# Patient Record
Sex: Male | Born: 1950
Health system: Southern US, Community
[De-identification: ages and names within clinical notes are randomized; demographics above are authoritative.]

## PROBLEM LIST (undated history)

## (undated) DIAGNOSIS — Z9189 Other specified personal risk factors, not elsewhere classified: Secondary | ICD-10-CM

## (undated) DIAGNOSIS — R21 Rash and other nonspecific skin eruption: Secondary | ICD-10-CM

## (undated) DIAGNOSIS — Z8546 Personal history of malignant neoplasm of prostate: Secondary | ICD-10-CM

## (undated) DIAGNOSIS — I10 Essential (primary) hypertension: Secondary | ICD-10-CM

## (undated) DIAGNOSIS — D696 Thrombocytopenia, unspecified: Secondary | ICD-10-CM

## (undated) DIAGNOSIS — Z923 Personal history of irradiation: Secondary | ICD-10-CM

## (undated) DIAGNOSIS — C61 Malignant neoplasm of prostate: Secondary | ICD-10-CM

## (undated) DIAGNOSIS — G629 Polyneuropathy, unspecified: Secondary | ICD-10-CM

## (undated) DIAGNOSIS — D509 Iron deficiency anemia, unspecified: Secondary | ICD-10-CM

## (undated) DIAGNOSIS — H409 Unspecified glaucoma: Secondary | ICD-10-CM

## (undated) DIAGNOSIS — K2980 Duodenitis without bleeding: Secondary | ICD-10-CM

## (undated) DIAGNOSIS — F1911 Other psychoactive substance abuse, in remission: Secondary | ICD-10-CM

## (undated) HISTORY — DX: Rash and other nonspecific skin eruption: R21

## (undated) HISTORY — DX: Other specified personal risk factors, not elsewhere classified: Z91.89

## (undated) MED FILL — Fosaprepitant Dimeglumine For IV Infusion 150 MG (Base Eq): INTRAVENOUS | Qty: 5 | Status: AC

## (undated) MED FILL — Dexamethasone Sodium Phosphate Inj 100 MG/10ML: INTRAMUSCULAR | Qty: 1 | Status: AC

---
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
## 2007-10-22 ENCOUNTER — Ambulatory Visit: Payer: Self-pay | Admitting: Cardiology

## 2007-10-22 ENCOUNTER — Inpatient Hospital Stay (HOSPITAL_COMMUNITY): Admission: EM | Admit: 2007-10-22 | Discharge: 2007-10-31 | Payer: Self-pay | Admitting: *Deleted

## 2007-10-24 ENCOUNTER — Encounter (INDEPENDENT_AMBULATORY_CARE_PROVIDER_SITE_OTHER): Payer: Self-pay | Admitting: Internal Medicine

## 2009-10-27 ENCOUNTER — Emergency Department (HOSPITAL_COMMUNITY): Admission: EM | Admit: 2009-10-27 | Discharge: 2009-10-27 | Payer: Self-pay | Admitting: Emergency Medicine

## 2009-11-25 ENCOUNTER — Ambulatory Visit: Payer: Self-pay | Admitting: Family Medicine

## 2009-11-26 ENCOUNTER — Ambulatory Visit (HOSPITAL_COMMUNITY): Admission: RE | Admit: 2009-11-26 | Discharge: 2009-11-26 | Payer: Self-pay | Admitting: Family Medicine

## 2009-12-16 ENCOUNTER — Encounter (INDEPENDENT_AMBULATORY_CARE_PROVIDER_SITE_OTHER): Payer: Self-pay | Admitting: Family Medicine

## 2009-12-16 ENCOUNTER — Ambulatory Visit: Payer: Self-pay | Admitting: Internal Medicine

## 2009-12-16 LAB — CONVERTED CEMR LAB
ALT: 27 units/L (ref 0–53)
Albumin: 4 g/dL (ref 3.5–5.2)
CO2: 28 meq/L (ref 19–32)
Calcium: 9 mg/dL (ref 8.4–10.5)
Glucose, Bld: 97 mg/dL (ref 70–99)
HCT: 42.9 % (ref 39.0–52.0)
HDL: 57 mg/dL (ref >39)
Lymphocytes Relative: 43 % (ref 12–46)
Lymphs Abs: 1.4 10*3/uL (ref 0.7–4.0)
Monocytes Absolute: 0.4 10*3/uL (ref 0.1–1.0)
Neutro Abs: 1.3 10*3/uL — ABNORMAL LOW (ref 1.7–7.7)
Potassium: 4.8 meq/L (ref 3.5–5.3)
RDW: 15.1 % (ref 11.5–15.5)
Total Protein: 6.9 g/dL (ref 6.0–8.3)
Triglycerides: 109 mg/dL (ref <150)

## 2009-12-17 ENCOUNTER — Ambulatory Visit: Payer: Self-pay | Admitting: Internal Medicine

## 2009-12-24 ENCOUNTER — Ambulatory Visit: Payer: Self-pay | Admitting: Internal Medicine

## 2009-12-24 ENCOUNTER — Encounter (INDEPENDENT_AMBULATORY_CARE_PROVIDER_SITE_OTHER): Payer: Self-pay | Admitting: Family Medicine

## 2009-12-24 LAB — CONVERTED CEMR LAB
Basophils Absolute: 0 10*3/uL (ref 0.0–0.1)
MCHC: 32.3 g/dL (ref 30.0–36.0)
MCV: 86.5 fL (ref 78.0–100.0)
Monocytes Relative: 10 % (ref 3–12)
Neutro Abs: 2.5 10*3/uL (ref 1.7–7.7)
Neutrophils Relative %: 50 % (ref 43–77)
PSA: 32.9 ng/mL — ABNORMAL HIGH (ref 0.10–4.00)
Platelets: 330 10*3/uL (ref 150–400)
RDW: 14.7 % (ref 11.5–15.5)
Sed Rate: 2 mm/hr (ref 0–16)

## 2010-01-08 ENCOUNTER — Ambulatory Visit (HOSPITAL_COMMUNITY): Admission: RE | Admit: 2010-01-08 | Discharge: 2010-01-08 | Payer: Self-pay | Admitting: Family Medicine

## 2010-03-25 ENCOUNTER — Ambulatory Visit: Payer: Self-pay | Admitting: Internal Medicine

## 2010-03-25 ENCOUNTER — Encounter (INDEPENDENT_AMBULATORY_CARE_PROVIDER_SITE_OTHER): Payer: Self-pay | Admitting: Family Medicine

## 2010-03-25 LAB — CONVERTED CEMR LAB: PSA: 35.16 ng/mL — ABNORMAL HIGH (ref 0.10–4.00)

## 2010-05-04 ENCOUNTER — Ambulatory Visit: Payer: Self-pay | Admitting: Internal Medicine

## 2010-09-23 ENCOUNTER — Emergency Department (HOSPITAL_COMMUNITY)
Admission: EM | Admit: 2010-09-23 | Discharge: 2010-09-23 | Payer: Self-pay | Source: Home / Self Care | Admitting: Emergency Medicine

## 2011-02-16 NOTE — H&P (Signed)
NAME:  William Jones, William Jones NO.:  [id number]   MEDICAL RECORD NO.:  [id number]          PATIENT TYPE:  INP   LOCATION:  1824                         FACILITY:  [facility]   PHYSICIAN:  Marcellus Scott, MD     DATE OF BIRTH:  13-Apr-1951   DATE OF ADMISSION:  10/22/2007  DATE OF DISCHARGE:                              HISTORY & PHYSICAL   PRIMARY MEDICAL DOCTOR:  Unassigned.   CHIEF COMPLAINT:  Feeling extremely cold, unable to move, numbness of  the feet.   HISTORY OF PRESENT ILLNESS:  William Jones is a 60 year old African-  American male patient with no past medical history, who says that he got  drunk on alcohol and smoked crack cocaine on the 16th of January pm.  He  indicates that he got so drunk that he does not remember anything since  then, until 8:00 a.m. today.  When he woke up today, he thought it was  the 17th of January morning, not realizing that he had missed an entire  day.  The patient found himself on the uncarpeted floor of his kitchen,  feeling extremely cold and unable to move.  He somehow got to a  telephone and called his daughter and asked her to call 911.  When EMS  arrived at his home, the patient was found on the kitchen floor as he  had indicated, feeling extremely cold and shivering but coherent.  The  patient was subsequently bought to the emergency room, where he was  found to be severely hypothermic with a temperature of 92 degrees  Fahrenheit rectally.  The patient was then placed on a bear hugger, warm  IV fluids and warm O2.  At this time, the patient's temperature is 100.6  degrees Fahrenheit.  Then, according to the emergency room physician's  re-evaluation, the patient's limbs are much better than when he came in,  and he is not shivering.  Patient also indicates there was lack of heat  in his house, of unclear duration.   PAST MEDICAL HISTORY:  None.   PAST SURGICAL HISTORY:  None.   PAST PSYCHIATRIC HISTORY:  None.   ALLERGIES:  NONE.   MEDICATIONS:  None.   FAMILY HISTORY:  The patient's brother, age 38 years, on hemodialysis.   SOCIAL HISTORY:  The patient is widowed and has one child who is at the  bedside.  He lives independently at his house.  He claims he smokes 3 to  4 cigarettes per day, since he was 60 years old.  He also indicates that  he drinks half to one bottle of Vodka only once a week.  The last time  he drank was two days ago.  He also smokes crack cocaine, again last one  was Friday of last week.  He denies using IV drugs or any other drugs of  recreational use.   REVIEW OF SYSTEMS:  Fourteen systems reviewed, and apart from history of  presenting illness, patient indicates some numbness in the feet, but  which is improving.  The patient does not know how he sustained  abrasions on his shins and fingers of the  right hand.   PHYSICAL EXAMINATION:  GENERAL:  [name] is a moderately built  and nourished male patient in no obvious distress.  VITAL SIGNS:  Temperature now is 100.6 degrees Fahrenheit, blood  pressure 132/67 mmHg, pulse 92 per minute, respiration 22 per minute  regular, saturating at 98% on O2.  HEAD, EYES, ENT:  Nontraumatic, normocephalic.  Pupils equally reacting  to light and accommodation.  Bilateral immature cataracts.  Oral cavity  without any oropharyngeal erythema.  NECK:  Supple.  No carotid bruits.  LYMPHATICS:  No lymphadenopathy.  RESPIRATORY:  System with occasional rhonchi, both anteriorly and  posteriorly bilaterally, otherwise clear to auscultation.  CARDIOVASCULAR SYSTEM:  First and second heart sounds heard.  No third  or fourth heart sounds.  Short 2/6 systolic murmur heard in all areas.  ABDOMEN:  Nondistended, nontender.  No organomegaly or masses  appreciated.  Bowel sounds are normally heard.  CENTRAL NERVOUS SYSTEM:  The patient is awake, alert, oriented x3.  No  focal neurological deficits.  EXTREMITIES:  Right hand which is swollen  beyond the wrist, with a  slightly reddish hue but no tenderness, normal temperature.  The patient  has swelling of all of his fingers, with superficial ulcers on the  dorsum of his knuckles with scabbing.  There is a question of clubbing  but no cyanosis.  Left hand was mildly swollen but no other  abnormalities.  Bilateral shins with extensive superficial ulcerations  covered by scabs.  Both feet are still mildly cold, the right greater  than the left.  Peripheral pulses are bounding.  Sensation is slightly  decreased on the right foot but returning.  The patient is able to move  both feet and toes.  SKIN:  Apart from the above mentioned, no other abnormalities.  MUSCULOSKELETAL:  Not significant.   LABORATORY DATA:  Comprehensive metabolic panel:  Sodium 144, potassium  4.7, chloride 109, bicarb 17, glucose 74, BUN 41, creatinine 1.87, total  bilirubin of 1.5, total alkaline phosphatase 77, AST 489, ALT 156, total  protein 6.6, albumin 3.6, calcium 8, INR of 1.3, CBC with hemoglobin  15.5, hematocrit 46.2, white blood cell 18.8, platelets 216, point of  care cardiac markers with CK-MB greater than 80, troponin-I 0.07,  myoglobin 107, urine microscopy with 3 to 6 white blood cell per high-  powered field, 7 to 10 per high-powered field red blood cells, a large  amount of blood and 15 ketones.  Chest x-ray:  No abnormality detected, COPD changes.  I have reviewed  the x-rays.  EKGwith normal sinus rhythm at 94 beats per minute with voltage criteria  for left ventricular hypertrophy and tall T-waves/Jwaves V2 to V4.  The  PR interval is 142 and QTC is 469.   ASSESSMENT/PLAN:  1. Severe hypothermia.  Probably multifactorial related to:  lack of      heating at home, alcohol intoxication and cocaine abuse.  Will      admit the patient to telemetry.  Will place bear hugger p.r.n.      Will provide warm O2 and IV fluids.  There is no clinical focus of      sepsis.  Will, however, check  blood culture, urine culture.  Will      also check TSH, urine for toxicology, and alcohol levels.  2. History of alcohol abuse.  Will monitor for withdrawal and place on      alcohol withdrawal protocol, thiamine and multivitamins.  3. Cocaine abuse for counseling.  4. Tobacco abuse.  For counseling and for nicotine patch.  5. Acute renal failure.  This is probably secondary to dehydration and      question rhabdomyolysis-for aggressive IV fluid hydration and      follow up of serial basic metabolic panel.  6. Hepatitis - probably alcoholic hepatitis versus secondary to      rhabdomyolysis.  Will check hepatitis panel and follow serial LFTs.  7. Mild coagulopathy, rule out cirrhosis.  No bleeding, however.  8. Leukocytosis.  Probably stress related and no clinical focus of      sepsis.  Will hold antibiotics and follow CBCs.  9. Question rhabdomyolysis.  Will check total CK and hydrate with IV      fluids and monitor muscle enzymes.  10.Frost bite of b/l toes and right fingers and pernio of both shins-      management per 1 and wound care.      Marcellus Scott, MD  Electronically Signed     AH/MEDQ  D:  10/22/2007  T:  10/22/2007  Job:  [id number]

## 2011-02-16 NOTE — Consult Note (Signed)
NAME:  William Jones, William Jones NO.:  [id number]   MEDICAL RECORD NO.:  [id number]          PATIENT TYPE:  INP   LOCATION:  4729                         FACILITY:  [facility]   PHYSICIAN:  Madlyn Frankel. Charlann Boxer, M.D.  DATE OF BIRTH:  01/31/1951   DATE OF CONSULTATION:  10/23/2007  DATE OF DISCHARGE:                                 CONSULTATION   REASON FOR CONSULTATION:  Evaluate for cold exposure in bilateral lower  extremities.   BRIEF HISTORY:  William Jones is a 60 year old gentleman with no real  known medical history other than the potential for alcohol and illicit  drug use.  He was apparently found by his daughter this week and after  residing in his home without any heat.  Apparently she had not heard  from him in a couple of days and followed up with him and found him  sitting in the house against the wall.  She does not know how long that  had been going on.  He was brought to the emergency room, evaluated and  admitted to the medical service for evaluation and treatment of cold  exposure.   PAST MEDICAL HISTORY:  Review of his past medical history reveals no  significant pathology.   SURGICAL HISTORY:  None reported.   ALLERGIES:  None.   CURRENT MEDICATIONS:  None.   FAMILY HISTORY:  Reports that he has a brother who had coronary artery  disease.   SOCIAL HISTORY:  He is widowed with one child per their report. However,  his daughters is in the room who states that she lives with her mom.  He  lives independently, obviously in an environment that is probably not  the best suitable environment with no heat.  He smokes cigarettes and  drinks vodka and apparently was smoking some crack cocaine.   PHYSICAL EXAMINATION:  GENERAL:  He is awake, alert and oriented.  He is  currently found to be afebrile with stable vital signs.  In the bed.  He  was just finishing a breathing treatment, and though he responded to  questions appropriately and honestly,  was not very  awake.  His daughter  did most of the history recording as best she could ascertain.  He did  not report severe pain, reports intact sensibility in his upper and  lower extremities.   Observation examination indicates that he has multiple areas of  abrasions or eschar over the lower and upper extremities.  He has  bounding pulses palpable bilateral upper and lower extremities.  His  toes on the left are a little bit cold and on the right.  There is no  significant discoloration of skin and definitely no blackness.  No  evidence of any dry or wet gangrene at this point.  He is wearing socks  on initial evaluation.  Examination of the toes was without the socks  obviously. He otherwise was under warm blanket.   Review of his labs indicate that his white count was 18.8 with a  hematocrit of 46.2.  His electrolytes indicated sodium 134, potassium  4.2, BUN 48, creatinine elevated to 2.04 with  a glucose of 201.  He had  markedly elevated CK-MB with a mildly elevated troponin which is  currently being worked up.  It is possible that his CK and CK-MB values  were due to muscle damage due to chronic shivering from cold exposure.   RADIOGRAPHS:  None taken.   ASSESSMENT:  Bilateral lower extremity cold exposure; rule out frost  bite.   PLAN:  At this point he is not having any acute pathology that needs to  be addressed.  I reviewed with his primary care physician the medical  issues to consider which are currently all appropriately taken care of.  As for his feet, there is no acute pathology that needs to be addressed.  Even if there were some delayed progression of some frostbite issues,  not until it is fully demarcated out or some other issues related to  chronic infection or something, would this need to be addressed.  Instead, I reviewed this pathology with the wound care center who  recommended the patient follow up with them for potential demarcation if  there is anything that  begins to change while in the hospital.   Otherwise, management of cold exposure in terms of heated fluids,  maintaining high fluid rate based on his potential burden for muscle  damage, warm socks and blanket covers extremities.  Otherwise, this can  be observed.   I also spoke with the patient's family who live in [place]. They may  be interested in taking him back to [place] for some time to allow him  to be in an environment that may be more suitable and more healthy.  Questions encouraged and answered.  If further questions arise, we can  be contacted. Otherwise follow up in the wound care clinic at [facility].      Madlyn Frankel Charlann Boxer, M.D.  Electronically Signed     MDO/MEDQ  D:  10/23/2007  T:  10/23/2007  Job:  [id number]

## 2011-02-16 NOTE — Discharge Summary (Signed)
NAME:  William Jones, William Jones NO.:  [id number]   MEDICAL RECORD NO.:  [id number]          PATIENT TYPE:  INP   LOCATION:  4737                         FACILITY:  [facility]   PHYSICIAN:  Ladell Pier, M.D.   DATE OF BIRTH:  1951/09/25   DATE OF ADMISSION:  10/22/2007  DATE OF DISCHARGE:  10/30/2007                               DISCHARGE SUMMARY   DISCHARGE DIAGNOSES:  1. Rhabdomyolysis.  2. Acute renal failure.  3. Crack cocaine and alcohol abuse.  4. Elevated troponin.  5. Hypothermia.  6. Abnormal liver function tests/hepatitis.  7. Leukocytosis  8. Frostbitten toes bilateral lower extremity.  9. Thrombocytopenia.  10.Altered mental status.  11.Dehydration.  12.Hyperglycemia.   CONSULTANT:  1. Cardiology secondary to elevated cardiac enzymes.  2. Orthopedic secondary to frostbitten toes.  3. Wound care secondary to frostbitten toes.   PROCEDURES:  None.   HISTORY OF PRESENT ILLNESS:  The patient is a 60 year old African  American male without any prior medical history except polysubstance  abuse.  He was using crack cocaine and alcohol on January 16, woke on  the morning of the 18th.  No recollection of January 17.  He woke up  feeling extremely cold and unable to move.  He somehow got to a  telephone and called his daughter, and she then call 9-1-1.  When EMS  arrived, the patient was on the floor.  He was taken to [facility] ED  where his core temperature was 92, was treated a bear hugger, warm IV  fluids and warm oxygen.  He had a marked elevation in CK and MB 57,879,  MB 533, mild elevation of troponin, was treated for rhabdo and  hypothermia along with acute renal failure aggressively with fluids.  Please see admission note for remainder of history, past medical  history, family history, social history, medications, allergies, review  of systems, per admission H&P.   PHYSICAL EXAMINATION ON DISCHARGE:  Temperature 98.7, pulse 88,  respirations 18,  blood pressure 115/79, pulse oxygen 94% on room air.  CBG 109-120.  HEENT: Normocephalic, atraumatic.  Pupils reactive to light, throat  without erythema.  CARDIOVASCULAR:  Regular rate and rhythm.  LUNGS:  Clear bilaterally.  ABDOMEN:  Positive bowel sounds.  EXTREMITIES:  1+ edema.  He has frost dark, purple-looking toes from  frostbite with some area that is excoriated.   HOSPITAL COURSE:  1. Hypothermia:  The patient was admitted with a bear hugger.      Temperature improved.  2. Altered mental status:  Altered mental status is multifactorial.      With the treatment of his hypothermia and his rhabdo, his mental      status improved, and he was able to answer questions appropriately      over the few days in the hospital.  3. Rhabdomyolysis:  He was treated with IV fluids.  Over the hospital      course, his CK levels trended down to less than 2000.  He will      follow up with [facility] to continue to monitor his      CKs.  4. Abnormal  LFTs:  His liver function was also very elevated and also      continued to trend down.  His levels have gotten pretty low, and he      will follow up outpatient to recheck them in the next few weeks.  5. Elevated cardiac enzymes:  This is also secondary to him being down      in the cold.  His apartment did not have any heat.  Not sure how      long he was down for.  Cardiology saw him and did not think he      needed any further cardiac workup.  6. Frostbitten toes:  His toes were frostbitten.  Ortho was consulted      and did not think there was anything to be done.  Recommend wound      care clinic followup.  7. Disposition:  The patient has nowhere to go.  Care manager will      arrange for the patient to go to a shelter.  He has an appointment      to follow up with the wound care center, and he will also follow up      with [facility].  He will call [facility] for appointment.   DISCHARGE LABS:  CK 1179, MB 4.5, relative  index 0.4, troponin 0.07.  LFTs:  AST 100, ALT 159, sodium 134, potassium 3.5, chloride 102, CO2  25, glucose 108, BUN 11, creatinine 0.86.  CT of the head showed normal  brain for age.      Ladell Pier, M.D.  Electronically Signed     NJ/MEDQ  D:  10/30/2007  T:  10/30/2007  Job:  [id number]   cc:   [facility]  [facility]

## 2011-02-16 NOTE — Consult Note (Signed)
NAME:  William Jones, RAMUS NO.:  [id number]   MEDICAL RECORD NO.:  [id number]          PATIENT TYPE:  INP   LOCATION:  4729                         FACILITY:  [facility]   PHYSICIAN:  Jesse Sans. Wall, MD, FACCDATE OF BIRTH:  November 23, 1950   DATE OF CONSULTATION:  10/23/2007  DATE OF DISCHARGE:                                 CONSULTATION   PRIMARY CARDIOLOGIST:  New to [facility] cardiology, being seen by Dr.  Daleen Squibb.   PRIMARY Kohle Winner:  The patient has no primary providers, is on the  [employer].   PATIENT PROFILE:  A 60 year old African male without prior cardiac  history by report, who was admitted in the setting of hypothermia with  EtOH and crack cocaine abuse and subsequently noted to have a  rhabdomyolysis, acute renal failure and elevated cardiac markers.   PROBLEM LIST:  1. Hypothermia.  2. Rhabdomyolysis.  3. Elevated troponin.  4. Cocaine abuse.  5. Tobacco abuse.  6. EtOH abuse.  7. Acute renal failure in the setting of rhabdomyolysis.  8. Hepatitis.  9. Leukocytosis.   ALLERGIES:  No known drug allergies.   HISTORY OF PRESENT ILLNESS:  A 60 year old African male without prior  medical history with the exception of polysubstance abuse.  He  apparently used crack cocaine and alcohol on January 16 and awoke on the  morning of January 18 (the patient has no recollection of January 17)  feeling extremely cold and unable to move.  He somehow got to a  telephone and called his daughter and she then called 9-1-1.  When EMS  arrived, the patient was on his occasion floor and was shivering but  coherent.  Apparently there was no heat in the home.  He was taken to  the [facility] ED, where his core temperature was 92 degrees Fahrenheit  and he was treated with the [facility], warm IV fluids and warm O2.  He  has subsequently been noted to have marked elevations in CKs and MBs to  a peak of 57,879 and 533.2, respectively, with an index of less than  1  and mild elevations of troponins to a peak of 58.79.  He has been treated  for rhabdomyolysis and hypothermia along with acute renal failure with  aggressive fluid hydration.  He is currently on DT prophylaxis secondary  to his alcoholism and is somewhat sedated.  He does open his eyes and  answer simple yes-no questions but nods off fairly quickly.  He denies  experiencing any pain at this time..   CURRENT MEDICATIONS:  1. Albuterol inhaler q.6h.  2. Atrovent inhaler q.6h.  3. Lorazepam 2 mg IV q.8h.  4. Multivitamin daily.  5. Nicotine patch 14 mg daily.  6. Thiamine 100 mg daily.   FAMILY HISTORY:  Mother has a history hypertension.  Father died of an  MI in his 22s.  He has a brother in his 64s who has a history renal  failure on dialysis.   SOCIAL HISTORY:  He lives alone in [place].  He is unemployed.  He is  apparently widowed.  He does have one daughter.  He  has a history of  EtOH, crack cocaine and tobacco abuse.   REVIEW OF SYSTEMS:  The patient is sedated 58 year old daughter and unable to answer these  questions, and this should be noted that the family history and social  history were obtained from the patient's 34 year old daughter.   PHYSICAL EXAM:  Temperature 99.2, heart rate 107, respirations 21, blood  pressure 108/66, pulse oximetry 97% on 2 L.  He weighed 63.3 kg.  A pleasant African American male in no acute distress.  He is somnolent  and opens his eyes to answer yes-no questions but then falls back to  sleep.  HEENT:  Normal.  SKIN:  He has multiple areas of what appears to be necrosis over his  fingers and hands bilaterally with significant hand swelling.  He has  similar lesions on the anterior aspects of his ankles and feet, felt to  be associated with his frostbite.  NECK:  No bruits or JVD.  LUNGS:  Respirations regular are unlabored.  Clear to auscultation.  CARDIAC:  Regular, S1, S2, no S3, S4 or murmurs.  ABDOMEN:  Flat, soft, nontender.  Bowel sounds present  x4.  EXTREMITIES:  Warm, dry.  No clubbing, cyanosis, with edema as outlined  above.   Chest x-ray shows no active lung disease, probable COPD.  EKG shows  sinus rhythm, rate of 94 beats per minute.  He appears have LVH and  possible Osborne waves in V4, V5 and V6.   Lab work:  Hemoglobin 15.5, hematocrit 46.2, WBC 18.8, platelets 216.  Sodium 34, potassium 4.2, chloride 107, CO2 19, BUN 48, creatinine 2.04,  glucose 201, total bilirubin 1.0, alkaline phosphatase 68, AST 790, ALT  230, total protein 6.0, albumin 3.0, calcium 7.5.  Phosphorus 4.4.  Magnesium 2.4.  CK 57,879, MB 532.2, troponin-I 0.34.  Urine culture is  pending.   ASSESSMENT AND PLAN:  1. Troponin elevation.  This has occurred in the setting of      rhabdomyolysis with acute renal failure and crack cocaine and      ethanol abuse.  His CKs and MBs are markedly elevated; however, his      index is normal and at this point we doubt ischemia.  We will check      a 2-D echocardiogram to evaluate left ventricular function and wall      motion.  Provided that echo is normal, we would not pursue      additional cardiac evaluation at this time.  2. Polysubstance abuse.  Cessation advised.  When the patient is more      awake and alert, he will require additional counseling.  3. Rhabdomyolysis.  Per primary team.  4. Hypothermia.  Per primary team.  5. Acute renal failure, likely secondary to rhabdomyolysis.  The      patient is being hydrated.      Nicolasa Ducking, ANP      Jesse Sans. Daleen Squibb, MD, [facility]  Electronically Signed    CB/MEDQ  D:  10/23/2007  T:  10/24/2007  Job:  [id number]

## 2011-06-25 LAB — CBC
HCT: 31.7 — ABNORMAL LOW
HCT: 46.2
Hemoglobin: 10.7 — ABNORMAL LOW
Hemoglobin: 11.5 — ABNORMAL LOW
Hemoglobin: 12.1 — ABNORMAL LOW
Hemoglobin: 15.5
MCHC: 33.3
MCHC: 33.6
MCV: 83.9
MCV: 84.3
Platelets: 216
Platelets: 238
Platelets: 303
RBC: 4.12 — ABNORMAL LOW
RBC: 4.24
RBC: 4.73
RBC: 5.5
RDW: 14.8
RDW: 14.9
WBC: 18.8 — ABNORMAL HIGH
WBC: 6.4
WBC: 7.9
WBC: 8.8

## 2011-06-25 LAB — COMPREHENSIVE METABOLIC PANEL WITH GFR
ALT: 230 — ABNORMAL HIGH
AST: 790 — ABNORMAL HIGH
Albumin: 3 — ABNORMAL LOW
Alkaline Phosphatase: 68
BUN: 48 — ABNORMAL HIGH
CO2: 19
Calcium: 7.6 — ABNORMAL LOW
Chloride: 107
Creatinine, Ser: 2.04 — ABNORMAL HIGH
GFR calc non Af Amer: 34 — ABNORMAL LOW
Glucose, Bld: 201 — ABNORMAL HIGH
Potassium: 4.2
Sodium: 134 — ABNORMAL LOW
Total Bilirubin: 1
Total Protein: 6

## 2011-06-25 LAB — URINE MICROSCOPIC-ADD ON

## 2011-06-25 LAB — DIFFERENTIAL
Basophils Absolute: 0
Basophils Relative: 0
Eosinophils Absolute: 0
Eosinophils Relative: 0
Lymphocytes Relative: 4 — ABNORMAL LOW
Lymphs Abs: 0.8
Monocytes Absolute: 1.4 — ABNORMAL HIGH
Monocytes Relative: 7
Neutro Abs: 16.6 — ABNORMAL HIGH
Neutrophils Relative %: 88 — ABNORMAL HIGH

## 2011-06-25 LAB — CARDIAC PANEL(CRET KIN+CKTOT+MB+TROPI)
CK, MB: 19 — ABNORMAL HIGH
CK, MB: 315.2 — ABNORMAL HIGH
CK, MB: 533.2 — ABNORMAL HIGH
CK, MB: 61.2 — ABNORMAL HIGH
Relative Index: 0.1
Relative Index: 0.2
Relative Index: 0.4
Total CK: 2614 — ABNORMAL HIGH
Total CK: 30012 — ABNORMAL HIGH
Total CK: 57405 — ABNORMAL HIGH
Total CK: 57879 — ABNORMAL HIGH
Troponin I: 0.04
Troponin I: 0.07 — ABNORMAL HIGH

## 2011-06-25 LAB — BASIC METABOLIC PANEL
BUN: 29 — ABNORMAL HIGH
BUN: 8
CO2: 27
CO2: 28
Calcium: 5.7 — CL
Calcium: 6 — CL
Calcium: 7.7 — ABNORMAL LOW
Calcium: 8.2 — ABNORMAL LOW
Chloride: 100
Chloride: 100
Chloride: 103
Chloride: 77 — CL
Chloride: 81 — ABNORMAL LOW
Creatinine, Ser: 0.84
Creatinine, Ser: 0.87
Creatinine, Ser: 0.91
GFR calc Af Amer: >60
GFR calc Af Amer: >60
GFR calc Af Amer: >60
GFR calc non Af Amer: >60
Glucose, Bld: 139 — ABNORMAL HIGH
Glucose, Bld: 208 — ABNORMAL HIGH
Glucose, Bld: 212 — ABNORMAL HIGH
Potassium: 3.9
Potassium: 4
Potassium: 4.1
Sodium: 131 — ABNORMAL LOW
Sodium: 131 — ABNORMAL LOW
Sodium: 132 — ABNORMAL LOW
Sodium: 135

## 2011-06-25 LAB — URINE CULTURE: Colony Count: NO GROWTH

## 2011-06-25 LAB — COMPREHENSIVE METABOLIC PANEL
ALT: 237 — ABNORMAL HIGH
ALT: 240 — ABNORMAL HIGH
ALT: 247 — ABNORMAL HIGH
ALT: 269 — ABNORMAL HIGH
ALT: 292 — ABNORMAL HIGH
AST: 470 — ABNORMAL HIGH
AST: 634 — ABNORMAL HIGH
AST: 647 — ABNORMAL HIGH
Alkaline Phosphatase: 65
Alkaline Phosphatase: 77
Alkaline Phosphatase: 82
Alkaline Phosphatase: 84
BUN: 11
BUN: 41 — ABNORMAL HIGH
CO2: 25
CO2: 25
CO2: 29
CO2: 30
Calcium: 8.8
Chloride: 101
Chloride: 98
Chloride: 99
Creatinine, Ser: 1.03
GFR calc Af Amer: 46 — ABNORMAL LOW
GFR calc Af Amer: >60
GFR calc Af Amer: >60
GFR calc Af Amer: >60
GFR calc non Af Amer: >60
GFR calc non Af Amer: >60
GFR calc non Af Amer: >60
GFR calc non Af Amer: >60
Glucose, Bld: 103 — ABNORMAL HIGH
Glucose, Bld: 108 — ABNORMAL HIGH
Glucose, Bld: 74
Glucose, Bld: 96
Potassium: 3.5
Potassium: 4.3
Potassium: 4.5
Potassium: 4.7
Sodium: 134 — ABNORMAL LOW
Sodium: 135
Sodium: 135
Sodium: 137
Total Bilirubin: 0.6
Total Bilirubin: 1
Total Bilirubin: 1.1
Total Protein: 6.3
Total Protein: 6.3
Total Protein: 6.6

## 2011-06-25 LAB — URINALYSIS, ROUTINE W REFLEX MICROSCOPIC
Bilirubin Urine: NEGATIVE
Glucose, UA: NEGATIVE
Ketones, ur: 15 — AB
Leukocytes, UA: NEGATIVE
Nitrite: NEGATIVE
Protein, ur: 100 — AB
Specific Gravity, Urine: 1.02
Urobilinogen, UA: 1
pH: 5.5

## 2011-06-25 LAB — APTT: aPTT: 33

## 2011-06-25 LAB — PROTIME-INR
INR: 1
INR: 1.3
Prothrombin Time: 16.5 — ABNORMAL HIGH

## 2011-06-25 LAB — MAGNESIUM: Magnesium: 2.4

## 2011-06-25 LAB — CK TOTAL AND CKMB (NOT AT ARMC): Relative Index: 1.4

## 2011-06-25 LAB — CK: Total CK: 28236 — ABNORMAL HIGH

## 2011-06-25 LAB — PHOSPHORUS
Phosphorus: 1.1 — ABNORMAL LOW
Phosphorus: 4.4

## 2011-06-25 LAB — HEPATIC FUNCTION PANEL
ALT: 159 — ABNORMAL HIGH
AST: 100 — ABNORMAL HIGH
Albumin: 2 — ABNORMAL LOW
Alkaline Phosphatase: 77
Total Protein: 6.4

## 2011-06-25 LAB — POCT CARDIAC MARKERS
CKMB, poc: >80
Myoglobin, poc: 107
Operator id: 196461
Troponin i, poc: 0.07 — ABNORMAL HIGH

## 2011-06-25 LAB — CALCIUM: Calcium: 7.5 — ABNORMAL LOW

## 2011-06-25 LAB — LACTIC ACID, PLASMA: Lactic Acid, Venous: 0.9

## 2011-11-30 ENCOUNTER — Encounter: Payer: Self-pay | Admitting: Vascular Surgery

## 2011-12-01 ENCOUNTER — Encounter (INDEPENDENT_AMBULATORY_CARE_PROVIDER_SITE_OTHER): Payer: Medicaid Other | Admitting: *Deleted

## 2011-12-01 ENCOUNTER — Ambulatory Visit (INDEPENDENT_AMBULATORY_CARE_PROVIDER_SITE_OTHER): Payer: Medicaid Other | Admitting: Vascular Surgery

## 2011-12-01 ENCOUNTER — Encounter: Payer: Self-pay | Admitting: Vascular Surgery

## 2011-12-01 VITALS — BP 112/74 | HR 68 | Resp 16 | Ht 69.0 in | Wt 154.0 lb

## 2011-12-01 DIAGNOSIS — R0989 Other specified symptoms and signs involving the circulatory and respiratory systems: Secondary | ICD-10-CM

## 2011-12-01 DIAGNOSIS — M79673 Pain in unspecified foot: Secondary | ICD-10-CM

## 2011-12-01 DIAGNOSIS — M79609 Pain in unspecified limb: Secondary | ICD-10-CM

## 2011-12-01 NOTE — Progress Notes (Signed)
[facility]   Patient name: William Jones MRN: [id number] DOB: December 14, 1950 Sex: male  REASON FOR CONSULT: bilateral foot pain. Referred by Clarisa Fling  HPI: William Jones is a 61 y.o. male who is had a long history of pain in both feet. This likely began when he developed frostbite in 2009. He had lost to heat at his home and on a very cold night experienced a frostbite of both feet. He said fairly constant throbbing pain in his feet since that time. He states there really no aggravating or alleviating factors. I do not get any history of claudication, rest pain, or nonhealing wounds. The pain is most significant in his left first and second toes but he has pain in both feet.  Past Medical History  Diagnosis Date  . History of frostbite     History reviewed. No pertinent family history.  SOCIAL HISTORY: History  Substance Use Topics  . Smoking status: Former Smoker    Types: Cigarettes    Quit date: 10/05/2007  . Smokeless tobacco: Not on file  . Alcohol Use: No    No Known Allergies  Current Outpatient Prescriptions  Medication Sig Dispense Refill  . gabapentin (NEURONTIN) 300 MG capsule Take 300 mg by mouth 3 (three) times daily.        REVIEW OF SYSTEMS: Arly.Keller ] denotes positive finding; [  ] denotes negative finding  CARDIOVASCULAR:  [ ]  chest pain   [ ]  chest pressure   [ ]  palpitations   [ ]  orthopnea   [ ]  dyspnea on exertion   [ ]  claudication   [ ]  rest pain   [ ]  DVT   [ ]  phlebitis PULMONARY:   [ ]  productive cough   [ ]  asthma   [ ]  wheezing NEUROLOGIC:   [ ]  weakness  [ ]  paresthesias  [ ]  aphasia  [ ]  amaurosis  [ ]  dizziness HEMATOLOGIC:   [ ]  bleeding problems   [ ]  clotting disorders MUSCULOSKELETAL:  [ ]  joint pain   [ ]  joint swelling [ ]  leg swelling GASTROINTESTINAL: [ ]   blood in stool  [ ]   hematemesis GENITOURINARY:  [ ]   dysuria  [ ]   hematuria PSYCHIATRIC:  [ ]  history of major depression INTEGUMENTARY:  [  ] rashes  [ ]  ulcers CONSTITUTIONAL:  [ ]  fever   [ ]  chills  PHYSICAL EXAM: Filed Vitals:   12/01/11 1122  BP: 112/74  Pulse: 68  Resp: 16  Height: 5\' 9"  (1.753 m)  Weight: 154 lb (69.854 kg)  SpO2: 100%   Body mass index is 22.74 kg/(m^2). GENERAL: The patient is a well-nourished male, in no acute distress. The vital signs are documented above. CARDIOVASCULAR: There is a regular rate and rhythm without significant murmur appreciated. I do not detect carotid bruits. He has palpable femoral, popliteal, and dorsalis pedis pulses bilaterally. He has no significant lower extremity swelling. PULMONARY: There is good air exchange bilaterally without wheezing or rales. ABDOMEN: Soft and non-tender with normal pitched bowel sounds. I cannot palpate an abdominal aortic aneurysm. MUSCULOSKELETAL: There are no major deformities or cyanosis. NEUROLOGIC: No focal weakness or paresthesias are detected. SKIN: There are no ulcers or rashes noted. PSYCHIATRIC: The patient has a normal affect.  DATA:  I have independently interpreted his arterial Doppler study which shows triphasic Doppler signals in the dorsalis pedis and posterior tibial positions bilaterally. He has an ABI of over 100% bilaterally.  MEDICAL ISSUES:  I reassured him that by exam and by his Doppler study he has no evidence of significant peripheral arterial disease. I think he has neuropathy in both feet related to his frostbite. He is on Neurontin which hopefully will help. I'll be happy to see him back at any time if any new vascular issues arise.   William Jones S [facility] Beeper: [phone]

## 2013-07-16 ENCOUNTER — Other Ambulatory Visit: Payer: Self-pay

## 2013-07-16 DIAGNOSIS — M792 Neuralgia and neuritis, unspecified: Secondary | ICD-10-CM

## 2013-07-16 NOTE — Telephone Encounter (Signed)
Refill Gabapentin request.

## 2013-07-17 ENCOUNTER — Other Ambulatory Visit: Payer: Self-pay

## 2013-07-17 DIAGNOSIS — M792 Neuralgia and neuritis, unspecified: Secondary | ICD-10-CM

## 2013-07-17 MED ORDER — GABAPENTIN 300 MG PO CAPS: 300.0000 mg | ORAL_CAPSULE | Freq: Three times a day (TID) | ORAL | Status: DC

## 2013-09-14 ENCOUNTER — Ambulatory Visit: Payer: Self-pay

## 2013-10-05 ENCOUNTER — Ambulatory Visit: Payer: Self-pay

## 2013-10-19 ENCOUNTER — Ambulatory Visit: Payer: Self-pay

## 2013-11-06 ENCOUNTER — Ambulatory Visit (INDEPENDENT_AMBULATORY_CARE_PROVIDER_SITE_OTHER): Payer: Medicare Other

## 2013-11-06 VITALS — BP 119/64 | HR 112 | Resp 18

## 2013-11-06 DIAGNOSIS — E1149 Type 2 diabetes mellitus with other diabetic neurological complication: Secondary | ICD-10-CM

## 2013-11-06 DIAGNOSIS — B351 Tinea unguium: Secondary | ICD-10-CM

## 2013-11-06 DIAGNOSIS — E1140 Type 2 diabetes mellitus with diabetic neuropathy, unspecified: Secondary | ICD-10-CM

## 2013-11-06 DIAGNOSIS — Q828 Other specified congenital malformations of skin: Secondary | ICD-10-CM | POA: Diagnosis not present

## 2013-11-06 DIAGNOSIS — E1142 Type 2 diabetes mellitus with diabetic polyneuropathy: Secondary | ICD-10-CM

## 2013-11-06 DIAGNOSIS — M79609 Pain in unspecified limb: Secondary | ICD-10-CM | POA: Diagnosis not present

## 2013-11-06 NOTE — Patient Instructions (Signed)
Diabetes and Foot Care Diabetes may cause you to have problems because of poor blood supply (circulation) to your feet and legs. This may cause the skin on your feet to become thinner, break easier, and heal more slowly. Your skin may become dry, and the skin may peel and crack. You may also have nerve damage in your legs and feet causing decreased feeling in them. You may not notice minor injuries to your feet that could lead to infections or more serious problems. Taking care of your feet is one of the most important things you can do for yourself.  HOME CARE INSTRUCTIONS  Wear shoes at all times, even in the house. Do not go barefoot. Bare feet are easily injured.  Check your feet daily for blisters, cuts, and redness. If you cannot see the bottom of your feet, use a mirror or ask someone for help.  Wash your feet with warm water (do not use hot water) and mild soap. Then pat your feet and the areas between your toes until they are completely dry. Do not soak your feet as this can dry your skin.  Apply a moisturizing lotion or petroleum jelly (that does not contain alcohol and is unscented) to the skin on your feet and to dry, brittle toenails. Do not apply lotion between your toes.  Trim your toenails straight across. Do not dig under them or around the cuticle. File the edges of your nails with an emery board or nail file.  Do not cut corns or calluses or try to remove them with medicine.  Wear clean socks or stockings every day. Make sure they are not too tight. Do not wear knee-high stockings since they may decrease blood flow to your legs.  Wear shoes that fit properly and have enough cushioning. To break in new shoes, wear them for just a few hours a day. This prevents you from injuring your feet. Always look in your shoes before you put them on to be sure there are no objects inside.  Do not cross your legs. This may decrease the blood flow to your feet.  If you find a minor scrape,  cut, or break in the skin on your feet, keep it and the skin around it clean and dry. These areas may be cleansed with mild soap and water. Do not cleanse the area with peroxide, alcohol, or iodine.  When you remove an adhesive bandage, be sure not to damage the skin around it.  If you have a wound, look at it several times a day to make sure it is healing.  Do not use heating pads or hot water bottles. They may burn your skin. If you have lost feeling in your feet or legs, you may not know it is happening until it is too late.  Make sure your health care provider performs a complete foot exam at least annually or more often if you have foot problems. Report any cuts, sores, or bruises to your health care provider immediately. SEEK MEDICAL CARE IF:   You have an injury that is not healing.  You have cuts or breaks in the skin.  You have an ingrown nail.  You notice redness on your legs or feet.  You feel burning or tingling in your legs or feet.  You have pain or cramps in your legs and feet.  Your legs or feet are numb.  Your feet always feel cold. SEEK IMMEDIATE MEDICAL CARE IF:   There is increasing redness,   swelling, or pain in or around a wound.  There is a red line that goes up your leg.  Pus is coming from a wound.  You develop a fever or as directed by your health care provider.  You notice a bad smell coming from an ulcer or wound. Document Released: 09/17/2000 Document Revised: 05/23/2013 Document Reviewed: 02/27/2013 [employer] Patient Information 2014 [employer].  

## 2013-11-06 NOTE — Progress Notes (Signed)
   Subjective:    Patient ID: William Jones, male    DOB: August 09, 1951, 63 y.o.   MRN: [id number]  HPI I need my toenails trimmed up today on both feet    Review of Systems no new changes or findings noted     Objective:   Physical Exam Vascular status is intact as follows DP +2/4 bilateral PT thready at one over 4 bilateral Refill time 3 seconds all digits skin temperature warm to cool turgor diminished there is +1 edema noted no rubor pallor no varicosities noted. Neurologically epicritic and proprioceptive sensations intact although diminished on Semmes Weinstein testing bilateral. There is normal plantar response DTRs not elicited. Dermatologically skin color pigment normal hair growth absent nails thick brittle crumbly discolored and friable 1 through 5 bilateral painful tender and symptomatic with ambulation and include shoe wear. There is also multiple keratoses pinch callus of the left hallux IP and MTP joint. Also distal clavus second and fourth left. Associated with hammertoe deformities. No open wounds ulcerations no secondary infections at current time.       Assessment & Plan:  Assessment diabetes with history peripheral neuropathy and some angiopathy. This time multiple keratoses debrided distal tuft second and fourth left and pinch callus first left. Also debridement of nails thick brittle friable criptotic discolored nails 1 through 5 bilateral return for future palliative care is needed 3 months recommendation is given  Harriet Masson DPM

## 2014-01-08 ENCOUNTER — Encounter (HOSPITAL_COMMUNITY): Payer: Self-pay | Admitting: Emergency Medicine

## 2014-01-08 ENCOUNTER — Emergency Department (INDEPENDENT_AMBULATORY_CARE_PROVIDER_SITE_OTHER): Payer: Medicare Other

## 2014-01-08 ENCOUNTER — Inpatient Hospital Stay (HOSPITAL_COMMUNITY)
Admission: EM | Admit: 2014-01-08 | Discharge: 2014-01-11 | DRG: 812 | Disposition: A | Discharge status: Home / Self Care | Payer: Medicare Other | Attending: Internal Medicine | Admitting: Internal Medicine

## 2014-01-08 ENCOUNTER — Emergency Department (INDEPENDENT_AMBULATORY_CARE_PROVIDER_SITE_OTHER)
Admission: EM | Admit: 2014-01-08 | Discharge: 2014-01-08 | Disposition: A | Discharge status: Nursing Home (Includes ICF) | Payer: Medicare Other | Source: Home / Self Care

## 2014-01-08 DIAGNOSIS — R0989 Other specified symptoms and signs involving the circulatory and respiratory systems: Secondary | ICD-10-CM

## 2014-01-08 DIAGNOSIS — R0600 Dyspnea, unspecified: Secondary | ICD-10-CM

## 2014-01-08 DIAGNOSIS — Z79899 Other long term (current) drug therapy: Secondary | ICD-10-CM

## 2014-01-08 DIAGNOSIS — F1210 Cannabis abuse, uncomplicated: Secondary | ICD-10-CM | POA: Diagnosis present

## 2014-01-08 DIAGNOSIS — R0602 Shortness of breath: Secondary | ICD-10-CM

## 2014-01-08 DIAGNOSIS — R0789 Other chest pain: Secondary | ICD-10-CM

## 2014-01-08 DIAGNOSIS — D473 Essential (hemorrhagic) thrombocythemia: Secondary | ICD-10-CM | POA: Diagnosis present

## 2014-01-08 DIAGNOSIS — J449 Chronic obstructive pulmonary disease, unspecified: Secondary | ICD-10-CM

## 2014-01-08 DIAGNOSIS — F1010 Alcohol abuse, uncomplicated: Secondary | ICD-10-CM | POA: Diagnosis present

## 2014-01-08 DIAGNOSIS — M949 Disorder of cartilage, unspecified: Secondary | ICD-10-CM

## 2014-01-08 DIAGNOSIS — F1011 Alcohol abuse, in remission: Secondary | ICD-10-CM | POA: Diagnosis present

## 2014-01-08 DIAGNOSIS — D75839 Thrombocytosis, unspecified: Secondary | ICD-10-CM | POA: Diagnosis present

## 2014-01-08 DIAGNOSIS — R0609 Other forms of dyspnea: Secondary | ICD-10-CM | POA: Diagnosis not present

## 2014-01-08 DIAGNOSIS — M899 Disorder of bone, unspecified: Secondary | ICD-10-CM | POA: Diagnosis present

## 2014-01-08 DIAGNOSIS — D649 Anemia, unspecified: Secondary | ICD-10-CM | POA: Diagnosis not present

## 2014-01-08 DIAGNOSIS — D126 Benign neoplasm of colon, unspecified: Secondary | ICD-10-CM | POA: Diagnosis present

## 2014-01-08 DIAGNOSIS — E872 Acidosis, unspecified: Secondary | ICD-10-CM | POA: Diagnosis present

## 2014-01-08 DIAGNOSIS — Z87891 Personal history of nicotine dependence: Secondary | ICD-10-CM

## 2014-01-08 DIAGNOSIS — K5520 Angiodysplasia of colon without hemorrhage: Secondary | ICD-10-CM | POA: Diagnosis present

## 2014-01-08 DIAGNOSIS — D509 Iron deficiency anemia, unspecified: Principal | ICD-10-CM | POA: Diagnosis present

## 2014-01-08 LAB — POC OCCULT BLOOD, ED: Fecal Occult Bld: NEGATIVE

## 2014-01-08 LAB — BASIC METABOLIC PANEL
BUN: 11 mg/dL (ref 6–23)
CO2: 20 mEq/L (ref 19–32)
Calcium: 9.4 mg/dL (ref 8.4–10.5)
Chloride: 102 mEq/L (ref 96–112)
Creatinine, Ser: 0.95 mg/dL (ref 0.50–1.35)
GFR, EST NON AFRICAN AMERICAN: 89 mL/min — AB (ref >90)
GLUCOSE: 97 mg/dL (ref 70–99)
Potassium: 4.5 mEq/L (ref 3.7–5.3)
SODIUM: 138 meq/L (ref 137–147)

## 2014-01-08 LAB — MAGNESIUM: MAGNESIUM: 2 mg/dL (ref 1.5–2.5)

## 2014-01-08 LAB — CBC
HEMATOCRIT: 11.6 % — AB (ref 39.0–52.0)
Hemoglobin: 3.3 g/dL — CL (ref 13.0–17.0)
MCH: 16.2 pg — ABNORMAL LOW (ref 26.0–34.0)
MCHC: 28.4 g/dL — AB (ref 30.0–36.0)
MCV: 56.9 fL — ABNORMAL LOW (ref 78.0–100.0)
Platelets: 537 10*3/uL — ABNORMAL HIGH (ref 150–400)
RBC: 2.04 MIL/uL — ABNORMAL LOW (ref 4.22–5.81)
RDW: 25.3 % — AB (ref 11.5–15.5)
WBC: 6.1 10*3/uL (ref 4.0–10.5)

## 2014-01-08 LAB — HEPATIC FUNCTION PANEL
ALT: 9 U/L (ref 0–53)
AST: 18 U/L (ref 0–37)
Albumin: 3.3 g/dL — ABNORMAL LOW (ref 3.5–5.2)
Alkaline Phosphatase: 38 U/L — ABNORMAL LOW (ref 39–117)
Bilirubin, Direct: <0.2 mg/dL (ref 0.0–0.3)
TOTAL PROTEIN: 7.7 g/dL (ref 6.0–8.3)
Total Bilirubin: 0.6 mg/dL (ref 0.3–1.2)

## 2014-01-08 LAB — IRON AND TIBC
Iron: <10 ug/dL — ABNORMAL LOW (ref 42–135)
UIBC: >575 ug/dL — ABNORMAL HIGH (ref 125–400)

## 2014-01-08 LAB — PRO B NATRIURETIC PEPTIDE: PRO B NATRI PEPTIDE: 419.2 pg/mL — AB (ref 0–125)

## 2014-01-08 LAB — ABO/RH: ABO/RH(D): A POS

## 2014-01-08 LAB — VITAMIN B12: Vitamin B-12: 573 pg/mL (ref 211–911)

## 2014-01-08 LAB — I-STAT TROPONIN, ED: TROPONIN I, POC: 0.01 ng/mL (ref 0.00–0.08)

## 2014-01-08 LAB — PREPARE RBC (CROSSMATCH)

## 2014-01-08 LAB — RETICULOCYTES
RBC.: 2.11 MIL/uL — AB (ref 4.22–5.81)
RETIC COUNT ABSOLUTE: 50.6 10*3/uL (ref 19.0–186.0)
Retic Ct Pct: 2.4 % (ref 0.4–3.1)

## 2014-01-08 LAB — ETHANOL: Alcohol, Ethyl (B): <11 mg/dL (ref 0–11)

## 2014-01-08 LAB — PHOSPHORUS: Phosphorus: 3.9 mg/dL (ref 2.3–4.6)

## 2014-01-08 LAB — FERRITIN: Ferritin: 3 ng/mL — ABNORMAL LOW (ref 22–322)

## 2014-01-08 LAB — FOLATE: Folate: 11.3 ng/mL

## 2014-01-08 MED ORDER — SODIUM CHLORIDE 0.9 % IJ SOLN
3.0000 mL | Freq: Two times a day (BID) | INTRAMUSCULAR | Status: DC
  Administered 2014-01-08 – 2014-01-11 (×4): 3 mL via INTRAVENOUS

## 2014-01-08 MED ORDER — FOLIC ACID 1 MG PO TABS
1.0000 mg | ORAL_TABLET | Freq: Every day | ORAL | Status: DC
  Administered 2014-01-08 – 2014-01-11 (×4): 1 mg via ORAL
  Filled 2014-01-08 (×5): qty 1

## 2014-01-08 MED ORDER — ALBUTEROL SULFATE (2.5 MG/3ML) 0.083% IN NEBU
5.0000 mg | INHALATION_SOLUTION | Freq: Once | RESPIRATORY_TRACT | Status: AC
  Administered 2014-01-08: 5 mg via RESPIRATORY_TRACT
  Filled 2014-01-08: qty 6

## 2014-01-08 MED ORDER — LORAZEPAM 1 MG PO TABS: 1.0000 mg | ORAL_TABLET | Freq: Four times a day (QID) | ORAL | Status: DC | PRN

## 2014-01-08 MED ORDER — ENOXAPARIN SODIUM 40 MG/0.4ML ~~LOC~~ SOLN
40.0000 mg | SUBCUTANEOUS | Status: DC
  Administered 2014-01-08: 40 mg via SUBCUTANEOUS
  Filled 2014-01-08 (×2): qty 0.4

## 2014-01-08 MED ORDER — LORAZEPAM 2 MG/ML IJ SOLN: 1.0000 mg | Freq: Four times a day (QID) | INTRAMUSCULAR | Status: DC | PRN

## 2014-01-08 MED ORDER — THIAMINE HCL 100 MG/ML IJ SOLN
100.0000 mg | Freq: Every day | INTRAMUSCULAR | Status: DC
  Filled 2014-01-08: qty 1
  Filled 2014-01-08: qty 2
  Filled 2014-01-08 (×2): qty 1

## 2014-01-08 MED ORDER — VITAMIN B-1 100 MG PO TABS
100.0000 mg | ORAL_TABLET | Freq: Every day | ORAL | Status: DC
  Administered 2014-01-08 – 2014-01-11 (×4): 100 mg via ORAL
  Filled 2014-01-08 (×4): qty 1

## 2014-01-08 MED ORDER — ADULT MULTIVITAMIN W/MINERALS CH
1.0000 | ORAL_TABLET | Freq: Every day | ORAL | Status: DC
  Administered 2014-01-08 – 2014-01-11 (×4): 1 via ORAL
  Filled 2014-01-08 (×4): qty 1

## 2014-01-08 NOTE — Progress Notes (Signed)
Called ED for report. Room ready for admit.  

## 2014-01-08 NOTE — ED Provider Notes (Signed)
CSN: [id number]     Arrival date & time 01/08/14  1253 History   First MD Initiated Contact with Patient 01/08/14 1330     Chief Complaint  Patient presents with  . Shortness of Breath   (Consider location/radiation/quality/duration/timing/severity/associated sxs/prior Treatment) HPI Comments: 63 year old male presents to the urgent care with concerns of shortness of breath, DOE, fatigue and occasional chest discomfort associated with minimal exertion. These exertional symptoms started approximately 2 weeks ago and are gradually getting worse. He denies chest pain but does endorse chest discomfort that is relieved with rest. He is unable to describe the type of chest discomfort almost always is associated with dyspnea. The symptoms are better with rest and initiated with exertion. He is not taking any medications and he has no PCP.  Hx of Frost bite and smoker for approx 20 yrs...stopping about 5 y ago, but no known HTN, cardiopulmonary dz, organ dz.    Past Medical History  Diagnosis Date  . History of frostbite    History reviewed. No pertinent past surgical history. History reviewed. No pertinent family history. History  Substance Use Topics  . Smoking status: Former Smoker    Types: Cigarettes    Quit date: 10/05/2007  . Smokeless tobacco: Not on file  . Alcohol Use: No    Review of Systems  Constitutional: Positive for activity change and fatigue. Negative for fever.  HENT: Negative.   Respiratory: Positive for shortness of breath. Negative for cough, chest tightness and wheezing.   Cardiovascular: Negative for chest pain, palpitations and leg swelling.  Gastrointestinal: Negative.   Genitourinary: Negative.   Musculoskeletal: Negative.   Skin: Negative for pallor and rash.  Neurological: Negative.   Psychiatric/Behavioral: Negative.     Allergies  Review of patient's allergies indicates no known allergies.  Home Medications   Current Outpatient Rx  Name  Route  Sig   Dispense  Refill  . gabapentin (NEURONTIN) 300 MG capsule   Oral   Take 1 capsule (300 mg total) by mouth 3 (three) times daily.   90 capsule   5    BP 131/72  Pulse 102  Temp(Src) 98.5 F (36.9 C) (Oral)  Resp 20  SpO2 100% Physical Exam  Nursing note and vitals reviewed. Constitutional: He is oriented to person, place, and time. He appears well-developed and well-nourished. No distress.  Eyes: Conjunctivae and EOM are normal.  Neck: Normal range of motion. Neck supple.  Cardiovascular: Normal rate, regular rhythm and intact distal pulses.  Exam reveals gallop.   Murmur heard. 3/6 SEM with S4 gallop.  Pulmonary/Chest: No respiratory distress. He has no wheezes. He has no rales.  At rest tachypnea  Abdominal: Soft. There is no tenderness.  Neurological: He is alert and oriented to person, place, and time.  Skin: Skin is warm and dry.  Psychiatric: He has a normal mood and affect.    ED Course  Procedures (including critical care time) Labs Review Labs Reviewed - No data to display Imaging Review Dg Chest 2 View  01/08/2014   CLINICAL DATA:  Increasing shortness of breath. Worse with exertion.  EXAM: CHEST  2 VIEW  COMPARISON:  None.  FINDINGS: COPD with mild hyperinflation. No active infiltrates or failure. Normal cardiomediastinal silhouette. No osseous findings.  IMPRESSION: COPD, no active disease.   Electronically Signed   By: Rolla Flatten M.D.   On: 01/08/2014 14:06   EKG: NSR, No ectopy. High amplitude R waves in lateral leads. No S-T T changes suggestive of  ischemia.  MDM   1. DOE (dyspnea on exertion)   2. Discomfort in chest   3. COPD (chronic obstructive pulmonary disease)     Transfer to [facility] ED for eval and management of progressive DOE and chest discomfort for past 2 weeks.  Stable now, asymptomatic at rest. EKG without current evidence of infarct or ischemia. Pt st feels well now. Dyspnea may be an anginal equivalent, also consider previously undiagnosed  COPD Has no follow up.     Janne Napoleon, NP 01/08/14 1432

## 2014-01-08 NOTE — ED Provider Notes (Signed)
Medical screening examination/treatment/procedure(s) were performed by resident physician or non-physician practitioner and as supervising physician I was immediately available for consultation/collaboration.   Pauline Good MD.   Billy Fischer, MD 01/08/14 [phone]

## 2014-01-08 NOTE — ED Notes (Signed)
Pt sent here from [facility] for further evaluation of COPD. Pt sts SOB x 2 weeks. No hx of COPD. sts SOB worth with exertion.

## 2014-01-08 NOTE — ED Notes (Signed)
Internal MD at bedside.

## 2014-01-08 NOTE — H&P (Signed)
Date: 01/08/2014               Patient Name:  William Jones MRN: [id number]  DOB: [birth year] Age / Sex: 63 y.o., male   PCP: Becky Sax, MD         Medical Service: Internal Medicine Teaching Service         Attending Physician: Dr. Bartholomew Crews, MD    First Contact: Dr. Ronnald Ramp Pager: [phone]  Second Contact: Dr. Eula Fried Pager: [phone]       After Hours (After 5p/  First Contact Pager: [phone]  weekends / holidays): Second Contact Pager: [phone]   Chief Complaint: SOB  History of Present Illness: 89 y o male with no significant past medical history, except for frostbite with subsequent leg pains in 2009. Presented to [facility] long ED with complaints of shortness of breath, and was transferred here for evaluation. Shortness of breath started 2 weeks, has been gradually increasing in severity, such that now he can't take a walk from his  Bathroom to his bed without getting severely short of breath. Decided to come in today because he said he couldn't do  Anything with getting SOB. Uses one pillow to sleep and has for several years, though feels very uncomfortable when he lies flat with chest tightness, and feels much better when he sits up. Patient says he has gained 10-11 pounds of weight, over the past 5 months, and bilateral leg swelling about a month ago but that resolved. Associated cough , No chest pain, no calf pain or redness.  Patient endoses craving ice and corn starch over the past 5-6 years, has never had a colonoscopy, denies dark stools or blood in stools- has a bowel movement 6-7 times, normal formed stool, no history abdominal pain or of vomiting blood, occasional use of Goody powder- last in [place] he had a cold, denies use of NSAIDS, only medication he takes is gabapentin which he hasn't taken in a month- as he thought this was the cause of his shortness of breath, no family hx of cancer or sickle cell disease or other blood dyscrasias, no prior blood  transfusions,  no blood in urine or dark urine, no urinary symptoms, no bone pains, no night sweats, no exposure to chemicals or asbestos- worked in [employer], until he retired in 1990. Pt eats 3 meals a day, says he eats vegetables, meat and fish- essentially a balanced. Pt has smoked cigarettes since he was a teenager, used to smoke about 6 cigarettes a day, stopped smoking in 2009-about 6 years ago. Patient takes a pint of alcohol whiskey brandy every Day, has been drinking alcohol since he was a teenager, feels he should cut down on his drinking and would like to cut down on his drinking, but denies withdrawal symptoms when he hasn't taken drink, and doesn't take alcohol as an eye opener in the morning, his significant other feels he should cut down his drinking- but he doesn't get angry when she tells him. Denies current drug use, but records show this was in problem in the past. As per chart review- 01/08/2010- CT abd imaging results- revealed that pt had An abnormal rectal exam with hardened prostate, an elevated PSA of 32.9, No obvious prostatic abnormality by CT, neoplasm was not excluded- given suboptimal evaluation and urology consultation was recommended.     No prescriptions prior to admission    Meds: Current Facility-Administered Medications  Medication Dose Route Frequency Provider Last Rate Last Dose  .  folic acid (FOLVITE) tablet 1 mg  1 mg Oral Daily Jessee Avers, MD      . LORazepam (ATIVAN) tablet 1 mg  1 mg Oral Q6H PRN Jessee Avers, MD       Or  . LORazepam (ATIVAN) injection 1 mg  1 mg Intravenous Q6H PRN Jessee Avers, MD      . multivitamin with minerals tablet 1 tablet  1 tablet Oral Daily Jessee Avers, MD      . thiamine (VITAMIN B-1) tablet 100 mg  100 mg Oral Daily Jessee Avers, MD       Or  . thiamine (B-1) injection 100 mg  100 mg Intravenous Daily Jessee Avers, MD        Allergies: Allergies as of 01/08/2014  . (No Known Allergies)   Past  Medical History  Diagnosis Date  . History of frostbite    History reviewed. No pertinent past surgical history. Family History  Problem Relation Age of Onset  . Stroke Maternal Uncle   . Kidney failure Brother 68    has been on HD since age 55    History   Social History  . Marital Status: Widowed    Spouse Name: N/A    Number of Children: N/A  . Years of Education: N/A   Occupational History  . retired    Social History Main Topics  . Smoking status: Former Smoker    Types: Cigarettes    Quit date: 10/05/2007  . Smokeless tobacco: Not on file  . Alcohol Use: No  . Drug Use: No  . Sexual Activity: Not Currently   Other Topics Concern  . Not on file   Social History Narrative   On disability - frostbite. Used to work for [employer]. Retired in 1990. Has 2 kids. 6th grade.     Review of Systems: As per HPI.  Physical Exam: Blood pressure 137/79, pulse 107, temperature 98.5 F (36.9 C), temperature source Oral, resp. rate 16, height 5\' 10"  (1.778 m), weight 158 lb 6.4 oz (71.85 kg), SpO2 100.00%. GENERAL- alert, co-operative, appears as stated age, not in any distress. HEENT- Atraumatic, normocephalic, PERRL, EOMI, oral mucosa appears moist, no cervical LN enlargement, thyroid does not appear enlarged. CARDIAC- tachycardic , no murmurs, rubs or gallops. RESP- Moving equal volumes of air, few bilat basilar crackles no wheezes. ABDOMEN- Soft, nontender, no palpable masses or organomegaly- Liver not palpably enlarged, bowel sounds present, Rectal exam- Normal skin, no masses or skin tags, no external hemorrhoids, normal anal sphincter tone, rectal vault empty, prostate could not be appreciated, gloved finger not stained with stool. BACK- Normal curvature of the spine, No tenderness along the vertebrae, no CVA tenderness. NEURO- No obvious Cr N deficit, strenght 5/5 in upper and lower extremities. EXTREMITIES- pulse 2+, symmetric, no pedal edema. SKIN- Warm, dry, No  rash or lesion. PSYCH- Normal mood and affect, appropriate thought content and speech.  Lab results: Basic Metabolic Panel:  Recent Labs  01/08/14 1736  NA 138  K 4.5  CL 102  CO2 20  GLUCOSE 97  BUN 11  CREATININE 0.95  CALCIUM 9.4  Anion gap- 16.  CBC:  Recent Labs  01/08/14 1539  WBC 6.1  HGB 3.3*  HCT 11.6*  MCV 56.9*  PLT 537*   Cardiac Enzymes: No results found for this basename: CKTOTAL, CKMB, CKMBINDEX, TROPONINI,  in the last 72 hours BNP:  Recent Labs  01/08/14 1559  PROBNP 419.2*   Anemia Panel:  Recent Labs  01/08/14 1702  RETICCTPCT 2.4   Coagulation: No results found for this basename: LABPROT, INR,  in the last 72 hours Urine Drug Screen: Drugs of Abuse  No results found for this basename: labopia,  cocainscrnur,  labbenz,  amphetmu,  thcu,  labbarb    Alcohol Level: No results found for this basename: ETH,  in the last 72 hours Urinalysis: No results found for this basename: COLORURINE, APPERANCEUR, LABSPEC, PHURINE, GLUCOSEU, HGBUR, BILIRUBINUR, KETONESUR, PROTEINUR, UROBILINOGEN, NITRITE, LEUKOCYTESUR,  in the last 72 hours  Imaging results:  Dg Chest 2 View  01/08/2014   CLINICAL DATA:  Increasing shortness of breath. Worse with exertion.  EXAM: CHEST  2 VIEW  COMPARISON:  None.  FINDINGS: COPD with mild hyperinflation. No active infiltrates or failure. Normal cardiomediastinal silhouette. No osseous findings.  IMPRESSION: COPD, no active disease.   Electronically Signed   By: Rolla Flatten M.D.   On: 01/08/2014 14:06    Other results: EKG: Rate- 99bpm, with LVH.   Assessment & Plan by Problem:  Symptomatic Anemia- Likely aetiology of pts SOB, with CBC revealing a Hgb of 3.3, Causing a high output cardiac failure- With elevated ProBNP- 419.2. Iron deficiency anemia is the most likely cause of his anemia- Pt has Pica- endorses craving ice(pagophagia) and corn starch, CBC reveals -MCV- 56.9, MCH- 16.2, MCHC- 28.4, RDW- 25.3, with  thrombocytosis, with abnormal bone marrow response- The retic count should be increased, in the light of anemia, but is normal, all consistent with iron deficiency anemia. Bone marrow depression unlikely considering Retic- normal- 2.4 %, Plt increased- 537. Cause of pts iron deficiency is unknown at this time, as he endorsed eating what appears to be a normal diet, GI loss is a very possible etiology, as pt has never had a colonoscopy, but pt denied Melena and blood in stools and FOBT X1 was negative. GI Malignancy possible but pt appears to be in good state of health and endorsed weight gain. Sideroblastic anemia- considered in this pt hypochromia and microcytosis, but now unlikely in the light of his ferritin levels.  Also consider a pure red cell aplasia, or BM suppression but retic count is normal and other cell count indices are not indicative. Urinalysis results- 2009- positive for Hgb, but with 7-10 RBCs,  most likely due to myoglobin as pt CK was markedly elevated at that time, but consider malignancy involving the urinary tract- Prostate- as Pt has hx of Hardened prostate on exam,  and elevated PSA, also consider Renal cell ca (significant smoking hx) . Pt was ordered in the ED and  transfused with 2 units of blood. - Admit to Tele - Post transfusion CBC, goal to Keep hemoglobin > 7. - Ferritin- reduced at 3, Serum iron reduced at  < 10 and UIBC (Unsaturated iron Binding capacity) increased at>575. - UA - Consider colonoscopy and endoscopy as an inpatient, to rule out Gi blood loss in this 7 y o male, who has never had colonoscopy or medical follow up.   - If workup fails to reveal any etiology, consider possibly repeating PSA, Uss of the prostate and kidneys to rule out malig involving the kidneys- Renal cell ca, and Prostate ca. - HIV - Commence Iron therapy- parenteral considering severity, 1g daily iv X3 days, and commence oral FE 325 TID- Recs by pharm.  ? Hepatitis- Evidenced by elevated  LFTs in the past. Most likely due to excessive alcohol intake, possibly hepatic steatosis or Hepatitis. CMP in the past has revealed an  elevated liver enzymes- AST > ALT, not in keeping with alcoholic liver disease.  - CMP - Hepatitis panel. - PT-INR - APTT  Excessive alcohol Intake- Pt agrees he has a drinking problem. But denies having any withdraweal symptoms when he goes without drinking.  CAGE- 1/4- feels he needs to cut down on his drinking. Pt also says he is ready and willing to cut down on his drinking. - CMP to access liver function. - CIWA protocol. - Blood alcohol level - Folate and B12 level. - Magnesium level - Phosphorus level - Social work consult. - Folic acid 1mg  daily - Thiamine- 100mg  daily. - UDS  Increased Anion gap- Of 16, but bicarb of 20, most likely due to Alcoholic ketoacidosis.  Dispo: Disposition is deferred at this time, awaiting improvement of current medical problems.   The patient does not have a current PCP (Becky Sax, MD) and does not need an [facility] hospital follow-up appointment after discharge.  The patient does not know have transportation limitations that hinder transportation to clinic appointments.  Signed: Jenetta Downer, MD 01/08/2014, 9:23 PM

## 2014-01-08 NOTE — ED Provider Notes (Signed)
CSN: [id number]     Arrival date & time 01/08/14  1444 History   First MD Initiated Contact with Patient 01/08/14 1628     Chief Complaint  Patient presents with  . Shortness of Breath     (Consider location/radiation/quality/duration/timing/severity/associated sxs/prior Treatment) HPI Comments: Patient presents emergency department with chief complaint of shortness of breath and fatigue.  She states that he first noticed the symptoms approximately 2 weeks ago. He states that his shortness of breath is worsened with exertion. He denies chest pain. States that he has not had symptoms like this before. He denies fevers, chills, nausea, or vomiting. He is seen urgently at urgent care today, was transferred to the ED.  The history is provided by the patient. No language interpreter was used.    Past Medical History  Diagnosis Date  . History of frostbite    History reviewed. No pertinent past surgical history. History reviewed. No pertinent family history. History  Substance Use Topics  . Smoking status: Former Smoker    Types: Cigarettes    Quit date: 10/05/2007  . Smokeless tobacco: Not on file  . Alcohol Use: No    Review of Systems  Constitutional: Positive for fatigue. Negative for fever and chills.  Respiratory: Positive for shortness of breath.   Cardiovascular: Negative for chest pain.  Gastrointestinal: Negative for nausea, vomiting, diarrhea and constipation.  Genitourinary: Negative for dysuria.      Allergies  Review of patient's allergies indicates no known allergies.  Home Medications  No current outpatient prescriptions on file. BP 122/65  Pulse 105  Temp(Src) 97.8 F (36.6 C) (Oral)  Resp 20  Ht 5\' 10"  (1.778 m)  Wt 166 lb (75.297 kg)  BMI 23.82 kg/m2 Physical Exam  Nursing note and vitals reviewed. Constitutional: He is oriented to person, place, and time. He appears well-developed and well-nourished.  HENT:  Head: Normocephalic and atraumatic.   Diffuse pallor throughout mucous membranes  Eyes: Conjunctivae and EOM are normal. Pupils are equal, round, and reactive to light. Right eye exhibits no discharge. Left eye exhibits no discharge. No scleral icterus.  Neck: Normal range of motion. Neck supple. No JVD present.  Cardiovascular: Regular rhythm.  Exam reveals no gallop and no friction rub.   Murmur heard. Tachycardic  Pulmonary/Chest: Effort normal and breath sounds normal. No respiratory distress. He has no wheezes. He has no rales. He exhibits no tenderness.  Abdominal: Soft. He exhibits no distension and no mass. There is no tenderness. There is no rebound and no guarding.  Genitourinary:  Normal rectal exam, no masses, hemorrhoids, or abscess, no gross blood on finger, a chaperone present  Musculoskeletal: Normal range of motion. He exhibits no edema and no tenderness.  Neurological: He is alert and oriented to person, place, and time.  Skin: Skin is warm and dry. There is pallor.  Psychiatric: He has a normal mood and affect. His behavior is normal. Judgment and thought content normal.    ED Course  Procedures (including critical care time) Results for orders placed during the hospital encounter of 01/08/14  CBC      Result Value Ref Range   WBC 6.1  4.0 - 10.5 K/uL   RBC 2.04 (*) 4.22 - 5.81 MIL/uL   Hemoglobin 3.3 (*) 13.0 - 17.0 g/dL   HCT 11.6 (*) 39.0 - 52.0 %   MCV 56.9 (*) 78.0 - 100.0 fL   MCH 16.2 (*) 26.0 - 34.0 pg   MCHC 28.4 (*) 30.0 - 36.0  g/dL   RDW 25.3 (*) 11.5 - 15.5 %   Platelets 537 (*) 150 - 400 K/uL  PRO B NATRIURETIC PEPTIDE      Result Value Ref Range   Pro B Natriuretic peptide (BNP) 419.2 (*) 0 - 125 pg/mL  RETICULOCYTES      Result Value Ref Range   Retic Ct Pct 2.4  0.4 - 3.1 %   RBC. 2.11 (*) 4.22 - 5.81 MIL/uL   Retic Count, Manual 50.6  19.0 - 186.0 K/uL  BASIC METABOLIC PANEL      Result Value Ref Range   Sodium 138  137 - 147 mEq/L   Potassium 4.5  3.7 - 5.3 mEq/L   Chloride  102  96 - 112 mEq/L   CO2 20  19 - 32 mEq/L   Glucose, Bld 97  70 - 99 mg/dL   BUN 11  6 - 23 mg/dL   Creatinine, Ser 0.95  0.50 - 1.35 mg/dL   Calcium 9.4  8.4 - 10.5 mg/dL   GFR calc non Af Amer 89 (*) >90 mL/min   GFR calc Af Amer >90  >90 mL/min  I-STAT TROPOININ, ED      Result Value Ref Range   Troponin i, poc 0.01  0.00 - 0.08 ng/mL   Comment 3           POC OCCULT BLOOD, ED      Result Value Ref Range   Fecal Occult Bld NEGATIVE  NEGATIVE  TYPE AND SCREEN      Result Value Ref Range   ABO/RH(D) A POS     Antibody Screen NEG     Sample Expiration 01/11/2014     Unit Number [id number]     Blood Component Type RED CELLS,LR     Unit division 00     Status of Unit ISSUED     Transfusion Status OK TO TRANSFUSE     Crossmatch Result Compatible     Unit Number [id number]     Blood Component Type RED CELLS,LR     Unit division 00     Status of Unit ALLOCATED     Transfusion Status OK TO TRANSFUSE     Crossmatch Result Compatible    PREPARE RBC (CROSSMATCH)      Result Value Ref Range   Order Confirmation ORDER PROCESSED BY BLOOD BANK    ABO/RH      Result Value Ref Range   ABO/RH(D) A POS     Dg Chest 2 View  01/08/2014   CLINICAL DATA:  Increasing shortness of breath. Worse with exertion.  EXAM: CHEST  2 VIEW  COMPARISON:  None.  FINDINGS: COPD with mild hyperinflation. No active infiltrates or failure. Normal cardiomediastinal silhouette. No osseous findings.  IMPRESSION: COPD, no active disease.   Electronically Signed   By: Rolla Flatten M.D.   On: 01/08/2014 14:06    Imaging Review Dg Chest 2 View  01/08/2014   CLINICAL DATA:  Increasing shortness of breath. Worse with exertion.  EXAM: CHEST  2 VIEW  COMPARISON:  None.  FINDINGS: COPD with mild hyperinflation. No active infiltrates or failure. Normal cardiomediastinal silhouette. No osseous findings.  IMPRESSION: COPD, no active disease.   Electronically Signed   By: Rolla Flatten M.D.   On: 01/08/2014 14:06      EKG Interpretation None      MDM   Final diagnoses:  Anemia  SOB (shortness of breath)    Patient with shortness of breath,  and fatigue. Hemoglobin of 3.3. No known source of bleeding. Will order anemia panel, and transfuse 2 units. Patient seen by and discussed with Dr. Alvino Chapel.   Medications  albuterol (PROVENTIL) (2.5 MG/3ML) 0.083% nebulizer solution 5 mg (5 mg Nebulization Given 01/08/14 1659)    CRITICAL CARE Performed by: Montine Circle   Total critical care time: 35  Critical care time was exclusive of separately billable procedures and treating other patients.  Critical care was necessary to treat or prevent imminent or life-threatening deterioration.  Critical care was time spent personally by me on the following activities: development of treatment plan with patient and/or surrogate as well as nursing, discussions with consultants, evaluation of patient's response to treatment, examination of patient, obtaining history from patient or surrogate, ordering and performing treatments and interventions, ordering and review of laboratory studies, ordering and review of radiographic studies, pulse oximetry and re-evaluation of patient's condition.     Montine Circle, PA-C 01/08/14 [place], PA-C 01/08/14 [phone]

## 2014-01-08 NOTE — ED Notes (Signed)
Pt c/o generlized weakness and has been putting it off.

## 2014-01-08 NOTE — ED Notes (Signed)
Reports sob and fatigue x 2 wks.   Denies  Any cardiac hx.  Or hx of asthma.   No chest pain

## 2014-01-09 ENCOUNTER — Encounter (HOSPITAL_COMMUNITY): Payer: Self-pay | Admitting: *Deleted

## 2014-01-09 DIAGNOSIS — Z1211 Encounter for screening for malignant neoplasm of colon: Secondary | ICD-10-CM | POA: Diagnosis not present

## 2014-01-09 DIAGNOSIS — F1010 Alcohol abuse, uncomplicated: Secondary | ICD-10-CM

## 2014-01-09 DIAGNOSIS — D509 Iron deficiency anemia, unspecified: Secondary | ICD-10-CM | POA: Diagnosis not present

## 2014-01-09 LAB — URINALYSIS, ROUTINE W REFLEX MICROSCOPIC
Bilirubin Urine: NEGATIVE
Glucose, UA: NEGATIVE mg/dL
Hgb urine dipstick: NEGATIVE
Ketones, ur: NEGATIVE mg/dL
Leukocytes, UA: NEGATIVE
NITRITE: NEGATIVE
PH: 6 (ref 5.0–8.0)
Protein, ur: NEGATIVE mg/dL
Specific Gravity, Urine: 1.018 (ref 1.005–1.030)
UROBILINOGEN UA: 1 mg/dL (ref 0.0–1.0)

## 2014-01-09 LAB — CBC
HEMATOCRIT: 19.1 % — AB (ref 39.0–52.0)
HEMATOCRIT: 23.7 % — AB (ref 39.0–52.0)
HEMOGLOBIN: 7.6 g/dL — AB (ref 13.0–17.0)
Hemoglobin: 5.9 g/dL — CL (ref 13.0–17.0)
MCH: 20.7 pg — ABNORMAL LOW (ref 26.0–34.0)
MCH: 22.2 pg — ABNORMAL LOW (ref 26.0–34.0)
MCHC: 30.9 g/dL (ref 30.0–36.0)
MCHC: 32.1 g/dL (ref 30.0–36.0)
MCV: 67 fL — ABNORMAL LOW (ref 78.0–100.0)
MCV: 69.3 fL — AB (ref 78.0–100.0)
Platelets: 561 10*3/uL — ABNORMAL HIGH (ref 150–400)
Platelets: 628 10*3/uL — ABNORMAL HIGH (ref 150–400)
RBC: 2.85 MIL/uL — AB (ref 4.22–5.81)
RBC: 3.42 MIL/uL — ABNORMAL LOW (ref 4.22–5.81)
WBC: 5.5 10*3/uL (ref 4.0–10.5)
WBC: 6.6 10*3/uL (ref 4.0–10.5)

## 2014-01-09 LAB — HEPATITIS PANEL, ACUTE
HCV Ab: NEGATIVE
HEP B C IGM: NONREACTIVE
Hep A IgM: NONREACTIVE
Hepatitis B Surface Ag: NEGATIVE

## 2014-01-09 LAB — RAPID URINE DRUG SCREEN, HOSP PERFORMED
Amphetamines: NOT DETECTED
BENZODIAZEPINES: NOT DETECTED
Barbiturates: NOT DETECTED
COCAINE: NOT DETECTED
Opiates: NOT DETECTED
TETRAHYDROCANNABINOL: POSITIVE — AB

## 2014-01-09 LAB — COMPREHENSIVE METABOLIC PANEL
ALBUMIN: 3.1 g/dL — AB (ref 3.5–5.2)
ALT: 10 U/L (ref 0–53)
AST: 19 U/L (ref 0–37)
Alkaline Phosphatase: 41 U/L (ref 39–117)
BUN: 10 mg/dL (ref 6–23)
CALCIUM: 9 mg/dL (ref 8.4–10.5)
CO2: 22 meq/L (ref 19–32)
CREATININE: 0.98 mg/dL (ref 0.50–1.35)
Chloride: 105 mEq/L (ref 96–112)
GFR calc Af Amer: >90 mL/min (ref >90)
GFR, EST NON AFRICAN AMERICAN: 88 mL/min — AB (ref >90)
Glucose, Bld: 90 mg/dL (ref 70–99)
Potassium: 4.1 mEq/L (ref 3.7–5.3)
SODIUM: 141 meq/L (ref 137–147)
Total Bilirubin: 1 mg/dL (ref 0.3–1.2)
Total Protein: 7.6 g/dL (ref 6.0–8.3)

## 2014-01-09 LAB — APTT: APTT: 29 s (ref 24–37)

## 2014-01-09 LAB — PROTIME-INR
INR: 1.14 (ref 0.00–1.49)
PROTHROMBIN TIME: 14.4 s (ref 11.6–15.2)

## 2014-01-09 LAB — TROPONIN I
Troponin I: <0.3 ng/mL (ref <0.30)
Troponin I: <0.3 ng/mL (ref <0.30)

## 2014-01-09 LAB — PREPARE RBC (CROSSMATCH)

## 2014-01-09 LAB — HIV ANTIBODY (ROUTINE TESTING W REFLEX): HIV: NONREACTIVE

## 2014-01-09 MED ORDER — SODIUM CHLORIDE 0.9 % IV SOLN
INTRAVENOUS | Status: DC
  Administered 2014-01-09: 11:00:00 via INTRAVENOUS

## 2014-01-09 MED ORDER — IRON DEXTRAN 50 MG/ML IJ SOLN
25.0000 mg | Freq: Once | INTRAMUSCULAR | Status: AC
  Administered 2014-01-09: 25 mg via INTRAVENOUS
  Filled 2014-01-09: qty 0.5

## 2014-01-09 MED ORDER — PANTOPRAZOLE SODIUM 40 MG PO TBEC
40.0000 mg | DELAYED_RELEASE_TABLET | Freq: Every day | ORAL | Status: DC
  Administered 2014-01-09 – 2014-01-10 (×2): 40 mg via ORAL
  Filled 2014-01-09 (×2): qty 1

## 2014-01-09 MED ORDER — PEG 3350-KCL-NA BICARB-NACL 420 G PO SOLR
4000.0000 mL | Freq: Once | ORAL | Status: AC
  Administered 2014-01-09: 4000 mL via ORAL
  Filled 2014-01-09: qty 4000

## 2014-01-09 MED ORDER — IRON DEXTRAN 50 MG/ML IJ SOLN
1000.0000 mg | Freq: Every day | INTRAMUSCULAR | Status: DC
  Administered 2014-01-10 – 2014-01-11 (×2): 1000 mg via INTRAVENOUS
  Filled 2014-01-09 (×5): qty 20

## 2014-01-09 MED ORDER — FERROUS SULFATE 325 (65 FE) MG PO TABS: 325.0000 mg | ORAL_TABLET | Freq: Three times a day (TID) | ORAL | Status: DC

## 2014-01-09 MED ORDER — FERROUS SULFATE 325 (65 FE) MG PO TABS
325.0000 mg | ORAL_TABLET | Freq: Three times a day (TID) | ORAL | Status: DC
  Administered 2014-01-09 – 2014-01-11 (×6): 325 mg via ORAL
  Filled 2014-01-09 (×10): qty 1

## 2014-01-09 NOTE — Progress Notes (Signed)
CSW attempted to see Pt. Med Techs were assisting Pt.  CSW will assess Pt in the a.m.    CSW will continue to follow Pt.  [facility]  4N 1-16;  [id number] Phone: [phone]

## 2014-01-09 NOTE — ED Provider Notes (Signed)
Medical screening examination/treatment/procedure(s) were performed by non-physician practitioner and as supervising physician I was immediately available for consultation/collaboration.   EKG Interpretation None       Jasper Riling. Alvino Chapel, MD 01/09/14 0263

## 2014-01-09 NOTE — Progress Notes (Signed)
Subjective: Patient seen at bedside this AM. Says he is feeling much better after receiving 2 units of PRBC's/ Says he is less fatigued and weak than he claimed he was prior to admission. He denies any chest pain, SOB at rest (does admit to mild DOE when ambulating to the bathroom), dizziness, and lightheadedness. No abdominal pain on exam.   Objective: Vital signs in last 24 hours: Filed Vitals:   01/09/14 0326 01/09/14 0600 01/09/14 0937 01/09/14 1114  BP: 120/77 114/98 120/85 131/85  Pulse: 92 87 86 83  Temp: 98.8 F (37.1 C) 98.4 F (36.9 C) 97.3 F (36.3 C) 97.9 F (36.6 C)  TempSrc: Oral Oral Oral Oral  Resp: 20 20 18 18   Height:      Weight:      SpO2: 99% 97% 98% 100%   Weight change:   Intake/Output Summary (Last 24 hours) at 01/09/14 1146 Last data filed at 01/09/14 1139  Gross per 24 hour  Intake   1020 ml  Output    200 ml  Net    820 ml   Physical Exam: General: Alert, cooperative, NAD. HEENT: PERRL, EOMI. Pale but moist mucus membranes. Pale conjunctiva. Neck: Full range of motion without pain, supple, no lymphadenopathy or carotid bruits.  Lungs: Clear to ascultation bilaterally, normal work of respiration, no wheezes, rales, rhonchi Heart: RRR, no murmurs, gallops, or rubs Abdomen: Soft, non-tender, non-distended, BS + Extremities: No cyanosis, clubbing, or edema. Neurologic: Alert & oriented X3, cranial nerves II-XII intact, strength grossly intact, sensation intact to light touch  Lab Results: Basic Metabolic Panel:  Recent Labs Lab 01/08/14 1736 01/08/14 2223 01/09/14 0640  NA 138  --  141  K 4.5  --  4.1  CL 102  --  105  CO2 20  --  22  GLUCOSE 97  --  90  BUN 11  --  10  CREATININE 0.95  --  0.98  CALCIUM 9.4  --  9.0  MG  --  2.0  --   PHOS  --  3.9  --    Liver Function Tests:  Recent Labs Lab 01/08/14 2223 01/09/14 0640  AST 18 19  ALT 9 10  ALKPHOS 38* 41  BILITOT 0.6 1.0  PROT 7.7 7.6  ALBUMIN 3.3* 3.1*    CBC:  Recent Labs Lab 01/08/14 1539 01/09/14 0640  WBC 6.1 5.5  HGB 3.3* 5.9*  HCT 11.6* 19.1*  MCV 56.9* 67.0*  PLT 537* 561*   Cardiac Enzymes:  Recent Labs Lab 01/09/14 0848  TROPONINI <0.30   BNP:  Recent Labs Lab 01/08/14 1559  PROBNP 419.2*   Coagulation:  Recent Labs Lab 01/09/14 0640  LABPROT 14.4  INR 1.14   Anemia Panel:  Recent Labs Lab 01/08/14 1702  VITAMINB12 573  FOLATE 11.3  FERRITIN 3*  TIBC NOT CALC  IRON <10*  RETICCTPCT 2.4   Urine Drug Screen: Drugs of Abuse     Component Value Date/Time   LABOPIA NONE DETECTED 01/09/2014 0335   COCAINSCRNUR NONE DETECTED 01/09/2014 0335   LABBENZ NONE DETECTED 01/09/2014 0335   AMPHETMU NONE DETECTED 01/09/2014 0335   THCU POSITIVE* 01/09/2014 0335   LABBARB NONE DETECTED 01/09/2014 0335    Alcohol Level:  Recent Labs Lab 01/08/14 2223  ETH <11   Urinalysis:  Recent Labs Lab 01/09/14 0335  COLORURINE YELLOW  LABSPEC 1.018  PHURINE 6.0  GLUCOSEU NEGATIVE  HGBUR NEGATIVE  BILIRUBINUR NEGATIVE  KETONESUR NEGATIVE  PROTEINUR NEGATIVE  UROBILINOGEN 1.0  NITRITE NEGATIVE  LEUKOCYTESUR NEGATIVE   Studies/Results: Dg Chest 2 View  01/08/2014   CLINICAL DATA:  Increasing shortness of breath. Worse with exertion.  EXAM: CHEST  2 VIEW  COMPARISON:  None.  FINDINGS: COPD with mild hyperinflation. No active infiltrates or failure. Normal cardiomediastinal silhouette. No osseous findings.  IMPRESSION: COPD, no active disease.   Electronically Signed   By: Rolla Flatten M.D.   On: 01/08/2014 14:06   Medications: I have reviewed the patient's current medications. Scheduled Meds: . enoxaparin (LOVENOX) injection  40 mg Subcutaneous Q24H  . ferrous sulfate  325 mg Oral TID WC  . folic acid  1 mg Oral Daily  . [START ON 01/10/2014] iron dextran (INFED/DEXFERRUM) infusion  1,000 mg Intravenous Daily  . multivitamin with minerals  1 tablet Oral Daily  . pantoprazole  40 mg Oral Daily  . polyethylene  glycol-electrolytes  4,000 mL Oral Once  . sodium chloride  3 mL Intravenous Q12H  . thiamine  100 mg Oral Daily   Or  . thiamine  100 mg Intravenous Daily   Continuous Infusions: . sodium chloride 20 mL/hr at 01/09/14 1112   PRN Meds:.LORazepam, LORazepam  Assessment/Plan: [name] is a 63 y.o. male w/ no known PMHx, admitted w/ symptomatic anemia, Hb of 3.3, found to have severe Iron Deficiency Anemia.   Iron Deficiency Anemia- Patient admitted w/ symptomatic anemia w/ Hb of 3.3, MCV of 57. FOBT negative in ED. Iron profile performed, showed Iron <10, TIBC unable to be calculated, and Ferritin of 3. Folate and B12 wnl (11.3 and 573, respectively). Etiology unclear at this time, however GI malignancy in question until proven otherwise. Alcohol induced gastritis also possibility, however, seems unlikely based on severity of anemia. Patient given 2 units PRBC's w/ post-transfusion CBC of 5.9. Patient also started on Iron Dextran IV for 3 doses + Ferrous Sulfate po.  -Discussed w/ Dr. Collene Mares today, will perform colonoscopy tomorrow. Other recs pending. -Transfuse 1 more unit PRBC's or until Hb >7.0. -Continue Iron Dextran 1g IV for 3 days total + Ferrous Sulfate as above -PSA elevated in 2011; repeat PSA pending -HIV NEGATIVE  Alcohol Abuse- On admission, patient admits to excessive alcohol use. Denies withdrawal symptoms, CAGE-1, says he feels he needs to cut down. CMP shows no significant LFT abnormalities. Ethanol <11 on admission, UDS only positive for THC. -Continue CIWA for now -Social Work consulted -Continue Thiamine + Folate + MTV  DVT/PE PPx- Heparin [place]  Dispo: Disposition is deferred at this time, awaiting improvement of current medical problems.  Anticipated discharge in approximately 1-2 day(s).   The patient does have a current PCP (Becky Sax, MD) and does not need an [facility] hospital follow-up appointment after discharge.  The patient does not have  transportation limitations that hinder transportation to clinic appointments.  .Services Needed at time of discharge: Y = Yes, Blank = No PT:   OT:   RN:   Equipment:   Other:     LOS: 1 day   Corky Sox, MD 01/09/2014, 11:46 AM

## 2014-01-09 NOTE — Consult Note (Signed)
Unassigned patient Reason for Consult: Anemia. Referring Physician: KARDELL VIRGIL is an 63 y.o. male.  HPI: 63 year old black male, gives a history of worsening shortness of breath and DOE over the last 2 weeks prompting him to come to the ER. He denies having any abdominal pain, nausea, vomiting, diarrhea or constipation. He has a good appetite and his weight has been stable. He denies having any melena or hematochezia. He has had some generalized weakness and tires easily. He has some chest discomfort with exertion but this resolves when he is resting.  There is no known family history of colon cancer. He was found to have a hemoglobin of 3.3 gm/dl on admission and was transfused 2 units of PRBC's. He has a history of alcohol and tobacco abuse in the past. Reportedly guaiac negative in the ER on DRE. He claims he has never had a colonoscopy.  Past Medical History  Diagnosis Date  . History of frostbite    History reviewed. No pertinent past surgical history.  Family History  Problem Relation Age of Onset  . Stroke Maternal Uncle   . Kidney failure Brother 13    has been on HD since age 9    Social History:  reports that he quit smoking about 6 years ago. His smoked cigarettes. He smoked 0.00 packs per day. He does not have any smokeless tobacco history on file. He reports that he does not drink alcohol or use illicit drugs.  Allergies: No Known Allergies  Medications: I have reviewed the patient's current medications.  Results for orders placed during the hospital encounter of 01/08/14 (from the past 48 hour(s))  I-STAT TROPOININ, ED     Status: None   Collection Time    01/08/14  3:37 PM      Result Value Ref Range   Troponin i, poc 0.01  0.00 - 0.08 ng/mL   Comment 3            Comment: Due to the release kinetics of cTnI,     a negative result within the first hours     of the onset of symptoms does not rule out     myocardial infarction with certainty.     If  myocardial infarction is still suspected,     repeat the test at appropriate intervals.  CBC     Status: Abnormal   Collection Time    01/08/14  3:39 PM      Result Value Ref Range   WBC 6.1  4.0 - 10.5 K/uL   RBC 2.04 (*) 4.22 - 5.81 MIL/uL   Hemoglobin 3.3 (*) 13.0 - 17.0 g/dL   Comment: RESULTS VERIFIED VIA RECOLLECT     REPEATED TO VERIFY     CRITICAL RESULT CALLED TO, READ BACK BY AND VERIFIED WITH:     L.TATE,RN 1614 01/08/14 M.CAMPBELL   HCT 11.6 (*) 39.0 - 52.0 %   MCV 56.9 (*) 78.0 - 100.0 fL   MCH 16.2 (*) 26.0 - 34.0 pg   MCHC 28.4 (*) 30.0 - 36.0 g/dL   RDW 25.3 (*) 11.5 - 15.5 %   Platelets 537 (*) 150 - 400 K/uL  PRO B NATRIURETIC PEPTIDE     Status: Abnormal   Collection Time    01/08/14  3:59 PM      Result Value Ref Range   Pro B Natriuretic peptide (BNP) 419.2 (*) 0 - 125 pg/mL  TYPE AND SCREEN     Status:  None   Collection Time    01/08/14  5:00 PM      Result Value Ref Range   ABO/RH(D) A POS     Antibody Screen NEG     Sample Expiration 01/11/2014     Unit Number [id number]     Blood Component Type RED CELLS,LR     Unit division 00     Status of Unit ISSUED,FINAL     Transfusion Status OK TO TRANSFUSE     Crossmatch Result Compatible     Unit Number [id number]     Blood Component Type RED CELLS,LR     Unit division 00     Status of Unit ISSUED     Transfusion Status OK TO TRANSFUSE     Crossmatch Result Compatible     Unit Number [id number]     Blood Component Type RBC LR PHER1     Unit division 00     Status of Unit ISSUED     Transfusion Status OK TO TRANSFUSE     Crossmatch Result Compatible    PREPARE RBC (CROSSMATCH)     Status: None   Collection Time    01/08/14  5:00 PM      Result Value Ref Range   Order Confirmation ORDER PROCESSED BY BLOOD BANK    ABO/RH     Status: None   Collection Time    01/08/14  5:00 PM      Result Value Ref Range   ABO/RH(D) A POS    VITAMIN B12     Status: None   Collection Time    01/08/14   5:02 PM      Result Value Ref Range   Vitamin B-12 573  211 - 911 pg/mL   Comment: Performed at [facility]     Status: None   Collection Time    01/08/14  5:02 PM      Result Value Ref Range   Folate 11.3     Comment: (NOTE)     Reference Ranges            Deficient:       0.4 - 3.3 ng/mL            Indeterminate:   3.4 - 5.4 ng/mL            Normal:              > 5.4 ng/mL     Performed at [place] TIBC     Status: Abnormal   Collection Time    01/08/14  5:02 PM      Result Value Ref Range   Iron <10 (*) 42 - 135 ug/dL   TIBC NOT CALC  215 - 435 ug/dL   Comment: (NOTE)     Not calculated due to Iron <10.     TIBC and %SAT were not calculated due to the UIBC being >575.   Saturation Ratios NOT CALC  20 - 55 %   Comment: (NOTE)     Not calculated due to Iron <10.     TIBC and %SAT were not calculated due to the UIBC being >575.   UIBC >575 (*) 125 - 400 ug/dL   Comment: Performed at [facility]     Status: Abnormal   Collection Time    01/08/14  5:02 PM      Result Value Ref Range   Ferritin 3 (*) 22 -  322 ng/mL   Comment: Performed at [place]     Status: Abnormal   Collection Time    01/08/14  5:02 PM      Result Value Ref Range   Retic Ct Pct 2.4  0.4 - 3.1 %   RBC. 2.11 (*) 4.22 - 5.81 MIL/uL   Retic Count, Manual 50.6  19.0 - 186.0 K/uL  POC OCCULT BLOOD, ED     Status: None   Collection Time    01/08/14  5:14 PM      Result Value Ref Range   Fecal Occult Bld NEGATIVE  NEGATIVE  BASIC METABOLIC PANEL     Status: Abnormal   Collection Time    01/08/14  5:36 PM      Result Value Ref Range   Sodium 138  137 - 147 mEq/L   Potassium 4.5  3.7 - 5.3 mEq/L   Chloride 102  96 - 112 mEq/L   CO2 20  19 - 32 mEq/L   Glucose, Bld 97  70 - 99 mg/dL   BUN 11  6 - 23 mg/dL   Creatinine, Ser 0.95  0.50 - 1.35 mg/dL   Calcium 9.4  8.4 - 10.5 mg/dL   GFR calc non Af Amer 89 (*) >90  mL/min   GFR calc Af Amer >90  >90 mL/min   Comment: (NOTE)     The eGFR has been calculated using the CKD EPI equation.     This calculation has not been validated in all clinical situations.     eGFR's persistently <90 mL/min signify possible Chronic Kidney     Disease.  HIV ANTIBODY (ROUTINE TESTING)     Status: None   Collection Time    01/08/14 10:23 PM      Result Value Ref Range   HIV 1&2 Ab, 4th Generation NON REACTIVE  NON REACTIVE   Comment: (NOTE)     A NON-REACTIVE HIV Ag/Ab result does not exclude HIV infection since     the time frame for seroconversion is variable. If acute HIV infection     is suspected, a HIV-1 RNA Qualitative TMA test is recommended.     HIV-1/2 Antibody Diff         Not indicated.     HIV-1 RNA, Qual TMA           Not indicated.     PLEASE NOTE: This information has been disclosed to you from records     whose confidentiality may be protected by state law. If your state     requires such protection, then the state law prohibits you from making     any further disclosure of the information without the specific written     consent of the person to whom it pertains, or as otherwise permitted     by law. A general authorization for the release of medical or other     information is NOT sufficient for this purpose.     The performance of this assay has not been clinically validated in     patients less than 31 years old.     Performed at [facility] PANEL     Status: Abnormal   Collection Time    01/08/14 10:23 PM      Result Value Ref Range   Total Protein 7.7  6.0 - 8.3 g/dL   Albumin 3.3 (*) 3.5 - 5.2 g/dL   AST 18  0 -  37 U/L   ALT 9  0 - 53 U/L   Alkaline Phosphatase 38 (*) 39 - 117 U/L   Total Bilirubin 0.6  0.3 - 1.2 mg/dL   Bilirubin, Direct <0.2  0.0 - 0.3 mg/dL   Indirect Bilirubin NOT CALCULATED  0.3 - 0.9 mg/dL  ETHANOL     Status: None   Collection Time    01/08/14 10:23 PM      Result Value Ref Range    Alcohol, Ethyl (B) <11  0 - 11 mg/dL   Comment:            LOWEST DETECTABLE LIMIT FOR     SERUM ALCOHOL IS 11 mg/dL     FOR MEDICAL PURPOSES ONLY  MAGNESIUM     Status: None   Collection Time    01/08/14 10:23 PM      Result Value Ref Range   Magnesium 2.0  1.5 - 2.5 mg/dL  PHOSPHORUS     Status: None   Collection Time    01/08/14 10:23 PM      Result Value Ref Range   Phosphorus 3.9  2.3 - 4.6 mg/dL  URINE RAPID DRUG SCREEN (HOSP PERFORMED)     Status: Abnormal   Collection Time    01/09/14  3:35 AM      Result Value Ref Range   Opiates NONE DETECTED  NONE DETECTED   Cocaine NONE DETECTED  NONE DETECTED   Benzodiazepines NONE DETECTED  NONE DETECTED   Amphetamines NONE DETECTED  NONE DETECTED   Tetrahydrocannabinol POSITIVE (*) NONE DETECTED   Barbiturates NONE DETECTED  NONE DETECTED   Comment:            DRUG SCREEN FOR MEDICAL PURPOSES     ONLY.  IF CONFIRMATION IS NEEDED     FOR ANY PURPOSE, NOTIFY LAB     WITHIN 5 DAYS.                LOWEST DETECTABLE LIMITS     FOR URINE DRUG SCREEN     Drug Class       Cutoff (ng/mL)     Amphetamine      1000     Barbiturate      200     Benzodiazepine   229     Tricyclics       798     Opiates          300     Cocaine          300     THC              50  URINALYSIS, ROUTINE W REFLEX MICROSCOPIC     Status: None   Collection Time    01/09/14  3:35 AM      Result Value Ref Range   Color, Urine YELLOW  YELLOW   APPearance CLEAR  CLEAR   Specific Gravity, Urine 1.018  1.005 - 1.030   pH 6.0  5.0 - 8.0   Glucose, UA NEGATIVE  NEGATIVE mg/dL   Hgb urine dipstick NEGATIVE  NEGATIVE   Bilirubin Urine NEGATIVE  NEGATIVE   Ketones, ur NEGATIVE  NEGATIVE mg/dL   Protein, ur NEGATIVE  NEGATIVE mg/dL   Urobilinogen, UA 1.0  0.0 - 1.0 mg/dL   Nitrite NEGATIVE  NEGATIVE   Leukocytes, UA NEGATIVE  NEGATIVE   Comment: MICROSCOPIC NOT DONE ON URINES WITH NEGATIVE PROTEIN, BLOOD, LEUKOCYTES, NITRITE, OR GLUCOSE <1000 mg/dL.   HEPATITIS  PANEL, ACUTE     Status: None   Collection Time    01/09/14  6:40 AM      Result Value Ref Range   Hepatitis B Surface Ag NEGATIVE  NEGATIVE   HCV Ab NEGATIVE  NEGATIVE   Hep A IgM NON REACTIVE  NON REACTIVE   Hep B C IgM NON REACTIVE  NON REACTIVE   Comment: (NOTE)     High levels of Hepatitis B Core IgM antibody are detectable     during the acute stage of Hepatitis B. This antibody is used     to differentiate current from past HBV infection.     Performed at [facility]     Status: None   Collection Time    01/09/14  6:40 AM      Result Value Ref Range   Prothrombin Time 14.4  11.6 - 15.2 seconds   INR 1.14  0.00 - 1.49  APTT     Status: None   Collection Time    01/09/14  6:40 AM      Result Value Ref Range   aPTT 29  24 - 37 seconds  COMPREHENSIVE METABOLIC PANEL     Status: Abnormal   Collection Time    01/09/14  6:40 AM      Result Value Ref Range   Sodium 141  137 - 147 mEq/L   Potassium 4.1  3.7 - 5.3 mEq/L   Chloride 105  96 - 112 mEq/L   CO2 22  19 - 32 mEq/L   Glucose, Bld 90  70 - 99 mg/dL   BUN 10  6 - 23 mg/dL   Creatinine, Ser 0.98  0.50 - 1.35 mg/dL   Calcium 9.0  8.4 - 10.5 mg/dL   Total Protein 7.6  6.0 - 8.3 g/dL   Albumin 3.1 (*) 3.5 - 5.2 g/dL   AST 19  0 - 37 U/L   ALT 10  0 - 53 U/L   Alkaline Phosphatase 41  39 - 117 U/L   Total Bilirubin 1.0  0.3 - 1.2 mg/dL   GFR calc non Af Amer 88 (*) >90 mL/min   GFR calc Af Amer >90  >90 mL/min   Comment: (NOTE)     The eGFR has been calculated using the CKD EPI equation.     This calculation has not been validated in all clinical situations.     eGFR's persistently <90 mL/min signify possible Chronic Kidney     Disease.  CBC     Status: Abnormal   Collection Time    01/09/14  6:40 AM      Result Value Ref Range   WBC 5.5  4.0 - 10.5 K/uL   RBC 2.85 (*) 4.22 - 5.81 MIL/uL   Hemoglobin 5.9 (*) 13.0 - 17.0 g/dL   Comment: REPEATED TO VERIFY     POST TRANSFUSION  SPECIMEN     CRITICAL VALUE NOTED.  VALUE IS CONSISTENT WITH PREVIOUSLY REPORTED AND CALLED VALUE.   HCT 19.1 (*) 39.0 - 52.0 %   MCV 67.0 (*) 78.0 - 100.0 fL   Comment: POST TRANSFUSION SPECIMEN   MCH 20.7 (*) 26.0 - 34.0 pg   MCHC 30.9  30.0 - 36.0 g/dL   RDW NOT CALCULATED  11.5 - 15.5 %   Platelets 561 (*) 150 - 400 K/uL  TROPONIN I     Status: None   Collection Time    01/09/14  8:48 AM  Result Value Ref Range   Troponin I <0.30  <0.30 ng/mL   Comment:            Due to the release kinetics of cTnI,     a negative result within the first hours     of the onset of symptoms does not rule out     myocardial infarction with certainty.     If myocardial infarction is still suspected,     repeat the test at appropriate intervals.  PREPARE RBC (CROSSMATCH)     Status: None   Collection Time    01/09/14  9:12 AM      Result Value Ref Range   Order Confirmation ORDER PROCESSED BY BLOOD BANK    TROPONIN I     Status: None   Collection Time    01/09/14  2:12 PM      Result Value Ref Range   Troponin I <0.30  <0.30 ng/mL   Comment:            Due to the release kinetics of cTnI,     a negative result within the first hours     of the onset of symptoms does not rule out     myocardial infarction with certainty.     If myocardial infarction is still suspected,     repeat the test at appropriate intervals.  CBC     Status: Abnormal   Collection Time    01/09/14  3:20 PM      Result Value Ref Range   WBC 6.6  4.0 - 10.5 K/uL   RBC 3.42 (*) 4.22 - 5.81 MIL/uL   Hemoglobin 7.6 (*) 13.0 - 17.0 g/dL   HCT 36.4 (*) 83.8 - 93.0 %   MCV 69.3 (*) 78.0 - 100.0 fL   MCH 22.2 (*) 26.0 - 34.0 pg   MCHC 32.1  30.0 - 36.0 g/dL   RDW NOT CALCULATED  68.4 - 15.5 %   Platelets 628 (*) 150 - 400 K/uL   Dg Chest 2 View  01/08/2014   CLINICAL DATA:  Increasing shortness of breath. Worse with exertion.  EXAM: CHEST  2 VIEW  COMPARISON:  None.  FINDINGS: COPD with mild hyperinflation. No  active infiltrates or failure. Normal cardiomediastinal silhouette. No osseous findings.  IMPRESSION: COPD, no active disease.   Electronically Signed   By: Davonna Belling M.D.   On: 01/08/2014 14:06   Review of Systems  Constitutional: Positive for malaise/fatigue. Negative for fever, chills and weight loss.  HENT: Negative.   Eyes: Negative.   Respiratory: Positive for shortness of breath. Negative for cough, hemoptysis, sputum production and wheezing.   Cardiovascular: Positive for palpitations. Negative for chest pain, orthopnea, claudication and leg swelling.  Gastrointestinal: Negative.   Genitourinary: Negative.   Musculoskeletal: Negative.   Skin: Negative.   Neurological: Positive for weakness.  Endo/Heme/Allergies: Negative.   Psychiatric/Behavioral: Negative.    Blood pressure 143/90, pulse 91, temperature 98.6 F (37 C), temperature source Oral, resp. rate 18, height 5\' 10"  (1.778 m), weight 71.85 kg (158 lb 6.4 oz), SpO2 100.00%. Physical Exam  Constitutional: He is oriented to person, place, and time. He appears well-developed and well-nourished.  HENT:  Head: Normocephalic and atraumatic.  Eyes: Conjunctivae and EOM are normal. Pupils are equal, round, and reactive to light.  Neck: Normal range of motion. Neck supple.  Cardiovascular: Normal rate and regular rhythm.   Respiratory: Effort normal and breath sounds normal.  GI: Soft. Bowel sounds  are normal. He exhibits no distension and no mass. There is no tenderness. There is no rebound and no guarding.  Musculoskeletal: Normal range of motion.  Neurological: He is alert and oriented to person, place, and time.  Skin: Skin is warm and dry.  Psychiatric: He has a normal mood and affect. His behavior is normal. Judgment and thought content normal.   Assessment/Plan: Severe iron deficiency anemia: will proceed with an EGD/Colonoscopy tomorrow.   William Jones 01/09/2014, 6:38 PM

## 2014-01-09 NOTE — Progress Notes (Signed)
CRITICAL VALUE ALERT  Critical value received:  Hemoglobin level 5.9  Date of notification:  01/09/14  Time of notification: 0830  Critical value read back:yes Huel Cote  Nurse who received alert:  Lenn Cal  MD notified (1st page):  Dr. Ronnald Ramp  Time of first page:  ) 0840  MD notified (2nd page):  Time of second page:  Responding MD:   Time MD responded: order for PRBCs put in at [facility]

## 2014-01-09 NOTE — Progress Notes (Signed)
  Date: 01/09/2014  Patient name: William Jones record number: [id number]  Date of birth: June 10, 1951   I have seen and evaluated William Jones and discussed their care with the Residency Team. Mr William Jones has had several yrs of pica and 2 weeks of DOE, dyspnea at rest, fatigue, and CP. He was found to have a HgB of 3.3. Last recorded HgB was 15.5 in 12/2009. He denies any sxs / signs of blood loss. He has never had a colonoscopy. He drinks ETOH QD but denies GERD sxs. He is on disability from pain from frost bite.   Of note, he states he was put on B vitamin for the pain after the frost bite but was told to stop it couple of yrs ago.   He also has urgent BM after PO intake - sometimes runny, sometimes formed. Up to 7 per day.   Assessment and Plan: I have seen and evaluated the patient as outlined above. I agree with the formulated Assessment and Plan as detailed in the residents' admission note, with the following changes:   1. Severe iron deficiency anemia -   Labs do not indicate any hemolysis  Other cell lines nl or high and there are retics so no bone marrow failure  HgB was previously nl R/O chronic issue like Sickle cell  Iron studies support the dx of Fe def. Heme was negative but will get endoscopy in AM per GI.   I do not think malabsorption is completely R/O. Vit B 12 was nl but was likely taking Po B 12 up until 2 yrs ago so maybe still has adequate stores?   2. ETOH - start PPI as could have gastritis even though asymptomatic. If EGD is nl, stop the PPI.   William Crews, MD 4/8/20153:38 PM

## 2014-01-09 NOTE — Progress Notes (Signed)
UR completed.  Tollie Canada, RN BSN [facility] CCM Trauma/Neuro ICU Case Manager [phone]  

## 2014-01-10 ENCOUNTER — Encounter (HOSPITAL_COMMUNITY)
Admission: EM | Disposition: A | Discharge status: Home / Self Care | Payer: Self-pay | Source: Home / Self Care | Attending: Internal Medicine

## 2014-01-10 ENCOUNTER — Encounter (HOSPITAL_COMMUNITY): Payer: Medicare Other | Admitting: Anesthesiology

## 2014-01-10 ENCOUNTER — Inpatient Hospital Stay (HOSPITAL_COMMUNITY): Payer: Medicare Other | Admitting: Anesthesiology

## 2014-01-10 ENCOUNTER — Encounter (HOSPITAL_COMMUNITY): Payer: Self-pay | Admitting: Gastroenterology

## 2014-01-10 DIAGNOSIS — D509 Iron deficiency anemia, unspecified: Secondary | ICD-10-CM | POA: Diagnosis not present

## 2014-01-10 DIAGNOSIS — K2980 Duodenitis without bleeding: Secondary | ICD-10-CM | POA: Diagnosis not present

## 2014-01-10 DIAGNOSIS — D126 Benign neoplasm of colon, unspecified: Secondary | ICD-10-CM | POA: Diagnosis not present

## 2014-01-10 DIAGNOSIS — K5730 Diverticulosis of large intestine without perforation or abscess without bleeding: Secondary | ICD-10-CM | POA: Diagnosis not present

## 2014-01-10 HISTORY — PX: GIVENS CAPSULE STUDY: SHX5432

## 2014-01-10 HISTORY — PX: ESOPHAGOGASTRODUODENOSCOPY: SHX5428

## 2014-01-10 HISTORY — PX: COLONOSCOPY: SHX5424

## 2014-01-10 LAB — CBC
HCT: 23 % — ABNORMAL LOW (ref 39.0–52.0)
HCT: 22.2 % — ABNORMAL LOW (ref 39.0–52.0)
HEMOGLOBIN: 7 g/dL — AB (ref 13.0–17.0)
Hemoglobin: 7.3 g/dL — ABNORMAL LOW (ref 13.0–17.0)
MCH: 22.1 pg — ABNORMAL LOW (ref 26.0–34.0)
MCH: 22 pg — ABNORMAL LOW (ref 26.0–34.0)
MCHC: 31.7 g/dL (ref 30.0–36.0)
MCHC: 31.5 g/dL (ref 30.0–36.0)
MCV: 69.5 fL — AB (ref 78.0–100.0)
MCV: 69.8 fL — ABNORMAL LOW (ref 78.0–100.0)
PLATELETS: 660 10*3/uL — AB (ref 150–400)
PLATELETS: 670 10*3/uL — AB (ref 150–400)
RBC: 3.31 MIL/uL — ABNORMAL LOW (ref 4.22–5.81)
RBC: 3.18 MIL/uL — ABNORMAL LOW (ref 4.22–5.81)
WBC: 5.9 10*3/uL (ref 4.0–10.5)
WBC: 7.1 10*3/uL (ref 4.0–10.5)

## 2014-01-10 LAB — TYPE AND SCREEN
ABO/RH(D): A POS
Antibody Screen: NEGATIVE
UNIT DIVISION: 0
Unit division: 0
Unit division: 0

## 2014-01-10 LAB — PSA: PSA: 42.92 ng/mL — AB (ref <=4.00)

## 2014-01-10 SURGERY — IMAGING PROCEDURE, GI TRACT, INTRALUMINAL, VIA CAPSULE
Anesthesia: LOCAL

## 2014-01-10 SURGERY — EGD (ESOPHAGOGASTRODUODENOSCOPY)
Anesthesia: Moderate Sedation

## 2014-01-10 SURGERY — COLONOSCOPY
Anesthesia: Monitor Anesthesia Care

## 2014-01-10 MED ORDER — PROPOFOL INFUSION 10 MG/ML OPTIME
INTRAVENOUS | Status: DC | PRN
  Administered 2014-01-10: 50 ug/kg/min via INTRAVENOUS

## 2014-01-10 MED ORDER — MIDAZOLAM HCL 5 MG/5ML IJ SOLN
INTRAMUSCULAR | Status: DC | PRN
  Administered 2014-01-10: 2 mg via INTRAVENOUS

## 2014-01-10 MED ORDER — LACTATED RINGERS IV SOLN
INTRAVENOUS | Status: DC | PRN
  Administered 2014-01-10: 13:00:00 via INTRAVENOUS

## 2014-01-10 MED ORDER — SODIUM CHLORIDE 0.9 % IV SOLN: INTRAVENOUS | Status: DC

## 2014-01-10 MED ORDER — PROPOFOL 10 MG/ML IV BOLUS
INTRAVENOUS | Status: DC | PRN
  Administered 2014-01-10 (×3): 20 mg via INTRAVENOUS

## 2014-01-10 MED ORDER — LACTATED RINGERS IV SOLN
INTRAVENOUS | Status: DC
  Administered 2014-01-10: 1000 mL via INTRAVENOUS

## 2014-01-10 SURGICAL SUPPLY — 1 items: TOWEL COTTON PACK 4EA (MISCELLANEOUS) ×4 IMPLANT

## 2014-01-10 NOTE — Progress Notes (Signed)
Clinical Social Work Department BRIEF PSYCHOSOCIAL ASSESSMENT 01/10/2014  Patient:  William Jones, William Jones     Account Number:  [id number]     Admit date:  01/08/2014  Clinical Social Worker:  Pete Pelt, Rosine  Date/Time:  01/10/2014 02:05 PM  Referred by:  Physician  Date Referred:  01/09/2014 Referred for  Substance Abuse   Other Referral:   Interview type:  Patient Other interview type:    PSYCHOSOCIAL DATA Living Status:  SIGNIFICANT OTHER Admitted from facility:   Level of care:   Primary support name:  Joyce Copa  [phone] Primary support relationship to patient:  PARTNER Degree of support available:   Pt stated that he has a great support system from his girlfriend and that "she was the one that encouraged me to come in".    CURRENT CONCERNS Current Concerns  Substance Abuse   Other Concerns:    SOCIAL WORK ASSESSMENT / PLAN CSW and BSW student met with the Pt at the bedside to discuss consult for alcohol abuse. CSW intoduced self and reason for visit. Pt stated that he was expecting a visit from the [place] to "talk about stopping drinking." Pt seemed motivated to speak with CSW about his alcoholism as exhibited by pulling a chair up to [place] and engaging in conversation about the topic. Pt stated that "he drinks every day" and Pt "drinks about a pint of whiskey or vodka". Pt stated that "he has been drinking since age 63." Pt feels that "the drinking is probably why I'm in here and if it wasn't for my friend I wouldn't have even come in." Pt tries to stay out of the hospital but "knows his drinking is hurting him".  CSW encouraged the Pt to consider alcohol rehabilitation and recommended that the Pt start with AA meetings and also provided Pt with a listing of IOP/PHP/Residential treatment opions. Pt was anxious (as exhibited by fidgeting and rubbing palms together) about recovery, however seems to be accepting information and recommendations from [place].    Assessment/plan status:  Information/Referral to [employer] Other assessment/ plan:   Information/referral to community resources:   CSW provided the Pt with a listing of substance abuse treatment programs in the local area and also a listing of side effects from drug/alcohol abuse.    PATIENT'S/FAMILY'S RESPONSE TO PLAN OF CARE: Pt was encouraged after meeting with CSW and thanked CSW for setting down and speaking with Pt about recovery. No further needs at this time.       [facility]  [address];  [phone] Phone: [phone]

## 2014-01-10 NOTE — Op Note (Addendum)
[facility] [place], [address]   OPERATIVE PROCEDURE REPORT  PATIENT: William Jones, William Jones  MR#: [id number] BIRTHDATE: Dec 04, 1950  GENDER: Male ENDOSCOPIST: Carol Ada, MD ASSISTANT:   Corwin Levins, RN and Tedra Coupe, PROCEDURE DATE: 01/10/2014 PROCEDURE:   EGD w/ biopsy ASA CLASS:   Class III INDICATIONS:Anemia. MEDICATIONS: MAC sedation, administered by CRNA TOPICAL ANESTHETIC:   none  DESCRIPTION OF PROCEDURE:   After the risks benefits and alternatives of the procedure were thoroughly explained, informed consent was obtained.  The Pentax Gastroscope [id number]  endoscope was introduced through the mouth  and advanced to the second portion of the duodenum Without limitations.      The instrument was slowly withdrawn as the mucosa was fully examined.    FINDINGS:  The upper, middle and distal third of the esophagus were carefully inspected and no abnormalities were noted.  The z-line was well seen at the GEJ.  The endoscope was pushed into the fundus which was normal including a retroflexed view.  The antrum, gastric body, first and second part of the duodenum were unremarkable. Cold biopsies of the duodenum were obtained to evaluate for Celiac disease.   Retroflexed views revealed no abnormalities.     The scope was then withdrawn from the patient and the procedure terminated.  COMPLICATIONS: There were no complications.  IMPRESSION:Normal EGD  RECOMMENDATIONS: 1) Proceed with the colonoscopy. 2) Follow up small bowel biopsies.   _______________________________ Lorrin MaisCarol Ada, MD 01/10/2014 2:00 PM  Revised: 01/10/2014 2:00 PM

## 2014-01-10 NOTE — Progress Notes (Signed)
Subjective: Patient seen at bedside this AM. Says he feels significantly improved this morning. Claims his lightheadedness, weakness, and fatigue has resolved almost completely. No longer w/ DOE.   Patient is now s/p 3 units PRBC's. Repeat CBC w/ Hb 7.3.  Objective: Vital signs in last 24 hours: Filed Vitals:   01/09/14 1819 01/09/14 2124 01/10/14 0516 01/10/14 0527  BP: 143/90 132/87 123/73 108/56  Pulse: 91 86 91 87  Temp: 98.6 F (37 C) 98.6 F (37 C) 98.6 F (37 C) 97.9 F (36.6 C)  TempSrc: Oral Oral Oral Oral  Resp: 18 17 17 18   Height:      Weight:      SpO2: 100% 100% 100% 97%   Weight change:   Intake/Output Summary (Last 24 hours) at 01/10/14 1208 Last data filed at 01/10/14 0948  Gross per 24 hour  Intake    480 ml  Output      0 ml  Net    480 ml   Physical Exam: General: Alert, cooperative, NAD. HEENT: PERRL, EOMI. Pale conjunctiva. Neck: Full range of motion without pain, supple, no lymphadenopathy or carotid bruits.  Lungs: Clear to ascultation bilaterally, normal work of respiration, no wheezes, rales, rhonchi Heart: RRR, no murmurs, gallops, or rubs Abdomen: Soft, non-tender, non-distended, BS + Extremities: No cyanosis, clubbing, or edema. Neurologic: Alert & oriented X3, cranial nerves II-XII intact, strength grossly intact, sensation intact to light touch  Lab Results: Basic Metabolic Panel:  Recent Labs Lab 01/08/14 1736 01/08/14 2223 01/09/14 0640  NA 138  --  141  K 4.5  --  4.1  CL 102  --  105  CO2 20  --  22  GLUCOSE 97  --  90  BUN 11  --  10  CREATININE 0.95  --  0.98  CALCIUM 9.4  --  9.0  MG  --  2.0  --   PHOS  --  3.9  --    Liver Function Tests:  Recent Labs Lab 01/08/14 2223 01/09/14 0640  AST 18 19  ALT 9 10  ALKPHOS 38* 41  BILITOT 0.6 1.0  PROT 7.7 7.6  ALBUMIN 3.3* 3.1*   CBC:  Recent Labs Lab 01/09/14 1520 01/10/14 0527  WBC 6.6 5.9  HGB 7.6* 7.3*  HCT 23.7* 23.0*  MCV 69.3* 69.5*  PLT 628*  660*   Cardiac Enzymes:  Recent Labs Lab 01/09/14 0848 01/09/14 1412  TROPONINI <0.30 <0.30   BNP:  Recent Labs Lab 01/08/14 1559  PROBNP 419.2*   Coagulation:  Recent Labs Lab 01/09/14 0640  LABPROT 14.4  INR 1.14   Anemia Panel:  Recent Labs Lab 01/08/14 1702  VITAMINB12 573  FOLATE 11.3  FERRITIN 3*  TIBC NOT CALC  IRON <10*  RETICCTPCT 2.4   Urine Drug Screen: Drugs of Abuse     Component Value Date/Time   LABOPIA NONE DETECTED 01/09/2014 0335   COCAINSCRNUR NONE DETECTED 01/09/2014 0335   LABBENZ NONE DETECTED 01/09/2014 0335   AMPHETMU NONE DETECTED 01/09/2014 0335   THCU POSITIVE* 01/09/2014 0335   LABBARB NONE DETECTED 01/09/2014 0335    Alcohol Level:  Recent Labs Lab 01/08/14 2223  ETH <11   Urinalysis:  Recent Labs Lab 01/09/14 0335  COLORURINE YELLOW  LABSPEC 1.018  PHURINE 6.0  GLUCOSEU NEGATIVE  HGBUR NEGATIVE  BILIRUBINUR NEGATIVE  KETONESUR NEGATIVE  PROTEINUR NEGATIVE  UROBILINOGEN 1.0  NITRITE NEGATIVE  LEUKOCYTESUR NEGATIVE   Studies/Results: Dg Chest 2 View  01/08/2014  CLINICAL DATA:  Increasing shortness of breath. Worse with exertion.  EXAM: CHEST  2 VIEW  COMPARISON:  None.  FINDINGS: COPD with mild hyperinflation. No active infiltrates or failure. Normal cardiomediastinal silhouette. No osseous findings.  IMPRESSION: COPD, no active disease.   Electronically Signed   By: Rolla Flatten M.D.   On: 01/08/2014 14:06   Medications: I have reviewed the patient's current medications. Scheduled Meds: . ferrous sulfate  325 mg Oral TID WC  . folic acid  1 mg Oral Daily  . iron dextran (INFED/DEXFERRUM) infusion  1,000 mg Intravenous Daily  . multivitamin with minerals  1 tablet Oral Daily  . pantoprazole  40 mg Oral Daily  . sodium chloride  3 mL Intravenous Q12H  . thiamine  100 mg Oral Daily   Or  . thiamine  100 mg Intravenous Daily   Continuous Infusions: . sodium chloride 20 mL/hr at 01/09/14 1112   PRN  Meds:.LORazepam, LORazepam  Assessment/Plan: William Jones is a 63 y.o. male w/ no known PMHx, admitted w/ symptomatic anemia, Hb of 3.3, found to have severe Iron Deficiency Anemia.   Iron Deficiency Anemia- Patient admitted w/ symptomatic anemia w/ Hb of 3.3, MCV of 57. FOBT negative in ED. Iron profile performed, showed Iron <10, TIBC unable to be calculated, and Ferritin of 3. Folate and B12 wnl (11.3 and 573, respectively). Etiology unclear at this time, however GI malignancy in question until proven otherwise. Feels much better this morning. -For EGD/colonoscopy today. -Transfuse 1 unit PRBC's for Hb < 7.0. -Continue Iron Dextran 1g IV for 3 days total + Ferrous Sulfate as above. Will d/c w/ only Ferrous Sulfate.  Elevated PSA- PSA in 2011 shown to be 35, on repeat 43 during this admission. -Will need further outpatient workup for this.  Alcohol Abuse- On admission, patient admits to excessive alcohol use.  -Continue CIWA -Social Work consulted -Continue Thiamine + Folate + MTV  DVT/PE PPx- SCD's  Dispo: Disposition is deferred at this time, awaiting improvement of current medical problems.  Anticipated discharge in approximately 1-2 day(s).   The patient does have a current PCP (Becky Sax, MD) and does not need an [facility] hospital follow-up appointment after discharge.  The patient does not have transportation limitations that hinder transportation to clinic appointments.  .Services Needed at time of discharge: Y = Yes, Blank = No PT:   OT:   RN:   Equipment:   Other:     LOS: 2 days   Corky Sox, MD 01/10/2014, 12:08 PM

## 2014-01-10 NOTE — Anesthesia Postprocedure Evaluation (Signed)
  Anesthesia Post-op Note  Patient: William Jones [facility]  Procedure(s) Performed: Procedure(s): COLONOSCOPY (N/A) ESOPHAGOGASTRODUODENOSCOPY (EGD) (N/A)  Patient Location: PACU and Endoscopy Unit  Anesthesia Type:MAC  Level of Consciousness: lethargic and responds to stimulation  Airway and Oxygen Therapy: Patient Spontanous Breathing and Patient connected to nasal cannula oxygen  Post-op Pain: none  Post-op Assessment: Post-op Vital signs reviewed, Patient's Cardiovascular Status Stable and RESPIRATORY FUNCTION UNSTABLE  Post-op Vital Signs: Reviewed and stable  Last Vitals:  Filed Vitals:   01/10/14 1232  BP: 145/86  Pulse: 80  Temp: 37 C  Resp: 20    Complications: No apparent anesthesia complications

## 2014-01-10 NOTE — H&P (View-Only) (Signed)
Subjective: Patient seen at bedside this AM. Says he feels significantly improved this morning. Claims his lightheadedness, weakness, and fatigue has resolved almost completely. No longer w/ DOE.   Patient is now s/p 3 units PRBC's. Repeat CBC w/ Hb 7.3.  Objective: Vital signs in last 24 hours: Filed Vitals:   01/09/14 1819 01/09/14 2124 01/10/14 0516 01/10/14 0527  BP: 143/90 132/87 123/73 108/56  Pulse: 91 86 91 87  Temp: 98.6 F (37 C) 98.6 F (37 C) 98.6 F (37 C) 97.9 F (36.6 C)  TempSrc: Oral Oral Oral Oral  Resp: 18 17 17 18   Height:      Weight:      SpO2: 100% 100% 100% 97%   Weight change:   Intake/Output Summary (Last 24 hours) at 01/10/14 1208 Last data filed at 01/10/14 0948  Gross per 24 hour  Intake    480 ml  Output      0 ml  Net    480 ml   Physical Exam: General: Alert, cooperative, NAD. HEENT: PERRL, EOMI. Pale conjunctiva. Neck: Full range of motion without pain, supple, no lymphadenopathy or carotid bruits.  Lungs: Clear to ascultation bilaterally, normal work of respiration, no wheezes, rales, rhonchi Heart: RRR, no murmurs, gallops, or rubs Abdomen: Soft, non-tender, non-distended, BS + Extremities: No cyanosis, clubbing, or edema. Neurologic: Alert & oriented X3, cranial nerves II-XII intact, strength grossly intact, sensation intact to light touch  Lab Results: Basic Metabolic Panel:  Recent Labs Lab 01/08/14 1736 01/08/14 2223 01/09/14 0640  NA 138  --  141  K 4.5  --  4.1  CL 102  --  105  CO2 20  --  22  GLUCOSE 97  --  90  BUN 11  --  10  CREATININE 0.95  --  0.98  CALCIUM 9.4  --  9.0  MG  --  2.0  --   PHOS  --  3.9  --    Liver Function Tests:  Recent Labs Lab 01/08/14 2223 01/09/14 0640  AST 18 19  ALT 9 10  ALKPHOS 38* 41  BILITOT 0.6 1.0  PROT 7.7 7.6  ALBUMIN 3.3* 3.1*   CBC:  Recent Labs Lab 01/09/14 1520 01/10/14 0527  WBC 6.6 5.9  HGB 7.6* 7.3*  HCT 23.7* 23.0*  MCV 69.3* 69.5*  PLT 628*  660*   Cardiac Enzymes:  Recent Labs Lab 01/09/14 0848 01/09/14 1412  TROPONINI <0.30 <0.30   BNP:  Recent Labs Lab 01/08/14 1559  PROBNP 419.2*   Coagulation:  Recent Labs Lab 01/09/14 0640  LABPROT 14.4  INR 1.14   Anemia Panel:  Recent Labs Lab 01/08/14 1702  VITAMINB12 573  FOLATE 11.3  FERRITIN 3*  TIBC NOT CALC  IRON <10*  RETICCTPCT 2.4   Urine Drug Screen: Drugs of Abuse     Component Value Date/Time   LABOPIA NONE DETECTED 01/09/2014 0335   COCAINSCRNUR NONE DETECTED 01/09/2014 0335   LABBENZ NONE DETECTED 01/09/2014 0335   AMPHETMU NONE DETECTED 01/09/2014 0335   THCU POSITIVE* 01/09/2014 0335   LABBARB NONE DETECTED 01/09/2014 0335    Alcohol Level:  Recent Labs Lab 01/08/14 2223  ETH <11   Urinalysis:  Recent Labs Lab 01/09/14 0335  COLORURINE YELLOW  LABSPEC 1.018  PHURINE 6.0  GLUCOSEU NEGATIVE  HGBUR NEGATIVE  BILIRUBINUR NEGATIVE  KETONESUR NEGATIVE  PROTEINUR NEGATIVE  UROBILINOGEN 1.0  NITRITE NEGATIVE  LEUKOCYTESUR NEGATIVE   Studies/Results: Dg Chest 2 View  01/08/2014  CLINICAL DATA:  Increasing shortness of breath. Worse with exertion.  EXAM: CHEST  2 VIEW  COMPARISON:  None.  FINDINGS: COPD with mild hyperinflation. No active infiltrates or failure. Normal cardiomediastinal silhouette. No osseous findings.  IMPRESSION: COPD, no active disease.   Electronically Signed   By: Rolla Flatten M.D.   On: 01/08/2014 14:06   Medications: I have reviewed the patient's current medications. Scheduled Meds: . ferrous sulfate  325 mg Oral TID WC  . folic acid  1 mg Oral Daily  . iron dextran (INFED/DEXFERRUM) infusion  1,000 mg Intravenous Daily  . multivitamin with minerals  1 tablet Oral Daily  . pantoprazole  40 mg Oral Daily  . sodium chloride  3 mL Intravenous Q12H  . thiamine  100 mg Oral Daily   Or  . thiamine  100 mg Intravenous Daily   Continuous Infusions: . sodium chloride 20 mL/hr at 01/09/14 1112   PRN  Meds:.LORazepam, LORazepam  Assessment/Plan: [name] is a 63 y.o. male w/ no known PMHx, admitted w/ symptomatic anemia, Hb of 3.3, found to have severe Iron Deficiency Anemia.   Iron Deficiency Anemia- Patient admitted w/ symptomatic anemia w/ Hb of 3.3, MCV of 57. FOBT negative in ED. Iron profile performed, showed Iron <10, TIBC unable to be calculated, and Ferritin of 3. Folate and B12 wnl (11.3 and 573, respectively). Etiology unclear at this time, however GI malignancy in question until proven otherwise. Feels much better this morning. -For EGD/colonoscopy today. -Transfuse 1 unit PRBC's for Hb < 7.0. -Continue Iron Dextran 1g IV for 3 days total + Ferrous Sulfate as above. Will d/c w/ only Ferrous Sulfate.  Elevated PSA- PSA in 2011 shown to be 35, on repeat 43 during this admission. -Will need further outpatient workup for this.  Alcohol Abuse- On admission, patient admits to excessive alcohol use.  -Continue CIWA -Social Work consulted -Continue Thiamine + Folate + MTV  DVT/PE PPx- SCD's  Dispo: Disposition is deferred at this time, awaiting improvement of current medical problems.  Anticipated discharge in approximately 1-2 day(s).   The patient does have a current PCP (Becky Sax, MD) and does not need an [facility] hospital follow-up appointment after discharge.  The patient does not have transportation limitations that hinder transportation to clinic appointments.  .Services Needed at time of discharge: Y = Yes, Blank = No PT:   OT:   RN:   Equipment:   Other:     LOS: 2 days   Corky Sox, MD 01/10/2014, 12:08 PM

## 2014-01-10 NOTE — Op Note (Addendum)
[facility] [place], [address]   COLONOSCOPY PROCEDURE REPORT  PATIENT: William, Jones  MR#: [id number] BIRTHDATE: [birth year] , 62  yrs. old GENDER: Male ENDOSCOPIST: Carol Ada, MD REFERRED BY: PROCEDURE DATE:  01/10/2014 PROCEDURE:   Colonoscopy with snare polypectomy  ASA CLASS:   Class III INDICATIONS:Severe IDA MEDICATIONS: CRNA, MAC  DESCRIPTION OF PROCEDURE:   After the risks benefits and alternatives of the procedure were thoroughly explained, informed consent was obtained.   Digital examination was normal.       The endoscope was introduced through the anus and advanced to the cecum, which was identified by both the appendix and ileocecal valve. No adverse events experienced.   The quality of the prep was good.  The instrument was then slowly withdrawn as the colon was fully examined.    FINDINGS: A 3 mm sessile descending colon polyp was removed with a cold snare.  No evidence of any masses, inflammation, ulcerations, erosions, or vascular abnormalities.  Retroflexed views revealed no abnormalities. The time to cecum=  .  Withdrawal time=  .  The scope was withdrawn and the procedure completed. COMPLICATIONS: There were no complications.  ENDOSCOPIC IMPRESSION: 1) Colonic polyp.  RECOMMENDATIONS: 1) Await biopsy results. 2) Schedule capsule endoscopy.  eSigned:  Carol Ada, MD 01/10/2014 2:06 PM Revised: 01/10/2014 2:06 PM  cc:

## 2014-01-10 NOTE — Transfer of Care (Signed)
Immediate Anesthesia Transfer of Care Note  Patient: William Jones  Procedure(s) Performed: Procedure(s): COLONOSCOPY (N/A) ESOPHAGOGASTRODUODENOSCOPY (EGD) (N/A)  Patient Location: PACU and Endoscopy Unit  Anesthesia Type:MAC  Level of Consciousness: lethargic and responds to stimulation  Airway & Oxygen Therapy: Patient Spontanous Breathing and Patient connected to nasal cannula oxygen  Post-op Assessment: Report given to PACU RN and Post -op Vital signs reviewed and stable  Post vital signs: Reviewed and stable  Complications: No apparent anesthesia complications

## 2014-01-10 NOTE — Progress Notes (Signed)
  Date: 01/10/2014  Patient name: Darrick Penna [facility]  Medical record number: [id number]  Date of birth: [birth year]   This patient has been seen and the plan of care was discussed with the house staff. Please see their note for complete details. I concur with their findings with the following additions/corrections: Pt feeling much better after 3 units PRBC. HgB now 7.6. EGD and colon showed no etiology for profound anemia. Sm bowel bx still pending. Capsule endoscopy ordered.   Bartholomew Crews, MD 01/10/2014, 2:59 PM

## 2014-01-10 NOTE — Interval H&P Note (Signed)
History and Physical Interval Note:  01/10/2014 1:00 PM  William Jones  has presented today for surgery, with the diagnosis of IDA  The various methods of treatment have been discussed with the patient and family. After consideration of risks, benefits and other options for treatment, the patient has consented to  Procedure(s): COLONOSCOPY (N/A) ESOPHAGOGASTRODUODENOSCOPY (EGD) (N/A) as a surgical intervention .  The patient's history has been reviewed, patient examined, no change in status, stable for surgery.  I have reviewed the patient's chart and labs.  Questions were answered to the patient's satisfaction.     [name]

## 2014-01-10 NOTE — Anesthesia Postprocedure Evaluation (Signed)
  Anesthesia Post-op Note  Patient: William Jones [facility]  Procedure(s) Performed: Procedure(s): COLONOSCOPY (N/A) ESOPHAGOGASTRODUODENOSCOPY (EGD) (N/A)  Patient Location: PACU  Anesthesia Type:MAC  Level of Consciousness: awake, alert  and oriented  Airway and Oxygen Therapy: Patient Spontanous Breathing  Post-op Pain: none  Post-op Assessment: Post-op Vital signs reviewed, Patient's Cardiovascular Status Stable, Respiratory Function Stable, Patent Airway, No signs of Nausea or vomiting and Pain level controlled  Post-op Vital Signs: Reviewed  Last Vitals:  Filed Vitals:   01/10/14 1415  BP: 145/103  Pulse:   Temp:   Resp: 22    Complications: No apparent anesthesia complications

## 2014-01-10 NOTE — Anesthesia Preprocedure Evaluation (Addendum)
Anesthesia Evaluation  Patient identified by MRN, date of birth, ID band Patient awake    Reviewed: Allergy & Precautions, H&P , NPO status , Patient's Chart, lab work & pertinent test results  Airway Mallampati: II TM Distance: >3 FB Neck ROM: Full    Dental no notable dental hx. (+) Chipped,    Pulmonary former smoker,  breath sounds clear to auscultation  Pulmonary exam normal       Cardiovascular negative cardio ROS  Rhythm:Regular Rate:Normal     Neuro/Psych  Neuromuscular disease negative psych ROS   GI/Hepatic (+)     substance abuse  alcohol use and marijuana use, GI Bleeding   Endo/Other  negative endocrine ROS  Renal/GU negative Renal ROS  negative genitourinary   Musculoskeletal negative musculoskeletal ROS (+)   Abdominal   Peds  Hematology  (+) anemia ,   Anesthesia Other Findings   Reproductive/Obstetrics negative OB ROS                          Anesthesia Physical Anesthesia Plan  ASA: III  Anesthesia Plan: MAC   Post-op Pain Management:    Induction: Intravenous  Airway Management Planned: Nasal Cannula  Additional Equipment:   Intra-op Plan:   Post-operative Plan:   Informed Consent: I have reviewed the patients History and Physical, chart, labs and discussed the procedure including the risks, benefits and alternatives for the proposed anesthesia with the patient or authorized representative who has indicated his/her understanding and acceptance.     Plan Discussed with: Anesthesiologist, Surgeon and CRNA  Anesthesia Plan Comments:         Anesthesia Quick Evaluation

## 2014-01-11 ENCOUNTER — Encounter (HOSPITAL_COMMUNITY): Payer: Self-pay | Admitting: Gastroenterology

## 2014-01-11 DIAGNOSIS — D509 Iron deficiency anemia, unspecified: Secondary | ICD-10-CM | POA: Diagnosis not present

## 2014-01-11 DIAGNOSIS — K5520 Angiodysplasia of colon without hemorrhage: Secondary | ICD-10-CM | POA: Diagnosis not present

## 2014-01-11 LAB — UIFE/LIGHT CHAINS/TP QN, 24-HR UR
Albumin, U: DETECTED
Alpha 1, Urine: DETECTED — AB
Alpha 2, Urine: DETECTED — AB
BETA UR: DETECTED — AB
FREE KAPPA LT CHAINS, UR: 10.4 mg/dL — AB (ref 0.14–2.42)
FREE LAMBDA LT CHAINS, UR: 1.03 mg/dL — AB (ref 0.02–0.67)
Free Kappa/Lambda Ratio: 10.1 ratio (ref 2.04–10.37)
GAMMA UR: DETECTED — AB
Total Protein, Urine: 12.2 mg/dL

## 2014-01-11 LAB — CBC
HCT: 23.2 % — ABNORMAL LOW (ref 39.0–52.0)
HCT: 24.5 % — ABNORMAL LOW (ref 39.0–52.0)
HEMOGLOBIN: 7.3 g/dL — AB (ref 13.0–17.0)
Hemoglobin: 7.6 g/dL — ABNORMAL LOW (ref 13.0–17.0)
MCH: 22.2 pg — ABNORMAL LOW (ref 26.0–34.0)
MCH: 22.4 pg — ABNORMAL LOW (ref 26.0–34.0)
MCHC: 31.5 g/dL (ref 30.0–36.0)
MCHC: 31.4 g/dL (ref 30.0–36.0)
MCV: 70.5 fL — ABNORMAL LOW (ref 78.0–100.0)
MCV: 71.4 fL — ABNORMAL LOW (ref 78.0–100.0)
PLATELETS: 767 10*3/uL — AB (ref 150–400)
Platelets: 702 10*3/uL — ABNORMAL HIGH (ref 150–400)
RBC: 3.29 MIL/uL — ABNORMAL LOW (ref 4.22–5.81)
RBC: 3.43 MIL/uL — AB (ref 4.22–5.81)
WBC: 7.5 10*3/uL (ref 4.0–10.5)
WBC: 7.5 10*3/uL (ref 4.0–10.5)

## 2014-01-11 MED ORDER — FERROUS SULFATE 325 (65 FE) MG PO TABS: 325.0000 mg | ORAL_TABLET | Freq: Three times a day (TID) | ORAL | Status: DC

## 2014-01-11 NOTE — Discharge Summary (Signed)
Name: William Jones MRN: [id number] DOB: [birth year] 63 y.o. PCP: Becky Sax, MD  Date of Admission: 01/08/2014  4:26 PM Date of Discharge: 01/11/2014 Attending Physician: Bartholomew Crews, MD  Discharge Diagnosis: 1. Severe Iron Deficiency Anemia- 2. Elevated PSA- 3. Alcohol Abuse-  Discharge Medications:   Medication List    Notice   You have not been prescribed any medications.      Disposition and follow-up:   William Jones was discharged from [facility] in Good condition.  At the hospital follow up visit please address:  1.  Symptomatic anemia- Patient presented w/ Hb of 3.3, w/ dizziness, lightheadedness, weakness, and fatigue. Given 3 units PRBC's, w/ significant improvement in symptoms.  Is patient compliant w/ Iron? Constipation? If patient reports constipation, provide w/ [place]. Not given previously b/c of multiple stools daily.  Elevated PSA- Patient w/ previous PSA elevation, only w/ mild increase on repeat during admission. Patient needs urology outpatient follow up.   Alcohol Abuse- Patient reports excessive alcohol intake, needs resources for [employer].  2.  Labs / imaging needed at time of follow-up: CBC  3.  Pending labs/ test needing follow-up: none  Follow-up Appointments:     Follow-up Information   Follow up with Cresenciano Genre, MD On 01/23/2014. (2:45 PM)    Specialty:  Internal Medicine   Contact information:   [address] [place] [address] [phone]       Discharge Instructions:  Future Appointments Provider Department Dept Phone   01/23/2014 2:45 PM Cresenciano Genre, MD [place] [phone]   02/05/2014 2:30 PM Harriet Masson, Catherine at [place]      Consultations: Treatment Team:  [name], MD  Procedures Performed:  Dg Chest 2 View  01/08/2014   CLINICAL DATA:  Increasing shortness of breath. Worse with  exertion.  EXAM: CHEST  2 VIEW  COMPARISON:  None.  FINDINGS: COPD with mild hyperinflation. No active infiltrates or failure. Normal cardiomediastinal silhouette. No osseous findings.  IMPRESSION: COPD, no active disease.   Electronically Signed   By: Rolla Flatten M.D.   On: 01/08/2014 14:06   Admission HPI: 63 y o male with no significant past medical history, except for frostbite with subsequent leg pains in 2009. Presented to [facility] long ED with complaints of shortness of breath, and was transferred here for evaluation. Shortness of breath started 2 weeks, has been gradually increasing in severity, such that now he can't take a walk from his Bathroom to his bed without getting severely short of breath. Decided to come in today because he said he couldn't do Anything with getting SOB. Uses one pillow to sleep and has for several years, though feels very uncomfortable when he lies flat with chest tightness, and feels much better when he sits up. Patient says he has gained 10-11 pounds of weight, over the past 5 months, and bilateral leg swelling about a month ago but that resolved. Associated cough , No chest pain, no calf pain or redness.  Patient endoses craving ice and corn starch over the past 5-6 years, has never had a colonoscopy, denies dark stools or blood in stools- has a bowel movement 6-7 times, normal formed stool, no history abdominal pain or of vomiting blood, occasional use of Goody powder- last in [place] he had a cold, denies use of NSAIDS, only medication he takes is gabapentin which he hasn't taken in a month-  as he thought this was the cause of his shortness of breath, no family hx of cancer or sickle cell disease or other blood dyscrasias, no prior blood transfusions, no blood in urine or dark urine, no urinary symptoms, no bone pains, no night sweats, no exposure to chemicals or asbestos- worked in [employer], until he retired in 1990. Pt eats 3 meals a day, says he eats vegetables,  meat and fish- essentially a balanced.  Pt has smoked cigarettes since he was a teenager, used to smoke about 6 cigarettes a day, stopped smoking in 2009-about 6 years ago. Patient takes a pint of alcohol whiskey brandy every Day, has been drinking alcohol since he was a teenager, feels he should cut down on his drinking and would like to cut down on his drinking, but denies withdrawal symptoms when he hasn't taken drink, and doesn't take alcohol as an eye opener in the morning, his significant other feels he should cut down his drinking- but he doesn't get angry when she tells him. Denies current drug use, but records show this was in problem in the past.  As per chart review- 01/08/2010- CT abd imaging results- revealed that pt had An abnormal rectal exam with hardened prostate, an elevated PSA of 32.9, No obvious prostatic abnormality by CT, neoplasm was not excluded- given suboptimal evaluation and urology consultation was recommended.   Hospital Course by problem list:   1. Severe Iron Deficiency Anemia- Patient presented w/ symptoms of anemia; lightheadedness, dizziness, fatigue, weakness. Also endorsed pica (craved ice and corn starch). Pallor on exam. Found to have Hb of 3.3 on admission w/ MCV of 57, and reactive thrombocytosis. Given total of 3 units PRBC's for Hb >7.0, w/ significant improvement of symptoms; stated he had almost complete resolution of fatigue, dizziness, and weakness.  Iron profile performed, showed Iron <10, TIBC unable to be calculated, and Ferritin of 3. Folate and B12 wnl (11.3 and 573, respectively). Started on Iron Dextran IV (for 3 days) + Ferrous Sulfate 325 mg po tid. No clear etiology for such severe anemia, however, GI malignancy was in question. Consulted GI, performed EGD, Colonoscopy, and capsule enteroscopy, only revealing small AVM in the distal small bowel, however, not significant enough to result in the patient's findings. Patient did endorse multiple bowel  movements daily, therefore possible etiology is malabsorption of iron. On discharge, continued Ferrous Sulfate and scheduled patient for close follow up in [facility] clinic.   2. Elevated PSA- PSA in 2011 shown to be 35, on repeat 43 during this admission. Given only mild elevation since previous value, do not suspect malignancy at this time. Will continue to follow as an outpatient.   3. Alcohol Abuse- On admission, patient admitted to excessive alcohol use. Denied previous h/o withdrawal symptoms, CAGE 1, saying he felt the need to cut down. CMP showed no significant LFT abnormalities. Ethanol <11 on admission, UDS only positive for THC. Placed on CIWA protocol and given Thiamine + Folate, however, no signs of withdrawal during his admission. Social work consulted.  Discharge Vitals:   BP 130/90  Pulse 90  Temp(Src) 98.2 F (36.8 C) (Oral)  Resp 16  Ht 5\' 10"  (1.778 m)  Wt 158 lb 6.4 oz (71.85 kg)  BMI 22.73 kg/m2  SpO2 100%  Discharge Labs:  Results for orders placed during the hospital encounter of 01/08/14 (from the past 24 hour(s))  CBC     Status: Abnormal   Collection Time    01/10/14  5:00 PM  Result Value Ref Range   WBC 7.1  4.0 - 10.5 K/uL   RBC 3.18 (*) 4.22 - 5.81 MIL/uL   Hemoglobin 7.0 (*) 13.0 - 17.0 g/dL   HCT 22.2 (*) 39.0 - 52.0 %   MCV 69.8 (*) 78.0 - 100.0 fL   MCH 22.0 (*) 26.0 - 34.0 pg   MCHC 31.5  30.0 - 36.0 g/dL   RDW NOT CALCULATED  11.5 - 15.5 %   Platelets 670 (*) 150 - 400 K/uL  CBC     Status: Abnormal   Collection Time    01/11/14  5:00 AM      Result Value Ref Range   WBC 7.5  4.0 - 10.5 K/uL   RBC 3.29 (*) 4.22 - 5.81 MIL/uL   Hemoglobin 7.3 (*) 13.0 - 17.0 g/dL   HCT 23.2 (*) 39.0 - 52.0 %   MCV 70.5 (*) 78.0 - 100.0 fL   MCH 22.2 (*) 26.0 - 34.0 pg   MCHC 31.5  30.0 - 36.0 g/dL   RDW NOT CALCULATED  11.5 - 15.5 %   Platelets 702 (*) 150 - 400 K/uL    Signed: Corky Sox, MD 01/11/2014, 2:08 PM   Time Spent on Discharge: 45  minutes Services Ordered on Discharge: none Equipment Ordered on Discharge: none

## 2014-01-11 NOTE — Discharge Instructions (Signed)
1. You have a follow up appointment scheduled as follows:  Cresenciano Genre, MD  On 01/23/2014 @ 2:45 PM  [place] [id number] [phone]  2. Please take all medications as prescribed. Please take Ferrous Sulfate 325 mg three times daily.   3. If you have worsening of your symptoms or new symptoms arise, please call the clinic ([phone]), or go to the ER immediately if symptoms are severe.  Iron Deficiency Anemia, Adult Anemia is a condition in which there are less red blood cells or hemoglobin in the blood than normal. Hemoglobin is this part of red blood cells that carries oxygen. Iron deficiency anemia is anemia caused by too little iron. It is the most common type of anemia. It may leave you tired and short of breath. CAUSES   Lack of iron in the diet.  Poor absorption of iron, as seen with intestinal disorders.  Intestinal bleeding.  Heavy periods. SIGNS AND SYMPTOMS  Mild anemia may not be noticeable. Symptoms may include:  Fatigue.  Headache.  Pale skin.  Weakness.  Tiredness.  Shortness of breath.  Dizziness.  Cold hands and feet.  Fast or irregular heartbeat. DIAGNOSIS  Diagnosis requires a thorough evaluation and physical exam by your health care provider. Blood tests are generally used to confirm iron deficiency anemia. Additional tests may be done to find the underlying cause of your anemia. These may include:  Testing for blood in the stool (fecal occult blood test).  A procedure to see inside the colon and rectum (colonoscopy).  A procedure to see inside the esophagus and stomach (endoscopy). TREATMENT  Iron deficiency anemia is treated by correcting the cause of the deficiency. Treatment may involve:  Adding iron-rich foods to your diet.  Taking iron supplements. Pregnant or breastfeeding women need to take extra iron, because their normal diet usually does not provide the required amount.  Taking vitamins. Vitamin C improves  the absorption of iron. Your health care provider may recommend taking your iron tablets with a glass of orange juice or vitamin C supplement.  Medicines to make heavy menstrual flow lighter.  Surgery. HOME CARE INSTRUCTIONS   Take iron as directed by your health care provider.  If you cannot tolerate taking iron supplements by mouth, talk to your health care provider about taking them through a vein (intravenously) or an injection into a muscle.  For the best iron absorption, iron supplements should be taken on an empty stomach. If you cannot tolerate them on an empty stomach, you may need to take them with food.  Do not drink milk or take antacids at the same time as your iron supplements. Milk and antacids may interfere with the absorption of iron.  Iron supplements can cause constipation. Make sure to include fiber in your diet to prevent constipation. A stool softener may also be recommended.  Take vitamins as directed by your health care provider.  Eat a diet rich in iron. Foods high in iron include liver, lean beef, whole-grain bread, eggs, dried fruit, and dark green, leafy vegetables. SEEK IMMEDIATE MEDICAL CARE IF:   You faint. If this happens, do not drive. Call your local emergency services (911 in [place] if no other help is available.  You have chest pain.  You feel nauseous or vomit.  You have severe or increased shortness of breath with activity.  You feel weak.  You have a rapid heartbeat.  You have unexplained sweating.  You become lightheaded when getting up from  a chair or bed. MAKE SURE YOU:   Understand these instructions.  Will watch your condition.  Will get help right away if you are not doing well or get worse. Document Released: 09/17/2000 Document Revised: 07/11/2013 Document Reviewed: 05/28/2013 [facility] Patient Information 2014 [place].

## 2014-01-11 NOTE — Progress Notes (Signed)
  Date: 01/11/2014  Patient name: William Jones [facility]  Medical record number: [id number]  Date of birth: 29-Nov-1950   This patient has been seen and the plan of care was discussed with the house staff. Please see their note for complete details. I concur with their findings with the following additions/correction: No cause of severe Fe Def anemia found on EGD, colon, capsule, or sm bowel bx. HgB stable. Ok For D/C home on PO iron and IMC F/U.   Bartholomew Crews, MD 01/11/2014, 5:38 PM

## 2014-01-11 NOTE — Progress Notes (Signed)
Subjective: Patient seen at bedside this AM. Says he is feeling well. Ambulating well, no complaints of pain, dizziness, lightheadedness or weakness.   Objective: Vital signs in last 24 hours: Filed Vitals:   01/10/14 1415 01/10/14 1445 01/10/14 1900 01/11/14 0443  BP: 145/103 134/89 129/76 134/79  Pulse:  77 96 89  Temp:  98.2 F (36.8 C) 98.3 F (36.8 C) 98.2 F (36.8 C)  TempSrc:  Oral Oral Oral  Resp: 22 17 18 16   Height:      Weight:      SpO2:  100% 100% 97%   Weight change:   Intake/Output Summary (Last 24 hours) at 01/11/14 0743 Last data filed at 01/11/14 0600  Gross per 24 hour  Intake   1518 ml  Output   1075 ml  Net    443 ml   Physical Exam: General: Alert, cooperative, NAD. HEENT: PERRL, EOMI. Pale conjunctiva. Neck: Full range of motion without pain, supple, no lymphadenopathy or carotid bruits.  Lungs: Clear to ascultation bilaterally, normal work of respiration, no wheezes, rales, rhonchi Heart: RRR, no murmurs, gallops, or rubs Abdomen: Soft, non-tender, non-distended, BS + Extremities: No cyanosis, clubbing, or edema. Neurologic: Alert & oriented X3, cranial nerves II-XII intact, strength grossly intact, sensation intact to light touch  Lab Results: Basic Metabolic Panel:  Recent Labs Lab 01/08/14 1736 01/08/14 2223 01/09/14 0640  NA 138  --  141  K 4.5  --  4.1  CL 102  --  105  CO2 20  --  22  GLUCOSE 97  --  90  BUN 11  --  10  CREATININE 0.95  --  0.98  CALCIUM 9.4  --  9.0  MG  --  2.0  --   PHOS  --  3.9  --    Liver Function Tests:  Recent Labs Lab 01/08/14 2223 01/09/14 0640  AST 18 19  ALT 9 10  ALKPHOS 38* 41  BILITOT 0.6 1.0  PROT 7.7 7.6  ALBUMIN 3.3* 3.1*   CBC:  Recent Labs Lab 01/10/14 0527 01/10/14 1700  WBC 5.9 7.1  HGB 7.3* 7.0*  HCT 23.0* 22.2*  MCV 69.5* 69.8*  PLT 660* 670*   Cardiac Enzymes:  Recent Labs Lab 01/09/14 0848 01/09/14 1412  TROPONINI <0.30 <0.30   BNP:  Recent  Labs Lab 01/08/14 1559  PROBNP 419.2*   Coagulation:  Recent Labs Lab 01/09/14 0640  LABPROT 14.4  INR 1.14   Anemia Panel:  Recent Labs Lab 01/08/14 1702  VITAMINB12 573  FOLATE 11.3  FERRITIN 3*  TIBC NOT CALC  IRON <10*  RETICCTPCT 2.4   Urine Drug Screen: Drugs of Abuse     Component Value Date/Time   LABOPIA NONE DETECTED 01/09/2014 0335   COCAINSCRNUR NONE DETECTED 01/09/2014 0335   LABBENZ NONE DETECTED 01/09/2014 0335   AMPHETMU NONE DETECTED 01/09/2014 0335   THCU POSITIVE* 01/09/2014 0335   LABBARB NONE DETECTED 01/09/2014 0335    Alcohol Level:  Recent Labs Lab 01/08/14 2223  ETH <11   Urinalysis:  Recent Labs Lab 01/09/14 0335  COLORURINE YELLOW  LABSPEC 1.018  PHURINE 6.0  GLUCOSEU NEGATIVE  HGBUR NEGATIVE  BILIRUBINUR NEGATIVE  KETONESUR NEGATIVE  PROTEINUR NEGATIVE  UROBILINOGEN 1.0  NITRITE NEGATIVE  LEUKOCYTESUR NEGATIVE    Medications: I have reviewed the patient's current medications. Scheduled Meds: . ferrous sulfate  325 mg Oral TID WC  . folic acid  1 mg Oral Daily  . iron dextran (INFED/DEXFERRUM)  infusion  1,000 mg Intravenous Daily  . multivitamin with minerals  1 tablet Oral Daily  . sodium chloride  3 mL Intravenous Q12H  . thiamine  100 mg Oral Daily   Or  . thiamine  100 mg Intravenous Daily   Continuous Infusions: . sodium chloride 20 mL/hr at 01/10/14 1706   PRN Meds:.LORazepam, LORazepam  Assessment/Plan: William Jones is a 63 y.o. male w/ no known PMHx, admitted w/ symptomatic anemia, Hb of 3.3, found to have severe Iron Deficiency Anemia.   Iron Deficiency Anemia- Hb 7.3 today, improved from 7.0 yesterday. Had EGD/Colonoscopy/capsule endoscopy yesterday, only significant for small distal small bowel AVM, likely not significant for severity of iron deficiency anemia. Stable for discharge at this time.  -Continue Iron Dextran 1g IV for 1 dose + Ferrous Sulfate 325 mg tid, to be continued on discharge.    Alcohol Abuse- On admission, patient admits to excessive alcohol use.  -Continue CIWA -Social Work consulted -Continue Thiamine + Folate + MTV  Elevated PSA- PSA in 2011 shown to be 35, on repeat 43 during this admission. -Will need further outpatient workup for this.   DVT/PE PPx- SCD's  Dispo:  Anticipated discharge today.  The patient does have a current PCP (Becky Sax, MD) and does not need an [facility] hospital follow-up appointment after discharge.  The patient does not have transportation limitations that hinder transportation to clinic appointments.  .Services Needed at time of discharge: Y = Yes, Blank = No PT:   OT:   RN:   Equipment:   Other:     LOS: 3 days   Corky Sox, MD 01/11/2014, 7:43 AM

## 2014-01-11 NOTE — Progress Notes (Signed)
Capsule endo revealed a small nonbleeding AVM that is too far distally to reach with standard endoscopes.  I do not believe that this the source of the bleeding.  No further GI evaluation at this time.  He can follow up with his PCP with routine CBCs and remain on iron supplementation.  Signing off.

## 2014-01-16 ENCOUNTER — Telehealth: Payer: Self-pay | Admitting: *Deleted

## 2014-01-16 MED ORDER — GABAPENTIN 300 MG PO CAPS: 300.0000 mg | ORAL_CAPSULE | Freq: Three times a day (TID) | ORAL | Status: DC

## 2014-01-16 NOTE — Telephone Encounter (Addendum)
Calling to get a refill on my medicine.  I called to see what medication he was requesting.  He said Gabapentin 300mg , I take one three times a day.  I told him I'd call him back once Dr. Blenda Mounts responds.  I called and informed the patient that Dr. Noralyn Pick the refill.  We sent it to [facility] on [address].

## 2014-01-17 ENCOUNTER — Telehealth: Payer: Self-pay | Admitting: *Deleted

## 2014-01-17 MED ORDER — GABAPENTIN 300 MG PO CAPS: 300.0000 mg | ORAL_CAPSULE | Freq: Three times a day (TID) | ORAL | Status: DC

## 2014-01-17 NOTE — Telephone Encounter (Signed)
I talked to you yesterday.  You said you called my prescriptionin.  I called [facility] [place] and they said they haven't gotten it.  Give me a call.  I returned his call and informed him we sent it to [facility] on [address].  We will get it switched over.

## 2014-01-23 ENCOUNTER — Encounter: Payer: Self-pay | Admitting: Oncology

## 2014-01-23 ENCOUNTER — Ambulatory Visit (INDEPENDENT_AMBULATORY_CARE_PROVIDER_SITE_OTHER): Payer: Medicare Other | Admitting: Internal Medicine

## 2014-01-23 ENCOUNTER — Encounter: Payer: Self-pay | Admitting: Internal Medicine

## 2014-01-23 VITALS — BP 133/88 | HR 93 | Temp 97.9°F | Ht 70.0 in | Wt 164.1 lb

## 2014-01-23 DIAGNOSIS — D509 Iron deficiency anemia, unspecified: Secondary | ICD-10-CM | POA: Diagnosis not present

## 2014-01-23 DIAGNOSIS — N529 Male erectile dysfunction, unspecified: Secondary | ICD-10-CM

## 2014-01-23 DIAGNOSIS — K2980 Duodenitis without bleeding: Secondary | ICD-10-CM | POA: Diagnosis not present

## 2014-01-23 DIAGNOSIS — Z87898 Personal history of other specified conditions: Secondary | ICD-10-CM

## 2014-01-23 DIAGNOSIS — F1010 Alcohol abuse, uncomplicated: Secondary | ICD-10-CM

## 2014-01-23 DIAGNOSIS — D649 Anemia, unspecified: Secondary | ICD-10-CM | POA: Diagnosis not present

## 2014-01-23 DIAGNOSIS — Z87891 Personal history of nicotine dependence: Secondary | ICD-10-CM

## 2014-01-23 LAB — CBC WITH DIFFERENTIAL/PLATELET
Basophils Absolute: 0 10*3/uL (ref 0.0–0.1)
Basophils Relative: 1 % (ref 0–1)
EOS PCT: 2 % (ref 0–5)
Eosinophils Absolute: 0.1 10*3/uL (ref 0.0–0.7)
HEMATOCRIT: 36.6 % — AB (ref 39.0–52.0)
Hemoglobin: 11.2 g/dL — ABNORMAL LOW (ref 13.0–17.0)
LYMPHS ABS: 1.4 10*3/uL (ref 0.7–4.0)
LYMPHS PCT: 33 % (ref 12–46)
MCH: 24.6 pg — ABNORMAL LOW (ref 26.0–34.0)
MCHC: 30.6 g/dL (ref 30.0–36.0)
MCV: 80.3 fL (ref 78.0–100.0)
MONOS PCT: 12 % (ref 3–12)
Monocytes Absolute: 0.5 10*3/uL (ref 0.1–1.0)
Neutro Abs: 2.2 10*3/uL (ref 1.7–7.7)
Neutrophils Relative %: 52 % (ref 43–77)
Platelets: 321 10*3/uL (ref 150–400)
RBC: 4.56 MIL/uL (ref 4.22–5.81)
WBC: 4.3 10*3/uL (ref 4.0–10.5)

## 2014-01-23 MED ORDER — PANTOPRAZOLE SODIUM 40 MG PO TBEC: 40.0000 mg | DELAYED_RELEASE_TABLET | Freq: Every day | ORAL | Status: DC

## 2014-01-23 NOTE — Assessment & Plan Note (Addendum)
Quit drinking 2 weeks ago  Going to [employer]

## 2014-01-23 NOTE — Assessment & Plan Note (Addendum)
Will refer to urology for outpatient follow up with h/o hematuria as well (2009) recent UA negative 01/2014  He will f/u 01/29/14 with urology CT 01/2010 with perirectal lymph node reviewed

## 2014-01-23 NOTE — Assessment & Plan Note (Addendum)
Given info on smoking cessation  Quit smoking 6 years ago

## 2014-01-23 NOTE — Progress Notes (Signed)
   Subjective:    Patient ID: William Jones, male    DOB: 01/09/1951, 63 y.o.   MRN: [id number]  HPI Comments: 63 y.o Past Medical History History of frostbite,  Elevated PSA, Fe def anemia, etoh abuse, hyperplastic polyp, peptic duodenitis, THC +, allergies   He presents for HFU:  1)  He was hosp. 4/7-4/10 for sx'matic anemia with Hbg 3.3 on 01/08/14. Hepatitis/HIV negative.  He was given 3 units pRBCs.  Colonscopy was performed and upper endoscopy with benign hyperplastic polyp (Dr. Benson Norway), mild peptic duodenitis.  He needs repeat CBC today.  He is trying to eat to eat right with reduced salt.  2) He reports problems with erectile dysfunction.   He has problems sustaining erection and asks about Viagra  PCP was MD at [facility]. Health serv closed so pt does not have PCP          SH: 2 kids (girl and boy)                            Review of Systems  Constitutional: Negative for fever, chills and fatigue.  Respiratory: Negative for shortness of breath.   Cardiovascular: Negative for chest pain.  Gastrointestinal: Negative for abdominal pain.       Dark stool  Genitourinary: Positive for frequency. Negative for dysuria, hematuria and difficulty urinating.       +erectile dysfunction  Neurological: Negative for dizziness, weakness and light-headedness.       Objective:   Physical Exam  Nursing note and vitals reviewed. Constitutional: He is oriented to person, place, and time. Vital signs are normal. He appears well-developed and well-nourished. He is cooperative.  HENT:  Head: Normocephalic and atraumatic.  Mouth/Throat: Oropharynx is clear and moist and mucous membranes are normal. No oropharyngeal exudate.  Eyes: Conjunctivae are normal. Pupils are equal, round, and reactive to light. Right eye exhibits no discharge. Left eye exhibits no discharge. No scleral icterus.  Cardiovascular: Normal rate, regular rhythm, S1 normal, S2 normal and normal heart sounds.   No murmur  heard. No lower ext edema   Pulmonary/Chest: Effort normal and breath sounds normal.  Abdominal: Soft. Bowel sounds are normal. He exhibits no distension. There is no tenderness.  Musculoskeletal: He exhibits no edema.  Neurological: He is alert and oriented to person, place, and time. Gait normal.  Skin: Skin is warm, dry and intact. No rash noted.  Psychiatric: He has a normal mood and affect. His speech is normal and behavior is normal. Judgment and thought content normal. Cognition and memory are normal.          Assessment & Plan:  F/u urology F/u with our clinic in 3-4 months sooner if needed

## 2014-01-23 NOTE — Patient Instructions (Addendum)
General Instructions: Avoid Advil/Ibuprofen Try to stop drinking alcohol  Start taking this medication Protonix    Treatment Goals: Blood pressure goal less than 140/90  Goals (1 Years of Data) as of 01/23/14   None      Progress Toward Treatment Goals:  No flowsheet data found.  Self Care Goals & Plans:  No flowsheet data found.  No flowsheet data found.   Care Management & Community Referrals:  Referral 01/23/2014  Referrals made to community resources none         Duodenitis Duodenitis is inflammation of the lining of the first part of your small intestine (duodenum). There are two types of duodenitis:  Acute duodenitis (develops suddenly and is short lived).   Chronic duodenitis (develops over an extended period and lasts months to years). CAUSES  Duodenitis is most often caused by infection with the bacterium Helicobacter pylori (H pylori). H pylori increases the production of stomach acid and causes changes in the environment of the duodenum. This irritates and damages the cells of the duodenum causing inflammation. Other causes of duodenitis include:   Long-term use of nonsteroidal anti-inflammatory drugs (NSAIDs). NSAIDs change the lining of the duodenum and make it more prone to injury from stomach acid.  Excessive use of alcohol. Alcohol increases stomach acid and changes the lining of the duodenum which makes it more likely for inflammation to develop.  Giardiasis. Giardiasis is a common infection of the small intestine. It can cause inflammation of the duodenum.   Other gastrointestinal disorders, such as Crohn disease. People with these disorders are more likely to develop duodenitis. SYMPTOMS  Although duodenitis does not always cause symptoms, symptoms that do occur include:  Nausea or vomiting.  Gassy, bloated feeling or an uncomfortable feeling of fullness after eating.  Burning, cramps, or pain in the upper abdominal area. DIAGNOSIS  To  diagnose duodenitis, your caregiver may use results from:   An exam of the duodenum using a thin tube with a tiny camera on the tip, which is placed down your throat (endoscope). The endoscope is passed through your stomach and into your duodenum. Sometimes a sample of tissue from your duodenum is removed with the endoscope. The sample is then examined under a microscope (biopsy) for signs of inflammation and H pylori infection.   Tests that check samples of your blood or stool for H pylori infection.   A test that checks the gases in a sample of your expired breath for H pylori infection. The test measures the levels of carbon dioxide in your breath after you drink a special solution.  An X-ray exam using a special liquid that you swallow to illuminate your digestive tract (barium) to show signs of inflammation. TREATMENT  Treatment will depend on the cause of the duodenitis. The most common treatments include:  Use of medication to treat infection.  Medication to reduce stomach acid.  Discontinuing the use of NSAIDs.  Management of other gastrointestinal conditions.  Avoiding alcohol consumption. Additionally, taking the following steps can help to reduce the severity of your symptoms:  Drink enough water to keep your urine clear or pale yellow.  Avoid consuming these foods or drinks:  Caffeinated drinks.  Chocolate.  Peppermint or mint-flavored food or drinks.  Garlic.  Onions.  Spicy foods.  Citrus fruits, such as oranges, lemons, or limes.  Foods that use tomato-based sauces, such as pasta sauce, chili, salsa, and pizza.  Fatty foods.  Fried foods. Document Released: 01/15/2013 Document Reviewed: 01/15/2013 [employer] Patient  Information 2014 [place], [place]. rostate-Specific Antigen The prostate-specific antigen (PSA) is a blood test. It is used to help detect early forms of prostate cancer. The test is usually used along with other tests. The test is also used  to follow the course of those who already have prostate cancer or who have been treated for prostate cancer. Some factors interfere with the results of the PSA. The factors listed below will either increase or decrease the PSA levels. They are:  Prescriptions used for male baldness.  Some herbs.  Active prostate infection.  Prior instrumentation or urinary catheterization.  Ejaculation up to 2 days prior to testing.  A noncancerous enlargement of the prostate.  Inflammation of the prostate.  Active urinary tract infection. If your test results are elevated, your caregiver will discuss the results with you. Your caregiver will also let you know if more evaluation is needed. PREPARATION FOR TEST No preparation or fasting is necessary. NORMAL FINDINGS Less than 4 ng/mL or Less than 99mcg/L (SI units) Ranges for normal findings may vary among different laboratories and hospitals. You should always check with your caregiver after having lab work or other tests done to discuss the meaning of your test results and whether your values are considered within normal limits. MEANING OF TEST  A normal value means prostate cancer is less likely. The chance of having prostate cancer increases if the value is between 4 ng/mL and 10 ng/mL. However, further testing will be needed. Values above 10 ng/mL indicate that there is a much higher chance of having prostate cancer (if the above situations that raise PSA are not present). Your caregiver will go over your test results with you and discuss the importance of this test. If this value is elevated, your caregiver may recommend further testing or evaluation. OBTAINING THE TEST RESULTS It is your responsibility to obtain your test results. Ask the lab or department performing the test when and how you will get your results. Document Released: 10/23/2004 Document Revised: 12/13/2011 Document Reviewed: 04/28/2007 [facility] Patient Information 2014 [place].  Iron Deficiency Anemia, Adult Anemia is a condition in which there are less red blood cells or hemoglobin in the blood than normal. Hemoglobin is this part of red blood cells that carries oxygen. Iron deficiency anemia is anemia caused by too little iron. It is the most common type of anemia. It may leave you tired and short of breath. CAUSES   Lack of iron in the diet.  Poor absorption of iron, as seen with intestinal disorders.  Intestinal bleeding.  Heavy periods. SIGNS AND SYMPTOMS  Mild anemia may not be noticeable. Symptoms may include:  Fatigue.  Headache.  Pale skin.  Weakness.  Tiredness.  Shortness of breath.  Dizziness.  Cold hands and feet.  Fast or irregular heartbeat. DIAGNOSIS  Diagnosis requires a thorough evaluation and physical exam by your health care provider. Blood tests are generally used to confirm iron deficiency anemia. Additional tests may be done to find the underlying cause of your anemia. These may include:  Testing for blood in the stool (fecal occult blood test).  A procedure to see inside the colon and rectum (colonoscopy).  A procedure to see inside the esophagus and stomach (endoscopy). TREATMENT  Iron deficiency anemia is treated by correcting the cause of the deficiency. Treatment may involve:  Adding iron-rich foods to your diet.  Taking iron supplements. Pregnant or breastfeeding women need to take extra iron, because their normal diet usually does not provide  the required amount.  Taking vitamins. Vitamin C improves the absorption of iron. Your health care provider may recommend taking your iron tablets with a glass of orange juice or vitamin C supplement.  Medicines to make heavy menstrual flow lighter.  Surgery. HOME CARE INSTRUCTIONS   Take iron as directed by your health care provider.  If you cannot tolerate taking iron supplements by mouth, talk to your health care provider about taking them through a vein  (intravenously) or an injection into a muscle.  For the best iron absorption, iron supplements should be taken on an empty stomach. If you cannot tolerate them on an empty stomach, you may need to take them with food.  Do not drink milk or take antacids at the same time as your iron supplements. Milk and antacids may interfere with the absorption of iron.  Iron supplements can cause constipation. Make sure to include fiber in your diet to prevent constipation. A stool softener may also be recommended.  Take vitamins as directed by your health care provider.  Eat a diet rich in iron. Foods high in iron include liver, lean beef, whole-grain bread, eggs, dried fruit, and dark green, leafy vegetables. SEEK IMMEDIATE MEDICAL CARE IF:   You faint. If this happens, do not drive. Call your local emergency services (911 in [place] if no other help is available.  You have chest pain.  You feel nauseous or vomit.  You have severe or increased shortness of breath with activity.  You feel weak.  You have a rapid heartbeat.  You have unexplained sweating.  You become lightheaded when getting up from a chair or bed. MAKE SURE YOU:   Understand these instructions.  Will watch your condition.  Will get help right away if you are not doing well or get worse. Document Released: 09/17/2000 Document Revised: 07/11/2013 Document Reviewed: 05/28/2013 [facility] Patient Information 2014 [place].  Pantoprazole tablets What is this medicine? PANTOPRAZOLE (pan TOE pra zole) prevents the production of acid in the stomach. It is used to treat gastroesophageal reflux disease (GERD), inflammation of the esophagus, and Zollinger-Ellison syndrome. This medicine may be used for other purposes; ask your health care provider or pharmacist if you have questions. COMMON BRAND NAME(S): Protonix What should I tell my health care provider before I take this medicine? They need to know if you have any of these  conditions: -liver disease -low levels of magnesium in the blood -an unusual or allergic reaction to omeprazole, lansoprazole, pantoprazole, rabeprazole, other medicines, foods, dyes, or preservatives -pregnant or trying to get pregnant -breast-feeding How should I use this medicine? Take this medicine by mouth. Swallow the tablets whole with a drink of water. Follow the directions on the prescription label. Do not crush, break, or chew. Take your medicine at regular intervals. Do not take your medicine more often than directed. Talk to your pediatrician regarding the use of this medicine in children. While this drug may be prescribed for children as young as 5 years for selected conditions, precautions do apply. Overdosage: If you think you have taken too much of this medicine contact a poison control center or emergency room at once. NOTE: This medicine is only for you. Do not share this medicine with others. What if I miss a dose? If you miss a dose, take it as soon as you can. If it is almost time for your next dose, take only that dose. Do not take double or extra doses. What may interact with this medicine?  Do not take this medicine with any of the following medications: -atazanavir -nelfinavir This medicine may also interact with the following medications: -ampicillin -delavirdine -digoxin -diuretics -iron salts -medicines for fungal infections like ketoconazole, itraconazole and voriconazole -warfarin This list may not describe all possible interactions. Give your health care provider a list of all the medicines, herbs, non-prescription drugs, or dietary supplements you use. Also tell them if you smoke, drink alcohol, or use illegal drugs. Some items may interact with your medicine. What should I watch for while using this medicine? It can take several days before your stomach pain gets better. Check with your doctor or health care professional if your condition does not start to  get better, or if it gets worse. You may need blood work done while you are taking this medicine. What side effects may I notice from receiving this medicine? Side effects that you should report to your doctor or health care professional as soon as possible: -allergic reactions like skin rash, itching or hives, swelling of the face, lips, or tongue -bone, muscle or joint pain -breathing problems -chest pain or chest tightness -dark yellow or brown urine -dizziness -fast, irregular heartbeat -feeling faint or lightheaded -fever or sore throat -muscle spasm -palpitations -redness, blistering, peeling or loosening of the skin, including inside the mouth -seizures -tremors -unusual bleeding or bruising -unusually weak or tired -yellowing of the eyes or skin Side effects that usually do not require medical attention (Report these to your doctor or health care professional if they continue or are bothersome.): -constipation -diarrhea -dry mouth -headache -nausea This list may not describe all possible side effects. Call your doctor for medical advice about side effects. You may report side effects to FDA at 1-800-FDA-1088. Where should I keep my medicine? Keep out of the reach of children. Store at room temperature between 15 and 30 degrees C (59 and 86 degrees F). Protect from light and moisture. Throw away any unused medicine after the expiration date. NOTE: This sheet is a summary. It may not cover all possible information. If you have questions about this medicine, talk to your doctor, pharmacist, or health care provider.  2014, [employer]. (2012-07-19 16:40:16) Smoking Cessation Quitting smoking is important to your health and has many advantages. However, it is not always easy to quit since nicotine is a very addictive drug. Often times, people try 3 times or more before being able to quit. This document explains the best ways for you to prepare to quit smoking.  Quitting takes hard work and a lot of effort, but you can do it. ADVANTAGES OF QUITTING SMOKING  You will live longer, feel better, and live better.  Your body will feel the impact of quitting smoking almost immediately.  Within 20 minutes, blood pressure decreases. Your pulse returns to its normal level.  After 8 hours, carbon monoxide levels in the blood return to normal. Your oxygen level increases.  After 24 hours, the chance of having a heart attack starts to decrease. Your breath, hair, and body stop smelling like smoke.  After 48 hours, damaged nerve endings begin to recover. Your sense of taste and smell improve.  After 72 hours, the body is virtually free of nicotine. Your bronchial tubes relax and breathing becomes easier.  After 2 to 12 weeks, lungs can hold more air. Exercise becomes easier and circulation improves.  The risk of having a heart attack, stroke, cancer, or lung disease is greatly reduced.  After 1 year, the risk of  coronary heart disease is cut in half.  After 5 years, the risk of stroke falls to the same as a nonsmoker.  After 10 years, the risk of lung cancer is cut in half and the risk of other cancers decreases significantly.  After 15 years, the risk of coronary heart disease drops, usually to the level of a nonsmoker.  If you are pregnant, quitting smoking will improve your chances of having a healthy baby.  The people you live with, especially any children, will be healthier.  You will have extra money to spend on things other than cigarettes. QUESTIONS TO THINK ABOUT BEFORE ATTEMPTING TO QUIT You may want to talk about your answers with your caregiver.  Why do you want to quit?  If you tried to quit in the past, what helped and what did not?  What will be the most difficult situations for you after you quit? How will you plan to handle them?  Who can help you through the tough times? Your family? Friends? A caregiver?  What pleasures do  you get from smoking? What ways can you still get pleasure if you quit? Here are some questions to ask your caregiver:  How can you help me to be successful at quitting?  What medicine do you think would be best for me and how should I take it?  What should I do if I need more help?  What is smoking withdrawal like? How can I get information on withdrawal? GET READY  Set a quit date.  Change your environment by getting rid of all cigarettes, ashtrays, matches, and lighters in your home, car, or work. Do not let people smoke in your home.  Review your past attempts to quit. Think about what worked and what did not. GET SUPPORT AND ENCOURAGEMENT You have a better chance of being successful if you have help. You can get support in many ways.  Tell your family, friends, and co-workers that you are going to quit and need their support. Ask them not to smoke around you.  Get individual, group, or telephone counseling and support. Programs are available at [employer] and health centers. Call your local health department for information about programs in your area.  Spiritual beliefs and practices may help some smokers quit.  Download a "quit meter" on your computer to keep track of quit statistics, such as how long you have gone without smoking, cigarettes not smoked, and money saved.  Get a self-help book about quitting smoking and staying off of tobacco. [place] yourself from urges to smoke. Talk to someone, go for a walk, or occupy your time with a task.  Change your normal routine. Take a different route to work. Drink tea instead of coffee. Eat breakfast in a different place.  Reduce your stress. Take a hot bath, exercise, or read a book.  Plan something enjoyable to do every day. Reward yourself for not smoking.  Explore interactive web-based programs that specialize in helping you quit. GET MEDICINE AND USE IT CORRECTLY Medicines can help  you stop smoking and decrease the urge to smoke. Combining medicine with the above behavioral methods and support can greatly increase your chances of successfully quitting smoking.  Nicotine replacement therapy helps deliver nicotine to your body without the negative effects and risks of smoking. Nicotine replacement therapy includes nicotine gum, lozenges, inhalers, nasal sprays, and skin patches. Some may be available over-the-counter and others require a prescription.  Antidepressant medicine  helps people abstain from smoking, but how this works is unknown. This medicine is available by prescription.  Nicotinic receptor partial agonist medicine simulates the effect of nicotine in your brain. This medicine is available by prescription. Ask your caregiver for advice about which medicines to use and how to use them based on your health history. Your caregiver will tell you what side effects to look out for if you choose to be on a medicine or therapy. Carefully read the information on the package. Do not use any other product containing nicotine while using a nicotine replacement product.  RELAPSE OR DIFFICULT SITUATIONS Most relapses occur within the first 3 months after quitting. Do not be discouraged if you start smoking again. Remember, most people try several times before finally quitting. You may have symptoms of withdrawal because your body is used to nicotine. You may crave cigarettes, be irritable, feel very hungry, cough often, get headaches, or have difficulty concentrating. The withdrawal symptoms are only temporary. They are strongest when you first quit, but they will go away within 10 14 days. To reduce the chances of relapse, try to:  Avoid drinking alcohol. Drinking lowers your chances of successfully quitting.  Reduce the amount of caffeine you consume. Once you quit smoking, the amount of caffeine in your body increases and can give you symptoms, such as a rapid heartbeat, sweating,  and anxiety.  Avoid smokers because they can make you want to smoke.  Do not let weight gain distract you. Many smokers will gain weight when they quit, usually less than 10 pounds. Eat a healthy diet and stay active. You can always lose the weight gained after you quit.  Find ways to improve your mood other than smoking. FOR MORE INFORMATION  www.smokefree.gov  Document Released: 09/14/2001 Document Revised: 03/21/2012 Document Reviewed: 12/30/2011 [facility] Patient Information 2014 [place], [place].  Chemical Dependency Chemical dependency is an addiction to drugs or alcohol. It is characterized by the repeated behavior of seeking out and using drugs and alcohol despite harmful consequences to the health and safety of ones self and others.  RISK FACTORS There are certain situations or behaviors that increase a person's risk for chemical dependency. These include:  A family history of chemical dependency.  A history of mental health issues, including depression and anxiety.  A home environment where drugs and alcohol are easily available to you.  Drug or alcohol use at a young age. SYMPTOMS  The following symptoms can indicate chemical dependency:  Inability to limit the use of drugs or alcohol.  Nausea, sweating, shakiness, and anxiety that occurs when alcohol or drugs are not being used.  An increase in amount of drugs or alcohol that is necessary to get drunk or high. People who experience these symptoms can assess their use of drugs and alcohol by asking themselves the following questions:  Have you been told by friends or family that they are worried about your use of alcohol or drugs?  Do friends and family ever tell you about things you did while drinking alcohol or using drugs that you do not remember?  Do you lie about using alcohol or drugs or about the amounts you use?  Do you have difficulty completing daily tasks unless you use alcohol or drugs?  Is the level of  your work or school performance lower because of your drug or alcohol use?  Do you get sick from using drugs or alcohol but keep using anyway?  Do you feel uncomfortable in social  situations unless you use alcohol or drugs?  Do you use drugs or alcohol to help forget problems? An answer of yes to any of these questions may indicate chemical dependency. Professional evaluation is suggested. Document Released: 09/14/2001 Document Revised: 12/13/2011 Document Reviewed: 11/26/2010 [facility] Patient Information 2014 [place], [place].  Prostate Cancer  Prostate cancer is the abnormal growth of cells in your prostate gland. Your prostate gland is involved in the production of semen. It is located below your bladder and in front of your rectum. A normal prostate gland is the size of a walnut and surrounds the tube that carries urine from the bladder (urethra).  RISK FACTORS  Age older than 29 years.  African-American race.  Obesity.  Family history of prostate cancer.  Family history of breast cancer. SIGNS AND SYMPTOMS   Frequent urination.  Weak or interrupted flow of urine.  Difficulty starting or stopping urination.  Inability to urinate.  Painful or burning urination.  Painful ejaculation.  Blood in urine or semen.  Persistent pain or discomfort in the lower back, lower abdomen, hips, or upper thighs.  Difficulty getting an erection.  Difficulty emptying your bladder completely. DIAGNOSIS  Prostate cancer can be diagnosed by a digital rectal exam, prostate-specific antigen (PSA) blood test, transrectal ultrasonography, and then a biopsy to test a tissue sample. Usually 8 12 samples are taken. The tissue is sent to a specialist who looks at tissues and cells (pathologist). If cancer is diagnosed, the next step is to stage the cancer. This means it is put in a category based on how far the cancer has spread. This is important for helping your health care providers plan  appropriate treatment. The following are the different stages of prostate cancer:  Stage I Cancer is found in the prostate only. It cannot be felt during a digital rectal exam and is not visible by imaging. It is usually found accidentally, such as during surgery for other prostate problems.  Stage II Cancer is more advanced than in stage I but has not spread outside the prostate.  Stage III Cancer has spread beyond the outer layer of the prostate to nearby tissues. Cancer may be found in the seminal vesicles.  Stage IV Cancer has spread to lymph nodes or to other parts of the body. The cancer may have spread to the bladder, rectum, bones, liver, or lungs. Prostate cancer often spreads to the bones. Imaging scans, such as a bone scan, CT, PET, or MRI, are done to help in the staging process. TREATMENT  Treatments such as surgery, medicines, and radiation may be recommended based on the stage of the cancer and other factors. Once cancer of the prostate has been diagnosed, your health care provider will discuss your treatment with you. Your health care provider will help you decide on the best course of treatment. Treatment often depends on your age, health, and other risk factors. The more common methods of treatment are:  Observation for early stage prostate cancer.  Open surgery This involves a surgery to remove the prostate.  Laparoscopic prostatectomy to remove the prostate and lymph nodes.  Robotic prostatectomy to remove the prostate and lymph nodes.  External beam radiation, which aims beams of radiation from outside the body at the prostate.  Internal radiation (brachytherapy), which uses radioactive needles, 40 100 pellets (seeds), wires, or catheters implanted directly into the prostate gland.  High-intensity, focused ultrasonography to destroy cancer cells.  Cryosurgery to freeze and destroy prostate cancer cells.  Chemotherapy medicines to  stop the growth of cancer cells  either by killing them or by stopping them from multiplying.  Hormone treatment Medicines that stop your body from producing testosterone, or medicines that block testosterone from reaching cancer cells.  Orchiectomy This is surgery to remove your testicles. HOME CARE INSTRUCTIONS  Only take over-the-counter or prescription medicines for pain, discomfort, or fever as directed by your health care provider.  Maintain a healthy diet.  Get plenty of sleep.  Consider joining a support group. This may help you learn to cope with the stress of having prostate cancer.  Seek advice to help you manage treatment side effects.  Keep all follow-up appointments as directed by your health care provider.  Inform your cancer specialist if you are admitted to the hospital.  Continue sexual expression If you experience erectile dysfunction, your natural reaction will be to pull away and avoid all sexual contact. Consider touching, holding, hugging, and caressing as ways to continue sharing sexuality with your partner. SEEK MEDICAL CARE IF:  You have trouble urinating.  You have blood in your urine.  You have trouble getting an erection.  You have pain in your hips, back, or chest. SEEK IMMEDIATE MEDICAL CARE IF:  You have weakness or numbness in your legs.  You have involuntary loss of urine or stool (incontinence). Document Released: 09/20/2005 Document Revised: 07/11/2013 Document Reviewed: 03/09/2013 [facility] Patient Information 2014 [place]. Erectile Dysfunction Erectile dysfunction is the inability to get or sustain a good enough erection to have sexual intercourse. Erectile dysfunction may involve:  Inability to get an erection.  Lack of enough hardness to allow penetration.  Loss of the erection before sex is finished.  Premature ejaculation. CAUSES  Certain drugs, such as:  Pain relievers.  Antihistamines.  Antidepressants.  Blood pressure medicines.  Water  pills (diuretics).  Ulcer medicines.  Muscle relaxants.  Illegal drugs.  Excessive drinking.  Psychological causes, such as:  Anxiety.  Depression.  Sadness.  Exhaustion.  Performance fear.  Stress.  Physical causes, such as:  Artery problems. This may include diabetes, smoking, liver disease, or atherosclerosis.  High blood pressure.  Hormonal problems, such as low testosterone.  Obesity.  Nerve problems. This may include back or pelvic injuries, diabetes mellitus, multiple sclerosis, or Parkinson disease. SYMPTOMS  Inability to get an erection.  Lack of enough hardness to allow penetration.  Loss of the erection before sex is finished.  Premature ejaculation.  Normal erections at some times, but with frequent unsatisfactory episodes.  Orgasms that are not satisfactory in sensation or frequency.  Low sexual satisfaction in either partner because of erection problems.  A curved penis occurring with erection. The curve may cause pain or may be too curved to allow for intercourse.  Never having nighttime erections. DIAGNOSIS Your caregiver can often diagnose this condition by:  Performing a physical exam to find other diseases or specific problems with the penis.  Asking you detailed questions about the problem.  Performing blood tests to check for diabetes mellitus or to measure hormone levels.  Performing urine tests to find other underlying health conditions.  Performing an ultrasound exam to check for scarring.  Performing a test to check blood flow to the penis.  Doing a sleep study at home to measure nighttime erections. TREATMENT   You may be prescribed medicines by mouth.  You may be given medicine injections into the penis.  You may be prescribed a vacuum pump with a ring.  Penile implant surgery may be performed. You  may receive:  An inflatable implant.  A semirigid implant.  Blood vessel surgery may be performed. HOME CARE  INSTRUCTIONS  If you are prescribed oral medicine, you should take the medicine as prescribed. Do not increase the dosage without first discussing it with your physician.  If you are using self-injections, be careful to avoid any veins that are on the surface of the penis. Apply pressure to the injection site for 5 minutes.  If you are using a vacuum pump, make sure you have read the instructions before using it. Discuss any questions with your physician before taking the pump home. SEEK MEDICAL CARE IF:  You experience pain that is not responsive to the pain medicine you have been prescribed.  You experience nausea or vomiting. SEEK IMMEDIATE MEDICAL CARE IF:   When taking oral or injectable medications, you experience an erection that lasts longer than 4 hours. If your physician is unavailable, go to the nearest emergency room for evaluation. An erection that lasts much longer than 4 hours can result in permanent damage to your penis.  You have pain that is severe.  You develop redness, severe pain, or severe swelling of your penis.  You have redness spreading up into your groin or lower abdomen.  You are unable to pass your urine. Document Released: 09/17/2000 Document Revised: 05/23/2013 Document Reviewed: 02/22/2013 [facility] Patient Information 2014 [place].

## 2014-01-23 NOTE — Assessment & Plan Note (Signed)
Will try Protonix 40 mg #30 RF x 1  Avoid Alcohol and NSAIDs

## 2014-01-23 NOTE — Assessment & Plan Note (Signed)
Colonoscopy with benign hyperplastic polpy and peptic duodenitis (Dr. Benson Norway), pt also had elevated PSA Will check CBC today  Encouraged compliance with Fe 325 tid

## 2014-01-23 NOTE — Progress Notes (Signed)
Attending physician note: I reviewed the presenting complaints, problem list, physical findings, and medications with resident physician Karlyn Agee and I concur with her management. I initially dictated this note on April 16. It came back to my in box as an incomplete record today April 22 for reasons that I don't understand. [name], MD, [place]  Hematology-Oncology/Internal Medicine

## 2014-01-23 NOTE — Assessment & Plan Note (Signed)
Since going to urology will defer to urology

## 2014-01-24 ENCOUNTER — Encounter: Payer: Self-pay | Admitting: Internal Medicine

## 2014-01-24 NOTE — Progress Notes (Signed)
Patient ID: William Jones, male   DOB: [birth year], 63 y.o.   MRN: [id number] Correction to attending note dated 01/23/2014. I made an incorrect statement that I had already reviewed this patient's data end of the dictation a week ago. April 22 was the first time that he was formally presented to me. I have been formal discussions about this patient when he was recently hospitalized. [name], MD, [place]  Hematology-Oncology/Internal Medicine

## 2014-01-29 DIAGNOSIS — R972 Elevated prostate specific antigen [PSA]: Secondary | ICD-10-CM | POA: Diagnosis not present

## 2014-01-29 DIAGNOSIS — N402 Nodular prostate without lower urinary tract symptoms: Secondary | ICD-10-CM | POA: Diagnosis not present

## 2014-02-05 ENCOUNTER — Ambulatory Visit (INDEPENDENT_AMBULATORY_CARE_PROVIDER_SITE_OTHER): Payer: Medicare Other

## 2014-02-05 VITALS — BP 117/66 | HR 111 | Resp 16

## 2014-02-05 DIAGNOSIS — M79609 Pain in unspecified limb: Secondary | ICD-10-CM | POA: Diagnosis not present

## 2014-02-05 DIAGNOSIS — G609 Hereditary and idiopathic neuropathy, unspecified: Secondary | ICD-10-CM | POA: Diagnosis not present

## 2014-02-05 DIAGNOSIS — B351 Tinea unguium: Secondary | ICD-10-CM

## 2014-02-05 DIAGNOSIS — G629 Polyneuropathy, unspecified: Secondary | ICD-10-CM

## 2014-02-05 DIAGNOSIS — IMO0002 Reserved for concepts with insufficient information to code with codable children: Secondary | ICD-10-CM

## 2014-02-05 DIAGNOSIS — Q828 Other specified congenital malformations of skin: Secondary | ICD-10-CM

## 2014-02-05 DIAGNOSIS — M792 Neuralgia and neuritis, unspecified: Secondary | ICD-10-CM

## 2014-02-05 NOTE — Patient Instructions (Signed)

## 2014-02-05 NOTE — Progress Notes (Signed)
   Subjective:    Patient ID: William Jones, male    DOB: 17-Feb-1951, 63 y.o.   MRN: [id number]  HPI Comments: "Trim my toenails and calluses"  Calluses:  1st (medial) and 2nd (tip) toes left     Review of Systems no new systemic changes or findings noted     Objective:   Physical Exam Lower extremity objective findings as follows after status reveals pedal pulses palpable DP +2/4 bilateral PT one over 4 thready bilateral capillary refill time 3 seconds all digits skin temperature warm to cool turgor diminished there is mild edema noted no rubor pallor or varicosities noted. Neurologic epicritic and proprioceptive sensations intact although diminished on Semmes Weinstein testing to forefoot digits patient has paresthesias a history of neuropathy possibly associated with cold exposure or frostbite in its past. At this time nails thick crumbly friable discolored 1 through 5 bilateral painful and tender both on palpation and debridement. Patient is distal clavus second left and pinch callus of the hallux bilateral multiple keratotic lesions are noted at this time and debrided as well this time rigid digital contractures hammertoe deformities noted no open wounds ulcerations no secondary infections.       Assessment & Plan:  Assessment this time is history peripheral neuropathy with some vascular compromise history of cold exposure at this time thick painful mycotic nails 1 through 5 bilateral are debrided also multiple keratoses pinch callus both hallux and distal clavus second left are debrided and the presence of his pain in symptomology and peripheral neuropathy findings return for future palliative care and as-needed basis next  Harriet Masson DPM

## 2014-03-05 DIAGNOSIS — C61 Malignant neoplasm of prostate: Secondary | ICD-10-CM | POA: Diagnosis not present

## 2014-03-05 DIAGNOSIS — R972 Elevated prostate specific antigen [PSA]: Secondary | ICD-10-CM | POA: Diagnosis not present

## 2014-03-05 HISTORY — DX: Malignant neoplasm of prostate: C61

## 2014-03-05 HISTORY — PX: PROSTATE BIOPSY: SHX241

## 2014-03-13 DIAGNOSIS — C61 Malignant neoplasm of prostate: Secondary | ICD-10-CM | POA: Diagnosis not present

## 2014-03-19 ENCOUNTER — Other Ambulatory Visit (HOSPITAL_COMMUNITY): Payer: Self-pay | Admitting: Urology

## 2014-03-19 DIAGNOSIS — C61 Malignant neoplasm of prostate: Secondary | ICD-10-CM

## 2014-03-20 ENCOUNTER — Encounter: Payer: Self-pay | Admitting: Internal Medicine

## 2014-03-20 DIAGNOSIS — Z8546 Personal history of malignant neoplasm of prostate: Secondary | ICD-10-CM | POA: Insufficient documentation

## 2014-03-20 HISTORY — DX: Personal history of malignant neoplasm of prostate: Z85.46

## 2014-03-26 ENCOUNTER — Other Ambulatory Visit: Payer: Self-pay | Admitting: *Deleted

## 2014-03-26 DIAGNOSIS — K2980 Duodenitis without bleeding: Secondary | ICD-10-CM

## 2014-03-26 MED ORDER — PANTOPRAZOLE SODIUM 40 MG PO TBEC: 40.0000 mg | DELAYED_RELEASE_TABLET | Freq: Every day | ORAL | Status: DC

## 2014-04-10 ENCOUNTER — Encounter (HOSPITAL_COMMUNITY): Payer: Medicare Other

## 2014-04-10 ENCOUNTER — Ambulatory Visit (HOSPITAL_COMMUNITY): Payer: Medicare Other

## 2014-04-25 ENCOUNTER — Encounter (HOSPITAL_COMMUNITY)
Admission: RE | Admit: 2014-04-25 | Discharge: 2014-04-25 | Disposition: A | Discharge status: Home / Self Care | Payer: Medicare Other | Source: Ambulatory Visit | Attending: Urology | Admitting: Urology

## 2014-04-25 DIAGNOSIS — C61 Malignant neoplasm of prostate: Secondary | ICD-10-CM

## 2014-04-25 MED ORDER — TECHNETIUM TC 99M MEDRONATE IV KIT
26.0000 | PACK | Freq: Once | INTRAVENOUS | Status: AC | PRN
  Administered 2014-04-25: 26 via INTRAVENOUS

## 2014-05-07 ENCOUNTER — Ambulatory Visit: Payer: Medicare Other

## 2014-05-14 ENCOUNTER — Encounter: Payer: Self-pay | Admitting: Radiation Oncology

## 2014-05-14 ENCOUNTER — Other Ambulatory Visit: Payer: Self-pay

## 2014-05-20 ENCOUNTER — Encounter (HOSPITAL_COMMUNITY): Payer: Medicare Other

## 2014-05-20 ENCOUNTER — Ambulatory Visit (HOSPITAL_COMMUNITY): Payer: Medicare Other

## 2014-05-20 ENCOUNTER — Telehealth: Payer: Self-pay | Admitting: *Deleted

## 2014-05-20 NOTE — Telephone Encounter (Signed)
Called patient to introduce myself as Prostate Oncology Navigator and coordinator of the Prostate [place], to confirm his referral for the clinic.  LVM with request to return my call.  Gayleen Orem, RN, BSN, [place] at [place] [phone]

## 2014-05-21 ENCOUNTER — Telehealth: Payer: Self-pay | Admitting: *Deleted

## 2014-05-21 NOTE — Telephone Encounter (Signed)
Again called patient to introduce myself as Prostate Oncology Navigator and coordinator of the Prostate [place], to confirm his referral for the clinic on 05/28/14, location of [place], arrival time of 12:30, registration procedure, and format of clinic.  I indicated I am mailing an Information Packet to him that contains medical information forms which he should complete and bring with him on the day of clinic.  He verbalized understanding.  I provided my phone number and encouraged him to call me if he has any questions after receiving the Information Packet or prior to my call the day before clinic.  He verbalized understanding.  Gayleen Orem, RN, BSN, [place] at [place] at [place] [phone]

## 2014-05-23 ENCOUNTER — Telehealth: Payer: Self-pay | Admitting: Oncology

## 2014-05-23 ENCOUNTER — Encounter: Payer: Self-pay | Admitting: Radiation Oncology

## 2014-05-23 NOTE — Telephone Encounter (Signed)
Chart is ready for the Prostate Clinic on 8/25.

## 2014-05-23 NOTE — Progress Notes (Signed)
GU Location of Tumor / Histology: prostate adenocarcinoma  If Prostate Cancer, Gleason Score is (4 + 3) and PSA is (42.92 on 02/05/14) 2012  PSA Doffing presented 4 months ago with signs/symptoms of: referred from [facility] for elevated PSA  Biopsies of prostate revealed:  03/05/14  Volume   45 cc    Past/Anticipated interventions by urology, if any: biopsy. Patient was also seen by urologist x 1 in 2011 for elevated PSA > 30. Pt failed to keep FU appt.  Past/Anticipated interventions by medical oncology, if any: none  Weight changes, if any: no  Bowel/Bladder complaints, if any:  IPSS 7, urinary frequency every 2 hours or less, nocturia x 2  Nausea/Vomiting, if any: no  Pain issues, if any:  no  SAFETY ISSUES:  Prior radiation? no  Pacemaker/ICD? no  Possible current pregnancy? na  Is the patient on methotrexate? No  Current Complaints / other details:  Widowed, 1 son, 1 daughter, unemployed No family history of prostate cancer known. Patient's self-completed 'physical symptoms within the past few weeks' indicates change in stool habits and change in stool color.

## 2014-05-27 ENCOUNTER — Telehealth: Payer: Self-pay | Admitting: *Deleted

## 2014-05-27 NOTE — Telephone Encounter (Signed)
Called patient to see if he had any questions prior to his attendance at tomorrow's Prostate [place].  He stated he did not.  He confirmed he had received the information packet but hadn't opened it yet.  I encouraged him to do so and to complete the medical information forms and bring them tomorrow.  I confirmed his understanding of a  12:30 arrival time and his understanding of the [facility] location.  Gayleen Orem, RN, BSN, [place] at [place] [phone]

## 2014-05-28 ENCOUNTER — Ambulatory Visit (HOSPITAL_BASED_OUTPATIENT_CLINIC_OR_DEPARTMENT_OTHER): Payer: Medicare Other | Admitting: Oncology

## 2014-05-28 ENCOUNTER — Encounter: Payer: Self-pay | Admitting: Radiation Oncology

## 2014-05-28 ENCOUNTER — Encounter: Payer: Self-pay | Admitting: Specialist

## 2014-05-28 ENCOUNTER — Ambulatory Visit
Admission: RE | Admit: 2014-05-28 | Discharge: 2014-05-28 | Disposition: A | Discharge status: Home / Self Care | Payer: Medicare Other | Source: Ambulatory Visit | Attending: Radiation Oncology | Admitting: Radiation Oncology

## 2014-05-28 ENCOUNTER — Encounter: Payer: Self-pay | Admitting: *Deleted

## 2014-05-28 VITALS — BP 178/107 | HR 90 | Temp 98.5°F | Resp 20 | Ht 70.0 in | Wt 145.1 lb

## 2014-05-28 DIAGNOSIS — D509 Iron deficiency anemia, unspecified: Secondary | ICD-10-CM

## 2014-05-28 DIAGNOSIS — C61 Malignant neoplasm of prostate: Secondary | ICD-10-CM

## 2014-05-28 DIAGNOSIS — R350 Frequency of micturition: Secondary | ICD-10-CM

## 2014-05-28 DIAGNOSIS — G609 Hereditary and idiopathic neuropathy, unspecified: Secondary | ICD-10-CM | POA: Diagnosis not present

## 2014-05-28 HISTORY — DX: Duodenitis without bleeding: K29.80

## 2014-05-28 HISTORY — DX: Polyneuropathy, unspecified: G62.9

## 2014-05-28 HISTORY — DX: Malignant neoplasm of prostate: C61

## 2014-05-28 HISTORY — DX: Thrombocytopenia, unspecified: D69.6

## 2014-05-28 HISTORY — DX: Other psychoactive substance abuse, in remission: F19.11

## 2014-05-28 HISTORY — DX: Iron deficiency anemia, unspecified: D50.9

## 2014-05-28 NOTE — Consult Note (Signed)
Reason for Referral: Prostate cancer.   HPI: 63 year old gentleman currently agrees well when he lived the majority of his life. He is a gentleman with a history of polysubstance abuse but ongoing alcohol that left him with possible neuropathy in recurrent hospitalizations for vitamin deficiency and iron deficiency anemia. He was found to have an elevated PSA of 30 back in 2011 and was evaluated by Dr. Risa Grill. At that time, he is at abnormal digital rectal exam and a recommended biopsy. Patient failed to followup at that time. He was referred again to Dr. Risa Grill in June of 2015 where his PSA was up to 42.92. His prostate volume was around 45 cc and underwent a biopsy in June of 2015. His biopsy showed Gleason score 4+3 equals 7 and all 12 cores. He has had a volume disease all throughout his prostate. He underwent imaging studies including bone scan on 04/25/2014 as well as a CT scan of the abdomen and pelvis. Neither showed any evidence of metastatic disease. Patient was referred to the prostate cancer multidisciplinary clinic for a discussion of treatment options.  Clinically, he is very little urinary symptoms at this time. He does report some occasional frequency but no incontinence or severe nocturia. He does not report any headaches or motor vision or syncope. Is not reporting any fevers chills or sweats. He does not report any chest pain or palpitation. Does not report any cough or hemoptysis. Does not report any nausea or vomiting or abdominal pain. He is drinking alcohol at this time after a period of abstinence. He does not report any skeletal complaints. Does not report any change in his bowel habits. Rest of his review of systems unremarkable.   Past Medical History  Diagnosis Date  . History of frostbite   . Prostate cancer 03/05/14    Gleason 7, volume 45 gm  . Iron deficiency anemia   . Peripheral neuropathy   . Duodenitis   . Thrombocytopenia   . H/O: substance abuse   :  Past  Surgical History  Procedure Laterality Date  . Colonoscopy N/A 01/10/2014    Procedure: COLONOSCOPY;  Surgeon: [name], MD;  Location: [place];  Service: Endoscopy;  Laterality: N/A;  . Esophagogastroduodenoscopy N/A 01/10/2014    Procedure: ESOPHAGOGASTRODUODENOSCOPY (EGD);  Surgeon: [name], MD;  Location[facility] ENDOSCOPY;  Service: Endoscopy;  Laterality: N/A;  . Givens capsule study N/A 01/10/2014    Procedure: GIVENS CAPSULE STUDY;  Surgeon: [name], MD;  Location: [facility];  Service: Endoscopy;  Laterality: N/A;  . Prostate biopsy  03/05/14    Gleason 7, vol 45 gm  :  Current Outpatient Prescriptions  Medication Sig Dispense Refill  . ferrous sulfate 325 (65 FE) MG tablet Take 1 tablet (325 mg total) by mouth 3 (three) times daily with meals.  90 tablet  5  . gabapentin (NEURONTIN) 300 MG capsule TAKE 1 CAPSULE (300 MG TOTAL) BY MOUTH THREE   TIMES DAILY.  90 capsule  3  . pantoprazole (PROTONIX) 40 MG tablet Take 1 tablet (40 mg total) by mouth daily.  30 tablet  5   No current facility-administered medications for this visit.     No Known Allergies:  Family History  Problem Relation Age of Onset  . Stroke Maternal Uncle   . Kidney failure Brother 36    has been on HD since age 43   :  History   Social History  . Marital Status: Widowed    Spouse  Name: N/A    Number of Children: N/A  . Years of Education: N/A   Occupational History  . retired    Social History Main Topics  . Smoking status: Former Smoker    Types: Cigarettes    Quit date: 10/05/2007  . Smokeless tobacco: Not on file  . Alcohol Use: No     Comment: hx of abuse per Epic notes 2009  . Drug Use: Yes    Special: "Crack" cocaine, Marijuana     Comment: per Epic note 2009  . Sexual Activity: Not Currently   Other Topics Concern  . Not on file   Social History Narrative   On disability - frostbite. Used to work for [employer]. Retired in 1990. Has 2 kids. 6th grade.    :  Pertinent items are noted in HPI.  Exam: ECOG 0 There were no vitals taken for this visit. General appearance: alert and cooperative Head: Normocephalic, without obvious abnormality Nose: Nares normal. Septum midline. Mucosa normal. No drainage or sinus tenderness. Throat: lips, mucosa, and tongue normal; teeth and gums normal Neck: no adenopathy Resp: clear to auscultation bilaterally Chest wall: no tenderness Cardio: regular rate and rhythm, S1, S2 normal, no murmur, click, rub or gallop GI: soft, non-tender; bowel sounds normal; no masses,  no organomegaly Extremities: extremities normal, atraumatic, no cyanosis or edema Pulses: 2+ and symmetric Lymph nodes: Cervical, supraclavicular, and axillary nodes normal.    Assessment and Plan:   63 year old gentleman with prostate cancer diagnosed in June of 2015. He presented with a PSA of 42.9 and a Gleason score of 4+3 equals 7. His pathology slides was reviewed with the reviewing pathologists. His imaging studies was also reviewed by radiology. His case was also discussed in the prostate cancer multidisciplinary clinic with consultation with Dr. Alinda Money and Dr. Valere Dross.   These treatments were discussed with the patient today which include surgery and possibly adjuvant radiation therapy more upfront radiation therapy with androgen depravation. He has high risk prostate cancer and had a high risk of developing systemic disease. He has not developed any systemic disease at this point at least by radiographic criteria. He is certainly at risk of developing such disease and this was discussed with him extensively today. He is highly motivated to get treatment done but not entirely sure which was way to proceed.   Both treatment modalities will be discussed with him today and he'll make a decision in the near future how to proceed. He understand that his disease becomes metastatic it will be incurable and probably will need systemic disease  and possibly chemotherapy. I see no definitive role for chemotherapy at this time but certainly would be a possibility or an option in the future should he develop measurable metastatic disease.

## 2014-05-28 NOTE — Progress Notes (Signed)
[facility] NEW PATIENT EVALUATION  Name: William Jones MRN: [id number]  Date:   05/28/2014           DOB: October 31, 1950  Status: outpatient   CC: Becky Sax, MD  Raynelle Bring, MD , Dr. Rana Snare   REFERRING PHYSICIAN: Raynelle Bring, MD   DIAGNOSIS:  Clinical stage T3a high-risk adenocarcinoma prostate  HISTORY OF PRESENT ILLNESS:  William Jones is a 63 y.o. male who is seen today through the courtesy of Dr. Alinda Money and Dr. Risa Grill at the prostate multidisciplinary clinic for evaluation of his clinical stage T3a high-risk adenocarcinoma prostate. He was initially seen by Dr. Risa Grill 2011 with an elevated PSA of over 30. Rectal examination was abnormal and he recommended ultrasound and biopsy.  The patient failed to keep his followup appointments. The patient stated that he did not have transportation. He was seen by Dr. Karlyn Agee at the [facility] and was noted to have a PSA of 42.9 up  from 35 back in 2012. He again had an abnormal examination and underwent ultrasound-guided biopsies on 03/05/2014. He had extensive Gleason 7 disease throughout his entire gland with (4+3) involving 90% of one core from the right base, 90% of one core from right lateral mid gland, Ron percent of one core from right lateral apex, 95% of one core from the right apex, 90% of one core from the left lateral base, 80% of one core from left lateral apex and 9% of one core from the left apex. He had (3+4) disease involving 50% of one core from right lateral base, 90% of one core from the right mid gland, 95% of one core from the left base, 95% of one core from left lateral mid gland and 90% of one core from the left mid gland. His gland volume was 45 cc. He is doing well from a GU and GI standpoint. His I PSS score is 7. He claims to be potent. His erectile function is improved with Cialis. His staging workup including a bone scan and CT scan are  without evidence for metastatic disease. There was a sclerotic lesions seen along the right iliac bone adjacent to his SI joint not seen on bone scan, and felt to represent degenerative changes. He does have a history of ethanol abuse and still drinks.  PREVIOUS RADIATION THERAPY: No   PAST MEDICAL HISTORY:  has a past medical history of History of frostbite; Prostate cancer (03/05/14); Iron deficiency anemia; Peripheral neuropathy; Duodenitis; Thrombocytopenia; and H/O: substance abuse.     PAST SURGICAL HISTORY:  Past Surgical History  Procedure Laterality Date  . Colonoscopy N/A 01/10/2014    Procedure: COLONOSCOPY;  Surgeon: [name], MD;  Location: [place];  Service: Endoscopy;  Laterality: N/A;  . Esophagogastroduodenoscopy N/A 01/10/2014    Procedure: ESOPHAGOGASTRODUODENOSCOPY (EGD);  Surgeon: [name], MD;  Location: [facility];  Service: Endoscopy;  Laterality: N/A;  . Givens capsule study N/A 01/10/2014    Procedure: GIVENS CAPSULE STUDY;  Surgeon: [name], MD;  Location: [place];  Service: Endoscopy;  Laterality: N/A;  . Prostate biopsy  03/05/14    Gleason 7, vol 45 gm     FAMILY HISTORY: family history includes Kidney failure (age of onset: 10) in his brother; Stroke in his maternal uncle.  His mother is alive and well at 55. He is unaware of his father's health.   SOCIAL HISTORY:  reports that he quit smoking  about 6 years ago. His smoking use included Cigarettes. He smoked 0.00 packs per day. He does not have any smokeless tobacco history on file. He reports that he uses illicit drugs ("Crack" cocaine and Marijuana). He reports that he does not drink alcohol.  Widowed, 2 children. He is on disability. Previously worked as a Sport and exercise psychologist.   ALLERGIES: Review of patient's allergies indicates no known allergies.   MEDICATIONS:  Current Outpatient Prescriptions  Medication Sig Dispense Refill  . ferrous sulfate 325 (65 FE) MG tablet Take 1 tablet (325  mg total) by mouth 3 (three) times daily with meals.  90 tablet  5  . gabapentin (NEURONTIN) 300 MG capsule TAKE 1 CAPSULE (300 MG TOTAL) BY MOUTH THREE   TIMES DAILY.  90 capsule  3  . pantoprazole (PROTONIX) 40 MG tablet Take 1 tablet (40 mg total) by mouth daily.  30 tablet  5   No current facility-administered medications for this encounter.     REVIEW OF SYSTEMS:  Pertinent items are noted in HPI.    PHYSICAL EXAM:  height is 5\' 10"  (1.778 m) and weight is 145 lb 1.6 oz (65.817 kg). His oral temperature is 98.5 F (36.9 C). His blood pressure is 178/107 and his pulse is 90. His respiration is 20.    Alert and oriented 63 year old African American male appearing his stated age. Head and neck examination: Grossly unremarkable. Nodes: Without palpable cervical or supraclavicular lymphadenopathy. Chest: Lungs clear. Abdomen: Without masses organomegaly. Genitalia: Unremarkable to inspection. Rectal: The prostate gland is diffusely indurated with distortion of the right lateral sulcus suggesting stage T3a disease. I do not feel involvement of the seminal vesicles. Extremities: Without edema.   LABORATORY DATA:  Lab Results  Component Value Date   WBC 4.3 01/23/2014   HGB 11.2* 01/23/2014   HCT 36.6* 01/23/2014   MCV 80.3 01/23/2014   PLT 321 01/23/2014   Lab Results  Component Value Date   NA 141 01/09/2014   K 4.1 01/09/2014   CL 105 01/09/2014   CO2 22 01/09/2014   Lab Results  Component Value Date   ALT 10 01/09/2014   AST 19 01/09/2014   ALKPHOS 41 01/09/2014   BILITOT 1.0 01/09/2014   PSA 42.92 from 01/09/2014   IMPRESSION: Clinical stageT3a high-risk adenocarcinoma prostate. I explained to the patient that his prognosis is related to his stage, Gleason score, and PSA level. His clinical stage and PSA level are distinctly unfavorable while his Gleason score of 7 is of intermediate favorability. Altogether he has high-risk disease. We discussed surgery versus radiation therapy. After lengthy  discussion he is most interested in external beam/IMRT. We  recommend androgen deprivation therapy for 2 years beginning 3 months prior to external beam radiation therapy. We  Also discussed the side effects of androgen deprivation therapy. I will get him back to see Dr. Risa Grill for initiation of androgen deprivation therapy and also placement of 3 gold seed markers sometime within the next one to 2 months. Follow visit with me in 2 months.   PLAN: As discussed above. Of note is that he has 2 phone numbers, [phone] and [phone].   I spent 45  minutes face to face with the patient and more than 50% of that time was spent in counseling and/or coordination of care.

## 2014-05-28 NOTE — Consult Note (Signed)
Chief Complaint  Prostate Cancer   Reason For Visit  To discuss treatment options for high risk prostate cancer Location of visit: [facility]   History of Present Illness  William Jones is a 63 year old gentleman seen in the [facility] today at the request of Dr. Rana Snare.  He initially presented to Dr. Risa Grill in 2011 for an elevated PSA of 32 and an abnormal digital rectal exam noted at the community prostate cancer screening. He was recommended to undergo a prostate biopsy at that time but did not follow up as recommended.  He presented again to Dr. Risa Grill in 2015 after his PSA was noted to be 42.9 during a hospital admission for fatigue and anemia. He was also noted to have diffuse bilateral induration of the prostate without clear evidence of extraprostatic disease. He underwent a prostate needle biopsy on 03/05/14 which confirmed 12 out of 12 biopsy cores to be positive for malignancy. Gleason score was 4+3=7.  Staging studies on 04/25/14 including a bone scan and CT scan of the abdomen and pelvis were negative for metastatic disease. He has no known family history of prostate cancer.  ** His medical comorbidities include a history of alcohol and substance abuse.  He reported quit drinking in April 2015 although he admits that he has resumed drinking and typically drinks a "half a pint per day". He also has iron deficiency anemia which has improved since treatment with FeSO4.  He did undergo a GI evaluation and was apparently found to have a duodenal ulcer and has been treated with a proton pump inhibitor.  TNM stage: cT2c N0 M0 PSA: 42.9 Gleason score: 4+3=7 Biopsy (03/05/14): 12/12 cores positive    Left: L lateral apex (80%, 4+3=7), L apex (90%, 4+3=7, PNI), L lateral mid (95%, 3+4=7, PNI), L mid (90%, 3+4=7, PNI), L lateral base (90%, 4+3=7, PNI), L base (95%, 3+4=7, PNI)    Right: R apex (95%, 4+3=7, PNI), R lateral  apex (100%, 4+3=7), R mid (90%, 3+4=7, PNI), R lateral mid (90%, 4+3=7, PNI), R base (90%, 4+3=7), R lateral base (50%, 3+4=7) Prostate volume: 45 cc  Nomogram OC disease: 1% EPE: 99% SVI: 74% LNI: 75% PFS (surgery): 12%, 7%  Urinary function: He does have urinary frequency which may be associated with his fluid intake and alcohol intake.  IPSS is 7.  His overall level bother his low. Erectile function: He currently has preserved erectile dysfunction.  SHIM score is 25.   Past Medical History  1. History of Duodenitis (535.60)  2. History of Erectile dysfunction (607.84)  3. History of alcohol abuse (305.03)  4. History of hematuria (V13.09)  5. History of iron deficiency anemia (V12.3)  6. History of peripheral neuropathy (V12.49)  7. History of substance abuse (V13.89)  8. History of thrombocytopenia (V12.3)  Current Meds  1. Ferrous Sulfate 325 MG CAPS;  Therapy: (Recorded:28Apr2015) to Recorded  2. Gabapentin 300 MG Oral Capsule;  Therapy: 16Apr2015 to Recorded  3. Pantoprazole Sodium 40 MG Oral Tablet Delayed Release;  Therapy: 22Apr2015 to Recorded  Allergies  1. No Known Drug Allergies  Family History  1. No pertinent family history  Social History   Alcohol use (V49.89)   Former smoker [place])   Number of children   Unemployed (V62.0)   Widowed  Review of Systems AU Complete-Male: Constitutional, skin, eye, otolaryngeal, hematologic/lymphatic, cardiovascular, pulmonary, endocrine, musculoskeletal, gastrointestinal, neurological and psychiatric system(s) were reviewed and pertinent findings if present are  noted.  Genitourinary: no hematuria.  Constitutional: no recent weight loss.  Cardiovascular: no leg swelling.  Respiratory: no shortness of breath and no cough.    Physical Exam Constitutional: Well nourished and well developed . No acute distress.  ENT:. The ears and nose are normal in appearance.  Neck: The appearance of the neck is normal and no  neck mass is present.  Pulmonary: No respiratory distress, normal respiratory rhythm and effort and clear bilateral breath sounds.  Cardiovascular: Heart rate and rhythm are normal . No peripheral edema.  Abdomen: The abdomen is soft and nontender. No masses are palpated. No CVA tenderness. No hernias are palpable. No hepatosplenomegaly noted.  Rectal: Rectal exam demonstrates normal sphincter tone, no tenderness and no masses. Prostate size is estimated to be 50 g. He has significant induration of the entire prostate. There is no clear edge of the prostate noted toward the right base and seminal vesicle which raises significant concern for locally advanced disease. The prostate is not tender. The left seminal vesicle is nonpalpable. The right seminal vesicle is nonpalpable. The perineum is normal on inspection.  Lymphatics: The femoral and inguinal nodes are not enlarged or tender.  Skin: Normal skin turgor, no visible rash and no visible skin lesions.  Neuro/Psych:. Mood and affect are appropriate.    Results/Data Selected Results  BUN & CREATININE [id number] 08:21AM Rana Snare  SPECIMEN TYPE: BLOOD   Test Name Result Flag Reference  CREATININE 1.10 mg/dL  0.50-1.50  BUN 7 mg/dL  6-23  Est GFR, African American 83 mL/min    PERFORMED AT:        [facility]                      [place].                      [place],  [address]  Est GFR, NonAfrican American 72 mL/min    THE ESTIMATED GFR IS A CALCULATION VALID FOR ADULTS (>=74 YEARS OLD) THAT USES THE CKD-EPI ALGORITHM TO ADJUST FOR AGE AND SEX. IT IS   NOT TO BE USED FOR CHILDREN, PREGNANT WOMEN, HOSPITALIZED PATIENTS,    PATIENTS ON DIALYSIS, OR WITH RAPIDLY CHANGING KIDNEY FUNCTION. ACCORDING TO THE NKDEP, EGFR >89 IS NORMAL, 60-89 SHOWS MILD IMPAIRMENT, 30-59 SHOWS MODERATE IMPAIRMENT, 15-29 SHOWS SEVERE IMPAIRMENT AND <15 IS ESRD.   CT-ABD/PELVIS WITH CONTRAST [id number] 12:00AM Rana Snare   Test Name  Result Flag Reference  CT-ABD/PELVIS WITH CONTRAST (Report)    ** [facility] **   CLINICAL DATA: Prostate cancer.  EXAM: CT ABDOMEN AND PELVIS WITH CONTRAST  TECHNIQUE: Multidetector CT imaging of the abdomen and pelvis was performed using the standard protocol following bolus administration of intravenous contrast.  CONTRAST: 100 cc Isovue-300  COMPARISON: None.  FINDINGS: The lung bases are clear of acute process. There are scarring changes and a few scattered cysts. No pleural effusion or pulmonary nodule. The heart is normal in size. No pericardial effusion. The distal esophagus is grossly normal.  The liver is unremarkable. No focal hepatic lesions or intrahepatic biliary dilatation. A few tiny scattered probable cysts are noted. The gallbladder is normal. No common bowel duct dilatation. The pancreas is normal. The spleen is normal. The adrenal glands are unremarkable. Mild nodularity but no discrete mass. The kidneys are normal. No renal mass or hydronephrosis. No renal calculi.  The stomach, duodenum, small bowel and colon  are grossly normal without oral contrast. No obvious inflammatory changes, mass lesions or obstructive findings. The appendix is normal. No mesenteric or retroperitoneal mass or lymphadenopathy. Small scattered lymph nodes are noted. The aorta is normal in caliber. The major branch vessels are patent. The major venous structures are patent.  The prostate gland is enlarged with median lobe hypertrophy impressing on the base of the bladder. No pelvic mass or lymphadenopathy. No inguinal mass or at.  The bony structures are unremarkable. There is an area of mild cortical thickening and bony sclerosis along the iliac aspect of the right SI joint. Correlation with scheduled bone scan is suggested.  IMPRESSION: 1. Mildly enlarged prostate gland but no periprostatic mass or pelvic or retroperitoneal  lymphadenopathy. 2. Small sclerotic area involving the right iliac bone. Correlation with schedule bone scan is suggested. No other bone lesions. 3. Small scattered liver lesions are likely benign cysts.   Electronically Signed  By: Kalman Jewels M.D.  On: 04/25/2014 09:38     I have independently reviewed his recent bone scan and CT scan from 04/25/14.  Findings are as dictated above.  His imaging studies and pathology slides were reviewed today at the multidisciplinary conference.  Assessment  1. Prostate cancer (185)  Discussion/Summary  1.  High-risk, locally advanced prostate cancer: I had a detailed discussion with William Jones today regarding his cancer situation and his options for treatment.  Although he is at very high risk for micrometastatic disease, he has no clear evidence of metastatic disease and is a candidate to undergo aggressive multimodality curative therapy.   The patient was counseled about the natural history of prostate cancer and the standard treatment options that are available for prostate cancer. It was explained to him how his age and life expectancy, clinical stage, Gleason score, and PSA affect his prognosis, the decision to proceed with additional staging studies, as well as how that information influences recommended treatment strategies. We discussed the roles for active surveillance, radiation therapy, surgical therapy, androgen deprivation, as well as ablative therapy options for the treatment of prostate cancer as appropriate to his individual cancer situation. We discussed the risks and benefits of these options with regard to their impact on cancer control and also in terms of potential adverse events, complications, and impact on quiality of life particularly related to urinary, bowel, and sexual function. The patient was encouraged to ask questions throughout the discussion today and all questions were answered to his stated satisfaction. In addition, the  patient was provided with and/or directed to appropriate resources and literature for further education about prostate cancer and treatment options.   We discussed surgical therapy for prostate cancer including the different available surgical approaches. We discussed, in detail, the risks and expectations of surgery with regard to cancer control, urinary control, and erectile function as well as the expected postoperative recovery process. Additional risks of surgery including but not limited to bleeding, infection, hernia formation, nerve damage, lymphocele formation, bowel/rectal injury potentially necessitating colostomy, damage to the urinary tract resulting in urine leakage, urethral stricture, and the cardiopulmonary risks such as myocardial infarction, stroke, death, venothromboembolism, etc. were explained. The risk of open surgical conversion for robotic/laparoscopic prostatectomy was also discussed.   We reviewed the pros and cons of primary surgical therapy versus primary radiotherapy in combination with long-term androgen deprivation treatment.  Considering his exam today, I have concern that all of his gross disease may not yield to be removed surgically.  We also discussed the very low  probability of cure with surgical therapy alone.  After an extensive review of the pros and cons of each treatment approach, he is leaning toward proceeding with external beam radiation therapy with IMRT in combination with long-term deprivation therapy.  He is scheduled to see Dr. Valere Dross this afternoon as well.  Assuming that he confirms this decision with Dr. Valere Dross, I will notify Dr. Risa Grill of his decision so that he can proceed with initiation of androgen deprivation therapy and fiducial marker placement.  Cc: Dr. Rana Snare Dr. Karlyn Agee Dr. Arloa Koh Dr. Zola Button  A total of 55 minutes were spent in the overall care of the patient today with 55 minutes in direct face to face consultation.     Signatures Electronically signed by : Raynelle Bring, M.D.; May 28 2014  3:32PM EST

## 2014-05-28 NOTE — Progress Notes (Signed)
Please see consult note.  

## 2014-05-28 NOTE — Progress Notes (Signed)
I met with patient and his spouse, Meredith Mody, in the [facility] today. I provided them with information on available services in the Patient and [facility], as well as my contact information. The patient was mildly distressed on the distress screen with a 1; however, he began to cry when talking about the conflict with his spouse over his drinking and said he desired to stop drinking. I will make a referral to CSW to assist him with this matter.  Epifania Gore, PhD, [place]

## 2014-05-29 ENCOUNTER — Telehealth: Payer: Self-pay | Admitting: *Deleted

## 2014-05-29 ENCOUNTER — Encounter: Payer: Self-pay | Admitting: Internal Medicine

## 2014-05-29 NOTE — Telephone Encounter (Signed)
CALLED PATIENT TO INFORM OF GOLD SEED PLACEMENT ON 06-07-14 @ 9 AM @ DR. GRAPEY'S OFFICE AND HIS [place] VISIT ON 07-30-14- ARRIVAL 8:30 AM @ DR. MURRAY'S OFFICE, SPOKE WITH PATIENT AND HE IS AWARE OF THESE APPTS.

## 2014-05-29 NOTE — Telephone Encounter (Signed)
Called patient to inform that he needs to call [facility] and speak to [facility] - phone number [phone] ext. [phone] before I can arrange his gold seed placement, spoke with patient and he verified understanding this.

## 2014-05-29 NOTE — Progress Notes (Addendum)
Met with patient as part of Prostate MDC.  Reintroduced my role as his navigator and encouraged him to call as he proceeds with treatments and appointments at [facility].  Provided the accompanying Care Plan Summary at the end of his visit:                                            Care Plan Summary  Name:  [name]      DOB:  [birth year]  Your Medical Team:   Urologist -  Dr. Raynelle Bring, [facility]  Radiation Oncologist - Dr. Arloa Koh, [facility]   Medical Oncologist - Dr. Zola Button, [place] Recommendations: 1) Hormone therapy. 2) Radiation treatment in approximately 2 months. * These recommendations are based on information available as of today's consult.      Recommendations may change depending on the results of further tests or exams. Next Steps: 1) Dr. Cy Blamer office to contact you for hormone shots. 2) Dr. Charlton Amor office will contact you to schedule a follow-up appointment to further  discuss radiation. When appointments need to be scheduled, you will be contacted by [facility] and/or [facility].  Questions?  Please do not hesitate to call Gayleen Orem, RN, BSN, [facility] at [phone] with any questions or concerns.  Liliane Channel is Counsellor and is available to assist you while you're receiving your medical care at [facility]. ______________________________________________________________________________________________________________________  I encouraged him to call me with any questions or concerns as his treatments progress.  He indicated understanding.  Gayleen Orem, RN, BSN, [place] [facility] [place] [phone]

## 2014-05-30 ENCOUNTER — Encounter: Payer: Self-pay | Admitting: *Deleted

## 2014-05-30 NOTE — Progress Notes (Signed)
[place] Work  Clinical Social Work was referred by Clinical biochemist for assessment of psychosocial needs and substance abuse referral.  Clinical Social Worker contacted patient by phone to offer support and assess for needs.  [name] stated he is "ready to stop drinking and does not know where to start".  The patient shared he drinks about half a pint of liquor a day and has not quit drinking at this time.  He has participated in meetings in the past and found them helpful at the time.  CSW and patient explored several options and discussed AA meetings as a helpful tool long term, but the need for more intensive services at this time.  CSW provided information for [facility] and [facility].  CSW instructed patient to contact either agency to begin treatment as soon as possible- they would advise on detox process.  CSW recommended if patient chose to detox at home, should he have any withdrawal symptoms, to go to ED immediately.  Patient verbalized understanding and will follow up with CSW as needed.  CSW will follow up with Mr. [place] at a later time to receive an update.   Polo Riley, MSW, LCSW, OSW-C Clinical Social Worker [facility] [phone]

## 2014-06-07 DIAGNOSIS — C61 Malignant neoplasm of prostate: Secondary | ICD-10-CM | POA: Diagnosis not present

## 2014-07-09 DIAGNOSIS — C61 Malignant neoplasm of prostate: Secondary | ICD-10-CM | POA: Diagnosis not present

## 2014-07-09 DIAGNOSIS — Z5111 Encounter for antineoplastic chemotherapy: Secondary | ICD-10-CM | POA: Diagnosis not present

## 2014-07-25 NOTE — Progress Notes (Signed)
GU Location of Tumor / Histology: prostate adenocarcinoma  If Prostate Cancer, Gleason Score is (4 + 3) and PSA is (42.92 on 02/05/14)   2012 PSA [place] presented 4 months ago with signs/symptoms of: referred from [facility] for elevated PSA   Biopsies of prostate revealed:  03/05/14 Volume 45 cc   Past/Anticipated interventions by urology, if any: biopsy. Patient was also seen by urologist x 1 in 2011 for elevated PSA > 30. Pt failed to keep FU appt.  Past/Anticipated interventions by medical oncology, if any: seen in prostate [place] on 05/28/14, Dr Alen Blew- no definite role for chemotherapy at this point.  Weight changes, if any: no  Bowel/Bladder complaints, if any: IPSS 7, urinary frequency every 2 hours or less, nocturia x 2  Nausea/Vomiting, if any: no  Pain issues, if any: no   SAFETY ISSUES:  Prior radiation? no  Pacemaker/ICD? no  Possible current pregnancy? na  Is the patient on methotrexate? No  Current Complaints / other details: Widowed, 1 son, 1 daughter, unemployed  No family history of prostate cancer known.  Patient's self-completed 'physical symptoms within the past few weeks' indicates change in stool habits and change in stool color. Seen in prostate [place] on 05/28/14.

## 2014-07-29 ENCOUNTER — Encounter: Payer: Self-pay | Admitting: Radiation Oncology

## 2014-07-29 NOTE — Progress Notes (Signed)
Chart note: The patient called to postpone his follow-up visit with me until November 17. I'm not sure of the reason for his postponement.

## 2014-07-30 ENCOUNTER — Ambulatory Visit: Payer: Medicare Other

## 2014-07-30 ENCOUNTER — Ambulatory Visit
Admission: RE | Admit: 2014-07-30 | Discharge: 2014-07-30 | Disposition: A | Discharge status: Home / Self Care | Payer: Medicare Other | Source: Ambulatory Visit | Attending: Radiation Oncology | Admitting: Radiation Oncology

## 2014-08-12 DIAGNOSIS — Z5111 Encounter for antineoplastic chemotherapy: Secondary | ICD-10-CM | POA: Diagnosis not present

## 2014-08-12 DIAGNOSIS — C61 Malignant neoplasm of prostate: Secondary | ICD-10-CM | POA: Diagnosis not present

## 2014-08-16 ENCOUNTER — Encounter: Payer: Self-pay | Admitting: *Deleted

## 2014-08-16 NOTE — Progress Notes (Unsigned)
Spoke with Theadora Rama, CMA, Dr Cy Blamer office who states patient did not have gold seed markers placed. He did begin [place], has received 3 doses. He will be given Lupron in Dec 2015. Per Dr Valere Dross, requested Brandy have Dr Risa Grill place markers asap so his radiation can be planned and initiated.  Patient to be seen by Dr Valere Dross on 08/20/14 for FU new consult. Brandy verbalized understanding.

## 2014-08-16 NOTE — Progress Notes (Addendum)
GU Location of Tumor / Histology: prostate adenocarcinoma  If Prostate Cancer, Gleason Score is (4 + 3) and PSA is (42.92 on 02/05/14) 2012 PSA [place] presented 4 months ago with signs/symptoms of: referred from [facility] for elevated PSA  Biopsies of prostate revealed:  03/05/14 Volume 45 cc    Past/Anticipated interventions by urology, if any: biopsy. Patient was also seen by urologist x 1 in 2011 for elevated PSA > 30. Pt failed to keep FU appt.  Past/Anticipated interventions by medical oncology, if any: seen in prostate [place] 05/2014 , Dr Alen Blew- no definitive role for chemotherapy at this point  Weight changes, if any: no  Bowel/Bladder complaints, if any: IPSS 7, urinary frequency every 2 hours or less, nocturia x 2  Nausea/Vomiting, if any: no  Pain issues, if any: no  SAFETY ISSUES:  Prior radiation? no  Pacemaker/ICD? no  Possible current pregnancy? na  Is the patient on methotrexate? No  Current Complaints / other details: Widowed, 1 son, 1 daughter, unemployed No family history of prostate cancer known.  Firmagon injections x 3, last dose on 08/12/14. Lupron to be given in Dec 2015.   08/16/14  GOLD SEED MARKERS TO BE PLACED ASAP         No c/o pain, no dysuria, no hematuria, good stream, nocturia x 2 ,  regular bowel movements  B/p high 162/108, not on b/p medication,  IPSS results=9

## 2014-08-20 ENCOUNTER — Encounter: Payer: Self-pay | Admitting: Radiation Oncology

## 2014-08-20 ENCOUNTER — Ambulatory Visit
Admission: RE | Admit: 2014-08-20 | Discharge: 2014-08-20 | Disposition: A | Discharge status: Home / Self Care | Payer: Medicare Other | Source: Ambulatory Visit | Attending: Radiation Oncology | Admitting: Radiation Oncology

## 2014-08-20 ENCOUNTER — Telehealth: Payer: Self-pay | Admitting: Radiation Oncology

## 2014-08-20 VITALS — BP 162/108 | HR 92 | Temp 97.9°F | Resp 20 | Ht 69.0 in | Wt 148.5 lb

## 2014-08-20 DIAGNOSIS — C61 Malignant neoplasm of prostate: Secondary | ICD-10-CM | POA: Diagnosis not present

## 2014-08-20 NOTE — Progress Notes (Signed)
CC: Dr. Rana Snare, Dr. Dorna Mai  Follow-up note:  William Jones is a pleasant 63 year old male who is seen today for review and scheduling of his radiation therapy in the management of his clinical stage T3a  high risk adenocarcinoma prostate.I first saw him at the multidisciplinary clinic on 05/28/2014. He was initially seen by Dr. Risa Grill 2011 with an elevated PSA of over 30. Rectal examination was abnormal and he recommended ultrasound and biopsy. The patient failed to keep his followup appointments. The patient stated that he did not have transportation. He was seen by Dr. Karlyn Agee at [facility] and was noted to have a PSA of 42.9 up from 35 back in 2012. He again had an abnormal examination and underwent ultrasound-guided biopsies on 03/05/2014. He had extensive Gleason 7 disease throughout his entire gland with (4+3) involving 90% of one core from the right base, 90% of one core from right lateral mid gland, 100 percent of one core from right lateral apex, 95% of one core from the right apex, 90% of one core from the left lateral base, 80% of one core from left lateral apex and 9% of one core from the left apex. He had (3+4) disease involving 50% of one core from right lateral base, 90% of one core from the right mid gland, 95% of one core from the left base, 95% of one core from left lateral mid gland and 90% of one core from the left mid gland. His gland volume was 45 cc. His I PSS score was 7. He had has sexually active. His staging workup was without evidence for metastatic disease. He elected external beam/IMRT along with androgen deprivation therapy. This was started in early September. He is scheduled for a Lupron injection on December 11. He does report hot flashes but these are not particular bothersome. He tells me that he is still sexually active. Of note is that his previous testicular discomfort is now gone. His I PSS score today is 9. He missed his  appointment with me last month and he has not had placement of his gold seed markers. Our department has notified Dr. Risa Grill to have 3 gold seed markers placed.  Physical examination: Alert and oriented. Filed Vitals:   08/20/14 0734  BP: 162/108  Pulse: 92  Temp: 97.9 F (36.6 C)  Resp: 20   Rectal examination: The previously noted induration and distortion of right lateral sulcus is less distinct.  Impression: Clinical stageT3a high-risk adenocarcinoma prostate. We again discussed the rationale for external beam/IMRT along with androgen deprivation therapy. He needs irradiation of his prostate and pelvic lymph nodes. We again discussed the potential acute and late toxicities of radiation therapy. We talked about the need for a comfortably full bladder to minimize urinary toxicity.  We need to have him see Dr. Risa Grill as soon as possible for placement of 3 gold seed markers, and then have him return here for CT simulation. Consent is signed today.  Plan: As above.  30 minutes was spent face-to-face with the patient, primarily counseling patient and coordinating his care.  Lane:

## 2014-08-20 NOTE — Addendum Note (Signed)
Encounter addended by: Rexene Edison, MD on: 08/20/2014  8:26 AM<BR>     Documentation filed: Arn Medal VN

## 2014-08-20 NOTE — Telephone Encounter (Signed)
Called and spoke w/William Jones regarding his 12/3 appt. For fiducial markers to be placed @ 9:30a. He will return to [place] for CT SIM on 09/09/14 @ 8:00a. He was agreeable.

## 2014-08-20 NOTE — Progress Notes (Signed)
Please see the Nurse Progress Note in the MD Initial Consult Encounter for this patient. 

## 2014-09-02 ENCOUNTER — Encounter: Payer: Self-pay | Admitting: Podiatry

## 2014-09-02 ENCOUNTER — Ambulatory Visit (INDEPENDENT_AMBULATORY_CARE_PROVIDER_SITE_OTHER): Payer: Medicare Other | Admitting: Podiatry

## 2014-09-02 DIAGNOSIS — M79676 Pain in unspecified toe(s): Secondary | ICD-10-CM

## 2014-09-02 DIAGNOSIS — Q828 Other specified congenital malformations of skin: Secondary | ICD-10-CM | POA: Diagnosis not present

## 2014-09-02 DIAGNOSIS — B351 Tinea unguium: Secondary | ICD-10-CM | POA: Diagnosis not present

## 2014-09-03 NOTE — Progress Notes (Signed)
Patient ID: William Jones, male   DOB: May 06, 1951, 63 y.o.   MRN: [id number]  Subjective: This patient presents complaining of painful toenails and keratoses  Objective: Hypertrophic, elongated, incurvated, discolored toenails 1-5 right and 1, 3, 5, left Nucleated keratoses plantar left hallux Distal keratoses second left Plantar keratoses fifth MPJ right  Assessment: Symptomatic onychomycoses 8 Porokeratosis 1 Keratoses 3  Plan: Debrided toenails 8 keratoses 4 without a bleeding  Reappoint 3 months

## 2014-09-05 DIAGNOSIS — C61 Malignant neoplasm of prostate: Secondary | ICD-10-CM | POA: Diagnosis not present

## 2014-09-09 ENCOUNTER — Ambulatory Visit
Admission: RE | Admit: 2014-09-09 | Discharge: 2014-09-09 | Disposition: A | Discharge status: Home / Self Care | Payer: Medicare Other | Source: Ambulatory Visit | Attending: Radiation Oncology | Admitting: Radiation Oncology

## 2014-09-09 DIAGNOSIS — Z510 Encounter for antineoplastic radiation therapy: Secondary | ICD-10-CM | POA: Insufficient documentation

## 2014-09-09 DIAGNOSIS — C61 Malignant neoplasm of prostate: Secondary | ICD-10-CM | POA: Diagnosis not present

## 2014-09-09 NOTE — Progress Notes (Signed)
Complex simulation/treatment planning note: The patient was taken to the CT simulator.  A Vac lock immobilization device was constructed.  A red rubber tube was placed within the rectal vault.  He was then catheterized and contrast instilled into the bladder/rectum.  His pelvis was scanned.  I chose an isocenter along the central prostate.  The CT data set was sent to the  MIM planning system right contoured his prostate/GTV, seminal vesicles, rectum, bladder, and rectosigmoid colon.  I also contoured PTV56 lymph node  to include the seminal vesicle PTV representing the seminal vesicles +0.5 cm.  I'm prescribing 7800 cGy to the prostate PTV which represents the prostate was 0.8 cm except for 0.5 cm along the rectum.  The lymph node PTV56 will receive 5600 cGy in 40 sessions.  He is to be treated with VMAT dual ARC IMRT.

## 2014-09-11 ENCOUNTER — Other Ambulatory Visit: Payer: Self-pay

## 2014-09-13 DIAGNOSIS — C61 Malignant neoplasm of prostate: Secondary | ICD-10-CM | POA: Diagnosis not present

## 2014-09-13 DIAGNOSIS — Z5111 Encounter for antineoplastic chemotherapy: Secondary | ICD-10-CM | POA: Diagnosis not present

## 2014-09-16 ENCOUNTER — Encounter: Payer: Self-pay | Admitting: Radiation Oncology

## 2014-09-16 DIAGNOSIS — C61 Malignant neoplasm of prostate: Secondary | ICD-10-CM | POA: Diagnosis not present

## 2014-09-16 NOTE — Progress Notes (Signed)
IMRT simulation/treatment planning note: the patient completed his IMRT simulation/treatment planning today in the management of his high risk cancer of the prostate.  IMRT was chosen to decrease the risk for both acute in late bladder and rectal toxicity compared to conventional or 3-D conformal radiation therapy.  Dose volume histograms were obtained for the target structures including the prostate/seminal  Vesicles, and pelvic lymph nodes in addition to avoidance structures including the bladder, rectum, and femoral heads.  We met our departmental guidelines.  I am prescribing 7800 cGy in 40 sessions to his prostate PTV and 5600 cGy in 40 sessions to his seminal vesicles and pelvic lymph nodes.  He is being treated with dual ARC VMAT IMRT /6 MV photons.

## 2014-09-17 DIAGNOSIS — C61 Malignant neoplasm of prostate: Secondary | ICD-10-CM | POA: Diagnosis not present

## 2014-09-18 ENCOUNTER — Ambulatory Visit
Admission: RE | Admit: 2014-09-18 | Discharge: 2014-09-18 | Disposition: A | Discharge status: Home / Self Care | Payer: Medicare Other | Source: Ambulatory Visit | Attending: Radiation Oncology | Admitting: Radiation Oncology

## 2014-09-18 DIAGNOSIS — C61 Malignant neoplasm of prostate: Secondary | ICD-10-CM | POA: Diagnosis not present

## 2014-09-18 NOTE — Progress Notes (Signed)
Chart note: The patient began IMRT today for management of his cancer the prostate.  He is being treated with dual ARC VMAT IMRT.  We are utilizing 2 sets of dynamic MLCs corresponding to one set of IMRT treatment devices [phone]).

## 2014-09-19 ENCOUNTER — Ambulatory Visit
Admission: RE | Admit: 2014-09-19 | Discharge: 2014-09-19 | Disposition: A | Discharge status: Home / Self Care | Payer: Medicare Other | Source: Ambulatory Visit | Attending: Radiation Oncology | Admitting: Radiation Oncology

## 2014-09-19 DIAGNOSIS — C61 Malignant neoplasm of prostate: Secondary | ICD-10-CM | POA: Diagnosis not present

## 2014-09-20 ENCOUNTER — Ambulatory Visit
Admission: RE | Admit: 2014-09-20 | Discharge: 2014-09-20 | Disposition: A | Discharge status: Home / Self Care | Payer: Medicare Other | Source: Ambulatory Visit | Attending: Radiation Oncology | Admitting: Radiation Oncology

## 2014-09-20 DIAGNOSIS — C61 Malignant neoplasm of prostate: Secondary | ICD-10-CM | POA: Diagnosis not present

## 2014-09-23 ENCOUNTER — Ambulatory Visit
Admission: RE | Admit: 2014-09-23 | Discharge: 2014-09-23 | Disposition: A | Discharge status: Home / Self Care | Payer: Medicare Other | Source: Ambulatory Visit | Attending: Radiation Oncology | Admitting: Radiation Oncology

## 2014-09-23 ENCOUNTER — Encounter: Payer: Self-pay | Admitting: Radiation Oncology

## 2014-09-23 VITALS — BP 144/105 | HR 115 | Temp 98.4°F | Resp 20 | Wt 148.8 lb

## 2014-09-23 DIAGNOSIS — C61 Malignant neoplasm of prostate: Secondary | ICD-10-CM

## 2014-09-23 NOTE — Progress Notes (Signed)
Patient denies pain, fatigue, loss of appetite. He states he urinates frequently during the daytime, denies other urinary issues. He states he has hx of stools that vary from "hard" to soft, dark to brown. He states the  past several days he has had BMs with bleeding on the tissue, but not every time. He reports a hx of hemorrhoids.  Re: hypertension- pt states "I eat a lot of salt." Per Epic flow sheet pt has hx of hypertension dating back to Aug 2015 and a notation that his PCP, Dr Aundra Dubin was sent in basket re: hypertension.  Patient education completed with patient. Gave him "Radiation and You " booklet with all pertinent information marked and discussed, re: rectal soreness/care, fatigue, urinary/bladder irritation/care, nutrition, pain.

## 2014-09-23 NOTE — Progress Notes (Signed)
   Weekly Management Note:  outpatient    ICD-9-CM ICD-10-CM   1. Prostate cancer 185 C61     Current Dose:  7.8 Gy  Projected Dose: 78 Gy   Narrative:  The patient presents for routine under treatment assessment.  CBCT/MVCT images/Port film x-rays were reviewed.  The chart was checked. No complaints  Physical Findings:  weight is 148 lb 12.8 oz (67.495 kg). His temperature is 98.4 F (36.9 C). His blood pressure is 144/105 and his pulse is 115. His respiration is 20.  NAD  Impression:  The patient is tolerating radiotherapy.  Plan:  Continue radiotherapy as planned.   Recheck BP in 1 week.  ________________________________   Eppie Gibson, M.D.

## 2014-09-24 ENCOUNTER — Ambulatory Visit
Admission: RE | Admit: 2014-09-24 | Discharge: 2014-09-24 | Disposition: A | Discharge status: Home / Self Care | Payer: Medicare Other | Source: Ambulatory Visit | Attending: Radiation Oncology | Admitting: Radiation Oncology

## 2014-09-24 DIAGNOSIS — C61 Malignant neoplasm of prostate: Secondary | ICD-10-CM | POA: Diagnosis not present

## 2014-09-25 ENCOUNTER — Ambulatory Visit
Admission: RE | Admit: 2014-09-25 | Discharge: 2014-09-25 | Disposition: A | Discharge status: Home / Self Care | Payer: Medicare Other | Source: Ambulatory Visit | Attending: Radiation Oncology | Admitting: Radiation Oncology

## 2014-09-25 DIAGNOSIS — C61 Malignant neoplasm of prostate: Secondary | ICD-10-CM | POA: Diagnosis not present

## 2014-09-26 ENCOUNTER — Ambulatory Visit
Admission: RE | Admit: 2014-09-26 | Discharge: 2014-09-26 | Disposition: A | Discharge status: Home / Self Care | Payer: Medicare Other | Source: Ambulatory Visit | Attending: Radiation Oncology | Admitting: Radiation Oncology

## 2014-09-26 DIAGNOSIS — C61 Malignant neoplasm of prostate: Secondary | ICD-10-CM | POA: Diagnosis not present

## 2014-09-30 ENCOUNTER — Encounter: Payer: Self-pay | Admitting: Radiation Oncology

## 2014-09-30 ENCOUNTER — Ambulatory Visit
Admission: RE | Admit: 2014-09-30 | Discharge: 2014-09-30 | Disposition: A | Discharge status: Home / Self Care | Payer: Medicare Other | Source: Ambulatory Visit | Attending: Radiation Oncology | Admitting: Radiation Oncology

## 2014-09-30 VITALS — BP 155/104 | HR 86 | Temp 98.3°F | Resp 20 | Wt 150.1 lb

## 2014-09-30 DIAGNOSIS — C61 Malignant neoplasm of prostate: Secondary | ICD-10-CM | POA: Diagnosis not present

## 2014-09-30 NOTE — Progress Notes (Signed)
Weekly Management Note:  Site: Prostate/pelvic lymph nodes Current Dose:  1560  cGy Projected Dose: 7800  cGy  Narrative: The patient is seen today for routine under treatment assessment. CBCT/MVCT images/port films were reviewed. The chart was reviewed.   Bladder filling is less than ideal.  He tells me he has a difficult time holding his urine.  He states that he does have hemorrhoidal irritation and he is been having intermittent rectal bleeding.  He is not on a stool softener.  Physical Examination:  Filed Vitals:   09/30/14 0829  BP: 155/104  Pulse: 86  Temp: 98.3 F (36.8 C)  Resp: 20  .  Weight: 150 lb 1.6 oz (68.085 kg).  No change.  Impression: Tolerating radiation therapy well, however, he does have intermittent rectal bleeding, possibly from his hemorrhoids.  It is much too early to expect radiation proctitis.  I encouraged him to improve his bladder filling.  He is to start a stool softener.  Plan: Continue radiation therapy as planned.

## 2014-09-30 NOTE — Progress Notes (Addendum)
Weekly rad txs prostate 8/40 completd, slight dysuria at times, increased urgency/frequency, nocturia x3, states blood in stool for about 1-2 weks, c/o lower right back pain, feels he has hemrrhoids, appetite good, energy good, drinks 6-8 glasses water daily 8:32 AM

## 2014-10-01 ENCOUNTER — Ambulatory Visit
Admission: RE | Admit: 2014-10-01 | Discharge: 2014-10-01 | Disposition: A | Discharge status: Home / Self Care | Payer: Medicare Other | Source: Ambulatory Visit | Attending: Radiation Oncology | Admitting: Radiation Oncology

## 2014-10-01 DIAGNOSIS — C61 Malignant neoplasm of prostate: Secondary | ICD-10-CM | POA: Diagnosis not present

## 2014-10-01 DIAGNOSIS — Z23 Encounter for immunization: Secondary | ICD-10-CM | POA: Diagnosis not present

## 2014-10-02 ENCOUNTER — Ambulatory Visit
Admission: RE | Admit: 2014-10-02 | Discharge: 2014-10-02 | Disposition: A | Discharge status: Home / Self Care | Payer: Medicare Other | Source: Ambulatory Visit | Attending: Radiation Oncology | Admitting: Radiation Oncology

## 2014-10-02 DIAGNOSIS — C61 Malignant neoplasm of prostate: Secondary | ICD-10-CM | POA: Diagnosis not present

## 2014-10-03 ENCOUNTER — Encounter: Payer: Self-pay | Admitting: Radiation Oncology

## 2014-10-03 ENCOUNTER — Ambulatory Visit
Admission: RE | Admit: 2014-10-03 | Discharge: 2014-10-03 | Disposition: A | Discharge status: Home / Self Care | Payer: Medicare Other | Source: Ambulatory Visit | Attending: Radiation Oncology | Admitting: Radiation Oncology

## 2014-10-03 DIAGNOSIS — C61 Malignant neoplasm of prostate: Secondary | ICD-10-CM | POA: Diagnosis not present

## 2014-10-03 NOTE — Progress Notes (Signed)
Chart note: [name] bladder filling is suboptimal today.  I spoke with him and he will try to do better this coming Monday.

## 2014-10-07 ENCOUNTER — Ambulatory Visit
Admission: RE | Admit: 2014-10-07 | Discharge: 2014-10-07 | Disposition: A | Discharge status: Home / Self Care | Payer: Medicare Other | Source: Ambulatory Visit | Attending: Radiation Oncology | Admitting: Radiation Oncology

## 2014-10-07 ENCOUNTER — Encounter: Payer: Self-pay | Admitting: *Deleted

## 2014-10-07 ENCOUNTER — Encounter: Payer: Self-pay | Admitting: Radiation Oncology

## 2014-10-07 ENCOUNTER — Telehealth: Payer: Self-pay | Admitting: *Deleted

## 2014-10-07 VITALS — BP 167/99 | HR 100 | Temp 97.5°F | Resp 12 | Wt 154.6 lb

## 2014-10-07 DIAGNOSIS — C61 Malignant neoplasm of prostate: Secondary | ICD-10-CM

## 2014-10-07 NOTE — Telephone Encounter (Signed)
Called pt in efforts trying to locate his whereabouts. Voice mail on cell phone is not set up.

## 2014-10-07 NOTE — Progress Notes (Addendum)
He is currently in no pain. Pt complains of Loss of Sleep.  Reports urinary frequency.  Pt states they urinate more than 5 times last night, but reports that is from drinking increased amounts of water. Typically reports urinating 2-3 times at night. Pt reports occasional diarrhea and constipation.
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
## 2014-10-08 ENCOUNTER — Ambulatory Visit
Admission: RE | Admit: 2014-10-08 | Discharge: 2014-10-08 | Disposition: A | Discharge status: Home / Self Care | Payer: Medicare Other | Source: Ambulatory Visit | Attending: Radiation Oncology | Admitting: Radiation Oncology

## 2014-10-08 DIAGNOSIS — C61 Malignant neoplasm of prostate: Secondary | ICD-10-CM | POA: Diagnosis not present

## 2014-10-09 ENCOUNTER — Ambulatory Visit
Admission: RE | Admit: 2014-10-09 | Discharge: 2014-10-09 | Disposition: A | Discharge status: Home / Self Care | Payer: Medicare Other | Source: Ambulatory Visit | Attending: Radiation Oncology | Admitting: Radiation Oncology

## 2014-10-09 ENCOUNTER — Encounter: Payer: Self-pay | Admitting: Radiation Oncology

## 2014-10-09 DIAGNOSIS — C61 Malignant neoplasm of prostate: Secondary | ICD-10-CM | POA: Diagnosis not present

## 2014-10-09 NOTE — Progress Notes (Signed)
[name] did not have optimal filling of his bladder today. I called him this morning to encourage him to improve his bladder filling. He told me that he did not wake up early enough to drink fluid.  I called him last week as well. RM

## 2014-10-10 ENCOUNTER — Ambulatory Visit
Admission: RE | Admit: 2014-10-10 | Discharge: 2014-10-10 | Disposition: A | Discharge status: Home / Self Care | Payer: Medicare Other | Source: Ambulatory Visit | Attending: Radiation Oncology | Admitting: Radiation Oncology

## 2014-10-10 DIAGNOSIS — C61 Malignant neoplasm of prostate: Secondary | ICD-10-CM | POA: Diagnosis not present

## 2014-10-11 ENCOUNTER — Ambulatory Visit
Admission: RE | Admit: 2014-10-11 | Discharge: 2014-10-11 | Disposition: A | Discharge status: Home / Self Care | Payer: Medicare Other | Source: Ambulatory Visit | Attending: Radiation Oncology | Admitting: Radiation Oncology

## 2014-10-11 DIAGNOSIS — C61 Malignant neoplasm of prostate: Secondary | ICD-10-CM | POA: Diagnosis not present

## 2014-10-14 ENCOUNTER — Ambulatory Visit
Admission: RE | Admit: 2014-10-14 | Discharge: 2014-10-14 | Disposition: A | Discharge status: Home / Self Care | Payer: Medicare Other | Source: Ambulatory Visit | Attending: Radiation Oncology | Admitting: Radiation Oncology

## 2014-10-14 ENCOUNTER — Encounter: Payer: Self-pay | Admitting: Radiation Oncology

## 2014-10-14 VITALS — BP 169/102 | HR 78 | Temp 97.8°F | Resp 12 | Wt 150.2 lb

## 2014-10-14 DIAGNOSIS — C61 Malignant neoplasm of prostate: Secondary | ICD-10-CM

## 2014-10-14 NOTE — Progress Notes (Signed)
He is currently in no pain. Reports urinary frequency and sweats Pain with Urination and Urgency.  Pt states they urinate 2 - 3 times per night.  Pt reports Diarrhea 1-2 times a day and Constipation, a bowel movement 7 times a day. Pt reports he takes stool softeners every other day.  He reports bouts of watery stools followed by hard bowel movements, daily.  BP 169/102 mmHg  Pulse 78  Temp(Src) 97.8 F (36.6 C) (Oral)  Resp 12  Wt 150 lb 3.2 oz (68.13 kg)  SpO2 98%

## 2014-10-14 NOTE — Progress Notes (Signed)
Weekly Management Note:  Site: Prostate  Current Dose:  3315  cGy Projected Dose: 7800  cGy  Narrative: The patient is seen today for routine under treatment assessment. CBCT/MVCT images/port films were reviewed. The chart was reviewed.   Bladder filling is less than optimal.  He has been having urinary frequency, primarily during the day.  He is unable to fill his bladder.  No new GI difficulties.  Physical Examination:  Filed Vitals:   10/14/14 0825  BP: 169/102  Pulse: 78  Temp: 97.8 F (36.6 C)  Resp: 12  .  Weight: 150 lb 3.2 oz (68.13 kg).  No change.  Impression: Tolerating radiation therapy well.  I believe he does have some bladder irritability and I would like to start him on an anti-spasmodic medication.  He was issued a new pharmacy carpal he does not have this with him.  He will bring this in tomorrow, and once we download his pharmacy into EPIC I can be prescribed this medication.  Plan: Continue radiation therapy as planned.

## 2014-10-15 ENCOUNTER — Ambulatory Visit
Admission: RE | Admit: 2014-10-15 | Discharge: 2014-10-15 | Disposition: A | Discharge status: Home / Self Care | Payer: Medicare Other | Source: Ambulatory Visit | Attending: Radiation Oncology | Admitting: Radiation Oncology

## 2014-10-15 ENCOUNTER — Encounter: Payer: Self-pay | Admitting: Radiation Oncology

## 2014-10-15 ENCOUNTER — Other Ambulatory Visit: Payer: Self-pay | Admitting: Radiation Oncology

## 2014-10-15 DIAGNOSIS — C61 Malignant neoplasm of prostate: Secondary | ICD-10-CM | POA: Diagnosis not present

## 2014-10-15 MED ORDER — OXYBUTYNIN CHLORIDE ER 10 MG PO TB24: 10.0000 mg | ORAL_TABLET | Freq: Every day | ORAL | Status: DC

## 2014-10-15 NOTE — Progress Notes (Signed)
Chart note: I e- prescribed a prescription for oxybutynin XL dispense #30 to take 1 by mouth daily (bedtime), 3 refills.

## 2014-10-16 ENCOUNTER — Ambulatory Visit
Admission: RE | Admit: 2014-10-16 | Discharge: 2014-10-16 | Disposition: A | Discharge status: Home / Self Care | Payer: Medicare Other | Source: Ambulatory Visit | Attending: Radiation Oncology | Admitting: Radiation Oncology

## 2014-10-16 DIAGNOSIS — C61 Malignant neoplasm of prostate: Secondary | ICD-10-CM | POA: Diagnosis not present

## 2014-10-17 ENCOUNTER — Ambulatory Visit
Admission: RE | Admit: 2014-10-17 | Discharge: 2014-10-17 | Disposition: A | Discharge status: Home / Self Care | Payer: Medicare Other | Source: Ambulatory Visit | Attending: Radiation Oncology | Admitting: Radiation Oncology

## 2014-10-17 DIAGNOSIS — C61 Malignant neoplasm of prostate: Secondary | ICD-10-CM | POA: Diagnosis not present

## 2014-10-18 ENCOUNTER — Ambulatory Visit
Admission: RE | Admit: 2014-10-18 | Discharge: 2014-10-18 | Disposition: A | Discharge status: Home / Self Care | Payer: Medicare Other | Source: Ambulatory Visit | Attending: Radiation Oncology | Admitting: Radiation Oncology

## 2014-10-18 DIAGNOSIS — C61 Malignant neoplasm of prostate: Secondary | ICD-10-CM | POA: Diagnosis not present

## 2014-10-21 ENCOUNTER — Encounter: Payer: Self-pay | Admitting: Radiation Oncology

## 2014-10-21 ENCOUNTER — Ambulatory Visit
Admission: RE | Admit: 2014-10-21 | Discharge: 2014-10-21 | Disposition: A | Discharge status: Home / Self Care | Payer: Medicare Other | Source: Ambulatory Visit | Attending: Radiation Oncology | Admitting: Radiation Oncology

## 2014-10-21 VITALS — BP 146/96 | HR 87 | Temp 98.0°F | Resp 12 | Wt 149.5 lb

## 2014-10-21 DIAGNOSIS — C61 Malignant neoplasm of prostate: Secondary | ICD-10-CM | POA: Diagnosis not present

## 2014-10-21 NOTE — Progress Notes (Signed)
He is currently in no pain. Pt complains of occasional hot flashes. Started Ditropan-XL 10mg  10/15/14.  Urinary symptoms have improved slightly.  Reports urinary frequency.  Pt states they urinate more than 5 times per night.  Pt reports soft bowel movements with occasional continued diarrhea, pt reports that adjusting his diet has helped.  BP 146/96 mmHg  Pulse 87  Temp(Src) 98 F (36.7 C) (Oral)  Resp 12  Wt 149 lb 8 oz (67.813 kg)  SpO2 100%

## 2014-10-21 NOTE — Progress Notes (Signed)
Weekly Management Note:  Site: Prostate/pelvic lymph nodes Current Dose:  4290  cGy Projected Dose: 7800  cGy  Narrative: The patient is seen today for routine under treatment assessment. CBCT/MVCT images/port films were reviewed. The chart was reviewed.   Bladder filling is satisfactory.  No new GU or GI difficulties.  He does have nocturia 5 which is improved with Ditropan XL.  Physical Examination:  Filed Vitals:   10/21/14 0840  BP: 146/96  Pulse: 87  Temp: 98 F (36.7 C)  Resp: 12  .  Weight: 149 lb 8 oz (67.813 kg).  No change.  Impression: Tolerating radiation therapy well.  Plan: Continue radiation therapy as planned.

## 2014-10-22 ENCOUNTER — Ambulatory Visit
Admission: RE | Admit: 2014-10-22 | Discharge: 2014-10-22 | Disposition: A | Discharge status: Home / Self Care | Payer: Medicare Other | Source: Ambulatory Visit | Attending: Radiation Oncology | Admitting: Radiation Oncology

## 2014-10-22 DIAGNOSIS — C61 Malignant neoplasm of prostate: Secondary | ICD-10-CM | POA: Diagnosis not present

## 2014-10-23 ENCOUNTER — Ambulatory Visit
Admission: RE | Admit: 2014-10-23 | Discharge: 2014-10-23 | Disposition: A | Discharge status: Home / Self Care | Payer: Medicare Other | Source: Ambulatory Visit | Attending: Radiation Oncology | Admitting: Radiation Oncology

## 2014-10-23 DIAGNOSIS — C61 Malignant neoplasm of prostate: Secondary | ICD-10-CM | POA: Diagnosis not present

## 2014-10-24 ENCOUNTER — Ambulatory Visit
Admission: RE | Admit: 2014-10-24 | Discharge: 2014-10-24 | Disposition: A | Discharge status: Home / Self Care | Payer: Medicare Other | Source: Ambulatory Visit | Attending: Radiation Oncology | Admitting: Radiation Oncology

## 2014-10-24 DIAGNOSIS — C61 Malignant neoplasm of prostate: Secondary | ICD-10-CM | POA: Diagnosis not present

## 2014-10-25 ENCOUNTER — Ambulatory Visit
Admission: RE | Admit: 2014-10-25 | Discharge: 2014-10-25 | Disposition: A | Discharge status: Home / Self Care | Payer: Medicare Other | Source: Ambulatory Visit | Attending: Radiation Oncology | Admitting: Radiation Oncology

## 2014-10-25 DIAGNOSIS — C61 Malignant neoplasm of prostate: Secondary | ICD-10-CM | POA: Diagnosis not present

## 2014-10-28 ENCOUNTER — Ambulatory Visit
Admission: RE | Admit: 2014-10-28 | Discharge: 2014-10-28 | Disposition: A | Discharge status: Home / Self Care | Payer: Medicare Other | Source: Ambulatory Visit | Attending: Radiation Oncology | Admitting: Radiation Oncology

## 2014-10-28 ENCOUNTER — Encounter: Payer: Self-pay | Admitting: Radiation Oncology

## 2014-10-28 ENCOUNTER — Ambulatory Visit: Admission: RE | Admit: 2014-10-28 | Payer: Medicare Other | Source: Ambulatory Visit

## 2014-10-28 VITALS — BP 171/91 | HR 69 | Temp 97.9°F | Resp 12 | Wt 153.4 lb

## 2014-10-28 DIAGNOSIS — C61 Malignant neoplasm of prostate: Secondary | ICD-10-CM | POA: Diagnosis not present

## 2014-10-28 NOTE — Progress Notes (Signed)
He is currently in no pain. Pt reports urinary frequency, urgency, hesistency, pain with urination and hot flashes. Pt states they urinate 2 - 3 times per night.  Pt reports Diarrhea -4 times a day and Constipation.  BP 171/91 mmHg  Pulse 69  Temp(Src) 97.9 F (36.6 C) (Oral)  Resp 12  Wt 153 lb 6.4 oz (69.582 kg)  SpO2 97%

## 2014-10-28 NOTE — Progress Notes (Signed)
Weekly Management Note:  Site: Prostate Current Dose:  5070  cGy Projected Dose: 7800  cGy  Narrative: The patient is seen today for routine under treatment assessment. CBCT/MVCT images/port films were reviewed. The chart was reviewed.   The machine was down today and he was not treated.  We will resume tomorrow.  No new GU or GI difficulties.  His bladder filling has been improved over the past week.  Physical Examination:  Filed Vitals:   10/28/14 0835  BP: 171/91  Pulse: 69  Temp: 97.9 F (36.6 C)  Resp: 12  .  Weight: 153 lb 6.4 oz (69.582 kg).  No change.  Impression: Tolerating radiation therapy well.  Plan: Continue radiation therapy as planned.

## 2014-10-29 ENCOUNTER — Ambulatory Visit
Admission: RE | Admit: 2014-10-29 | Discharge: 2014-10-29 | Disposition: A | Discharge status: Home / Self Care | Payer: Medicare Other | Source: Ambulatory Visit | Attending: Radiation Oncology | Admitting: Radiation Oncology

## 2014-10-29 DIAGNOSIS — C61 Malignant neoplasm of prostate: Secondary | ICD-10-CM | POA: Diagnosis not present

## 2014-10-30 ENCOUNTER — Ambulatory Visit
Admission: RE | Admit: 2014-10-30 | Discharge: 2014-10-30 | Disposition: A | Discharge status: Home / Self Care | Payer: Medicare Other | Source: Ambulatory Visit | Attending: Radiation Oncology | Admitting: Radiation Oncology

## 2014-10-30 DIAGNOSIS — C61 Malignant neoplasm of prostate: Secondary | ICD-10-CM | POA: Diagnosis not present

## 2014-10-31 ENCOUNTER — Ambulatory Visit
Admission: RE | Admit: 2014-10-31 | Discharge: 2014-10-31 | Disposition: A | Discharge status: Home / Self Care | Payer: Medicare Other | Source: Ambulatory Visit | Attending: Radiation Oncology | Admitting: Radiation Oncology

## 2014-10-31 DIAGNOSIS — C61 Malignant neoplasm of prostate: Secondary | ICD-10-CM | POA: Diagnosis not present

## 2014-11-01 ENCOUNTER — Ambulatory Visit
Admission: RE | Admit: 2014-11-01 | Discharge: 2014-11-01 | Disposition: A | Discharge status: Home / Self Care | Payer: Medicare Other | Source: Ambulatory Visit | Attending: Radiation Oncology | Admitting: Radiation Oncology

## 2014-11-01 DIAGNOSIS — C61 Malignant neoplasm of prostate: Secondary | ICD-10-CM | POA: Diagnosis not present

## 2014-11-04 ENCOUNTER — Encounter: Payer: Self-pay | Admitting: Radiation Oncology

## 2014-11-04 ENCOUNTER — Ambulatory Visit
Admission: RE | Admit: 2014-11-04 | Discharge: 2014-11-04 | Disposition: A | Discharge status: Home / Self Care | Payer: Medicare Other | Source: Ambulatory Visit | Attending: Radiation Oncology | Admitting: Radiation Oncology

## 2014-11-04 VITALS — BP 156/98 | HR 77 | Temp 98.1°F | Resp 12 | Wt 154.2 lb

## 2014-11-04 DIAGNOSIS — C61 Malignant neoplasm of prostate: Secondary | ICD-10-CM

## 2014-11-04 NOTE — Progress Notes (Signed)
He is currently in no pain.  Pt reports urinary frequency and hot flashes. Pt states they urinate 3 - 4 times per night.  Pt reports Diarrhea -2 times this morning, reports not taking any medications to manage.    BP 156/98 mmHg  Pulse 77  Temp(Src) 98.1 F (36.7 C) (Oral)  Resp 12  Wt 154 lb 3.2 oz (69.945 kg)  SpO2 100%

## 2014-11-04 NOTE — Progress Notes (Signed)
Weekly Management Note:  Site: Prostate/pelvic lymph nodes Current Dose:  6045  cGy Projected Dose: 7800  cGy  Narrative: The patient is seen today for routine under treatment assessment. CBCT/MVCT images/port films were reviewed. The chart was reviewed.   Bladder filling is satisfactory.  He did have some loosening of his bowels this morning.  He is taking a stool softener.  Physical Examination:  Filed Vitals:   11/04/14 0835  BP: 156/98  Pulse: 77  Temp: 98.1 F (36.7 C)  Resp: 12  .  Weight: 154 lb 3.2 oz (69.945 kg).  No change.  Impression: Tolerating radiation therapy well.  Plan: Continue radiation therapy as planned.

## 2014-11-05 ENCOUNTER — Ambulatory Visit
Admission: RE | Admit: 2014-11-05 | Discharge: 2014-11-05 | Disposition: A | Discharge status: Home / Self Care | Payer: Medicare Other | Source: Ambulatory Visit | Attending: Radiation Oncology | Admitting: Radiation Oncology

## 2014-11-05 ENCOUNTER — Other Ambulatory Visit: Payer: Self-pay | Admitting: *Deleted

## 2014-11-05 DIAGNOSIS — K2980 Duodenitis without bleeding: Secondary | ICD-10-CM

## 2014-11-05 DIAGNOSIS — C61 Malignant neoplasm of prostate: Secondary | ICD-10-CM | POA: Diagnosis not present

## 2014-11-06 ENCOUNTER — Ambulatory Visit
Admission: RE | Admit: 2014-11-06 | Discharge: 2014-11-06 | Disposition: A | Discharge status: Home / Self Care | Payer: Medicare Other | Source: Ambulatory Visit | Attending: Radiation Oncology | Admitting: Radiation Oncology

## 2014-11-06 ENCOUNTER — Other Ambulatory Visit: Payer: Self-pay | Admitting: *Deleted

## 2014-11-06 DIAGNOSIS — C61 Malignant neoplasm of prostate: Secondary | ICD-10-CM | POA: Diagnosis not present

## 2014-11-06 NOTE — Telephone Encounter (Signed)
Not pt in imc

## 2014-11-07 ENCOUNTER — Ambulatory Visit
Admission: RE | Admit: 2014-11-07 | Discharge: 2014-11-07 | Disposition: A | Discharge status: Home / Self Care | Payer: Medicare Other | Source: Ambulatory Visit | Attending: Radiation Oncology | Admitting: Radiation Oncology

## 2014-11-07 DIAGNOSIS — C61 Malignant neoplasm of prostate: Secondary | ICD-10-CM | POA: Diagnosis not present

## 2014-11-08 ENCOUNTER — Ambulatory Visit
Admission: RE | Admit: 2014-11-08 | Discharge: 2014-11-08 | Disposition: A | Discharge status: Home / Self Care | Payer: Medicare Other | Source: Ambulatory Visit | Attending: Radiation Oncology | Admitting: Radiation Oncology

## 2014-11-08 ENCOUNTER — Other Ambulatory Visit: Payer: Self-pay | Admitting: *Deleted

## 2014-11-08 DIAGNOSIS — K2980 Duodenitis without bleeding: Secondary | ICD-10-CM

## 2014-11-08 DIAGNOSIS — C61 Malignant neoplasm of prostate: Secondary | ICD-10-CM | POA: Diagnosis not present

## 2014-11-08 MED ORDER — PANTOPRAZOLE SODIUM 40 MG PO TBEC: 40.0000 mg | DELAYED_RELEASE_TABLET | Freq: Every day | ORAL | Status: DC

## 2014-11-11 ENCOUNTER — Encounter: Payer: Self-pay | Admitting: Radiation Oncology

## 2014-11-11 ENCOUNTER — Ambulatory Visit
Admission: RE | Admit: 2014-11-11 | Discharge: 2014-11-11 | Disposition: A | Discharge status: Home / Self Care | Payer: Medicare Other | Source: Ambulatory Visit | Attending: Radiation Oncology | Admitting: Radiation Oncology

## 2014-11-11 VITALS — BP 166/109 | HR 82 | Temp 97.9°F | Resp 20 | Wt 151.8 lb

## 2014-11-11 DIAGNOSIS — C61 Malignant neoplasm of prostate: Secondary | ICD-10-CM | POA: Diagnosis not present

## 2014-11-11 NOTE — Progress Notes (Addendum)
Weekly rad tx s prostate 36/40 completed, nocturia x3, blood on tissue at times , soft stools, no hematuria,, only when he doesn't drink enough water has slight dysuria,  appetite good, ditropan helping stated, no fatigue 8:36 AM

## 2014-11-11 NOTE — Progress Notes (Signed)
Weekly Management Note:  Site: Prostate/pelvic lymph nodes Current Dose:  7020  cGy Projected Dose: 7800  cGy  Narrative: The patient is seen today for routine under treatment assessment. CBCT/MVCT images/port films were reviewed. The chart was reviewed.   Bladder filling is satisfactory.  No new GU or GI difficulties.  He will finish his treatment this Friday.  Physical Examination:  Filed Vitals:   11/11/14 0833  BP: 166/109  Pulse: 82  Temp: 97.9 F (36.6 C)  Resp: 20  .  Weight: 151 lb 12.8 oz (68.856 kg).  No change.  Impression: Tolerating radiation therapy well.  Plan: Continue radiation therapy as planned.  One-month follow-up after completion of radiation therapy this Friday.

## 2014-11-12 ENCOUNTER — Ambulatory Visit
Admission: RE | Admit: 2014-11-12 | Discharge: 2014-11-12 | Disposition: A | Discharge status: Home / Self Care | Payer: Medicare Other | Source: Ambulatory Visit | Attending: Radiation Oncology | Admitting: Radiation Oncology

## 2014-11-12 DIAGNOSIS — C61 Malignant neoplasm of prostate: Secondary | ICD-10-CM | POA: Diagnosis not present

## 2014-11-13 ENCOUNTER — Ambulatory Visit
Admission: RE | Admit: 2014-11-13 | Discharge: 2014-11-13 | Disposition: A | Discharge status: Home / Self Care | Payer: Medicare Other | Source: Ambulatory Visit | Attending: Radiation Oncology | Admitting: Radiation Oncology

## 2014-11-13 DIAGNOSIS — C61 Malignant neoplasm of prostate: Secondary | ICD-10-CM | POA: Diagnosis not present

## 2014-11-14 ENCOUNTER — Ambulatory Visit
Admission: RE | Admit: 2014-11-14 | Discharge: 2014-11-14 | Disposition: A | Discharge status: Home / Self Care | Payer: Medicare Other | Source: Ambulatory Visit | Attending: Radiation Oncology | Admitting: Radiation Oncology

## 2014-11-14 DIAGNOSIS — C61 Malignant neoplasm of prostate: Secondary | ICD-10-CM | POA: Diagnosis not present

## 2014-11-15 ENCOUNTER — Ambulatory Visit
Admission: RE | Admit: 2014-11-15 | Discharge: 2014-11-15 | Disposition: A | Discharge status: Home / Self Care | Payer: Medicare Other | Source: Ambulatory Visit | Attending: Radiation Oncology | Admitting: Radiation Oncology

## 2014-11-15 DIAGNOSIS — C61 Malignant neoplasm of prostate: Secondary | ICD-10-CM | POA: Diagnosis not present

## 2014-11-18 ENCOUNTER — Encounter: Payer: Self-pay | Admitting: Radiation Oncology

## 2014-11-18 NOTE — Progress Notes (Signed)
[facility] End of Treatment Note  Name:William Jones  Date: 11/18/2014 YOM:[id number] DOB:1951-03-26   Status:outpatient    CC: William Sax, MD  Dr. Rana Snare  REFERRING PHYSICIAN:  Dr. Rana Snare   DIAGNOSIS: Clinical stage T3a N0  high risk adenocarcinoma prostate  INDICATION FOR TREATMENT: Curative   TREATMENT DATES: 09/18/2014 through 11/15/2014                          SITE/DOSE:  Prostate 7800 cGy in 40 sessions, seminal vesicles, and pelvic lymph nodes 5600 cGy in 40 sessions                          BEAMS/ENERGY:   Dual ARC VMAT IMRT with 6 MV photons                NARRATIVE:  [name] tolerated treatment reasonably well although he had worsening urinary frequency and urgency during his course of therapy for which I prescribed oxybutynin XL.  He difficulty maintaining a comfortably full bladder during his course of therapy leading to his urinary urgency/frequency.  He is also undergoing androgen deprivation therapy which is reasonably well tolerated.                          PLAN: Routine followup in one month. Patient instructed to call if questions or worsening complaints in interim.

## 2014-12-02 ENCOUNTER — Ambulatory Visit: Payer: Medicare Other | Admitting: Podiatry

## 2014-12-06 ENCOUNTER — Emergency Department (HOSPITAL_COMMUNITY): Payer: Medicare Other

## 2014-12-06 ENCOUNTER — Emergency Department (HOSPITAL_COMMUNITY)
Admission: EM | Admit: 2014-12-06 | Discharge: 2014-12-07 | Disposition: A | Discharge status: Home / Self Care | Payer: Medicare Other | Attending: Emergency Medicine | Admitting: Emergency Medicine

## 2014-12-06 ENCOUNTER — Encounter (HOSPITAL_COMMUNITY): Payer: Self-pay | Admitting: Emergency Medicine

## 2014-12-06 DIAGNOSIS — S199XXA Unspecified injury of neck, initial encounter: Secondary | ICD-10-CM | POA: Insufficient documentation

## 2014-12-06 DIAGNOSIS — D509 Iron deficiency anemia, unspecified: Secondary | ICD-10-CM | POA: Diagnosis not present

## 2014-12-06 DIAGNOSIS — F10129 Alcohol abuse with intoxication, unspecified: Secondary | ICD-10-CM | POA: Diagnosis not present

## 2014-12-06 DIAGNOSIS — G629 Polyneuropathy, unspecified: Secondary | ICD-10-CM | POA: Diagnosis not present

## 2014-12-06 DIAGNOSIS — Z789 Other specified health status: Secondary | ICD-10-CM

## 2014-12-06 DIAGNOSIS — R000 Tachycardia, unspecified: Secondary | ICD-10-CM | POA: Diagnosis not present

## 2014-12-06 DIAGNOSIS — M542 Cervicalgia: Secondary | ICD-10-CM | POA: Diagnosis not present

## 2014-12-06 DIAGNOSIS — Y92410 Unspecified street and highway as the place of occurrence of the external cause: Secondary | ICD-10-CM | POA: Insufficient documentation

## 2014-12-06 DIAGNOSIS — S3993XA Unspecified injury of pelvis, initial encounter: Secondary | ICD-10-CM | POA: Diagnosis not present

## 2014-12-06 DIAGNOSIS — Z7289 Other problems related to lifestyle: Secondary | ICD-10-CM

## 2014-12-06 DIAGNOSIS — S0990XA Unspecified injury of head, initial encounter: Secondary | ICD-10-CM | POA: Diagnosis not present

## 2014-12-06 DIAGNOSIS — Y999 Unspecified external cause status: Secondary | ICD-10-CM | POA: Diagnosis not present

## 2014-12-06 DIAGNOSIS — Y939 Activity, unspecified: Secondary | ICD-10-CM | POA: Insufficient documentation

## 2014-12-06 DIAGNOSIS — Z87891 Personal history of nicotine dependence: Secondary | ICD-10-CM | POA: Insufficient documentation

## 2014-12-06 DIAGNOSIS — T148 Other injury of unspecified body region: Secondary | ICD-10-CM | POA: Diagnosis not present

## 2014-12-06 DIAGNOSIS — K2980 Duodenitis without bleeding: Secondary | ICD-10-CM | POA: Insufficient documentation

## 2014-12-06 DIAGNOSIS — Z8546 Personal history of malignant neoplasm of prostate: Secondary | ICD-10-CM | POA: Diagnosis not present

## 2014-12-06 DIAGNOSIS — Z79899 Other long term (current) drug therapy: Secondary | ICD-10-CM | POA: Diagnosis not present

## 2014-12-06 DIAGNOSIS — J90 Pleural effusion, not elsewhere classified: Secondary | ICD-10-CM | POA: Diagnosis not present

## 2014-12-06 LAB — COMPREHENSIVE METABOLIC PANEL
ALBUMIN: 4.1 g/dL (ref 3.5–5.2)
ALT: 33 U/L (ref 0–53)
AST: 30 U/L (ref 0–37)
Alkaline Phosphatase: 59 U/L (ref 39–117)
Anion gap: 11 (ref 5–15)
BILIRUBIN TOTAL: 0.4 mg/dL (ref 0.3–1.2)
BUN: 16 mg/dL (ref 6–23)
CO2: 23 mmol/L (ref 19–32)
CREATININE: 0.8 mg/dL (ref 0.50–1.35)
Calcium: 9.7 mg/dL (ref 8.4–10.5)
Chloride: 108 mmol/L (ref 96–112)
GFR calc Af Amer: >90 mL/min (ref >90)
GFR calc non Af Amer: >90 mL/min (ref >90)
Glucose, Bld: 127 mg/dL — ABNORMAL HIGH (ref 70–99)
Potassium: 3.6 mmol/L (ref 3.5–5.1)
Sodium: 142 mmol/L (ref 135–145)
Total Protein: 8.3 g/dL (ref 6.0–8.3)

## 2014-12-06 LAB — I-STAT CG4 LACTIC ACID, ED: LACTIC ACID, VENOUS: 2.64 mmol/L — AB (ref 0.5–2.0)

## 2014-12-06 LAB — I-STAT CHEM 8, ED
BUN: 18 mg/dL (ref 6–23)
CREATININE: 1.1 mg/dL (ref 0.50–1.35)
Calcium, Ion: 1.24 mmol/L (ref 1.13–1.30)
Chloride: 107 mmol/L (ref 96–112)
Glucose, Bld: 127 mg/dL — ABNORMAL HIGH (ref 70–99)
HCT: 38 % — ABNORMAL LOW (ref 39.0–52.0)
Hemoglobin: 12.9 g/dL — ABNORMAL LOW (ref 13.0–17.0)
Potassium: 3.6 mmol/L (ref 3.5–5.1)
SODIUM: 144 mmol/L (ref 135–145)
TCO2: 22 mmol/L (ref 0–100)

## 2014-12-06 LAB — CBC WITH DIFFERENTIAL/PLATELET
BASOS ABS: 0 10*3/uL (ref 0.0–0.1)
BASOS PCT: 1 % (ref 0–1)
Eosinophils Absolute: 0.2 10*3/uL (ref 0.0–0.7)
Eosinophils Relative: 5 % (ref 0–5)
HEMATOCRIT: 33.2 % — AB (ref 39.0–52.0)
Hemoglobin: 10.5 g/dL — ABNORMAL LOW (ref 13.0–17.0)
LYMPHS PCT: 39 % (ref 12–46)
Lymphs Abs: 1.3 10*3/uL (ref 0.7–4.0)
MCH: 28.4 pg (ref 26.0–34.0)
MCHC: 31.6 g/dL (ref 30.0–36.0)
MCV: 89.7 fL (ref 78.0–100.0)
MONO ABS: 0.5 10*3/uL (ref 0.1–1.0)
Monocytes Relative: 15 % — ABNORMAL HIGH (ref 3–12)
Neutro Abs: 1.3 10*3/uL — ABNORMAL LOW (ref 1.7–7.7)
Neutrophils Relative %: 40 % — ABNORMAL LOW (ref 43–77)
Platelets: 389 10*3/uL (ref 150–400)
RBC: 3.7 MIL/uL — ABNORMAL LOW (ref 4.22–5.81)
RDW: 14.6 % (ref 11.5–15.5)
WBC: 3.3 10*3/uL — ABNORMAL LOW (ref 4.0–10.5)

## 2014-12-06 LAB — URINALYSIS, ROUTINE W REFLEX MICROSCOPIC
Bilirubin Urine: NEGATIVE
Glucose, UA: NEGATIVE mg/dL
HGB URINE DIPSTICK: NEGATIVE
Ketones, ur: NEGATIVE mg/dL
LEUKOCYTES UA: NEGATIVE
NITRITE: NEGATIVE
PROTEIN: NEGATIVE mg/dL
SPECIFIC GRAVITY, URINE: 1.014 (ref 1.005–1.030)
Urobilinogen, UA: 1 mg/dL (ref 0.0–1.0)
pH: 5.5 (ref 5.0–8.0)

## 2014-12-06 LAB — ETHANOL: ALCOHOL ETHYL (B): 254 mg/dL — AB (ref 0–9)

## 2014-12-06 LAB — PROTIME-INR
INR: 0.97 (ref 0.00–1.49)
Prothrombin Time: 13 seconds (ref 11.6–15.2)

## 2014-12-06 LAB — LIPASE, BLOOD: LIPASE: 45 U/L (ref 11–59)

## 2014-12-06 LAB — SAMPLE TO BLOOD BANK

## 2014-12-06 MED ORDER — SODIUM CHLORIDE 0.9 % IV BOLUS (SEPSIS)
1000.0000 mL | Freq: Once | INTRAVENOUS | Status: AC
Start: 2014-12-06 — End: 2014-12-07
  Administered 2014-12-06: 1000 mL via INTRAVENOUS

## 2014-12-06 MED ORDER — DIAZEPAM 5 MG/ML IJ SOLN
5.0000 mg | Freq: Once | INTRAMUSCULAR | Status: AC
  Administered 2014-12-06: 5 mg via INTRAVENOUS

## 2014-12-06 MED ORDER — DIAZEPAM 5 MG/ML IJ SOLN
INTRAMUSCULAR | Status: AC
  Filled 2014-12-06: qty 2

## 2014-12-06 MED ORDER — DIPHENHYDRAMINE HCL 50 MG/ML IJ SOLN: 50.0000 mg | Freq: Once | INTRAMUSCULAR | Status: DC
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
## 2014-12-06 NOTE — ED Notes (Signed)
i-stat lactic acid result shown to Dr. Claudine Mouton

## 2014-12-07 DIAGNOSIS — S0990XA Unspecified injury of head, initial encounter: Secondary | ICD-10-CM | POA: Diagnosis not present

## 2014-12-07 DIAGNOSIS — M542 Cervicalgia: Secondary | ICD-10-CM | POA: Diagnosis not present

## 2014-12-07 DIAGNOSIS — S199XXA Unspecified injury of neck, initial encounter: Secondary | ICD-10-CM | POA: Diagnosis not present

## 2014-12-07 LAB — RAPID URINE DRUG SCREEN, HOSP PERFORMED
Amphetamines: NOT DETECTED
Barbiturates: NOT DETECTED
Benzodiazepines: NOT DETECTED
Cocaine: NOT DETECTED
OPIATES: NOT DETECTED
TETRAHYDROCANNABINOL: NOT DETECTED
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
## 2014-12-07 NOTE — ED Notes (Signed)
Pt ambulated before discharge again per MD. Pt refused to take paperwork at time of discharge. Pt threw the paperwork at this RN.

## 2014-12-07 NOTE — ED Notes (Signed)
Pt ambulated without difficulties, MD informed.

## 2014-12-10 ENCOUNTER — Ambulatory Visit (INDEPENDENT_AMBULATORY_CARE_PROVIDER_SITE_OTHER): Payer: Medicare Other

## 2014-12-10 VITALS — BP 159/98 | HR 84 | Resp 12

## 2014-12-10 DIAGNOSIS — M792 Neuralgia and neuritis, unspecified: Secondary | ICD-10-CM

## 2014-12-10 DIAGNOSIS — M79676 Pain in unspecified toe(s): Secondary | ICD-10-CM

## 2014-12-10 DIAGNOSIS — Q828 Other specified congenital malformations of skin: Secondary | ICD-10-CM | POA: Diagnosis not present

## 2014-12-10 DIAGNOSIS — G629 Polyneuropathy, unspecified: Secondary | ICD-10-CM | POA: Diagnosis not present

## 2014-12-10 DIAGNOSIS — B351 Tinea unguium: Secondary | ICD-10-CM | POA: Diagnosis not present

## 2014-12-10 NOTE — Progress Notes (Signed)
   Subjective:    Patient ID: William Jones, male    DOB: 10-22-1950, 64 y.o.   MRN: [id number]  HPI TOENAILS AND CALLUS TRIM.   Review of Systems no new findings or systemic changes noted     Objective:   Physical Exam Neurovascular status appears to be intact pedal pulses are diminished MTP and PT one over 4 bilateral Refill time 3 seconds all digits epicritic and proprioceptive sensations grossly diminished on Semmes Weinstein patient does have anemia history peripheral neuropathy also chemotherapy associated neuropathy. Clavus second left with hemorrhage a keratoses no open wound or ulcer there is keratoses of the fifth MTP right and pinch callus of the hallux left. Brittle crumbly friable 1 through 5 right 13 and 4 left the fifth left having los this toenail to frostbite.no open wounds no infections at this time patient does have abnormal sensation confirmed on Semmes Weinstein to the forefoot and digits is have some, history of complications.       Assessment & Plan:  Assessment peripheral neuropathy possible mild angiopathy as well painful mycotic nails debrided 1 through 5 on the right 1 through 4 on the left. Brittle dystrophic friable mycotic nails debrided also keratoses second left and pinch callus first left and sub-5 right are debrided and presence of pain symptomology as well as peripheral neuropathy return for future palliative care every 3 months as recommended  Harriet Masson DPM

## 2014-12-16 ENCOUNTER — Encounter: Payer: Self-pay | Admitting: Radiation Oncology

## 2014-12-18 ENCOUNTER — Encounter: Payer: Self-pay | Admitting: Radiation Oncology

## 2014-12-18 ENCOUNTER — Ambulatory Visit
Admission: RE | Admit: 2014-12-18 | Discharge: 2014-12-18 | Disposition: A | Discharge status: Home / Self Care | Payer: Medicare Other | Source: Ambulatory Visit | Attending: Radiation Oncology | Admitting: Radiation Oncology

## 2014-12-18 VITALS — BP 145/99 | HR 86 | Temp 98.0°F | Resp 16 | Wt 153.8 lb

## 2014-12-18 DIAGNOSIS — C61 Malignant neoplasm of prostate: Secondary | ICD-10-CM

## 2014-12-18 HISTORY — DX: Personal history of irradiation: Z92.3

## 2014-12-18 NOTE — Progress Notes (Signed)
CC: Dr. Rana Snare  Follow-up note:  William Jones visits today approximately 5 weeks following completion of external beam/IMRT to his prostate and pelvic lymph nodes in the management of his clinical stageT3a high-risk adenocarcinoma prostate.  He continues to have intermittent dysuria which improves with oxybutynin XL.  He is otherwise doing well.  No GI difficulties.  He tells me that he is still able to have erections and  his libido is good.  He tells me that he only needs some Viagra.  He will visit Dr. Risa Grill in early April for continuation of his androgen deprivation therapy.   Physical examination: Alert and oriented. Filed Vitals:   12/18/14 0904  BP: 145/99  Pulse: 86  Temp: 98 F (36.7 C)  Resp: 16   Rectal examination: The prostate gland is smaller and I no longer appreciate residual induration  that was previously noted along the right lateral sulcus.  Impression: Satisfactory progress although he does have residual irritative symptoms which are improved with oxybutynin XL.  I told him that we would like for him to receive a full 2 years of androgen deprivation therapy.  Of note is that he is able to have erections and his libido is essentially unchanged.  Plan: Follow-up with Dr. Risa Grill with continuation of androgen deprivation therapy.  I've not scheduled [name] for a formal follow-up visit and I ask that Dr. Risa Grill keep me posted on his progress. William Jones

## 2014-12-18 NOTE — Progress Notes (Signed)
Reports occasional mild dysuria. Reports frequency and urgency have improved. Reports nocturia x 3. Reports his energy level has returned to normal. Continues oxybutynin XL. Scheduled to follow up with Dr. Risa Grill on 3/29. Denies pain. Weight and vitals stable.

## 2014-12-31 DIAGNOSIS — C61 Malignant neoplasm of prostate: Secondary | ICD-10-CM | POA: Diagnosis not present

## 2015-01-13 ENCOUNTER — Other Ambulatory Visit: Payer: Self-pay | Admitting: Internal Medicine

## 2015-01-28 ENCOUNTER — Other Ambulatory Visit: Payer: Self-pay | Admitting: Internal Medicine

## 2015-02-24 DIAGNOSIS — C61 Malignant neoplasm of prostate: Secondary | ICD-10-CM | POA: Diagnosis not present

## 2015-03-04 ENCOUNTER — Other Ambulatory Visit: Payer: Self-pay | Admitting: Internal Medicine

## 2015-03-18 ENCOUNTER — Ambulatory Visit (INDEPENDENT_AMBULATORY_CARE_PROVIDER_SITE_OTHER): Payer: Medicare Other | Admitting: Podiatry

## 2015-03-18 DIAGNOSIS — B351 Tinea unguium: Secondary | ICD-10-CM | POA: Diagnosis not present

## 2015-03-18 DIAGNOSIS — M79676 Pain in unspecified toe(s): Secondary | ICD-10-CM | POA: Diagnosis not present

## 2015-03-18 DIAGNOSIS — E1140 Type 2 diabetes mellitus with diabetic neuropathy, unspecified: Secondary | ICD-10-CM

## 2015-03-18 DIAGNOSIS — G629 Polyneuropathy, unspecified: Secondary | ICD-10-CM

## 2015-03-18 NOTE — Progress Notes (Deleted)
HPI Presents today chief complaint of painful elongated toenails.  Objective: Pulses are palpable bilateral nails are thick, yellow dystrophic onychomycosis and painful palpation.   Assessment: Onychomycosis with pain in limb.  Plan: Treatment of nails in thickness and length as covered service secondary to pain.  

## 2015-03-18 NOTE — Progress Notes (Signed)
Patient ID: William Jones, male   DOB: 1951-03-14, 64 y.o.   MRN: [id number]  Subjective: This patient presents for scheduled visit for debridement of skin and nails Patient has history of peripheral neuropathy and possible angiopathy as had previous debridement  Objective: Toenails are elongated, brittle, hypertrophic and tender to direct palpation Keratoses medial left hallux with associated HAV deformity  Assessment: Symptomatic onychomycoses 6-10 Keratoses left hallux Peripheral neuropathy medication induced  Plan: Debrided nails 10 and keratoses 1 without any bleeding  Reappoint 3 months

## 2015-05-21 ENCOUNTER — Encounter: Payer: Self-pay | Admitting: Podiatry

## 2015-05-21 ENCOUNTER — Ambulatory Visit (INDEPENDENT_AMBULATORY_CARE_PROVIDER_SITE_OTHER): Payer: Medicare Other | Admitting: Podiatry

## 2015-05-21 DIAGNOSIS — B351 Tinea unguium: Secondary | ICD-10-CM

## 2015-05-21 DIAGNOSIS — Q828 Other specified congenital malformations of skin: Secondary | ICD-10-CM | POA: Diagnosis not present

## 2015-05-21 DIAGNOSIS — G629 Polyneuropathy, unspecified: Secondary | ICD-10-CM

## 2015-05-21 DIAGNOSIS — M79673 Pain in unspecified foot: Secondary | ICD-10-CM

## 2015-05-22 NOTE — Progress Notes (Signed)
Patient ID: William Jones, male   DOB: 1950/12/19, 64 y.o.   MRN: [id number]  Subjective: This patient presents again today complaining of painful toenails on the right and left feet and a painful callus.  Objective: The toenails are hypertrophic, elongated, brittle and tender to direct palpation Keratoses medial left hallux with associated HAV deformity   Assessment: Symptomatic onychomycoses 6-10 Keratoses 1 History of peripheral neuropathy and possible angiopathy  Plan: Debridement of toenails 10 mechanically and electrically without any bleeding Debrided keratoses 1 without any bleeding  Reappoint 3 months
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
## 2015-06-24 DIAGNOSIS — C61 Malignant neoplasm of prostate: Secondary | ICD-10-CM | POA: Diagnosis not present

## 2015-06-30 DIAGNOSIS — C61 Malignant neoplasm of prostate: Secondary | ICD-10-CM | POA: Diagnosis not present

## 2015-06-30 DIAGNOSIS — N5201 Erectile dysfunction due to arterial insufficiency: Secondary | ICD-10-CM | POA: Diagnosis not present

## 2015-07-16 ENCOUNTER — Ambulatory Visit (INDEPENDENT_AMBULATORY_CARE_PROVIDER_SITE_OTHER): Payer: Medicare Other | Admitting: Internal Medicine

## 2015-07-16 ENCOUNTER — Encounter: Payer: Self-pay | Admitting: Internal Medicine

## 2015-07-16 VITALS — BP 177/112 | HR 66 | Temp 98.0°F | Ht 69.0 in | Wt 162.0 lb

## 2015-07-16 DIAGNOSIS — K921 Melena: Secondary | ICD-10-CM | POA: Insufficient documentation

## 2015-07-16 DIAGNOSIS — C61 Malignant neoplasm of prostate: Secondary | ICD-10-CM | POA: Diagnosis not present

## 2015-07-16 DIAGNOSIS — I10 Essential (primary) hypertension: Secondary | ICD-10-CM | POA: Diagnosis not present

## 2015-07-16 DIAGNOSIS — F1020 Alcohol dependence, uncomplicated: Secondary | ICD-10-CM

## 2015-07-16 DIAGNOSIS — D509 Iron deficiency anemia, unspecified: Secondary | ICD-10-CM

## 2015-07-16 DIAGNOSIS — F1011 Alcohol abuse, in remission: Secondary | ICD-10-CM

## 2015-07-16 HISTORY — DX: Essential (primary) hypertension: I10

## 2015-07-16 LAB — POC HEMOCCULT BLD/STL (OFFICE/1-CARD/DIAGNOSTIC): Fecal Occult Blood, POC: POSITIVE — AB

## 2015-07-16 MED ORDER — AMLODIPINE BESYLATE 5 MG PO TABS: 5.0000 mg | ORAL_TABLET | Freq: Every day | ORAL | Status: DC

## 2015-07-16 NOTE — Addendum Note (Signed)
Addended by: Riccardo Dubin on: 07/16/2015 06:11 PM   Modules accepted: Miquel Dunn

## 2015-07-16 NOTE — Assessment & Plan Note (Addendum)
On/off for the last year. Reports bright red blood intermixed with dark stool but denies dizziness, pre-syncope, abdominal pain, nausea, vomiting, changes in energy or activity, NSAID use. Taking Fe therapy. Hb 12.9, Hct 38 in March but dropped to Hb 11.1, Hct 35.1 at follow-up with urology in September [document in [place]. Underwent colonoscopy, EGD, capsule endoscopy in 2015 without any focal findings except questionable duodenal AVM.   FOBT+ in office without any external hemorrhoids on exam. Will refer to GI to see if he needs repeat workup. Given return precautions to the ED if symptoms should worsen as well.

## 2015-07-16 NOTE — Assessment & Plan Note (Signed)
Received Leupron shot last month at [facility]. Follows up with survivorship group at the [facility].

## 2015-07-16 NOTE — Assessment & Plan Note (Signed)
No longer drinking daily but reports having drank 2 beers just this week. Denies any morning tremors or needing to drink in the morning to start the day.

## 2015-07-16 NOTE — Assessment & Plan Note (Signed)
BP 172/104 initially and 177/112 on repeat. No prior history of BP medications. Started amlodipine 5mg  today with plan to follow-up in 1 week for reassessment. Deferred starting diuretic or ACEi/ARB in the setting of ongoing GI bleed.

## 2015-07-16 NOTE — Addendum Note (Signed)
Addended by: Riccardo Dubin on: 07/16/2015 05:34 PM   Modules accepted: Orders, SmartSet

## 2015-07-16 NOTE — Assessment & Plan Note (Signed)
Currently taking Fe sulfate 325 TID.

## 2015-07-16 NOTE — Patient Instructions (Signed)
For your bleeding, we will work on getting you over to be seen by a GI doctor.   Please come back next week to see how you are doing with your blood pressure.  Go to the ED if bleeding worsens, you develop severe abdominal pain, dizziness, pass out.

## 2015-07-16 NOTE — Progress Notes (Signed)
   Subjective:    Patient ID: William Jones, male    DOB: [birth year], 64 y.o.   MRN: [id number]  HPI Mr. Erdahl is a 64 year old male with h/o Stage 2A prostate cancer, polysubstance abuse, iron deficiency anemia  who presents today for follow-up visit. Please see assessment & plan for documentation of chronic medical problems.    Review of Systems  Constitutional: Negative for activity change, appetite change and fatigue.  Respiratory: Negative for shortness of breath.   Cardiovascular: Negative for chest pain and leg swelling.  Gastrointestinal: Positive for anal bleeding. Negative for nausea, vomiting, abdominal pain, diarrhea, constipation and blood in stool.  Genitourinary: Negative for hematuria.  Skin: Negative for pallor.  Neurological: Negative for dizziness and syncope.       Objective:   Physical Exam Constitutional: Thin, elderly African American male. No distress.  Head: Normocephalic and atraumatic.  Eyes: Conjunctivae are normal. Pupils are 4 mm, direct, consensual, near.  Cardiovascular: Normal rate, regular rhythm and normal heart sounds.  No gallop, friction rub, murmur heard. Pulmonary/Chest: Effort normal. Diminished breath sounds bilaterally. No respiratory distress. No wheezes, rales.  Abdominal: Soft. Bowel sounds are normal. No distension. No tenderness. FOBT+. No external hemorrhoids visualized.  Neurological: Alert and oriented to person, place, and time.  Coordination normal.  Skin: Warm and dry. Not diaphoretic.  Psychiatric: Affect mobile and congruent with thought content     Assessment & Plan:

## 2015-07-16 NOTE — Addendum Note (Signed)
Addended by: Riccardo Dubin on: 07/16/2015 05:36 PM   Modules accepted: Miquel Dunn

## 2015-07-17 NOTE — Progress Notes (Signed)
Internal Medicine Clinic Attending  Case discussed with Dr. Patel,Rushil at the time of the visit.  We reviewed the resident's history and exam and pertinent patient test results.  I agree with the assessment, diagnosis, and plan of care documented in the resident's note.  

## 2015-07-17 NOTE — Addendum Note (Signed)
Addended by: Riccardo Dubin on: 07/17/2015 08:43 AM   Modules accepted: Miquel Dunn

## 2015-07-23 ENCOUNTER — Ambulatory Visit: Payer: Medicare Other | Admitting: Podiatry

## 2015-07-23 DIAGNOSIS — C61 Malignant neoplasm of prostate: Secondary | ICD-10-CM | POA: Diagnosis not present

## 2015-07-23 DIAGNOSIS — K625 Hemorrhage of anus and rectum: Secondary | ICD-10-CM | POA: Diagnosis not present

## 2015-07-23 DIAGNOSIS — I10 Essential (primary) hypertension: Secondary | ICD-10-CM | POA: Diagnosis not present

## 2015-07-24 ENCOUNTER — Ambulatory Visit (INDEPENDENT_AMBULATORY_CARE_PROVIDER_SITE_OTHER): Payer: Medicare Other | Admitting: Internal Medicine

## 2015-07-24 ENCOUNTER — Encounter: Payer: Self-pay | Admitting: Internal Medicine

## 2015-07-24 VITALS — BP 147/101 | HR 76 | Temp 98.2°F | Wt 165.0 lb

## 2015-07-24 DIAGNOSIS — Z87891 Personal history of nicotine dependence: Secondary | ICD-10-CM | POA: Diagnosis not present

## 2015-07-24 DIAGNOSIS — I10 Essential (primary) hypertension: Secondary | ICD-10-CM

## 2015-07-24 DIAGNOSIS — K921 Melena: Secondary | ICD-10-CM

## 2015-07-24 MED ORDER — AMLODIPINE BESYLATE 5 MG PO TABS: 5.0000 mg | ORAL_TABLET | Freq: Every day | ORAL | Status: DC

## 2015-07-24 NOTE — Patient Instructions (Signed)
William Jones it was nice meeting you today.  -Continue taking Amlodipine 5 mg daily.  -Return for a follow-up visit in 3 months.
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
## 2015-07-27 NOTE — Assessment & Plan Note (Signed)
Patient states he has already seen Dr. Benson Norway and has a colonoscopy scheduled on 08/06/2015. -Follow-up colonoscopy results

## 2015-07-27 NOTE — Assessment & Plan Note (Addendum)
BP Readings from Last 3 Encounters:  07/24/15 147/101  07/16/15 177/112  12/18/14 145/99    Lab Results  Component Value Date   NA 144 12/06/2014   K 3.6 12/06/2014   CREATININE 1.10 12/06/2014    Assessment: Blood pressure control:  above goal, < 140/90 Progress toward BP goal:    improved Comments: During his previous visit patient was started on amlodipine 5 mg daily and states he has been taking the medication regularly. Denies any episodes of headaches, chest pain, or shortness of breath.  Plan: Medications:  continue current medications Educational resources provided:   Encouraged healthy eating (low-sodium diet) and exercise. Other plans: -BMP at next visit in 3 months.

## 2015-07-28 DIAGNOSIS — Z23 Encounter for immunization: Secondary | ICD-10-CM | POA: Diagnosis not present

## 2015-08-04 NOTE — Progress Notes (Signed)
Internal Medicine Clinic Attending  I saw and evaluated the patient.  I personally confirmed the key portions of the history and exam documented by Dr. Rathore and I reviewed pertinent patient test results.  The assessment, diagnosis, and plan were formulated together and I agree with the documentation in the resident's note.  

## 2015-08-06 DIAGNOSIS — K625 Hemorrhage of anus and rectum: Secondary | ICD-10-CM | POA: Diagnosis not present

## 2015-08-06 DIAGNOSIS — K921 Melena: Secondary | ICD-10-CM | POA: Diagnosis not present

## 2015-08-06 DIAGNOSIS — K627 Radiation proctitis: Secondary | ICD-10-CM | POA: Diagnosis not present

## 2015-08-06 DIAGNOSIS — K6289 Other specified diseases of anus and rectum: Secondary | ICD-10-CM | POA: Diagnosis not present

## 2015-08-19 ENCOUNTER — Ambulatory Visit: Payer: Medicare Other | Admitting: Podiatry

## 2015-09-03 ENCOUNTER — Other Ambulatory Visit: Payer: Self-pay | Admitting: Gastroenterology

## 2015-09-04 ENCOUNTER — Encounter (HOSPITAL_COMMUNITY): Payer: Self-pay | Admitting: *Deleted

## 2015-09-04 NOTE — H&P (Signed)
  William Jones [place] HPI: The patient was evaluated with an EGD, colonoscopy, and capsule endoscopy on 01/10/2014. At the time of admission he was found to have an HGB of 3.3 g/dL. The EGD and colonsocopy were negative and his capsule endoscope was only significant for a small nonbleeding distal small bowel AVM. Over the interval time period the patient's HGB increased up to 12.9 g/dL, which is the alst recorded value on 12/06/2014. At this time he reports interminttent hematochezia. Last year he was found to have a higher grade prostate cancer and he was treated with XRT and seeds. There is no pain with a bowel movement at the anus. He reports that his rectum comes out and he has to push it back in after a bowel movement.  His FFS on 08/06/2015 in the office was significant for a radiation proctitis.  Past Medical History  Diagnosis Date  . History of frostbite   . Prostate cancer ([place]) 03/05/14    Gleason 7, volume 45 gm  . Iron deficiency anemia   . Peripheral neuropathy ([place])   . Duodenitis   . Thrombocytopenia ([place])   . H/O: substance abuse   . S/P radiation therapy 09/18/14-11/15/14    prostate/ 7800Gy/40sessions  . Essential hypertension 07/16/2015  . History of radiation therapy nov 2015 to feb 2016    Past Surgical History  Procedure Laterality Date  . Colonoscopy N/A 01/10/2014    Procedure: COLONOSCOPY;  Surgeon: [name], MD;  Location: [place];  Service: Endoscopy;  Laterality: N/A;  . Esophagogastroduodenoscopy N/A 01/10/2014    Procedure: ESOPHAGOGASTRODUODENOSCOPY (EGD);  Surgeon: [name], MD;  Location: [facility] ENDOSCOPY;  Service: Endoscopy;  Laterality: N/A;  . Givens capsule study N/A 01/10/2014    Procedure: GIVENS CAPSULE STUDY;  Surgeon: [name], MD;  Location: [place];  Service: Endoscopy;  Laterality: N/A;  . Prostate biopsy  03/05/14    Gleason 7, vol 45 gm    Family History  Problem Relation Age of Onset  . Stroke Maternal Uncle     70s-80s  .  Kidney failure Brother 82    has been on HD since age 55     Social History:  reports that he quit smoking about 7 years ago. His smoking use included Cigarettes. He has never used smokeless tobacco. He reports that he drinks alcohol. He reports that he does not use illicit drugs.  Allergies: No Known Allergies  Medications: Scheduled: Continuous:  No results found for this or any previous visit (from the past 24 hour(s)).   No results found.  ROS:  As stated above in the HPI otherwise negative.  There were no vitals taken for this visit.    PE: Gen: NAD, Alert and Oriented HEENT:  [place]/AT, EOMI Neck: Supple, no LAD Lungs: CTA Bilaterally CV: RRR without M/G/R ABM: Soft, NTND, +BS Ext: No C/C/E  Assessment/Plan: 1) Radiation proctitis and hematochezia - FFS with APC.  William Jones D 09/04/2015, 3:49 PM

## 2015-09-09 ENCOUNTER — Ambulatory Visit (INDEPENDENT_AMBULATORY_CARE_PROVIDER_SITE_OTHER): Payer: Medicare Other | Admitting: Podiatry

## 2015-09-09 DIAGNOSIS — M79676 Pain in unspecified toe(s): Secondary | ICD-10-CM | POA: Diagnosis not present

## 2015-09-09 DIAGNOSIS — Q828 Other specified congenital malformations of skin: Secondary | ICD-10-CM | POA: Diagnosis not present

## 2015-09-09 DIAGNOSIS — B351 Tinea unguium: Secondary | ICD-10-CM | POA: Diagnosis not present

## 2015-09-09 DIAGNOSIS — E1140 Type 2 diabetes mellitus with diabetic neuropathy, unspecified: Secondary | ICD-10-CM

## 2015-09-10 NOTE — Progress Notes (Signed)
Patient ID: William Jones, male   DOB: Feb 12, 1951, 64 y.o.   MRN: [id number]  Subjective: 64 y.o. returns the office today for painful, elongated, thickened toenails which he cannot trim himself. He also states that he has calluses which are painful. Denies any redness or drainage around the nails/calluses. Denies any acute changes since last appointment and no new complaints today. Denies any systemic complaints such as fevers, chills, nausea, vomiting.   Objective: AAO 3, NAD DP/PT pulses palpable, CRT less than 3 seconds.  Nails hypertrophic, dystrophic, elongated, brittle, discolored 10. There is tenderness overlying the nails 1-5 bilaterally. There is no surrounding erythema or drainage along the nail sites. Hyperkeratotic lesions at the distal aspect left second toe as well as submetatarsal 5 left foot. Upon debridement no underlying ulceration, drainage or other signs of infection. No open lesions or pre-ulcerative lesions are identified. No other areas of tenderness bilateral lower extremities. No overlying edema, erythema, increased warmth. No pain with calf compression, swelling, warmth, erythema.  Assessment: Patient presents with symptomatic onychomycosis; porokertosis   Plan: -Treatment options including alternatives, risks, complications were discussed -Nails sharply debrided 10 without complication/bleeding. -Hyperkeratotic lesion 2 shop and debrided without complication/bleeding. -Discussed daily foot inspection. If there are any changes, to call the office immediately.  -Follow-up in 3 months or sooner if any problems are to arise. In the meantime, encouraged to call the office with any questions, concerns, changes symptoms.  Celesta Gentile, DPM

## 2015-09-11 NOTE — Anesthesia Preprocedure Evaluation (Addendum)
Anesthesia Evaluation  Patient identified by MRN, date of birth, ID band Patient awake    Reviewed: Allergy & Precautions, H&P , NPO status , Patient's Chart, lab work & pertinent test results  Airway Mallampati: II  TM Distance: >3 FB Neck ROM: Full    Dental no notable dental hx. (+) Chipped,    Pulmonary former smoker,    Pulmonary exam normal breath sounds clear to auscultation       Cardiovascular hypertension, Pt. on medications negative cardio ROS Normal cardiovascular exam Rhythm:Regular Rate:Normal  ECHO 2009 EF 75%   Neuro/Psych  Neuromuscular disease negative psych ROS   GI/Hepatic (+)     substance abuse  alcohol use and marijuana use, GI Bleeding   Endo/Other  negative endocrine ROS  Renal/GU negative Renal ROS  negative genitourinary   Musculoskeletal negative musculoskeletal ROS (+)   Abdominal   Peds  Hematology  (+) anemia ,   Anesthesia Other Findings   Reproductive/Obstetrics negative OB ROS                            Anesthesia Physical  Anesthesia Plan  ASA: III  Anesthesia Plan: MAC   Post-op Pain Management:    Induction: Intravenous  Airway Management Planned: Nasal Cannula  Additional Equipment:   Intra-op Plan:   Post-operative Plan:   Informed Consent: I have reviewed the patients History and Physical, chart, labs and discussed the procedure including the risks, benefits and alternatives for the proposed anesthesia with the patient or authorized representative who has indicated his/her understanding and acceptance.     Plan Discussed with: Anesthesiologist, Surgeon and CRNA  Anesthesia Plan Comments:         Anesthesia Quick Evaluation

## 2015-09-18 NOTE — H&P (Signed)
  William Jones [place] HPI: The patient was evaluated with an EGD, colonoscopy, and capsule endoscopy on 01/10/2014. At the time of admission he was found to have an HGB of 3.3 g/dL. The EGD and colonsocopy were negative and his capsule endoscope was only significant for a small nonbleeding distal small bowel AVM. Over the interval time period the patient's HGB increased up to 12.9 g/dL, which is the last recorded value on 12/06/2014. At this time he reports interminttent hematochezia. Last year he was found to have a higher grade prostate cancer and he was treated with XRT and seeds. There is no pain with a bowel movement at the anus. He reports that his rectum comes out and he has to push it back in after a bowel movement.  The FFS performed on 08/06/2015 was revealing for radiation proctitis.  Past Medical History  Diagnosis Date  . History of frostbite   . Prostate cancer ([place]) 03/05/14    Gleason 7, volume 45 gm  . Iron deficiency anemia   . Peripheral neuropathy ([place])   . Duodenitis   . Thrombocytopenia ([place])   . H/O: substance abuse   . S/P radiation therapy 09/18/14-11/15/14    prostate/ 7800Gy/40sessions  . Essential hypertension 07/16/2015  . History of radiation therapy nov 2015 to feb 2016    Past Surgical History  Procedure Laterality Date  . Colonoscopy N/A 01/10/2014    Procedure: COLONOSCOPY;  Surgeon: [name], MD;  Location: [place];  Service: Endoscopy;  Laterality: N/A;  . Esophagogastroduodenoscopy N/A 01/10/2014    Procedure: ESOPHAGOGASTRODUODENOSCOPY (EGD);  Surgeon: [name], MD;  Location: [facility] ENDOSCOPY;  Service: Endoscopy;  Laterality: N/A;  . Givens capsule study N/A 01/10/2014    Procedure: GIVENS CAPSULE STUDY;  Surgeon: [name], MD;  Location: [place];  Service: Endoscopy;  Laterality: N/A;  . Prostate biopsy  03/05/14    Gleason 7, vol 45 gm    Family History  Problem Relation Age of Onset  . Stroke Maternal Uncle     70s-80s  . Kidney  failure Brother 3    has been on HD since age 89     Social History:  reports that he quit smoking about 7 years ago. His smoking use included Cigarettes. He has never used smokeless tobacco. He reports that he drinks alcohol. He reports that he does not use illicit drugs.  Allergies: No Known Allergies  Medications: Scheduled: Continuous:  No results found for this or any previous visit (from the past 24 hour(s)).   No results found.  ROS:  As stated above in the HPI otherwise negative.  There were no vitals taken for this visit.    PE: Gen: NAD, Alert and Oriented HEENT:  [place]/AT, EOMI Neck: Supple, no LAD Lungs: CTA Bilaterally CV: RRR without M/G/R ABM: Soft, NTND, +BS Ext: No C/C/E  Assessment/Plan: 1) Radiation proctitis - FFS with APC.  Shanta Hartner D 09/18/2015, 12:50 PM

## 2015-09-19 ENCOUNTER — Encounter (HOSPITAL_COMMUNITY)
Admission: RE | Disposition: A | Discharge status: Home / Self Care | Payer: Self-pay | Source: Ambulatory Visit | Attending: Gastroenterology

## 2015-09-19 ENCOUNTER — Ambulatory Visit (HOSPITAL_COMMUNITY): Payer: Medicare Other | Admitting: Anesthesiology

## 2015-09-19 ENCOUNTER — Ambulatory Visit (HOSPITAL_COMMUNITY)
Admission: RE | Admit: 2015-09-19 | Discharge: 2015-09-19 | Disposition: A | Discharge status: Home / Self Care | Payer: Medicare Other | Source: Ambulatory Visit | Attending: Gastroenterology | Admitting: Gastroenterology

## 2015-09-19 ENCOUNTER — Encounter (HOSPITAL_COMMUNITY): Payer: Self-pay | Admitting: *Deleted

## 2015-09-19 DIAGNOSIS — C61 Malignant neoplasm of prostate: Secondary | ICD-10-CM | POA: Diagnosis not present

## 2015-09-19 DIAGNOSIS — Z87891 Personal history of nicotine dependence: Secondary | ICD-10-CM | POA: Diagnosis not present

## 2015-09-19 DIAGNOSIS — K5521 Angiodysplasia of colon with hemorrhage: Secondary | ICD-10-CM | POA: Insufficient documentation

## 2015-09-19 DIAGNOSIS — K627 Radiation proctitis: Secondary | ICD-10-CM | POA: Insufficient documentation

## 2015-09-19 DIAGNOSIS — K625 Hemorrhage of anus and rectum: Secondary | ICD-10-CM | POA: Diagnosis not present

## 2015-09-19 DIAGNOSIS — Z923 Personal history of irradiation: Secondary | ICD-10-CM | POA: Insufficient documentation

## 2015-09-19 DIAGNOSIS — Z79899 Other long term (current) drug therapy: Secondary | ICD-10-CM | POA: Diagnosis not present

## 2015-09-19 DIAGNOSIS — Z8546 Personal history of malignant neoplasm of prostate: Secondary | ICD-10-CM | POA: Diagnosis not present

## 2015-09-19 DIAGNOSIS — G629 Polyneuropathy, unspecified: Secondary | ICD-10-CM | POA: Insufficient documentation

## 2015-09-19 DIAGNOSIS — D509 Iron deficiency anemia, unspecified: Secondary | ICD-10-CM | POA: Diagnosis not present

## 2015-09-19 DIAGNOSIS — I10 Essential (primary) hypertension: Secondary | ICD-10-CM | POA: Diagnosis not present

## 2015-09-19 HISTORY — DX: Personal history of irradiation: Z92.3

## 2015-09-19 HISTORY — PX: HOT HEMOSTASIS: SHX5433

## 2015-09-19 HISTORY — DX: Essential (primary) hypertension: I10

## 2015-09-19 HISTORY — PX: FLEXIBLE SIGMOIDOSCOPY: SHX5431

## 2015-09-19 SURGERY — SIGMOIDOSCOPY, FLEXIBLE
Anesthesia: Monitor Anesthesia Care

## 2015-09-19 MED ORDER — PROMETHAZINE HCL 25 MG/ML IJ SOLN: 6.2500 mg | INTRAMUSCULAR | Status: DC | PRN

## 2015-09-19 MED ORDER — MEPERIDINE HCL 100 MG/ML IJ SOLN: 6.2500 mg | INTRAMUSCULAR | Status: DC | PRN

## 2015-09-19 MED ORDER — PROPOFOL 10 MG/ML IV BOLUS
INTRAVENOUS | Status: DC | PRN
  Administered 2015-09-19: 20 mg via INTRAVENOUS
  Administered 2015-09-19: 80 mg via INTRAVENOUS

## 2015-09-19 MED ORDER — SODIUM CHLORIDE 0.9 % IV SOLN: INTRAVENOUS | Status: DC

## 2015-09-19 MED ORDER — LACTATED RINGERS IV SOLN
INTRAVENOUS | Status: DC
Start: 2015-09-19 — End: 2015-09-19
  Administered 2015-09-19: 1000 mL via INTRAVENOUS

## 2015-09-19 MED ORDER — PROPOFOL 10 MG/ML IV BOLUS
INTRAVENOUS | Status: AC
  Filled 2015-09-19: qty 40

## 2015-09-19 MED ORDER — FENTANYL CITRATE (PF) 100 MCG/2ML IJ SOLN: 25.0000 ug | INTRAMUSCULAR | Status: DC | PRN

## 2015-09-19 MED ORDER — PROPOFOL 500 MG/50ML IV EMUL
INTRAVENOUS | Status: DC | PRN
  Administered 2015-09-19: 100 ug/kg/min via INTRAVENOUS

## 2015-09-19 NOTE — Anesthesia Postprocedure Evaluation (Signed)
Anesthesia Post Note  Patient: [name] [facility]  Procedure(s) Performed: Procedure(s) (LRB): FLEXIBLE SIGMOIDOSCOPY (N/A) HOT HEMOSTASIS (ARGON PLASMA COAGULATION/BICAP) (N/A)  Patient location during evaluation: Endoscopy Anesthesia Type: MAC Level of consciousness: awake and alert Pain management: pain level controlled Vital Signs Assessment: post-procedure vital signs reviewed and stable Respiratory status: spontaneous breathing, nonlabored ventilation, respiratory function stable and patient connected to nasal cannula oxygen Cardiovascular status: blood pressure returned to baseline and stable Postop Assessment: no signs of nausea or vomiting Anesthetic complications: no    Last Vitals:  Filed Vitals:   09/19/15 1100 09/19/15 1110  BP: 160/100 158/109  Pulse: 70 67  Temp:    Resp: 18 12    Last Pain: There were no vitals filed for this visit.               William Jones

## 2015-09-19 NOTE — Discharge Instructions (Signed)
Flexible Sigmoidoscopy, Care After  Refer to this sheet in the next few weeks. These instructions provide you with information on caring for yourself after your procedure. Your health care provider may also give you more specific instructions. Your treatment has been planned according to current medical practices, but problems sometimes occur. Call your health care provider if you have any problems or questions after your procedure.  WHAT TO EXPECT AFTER THE PROCEDURE  After your procedure, it is typical to have the following:   · Abdominal cramps.  · Bloating.  · A small amount of rectal bleeding if you had a biopsy.  HOME CARE INSTRUCTIONS  · Only take over-the-counter or prescription medicines for pain, fever, or discomfort as directed by your health care provider.  · Resume your normal diet and activities as directed by your health care provider.  SEEK MEDICAL CARE IF:  · You have abdominal pain or cramping that lasts longer than 1 hour after the procedure.  · You continue to have small amounts of rectal bleeding after 24 hours.  · You have nausea or vomiting.  · You feel weak or dizzy.  SEEK IMMEDIATE MEDICAL CARE IF:   · You have a fever.  · You pass large blood clots or see a large amount of blood in the toilet after having a bowel movement. This may also occur 10-14 days after the procedure. It is more likely if you had a biopsy.  · You develop abdominal pain that is not relieved with medicine or your abdominal pain gets worse.  · You have nausea or vomiting for more than 24 hours after the procedure.     This information is not intended to replace advice given to you by your health care provider. Make sure you discuss any questions you have with your health care provider.     Document Released: 09/25/2013 Document Reviewed: 09/25/2013  Elsevier Interactive Patient Education ©2016 Elsevier Inc[employer]

## 2015-09-19 NOTE — Transfer of Care (Signed)
Immediate Anesthesia Transfer of Care Note  Patient: [facility]  Procedure(s) Performed: Procedure(s): FLEXIBLE SIGMOIDOSCOPY (N/A) HOT HEMOSTASIS (ARGON PLASMA COAGULATION/BICAP) (N/A)  Patient Location: PACU  Anesthesia Type:MAC  Level of Consciousness:  sedated, patient cooperative and responds to stimulation  Airway & Oxygen Therapy:Patient Spontanous Breathing and Patient connected to face mask oxgen  Post-op Assessment:  Report given to PACU RN and Post -op Vital signs reviewed and stable  Post vital signs:  Reviewed and stable  Last Vitals:  Filed Vitals:   09/19/15 0948  BP: 146/97  Pulse: 76  Temp: 36.7 C  Resp: 14    Complications: No apparent anesthesia complications

## 2015-09-19 NOTE — Op Note (Signed)
[facility] [place], [address]   FLEXIBLE SIGMOIDOSCOPY PROCEDURE REPORT  PATIENT: William Jones, William Jones  MR#: [id number] BIRTHDATE: [birth year] , 18  yrs. old GENDER: male ENDOSCOPIST: Carol Ada, MD REFERRED BY: PROCEDURE DATE:  09/19/2015 PROCEDURE:   Sigmoidoscopy with ablation therapy ASA CLASS:   Class III INDICATIONS: Radiation proctitis with hematochezia MEDICATIONS: Monitored anesthesia care  DESCRIPTION OF PROCEDURE:   After the risks benefits and alternatives of the procedure were thoroughly explained, informed consent was obtained.  Digital exam revealed no abnormalities of the rectum. The     endoscope was introduced through the anus  and advanced to the sigmoid colon , The exam was Without limitations. The quality of the prep was The overall prep quality was excellent. . Estimated blood loss is zero unless otherwise noted in this procedure report. The instrument was then slowly withdrawn as the mucosa was fully examined.        FINDINGS: The prep for the rectum was excellent.  On of the AVMs was bleeding, mildly.  The majority of the AVMs were ablated with APC. Retroflexed views revealed no abnormalities outside of the radiation proctitis.    The scope was then withdrawn from the patient and the procedure terminated.  COMPLICATIONS: There were no immediate complications.  ENDOSCOPIC IMPRESSION: 1) Radiation proctitis s/p APC.  RECOMMENDATIONS: 1) Follow up in the office in one month.  REPEAT EXAM:  eSigned:  Carol Ada, MD 09/19/2015 10:31 AM   CC:

## 2015-09-22 ENCOUNTER — Encounter (HOSPITAL_COMMUNITY): Payer: Self-pay | Admitting: Gastroenterology

## 2015-09-23 ENCOUNTER — Ambulatory Visit (INDEPENDENT_AMBULATORY_CARE_PROVIDER_SITE_OTHER): Payer: Medicare Other | Admitting: Internal Medicine

## 2015-09-23 ENCOUNTER — Encounter: Payer: Self-pay | Admitting: Internal Medicine

## 2015-09-23 VITALS — BP 130/70 | HR 80 | Temp 98.3°F | Wt 166.2 lb

## 2015-09-23 DIAGNOSIS — K921 Melena: Secondary | ICD-10-CM | POA: Diagnosis not present

## 2015-09-23 DIAGNOSIS — I10 Essential (primary) hypertension: Secondary | ICD-10-CM | POA: Diagnosis present

## 2015-09-23 DIAGNOSIS — C61 Malignant neoplasm of prostate: Secondary | ICD-10-CM | POA: Diagnosis not present

## 2015-09-23 DIAGNOSIS — Z923 Personal history of irradiation: Secondary | ICD-10-CM | POA: Diagnosis not present

## 2015-09-23 MED ORDER — FERROUS SULFATE 325 (65 FE) MG PO TABS: 325.0000 mg | ORAL_TABLET | Freq: Every day | ORAL | Status: DC

## 2015-09-23 MED ORDER — AMLODIPINE BESYLATE 5 MG PO TABS: 5.0000 mg | ORAL_TABLET | Freq: Every day | ORAL | Status: DC

## 2015-09-23 NOTE — Patient Instructions (Signed)
Mr. Cail it was nice seeing you today.  -Continue taking Amlodipine 5 mg once daily.  -Take Ferrous sulfate 325 mg once daily.  -Make sure you follow-up with Dr. Benson Norway (gastroenterology) within 1 month.   -Return to the clinic for a follow-up visit in 6 months.

## 2015-09-24 ENCOUNTER — Encounter: Payer: Self-pay | Admitting: Internal Medicine

## 2015-09-24 DIAGNOSIS — K627 Radiation proctitis: Secondary | ICD-10-CM | POA: Insufficient documentation

## 2015-09-26 NOTE — Progress Notes (Signed)
Patient ID: William Jones, male   DOB: Mar 05, 1951, 64 y.o.   MRN: [id number]   Subjective:   Patient ID: William Jones male   DOB: Jul 19, 1951 64 y.o.   MRN: [id number]  HPI: [name] is a 64 y.o. M with a PMHx of HTN, prostate cancer s/p radiation, peripheral neuropathy, IDA and conditions listed below presenting to the clinic for a follow-up of his HTN. Please see assessment and plan for the status of the patient's chronic medical conditions.     Past Medical History  Diagnosis Date  . History of frostbite   . Prostate cancer ([place]) 03/05/14    Gleason 7, volume 45 gm  . Iron deficiency anemia   . Peripheral neuropathy ([place])   . Duodenitis   . Thrombocytopenia ([place])   . H/O: substance abuse   . S/P radiation therapy 09/18/14-11/15/14    prostate/ 7800Gy/40sessions  . Essential hypertension 07/16/2015  . History of radiation therapy nov 2015 to feb 2016   Current Outpatient Prescriptions  Medication Sig Dispense Refill  . amLODipine (NORVASC) 5 MG tablet Take 1 tablet (5 mg total) by mouth daily. 90 tablet 3  . docusate sodium (COLACE) 50 MG capsule Take 50 mg by mouth daily as needed for mild constipation.    . ferrous sulfate 325 (65 FE) MG tablet Take 1 tablet (325 mg total) by mouth daily with breakfast. 90 tablet 3   No current facility-administered medications for this visit.   Family History  Problem Relation Age of Onset  . Stroke Maternal Uncle     70s-80s  . Kidney failure Brother 83    has been on HD since age 44    Social History   Social History  . Marital Status: Widowed    Spouse Name: N/A  . Number of Children: N/A  . Years of Education: N/A   Occupational History  . retired    Social History Main Topics  . Smoking status: Former Smoker    Types: Cigarettes    Quit date: 10/05/2007  . Smokeless tobacco: Never Used  . Alcohol Use: 0.0 oz/week    0 Standard drinks or equivalent per week     Comment: 2-3 days per week  .  Drug Use: No     Comment: per Epic note 2009hx of crack cocaine use 12 years ago per pt, no current marijuana use per pt  . Sexual Activity: Not Currently   Other Topics Concern  . None   Social History Narrative   On disability - frostbite. Used to work for [employer]. Retired in 1990. Has 2 kids. 6th grade.    Review of Systems: Review of Systems  Constitutional: Negative for fever and chills.  HENT: Negative for congestion.   Eyes: Negative for blurred vision and pain.  Respiratory: Negative for cough, shortness of breath and wheezing.   Cardiovascular: Negative for chest pain, palpitations and leg swelling.  Gastrointestinal: Negative for nausea, vomiting, abdominal pain, diarrhea and constipation.  Genitourinary: Negative for dysuria, urgency and frequency.  Musculoskeletal: Negative for myalgias and joint pain.  Skin: Negative for rash.  Neurological: Negative for dizziness, sensory change, focal weakness and headaches.    Objective:  Physical Exam: Filed Vitals:   09/23/15 1326 09/23/15 1345  BP: 138/93 130/70  Pulse: 98 80  Temp: 98.3 F (36.8 C)   TempSrc: Oral   Weight: 75.388 kg (166 lb 3.2 oz)   SpO2: 100%    Physical Exam  Constitutional: He  is oriented to person, place, and time. He appears well-developed and well-nourished. No distress.  HENT:  Head: Normocephalic and atraumatic.  Eyes: EOM are normal. Pupils are equal, round, and reactive to light.  Neck: Neck supple. No tracheal deviation present.  Cardiovascular: Normal rate, regular rhythm and intact distal pulses.  Exam reveals no gallop and no friction rub.   No murmur heard. Pulmonary/Chest: Effort normal. No respiratory distress. He has no wheezes. He has no rales.  Abdominal: Soft. Bowel sounds are normal. He exhibits no distension. There is no tenderness.  Musculoskeletal: He exhibits no edema.  Neurological: He is alert and oriented to person, place, and time.  Skin: Skin is warm and dry.    Assessment & Plan:

## 2015-09-26 NOTE — Assessment & Plan Note (Signed)
Patient had a flexible sigmoidoscopy done by Dr. Benson Norway on 09/19/15 showing radiation proctitis s/p APC. He as a follow-up appointment with Dr. Benson Norway in 1 month. States his condition has improved significantly - he now only occasionally notices some blood in his stool. Hgb was 11.3 on 07/23/15. Patient denies feeling weak/ tired. Denies having any dizziness/ lightheadedness, chest pain, or dsypnea on exertion.  -Continue iron supplement -I encouraged him to go to his appointment with Dr. Benson Norway in 1 month.

## 2015-09-26 NOTE — Assessment & Plan Note (Signed)
BP Readings from Last 3 Encounters:  09/23/15 130/70  09/19/15 156/104  07/24/15 147/101    Lab Results  Component Value Date   NA 144 12/06/2014   K 3.6 12/06/2014   CREATININE 1.10 12/06/2014    Assessment: Blood pressure control:  controlled (below goal <140/90) Progress toward BP goal:   improved  Comments: Patient is currently on Amlodipine 5 mg daily.   Plan: Medications:  continue current medications Educational resources provided:  Discussed healthy eating and exercise.

## 2015-09-26 NOTE — Assessment & Plan Note (Signed)
Patient had radiation therapy for prostate cancer from 08/2014 to 11/2014. He last visited radiation-oncologist Dr. Valere Dross in 12/2014. States he is currently following up with Dr. Sandria Manly at [facility] every 3 months; next appointment in Jan 2017. Also reports getting Lupron shots every few months at the cancer center.  -I encouraged patient to go to his appointment with Dr. Valere Dross in January 2017.

## 2015-09-28 NOTE — Addendum Note (Signed)
Addended by: Oval Linsey D on: 09/28/2015 03:54 PM   Modules accepted: Level of Service

## 2015-09-28 NOTE — Progress Notes (Signed)
I saw and evaluated the patient.  I personally confirmed the key portions of Dr. Rathore's history and exam and reviewed pertinent patient test results.  The assessment, diagnosis, and plan were formulated together and I agree with the documentation in the resident's note. 

## 2015-10-20 DIAGNOSIS — K625 Hemorrhage of anus and rectum: Secondary | ICD-10-CM | POA: Diagnosis not present

## 2015-10-20 DIAGNOSIS — K627 Radiation proctitis: Secondary | ICD-10-CM | POA: Diagnosis not present

## 2015-10-27 DIAGNOSIS — C61 Malignant neoplasm of prostate: Secondary | ICD-10-CM | POA: Diagnosis not present

## 2015-11-03 DIAGNOSIS — C61 Malignant neoplasm of prostate: Secondary | ICD-10-CM | POA: Diagnosis not present

## 2015-11-03 DIAGNOSIS — Z0000 Encounter for general adult medical examination without abnormal findings: Secondary | ICD-10-CM | POA: Diagnosis not present

## 2015-12-19 ENCOUNTER — Ambulatory Visit (INDEPENDENT_AMBULATORY_CARE_PROVIDER_SITE_OTHER): Payer: Medicare Other | Admitting: Podiatry

## 2015-12-19 ENCOUNTER — Encounter: Payer: Self-pay | Admitting: Podiatry

## 2015-12-19 DIAGNOSIS — B351 Tinea unguium: Secondary | ICD-10-CM

## 2015-12-19 DIAGNOSIS — M79676 Pain in unspecified toe(s): Secondary | ICD-10-CM

## 2015-12-19 DIAGNOSIS — Q828 Other specified congenital malformations of skin: Secondary | ICD-10-CM

## 2015-12-19 DIAGNOSIS — E1140 Type 2 diabetes mellitus with diabetic neuropathy, unspecified: Secondary | ICD-10-CM | POA: Diagnosis not present

## 2015-12-19 NOTE — Progress Notes (Signed)
Patient ID: William Jones, male   DOB: [birth year], 65 y.o.   MRN: [id number]  Subjective: 65 y.o. returns the office today for painful, elongated, thickened toenails which he cannot trim himself. He also states that he has calluses which are painful. Denies any redness or drainage around the nails/calluses. Denies any acute changes since last appointment and no new complaints today. Denies any systemic complaints such as fevers, chills, nausea, vomiting.   Objective: AAO 3, NAD DP/PT pulses palpable, CRT less than 3 seconds.  Nails hypertrophic, dystrophic, elongated, brittle, discolored 10. There is tenderness overlying the nails 1-5 bilaterally. There is no surrounding erythema or drainage along the nail sites. Hyperkeratotic lesions at the distal aspect left second toe as well as submetatarsal 5 left foot and left medial hallux. Upon debridement no underlying ulceration, drainage or other signs of infection. No open lesions or pre-ulcerative lesions are identified. No other areas of tenderness bilateral lower extremities. No overlying edema, erythema, increased warmth. No pain with calf compression, swelling, warmth, erythema.  Assessment: Patient presents with symptomatic onychomycosis; porokertosis   Plan: -Treatment options including alternatives, risks, complications were discussed -Nails sharply debrided 10 without complication/bleeding. -Hyperkeratotic lesion 3 sharply and debrided without complication/bleeding. -Discussed daily foot inspection. If there are any changes, to call the office immediately.  -Follow-up in 3 months or sooner if any problems are to arise. In the meantime, encouraged to call the office with any questions, concerns, changes symptoms.  Celesta Gentile, DPM

## 2016-01-20 ENCOUNTER — Telehealth: Payer: Self-pay | Admitting: Internal Medicine

## 2016-01-20 NOTE — Telephone Encounter (Signed)
APPT. REMINDER CALL, LMTCB °

## 2016-01-21 ENCOUNTER — Encounter: Payer: Self-pay | Admitting: Internal Medicine

## 2016-01-21 ENCOUNTER — Ambulatory Visit (INDEPENDENT_AMBULATORY_CARE_PROVIDER_SITE_OTHER): Payer: Medicare Other | Admitting: Internal Medicine

## 2016-01-21 VITALS — BP 127/82 | HR 61 | Temp 98.1°F | Ht 69.0 in | Wt 168.0 lb

## 2016-01-21 DIAGNOSIS — K921 Melena: Secondary | ICD-10-CM

## 2016-01-21 DIAGNOSIS — L720 Epidermal cyst: Secondary | ICD-10-CM | POA: Diagnosis not present

## 2016-01-21 NOTE — Progress Notes (Signed)
Medicine attending: Medical history, presenting problems, physical findings, and medications, reviewed with resident physician Dr Gwenlyn Fudge on the day of the patient visit and I concur with his evaluation and management plan. The resident performed a minor incision on a back abscess. I was continuously available in the clinic for direct supervision if needed. There were no complications.

## 2016-01-23 DIAGNOSIS — L720 Epidermal cyst: Secondary | ICD-10-CM | POA: Insufficient documentation

## 2016-01-23 NOTE — Assessment & Plan Note (Signed)
Patient with moderate-sized epidermoid cyst in the central mid-thoracic back, unable for him to drain himself due to location. Performed I&D in clinic for him today. -Provided informed consent for simple I&D here -Cleaned area with chlorhexidine swabs x 3 -Made small simple horizontal incision -Able to express moderate, cottage cheese-like, foul-smelling drainage consistent with epidermoid cyst -Patient tolerated without pain or significant bleeding -Re-cleaned area with chlorhexidine and placed a Band-aid -Instructed the patient that these do have a tendency to come back and told him to make another appointment if it does

## 2016-01-23 NOTE — Assessment & Plan Note (Signed)
Patient not having recurrent hemotochezia at this time since intervention. However, this does not exclude the possibility of recurrent hematochezia from RT proctitis in the future. -Continue to monitor

## 2016-01-23 NOTE — Progress Notes (Signed)
   Patient ID: William Jones male   DOB: 1951/09/27 65 y.o.   MRN: [id number]  Subjective:   HPI: [name] is a 65 y.o. with PMH of HTN, prostate cancer s/p RT in 2016 now on adjuvant ADT with Lupron (last injection next month) who presents to [facility] today for a bump on his back.  He says the bump has been there in the center of his back for several months, and he popped it a couple of months ago when it was starting to grow. He has been unable to pop it again so comes here for help. He denies any other bumps, denies any pain, fevers, chills, or other symptoms at this time.   Please see problem-based charting for status of medical issues pertinent to this visit.  Review of Systems: Pertinent items noted in HPI and remainder of comprehensive ROS otherwise negative.  Objective:  Physical Exam: Filed Vitals:   01/21/16 1323  BP: 127/82  Pulse: 61  Temp: 98.1 F (36.7 C)  TempSrc: Oral  Height: 5\' 9"  (1.753 m)  Weight: 168 lb (76.204 kg)  SpO2: 100%   Gen: Well-appearing, alert and oriented to person, place, and time CV: Normal rate, regular rhythm, no murmurs, rubs, or gallops Pulmonary: Normal effort, CTA bilaterally, no wheezing, rales, or rhonchi Abdominal: Soft, non-tender, non-distended, without rebound, guarding, or masses Extremities: Distal pulses 2+ in upper and lower extremities bilaterally, no tenderness, erythema or edema Skin: No atypical appearing moles. No rashes. There is a mildly tender epidermoid cyst 4 cm in diameter in the center mid back. No surrounding erythema or other skin changes. No increased warmth. Unable to express drainage manually.  Assessment & Plan:  Please see problem-based charting for assessment and plan.  Blane Ohara, MD Resident Physician, PGY-1 Department of Internal Medicine [facility]

## 2016-02-27 ENCOUNTER — Ambulatory Visit (INDEPENDENT_AMBULATORY_CARE_PROVIDER_SITE_OTHER): Payer: Medicare Other | Admitting: Podiatry

## 2016-02-27 ENCOUNTER — Encounter: Payer: Self-pay | Admitting: Podiatry

## 2016-02-27 DIAGNOSIS — E1140 Type 2 diabetes mellitus with diabetic neuropathy, unspecified: Secondary | ICD-10-CM | POA: Diagnosis not present

## 2016-02-27 DIAGNOSIS — B351 Tinea unguium: Secondary | ICD-10-CM | POA: Diagnosis not present

## 2016-02-27 DIAGNOSIS — Q828 Other specified congenital malformations of skin: Secondary | ICD-10-CM | POA: Diagnosis not present

## 2016-02-27 DIAGNOSIS — M79676 Pain in unspecified toe(s): Secondary | ICD-10-CM | POA: Diagnosis not present

## 2016-02-27 DIAGNOSIS — C61 Malignant neoplasm of prostate: Secondary | ICD-10-CM | POA: Diagnosis not present

## 2016-02-27 NOTE — Progress Notes (Signed)
Patient ID: William Jones, male   DOB: Jul 17, 1951, 65 y.o.   MRN: [id number] Complaint:  Visit Type: Patient returns to my office for continued preventative foot care services. Complaint: Patient states" my nails have grown long and thick and become painful to walk and wear shoes" The patient presents for preventative foot care services. No changes to ROS.  Patient has history of frostbite and absence of distal aspect second toe left foot.  Podiatric Exam: Vascular: dorsalis pedis and posterior tibial pulses are palpable bilateral. Capillary return is immediate. Temperature gradient is WNL. Skin turgor WNL  Sensorium: Normal Semmes Weinstein monofilament test. Normal tactile sensation bilaterally. Nail Exam: Pt has thick disfigured discolored nails with subungual debris noted bilateral entire nail hallux through fifth toenails Ulcer Exam: There is no evidence of ulcer or pre-ulcerative changes or infection. Orthopedic Exam: Muscle tone and strength are WNL. No limitations in general ROM. No crepitus or effusions noted. Foot type and digits show no abnormalities. Bony prominences are unremarkable. Partial amputation second toe left foot. HAV  B/L. Skin: No Porokeratosis. No infection or ulcers.  Callus on dorsum of left hallux and distal clavi distal aspect second toe left foot.  Diagnosis:  Onychomycosis, , Pain in right toe, pain in left toePorokeratosis left foot.  Treatment & Plan Procedures and Treatment: Consent by patient was obtained for treatment procedures. The patient understood the discussion of treatment and procedures well. All questions were answered thoroughly reviewed. Debridement of mycotic and hypertrophic toenails, 1 through 5 bilateral and clearing of subungual debris. No ulceration, no infection noted.  Return Visit-Office Procedure: Patient instructed to return to the office for a follow up visit 3 months for continued evaluation and treatment.    Gardiner Barefoot DPM

## 2016-03-05 ENCOUNTER — Ambulatory Visit: Payer: Medicare Other | Admitting: Podiatry

## 2016-03-05 DIAGNOSIS — C61 Malignant neoplasm of prostate: Secondary | ICD-10-CM | POA: Diagnosis not present

## 2016-03-16 ENCOUNTER — Encounter: Payer: Self-pay | Admitting: Internal Medicine

## 2016-03-16 ENCOUNTER — Ambulatory Visit (INDEPENDENT_AMBULATORY_CARE_PROVIDER_SITE_OTHER): Payer: Medicare Other | Admitting: Internal Medicine

## 2016-03-16 VITALS — BP 111/75 | HR 67 | Temp 98.1°F | Ht 69.0 in | Wt 162.6 lb

## 2016-03-16 DIAGNOSIS — Z872 Personal history of diseases of the skin and subcutaneous tissue: Secondary | ICD-10-CM

## 2016-03-16 DIAGNOSIS — K921 Melena: Secondary | ICD-10-CM

## 2016-03-16 DIAGNOSIS — Z8719 Personal history of other diseases of the digestive system: Secondary | ICD-10-CM | POA: Diagnosis not present

## 2016-03-16 DIAGNOSIS — L720 Epidermal cyst: Secondary | ICD-10-CM

## 2016-03-16 DIAGNOSIS — L84 Corns and callosities: Secondary | ICD-10-CM

## 2016-03-16 DIAGNOSIS — I10 Essential (primary) hypertension: Secondary | ICD-10-CM | POA: Diagnosis not present

## 2016-03-16 NOTE — Patient Instructions (Signed)
Please return for a follow-up visit in 6 months.

## 2016-03-18 DIAGNOSIS — L84 Corns and callosities: Secondary | ICD-10-CM | POA: Insufficient documentation

## 2016-03-18 NOTE — Assessment & Plan Note (Signed)
BP Readings from Last 3 Encounters:  03/16/16 111/75  01/21/16 127/82  09/23/15 130/70    Lab Results  Component Value Date   NA 144 12/06/2014   K 3.6 12/06/2014   CREATININE 1.10 12/06/2014    Assessment: Blood pressure control:  under goal (<140/90) Comments: Currently on Amlodipine 5 mg qd and reports compliance with medication.   Plan: Medications:  continue current medications Educational resources provided:  Educated about healthy eating and exercise.

## 2016-03-18 NOTE — Assessment & Plan Note (Signed)
A: Patient follow up with podiatrist. Reports having calluses on the plantar surface of the 1st and 2nd toes of his left foot from wearing uncushioned boots. States his calluses are normally trimmed by a podiatrist but he attempted to trim them on his own a few days ago. He is concerned that he might have trimmed the skin a little too much. However, no open wound/ cut or signs of infection on exam. Skin on his feet appeared very dry.    P: -Advised patient to use a moisturizer on his feet and follow up with his podiatrist for future trimmings -Advised him to wear comfortable tennis shoes

## 2016-03-18 NOTE — Assessment & Plan Note (Signed)
A: Patient has a history of hematochezia secondary to radiation proctatitis. He denies having any recurrent hematochezia and states he has been taking iron pills daily. Denies having any malaise/ fatigue, lightheadedness, dyspnea on exertion, or CP.   P: -CTM

## 2016-03-18 NOTE — Assessment & Plan Note (Signed)
A: Patient underwent I&D of an epidermoid cyst on his back during his previous visit on 01/21/16. States the area has healed very well and he cannot feel a cyst on his back anymore. No cyst in the area noted on exam today.   P: -Resolved

## 2016-03-18 NOTE — Progress Notes (Signed)
Internal Medicine Clinic Attending  Case discussed with Dr. Rathoreat the time of the visit. We reviewed the resident's history and exam and pertinent patient test results. I agree with the assessment, diagnosis, and plan of care documented in the resident's note.  

## 2016-03-18 NOTE — Progress Notes (Signed)
Patient ID: William Jones, male   DOB: 1951-10-02, 65 y.o.   MRN: [id number]   Subjective:   Patient ID: William Jones male   DOB: 1950-10-11 65 y.o.   MRN: [id number]  HPI: [name] is a 65 y.o. M with a PMHx of conditions listed below presenting to the clinic for a follow-up of an epidermoid cyst on his back s/p I&D on 01/21/16. Please see problem based charting for the status of the patient's chronic medical conditions.     Past Medical History  Diagnosis Date  . History of frostbite   . Prostate cancer ([place]) 03/05/14    Gleason 7, volume 45 gm  . Iron deficiency anemia   . Peripheral neuropathy ([place])   . Duodenitis   . Thrombocytopenia ([place])   . H/O: substance abuse   . S/P radiation therapy 09/18/14-11/15/14    prostate/ 7800Gy/40sessions  . Essential hypertension 07/16/2015  . History of radiation therapy nov 2015 to feb 2016   Current Outpatient Prescriptions  Medication Sig Dispense Refill  . amLODipine (NORVASC) 5 MG tablet Take 1 tablet (5 mg total) by mouth daily. 90 tablet 3  . docusate sodium (COLACE) 50 MG capsule Take 50 mg by mouth daily as needed for mild constipation.    . ferrous sulfate 325 (65 FE) MG tablet Take 1 tablet (325 mg total) by mouth daily with breakfast. 90 tablet 3   No current facility-administered medications for this visit.   Family History  Problem Relation Age of Onset  . Stroke Maternal Uncle     70s-80s  . Kidney failure Brother 52    has been on HD since age 73    Social History   Social History  . Marital Status: Widowed    Spouse Name: N/A  . Number of Children: N/A  . Years of Education: N/A   Occupational History  . retired    Social History Main Topics  . Smoking status: Former Smoker    Types: Cigarettes    Quit date: 10/05/2007  . Smokeless tobacco: Never Used  . Alcohol Use: 0.0 oz/week    0 Standard drinks or equivalent per week     Comment: 2-3 days per week  . Drug Use: No   Comment: per Epic note 2009hx of crack cocaine use 12 years ago per pt, no current marijuana use per pt  . Sexual Activity: Not Currently   Other Topics Concern  . None   Social History Narrative   On disability - frostbite. Used to work for [employer]. Retired in 1990. Has 2 kids. 6th grade.    Review of Systems: Review of Systems  Constitutional: Negative for fever, chills and malaise/fatigue.  HENT: Negative for congestion.   Eyes: Negative for blurred vision and pain.  Respiratory: Negative for cough, shortness of breath and wheezing.   Cardiovascular: Negative for chest pain, palpitations and leg swelling.  Gastrointestinal: Negative for nausea, vomiting, abdominal pain and diarrhea.  Genitourinary: Negative for dysuria and flank pain.  Musculoskeletal: Negative for myalgias and joint pain.  Skin: Negative for itching and rash.  Neurological: Negative for dizziness, sensory change, focal weakness and headaches.   Objective:  Physical Exam: Filed Vitals:   03/16/16 1340  BP: 111/75  Pulse: 67  Temp: 98.1 F (36.7 C)  TempSrc: Oral  Height: 5\' 9"  (1.753 m)  Weight: 162 lb 9.6 oz (73.755 kg)  SpO2: 100%   Physical Exam  Constitutional: He is oriented to person, place,  and time. He appears well-developed and well-nourished. No distress.  HENT:  Head: Normocephalic and atraumatic.  Mouth/Throat: Oropharynx is clear and moist.  Eyes: EOM are normal. Pupils are equal, round, and reactive to light.  Neck: Neck supple. No tracheal deviation present.  Cardiovascular: Normal rate, regular rhythm and intact distal pulses.  Exam reveals no gallop and no friction rub.   No murmur heard. Pulmonary/Chest: Effort normal and breath sounds normal. No respiratory distress. He has no wheezes. He has no rales.  Abdominal: Soft. Bowel sounds are normal. He exhibits no distension. There is no tenderness. There is no guarding.  Musculoskeletal: Normal range of motion. He exhibits no  edema.  Neurological: He is alert and oriented to person, place, and time.  Skin: Skin is warm and dry. No rash noted. No erythema.  Trimmed calluses on the plantar surface of the 1st and 2nd toes of the left foot. No open wound/ cut, erythema, increased warmth, swelling, or drainage.  Skin on feet appears very dry with cracked heels.    Assessment & Plan:

## 2016-05-04 ENCOUNTER — Encounter: Payer: Self-pay | Admitting: Sports Medicine

## 2016-05-04 ENCOUNTER — Ambulatory Visit: Payer: Medicare Other | Admitting: Podiatry

## 2016-05-04 ENCOUNTER — Ambulatory Visit (INDEPENDENT_AMBULATORY_CARE_PROVIDER_SITE_OTHER): Payer: Medicare Other | Admitting: Sports Medicine

## 2016-05-04 DIAGNOSIS — B351 Tinea unguium: Secondary | ICD-10-CM | POA: Diagnosis not present

## 2016-05-04 DIAGNOSIS — Q828 Other specified congenital malformations of skin: Secondary | ICD-10-CM | POA: Diagnosis not present

## 2016-05-04 DIAGNOSIS — M79676 Pain in unspecified toe(s): Secondary | ICD-10-CM

## 2016-05-04 NOTE — Progress Notes (Signed)
Subjective: William Jones is a 65 y.o. male patient seen today in office with complaint of painful callus and thickened and elongated toenails; unable to trim. Patient denies history of Diabetes, Neuropathy, or Vascular disease. Admits to history of frost bit several years ago L>R. Patient has no other pedal complaints at this time.   Patient Active Problem List   Diagnosis Date Noted  . Foot callus 03/18/2016  . Radiation proctitis 09/24/2015  . Hematochezia 07/16/2015  . Essential hypertension 07/16/2015  . Prostate cancer ([place]) 03/20/2014  . Duodenitis 01/23/2014  . Erectile dysfunction 01/23/2014  . Iron deficiency anemia 01/08/2014  . History of smoking 01/08/2014  . Alcohol abuse, in remission 01/08/2014    Current Outpatient Prescriptions on File Prior to Visit  Medication Sig Dispense Refill  . amLODipine (NORVASC) 5 MG tablet Take 1 tablet (5 mg total) by mouth daily. 90 tablet 3  . docusate sodium (COLACE) 50 MG capsule Take 50 mg by mouth daily as needed for mild constipation.    . ferrous sulfate 325 (65 FE) MG tablet Take 1 tablet (325 mg total) by mouth daily with breakfast. 90 tablet 3   No current facility-administered medications on file prior to visit.     No Known Allergies  Objective: Physical Exam  General: Well developed, nourished, no acute distress, awake, alert and oriented x 3  Vascular: Dorsalis pedis artery 2/4 bilateral, Posterior tibial artery 1/4 bilateral, skin temperature warm to warm proximal to distal bilateral lower extremities, no varicosities, pedal hair present bilateral.  Neurological: Gross sensation present via light touch bilateral.   Dermatological: Skin is warm, dry, and supple bilateral, Nails 1-5 on right and 1,3,and 4 on left are tender, long, thick, and discolored with mild subungal debris, no webspace macerations present bilateral, no open lesions present bilateral, + hyperkeratotic tissue present left 2nd toe and medial  hallux on left. No signs of infection bilateral.  Musculoskeletal: Asymptomatic bunion and hammertoe boney deformities noted bilateral. Muscular strength within normal limits without painon range of motion. No pain with calf compression bilateral.  Assessment and Plan:  Problem List Items Addressed This Visit    None    Visit Diagnoses    Dermatophytosis of nail    -  Primary   Pain of toe, unspecified laterality       Porokeratosis          -Examined patient.  -Discussed treatment options for painful mycotic nails. -Mechanically debrided keratosis x 2 using sterile blade and reduced mycotic nails with sterile nail nipper and dremel nail file without incident. -Patient to return in 3 months for follow up evaluation or sooner if symptoms worsen.  William Jones, DPM

## 2016-06-22 ENCOUNTER — Ambulatory Visit (INDEPENDENT_AMBULATORY_CARE_PROVIDER_SITE_OTHER): Payer: Medicare Other | Admitting: Internal Medicine

## 2016-06-22 ENCOUNTER — Encounter: Payer: Self-pay | Admitting: Internal Medicine

## 2016-06-22 VITALS — BP 119/90 | HR 97 | Temp 98.0°F | Wt 172.1 lb

## 2016-06-22 DIAGNOSIS — I10 Essential (primary) hypertension: Secondary | ICD-10-CM | POA: Diagnosis not present

## 2016-06-22 DIAGNOSIS — Z23 Encounter for immunization: Secondary | ICD-10-CM | POA: Diagnosis not present

## 2016-06-22 DIAGNOSIS — Z0000 Encounter for general adult medical examination without abnormal findings: Secondary | ICD-10-CM

## 2016-06-22 DIAGNOSIS — M65841 Other synovitis and tenosynovitis, right hand: Secondary | ICD-10-CM | POA: Diagnosis not present

## 2016-06-22 DIAGNOSIS — M659 Synovitis and tenosynovitis, unspecified: Secondary | ICD-10-CM

## 2016-06-22 DIAGNOSIS — Z87891 Personal history of nicotine dependence: Secondary | ICD-10-CM | POA: Diagnosis not present

## 2016-06-22 DIAGNOSIS — M65842 Other synovitis and tenosynovitis, left hand: Secondary | ICD-10-CM

## 2016-06-22 MED ORDER — MELOXICAM 7.5 MG PO TABS: 7.5000 mg | ORAL_TABLET | Freq: Every day | ORAL | 0 refills | Status: DC

## 2016-06-22 NOTE — Patient Instructions (Signed)
Trigger Finger °Trigger finger (digital tendinitis and stenosing tenosynovitis) is a common disorder that causes an often painful catching of the fingers or thumb. It occurs as a clicking, snapping, or locking of a finger in the palm of the hand. This is caused by a problem with the tendons that flex or bend the fingers sliding smoothly through their sheaths. The condition may occur in any finger or a couple fingers at the same time.  °The finger may lock with the finger curled or suddenly straighten out with a snap. This is more common in patients with rheumatoid arthritis and diabetes. Left untreated, the condition may get worse to the point where the finger becomes locked in flexion, like making a fist, or less commonly locked with the finger straightened out. °CAUSES  °· Inflammation and scarring that lead to swelling around the tendon sheath. °· Repeated or forceful movements. °· Rheumatoid arthritis, an autoimmune disease that affects joints. °· Gout. °· Diabetes mellitus. °SIGNS AND SYMPTOMS °· Soreness and swelling of your finger. °· A painful clicking or snapping as you bend and straighten your finger. °DIAGNOSIS  °Your health care provider will do a physical exam of your finger to diagnose trigger finger. °TREATMENT  °· Splinting for 6-8 weeks may be helpful. °· Nonsteroidal anti-inflammatory medicines (NSAIDs) can help to relieve the pain and inflammation. °· Cortisone injections, along with splinting, may speed up recovery. Several injections may be required. Cortisone may give relief after one injection. °· Surgery is another treatment that may be used if conservative treatments do not work. Surgery can be minor, without incisions (a cut does not have to be made), and can be done with a needle through the skin. °· Other surgical choices involve an open procedure in which the surgeon opens the hand through a small incision and cuts the pulley so the tendon can again slide smoothly. Your hand will still  work fine. °HOME CARE INSTRUCTIONS °· Apply ice to the injured area, twice per day: °¨ Put ice in a plastic bag. °¨ Place a towel between your skin and the bag. °¨ Leave the ice on for 20 minutes, 3-4 times a day. °· Rest your hand often. °MAKE SURE YOU:  °· Understand these instructions. °· Will watch your condition. °· Will get help right away if you are not doing well or get worse. °  °This information is not intended to replace advice given to you by your health care provider. Make sure you discuss any questions you have with your health care provider. °  °Document Released: 07/10/2004 Document Revised: 05/23/2013 Document Reviewed: 02/20/2013 °Elsevier Interactive Patient Education ©2016 Elsevier Inc. ° °

## 2016-06-23 DIAGNOSIS — Z0000 Encounter for general adult medical examination without abnormal findings: Secondary | ICD-10-CM | POA: Insufficient documentation

## 2016-06-23 DIAGNOSIS — M65949 Unspecified synovitis and tenosynovitis, unspecified hand: Secondary | ICD-10-CM | POA: Insufficient documentation

## 2016-06-23 DIAGNOSIS — M659 Synovitis and tenosynovitis, unspecified: Secondary | ICD-10-CM | POA: Insufficient documentation

## 2016-06-23 NOTE — Assessment & Plan Note (Signed)
History of present illness Patient reports having locking of the ring finger on his right hand for the past 2 months and thumb on his left hand for the past 1 month. States his ring finger is not painful but the thumb is causing him some pain. States this happens whenever he tries to move his fingers. Denies having any fevers, chills, or rash. Denies having any swelling in his joints. Denies having any other joint pain. Denies any alcohol intake for the past 9 months and states he eats red meat only on occasion.  Assessment Flexor tenosynovitis of left thumb and right ring finger. Locking of the distal interphalangeal joint of the L thumb and proximal interphalangeal joint of the R ring finger noted during active flexion and extension. No swelling, erythema, or increased warmth of joints noted. As such, rheumatoid arthritis less likely. De Quervain's tenosynovitis less likely as Finkelstein's test was negative.  Plan -Two-week trial of meloxicam 7.5 mg daily -Splinting -If patient continues to have symptoms at next visit, consider referral to sports med for possible corticosteroid injections

## 2016-06-23 NOTE — Progress Notes (Signed)
   CC: Thumb pain  HPI:  [name] is a 65 y.o. male with a past medical history of conditions listed below presenting to the clinic to discuss pain in his left thumb. Other medical conditions including hypertension were also discussed during this visit. Please see problem based charting for the status of the patient's current and chronic medical conditions.  Past Medical History:  Diagnosis Date  . Duodenitis   . Essential hypertension 07/16/2015  . H/O: substance abuse   . History of frostbite   . History of radiation therapy nov 2015 to feb 2016  . Iron deficiency anemia   . Peripheral neuropathy ([place])   . Prostate cancer ([place]) 03/05/14   Gleason 7, volume 45 gm  . S/P radiation therapy 09/18/14-11/15/14   prostate/ 7800Gy/40sessions  . Thrombocytopenia ([place])     Review of Systems:  Pertinent positives mentioned in history of present illness. Remainder of all review of systems negative.  Physical Exam:  Vitals:   06/22/16 1527  BP: 119/90  Pulse: 97  Temp: 98 F (36.7 C)  TempSrc: Oral  SpO2: 100%  Weight: 172 lb 1.6 oz (78.1 kg)   Physical Exam  Constitutional: He is oriented to person, place, and time. He appears well-developed and well-nourished. No distress.  HENT:  Head: Normocephalic and atraumatic.  Eyes: EOM are normal. Pupils are equal, round, and reactive to light.  Neck: Neck supple. No tracheal deviation present.  Cardiovascular: Normal rate, regular rhythm and intact distal pulses.  Exam reveals no gallop and no friction rub.   No murmur heard. Pulmonary/Chest: Effort normal and breath sounds normal. No respiratory distress. He has no wheezes. He has no rales.  Abdominal: Soft. Bowel sounds are normal. He exhibits no distension. There is no tenderness. There is no guarding.  Musculoskeletal: He exhibits no edema.  Joint examination:  Left hand - locking of the distal interphalangeal joint noted during active flexion and extension of the  thumb. Remainder of all joints have normal range of motion.  No swelling, erythema, or increased warmth of joints. Finkelstein's test negative.   Right hand - locking of the proximal interphalangeal joint noted during active flexion and extension of the ring finger. Remainder of all joints have normal range of motion. No swelling, erythema, or increased warmth of joints.   Neurological: He is alert and oriented to person, place, and time.  Skin: Skin is warm and dry.    Assessment & Plan:   See Encounters Tab for problem based charting.  Patient discussed with Dr. Dareen Piano

## 2016-06-23 NOTE — Assessment & Plan Note (Signed)
BP Readings from Last 3 Encounters:  06/22/16 119/90  03/16/16 111/75  01/21/16 127/82    Lab Results  Component Value Date   NA 144 12/06/2014   K 3.6 12/06/2014   CREATININE 1.10 12/06/2014    Assessment: Blood pressure control:  below goal (less than 150/90) Comments: Patient is currently taking amlodipine 5 mg daily.  Plan: Medications:  continue current medications Educational resources provided:   Educated patient about healthy eating and exercise.

## 2016-06-23 NOTE — Assessment & Plan Note (Signed)
Influenza vaccine up-to-date per patient. States he got it at [facility] prior to his appointment today.

## 2016-06-30 NOTE — Progress Notes (Signed)
Internal Medicine Clinic Attending  Case discussed with Dr. Rathoreat the time of the visit. We reviewed the resident's history and exam and pertinent patient test results. I agree with the assessment, diagnosis, and plan of care documented in the resident's note.  

## 2016-07-06 ENCOUNTER — Encounter: Payer: Self-pay | Admitting: Internal Medicine

## 2016-07-06 ENCOUNTER — Ambulatory Visit (INDEPENDENT_AMBULATORY_CARE_PROVIDER_SITE_OTHER): Payer: Medicare Other | Admitting: Internal Medicine

## 2016-07-06 VITALS — BP 135/84 | HR 66 | Temp 98.2°F | Ht 69.0 in | Wt 177.7 lb

## 2016-07-06 DIAGNOSIS — M65841 Other synovitis and tenosynovitis, right hand: Secondary | ICD-10-CM

## 2016-07-06 DIAGNOSIS — M65842 Other synovitis and tenosynovitis, left hand: Secondary | ICD-10-CM

## 2016-07-06 DIAGNOSIS — Z87891 Personal history of nicotine dependence: Secondary | ICD-10-CM

## 2016-07-06 DIAGNOSIS — M65949 Unspecified synovitis and tenosynovitis, unspecified hand: Secondary | ICD-10-CM

## 2016-07-06 DIAGNOSIS — M659 Synovitis and tenosynovitis, unspecified: Secondary | ICD-10-CM

## 2016-07-06 MED ORDER — MELOXICAM 7.5 MG PO TABS: 7.5000 mg | ORAL_TABLET | Freq: Every day | ORAL | 0 refills | Status: DC

## 2016-07-06 NOTE — Progress Notes (Signed)
   CC: Patient is here for a 2 week follow-up of his digit flexor tenosynovitis.   HPI:  [name] is a 65 y.o. for a 2 week follow-up of his L thumb and R ring finger flexor tenosynovitis. Please see problem based charting for the status of the patient's chronic and current medical conditions.   Past Medical History:  Diagnosis Date  . Duodenitis   . Essential hypertension 07/16/2015  . H/O: substance abuse   . History of frostbite   . History of radiation therapy nov 2015 to feb 2016  . Iron deficiency anemia   . Peripheral neuropathy ([place])   . Prostate cancer ([place]) 03/05/14   Gleason 7, volume 45 gm  . S/P radiation therapy 09/18/14-11/15/14   prostate/ 7800Gy/40sessions  . Thrombocytopenia ([place])     Review of Systems:  Pertinent positives mentioned in HPI. Remainder of all ROS negative.   Physical Exam:  Vitals:   07/06/16 0948  BP: 135/84  Pulse: 66  Temp: 98.2 F (36.8 C)  TempSrc: Oral  SpO2: 100%  Weight: 177 lb 11.2 oz (80.6 kg)  Height: 5\' 9"  (1.753 m)   Physical Exam  Constitutional: He is oriented to person, place, and time. He appears well-developed and well-nourished. No distress.  HENT:  Head: Normocephalic and atraumatic.  Eyes: EOM are normal. Pupils are equal, round, and reactive to light.  Neck: Neck supple. No tracheal deviation present.  Cardiovascular: Normal rate, regular rhythm and intact distal pulses.  Exam reveals no gallop and no friction rub.   No murmur heard. Pulmonary/Chest: Effort normal and breath sounds normal. No respiratory distress. He has no wheezes. He has no rales.  Abdominal: Soft. Bowel sounds are normal. He exhibits no distension. There is no tenderness. There is no guarding.  Musculoskeletal: He exhibits no edema.   Joint examination:  Left hand - minimal intermittent locking of the distal interphalangeal joint noted during active flexion and extension of the thumb (improved since last visit). Remainder of  all joints have normal range of motion.  No swelling, erythema, or increased warmth of joints.   Right hand - minimal intermittent locking of the proximal interphalangeal joint noted during active flexion and extension of the ring finger (improved since last visit). Remainder of all joints have normal range of motion. No swelling, erythema, or increased warmth of joints.    Neurological: He is alert and oriented to person, place, and time.  Skin: Skin is warm and dry.   Assessment & Plan:   See Encounters Tab for problem based charting.  Patient discussed with Dr. Lynnae January

## 2016-07-06 NOTE — Patient Instructions (Signed)
William Jones it was nice to see you today.  -Use splints on your thumb and ring finger, which can be purchased at the drug store.  -Continue taking Meloxicam for another 1 week.  -Return to the clinic in 6 months for a regular checkup or sooner if your symptoms get worse or do not resolve.

## 2016-07-06 NOTE — Assessment & Plan Note (Signed)
HPI Patient reports significant improvement of his symptoms after taking Meloxicam 7.5 mg daily for 2 weeks. States the pain in L thumb has resolved. States the locking of his joints is now much better and only happens intermittently. He has not tried splinting yet.   A Flexor tenosynovitis of L thumb and R ring finger. Locking of joints is only minimal and intermittent now (much improved since last exam 2 weeks ago).   P -Continue Meloxicam for another 7 days -Advised patient to try splinting to immobilize the joints  -RTC if symptoms do not resolve or become worse  -If patient continues to have symptoms at future visit, consider referral to sports med for possible intraarticular corticosteroid injections

## 2016-07-07 ENCOUNTER — Ambulatory Visit (INDEPENDENT_AMBULATORY_CARE_PROVIDER_SITE_OTHER): Payer: Medicare Other | Admitting: Podiatry

## 2016-07-07 ENCOUNTER — Encounter: Payer: Self-pay | Admitting: Podiatry

## 2016-07-07 VITALS — BP 140/74 | HR 84 | Resp 18

## 2016-07-07 DIAGNOSIS — Q828 Other specified congenital malformations of skin: Secondary | ICD-10-CM

## 2016-07-07 DIAGNOSIS — M79676 Pain in unspecified toe(s): Secondary | ICD-10-CM

## 2016-07-07 DIAGNOSIS — B351 Tinea unguium: Secondary | ICD-10-CM

## 2016-07-07 NOTE — Patient Instructions (Signed)
Diabetes and Foot Care Diabetes may cause you to have problems because of poor blood supply (circulation) to your feet and legs. This may cause the skin on your feet to become thinner, break easier, and heal more slowly. Your skin may become dry, and the skin may peel and crack. You may also have nerve damage in your legs and feet causing decreased feeling in them. You may not notice minor injuries to your feet that could lead to infections or more serious problems. Taking care of your feet is one of the most important things you can do for yourself.  HOME CARE INSTRUCTIONS  Wear shoes at all times, even in the house. Do not go barefoot. Bare feet are easily injured.  Check your feet daily for blisters, cuts, and redness. If you cannot see the bottom of your feet, use a mirror or ask someone for help.  Wash your feet with warm water (do not use hot water) and mild soap. Then pat your feet and the areas between your toes until they are completely dry. Do not soak your feet as this can dry your skin.  Apply a moisturizing lotion or petroleum jelly (that does not contain alcohol and is unscented) to the skin on your feet and to dry, brittle toenails. Do not apply lotion between your toes.  Trim your toenails straight across. Do not dig under them or around the cuticle. File the edges of your nails with an emery board or nail file.  Do not cut corns or calluses or try to remove them with medicine.  Wear clean socks or stockings every day. Make sure they are not too tight. Do not wear knee-high stockings since they may decrease blood flow to your legs.  Wear shoes that fit properly and have enough cushioning. To break in new shoes, wear them for just a few hours a day. This prevents you from injuring your feet. Always look in your shoes before you put them on to be sure there are no objects inside.  Do not cross your legs. This may decrease the blood flow to your feet.  If you find a minor scrape,  cut, or break in the skin on your feet, keep it and the skin around it clean and dry. These areas may be cleansed with mild soap and water. Do not cleanse the area with peroxide, alcohol, or iodine.  When you remove an adhesive bandage, be sure not to damage the skin around it.  If you have a wound, look at it several times a day to make sure it is healing.  Do not use heating pads or hot water bottles. They may burn your skin. If you have lost feeling in your feet or legs, you may not know it is happening until it is too late.  Make sure your health care provider performs a complete foot exam at least annually or more often if you have foot problems. Report any cuts, sores, or bruises to your health care provider immediately. SEEK MEDICAL CARE IF:   You have an injury that is not healing.  You have cuts or breaks in the skin.  You have an ingrown nail.  You notice redness on your legs or feet.  You feel burning or tingling in your legs or feet.  You have pain or cramps in your legs and feet.  Your legs or feet are numb.  Your feet always feel cold. SEEK IMMEDIATE MEDICAL CARE IF:   There is increasing redness,   swelling, or pain in or around a wound.  There is a red line that goes up your leg.  Pus is coming from a wound.  You develop a fever or as directed by your health care provider.  You notice a bad smell coming from an ulcer or wound.   This information is not intended to replace advice given to you by your health care provider. Make sure you discuss any questions you have with your health care provider.   Document Released: 09/17/2000 Document Revised: 05/23/2013 Document Reviewed: 02/27/2013 Elsevier Interactive Patient Education 2016 Elsevier Inc.  

## 2016-07-07 NOTE — Progress Notes (Signed)
Internal Medicine Clinic Attending  Case discussed with Dr. Rathoreat the time of the visit. We reviewed the resident's history and exam and pertinent patient test results. I agree with the assessment, diagnosis, and plan of care documented in the resident's note.  

## 2016-07-07 NOTE — Progress Notes (Signed)
   Subjective:    Patient ID: William Jones, male    DOB: 04/06/1951, 65 y.o.   MRN: [id number]  HPI I need my toenails trimmed up and I have a sore spot on my 2nd toe on my left foot that is sore and Dr Amalia Hailey usually  trims on it     Review of Systems  All other systems reviewed and are negative.      Objective:   Physical Exam        Assessment & Plan:

## 2016-07-08 NOTE — Progress Notes (Signed)
Patient ID: William Jones, male   DOB: [birth year], 65 y.o.   MRN: [id number]

## 2016-07-08 NOTE — Progress Notes (Signed)
Patient ID: William Jones, male   DOB: 1950/12/11, 65 y.o.   MRN: [id number]     Subjective: This patient presents again today complaining of painful toenails on the right and left feet and a painful callus.  Objective: Orientated 3 DP pulses 2/4 bilaterally PT pulses 1/4 bilaterally HAV and hammertoes bilaterally Keratoses distal second left toe Symptomatic onychomycoses 6-10 Keratoses 1  Assessment: Symptomatic onychomycoses 6-10 Keratoses 1   Plan: Debridement of toenails 10 mechanically and electrically without any bleeding Debrided keratoses 1 without any bleeding  Reappoint 2 months at patient's request

## 2016-07-13 ENCOUNTER — Ambulatory Visit (INDEPENDENT_AMBULATORY_CARE_PROVIDER_SITE_OTHER): Payer: Medicare Other | Admitting: Internal Medicine

## 2016-07-13 ENCOUNTER — Encounter: Payer: Self-pay | Admitting: Internal Medicine

## 2016-07-13 VITALS — BP 131/79 | HR 80 | Temp 98.7°F | Ht 69.0 in | Wt 173.5 lb

## 2016-07-13 DIAGNOSIS — M65842 Other synovitis and tenosynovitis, left hand: Secondary | ICD-10-CM

## 2016-07-13 DIAGNOSIS — M65841 Other synovitis and tenosynovitis, right hand: Secondary | ICD-10-CM

## 2016-07-13 DIAGNOSIS — Z87891 Personal history of nicotine dependence: Secondary | ICD-10-CM | POA: Diagnosis not present

## 2016-07-13 DIAGNOSIS — M659 Synovitis and tenosynovitis, unspecified: Secondary | ICD-10-CM

## 2016-07-13 NOTE — Assessment & Plan Note (Signed)
Patient with flexor tenosynovitis treated successfully with Meloxicam without unfavorable SE or bleeding. He still presents with intermittent locking of thumb with movement, but has full use of both hands and is pain free.   Plan: --completed Meloxicam therapy --patient advised to f/u if symptoms return

## 2016-07-13 NOTE — Progress Notes (Signed)
   CC: thumb locking and pain  HPI:  [name] is a 65 y.o. with a PMH of iron-deficiency anemia 2/2 GI AVM's and radiation proctitis, HTN, h/o prostate cancer s/p chemo and radiation, presenting to clinic for follow up for R ring finger and L thumb flexor tenosynovitis.   He has been treated with 3 week total of Meloxicam 7.5mg  daily. He reports resolution of pain and ability to fully use both hands again. Last dose was this morning. He has not been able to get splints yet due to cost. He has not had any side effects with meloxicam therapy nor any bleeding.  Please see problem based Assessment and Plan for status of patients chronic conditions.  Past Medical History:  Diagnosis Date  . Duodenitis   . Essential hypertension 07/16/2015  . H/O: substance abuse   . History of frostbite   . History of radiation therapy nov 2015 to feb 2016  . Iron deficiency anemia   . Peripheral neuropathy ([place])   . Prostate cancer ([place]) 03/05/14   Gleason 7, volume 45 gm  . S/P radiation therapy 09/18/14-11/15/14   prostate/ 7800Gy/40sessions  . Thrombocytopenia ([place])     Review of Systems:   Review of Systems  Constitutional: Negative for fever and malaise/fatigue.  Respiratory: Negative for cough, hemoptysis and shortness of breath.   Cardiovascular: Negative for chest pain and leg swelling.  Gastrointestinal: Positive for constipation (intermittent 2/2 FE supplement). Negative for abdominal pain, blood in stool, heartburn, melena and nausea.  Genitourinary: Negative for hematuria.    Physical Exam:  Vitals:   07/13/16 1417  BP: 131/79  Pulse: 80  Temp: 98.7 F (37.1 C)  TempSrc: Oral  SpO2: 100%  Weight: 173 lb 8 oz (78.7 kg)  Height: 5\' 9"  (1.753 m)   Physical Exam Constitutional: NAD, pleasant CV: RRR, no murmurs, rubs or gallops Resp: CTAB, no crackles or wheezing appreciated, no increased work of breathing Abd: soft, +BS, nondistended, nontender Ext: minimal  pitting edema bilaterally, no rash, no bruising Hands: Full range of motion in bilateral fingers and thumbs; intermittent locking of L thumb   Assessment & Plan:   See Encounters Tab for problem based charting.   Patient seen with Dr. Abelardo Diesel, MD Internal Medicine PGY1

## 2016-07-13 NOTE — Patient Instructions (Signed)
I'm glad you're doing better!  We'll keep a watch on your thumb and ring finger for now. If the pain returns, please come into clinic and we will be happy to see you.  Continue doing a great job with your blood pressure.  Try and increase your activity, at least 30 minutes 4-5 times a week of walking or moderate exercise.   We will set up an appointment with your PCP to see you in about 6 months for a regular checkup.

## 2016-07-14 NOTE — Progress Notes (Signed)
Internal Medicine Clinic Attending  I saw and evaluated the patient.  I personally confirmed the key portions of the history and exam documented by Dr. Svalina and I reviewed pertinent patient test results.  The assessment, diagnosis, and plan were formulated together and I agree with the documentation in the resident's note.  

## 2016-09-03 DIAGNOSIS — Z8546 Personal history of malignant neoplasm of prostate: Secondary | ICD-10-CM | POA: Diagnosis not present

## 2016-09-08 ENCOUNTER — Encounter: Payer: Self-pay | Admitting: Podiatry

## 2016-09-08 ENCOUNTER — Ambulatory Visit (INDEPENDENT_AMBULATORY_CARE_PROVIDER_SITE_OTHER): Payer: Medicare Other | Admitting: Podiatry

## 2016-09-08 DIAGNOSIS — M79671 Pain in right foot: Secondary | ICD-10-CM

## 2016-09-08 DIAGNOSIS — L84 Corns and callosities: Secondary | ICD-10-CM

## 2016-09-08 DIAGNOSIS — L603 Nail dystrophy: Secondary | ICD-10-CM

## 2016-09-08 DIAGNOSIS — B351 Tinea unguium: Secondary | ICD-10-CM

## 2016-09-08 DIAGNOSIS — M79609 Pain in unspecified limb: Secondary | ICD-10-CM

## 2016-09-08 DIAGNOSIS — M79672 Pain in left foot: Secondary | ICD-10-CM

## 2016-09-08 DIAGNOSIS — L851 Acquired keratosis [keratoderma] palmaris et plantaris: Secondary | ICD-10-CM

## 2016-09-08 DIAGNOSIS — L608 Other nail disorders: Secondary | ICD-10-CM

## 2016-09-09 ENCOUNTER — Ambulatory Visit: Payer: Medicare Other | Admitting: Podiatry

## 2016-09-09 NOTE — Progress Notes (Signed)
SUBJECTIVE Patient  presents to office today complaining of elongated, thickened nails. Pain while ambulating in shoes. Patient is unable to trim their own nails.  Patient also complains of painful callus lesions to the left foot weightbearing surfaces. Patient states that they hurt in shoe gear and is unable to walk without pain.  OBJECTIVE General Patient is awake, alert, and oriented x 3 and in no acute distress. Derm hyperkeratotic callus lesions noted to the weightbearing surfaces of the left forefoot 3. Skin is dry and supple bilateral. Negative open lesions or macerations. Remaining integument unremarkable. Nails are tender, long, thickened and dystrophic with subungual debris, consistent with onychomycosis, 1-5 bilateral. No signs of infection noted. Vasc  DP and PT pedal pulses palpable bilaterally. Temperature gradient within normal limits.  Neuro Epicritic and protective threshold sensation diminished bilaterally.  Musculoskeletal Exam No symptomatic pedal deformities noted bilateral. Muscular strength within normal limits.  ASSESSMENT 1. Onychodystrophic nails 1-5 bilateral with hyperkeratosis of nails.  2. Onychomycosis of nail due to dermatophyte bilateral 3. Pain in foot bilateral 4. Painful callus lesions weightbearing surface of the left forefoot 3  PLAN OF CARE 1. Patient evaluated today.  2. Instructed to maintain good pedal hygiene and foot care.  3. Mechanical debridement of nails 1-5 bilaterally performed using a nail nipper. Filed with dremel without incident.  4. Excisional debridement of the hyperkeratotic callus lesions was performed using a chisel blade without incident. 5. Return to clinic in 3 mos.    Edrick Kins, DPM

## 2016-09-10 DIAGNOSIS — C61 Malignant neoplasm of prostate: Secondary | ICD-10-CM | POA: Diagnosis not present

## 2016-09-13 ENCOUNTER — Ambulatory Visit (INDEPENDENT_AMBULATORY_CARE_PROVIDER_SITE_OTHER): Payer: Medicare Other | Admitting: Internal Medicine

## 2016-09-13 VITALS — BP 152/88 | HR 81 | Temp 97.5°F | Wt 178.1 lb

## 2016-09-13 DIAGNOSIS — K629 Disease of anus and rectum, unspecified: Secondary | ICD-10-CM | POA: Diagnosis present

## 2016-09-13 DIAGNOSIS — Z8546 Personal history of malignant neoplasm of prostate: Secondary | ICD-10-CM | POA: Diagnosis not present

## 2016-09-13 DIAGNOSIS — K6289 Other specified diseases of anus and rectum: Secondary | ICD-10-CM

## 2016-09-13 DIAGNOSIS — Z923 Personal history of irradiation: Secondary | ICD-10-CM | POA: Diagnosis not present

## 2016-09-13 DIAGNOSIS — K644 Residual hemorrhoidal skin tags: Secondary | ICD-10-CM

## 2016-09-13 DIAGNOSIS — Z87891 Personal history of nicotine dependence: Secondary | ICD-10-CM

## 2016-09-13 NOTE — Patient Instructions (Signed)
Please follow up with Dr. Benson Norway with Gastroenterology. [phone]

## 2016-09-15 DIAGNOSIS — K6289 Other specified diseases of anus and rectum: Secondary | ICD-10-CM | POA: Insufficient documentation

## 2016-09-15 DIAGNOSIS — K645 Perianal venous thrombosis: Secondary | ICD-10-CM | POA: Diagnosis not present

## 2016-09-15 HISTORY — DX: Other specified diseases of anus and rectum: K62.89

## 2016-09-15 NOTE — Progress Notes (Signed)
   CC: rectal mass  HPI:  William Jones is a 65 y.o. with past medical history as outlined below who presents to clinic for rectal mass is noted in the past 3 and half weeks. Mass is nontender without any drainage. Denies blood in his stools, constipation, weight loss, and night sweats. He states that he will normally have to push the hemorrhoids back in after a bowel movement which is how he noticed the rectal mass. He has never had anything like this before. Has a history of radiation to his pelvic wall for prostate cancer.  Past Medical History:  Diagnosis Date  . Duodenitis   . Essential hypertension 07/16/2015  . H/O: substance abuse   . History of frostbite   . History of radiation therapy nov 2015 to feb 2016  . Iron deficiency anemia   . Peripheral neuropathy ([place])   . Prostate cancer ([place]) 03/05/14   Gleason 7, volume 45 gm  . S/P radiation therapy 09/18/14-11/15/14   prostate/ 7800Gy/40sessions  . Thrombocytopenia ([place])     Review of Systems:  Weight stable, negative for dark or bloody stools. Neg for fevers and NS.   Physical Exam:  Vitals:   09/13/16 1450  BP: (!) 152/88  Pulse: 81  Temp: 97.5 F (36.4 C)  TempSrc: Oral  SpO2: 100%  Weight: 178 lb 1.6 oz (80.8 kg)   Physical Exam  Constitutional: he is oriented to person, place, and time. She appears well-developed and well-nourished. No distress.  HENT:  Head: Normocephalic and atraumatic.  Nose: Nose normal.  GU: rectal exam reveals external hemorrhoids. 0.5 inch in diameter firm, non tender mass at 9 o'clock that is on the rim of the rectum. Neg for skin breaks or signs of bleeding.   Assessment & Plan:   See Encounters Tab for problem based charting.  Patient discussed with Dr. Lynnae January \

## 2016-09-15 NOTE — Assessment & Plan Note (Signed)
Assessment: Patient presenting with a rectal mass that he's noticed for the past 3 weeks. Rectal mass has not grown in size and is nontender without any drainage. Mass could possibly be a cyst versus polyp.  Plan: Referred to Dr. Benson Norway with gastroenterology for possible biopsy or removal.

## 2016-09-17 NOTE — Progress Notes (Signed)
Internal Medicine Clinic Attending  Case discussed with Dr. Truong at the time of the visit.  We reviewed the resident's history and exam and pertinent patient test results.  I agree with the assessment, diagnosis, and plan of care documented in the resident's note.  

## 2016-11-01 ENCOUNTER — Other Ambulatory Visit: Payer: Self-pay | Admitting: Internal Medicine

## 2016-11-03 ENCOUNTER — Encounter: Payer: Self-pay | Admitting: *Deleted

## 2016-11-10 ENCOUNTER — Ambulatory Visit (INDEPENDENT_AMBULATORY_CARE_PROVIDER_SITE_OTHER): Payer: Medicare Other | Admitting: Podiatry

## 2016-11-10 ENCOUNTER — Encounter: Payer: Self-pay | Admitting: Podiatry

## 2016-11-10 DIAGNOSIS — L603 Nail dystrophy: Secondary | ICD-10-CM

## 2016-11-10 DIAGNOSIS — M79672 Pain in left foot: Secondary | ICD-10-CM

## 2016-11-10 DIAGNOSIS — B351 Tinea unguium: Secondary | ICD-10-CM

## 2016-11-10 DIAGNOSIS — M79671 Pain in right foot: Secondary | ICD-10-CM

## 2016-11-10 DIAGNOSIS — M79609 Pain in unspecified limb: Secondary | ICD-10-CM | POA: Diagnosis not present

## 2016-11-10 DIAGNOSIS — L608 Other nail disorders: Secondary | ICD-10-CM | POA: Diagnosis not present

## 2016-11-10 DIAGNOSIS — L84 Corns and callosities: Secondary | ICD-10-CM | POA: Diagnosis not present

## 2016-11-10 DIAGNOSIS — L851 Acquired keratosis [keratoderma] palmaris et plantaris: Secondary | ICD-10-CM

## 2016-11-24 NOTE — Progress Notes (Signed)
   SUBJECTIVE Patient  presents to office today complaining of elongated, thickened nails. Pain while ambulating in shoes. Patient is unable to trim their own nails.   OBJECTIVE General Patient is awake, alert, and oriented x 3 and in no acute distress. Derm painful symptomatic callus lesions noted 2 to the left foot. Skin is dry and supple bilateral. Negative open lesions or macerations. Remaining integument unremarkable. Nails are tender, long, thickened and dystrophic with subungual debris, consistent with onychomycosis, 1-5 bilateral. No signs of infection noted. Vasc  DP and PT pedal pulses palpable bilaterally. Temperature gradient within normal limits.  Neuro Epicritic and protective threshold sensation diminished bilaterally.  Musculoskeletal Exam No symptomatic pedal deformities noted bilateral. Muscular strength within normal limits.  ASSESSMENT 1. Onychodystrophic nails 1-5 bilateral with hyperkeratosis of nails.  2. Onychomycosis of nail due to dermatophyte bilateral 3. Pain in foot bilateral 4. Painful callus lesions weightbearing surfaces left forefoot 2  PLAN OF CARE 1. Patient evaluated today.  2. Instructed to maintain good pedal hygiene and foot care.  3. Mechanical debridement of nails 1-5 bilaterally performed using a nail nipper. Filed with dremel without incident.  4. Excisional debridement of painful callus lesions was performed to the left foot 2 using a chisel blade without incident or bleeding  5. Return to clinic in 3 mos.    Edrick Kins, DPM [facility]  Dr. Edrick Kins, [address]                                        [place], [place] [address]                Office [phone]  Fax [phone]

## 2017-01-10 ENCOUNTER — Ambulatory Visit (INDEPENDENT_AMBULATORY_CARE_PROVIDER_SITE_OTHER): Payer: Medicare Other | Admitting: Podiatry

## 2017-01-10 DIAGNOSIS — M79672 Pain in left foot: Secondary | ICD-10-CM

## 2017-01-10 DIAGNOSIS — L608 Other nail disorders: Secondary | ICD-10-CM

## 2017-01-10 DIAGNOSIS — L851 Acquired keratosis [keratoderma] palmaris et plantaris: Secondary | ICD-10-CM

## 2017-01-10 DIAGNOSIS — L84 Corns and callosities: Secondary | ICD-10-CM

## 2017-01-10 DIAGNOSIS — M79609 Pain in unspecified limb: Principal | ICD-10-CM

## 2017-01-10 DIAGNOSIS — B351 Tinea unguium: Secondary | ICD-10-CM | POA: Diagnosis not present

## 2017-01-10 DIAGNOSIS — M79671 Pain in right foot: Secondary | ICD-10-CM | POA: Diagnosis not present

## 2017-01-10 DIAGNOSIS — L603 Nail dystrophy: Secondary | ICD-10-CM | POA: Diagnosis not present

## 2017-01-11 NOTE — Progress Notes (Signed)
   SUBJECTIVE Patient  presents to office today complaining of elongated, thickened nails and painful callus lesions of bilateral feet. Pain while ambulating in shoes. Patient is unable to trim their own nails.   OBJECTIVE General Patient is awake, alert, and oriented x 3 and in no acute distress. Derm painful symptomatic callus lesions noted  5 to the bilateral feet. Skin is dry and supple bilateral. Negative open lesions or macerations. Remaining integument unremarkable. Nails are tender, long, thickened and dystrophic with subungual debris, consistent with onychomycosis, 1-5 bilateral. No signs of infection noted. Vasc  DP and PT pedal pulses palpable bilaterally. Temperature gradient within normal limits.  Neuro Epicritic and protective threshold sensation diminished bilaterally.  Musculoskeletal Exam Pain on palpation at the keratotic lesions noted. No symptomatic pedal deformities noted bilateral. Muscular strength within normal limits.  ASSESSMENT 1. Onychodystrophic nails 1-5 bilateral with hyperkeratosis of nails.  2. Onychomycosis of nail due to dermatophyte bilateral 3. Pain in foot bilateral 4. Painful callus lesions weightbearing surfaces left forefoot  5  PLAN OF CARE 1. Patient evaluated today.  2. Instructed to maintain good pedal hygiene and foot care.  3. Mechanical debridement of nails 1-5 bilaterally performed using a nail nipper. Filed with dremel without incident.  4. Excisional debridement of painful callus lesions was performed to the bilateral feet x 5 using a chisel blade without incident or bleeding  5. Return to clinic in 3 mos.    Edrick Kins, DPM [facility]  Dr. Edrick Kins, [address]                                        [place], [place] [address]                Office [phone]  Fax [phone]

## 2017-03-14 ENCOUNTER — Encounter: Payer: Self-pay | Admitting: Podiatry

## 2017-03-14 ENCOUNTER — Ambulatory Visit (INDEPENDENT_AMBULATORY_CARE_PROVIDER_SITE_OTHER): Payer: Medicare Other | Admitting: Podiatry

## 2017-03-14 DIAGNOSIS — E0842 Diabetes mellitus due to underlying condition with diabetic polyneuropathy: Secondary | ICD-10-CM

## 2017-03-14 DIAGNOSIS — B351 Tinea unguium: Secondary | ICD-10-CM

## 2017-03-14 DIAGNOSIS — M79676 Pain in unspecified toe(s): Secondary | ICD-10-CM | POA: Diagnosis not present

## 2017-03-14 DIAGNOSIS — C61 Malignant neoplasm of prostate: Secondary | ICD-10-CM | POA: Diagnosis not present

## 2017-03-23 NOTE — Progress Notes (Signed)
   SUBJECTIVE Patient with a history of diabetes mellitus presents to office today complaining of elongated, thickened nails. Pain while ambulating in shoes. Patient is unable to trim their own nails.   OBJECTIVE General Patient is awake, alert, and oriented x 3 and in no acute distress. Derm Skin is dry and supple bilateral. Negative open lesions or macerations. Remaining integument unremarkable. Nails are tender, long, thickened and dystrophic with subungual debris, consistent with onychomycosis, 1-5 bilateral. No signs of infection noted. Vasc  DP and PT pedal pulses palpable bilaterally. Temperature gradient within normal limits.  Neuro Epicritic and protective threshold sensation diminished bilaterally.  Musculoskeletal Exam No symptomatic pedal deformities noted bilateral. Muscular strength within normal limits.  ASSESSMENT 1. Diabetes Mellitus w/ peripheral neuropathy 2. Onychomycosis of nail due to dermatophyte bilateral 3. Pain in foot bilateral  PLAN OF CARE 1. Patient evaluated today. 2. Instructed to maintain good pedal hygiene and foot care. Stressed importance of controlling blood sugar.  3. Mechanical debridement of nails 1-5 bilaterally performed using a nail nipper. Filed with dremel without incident.  4. Return to clinic in 3 mos.     Fergie Sherbert M. Elnor Renovato, DPM [facility]  Dr. Rebacca Votaw M. Kashtyn Jankowski, DPM    [address]                                        [place], [place] [address]                Office [phone]  Fax [phone]       

## 2017-04-19 ENCOUNTER — Encounter: Payer: Self-pay | Admitting: Internal Medicine

## 2017-04-19 ENCOUNTER — Ambulatory Visit (INDEPENDENT_AMBULATORY_CARE_PROVIDER_SITE_OTHER): Payer: Medicare Other | Admitting: Internal Medicine

## 2017-04-19 VITALS — BP 148/101 | HR 61 | Temp 98.7°F | Wt 170.0 lb

## 2017-04-19 DIAGNOSIS — Z79899 Other long term (current) drug therapy: Secondary | ICD-10-CM | POA: Diagnosis not present

## 2017-04-19 DIAGNOSIS — D72819 Decreased white blood cell count, unspecified: Secondary | ICD-10-CM | POA: Diagnosis not present

## 2017-04-19 DIAGNOSIS — Z87891 Personal history of nicotine dependence: Secondary | ICD-10-CM | POA: Diagnosis not present

## 2017-04-19 DIAGNOSIS — K629 Disease of anus and rectum, unspecified: Secondary | ICD-10-CM

## 2017-04-19 DIAGNOSIS — K6289 Other specified diseases of anus and rectum: Secondary | ICD-10-CM

## 2017-04-19 DIAGNOSIS — D509 Iron deficiency anemia, unspecified: Secondary | ICD-10-CM | POA: Diagnosis not present

## 2017-04-19 DIAGNOSIS — L720 Epidermal cyst: Secondary | ICD-10-CM | POA: Diagnosis not present

## 2017-04-19 DIAGNOSIS — I10 Essential (primary) hypertension: Secondary | ICD-10-CM

## 2017-04-19 MED ORDER — AMLODIPINE BESYLATE 10 MG PO TABS: 10.0000 mg | ORAL_TABLET | Freq: Every day | ORAL | 0 refills | Status: DC

## 2017-04-19 NOTE — Patient Instructions (Signed)
William Jones it was nice seeing you today.  Dose of Amlodipine has been increased: Take 10 mg once daily  Return for a follow-up visit in 1 month.

## 2017-04-20 DIAGNOSIS — D72819 Decreased white blood cell count, unspecified: Secondary | ICD-10-CM | POA: Insufficient documentation

## 2017-04-20 LAB — CBC
Hematocrit: 37.5 % (ref 37.5–51.0)
Hemoglobin: 12.3 g/dL — ABNORMAL LOW (ref 13.0–17.7)
MCH: 27.6 pg (ref 26.6–33.0)
MCHC: 32.8 g/dL (ref 31.5–35.7)
MCV: 84 fL (ref 79–97)
Platelets: 245 10*3/uL (ref 150–379)
RBC: 4.46 x10E6/uL (ref 4.14–5.80)
RDW: 15.1 % (ref 12.3–15.4)
WBC: 2.4 10*3/uL — AB (ref 3.4–10.8)

## 2017-04-20 LAB — FERRITIN: Ferritin: 100 ng/mL (ref 30–400)

## 2017-04-20 NOTE — Assessment & Plan Note (Addendum)
CBC without differential done at this visit for follow-up of iron deficiency anemia showing white count 2.4. No signs of infection at this time. HIV and hep C testing were negative in 2015. Testing for acute hepatitis B was also negative in 2015. Patient has lost 7 pounds in the past 6 months but this seems to be intentional as he reports eating healthier and exercising daily at the [facility].  -Repeat CBC with differential at follow-up visit in one month. Pursue further workup if he continues to be leukopenic.  Addendum 04/25/2017: Spoke to the patient over the phone and discussed the results. Encouraged him to come to his appointment with me on 05/17/2017 for a repeat CBC.

## 2017-04-20 NOTE — Assessment & Plan Note (Addendum)
Assessment He has a history of iron deficiency anemia. Colonoscopy done in April 2015 showing hyperplastic colon polyp without evidence of malignancy. EGD showing mild peptic duodenitis. Patient denies having any chest pain, shortness of breath, or dizziness. Reports taking his iron supplement daily. Labs at this visit showing stable hemoglobin of 12.3 and ferritin 100.  Plan -Continue iron supplementation  Addendum 04/25/2017: Spoke to the patient over the phone and discussed the results. He agrees with the treatment plan.

## 2017-04-20 NOTE — Assessment & Plan Note (Addendum)
BP Readings from Last 3 Encounters:  04/19/17 (!) 148/101  09/13/16 (!) 152/88  07/13/16 131/79    Lab Results  Component Value Date   NA 144 12/06/2014   K 3.6 12/06/2014   CREATININE 1.10 12/06/2014    Assessment: Blood pressure control:  above goal Comments: Currently on amlodipine 5 mg daily and reports compliance.  Plan: Medications:  Increase dose to amlodipine 10 mg daily. Educational resources provided:   Educated patient about healthy eating and exercise.  Other plans: Return to clinic in 1 month.

## 2017-04-20 NOTE — Assessment & Plan Note (Signed)
History of present illness Patient is complaining of a "knot" on his left thumb which he noticed 3-4 months ago. He believes it is slowly getting bigger in size. States the area is not painful and he has not noted any drainage. Denies having any fevers, chills, or any joint pains.   Assessment Epidermoid cyst noted on his left thumb. Please see image. The area was nontender to palpation and no erythema or drainage noted. He does have a history of prior epidermoid cyst on his back s/p I&D back in April 2017.  Plan -Advised him to return to the clinic if the cyst keeps getting bigger in size as it may need aspiration/ I&D

## 2017-04-20 NOTE — Assessment & Plan Note (Signed)
Assessment During his prior visit, patient was given a referral to gastroenterology. He has not followed up with them yet. He does have a history of prostate cancer treated with radiation from December 2015 to February 2016. Radiation proctitis was seen on flexible sigmoidoscopy done in December 2016. Colonoscopy done in April 2015 showing a hyperplastic polyp without evidence of malignancy. At present, patient denies noticing any masses in his rectal area and denies having any blood per rectum. States he recently saw [facility] in June 2018 at his next appointment is in December. He reports feeling well. I noticed he had lost 7 pounds in the past 6 months. Patient told me he was intentionally trying to lose weight by eating healthy and exercising daily at the [facility].  Plan -Encouraged him to follow-up with urology

## 2017-04-20 NOTE — Progress Notes (Signed)
   CC: Patient is complaining of a "knot" on his left thumb. History of rectal mass, iron deficiency anemia, and hypertension were also discussed during this visit.  HPI:  Mr.Mckyle F Cerone is a 66 y.o. male with a past medical history of conditions listed below presenting to the clinic complaining of a "knot" on his left thumb. History of rectal mass, iron deficiency anemia, and hypertension were also discussed during this visit. Please see problem based charting for the status of the patient's current and chronic medical conditions.   Past Medical History:  Diagnosis Date  . Duodenitis   . Essential hypertension 07/16/2015  . H/O: substance abuse   . History of frostbite   . History of radiation therapy nov 2015 to feb 2016  . Iron deficiency anemia   . Peripheral neuropathy   . Prostate cancer ([place]) 03/05/14   Gleason 7, volume 45 gm  . S/P radiation therapy 09/18/14-11/15/14   prostate/ 7800Gy/40sessions  . Thrombocytopenia ([place])    Review of Systems: Pertinent positives mentioned in HPI. Remainder of all ROS negative.   Physical Exam:  Vitals:   04/19/17 1314 04/19/17 1337  BP: (!) 155/94 (!) 148/101  Pulse: 67 61  Temp: 98.7 F (37.1 C)   TempSrc: Oral   SpO2: 100%   Weight: 170 lb (77.1 kg)    Physical Exam  Constitutional: He is oriented to person, place, and time. He appears well-developed and well-nourished. No distress.  HENT:  Head: Normocephalic and atraumatic.  Eyes: Right eye exhibits no discharge. Left eye exhibits no discharge.  Cardiovascular: Normal rate, regular rhythm and intact distal pulses.   Pulmonary/Chest: Effort normal and breath sounds normal. No respiratory distress. He has no wheezes. He has no rales.  Abdominal: Soft. Bowel sounds are normal. He exhibits no distension. There is no tenderness.  Musculoskeletal: He exhibits no edema.  An epidermoid cyst noted on his left thumb. The area is nontender to palpation. No erythema or drainage  noted. Please see image.  Neurological: He is alert and oriented to person, place, and time.  Skin: Skin is warm and dry.      Assessment & Plan:   See Encounters Tab for problem based charting.  Patient discussed with Dr. Lynnae January

## 2017-04-21 NOTE — Progress Notes (Signed)
Internal Medicine Clinic Attending  Case discussed with Dr. Rathoreat the time of the visit. We reviewed the resident's history and exam and pertinent patient test results. I agree with the assessment, diagnosis, and plan of care documented in the resident's note.  

## 2017-05-11 DIAGNOSIS — H524 Presbyopia: Secondary | ICD-10-CM | POA: Diagnosis not present

## 2017-05-11 DIAGNOSIS — H52203 Unspecified astigmatism, bilateral: Secondary | ICD-10-CM | POA: Diagnosis not present

## 2017-05-17 ENCOUNTER — Encounter: Payer: Self-pay | Admitting: Internal Medicine

## 2017-05-17 ENCOUNTER — Ambulatory Visit (INDEPENDENT_AMBULATORY_CARE_PROVIDER_SITE_OTHER): Payer: Medicare Other | Admitting: Internal Medicine

## 2017-05-17 VITALS — BP 128/85 | HR 76 | Temp 97.7°F | Ht 71.0 in | Wt 168.2 lb

## 2017-05-17 DIAGNOSIS — I10 Essential (primary) hypertension: Secondary | ICD-10-CM | POA: Diagnosis not present

## 2017-05-17 DIAGNOSIS — W881XXD Exposure to radioactive isotopes, subsequent encounter: Secondary | ICD-10-CM | POA: Diagnosis not present

## 2017-05-17 DIAGNOSIS — Z9221 Personal history of antineoplastic chemotherapy: Secondary | ICD-10-CM

## 2017-05-17 DIAGNOSIS — Z8546 Personal history of malignant neoplasm of prostate: Secondary | ICD-10-CM

## 2017-05-17 DIAGNOSIS — D72819 Decreased white blood cell count, unspecified: Secondary | ICD-10-CM | POA: Diagnosis not present

## 2017-05-17 DIAGNOSIS — K627 Radiation proctitis: Secondary | ICD-10-CM

## 2017-05-17 DIAGNOSIS — Z79899 Other long term (current) drug therapy: Secondary | ICD-10-CM | POA: Diagnosis not present

## 2017-05-17 DIAGNOSIS — Z87891 Personal history of nicotine dependence: Secondary | ICD-10-CM

## 2017-05-17 DIAGNOSIS — Z23 Encounter for immunization: Secondary | ICD-10-CM

## 2017-05-17 DIAGNOSIS — Z0000 Encounter for general adult medical examination without abnormal findings: Secondary | ICD-10-CM

## 2017-05-17 MED ORDER — AMLODIPINE BESYLATE 10 MG PO TABS: 10.0000 mg | ORAL_TABLET | Freq: Every day | ORAL | 3 refills | Status: DC

## 2017-05-17 NOTE — Assessment & Plan Note (Signed)
Tdap vaccine administered at this visit.

## 2017-05-17 NOTE — Assessment & Plan Note (Signed)
BP Readings from Last 3 Encounters:  05/17/17 128/85  04/19/17 (!) 148/101  09/13/16 (!) 152/88    Lab Results  Component Value Date   NA 144 12/06/2014   K 3.6 12/06/2014   CREATININE 1.10 12/06/2014    Assessment: Blood pressure control:  below goal Progress toward BP goal:   improved Comments: Currently on Amlodipine 10 mg daily. He has reduced his dietary salt intake and is exercising regularly.   Plan: Medications:  continue current medications Educational resources provided: brochure (denies need )

## 2017-05-17 NOTE — Patient Instructions (Signed)
William Jones it was nice seeing you today.  -Continue taking Amlodipine  -I have referred you to gastroenterology  -Return for a follow-up in 3 months

## 2017-05-17 NOTE — Assessment & Plan Note (Addendum)
History of present illness Patient reports noticing intermittent bright red blood in his stool on a chronic basis. States sometimes the stool is dark. Does state that he eats a lot of greens. States his "muscles come out" when he strains to defecate and this has been going on for a long period of time. Denies having any fatigue or dizziness. Denies having any pain with defecation.  Assessment He has a history of prostate cancer treated with radiation from December 2015 to February 2016. Colonoscopy done in April 2015 showing a hyperplastic polyp without evidence of malignancy. Capsule endoscopy done in 2015 showing a small nonbleeding distal small bowel AVM. Flexible sigmoidoscope done in December 2016 showing radiation proctitis status post APC.  Patient's hematochezia is likely secondary to radiation proctitis. It is reassuring that hemoglobin checked during his prior visit in 04/2017 was stable at 12.3. It is noted that he has lost approximately 10 pounds in the past 8 months, however, he attributes this to being more physically active by walking and running on a regular basis. Patient was last seen by GI in December 2016 and symptoms of rectal prolapse were mentioned in the note.   Plan -Referral to GI for repeat colonoscopy/ flexible sigmoidoscopy for further evaluation of his hematochezia which is likely secondary to radiation proctitis -He may also need referral to general surgery for further management of rectal prolapse in the future

## 2017-05-17 NOTE — Assessment & Plan Note (Addendum)
Assessment CBC done during previous visit showing white count 2.4. Testing for HIV, hep C, and acute hep B has been negative in the past. Patient has lost approximately 10 pounds in the past 8 months which she attributes to being more physically active. He is now walking and running on a regular basis and reports feeling more energetic.  Plan -Recheck white count  Addendum 05/20/2017: Labs at this visit showing white count 3.0 and absolute neutrophil count 1.0. Patient was diagnosed with prostate cancer in June 2015 and was treated with chemotherapy and radiation during that year. Labs from April 2015 (before treatment) and prior dates showing normal white count. Labs from March 2016 onward (after treatment) showing neutropenia. Neutropenia likely a side effect of chemotherapy.  -Continue to monitor CBC every 6-12 months -Spoke to the patient over the phone and he agrees with the treatment plan

## 2017-05-17 NOTE — Progress Notes (Signed)
   CC:  Patient is here for a regular follow-up. HTN, leukopenia, and hx of radiation proctitis were discussed during this visit.   HPI:  [name] is a 66 y.o. M with a PMhx of conditions listed below presenting to the clinic for a regular follow-up. HTN, leukopenia, and hx of radiation proctitis were discussed during this visit.   Past Medical History:  Diagnosis Date  . Duodenitis   . Essential hypertension 07/16/2015  . H/O: substance abuse   . History of frostbite   . History of radiation therapy nov 2015 to feb 2016  . Iron deficiency anemia   . Peripheral neuropathy   . Prostate cancer ([place]) 03/05/14   Gleason 7, volume 45 gm  . S/P radiation therapy 09/18/14-11/15/14   prostate/ 7800Gy/40sessions  . Thrombocytopenia ([place])    Review of Systems: Pertinent positives mentioned in HPI. Remainder of all ROS negative.   Physical Exam:  Vitals:   05/17/17 1433  BP: 128/85  Pulse: 76  Temp: 97.7 F (36.5 C)  TempSrc: Oral  SpO2: 100%  Weight: 168 lb 3.2 oz (76.3 kg)  Height: 5\' 11"  (1.803 m)   Physical Exam  Constitutional: He is oriented to person, place, and time. He appears well-developed and well-nourished. No distress.  HENT:  Head: Normocephalic and atraumatic.  Eyes: Right eye exhibits no discharge. Left eye exhibits no discharge.  Cardiovascular: Normal rate, regular rhythm and intact distal pulses.  Exam reveals no gallop and no friction rub.   Pulmonary/Chest: Effort normal and breath sounds normal. No respiratory distress. He has no wheezes. He has no rales.  Abdominal: Soft. Bowel sounds are normal. He exhibits no distension. There is no tenderness.  Musculoskeletal: He exhibits no edema.  Neurological: He is alert and oriented to person, place, and time.  Skin: Skin is warm and dry.  Psychiatric: He has a normal mood and affect.    Assessment & Plan:   See Encounters Tab for problem based charting.  Patient discussed with Dr. Lynnae January

## 2017-05-18 ENCOUNTER — Encounter: Payer: Self-pay | Admitting: Podiatry

## 2017-05-18 ENCOUNTER — Ambulatory Visit (INDEPENDENT_AMBULATORY_CARE_PROVIDER_SITE_OTHER): Payer: Medicare Other | Admitting: Podiatry

## 2017-05-18 DIAGNOSIS — L84 Corns and callosities: Secondary | ICD-10-CM | POA: Diagnosis not present

## 2017-05-18 DIAGNOSIS — M79676 Pain in unspecified toe(s): Secondary | ICD-10-CM

## 2017-05-18 DIAGNOSIS — E0842 Diabetes mellitus due to underlying condition with diabetic polyneuropathy: Secondary | ICD-10-CM | POA: Diagnosis not present

## 2017-05-18 DIAGNOSIS — B351 Tinea unguium: Secondary | ICD-10-CM

## 2017-05-18 NOTE — Progress Notes (Signed)
   SUBJECTIVE Patient with a history of diabetes mellitus presents to office today complaining of elongated, thickened nails. Pain while ambulating in shoes. Patient is unable to trim their own nails.   OBJECTIVE General Patient is awake, alert, and oriented x 3 and in no acute distress. Derm hyperkeratotic callus tissue noted to the bilateral feet 3 consistent with porokeratosis and pre-ulcerative callus lesions. Skin is dry and supple bilateral. Negative open lesions or macerations. Remaining integument unremarkable. Nails are tender, long, thickened and dystrophic with subungual debris, consistent with onychomycosis, 1-5 bilateral. No signs of infection noted. Vasc  DP and PT pedal pulses palpable bilaterally. Temperature gradient within normal limits.  Neuro Epicritic and protective threshold sensation diminished bilaterally.  Musculoskeletal Exam No symptomatic pedal deformities noted bilateral. Muscular strength within normal limits.  ASSESSMENT 1. Diabetes Mellitus w/ peripheral neuropathy 2. Onychomycosis of nail due to dermatophyte bilateral 3. Pre-ulcerative calluses bilateral feet 3  PLAN OF CARE 1. Patient evaluated today. 2. Instructed to maintain good pedal hygiene and foot care. Stressed importance of controlling blood sugar.  3. Mechanical debridement of nails 1-5 bilaterally performed using a nail nipper. Filed with dremel without incident.  4. Excisional debridement of the callus lesions was performed using a tissue nipper without incident or bleeding.  5. Return to clinic in 3 mos.     Edrick Kins, DPM [facility]  Dr. Edrick Kins, [place]                                        [place], [place] [address]                Office [phone]  Fax [phone]

## 2017-05-19 LAB — CBC WITH DIFFERENTIAL/PLATELET
Basophils Absolute: 0 10*3/uL (ref 0.0–0.2)
Basos: 1 %
EOS (ABSOLUTE): 0.1 10*3/uL (ref 0.0–0.4)
EOS: 4 %
HEMATOCRIT: 36.2 % — AB (ref 37.5–51.0)
HEMOGLOBIN: 11.7 g/dL — AB (ref 13.0–17.7)
Immature Grans (Abs): 0 10*3/uL (ref 0.0–0.1)
Immature Granulocytes: 0 %
LYMPHS ABS: 1.5 10*3/uL (ref 0.7–3.1)
Lymphs: 49 %
MCH: 27.5 pg (ref 26.6–33.0)
MCHC: 32.3 g/dL (ref 31.5–35.7)
MCV: 85 fL (ref 79–97)
MONOCYTES: 11 %
Monocytes Absolute: 0.3 10*3/uL (ref 0.1–0.9)
NEUTROS ABS: 1 10*3/uL — AB (ref 1.4–7.0)
Neutrophils: 35 %
Platelets: 293 10*3/uL (ref 150–379)
RBC: 4.26 x10E6/uL (ref 4.14–5.80)
RDW: 15 % (ref 12.3–15.4)
WBC: 3 10*3/uL — ABNORMAL LOW (ref 3.4–10.8)

## 2017-05-20 NOTE — Progress Notes (Signed)
Internal Medicine Clinic Attending  Case discussed with Dr. Rathoreat the time of the visit. We reviewed the resident's history and exam and pertinent patient test results. I agree with the assessment, diagnosis, and plan of care documented in the resident's note.  

## 2017-05-23 ENCOUNTER — Other Ambulatory Visit: Payer: Self-pay | Admitting: Gastroenterology

## 2017-05-23 ENCOUNTER — Encounter: Payer: Self-pay | Admitting: Internal Medicine

## 2017-05-23 DIAGNOSIS — I10 Essential (primary) hypertension: Secondary | ICD-10-CM | POA: Diagnosis not present

## 2017-05-23 DIAGNOSIS — K627 Radiation proctitis: Secondary | ICD-10-CM | POA: Diagnosis not present

## 2017-05-23 DIAGNOSIS — K625 Hemorrhage of anus and rectum: Secondary | ICD-10-CM | POA: Diagnosis not present

## 2017-06-03 ENCOUNTER — Encounter (HOSPITAL_COMMUNITY)
Admission: RE | Disposition: A | Discharge status: Home / Self Care | Payer: Self-pay | Source: Ambulatory Visit | Attending: Gastroenterology

## 2017-06-03 ENCOUNTER — Ambulatory Visit (HOSPITAL_COMMUNITY)
Admission: RE | Admit: 2017-06-03 | Discharge: 2017-06-03 | Disposition: A | Discharge status: Home / Self Care | Payer: Medicare Other | Source: Ambulatory Visit | Attending: Gastroenterology | Admitting: Gastroenterology

## 2017-06-03 ENCOUNTER — Encounter (HOSPITAL_COMMUNITY): Payer: Self-pay | Admitting: *Deleted

## 2017-06-03 DIAGNOSIS — G629 Polyneuropathy, unspecified: Secondary | ICD-10-CM | POA: Diagnosis not present

## 2017-06-03 DIAGNOSIS — Z923 Personal history of irradiation: Secondary | ICD-10-CM | POA: Diagnosis not present

## 2017-06-03 DIAGNOSIS — I10 Essential (primary) hypertension: Secondary | ICD-10-CM | POA: Diagnosis not present

## 2017-06-03 DIAGNOSIS — Z8546 Personal history of malignant neoplasm of prostate: Secondary | ICD-10-CM | POA: Insufficient documentation

## 2017-06-03 DIAGNOSIS — K627 Radiation proctitis: Secondary | ICD-10-CM | POA: Diagnosis not present

## 2017-06-03 DIAGNOSIS — K921 Melena: Secondary | ICD-10-CM | POA: Diagnosis not present

## 2017-06-03 DIAGNOSIS — Z87891 Personal history of nicotine dependence: Secondary | ICD-10-CM | POA: Diagnosis not present

## 2017-06-03 DIAGNOSIS — K5521 Angiodysplasia of colon with hemorrhage: Secondary | ICD-10-CM | POA: Insufficient documentation

## 2017-06-03 HISTORY — PX: FLEXIBLE SIGMOIDOSCOPY: SHX5431

## 2017-06-03 HISTORY — PX: HOT HEMOSTASIS: SHX5433

## 2017-06-03 SURGERY — SIGMOIDOSCOPY, FLEXIBLE
Anesthesia: Moderate Sedation

## 2017-06-03 MED ORDER — MIDAZOLAM HCL 5 MG/ML IJ SOLN
INTRAMUSCULAR | Status: AC
  Filled 2017-06-03: qty 2

## 2017-06-03 MED ORDER — MIDAZOLAM HCL 10 MG/2ML IJ SOLN
INTRAMUSCULAR | Status: DC | PRN
Start: 2017-06-03 — End: 2017-06-03
  Administered 2017-06-03: 2 mg via INTRAVENOUS
  Administered 2017-06-03: 1 mg via INTRAVENOUS
  Administered 2017-06-03: 2 mg via INTRAVENOUS

## 2017-06-03 MED ORDER — FENTANYL CITRATE (PF) 100 MCG/2ML IJ SOLN
INTRAMUSCULAR | Status: DC | PRN
  Administered 2017-06-03 (×2): 25 ug via INTRAVENOUS

## 2017-06-03 MED ORDER — FENTANYL CITRATE (PF) 100 MCG/2ML IJ SOLN
INTRAMUSCULAR | Status: AC
  Filled 2017-06-03: qty 2

## 2017-06-03 MED ORDER — SODIUM CHLORIDE 0.9 % IV SOLN: INTRAVENOUS | Status: DC

## 2017-06-03 NOTE — Discharge Instructions (Signed)

## 2017-06-03 NOTE — Op Note (Signed)
[facility] Patient Name: William Jones Procedure Date: 06/03/2017 MRN: [id number] Attending MD: Carol Ada , MD Date of Birth: [birth year] CSN: [id number] Age: 67 Admit Type: Outpatient Procedure:                Flexible Sigmoidoscopy Indications:              Hematochezia Providers:                Carol Ada, MD, Laverta Baltimore RN, RN, Tinnie Gens, Technician Referring MD:              Medicines:                Fentanyl 50 micrograms IV, Midazolam 5 mg IV Complications:            No immediate complications. Estimated Blood Loss:     Estimated blood loss was minimal. Procedure:                Pre-Anesthesia Assessment:                           - Prior to the procedure, a History and Physical                            was performed, and patient medications and                            allergies were reviewed. The patient's tolerance of                            previous anesthesia was also reviewed. The risks                            and benefits of the procedure and the sedation                            options and risks were discussed with the patient.                            All questions were answered, and informed consent                            was obtained. Prior Anticoagulants: The patient has                            taken no previous anticoagulant or antiplatelet                            agents. ASA Grade Assessment: II - A patient with                            mild systemic disease. After reviewing the risks  and benefits, the patient was deemed in                            satisfactory condition to undergo the procedure.                           - Sedation was administered by an endoscopy nurse.                            The sedation level attained was moderate.                           After obtaining informed consent, the scope was                            passed  under direct vision. The [id number])                            scope was introduced through the anus and advanced                            to the the sigmoid colon. The flexible                            sigmoidoscopy was accomplished without difficulty.                            The patient tolerated the procedure well. The                            quality of the bowel preparation was excellent. Scope In: Scope Out: Findings:      Multiple small patchy angioectasias with bleeding were found in the       rectum. Coagulation for hemostasis using monopolar probe was successful.       Estimated blood loss was minimal. Impression:               - Multiple bleeding colonic angioectasias. Treated                            with a monopolar probe.                           - No specimens collected. Moderate Sedation:      Moderate (conscious) sedation was administered by the endoscopy nurse       and supervised by the endoscopist. The following parameters were       monitored: oxygen saturation, heart rate, blood pressure, and response       to care. Recommendation:           - Patient has a contact number available for                            emergencies. The signs and symptoms of potential  delayed complications were discussed with the                            patient. Return to normal activities tomorrow.                            Written discharge instructions were provided to the                            patient.                           - Resume previous diet.                           - Return to GI clinic in 4 weeks. Procedure Code(s):        --- Professional ---                           [phone], Sigmoidoscopy, flexible; with control of                            bleeding, any method Diagnosis Code(s):        --- Professional ---                           K55.21, Angiodysplasia of colon with hemorrhage                           K92.1,  Melena (includes Hematochezia) CPT copyright 2016 American Medical Association. All rights reserved. The codes documented in this report are preliminary and upon coder review may  be revised to meet current compliance requirements. Carol Ada, MD Carol Ada, MD 06/03/2017 2:19:24 PM This report has been signed electronically. Number of Addenda: 0

## 2017-06-03 NOTE — H&P (Signed)
William Jones [place] HPI: This is a 66 year old male with a PMH of radiation proctitis with complaints of hematochezia.    Past Medical History:  Diagnosis Date  . Duodenitis   . Essential hypertension 07/16/2015  . H/O: substance abuse   . History of frostbite   . History of radiation therapy nov 2015 to feb 2016  . Iron deficiency anemia   . Peripheral neuropathy   . Prostate cancer ([place]) 03/05/14   Gleason 7, volume 45 gm  . S/P radiation therapy 09/18/14-11/15/14   prostate/ 7800Gy/40sessions  . Thrombocytopenia ([place])     Past Surgical History:  Procedure Laterality Date  . COLONOSCOPY N/A 01/10/2014   Procedure: COLONOSCOPY;  Surgeon: [name], MD;  Location: [place];  Service: Endoscopy;  Laterality: N/A;  . ESOPHAGOGASTRODUODENOSCOPY N/A 01/10/2014   Procedure: ESOPHAGOGASTRODUODENOSCOPY (EGD);  Surgeon: [name], MD;  Location[facility]COPY;  Service: Endoscopy;  Laterality: N/A;  . FLEXIBLE SIGMOIDOSCOPY N/A 09/19/2015   Procedure: FLEXIBLE SIGMOIDOSCOPY;  Surgeon: Carol Ada, MD;  Location: [facility] ENDOSCOPY;  Service: Endoscopy;  Laterality: N/A;  . GIVENS CAPSULE STUDY N/A 01/10/2014   Procedure: GIVENS CAPSULE STUDY;  Surgeon: [name], MD;  Location: [place];  Service: Endoscopy;  Laterality: N/A;  . HOT HEMOSTASIS N/A 09/19/2015   Procedure: HOT HEMOSTASIS (ARGON PLASMA COAGULATION/BICAP);  Surgeon: Carol Ada, MD;  Location: [facility];  Service: Endoscopy;  Laterality: N/A;  . PROSTATE BIOPSY  03/05/14   Gleason 7, vol 45 gm    Family History  Problem Relation Age of Onset  . Kidney failure Brother 11       has been on HD since age 75   . Stroke Maternal Uncle        16s-80s    Social History:  reports that he quit smoking about 9 years ago. His smoking use included Cigarettes. He quit after 30.00 years of use. He has never used smokeless tobacco. He reports that he drinks alcohol. He reports that he does not use drugs.  Allergies: No  Known Allergies  Medications: Scheduled: Continuous:  No results found for this or any previous visit (from the past 24 hour(s)).   No results found.  ROS:  As stated above in the HPI otherwise negative.  Blood pressure (!) 150/97, temperature 98.8 F (37.1 C), temperature source Oral, resp. rate 20, height 5\' 11"  (1.803 m), weight 76.2 kg (168 lb), SpO2 98 %.    PE: Gen: NAD, Alert and Oriented HEENT:  [place]/AT, EOMI Neck: Supple, no LAD Lungs: CTA Bilaterally CV: RRR without M/G/R ABM: Soft, NTND, +BS Ext: No C/C/E  Assessment/Plan: 1) Hematochezia. 2) Radiation proctitis - FFS with APC.  William Jones D 06/03/2017, 1:27 PM

## 2017-06-07 ENCOUNTER — Encounter (HOSPITAL_COMMUNITY): Payer: Self-pay | Admitting: Gastroenterology

## 2017-06-29 DIAGNOSIS — K627 Radiation proctitis: Secondary | ICD-10-CM | POA: Diagnosis not present

## 2017-06-29 DIAGNOSIS — K5521 Angiodysplasia of colon with hemorrhage: Secondary | ICD-10-CM | POA: Diagnosis not present

## 2017-07-20 ENCOUNTER — Ambulatory Visit (INDEPENDENT_AMBULATORY_CARE_PROVIDER_SITE_OTHER): Payer: Medicare Other | Admitting: Podiatry

## 2017-07-20 DIAGNOSIS — E0842 Diabetes mellitus due to underlying condition with diabetic polyneuropathy: Secondary | ICD-10-CM | POA: Diagnosis not present

## 2017-07-20 DIAGNOSIS — B351 Tinea unguium: Secondary | ICD-10-CM

## 2017-07-20 DIAGNOSIS — M79676 Pain in unspecified toe(s): Secondary | ICD-10-CM

## 2017-07-20 DIAGNOSIS — L84 Corns and callosities: Secondary | ICD-10-CM | POA: Diagnosis not present

## 2017-07-24 NOTE — Progress Notes (Signed)
    Subjective: Patient is a 66 y.o. male presenting to the office today with a chief complaint of a painful callus lesion to the second digit of the left foot that has been present for several months.  Patient also complains of elongated, thickened nails that cause pain while ambulating in shoes. Patient is unable to trim their own nails. Patient presents today for further treatment and evaluation.  Past Medical History:  Diagnosis Date  . Duodenitis   . Essential hypertension 07/16/2015  . H/O: substance abuse   . History of frostbite   . History of radiation therapy nov 2015 to feb 2016  . Iron deficiency anemia   . Peripheral neuropathy   . Prostate cancer ([place]) 03/05/14   Gleason 7, volume 45 gm  . S/P radiation therapy 09/18/14-11/15/14   prostate/ 7800Gy/40sessions  . Thrombocytopenia ([place])     Objective:  Physical Exam General: Alert and oriented x3 in no acute distress  Dermatology: Hyperkeratotic lesion present on the left second digit. Pain on palpation with a central nucleated core noted. Skin is warm, dry and supple bilateral lower extremities. Negative for open lesions or macerations. Nails are tender, long, thickened and dystrophic with subungual debris, consistent with onychomycosis, 1-5 bilateral. No signs of infection noted.  Vascular: Palpable pedal pulses bilaterally. No edema or erythema noted. Capillary refill within normal limits.  Neurological: Epicritic and protective threshold grossly intact bilaterally.   Musculoskeletal Exam: Pain on palpation at the keratotic lesion noted. Range of motion within normal limits bilateral. Muscle strength 5/5 in all groups bilateral.  Assessment: 1. Onychodystrophic nails 1-5 bilateral with hyperkeratosis of nails.  2. Onychomycosis of nail due to dermatophyte bilateral 3. Pre-ulcerative callous to the left second digit   Plan of Care:  #1 Patient evaluated. #2 Excisional debridement of keratoic lesion using a chisel  blade was performed without incident.  #3 Dressed with light dressing. #4 Mechanical debridement of nails 1-5 bilaterally performed using a nail nipper. Filed with dremel without incident.  #5 Patient is to return to the clinic in 3 months.   Edrick Kins, DPM [facility]  Dr. Edrick Kins, [address]                                        [place], [place] [address]                Office [phone]  Fax [phone]

## 2017-08-04 ENCOUNTER — Ambulatory Visit (INDEPENDENT_AMBULATORY_CARE_PROVIDER_SITE_OTHER): Payer: Medicare Other | Admitting: Internal Medicine

## 2017-08-04 DIAGNOSIS — Z8546 Personal history of malignant neoplasm of prostate: Secondary | ICD-10-CM | POA: Diagnosis not present

## 2017-08-04 DIAGNOSIS — R21 Rash and other nonspecific skin eruption: Secondary | ICD-10-CM

## 2017-08-04 DIAGNOSIS — L300 Nummular dermatitis: Secondary | ICD-10-CM | POA: Insufficient documentation

## 2017-08-04 DIAGNOSIS — L309 Dermatitis, unspecified: Secondary | ICD-10-CM | POA: Diagnosis not present

## 2017-08-04 DIAGNOSIS — Z923 Personal history of irradiation: Secondary | ICD-10-CM

## 2017-08-04 DIAGNOSIS — Z87891 Personal history of nicotine dependence: Secondary | ICD-10-CM | POA: Diagnosis not present

## 2017-08-04 HISTORY — DX: Rash and other nonspecific skin eruption: R21

## 2017-08-04 NOTE — Progress Notes (Signed)
Medicine attending: I personally interviewed and briefly examined this patient on the day of the patient visit and reviewed pertinent clinical  with resident physician Dr. Maryellen Pile and we discussed a management plan. Unusual rash on trunk and extremities. Hyperpigmented plaques, some w scaly margins, non erythematous base, mildly pruritic. Just appeared 2 wks ago. No clear preciptating factors. No adenopathy on exam. Denies penile discharge. Imp: non specific dermatitis.  Rec Rx w emollient lotion - if no improvement - refer to Derm for bx.

## 2017-08-04 NOTE — Assessment & Plan Note (Signed)
Unclear etiology. No obvious environmental factors. No systemic signs. Will do trial with conservative therapies. Advised to discontinue alcohol rub. Trial with moisturizing cream. Too diffuse to use steroid cream.  If no improvement in the next 2-3 weeks, return to clinic for biopsy.

## 2017-08-04 NOTE — Progress Notes (Signed)
   CC: Rash  HPI:  [name] is a 66 y.o. male with a past medical history listed below here today with complaints of rash.   Reports rash that began on his bilateral lower extremities 2-3 weeks ago. He then noticed the rash on his arms. Initially it was not pruritic, however, as it has spread he reports increasing pruritis. Denies any new soaps, detergents etc. Reports using alcohol rub after showering. No new medications. Currently only taking amlodipine and iron supplement. History of prostate cancer with radiation therapy 3 years ago. No new sexual contacts, reports being with his current partner for 14 years. Denies any fevers or chills. No penile discharge. Has not been spending time outdoors.  Past Medical History:  Diagnosis Date  . Duodenitis   . Essential hypertension 07/16/2015  . H/O: substance abuse   . History of frostbite   . History of radiation therapy nov 2015 to feb 2016  . Iron deficiency anemia   . Peripheral neuropathy   . Prostate cancer ([place]) 03/05/14   Gleason 7, volume 45 gm  . S/P radiation therapy 09/18/14-11/15/14   prostate/ 7800Gy/40sessions  . Thrombocytopenia ([place])    Review of Systems:   No chest pain or shortness of breath  Physical Exam:  Vitals:   08/04/17 1341  BP: 137/90  Pulse: 73  Temp: 97.8 F (36.6 C)  TempSrc: Oral  SpO2: 100%  Weight: 171 lb (77.6 kg)  Height: 5\' 7"  (1.702 m)   GENERAL- alert, co-operative, appears as stated age, not in any distress. CARDIAC- RRR, no murmurs, rubs or gallops. RESP- Moving equal volumes of air, and clear to auscultation bilaterally. ABDOMEN- Soft, nontender, bowel sounds present. LYMPH - No adenopathy EXTREMITIES- pulse 2+, symmetric, no pedal edema. Skin - diffuse hyperpigmented, rough lesions across bilateral arms and legs. No lesions across trunk or face.   Assessment & Plan:   See Encounters Tab for problem based charting.  Patient seen with Dr. Beryle Jones

## 2017-08-04 NOTE — Patient Instructions (Signed)
Mr. Zimmerle,  For your rash, I would like for you to stop using the alcohol as this can dry out your skin. You can get some Eczema cream to use and see if that helps. If the itching becomes unbearable let us know. If the rash isn't improving in the next 2-3 weeks, let us know and we may need to do a biopsy.

## 2017-08-16 ENCOUNTER — Encounter: Payer: Self-pay | Admitting: Internal Medicine

## 2017-08-16 ENCOUNTER — Ambulatory Visit (INDEPENDENT_AMBULATORY_CARE_PROVIDER_SITE_OTHER): Payer: Medicare Other | Admitting: Internal Medicine

## 2017-08-16 ENCOUNTER — Other Ambulatory Visit: Payer: Self-pay

## 2017-08-16 VITALS — BP 143/94 | HR 66 | Temp 97.8°F | Ht 67.0 in | Wt 175.2 lb

## 2017-08-16 DIAGNOSIS — R21 Rash and other nonspecific skin eruption: Secondary | ICD-10-CM

## 2017-08-16 DIAGNOSIS — L814 Other melanin hyperpigmentation: Secondary | ICD-10-CM

## 2017-08-16 DIAGNOSIS — Z87891 Personal history of nicotine dependence: Secondary | ICD-10-CM | POA: Diagnosis not present

## 2017-08-16 DIAGNOSIS — I10 Essential (primary) hypertension: Secondary | ICD-10-CM

## 2017-08-16 DIAGNOSIS — Z79899 Other long term (current) drug therapy: Secondary | ICD-10-CM

## 2017-08-16 NOTE — Patient Instructions (Signed)
Mr. Ivey it was nice seeing you today.  -Clean your skin with a gentle soap such as Dove and apply Vaseline instead of baby oil.  -Return to the clinic this week either Wednesday afternoon or Friday afternoon for a skin biopsy.  -Please take your blood pressure medication amlodipine regularly.

## 2017-08-17 ENCOUNTER — Encounter: Payer: Self-pay | Admitting: Internal Medicine

## 2017-08-17 ENCOUNTER — Other Ambulatory Visit (HOSPITAL_COMMUNITY)
Admission: RE | Admit: 2017-08-17 | Discharge: 2017-08-17 | Disposition: A | Discharge status: Home / Self Care | Payer: Medicare Other | Source: Ambulatory Visit | Attending: Internal Medicine | Admitting: Internal Medicine

## 2017-08-17 ENCOUNTER — Ambulatory Visit (INDEPENDENT_AMBULATORY_CARE_PROVIDER_SITE_OTHER): Payer: Medicare Other | Admitting: Internal Medicine

## 2017-08-17 VITALS — BP 134/88 | HR 67 | Temp 98.1°F | Ht 67.0 in | Wt 175.0 lb

## 2017-08-17 DIAGNOSIS — R21 Rash and other nonspecific skin eruption: Secondary | ICD-10-CM | POA: Diagnosis not present

## 2017-08-17 DIAGNOSIS — L308 Other specified dermatitis: Secondary | ICD-10-CM | POA: Diagnosis not present

## 2017-08-17 NOTE — Patient Instructions (Signed)
Thank you for your visit to the [facility].  We have collected the skin sample to be sent to the lab for diagnostic evaluation. This will greatly assist with determining the cause of your rash.  Please continue all previous care guidelines given at your visit the prior day. In addition, please keep the wound clean and dry until it has healed over. You should apply a new bandage daily and monitor the area for bleeding, swelling, redness or pain.  Feel free to call the clinic at any time for questions.

## 2017-08-17 NOTE — Progress Notes (Signed)
Internal Medicine Clinic Attending  Case discussed with Dr. Rathoreat the time of the visit. We reviewed the resident's history and exam and pertinent patient test results. I agree with the assessment, diagnosis, and plan of care documented in the resident's note.  

## 2017-08-17 NOTE — Assessment & Plan Note (Addendum)
BP Readings from Last 3 Encounters:  08/16/17 (!) 143/94  08/04/17 137/90  06/03/17 (!) 145/91    Lab Results  Component Value Date   NA 144 12/06/2014   K 3.6 12/06/2014   CREATININE 1.10 12/06/2014    Assessment: Blood pressure control:  Above goal Comments: Current medication regimen includes amlodipine 10 mg daily.  Patient states he ran out of his medication a few days ago and did not pick it up until yesterday.  He has not started taking it yet.  Plan: Medications:  continue current medications.  Emphasized the importance of medication compliance. Educational resources provided: brochure(denies need )

## 2017-08-17 NOTE — Progress Notes (Signed)
   CC: rash  HPI:  Mr.Aengus F Mccauslin is a 66 y.o. male who presents today for evaluation of a rash that is present on all four distal extremities. The rash is pruritic in nature, non-erythematous, has been present for three weeks, has never had this in the past, does not weep, has not formed vesicles, has not discharged, has not improved with EtOH application. Has somewhat improved in appearance with the application of Vaseline and baby oil.  Denied fever, chills, nausea, vomiting, diarrhea, contipation, chest pain, muscle aches, abdominal pain, visual changes, headache, weakness or fatigue.  Using sensitive skin detergents always. Denied knew pets or seasonal/known allergies. Denied history of asthma or family history of asthma.   Past Medical History:  Diagnosis Date  . Duodenitis   . Essential hypertension 07/16/2015  . H/O: substance abuse   . History of frostbite   . History of radiation therapy nov 2015 to feb 2016  . Iron deficiency anemia   . Peripheral neuropathy   . Prostate cancer ([place]) 03/05/14   Gleason 7, volume 45 gm  . S/P radiation therapy 09/18/14-11/15/14   prostate/ 7800Gy/40sessions  . Thrombocytopenia ([place])    Review of Systems:  ROS negative except as per HPI.  Physical Exam:  Vitals:   08/17/17 1447  BP: 134/88  Pulse: 67  Temp: 98.1 F (36.7 C)  TempSrc: Oral  SpO2: 100%  Weight: 175 lb (79.4 kg)  Height: 5\' 7"  (1.702 m)   Physical Exam  Constitutional: He appears well-developed and well-nourished. No distress.  Cardiovascular: Normal rate and regular rhythm.  Pulmonary/Chest: Effort normal and breath sounds normal. No respiratory distress.  Abdominal: Soft. Bowel sounds are normal. He exhibits no distension.  Musculoskeletal: He exhibits no edema.  Neurological: He is alert.  Skin: Rash (Diffuse hyperpigmented scaling rash present intermittently on all four extremities ) noted.  Psychiatric: He has a normal mood and affect.  Vitals  reviewed.   Assessment & Plan:   See Encounters Tab for problem based charting.  Patient seen with Dr. Angelia Mould

## 2017-08-17 NOTE — Progress Notes (Deleted)
   CC: ***  HPI:  [name] is a 66 y.o.   Past Medical History:  Diagnosis Date  . Duodenitis   . Essential hypertension 07/16/2015  . H/O: substance abuse   . History of frostbite   . History of radiation therapy nov 2015 to feb 2016  . Iron deficiency anemia   . Peripheral neuropathy   . Prostate cancer ([place]) 03/05/14   Gleason 7, volume 45 gm  . S/P radiation therapy 09/18/14-11/15/14   prostate/ 7800Gy/40sessions  . Thrombocytopenia ([place])    Review of Systems:  ***  Physical Exam:  Vitals:   08/17/17 1447  BP: 134/88  Pulse: 67  Temp: 98.1 F (36.7 C)  TempSrc: Oral  SpO2: 100%  Weight: 175 lb (79.4 kg)  Height: 5\' 7"  (1.702 m)   ***  Assessment & Plan:   See Encounters Tab for problem based charting.  Patient {GC/GE:[id number]::"discussed with","seen with"} Dr. {NAMES:[id number]::"William Jones","William Jones","William Jones","William Jones","William Jones","William Jones","William Jones","William Jones"}

## 2017-08-17 NOTE — Assessment & Plan Note (Signed)
Patient is presenting with a one-month history of a rash on his arms and legs.  He remembers receiving his influenza vaccine about a month ago but is not sure if the rash started prior to him receiving the vaccine.  States he was previously using Dove/ Black soap and rubbing alcohol but has now stopped using these products.  He is currently only using water to clean the skin and is applying baby oil.  States the areas of his skin affected by the rash have recently started being pruritic.  He denies engaging in any outdoor activities.  Denies having any food/ environmental allergies. On exam, noted to have hyperpigmented macules with scaliness in photo distributed areas of the arms and legs.  Explained to the patient that the etiology of his rash is unclear and that he will need a skin biopsy for further assessment.  Patient agreed with the plan.  Plan -Advised him to clean his skin with a gentle soap such as Dove and apply an emollient (Vaseline) instead of baby oil -He will be returning to the clinic on November 14 for a skin biopsy

## 2017-08-17 NOTE — Progress Notes (Signed)
   CC: Skin rash  HPI:  William Jones is a 66 y.o. male with past medical history of conditions listed below presenting to the clinic for a follow-up of a skin rash.  Hypertension was also discussed during this visit. Please see problem based charting for the status of the patient's current and chronic medical conditions.   Past Medical History:  Diagnosis Date  . Duodenitis   . Essential hypertension 07/16/2015  . H/O: substance abuse   . History of frostbite   . History of radiation therapy nov 2015 to feb 2016  . Iron deficiency anemia   . Peripheral neuropathy   . Prostate cancer ([place]) 03/05/14   Gleason 7, volume 45 gm  . S/P radiation therapy 09/18/14-11/15/14   prostate/ 7800Gy/40sessions  . Thrombocytopenia ([place])    Review of Systems: Pertinent positives mentioned in HPI. Remainder of all ROS negative.   Physical Exam:  Vitals:   08/16/17 1458  BP: (!) 143/94  Pulse: 66  Temp: 97.8 F (36.6 C)  TempSrc: Oral  SpO2: 100%  Weight: 175 lb 3.2 oz (79.5 kg)  Height: 5\' 7"  (1.702 m)   Physical Exam  Constitutional: He is oriented to person, place, and time. He appears well-developed and well-nourished. No distress.  HENT:  Head: Normocephalic and atraumatic.  Mouth/Throat: Oropharynx is clear and moist.  Eyes: Right eye exhibits no discharge. Left eye exhibits no discharge.  Cardiovascular: Normal rate and intact distal pulses. Exam reveals no gallop and no friction rub.  Pulmonary/Chest: Effort normal and breath sounds normal. No respiratory distress. He has no wheezes. He has no rales.  Abdominal: Soft. Bowel sounds are normal. He exhibits no distension. There is no tenderness.  Musculoskeletal: He exhibits no edema.  Neurological: He is alert and oriented to person, place, and time.  Skin: Skin is warm and dry. Rash noted.  Hyperpigmented macules with scaliness noted in photo distributed areas of the arms and legs.  Psychiatric: His behavior is normal.      Assessment & Plan:   See Encounters Tab for problem based charting.  Patient discussed with Dr. Daryll Drown

## 2017-08-17 NOTE — Assessment & Plan Note (Signed)
Assessment: See HPI  Plan: Skin biopsy taken for pathology report. Continue previous treatment.

## 2017-08-18 NOTE — Progress Notes (Addendum)
Procedure Note PRE-OP DIAGNOSIS: Rash POST-OP DIAGNOSIS: Same  PROCEDURE: skin lesion excision  Performing Physician: Dr. Berline Lopes Supervising Physician (if applicable): Dr. Heber Centerview PROCEDURE:  _ Shave Biopsy _ Scissors _ Cryotherapy _ Punch (Size 26mm)  The area surrounding of the skin lesion was prepared in the usual sterile manner. The lesion was removed in the usual manner by the biopsy method noted above. Hemostasis was assured.  Closure:  None needed, compression applied  Followup: The patient tolerated the procedure well without complications. Standard post-procedure care is explained and return precautions are given.  Kathi Ludwig, MD

## 2017-08-18 NOTE — Progress Notes (Signed)
Internal Medicine Clinic Attending  I saw and evaluated the patient.  I personally confirmed the key portions of the history and exam documented by Dr. Berline Lopes and I reviewed pertinent patient test results.  The assessment, diagnosis, and plan were formulated together and I agree with the documentation in the resident's note. I was present for the entire procedure.

## 2017-09-13 DIAGNOSIS — C61 Malignant neoplasm of prostate: Secondary | ICD-10-CM | POA: Diagnosis not present

## 2017-09-15 ENCOUNTER — Telehealth: Payer: Self-pay | Admitting: *Deleted

## 2017-09-15 DIAGNOSIS — R21 Rash and other nonspecific skin eruption: Secondary | ICD-10-CM

## 2017-09-15 MED ORDER — TRIAMCINOLONE ACETONIDE 0.1 % EX LOTN: 1.0000 "application " | TOPICAL_LOTION | Freq: Three times a day (TID) | CUTANEOUS | 1 refills | Status: DC

## 2017-09-15 MED ORDER — HYDROCORTISONE 2.5 % EX LOTN: TOPICAL_LOTION | CUTANEOUS | 1 refills | Status: DC

## 2017-09-15 MED ORDER — DESONIDE 0.05 % EX LOTN: TOPICAL_LOTION | Freq: Two times a day (BID) | CUTANEOUS | 1 refills | Status: DC

## 2017-09-15 NOTE — Addendum Note (Signed)
Addended by: Nicola Girt on: 09/15/2017 02:16 PM   Modules accepted: Orders

## 2017-09-15 NOTE — Addendum Note (Signed)
Addended by: Forde Dandy on: 09/15/2017 04:14 PM   Modules accepted: Orders

## 2017-09-15 NOTE — Telephone Encounter (Signed)
Pt presented to front desk, wants to know biopsy results, I have paged dr Berline Lopes.

## 2017-09-19 ENCOUNTER — Ambulatory Visit (INDEPENDENT_AMBULATORY_CARE_PROVIDER_SITE_OTHER): Payer: Medicare Other | Admitting: Podiatry

## 2017-09-19 ENCOUNTER — Encounter: Payer: Self-pay | Admitting: Podiatry

## 2017-09-19 DIAGNOSIS — B351 Tinea unguium: Secondary | ICD-10-CM

## 2017-09-19 DIAGNOSIS — E0842 Diabetes mellitus due to underlying condition with diabetic polyneuropathy: Secondary | ICD-10-CM

## 2017-09-19 DIAGNOSIS — M79676 Pain in unspecified toe(s): Secondary | ICD-10-CM

## 2017-09-19 DIAGNOSIS — L989 Disorder of the skin and subcutaneous tissue, unspecified: Secondary | ICD-10-CM | POA: Diagnosis not present

## 2017-09-21 NOTE — Progress Notes (Signed)
    Subjective: Patient is a 66 y.o. male presenting to the office today with a chief complaint of painful callus lesions to the left foot that have been present for several months.  Patient also complains of elongated, thickened nails that cause pain while ambulating in shoes. Patient is unable to trim their own nails. Patient presents today for further treatment and evaluation.  Past Medical History:  Diagnosis Date  . Duodenitis   . Essential hypertension 07/16/2015  . H/O: substance abuse   . History of frostbite   . History of radiation therapy nov 2015 to feb 2016  . Iron deficiency anemia   . Peripheral neuropathy   . Prostate cancer ([place]) 03/05/14   Gleason 7, volume 45 gm  . S/P radiation therapy 09/18/14-11/15/14   prostate/ 7800Gy/40sessions  . Thrombocytopenia ([place])     Objective:  Physical Exam General: Alert and oriented x3 in no acute distress  Dermatology: Hyperkeratotic lesions x 5 present on the left foot. Pain on palpation with a central nucleated core noted. Skin is warm, dry and supple bilateral lower extremities. Negative for open lesions or macerations. Nails are tender, long, thickened and dystrophic with subungual debris, consistent with onychomycosis, 1-5 bilateral. No signs of infection noted.  Vascular: Palpable pedal pulses bilaterally. No edema or erythema noted. Capillary refill within normal limits.  Neurological: Epicritic and protective threshold grossly intact bilaterally.   Musculoskeletal Exam: Pain on palpation at the keratotic lesion noted. Range of motion within normal limits bilateral. Muscle strength 5/5 in all groups bilateral.  Assessment: 1. Onychodystrophic nails 1-5 bilateral with hyperkeratosis of nails.  2. Onychomycosis of nail due to dermatophyte bilateral 3. Pre-ulcerative calluses x 5 to the left foot   Plan of Care:  #1 Patient evaluated. #2 Excisional debridement of keratoic lesion using a chisel blade was performed without  incident.  #3 Dressed with light dressing. #4 Mechanical debridement of nails 1-5 bilaterally performed using a nail nipper. Filed with dremel without incident.  #5 Patient is to return to the clinic in 3 months.   Edrick Kins, DPM [facility]  Dr. Edrick Kins, [address]                                        [place], [place] [address]                Office [phone]  Fax [phone]

## 2017-11-09 ENCOUNTER — Other Ambulatory Visit: Payer: Self-pay | Admitting: Internal Medicine

## 2017-11-10 MED ORDER — AMLODIPINE BESYLATE 10 MG PO TABS: 10.0000 mg | ORAL_TABLET | Freq: Every day | ORAL | 0 refills | Status: DC

## 2017-11-30 ENCOUNTER — Ambulatory Visit (INDEPENDENT_AMBULATORY_CARE_PROVIDER_SITE_OTHER): Payer: Medicare Other | Admitting: Podiatry

## 2017-11-30 DIAGNOSIS — B351 Tinea unguium: Secondary | ICD-10-CM

## 2017-11-30 DIAGNOSIS — M79676 Pain in unspecified toe(s): Secondary | ICD-10-CM | POA: Diagnosis not present

## 2017-11-30 DIAGNOSIS — L84 Corns and callosities: Secondary | ICD-10-CM

## 2017-11-30 DIAGNOSIS — E0842 Diabetes mellitus due to underlying condition with diabetic polyneuropathy: Secondary | ICD-10-CM | POA: Diagnosis not present

## 2017-12-04 NOTE — Progress Notes (Signed)
    Subjective: Patient is a 67 y.o. male presenting to the office today with a chief complaint of painful callus lesions to the left foot that have been present for several months. Patient also complains of elongated, thickened nails that cause pain while ambulating in shoes. He is unable to trim his own nails. Patient presents today for further treatment and evaluation.  Past Medical History:  Diagnosis Date  . Duodenitis   . Essential hypertension 07/16/2015  . H/O: substance abuse   . History of frostbite   . History of radiation therapy nov 2015 to feb 2016  . Iron deficiency anemia   . Peripheral neuropathy   . Prostate cancer ([place]) 03/05/14   Gleason 7, volume 45 gm  . S/P radiation therapy 09/18/14-11/15/14   prostate/ 7800Gy/40sessions  . Thrombocytopenia ([place])     Objective:  Physical Exam General: Alert and oriented x3 in no acute distress  Dermatology: Hyperkeratotic lesions x 5 present on the left foot. Pain on palpation with a central nucleated core noted. Skin is warm, dry and supple bilateral lower extremities. Negative for open lesions or macerations. Nails are tender, long, thickened and dystrophic with subungual debris, consistent with onychomycosis, 1-5 bilateral. No signs of infection noted.  Vascular: Palpable pedal pulses bilaterally. No edema or erythema noted. Capillary refill within normal limits.  Neurological: Epicritic and protective threshold grossly intact bilaterally.   Musculoskeletal Exam: Pain on palpation at the keratotic lesion noted. Range of motion within normal limits bilateral. Muscle strength 5/5 in all groups bilateral.  Assessment: 1. Onychodystrophic nails 1-5 bilateral with hyperkeratosis of nails.  2. Onychomycosis of nail due to dermatophyte bilateral 3. Pre-ulcerative calluses x 5 to the left foot   Plan of Care:  #1 Patient evaluated. #2 Excisional debridement of keratoic lesion using a chisel blade was performed without  incident.  #3 Dressed with light dressing. #4 Mechanical debridement of nails 1-5 bilaterally performed using a nail nipper. Filed with dremel without incident.  #5 Patient is to return to the clinic in 3 months.   Edrick Kins, DPM [facility]  Dr. Edrick Kins, [address]                                        [place], [place] [address]                Office [phone]  Fax [phone]

## 2017-12-28 ENCOUNTER — Other Ambulatory Visit: Payer: Self-pay | Admitting: Gastroenterology

## 2017-12-28 DIAGNOSIS — K625 Hemorrhage of anus and rectum: Secondary | ICD-10-CM | POA: Diagnosis not present

## 2017-12-28 DIAGNOSIS — K627 Radiation proctitis: Secondary | ICD-10-CM | POA: Diagnosis not present

## 2017-12-28 DIAGNOSIS — K640 First degree hemorrhoids: Secondary | ICD-10-CM | POA: Diagnosis not present

## 2018-01-06 ENCOUNTER — Other Ambulatory Visit: Payer: Self-pay

## 2018-01-06 ENCOUNTER — Encounter (HOSPITAL_COMMUNITY)
Admission: RE | Disposition: A | Discharge status: Home / Self Care | Payer: Self-pay | Source: Ambulatory Visit | Attending: Gastroenterology

## 2018-01-06 ENCOUNTER — Ambulatory Visit (HOSPITAL_COMMUNITY)
Admission: RE | Admit: 2018-01-06 | Discharge: 2018-01-06 | Disposition: A | Discharge status: Home / Self Care | Payer: Medicare Other | Source: Ambulatory Visit | Attending: Gastroenterology | Admitting: Gastroenterology

## 2018-01-06 ENCOUNTER — Encounter (HOSPITAL_COMMUNITY): Payer: Self-pay | Admitting: *Deleted

## 2018-01-06 DIAGNOSIS — G629 Polyneuropathy, unspecified: Secondary | ICD-10-CM | POA: Insufficient documentation

## 2018-01-06 DIAGNOSIS — I10 Essential (primary) hypertension: Secondary | ICD-10-CM | POA: Insufficient documentation

## 2018-01-06 DIAGNOSIS — Z8719 Personal history of other diseases of the digestive system: Secondary | ICD-10-CM | POA: Diagnosis not present

## 2018-01-06 DIAGNOSIS — K921 Melena: Secondary | ICD-10-CM | POA: Insufficient documentation

## 2018-01-06 DIAGNOSIS — Z8546 Personal history of malignant neoplasm of prostate: Secondary | ICD-10-CM | POA: Diagnosis not present

## 2018-01-06 DIAGNOSIS — Z923 Personal history of irradiation: Secondary | ICD-10-CM | POA: Diagnosis not present

## 2018-01-06 DIAGNOSIS — Z87891 Personal history of nicotine dependence: Secondary | ICD-10-CM | POA: Diagnosis not present

## 2018-01-06 DIAGNOSIS — K648 Other hemorrhoids: Secondary | ICD-10-CM | POA: Insufficient documentation

## 2018-01-06 DIAGNOSIS — K625 Hemorrhage of anus and rectum: Secondary | ICD-10-CM | POA: Diagnosis not present

## 2018-01-06 HISTORY — PX: FLEXIBLE SIGMOIDOSCOPY: SHX5431

## 2018-01-06 SURGERY — SIGMOIDOSCOPY, FLEXIBLE

## 2018-01-06 MED ORDER — SODIUM CHLORIDE 0.9 % IV SOLN: INTRAVENOUS | Status: DC

## 2018-01-06 NOTE — H&P (Signed)
William Jones [place] HPI: The patient complained about some minor hematochezia in the past and he has a history of radiation proctitis. He was asked to return in 6 months to reassess. Interestingly he had hematochezia this past week. It was rather significant in that there was a jet of blood when he was straining. Manual pressure resolved the bleeding. He states that he has been much better with his diet and he did not have any straining until recently, when he was not so diligent with his diet.   Past Medical History:  Diagnosis Date  . Duodenitis   . Essential hypertension 07/16/2015  . H/O: substance abuse   . History of frostbite   . History of radiation therapy nov 2015 to feb 2016  . Iron deficiency anemia   . Peripheral neuropathy   . Prostate cancer ([place]) 03/05/14   Gleason 7, volume 45 gm  . S/P radiation therapy 09/18/14-11/15/14   prostate/ 7800Gy/40sessions  . Thrombocytopenia ([place])     Past Surgical History:  Procedure Laterality Date  . COLONOSCOPY N/A 01/10/2014   Procedure: COLONOSCOPY;  Surgeon: [name], MD;  Location: [facility];  Service: Endoscopy;  Laterality: N/A;  . ESOPHAGOGASTRODUODENOSCOPY N/A 01/10/2014   Procedure: ESOPHAGOGASTRODUODENOSCOPY (EGD);  Surgeon: [name], MD;  Location: [facility] ENDOSCOPY;  Service: Endoscopy;  Laterality: N/A;  . FLEXIBLE SIGMOIDOSCOPY N/A 09/19/2015   Procedure: FLEXIBLE SIGMOIDOSCOPY;  Surgeon: Carol Ada, MD;  Location: [facility] ENDOSCOPY;  Service: Endoscopy;  Laterality: N/A;  . FLEXIBLE SIGMOIDOSCOPY N/A 06/03/2017   Procedure: FLEXIBLE SIGMOIDOSCOPY;  Surgeon: Carol Ada, MD;  Location: [facility] ENDOSCOPY;  Service: Endoscopy;  Laterality: N/A;  . GIVENS CAPSULE STUDY N/A 01/10/2014   Procedure: GIVENS CAPSULE STUDY;  Surgeon: [name], MD;  Location: [facility];  Service: Endoscopy;  Laterality: N/A;  . HOT HEMOSTASIS N/A 09/19/2015   Procedure: HOT HEMOSTASIS (ARGON PLASMA COAGULATION/BICAP);  Surgeon: Carol Ada, MD;  Location: [facility];  Service: Endoscopy;  Laterality: N/A;  . HOT HEMOSTASIS N/A 06/03/2017   Procedure: HOT HEMOSTASIS (ARGON PLASMA COAGULATION/BICAP);  Surgeon: Carol Ada, MD;  Location: [facility] ENDOSCOPY;  Service: Endoscopy;  Laterality: N/A;  . PROSTATE BIOPSY  03/05/14   Gleason 7, vol 45 gm    Family History  Problem Relation Age of Onset  . Kidney failure Brother 23       has been on HD since age 44   . Stroke Maternal Uncle        28s-80s    Social History:  reports that he quit smoking about 10 years ago. His smoking use included cigarettes. He quit after 30.00 years of use. He has never used smokeless tobacco. He reports that he drinks alcohol. He reports that he does not use drugs.  Allergies: No Known Allergies  Medications:  Scheduled:  Continuous: . sodium chloride      No results found for this or any previous visit (from the past 24 hour(s)).   No results found.  ROS:  As stated above in the HPI otherwise negative.  Blood pressure 119/80, pulse 80, temperature 97.6 F (36.4 C), temperature source Oral, resp. rate 20, SpO2 100 %.    PE: Gen: NAD, Alert and Oriented HEENT:  [place]/AT, EOMI Neck: Supple, no LAD Lungs: CTA Bilaterally CV: RRR without M/G/R ABM: Soft, NTND, +BS Ext: No C/C/E  Assessment/Plan: 1) Hematochezia - His clinical description is consistent with a hemorrhoidal source and he can benefit with banding, however, with his prior history of radiation  proctitis, a repeat FFS will be performed. This is to evaluate the internal hemorrhoids to ensure that there is enough pliability for banding and to treat any residual radiation proctitis.   Undrea Shipes D 01/06/2018, 7:32 AM

## 2018-01-06 NOTE — Discharge Instructions (Signed)

## 2018-01-06 NOTE — Op Note (Signed)
[facility] Patient Name: William Jones Procedure Date: 01/06/2018 MRN: [id number] Attending MD: Carol Ada , MD Date of Birth: 1950-10-19 CSN: [id number] Age: 67 Admit Type: Outpatient Procedure:                Flexible Sigmoidoscopy Indications:              Hematochezia Providers:                Carol Ada, MD, Cleda Daub, RN, Charolette Child,                            Technician Referring MD:              Medicines:                None Complications:            No immediate complications. Estimated Blood Loss:     Estimated blood loss: none. Procedure:                Pre-Anesthesia Assessment:                           - Prior to the procedure, a History and Physical                            was performed, and patient medications and                            allergies were reviewed. The patient's tolerance of                            previous anesthesia was also reviewed. The risks                            and benefits of the procedure and the sedation                            options and risks were discussed with the patient.                            All questions were answered, and informed consent                            was obtained. Prior Anticoagulants: The patient has                            taken no previous anticoagulant or antiplatelet                            agents. ASA Grade Assessment: II - A patient with                            mild systemic disease. After reviewing the risks                            and benefits, the patient was  deemed in                            satisfactory condition to undergo the procedure.                           After obtaining informed consent, the scope was                            passed under direct vision. The EG-[id number] ([id number])                            scope was introduced through the anus and advanced                            to the the sigmoid colon. The flexible                    sigmoidoscopy was accomplished without difficulty.                            The patient tolerated the procedure well. The                            quality of the bowel preparation was excellent. Scope In: Scope Out: Findings:      The entire examined colon appeared normal. Retroflexion did reveal large       internal hemorrhoids. There was almost no evidence of a radiation       proctitis. Impression:               - The entire examined colon is normal.                           - No specimens collected. Moderate Sedation:      None Recommendation:           - Patient has a contact number available for                            emergencies. The signs and symptoms of potential                            delayed complications were discussed with the                            patient. Return to normal activities tomorrow.                            Written discharge instructions were provided to the                            patient.                           - Resume regular diet.                           -  Refer to Surgery for treatment of hemorrhoids. Procedure Code(s):        --- Professional ---                           [phone], Sigmoidoscopy, flexible; diagnostic,                            including collection of specimen(s) by brushing or                            washing, when performed (separate procedure) Diagnosis Code(s):        --- Professional ---                           K92.1, Melena (includes Hematochezia) CPT copyright 2017 American Medical Association. All rights reserved. The codes documented in this report are preliminary and upon coder review may  be revised to meet current compliance requirements. Carol Ada, MD Carol Ada, MD 01/06/2018 7:52:43 AM This report has been signed electronically. Number of Addenda: 0

## 2018-01-08 ENCOUNTER — Encounter (HOSPITAL_COMMUNITY): Payer: Self-pay | Admitting: Gastroenterology

## 2018-02-08 ENCOUNTER — Encounter: Payer: Medicare Other | Admitting: Podiatry

## 2018-02-11 ENCOUNTER — Other Ambulatory Visit: Payer: Self-pay

## 2018-02-11 ENCOUNTER — Encounter (HOSPITAL_COMMUNITY): Payer: Self-pay

## 2018-02-11 DIAGNOSIS — Z923 Personal history of irradiation: Secondary | ICD-10-CM | POA: Insufficient documentation

## 2018-02-11 DIAGNOSIS — H5702 Anisocoria: Secondary | ICD-10-CM | POA: Insufficient documentation

## 2018-02-11 DIAGNOSIS — R220 Localized swelling, mass and lump, head: Secondary | ICD-10-CM | POA: Diagnosis not present

## 2018-02-11 DIAGNOSIS — I10 Essential (primary) hypertension: Secondary | ICD-10-CM | POA: Insufficient documentation

## 2018-02-11 DIAGNOSIS — Z87891 Personal history of nicotine dependence: Secondary | ICD-10-CM | POA: Insufficient documentation

## 2018-02-11 DIAGNOSIS — Z8546 Personal history of malignant neoplasm of prostate: Secondary | ICD-10-CM | POA: Insufficient documentation

## 2018-02-11 DIAGNOSIS — H579 Unspecified disorder of eye and adnexa: Secondary | ICD-10-CM | POA: Diagnosis present

## 2018-02-11 DIAGNOSIS — R51 Headache: Secondary | ICD-10-CM | POA: Insufficient documentation

## 2018-02-11 DIAGNOSIS — H109 Unspecified conjunctivitis: Secondary | ICD-10-CM | POA: Insufficient documentation

## 2018-02-11 NOTE — ED Triage Notes (Addendum)
Pt states redness and itchiness in right eye for several days. No vision impairment

## 2018-02-12 ENCOUNTER — Emergency Department (HOSPITAL_COMMUNITY): Payer: Medicare Other

## 2018-02-12 ENCOUNTER — Other Ambulatory Visit: Payer: Self-pay

## 2018-02-12 ENCOUNTER — Emergency Department (HOSPITAL_COMMUNITY)
Admission: EM | Admit: 2018-02-12 | Discharge: 2018-02-12 | Disposition: A | Discharge status: Home / Self Care | Payer: Medicare Other | Attending: Emergency Medicine | Admitting: Emergency Medicine

## 2018-02-12 DIAGNOSIS — H109 Unspecified conjunctivitis: Secondary | ICD-10-CM

## 2018-02-12 DIAGNOSIS — R220 Localized swelling, mass and lump, head: Secondary | ICD-10-CM | POA: Diagnosis not present

## 2018-02-12 DIAGNOSIS — H5702 Anisocoria: Secondary | ICD-10-CM

## 2018-02-12 LAB — CBC WITH DIFFERENTIAL/PLATELET
BASOS PCT: 0 %
Basophils Absolute: 0 10*3/uL (ref 0.0–0.1)
EOS ABS: 0.1 10*3/uL (ref 0.0–0.7)
EOS PCT: 4 %
HCT: 42.2 % (ref 39.0–52.0)
HEMOGLOBIN: 13.5 g/dL (ref 13.0–17.0)
Lymphocytes Relative: 45 %
Lymphs Abs: 1.4 10*3/uL (ref 0.7–4.0)
MCH: 27.7 pg (ref 26.0–34.0)
MCHC: 32 g/dL (ref 30.0–36.0)
MCV: 86.7 fL (ref 78.0–100.0)
Monocytes Absolute: 0.4 10*3/uL (ref 0.1–1.0)
Monocytes Relative: 13 %
NEUTROS PCT: 38 %
Neutro Abs: 1.2 10*3/uL — ABNORMAL LOW (ref 1.7–7.7)
PLATELETS: 234 10*3/uL (ref 150–400)
RBC: 4.87 MIL/uL (ref 4.22–5.81)
RDW: 14.8 % (ref 11.5–15.5)
WBC: 3.1 10*3/uL — AB (ref 4.0–10.5)

## 2018-02-12 LAB — COMPREHENSIVE METABOLIC PANEL
ALBUMIN: 3.2 g/dL — AB (ref 3.5–5.0)
ALK PHOS: 44 U/L (ref 38–126)
ALT: 20 U/L (ref 17–63)
ANION GAP: 7 (ref 5–15)
AST: 20 U/L (ref 15–41)
BUN: 14 mg/dL (ref 6–20)
CALCIUM: 8.3 mg/dL — AB (ref 8.9–10.3)
CHLORIDE: 107 mmol/L (ref 101–111)
CO2: 26 mmol/L (ref 22–32)
CREATININE: 0.76 mg/dL (ref 0.61–1.24)
GFR calc non Af Amer: >60 mL/min (ref >60)
GLUCOSE: 113 mg/dL — AB (ref 65–99)
Potassium: 3.8 mmol/L (ref 3.5–5.1)
SODIUM: 140 mmol/L (ref 135–145)
Total Bilirubin: <0.1 mg/dL — ABNORMAL LOW (ref 0.3–1.2)
Total Protein: 6.2 g/dL — ABNORMAL LOW (ref 6.5–8.1)

## 2018-02-12 LAB — SEDIMENTATION RATE: Sed Rate: 3 mm/hr (ref 0–16)

## 2018-02-12 MED ORDER — ERYTHROMYCIN 5 MG/GM OP OINT: TOPICAL_OINTMENT | OPHTHALMIC | 0 refills | Status: DC

## 2018-02-12 NOTE — Discharge Instructions (Addendum)
Here red eye is from a viral infection.  However, your pupils (the black part in the center of your eye) are different sizes.  Please follow-up with the ophthalmologist for evaluation to see why this is the case.  Return if your symptoms are getting worse.

## 2018-02-12 NOTE — ED Provider Notes (Signed)
[facility] DEPT Provider Note   CSN: [id number] Arrival date & time: 02/11/18  1807     History   Chief Complaint Chief Complaint  Patient presents with  . Eye Problem    HPI William Jones is a 67 y.o. male.  The history is provided by the patient.  He has history of hypertension, prostate cancer, peripheral neuropathy and comes in because of redness and drainage from his right eye.  Symptoms started 4 days ago.  At that time, he had a bad headache and was unable to sleep.  He noticed that his right eye was red.  Headache and eye pain have resolved.  However, the eye continues to feel irritated and has been draining clear fluid.  He denies any ongoing headache.  He denies any blurring of vision.  Denies foreign body sensation.  He denies any trauma to the eye.  He did try using Visine, but it did not help, so he stopped using it.  He does not use any eyedrops and has not had any eye surgery.  Past Medical History:  Diagnosis Date  . Duodenitis   . Essential hypertension 07/16/2015  . H/O: substance abuse   . History of frostbite   . History of radiation therapy nov 2015 to feb 2016  . Iron deficiency anemia   . Peripheral neuropathy   . Prostate cancer ([place]) 03/05/14   Gleason 7, volume 45 gm  . S/P radiation therapy 09/18/14-11/15/14   prostate/ 7800Gy/40sessions  . Thrombocytopenia [facility])     Patient Active Problem List   Diagnosis Date Noted  . Rash and nonspecific skin eruption 08/04/2017  . Leukopenia 04/20/2017  . Rectal mass 09/15/2016  . Healthcare maintenance 06/23/2016  . Epidermoid cyst of skin 01/23/2016  . Radiation proctitis 09/24/2015  . Essential hypertension 07/16/2015  . History of prostate cancer 03/20/2014  . Duodenitis 01/23/2014  . Erectile dysfunction 01/23/2014  . Iron deficiency anemia 01/08/2014    Past Surgical History:  Procedure Laterality Date  . COLONOSCOPY N/A 01/10/2014   Procedure: COLONOSCOPY;   Surgeon: [name], MD;  Location: [facility];  Service: Endoscopy;  Laterality: N/A;  . ESOPHAGOGASTRODUODENOSCOPY N/A 01/10/2014   Procedure: ESOPHAGOGASTRODUODENOSCOPY (EGD);  Surgeon: [name], MD;  Location: [facility] ENDOSCOPY;  Service: Endoscopy;  Laterality: N/A;  . FLEXIBLE SIGMOIDOSCOPY N/A 09/19/2015   Procedure: FLEXIBLE SIGMOIDOSCOPY;  Surgeon: Carol Ada, MD;  [facility] ENDOSCOPY;  Service: Endoscopy;  Laterality: N/A;  . FLEXIBLE SIGMOIDOSCOPY N/A 06/03/2017   Procedure: FLEXIBLE SIGMOIDOSCOPY;  Surgeon: Carol Ada, MD;  Location: [facility] ENDOSCOPY;  Service: Endoscopy;  Laterality: N/A;  . FLEXIBLE SIGMOIDOSCOPY N/A 01/06/2018   Procedure: FLEXIBLE SIGMOIDOSCOPY;  Surgeon: Carol Ada, MD;  Location: [facility] ENDOSCOPY;  Service: Endoscopy;  Laterality: N/A;  . GIVENS CAPSULE STUDY N/A 01/10/2014   Procedure: GIVENS CAPSULE STUDY;  Surgeon: [name], MD;  Location: [facility];  Service: Endoscopy;  Laterality: N/A;  . HOT HEMOSTASIS N/A 09/19/2015   Procedure: HOT HEMOSTASIS (ARGON PLASMA COAGULATION/BICAP);  Surgeon: Carol Ada, MD;  Location: [facility];  Service: Endoscopy;  Laterality: N/A;  . HOT HEMOSTASIS N/A 06/03/2017   Procedure: HOT HEMOSTASIS (ARGON PLASMA COAGULATION/BICAP);  Surgeon: Carol Ada, MD;  Location: [facility];  Service: Endoscopy;  Laterality: N/A;  . PROSTATE BIOPSY  03/05/14   Gleason 7, vol 45 gm        Home Medications    Prior to Admission medications   Medication Sig Start Date End Date  Taking? Authorizing Provider  ferrous sulfate 325 (65 FE) MG tablet Take 1 tablet (325 mg total) by mouth daily with breakfast. 09/23/15   Shela Leff, MD    Family History Family History  Problem Relation Age of Onset  . Kidney failure Brother 7       has been on HD since age 82   . Stroke Maternal Uncle        74s-80s    Social History Social History   Tobacco Use  . Smoking status: Former Smoker    Years: 30.00     Types: Cigarettes    Last attempt to quit: 10/05/2007    Years since quitting: 10.3  . Smokeless tobacco: Never Used  Substance Use Topics  . Alcohol use: Yes    Alcohol/week: 0.0 oz    Comment: no alcohol for one year  . Drug use: No    Types: "Crack" cocaine, Marijuana    Comment: per Epic note 2009hx of crack cocaine use 12 years ago per pt, no current marijuana use per pt     Allergies   Patient has no known allergies.   Review of Systems Review of Systems  All other systems reviewed and are negative.    Physical Exam Updated Vital Signs BP 124/82 (BP Location: Left Arm)   Pulse 63   Temp 97.6 F (36.4 C) (Oral)   Resp 16   Ht 5\' 10"  (1.778 m)   Wt 73.5 kg (162 lb)   SpO2 100%   BMI 23.24 kg/m   Physical Exam  Nursing note and vitals reviewed.  67 year old male, resting comfortably and in no acute distress. Vital signs are normal. Oxygen saturation is 100%, which is normal. Head is normocephalic and atraumatic.  Right pupil is 5 mm and sluggishly reactive, left pupil is 2 mm and not reactive.  Extraocular movements are full.  Right fundus shows no hemorrhage, exudate, papilledema.  Unable to see left fundus because of pupillary constriction.  Mild injection of the right conjunctiva.  Oropharynx is clear. Neck is nontender and supple without adenopathy or JVD.  There are no carotid bruits. Back is nontender and there is no CVA tenderness. Lungs are clear without rales, wheezes, or rhonchi. Chest is nontender. Heart has regular rate and rhythm without murmur. Abdomen is soft, flat, nontender without masses or hepatosplenomegaly and peristalsis is normoactive. Extremities have no cyanosis or edema, full range of motion is present. Skin is warm and dry without rash. Neurologic: Mental status is normal, cranial nerves are intact, there are no motor or sensory deficits.  There is no pronator drift.  ED Treatments / Results  Labs (all labs ordered are listed, but only  abnormal results are displayed) Labs Reviewed  CBC WITH DIFFERENTIAL/PLATELET - Abnormal; Notable for the following components:      Result Value   WBC 3.1 (*)    Neutro Abs 1.2 (*)    All other components within normal limits  COMPREHENSIVE METABOLIC PANEL - Abnormal; Notable for the following components:   Glucose, Bld 113 (*)    Calcium 8.3 (*)    Total Protein 6.2 (*)    Albumin 3.2 (*)    Total Bilirubin <0.1 (*)    All other components within normal limits  SEDIMENTATION RATE   Radiology Ct Head Wo Contrast  Result Date: 02/12/2018 CLINICAL DATA:  Eyes are red, right greater than left. EXAM: CT HEAD WITHOUT CONTRAST TECHNIQUE: Contiguous axial images were obtained from the base of  the skull through the vertex without intravenous contrast. COMPARISON:  12/07/2014 FINDINGS: Brain: No evidence of acute infarction, hemorrhage, hydrocephalus, extra-axial collection or mass lesion/mass effect. Vascular: No hyperdense vessel or unexpected calcification. Skull: Normal. Negative for fracture or focal lesion. Sinuses/Orbits: Complete opacification of the frontal, near complete opacification of the anterior ethmoid and included bilateral maxillary sinuses with sparing of the sphenoid sinus. Wall thickening of the maxillary sinuses would be keeping with changes of chronic sinusitis. The orbits are symmetric and intact without retrobulbar are inflammation, mass or hemorrhage. No significant preseptal soft tissue swelling. Other: None IMPRESSION: 1. Chronic pansinusitis, advanced since prior comparison. 2. No acute intracranial abnormality. 3. Symmetric intact appearance of the orbits and globes. No acute abnormality is visualized. Electronically Signed   By: Ashley Royalty M.D.   On: 02/12/2018 03:44    Procedures Procedures   Medications Ordered in ED Medications - No data to display   Initial Impression / Assessment and Plan / ED Course  I have reviewed the triage vital signs and the nursing  notes.  Pertinent labs & imaging results that were available during my care of the patient were reviewed by me and considered in my medical decision making (see chart for details).  Conjunctival injection on the right of uncertain cause.  Anisocoria.  At this point, I do not see any indication of anything more serious than viral conjunctivitis for his injected conjunctiva on the right.  However, cause of anisocoria is unclear and I doubt the two problems are related.  No eye pain or photophobia to suggest iritis.  No vision change or eye pain to suggest acute glaucoma.  Will check screening labs and sent for CT scan to rule out tumor causing anisocoria.  Old records are reviewed, and I see no other mention of anisocoria.  CT of head in March 2016 was unremarkable.  CT of head is unremarkable except for findings of chronic sinusitis.  No evidence of tumor.  Laboratory work-up is unremarkable including normal sedimentation rate.  I suspect any sick coria is chronic, but will refer to ophthalmology for further evaluation.  He is discharged with prescription for erythromycin ophthalmic ointment.  Final Clinical Impressions(s) / ED Diagnoses   Final diagnoses:  Conjunctivitis of right eye, unspecified conjunctivitis type  Anisocoria    ED Discharge Orders        Ordered    erythromycin ophthalmic ointment     18/84/16 [id number]       Delora Fuel, MD 01/60/10 [phone]

## 2018-02-13 ENCOUNTER — Ambulatory Visit (INDEPENDENT_AMBULATORY_CARE_PROVIDER_SITE_OTHER): Payer: Medicare Other | Admitting: Podiatry

## 2018-02-13 DIAGNOSIS — E0842 Diabetes mellitus due to underlying condition with diabetic polyneuropathy: Secondary | ICD-10-CM

## 2018-02-13 DIAGNOSIS — L84 Corns and callosities: Secondary | ICD-10-CM

## 2018-02-13 DIAGNOSIS — B351 Tinea unguium: Secondary | ICD-10-CM | POA: Diagnosis not present

## 2018-02-13 DIAGNOSIS — M79676 Pain in unspecified toe(s): Secondary | ICD-10-CM

## 2018-02-15 ENCOUNTER — Other Ambulatory Visit: Payer: Self-pay

## 2018-02-15 NOTE — Progress Notes (Signed)
This encounter was created in error - please disregard.

## 2018-02-15 NOTE — Progress Notes (Signed)
    Subjective: Patient is a 67 y.o. male presenting to the office today with a chief complaint of painful callus lesions to the left foot that have been present for several months. Patient also complains of elongated, thickened nails that cause pain while ambulating in shoes. He is unable to trim his own nails. Patient presents today for further treatment and evaluation.  Past Medical History:  Diagnosis Date  . Duodenitis   . Essential hypertension 07/16/2015  . H/O: substance abuse   . History of frostbite   . History of radiation therapy nov 2015 to feb 2016  . Iron deficiency anemia   . Peripheral neuropathy   . Prostate cancer ([place]) 03/05/14   Gleason 7, volume 45 gm  . S/P radiation therapy 09/18/14-11/15/14   prostate/ 7800Gy/40sessions  . Thrombocytopenia ([place])     Objective:  Physical Exam General: Alert and oriented x3 in no acute distress  Dermatology: Hyperkeratotic lesions x 5 present on the left foot. Pain on palpation with a central nucleated core noted. Skin is warm, dry and supple bilateral lower extremities. Negative for open lesions or macerations. Nails are tender, long, thickened and dystrophic with subungual debris, consistent with onychomycosis, 1-5 bilateral. No signs of infection noted.  Vascular: Palpable pedal pulses bilaterally. No edema or erythema noted. Capillary refill within normal limits.  Neurological: Epicritic and protective threshold grossly intact bilaterally.   Musculoskeletal Exam: Pain on palpation at the keratotic lesion noted. Range of motion within normal limits bilateral. Muscle strength 5/5 in all groups bilateral.  Assessment: 1. Onychodystrophic nails 1-5 bilateral with hyperkeratosis of nails.  2. Onychomycosis of nail due to dermatophyte bilateral 3. Pre-ulcerative calluses x 5 to the left foot   Plan of Care:  #1 Patient evaluated. #2 Excisional debridement of keratoic lesion using a chisel blade was performed without  incident.  #3 Dressed with light dressing. #4 Mechanical debridement of nails 1-5 bilaterally performed using a nail nipper. Filed with dremel without incident.  #5 Patient is to return to the clinic in 3 months.   Edrick Kins, DPM [facility]  Dr. Edrick Kins, [place]                                        [place], [place] [address]                Office [phone]  Fax [phone]

## 2018-02-17 DIAGNOSIS — H209 Unspecified iridocyclitis: Secondary | ICD-10-CM | POA: Diagnosis not present

## 2018-02-21 DIAGNOSIS — H209 Unspecified iridocyclitis: Secondary | ICD-10-CM | POA: Diagnosis not present

## 2018-02-24 ENCOUNTER — Other Ambulatory Visit: Payer: Self-pay

## 2018-02-24 NOTE — Patient Outreach (Signed)
[facility]) Care Management  02/24/2018  [facility]am [facility] 22-Nov-1950 [id number]    TELEPHONE SCREENING Referral date 02/15/18 Referral source: ED census Insurance: [place] health care  Telephone call from patient. HIPAA verified. Explained reason for call. RNCM discussed and offered [facility] care management services. Patient states he does not need the services at this time.  States he has transportation to his appointments and sees his doctor regularly. Patient reports his next primary MD visit is in June 2019.  Patient states he gets his medications without any problems at this time.   Patient agreed to [facility] mailing [facility] care management brochure for future reference.   PLAN; RNCM will close patient due to refusal of services. D RNCM will send patient [facility] care management brochure as discussed.  RNCM will send close letter to patients primary MD   Quinn Plowman RN,BSN,CCM [facility] Telephonic  [phone]

## 2018-02-24 NOTE — Patient Outreach (Signed)
[facility] Care Management  02/24/2018  Dara Camargo [facility] [birth year] [id number]   TELEPHONE SCREENING Referral date5/15/19:  Referral source: ED census Insurance: [employer] care  Telephone call to patient regarding ED census referral. Unable to reach patient. HIPAA compliant voice message left with call back phone number.   PLAN: RNCM will attempt 2nd telephone call to patient within 4 business days.  RNCM will send patient outreach letter.   Quinn Plowman RN,BSN,CCM [facility] Telephonic  [phone]

## 2018-02-28 DIAGNOSIS — H209 Unspecified iridocyclitis: Secondary | ICD-10-CM | POA: Diagnosis not present

## 2018-03-06 DIAGNOSIS — K642 Third degree hemorrhoids: Secondary | ICD-10-CM | POA: Diagnosis not present

## 2018-03-15 DIAGNOSIS — H209 Unspecified iridocyclitis: Secondary | ICD-10-CM | POA: Diagnosis not present

## 2018-03-29 ENCOUNTER — Encounter: Payer: Self-pay | Admitting: *Deleted

## 2018-04-12 DIAGNOSIS — H40051 Ocular hypertension, right eye: Secondary | ICD-10-CM | POA: Diagnosis not present

## 2018-04-12 DIAGNOSIS — H209 Unspecified iridocyclitis: Secondary | ICD-10-CM | POA: Diagnosis not present

## 2018-04-17 ENCOUNTER — Encounter: Payer: Self-pay | Admitting: Podiatry

## 2018-04-17 ENCOUNTER — Ambulatory Visit (INDEPENDENT_AMBULATORY_CARE_PROVIDER_SITE_OTHER): Payer: Medicare Other | Admitting: Podiatry

## 2018-04-17 DIAGNOSIS — M79676 Pain in unspecified toe(s): Secondary | ICD-10-CM | POA: Diagnosis not present

## 2018-04-17 DIAGNOSIS — E0842 Diabetes mellitus due to underlying condition with diabetic polyneuropathy: Secondary | ICD-10-CM

## 2018-04-17 DIAGNOSIS — B351 Tinea unguium: Secondary | ICD-10-CM | POA: Diagnosis not present

## 2018-04-17 DIAGNOSIS — L989 Disorder of the skin and subcutaneous tissue, unspecified: Secondary | ICD-10-CM

## 2018-04-24 NOTE — Progress Notes (Signed)
    Subjective: Patient is a 67 y.o. male presenting to the office today with a chief complaint of painful callus lesions to the bilateral feet that have been present for several months. Walking and bearing weight for long periods of time increases the pain. He has not done anything at home for treatment.  Patient also complains of elongated, thickened nails that cause pain while ambulating in shoes. He is unable to trim his own nails. Patient presents today for further treatment and evaluation.   Past Medical History:  Diagnosis Date  . Duodenitis   . Essential hypertension 07/16/2015  . H/O: substance abuse   . History of frostbite   . History of radiation therapy nov 2015 to feb 2016  . Iron deficiency anemia   . Peripheral neuropathy   . Prostate cancer ([place]) 03/05/14   Gleason 7, volume 45 gm  . S/P radiation therapy 09/18/14-11/15/14   prostate/ 7800Gy/40sessions  . Thrombocytopenia ([place])     Objective:  Physical Exam General: Alert and oriented x3 in no acute distress  Dermatology: Hyperkeratotic lesions x 2 present on the bilateral feet. Pain on palpation with a central nucleated core noted. Skin is warm, dry and supple bilateral lower extremities. Negative for open lesions or macerations. Nails are tender, long, thickened and dystrophic with subungual debris, consistent with onychomycosis, 1-5 bilateral. No signs of infection noted.  Vascular: Palpable pedal pulses bilaterally. No edema or erythema noted. Capillary refill within normal limits.  Neurological: Epicritic and protective threshold grossly intact bilaterally.   Musculoskeletal Exam: Pain on palpation at the keratotic lesion noted. Range of motion within normal limits bilateral. Muscle strength 5/5 in all groups bilateral.  Assessment: 1. Onychodystrophic nails 1-5 bilateral with hyperkeratosis of nails.  2. Onychomycosis of nail due to dermatophyte bilateral 3. Pre-ulcerative calluses x 2 to the bilateral  feet   Plan of Care:  #1 Patient evaluated. #2 Excisional debridement of keratoic lesion using a chisel blade was performed without incident.  #3 Dressed with light dressing. #4 Mechanical debridement of nails 1-5 bilaterally performed using a nail nipper. Filed with dremel without incident.  #5 Patient is to return to the clinic in 3 months.   Edrick Kins, DPM [facility]  Dr. Edrick Kins, [place]                                        [place], [place] [address]                Office [phone]  Fax [phone]

## 2018-04-27 DIAGNOSIS — H40051 Ocular hypertension, right eye: Secondary | ICD-10-CM | POA: Diagnosis not present

## 2018-04-27 DIAGNOSIS — H209 Unspecified iridocyclitis: Secondary | ICD-10-CM | POA: Diagnosis not present

## 2018-05-19 DIAGNOSIS — H40051 Ocular hypertension, right eye: Secondary | ICD-10-CM | POA: Diagnosis not present

## 2018-05-19 DIAGNOSIS — H209 Unspecified iridocyclitis: Secondary | ICD-10-CM | POA: Diagnosis not present

## 2018-06-12 DIAGNOSIS — H40051 Ocular hypertension, right eye: Secondary | ICD-10-CM | POA: Diagnosis not present

## 2018-06-19 ENCOUNTER — Ambulatory Visit: Payer: Medicare Other | Admitting: Podiatry

## 2018-07-10 ENCOUNTER — Ambulatory Visit (INDEPENDENT_AMBULATORY_CARE_PROVIDER_SITE_OTHER): Payer: Medicare Other | Admitting: Podiatry

## 2018-07-10 DIAGNOSIS — L989 Disorder of the skin and subcutaneous tissue, unspecified: Secondary | ICD-10-CM | POA: Diagnosis not present

## 2018-07-10 DIAGNOSIS — M79676 Pain in unspecified toe(s): Secondary | ICD-10-CM

## 2018-07-10 DIAGNOSIS — E0842 Diabetes mellitus due to underlying condition with diabetic polyneuropathy: Secondary | ICD-10-CM | POA: Diagnosis not present

## 2018-07-10 DIAGNOSIS — B351 Tinea unguium: Secondary | ICD-10-CM | POA: Diagnosis not present

## 2018-07-11 NOTE — Progress Notes (Signed)
    Subjective: Patient is a 67 y.o. male presenting to the office today with a chief complaint of painful callus lesions to the bilateral feet that have been present for several months. Walking and bearing weight for long periods of time increases the pain. He has not done anything at home for treatment.  Patient also complains of elongated, thickened nails that cause pain while ambulating in shoes. He is unable to trim his own nails. Patient presents today for further treatment and evaluation.   Past Medical History:  Diagnosis Date  . Duodenitis   . Essential hypertension 07/16/2015  . H/O: substance abuse   . History of frostbite   . History of radiation therapy nov 2015 to feb 2016  . Iron deficiency anemia   . Peripheral neuropathy   . Prostate cancer ([place]) 03/05/14   Gleason 7, volume 45 gm  . S/P radiation therapy 09/18/14-11/15/14   prostate/ 7800Gy/40sessions  . Thrombocytopenia ([place])     Objective:  Physical Exam General: Alert and oriented x3 in no acute distress  Dermatology: Hyperkeratotic lesions x 2 present on the bilateral feet. Pain on palpation with a central nucleated core noted. Skin is warm, dry and supple bilateral lower extremities. Negative for open lesions or macerations. Nails are tender, long, thickened and dystrophic with subungual debris, consistent with onychomycosis, 1-5 bilateral. No signs of infection noted.  Vascular: Palpable pedal pulses bilaterally. No edema or erythema noted. Capillary refill within normal limits.  Neurological: Epicritic and protective threshold grossly intact bilaterally.   Musculoskeletal Exam: Pain on palpation at the keratotic lesion noted. Range of motion within normal limits bilateral. Muscle strength 5/5 in all groups bilateral.  Assessment: 1. Onychodystrophic nails 1-5 bilateral with hyperkeratosis of nails.  2. Onychomycosis of nail due to dermatophyte bilateral 3. Pre-ulcerative calluses x 2 to the bilateral  feet   Plan of Care:  #1 Patient evaluated. #2 Excisional debridement of keratoic lesion using a chisel blade was performed without incident.  #3 Dressed with light dressing. #4 Mechanical debridement of nails 1-5 bilaterally performed using a nail nipper. Filed with dremel without incident.  #5 Patient is to return to the clinic in 3 months.   Edrick Kins, DPM [facility]  Dr. Edrick Kins, [place]                                        [place], [place] [address]                Office [phone]  Fax [phone]

## 2018-07-24 DIAGNOSIS — H40051 Ocular hypertension, right eye: Secondary | ICD-10-CM | POA: Diagnosis not present

## 2018-08-14 ENCOUNTER — Ambulatory Visit: Payer: Self-pay

## 2018-08-21 ENCOUNTER — Encounter (INDEPENDENT_AMBULATORY_CARE_PROVIDER_SITE_OTHER): Payer: Self-pay

## 2018-08-21 ENCOUNTER — Ambulatory Visit (INDEPENDENT_AMBULATORY_CARE_PROVIDER_SITE_OTHER): Payer: Medicare Other | Admitting: Internal Medicine

## 2018-08-21 ENCOUNTER — Other Ambulatory Visit: Payer: Self-pay

## 2018-08-21 ENCOUNTER — Encounter: Payer: Self-pay | Admitting: Internal Medicine

## 2018-08-21 VITALS — BP 143/93 | HR 67 | Temp 97.8°F | Ht 69.0 in | Wt 177.8 lb

## 2018-08-21 DIAGNOSIS — I10 Essential (primary) hypertension: Secondary | ICD-10-CM | POA: Diagnosis not present

## 2018-08-21 DIAGNOSIS — D72819 Decreased white blood cell count, unspecified: Secondary | ICD-10-CM | POA: Diagnosis not present

## 2018-08-21 DIAGNOSIS — H409 Unspecified glaucoma: Secondary | ICD-10-CM

## 2018-08-21 DIAGNOSIS — Z0000 Encounter for general adult medical examination without abnormal findings: Secondary | ICD-10-CM

## 2018-08-21 DIAGNOSIS — Z923 Personal history of irradiation: Secondary | ICD-10-CM

## 2018-08-21 DIAGNOSIS — Z79899 Other long term (current) drug therapy: Secondary | ICD-10-CM

## 2018-08-21 DIAGNOSIS — L300 Nummular dermatitis: Secondary | ICD-10-CM

## 2018-08-21 DIAGNOSIS — D509 Iron deficiency anemia, unspecified: Secondary | ICD-10-CM

## 2018-08-21 DIAGNOSIS — Z23 Encounter for immunization: Secondary | ICD-10-CM

## 2018-08-21 DIAGNOSIS — Z8546 Personal history of malignant neoplasm of prostate: Secondary | ICD-10-CM

## 2018-08-21 MED ORDER — TRIAMCINOLONE ACETONIDE 0.025 % EX OINT: 1.0000 "application " | TOPICAL_OINTMENT | Freq: Two times a day (BID) | CUTANEOUS | 1 refills | Status: DC

## 2018-08-21 MED ORDER — ZOSTER VAC RECOMB ADJUVANTED 50 MCG/0.5ML IM SUSR: 0.5000 mL | Freq: Once | INTRAMUSCULAR | 0 refills | Status: AC

## 2018-08-21 NOTE — Patient Instructions (Signed)
It was a pleasure meeting you today, William Jones!  1. For your high blood pressure - Avoid salty foods (information about the low-sodium DASH diet is provided below) - Follow-up with me in 3 months for a blood pressure check  2. For your rash - I have written a prescription for triamcinolone, which is a steroid cream. You can put this on your patches of dry skin twice a day until they stop itching and don't feel like sandpaper.   3. For your pneumonia vaccine - Come back for a nursing visit in one week to get your pneumonia vaccine.  4. For your shingles vaccine - I have written a prescription for the vaccine and printed this out for you - Take this prescription to your pharmacy and they will give you the vaccine.  Feel free to call our clinic if you have any questions at [phone].  Thanks! Dr. Annie Paras   DASH Eating Plan DASH stands for "Dietary Approaches to Stop Hypertension." The DASH eating plan is a healthy eating plan that has been shown to reduce high blood pressure (hypertension). It may also reduce your risk for type 2 diabetes, heart disease, and stroke. The DASH eating plan may also help with weight loss. What are tips for following this plan? General guidelines  Avoid eating more than 2,300 mg (milligrams) of salt (sodium) a day. If you have hypertension, you may need to reduce your sodium intake to 1,500 mg a day.  Limit alcohol intake to no more than 1 drink a day for nonpregnant women and 2 drinks a day for men. One drink equals 12 oz of beer, 5 oz of wine, or 1 oz of hard liquor.  Work with your health care provider to maintain a healthy body weight or to lose weight. Ask what an ideal weight is for you.  Get at least 30 minutes of exercise that causes your heart to beat faster (aerobic exercise) most days of the week. Activities may include walking, swimming, or biking.  Work with your health care provider or diet and nutrition specialist (dietitian) to  adjust your eating plan to your individual calorie needs. Reading food labels  Check food labels for the amount of sodium per serving. Choose foods with less than 5 percent of the Daily Value of sodium. Generally, foods with less than 300 mg of sodium per serving fit into this eating plan.  To find whole grains, look for the word "whole" as the first word in the ingredient list. Shopping  Buy products labeled as "low-sodium" or "no salt added."  Buy fresh foods. Avoid canned foods and premade or frozen meals. Cooking  Avoid adding salt when cooking. Use salt-free seasonings or herbs instead of table salt or sea salt. Check with your health care provider or pharmacist before using salt substitutes.  Do not fry foods. Cook foods using healthy methods such as baking, boiling, grilling, and broiling instead.  Cook with heart-healthy oils, such as olive, canola, soybean, or sunflower oil. Meal planning   Eat a balanced diet that includes: ? 5 or more servings of fruits and vegetables each day. At each meal, try to fill half of your plate with fruits and vegetables. ? Up to 6-8 servings of whole grains each day. ? Less than 6 oz of lean meat, poultry, or fish each day. A 3-oz serving of meat is about the same size as a deck of cards. One egg equals 1 oz. ? 2 servings of low-fat dairy each day. ?  A serving of nuts, seeds, or beans 5 times each week. ? Heart-healthy fats. Healthy fats called Omega-3 fatty acids are found in foods such as flaxseeds and coldwater fish, like sardines, salmon, and mackerel.  Limit how much you eat of the following: ? Canned or prepackaged foods. ? Food that is high in trans fat, such as fried foods. ? Food that is high in saturated fat, such as fatty meat. ? Sweets, desserts, sugary drinks, and other foods with added sugar. ? Full-fat dairy products.  Do not salt foods before eating.  Try to eat at least 2 vegetarian meals each week.  Eat more home-cooked  food and less restaurant, buffet, and fast food.  When eating at a restaurant, ask that your food be prepared with less salt or no salt, if possible. What foods are recommended? The items listed may not be a complete list. Talk with your dietitian about what dietary choices are best for you. Grains Whole-grain or whole-wheat bread. Whole-grain or whole-wheat pasta. Brown rice. [place]ena Morrow. Bulgur. Whole-grain and low-sodium cereals. Pita bread. Low-fat, low-sodium crackers. Whole-wheat flour tortillas. Vegetables Fresh or frozen vegetables (raw, steamed, roasted, or grilled). Low-sodium or reduced-sodium tomato and vegetable juice. Low-sodium or reduced-sodium tomato sauce and tomato paste. Low-sodium or reduced-sodium canned vegetables. Fruits All fresh, dried, or frozen fruit. Canned fruit in natural juice (without added sugar). Meat and other protein foods Skinless chicken or [place]. Ground chicken or [place]. Pork with fat trimmed off. Fish and seafood. Egg whites. Dried beans, peas, or lentils. Unsalted nuts, nut butters, and seeds. Unsalted canned beans. Lean cuts of beef with fat trimmed off. Low-sodium, lean deli meat. Dairy Low-fat (1%) or fat-free (skim) milk. Fat-free, low-fat, or reduced-fat cheeses. Nonfat, low-sodium ricotta or cottage cheese. Low-fat or nonfat yogurt. Low-fat, low-sodium cheese. Fats and oils Soft margarine without trans fats. Vegetable oil. Low-fat, reduced-fat, or light mayonnaise and salad dressings (reduced-sodium). Canola, safflower, olive, soybean, and sunflower oils. Avocado. Seasoning and other foods Herbs. Spices. Seasoning mixes without salt. Unsalted popcorn and pretzels. Fat-free sweets. What foods are not recommended? The items listed may not be a complete list. Talk with your dietitian about what dietary choices are best for you. Grains Baked goods made with fat, such as croissants, muffins, or some breads. Dry pasta or rice meal  packs. Vegetables Creamed or fried vegetables. Vegetables in a cheese sauce. Regular canned vegetables (not low-sodium or reduced-sodium). Regular canned tomato sauce and paste (not low-sodium or reduced-sodium). Regular tomato and vegetable juice (not low-sodium or reduced-sodium). Angie Fava. Olives. Fruits Canned fruit in a light or heavy syrup. Fried fruit. Fruit in cream or butter sauce. Meat and other protein foods Fatty cuts of meat. Ribs. Fried meat. Berniece Salines. Sausage. Bologna and other processed lunch meats. Salami. Fatback. Hotdogs. Bratwurst. Salted nuts and seeds. Canned beans with added salt. Canned or smoked fish. Whole eggs or egg yolks. Chicken or [place] with skin. Dairy Whole or 2% milk, cream, and half-and-half. Whole or full-fat cream cheese. Whole-fat or sweetened yogurt. Full-fat cheese. Nondairy creamers. Whipped toppings. Processed cheese and cheese spreads. Fats and oils Butter. Stick margarine. Lard. Shortening. Ghee. Bacon fat. Tropical oils, such as coconut, palm kernel, or palm oil. Seasoning and other foods Salted popcorn and pretzels. Onion salt, garlic salt, seasoned salt, table salt, and sea salt. Worcestershire sauce. Tartar sauce. Barbecue sauce. Teriyaki sauce. Soy sauce, including reduced-sodium. Steak sauce. Canned and packaged gravies. Fish sauce. Oyster sauce. Cocktail sauce. Horseradish that you find on the shelf. Ketchup.  Mustard. Meat flavorings and tenderizers. Bouillon cubes. Hot sauce and Tabasco sauce. Premade or packaged marinades. Premade or packaged taco seasonings. Relishes. Regular salad dressings. Where to find more information:  National Heart, Lung, and [place]: https://wilson-eaton.com/  American Heart Association: www.heart.org Summary  The DASH eating plan is a healthy eating plan that has been shown to reduce high blood pressure (hypertension). It may also reduce your risk for type 2 diabetes, heart disease, and stroke.  With the DASH eating  plan, you should limit salt (sodium) intake to 2,300 mg a day. If you have hypertension, you may need to reduce your sodium intake to 1,500 mg a day.  When on the DASH eating plan, aim to eat more fresh fruits and vegetables, whole grains, lean proteins, low-fat dairy, and heart-healthy fats.  Work with your health care provider or diet and nutrition specialist (dietitian) to adjust your eating plan to your individual calorie needs. This information is not intended to replace advice given to you by your health care provider. Make sure you discuss any questions you have with your health care provider. Document Released: 09/09/2011 Document Revised: 09/13/2016 Document Reviewed: 09/13/2016 Elsevier Interactive Patient Education  [employer].

## 2018-08-21 NOTE — Assessment & Plan Note (Signed)
[name] reports that he saw an ophthalmologist over the summer and was diagnosed with glaucoma. He was prescribed multiple eye drops for this problem. He states that his headaches have resolved since starting this therapy. He denies changes in his vision. He follows with Dr. Alanda Slim at [facility].   Plan 1. Continue management by Dr. Alanda Slim of [facility].

## 2018-08-21 NOTE — Assessment & Plan Note (Signed)
Pt received flu vaccine today. He would also like the pneumonia vaccine, but not today. He asked about the shingles vaccine, and I provided a prescription for Shingrix. He has never had Shingles before.   Plan 1. Nursing visit in 1 week for pneumonia vaccine 2. Provided prescription for Shingrix

## 2018-08-21 NOTE — Assessment & Plan Note (Signed)
Last CBC in 02/2018 showed a stable white count of 3.1.  Plan 1. Repeat CBC at next visit

## 2018-08-21 NOTE — Assessment & Plan Note (Signed)
BP today is 140/94 and 143/93 on recheck. The patient reports that he self-discontinued his amlodipine because he doesn't like taking medications and he thought the amlodipine was causing side effects of headaches and eczema flares. I counseled the patient about the risks of HTN. He would like to try lifestyle modifications before reconsidering medications. I provided the patient with information about the DASH diet.   Plan 1. Lifestyle modifications (DASH diet) 2. BP check in 3 months

## 2018-08-21 NOTE — Progress Notes (Signed)
   CC: routine check-up  HPI:   [name] is a 67 y.o. male with a medical history of HTN and prostate cancer s/p radiation in 2015 as well as the other medical conditions listed below who presents to the internal medicine clinic for a routine check-up. We discussed HTN, eczema, and healthcare maintenance at this visit. Please see problem based charting for the history and status of the patient's current and chronic medical conditions.   Past Medical History:  Diagnosis Date  . Duodenitis   . Essential hypertension 07/16/2015  . H/O: substance abuse ([place])   . History of frostbite   . History of radiation therapy nov 2015 to feb 2016  . Iron deficiency anemia   . Peripheral neuropathy   . Prostate cancer ([place]) 03/05/14   Gleason 7, volume 45 gm  . Rash and nonspecific skin eruption 08/04/2017  . S/P radiation therapy 09/18/14-11/15/14   prostate/ 7800Gy/40sessions  . Thrombocytopenia ([place])     Review of Systems:   Pertinent positives mentioned in HPI. Remainder of all ROS negative.  Physical Exam: Vitals:   08/21/18 0903 08/21/18 0935  BP: (!) 140/94 (!) 143/93  Pulse: 65 67  Temp: 97.8 F (36.6 C)   TempSrc: Oral   SpO2: 100%   Weight: 177 lb 12.8 oz (80.6 kg)   Height: 5\' 9"  (1.753 m)    Physical Exam  Constitutional: Well-developed, well-nourished, and in no distress.  Eyes: Pupils are equal, round, and reactive to light. EOM are normal.  Cardiovascular: Normal rate and regular rhythm. No murmurs, rubs, or gallops. Pulmonary/Chest: Effort normal. Clear to auscultation bilaterally. No wheezes, rales, or rhonchi. Abdominal: Soft, non-distended, non-tender. Ext: No lower extremity edema. Skin: Warm and dry. There is a hyperpigmented circular plaque on the right inner elbow.    Assessment & Plan:   See Encounters Tab for problem based charting.  Patient seen with Dr. Daryll Drown

## 2018-08-21 NOTE — Assessment & Plan Note (Addendum)
William Jones reports that his patches of dry itchy skin come and go. He currently has one on the inside of his right elbow. He uses vaseline on the lesions when they are active.   Plan 1. Triamcinolone ointment BID to affected areas

## 2018-08-21 NOTE — Assessment & Plan Note (Signed)
The patient continues to take his iron supplement. His last CBC in 02/2018 showed a Hb of 13.5.  Plan 1. Continue iron supplementation

## 2018-08-23 NOTE — Progress Notes (Signed)
Internal Medicine Clinic Attending  I saw and evaluated the patient.  I personally confirmed the key portions of the history and exam documented by Dr. Dorrell and I reviewed pertinent patient test results.  The assessment, diagnosis, and plan were formulated together and I agree with the documentation in the resident's note. 

## 2018-08-28 ENCOUNTER — Encounter (INDEPENDENT_AMBULATORY_CARE_PROVIDER_SITE_OTHER): Payer: Self-pay

## 2018-08-28 ENCOUNTER — Ambulatory Visit (INDEPENDENT_AMBULATORY_CARE_PROVIDER_SITE_OTHER): Payer: Medicare Other | Admitting: *Deleted

## 2018-08-28 DIAGNOSIS — Z23 Encounter for immunization: Secondary | ICD-10-CM | POA: Diagnosis not present

## 2018-08-28 NOTE — Progress Notes (Signed)
PCV 13 given to pt per vo Dr Annie Paras.

## 2018-10-09 ENCOUNTER — Encounter: Payer: Self-pay | Admitting: Podiatry

## 2018-10-09 ENCOUNTER — Ambulatory Visit (INDEPENDENT_AMBULATORY_CARE_PROVIDER_SITE_OTHER): Payer: Medicare Other | Admitting: Podiatry

## 2018-10-09 DIAGNOSIS — M79676 Pain in unspecified toe(s): Secondary | ICD-10-CM

## 2018-10-09 DIAGNOSIS — E0842 Diabetes mellitus due to underlying condition with diabetic polyneuropathy: Secondary | ICD-10-CM | POA: Diagnosis not present

## 2018-10-09 DIAGNOSIS — B351 Tinea unguium: Secondary | ICD-10-CM

## 2018-10-11 NOTE — Progress Notes (Signed)
   SUBJECTIVE Patient presents to office today complaining of elongated, thickened nails that cause pain while ambulating in shoes. He is unable to trim his own nails. Patient is here for further evaluation and treatment.  Past Medical History:  Diagnosis Date  . Duodenitis   . Essential hypertension 07/16/2015  . H/O: substance abuse ([place])   . History of frostbite   . History of radiation therapy nov 2015 to feb 2016  . Iron deficiency anemia   . Peripheral neuropathy   . Prostate cancer ([place]) 03/05/14   Gleason 7, volume 45 gm  . Rash and nonspecific skin eruption 08/04/2017  . S/P radiation therapy 09/18/14-11/15/14   prostate/ 7800Gy/40sessions  . Thrombocytopenia ([place])     OBJECTIVE General Patient is awake, alert, and oriented x 3 and in no acute distress. Derm Skin is dry and supple bilateral. Negative open lesions or macerations. Remaining integument unremarkable. Nails are tender, long, thickened and dystrophic with subungual debris, consistent with onychomycosis, 1-5 bilateral. No signs of infection noted. Vasc  DP and PT pedal pulses palpable bilaterally. Temperature gradient within normal limits.  Neuro Epicritic and protective threshold sensation grossly intact bilaterally.  Musculoskeletal Exam No symptomatic pedal deformities noted bilateral. Muscular strength within normal limits.  ASSESSMENT 1. Onychodystrophic nails 1-5 bilateral with hyperkeratosis of nails.  2. Onychomycosis of nail due to dermatophyte bilateral 3. Pain in foot bilateral  PLAN OF CARE 1. Patient evaluated today.  2. Instructed to maintain good pedal hygiene and foot care.  3. Mechanical debridement of nails 1-5 bilaterally performed using a nail nipper. Filed with dremel without incident.  4. Return to clinic in 3 mos.    Edrick Kins, DPM [facility]  Dr. Edrick Kins, [place]                                        [place], [place] [address]                  Office [phone]  Fax [phone]

## 2018-10-16 ENCOUNTER — Other Ambulatory Visit: Payer: Self-pay | Admitting: Urology

## 2018-10-16 DIAGNOSIS — C61 Malignant neoplasm of prostate: Secondary | ICD-10-CM

## 2018-10-30 ENCOUNTER — Encounter (HOSPITAL_COMMUNITY)
Admission: RE | Admit: 2018-10-30 | Discharge: 2018-10-30 | Disposition: A | Discharge status: Home / Self Care | Payer: Medicare Other | Source: Ambulatory Visit | Attending: Urology | Admitting: Urology

## 2018-10-30 DIAGNOSIS — C61 Malignant neoplasm of prostate: Secondary | ICD-10-CM | POA: Insufficient documentation

## 2018-10-30 MED ORDER — TECHNETIUM TC 99M MEDRONATE IV KIT
22.0000 | PACK | Freq: Once | INTRAVENOUS | Status: AC
  Administered 2018-10-30: 22 via INTRAVENOUS

## 2018-11-01 ENCOUNTER — Other Ambulatory Visit: Payer: Self-pay | Admitting: Urology

## 2018-11-01 DIAGNOSIS — R9721 Rising PSA following treatment for malignant neoplasm of prostate: Secondary | ICD-10-CM

## 2018-11-13 ENCOUNTER — Ambulatory Visit
Admission: RE | Admit: 2018-11-13 | Discharge: 2018-11-13 | Disposition: A | Discharge status: Home / Self Care | Payer: Medicare Other | Source: Ambulatory Visit | Attending: Urology | Admitting: Urology

## 2018-11-13 DIAGNOSIS — R9721 Rising PSA following treatment for malignant neoplasm of prostate: Secondary | ICD-10-CM

## 2018-11-13 MED ORDER — GADOBENATE DIMEGLUMINE 529 MG/ML IV SOLN
15.0000 mL | Freq: Once | INTRAVENOUS | Status: AC | PRN
  Administered 2018-11-13: 15 mL via INTRAVENOUS

## 2018-11-16 DIAGNOSIS — C7951 Secondary malignant neoplasm of bone: Secondary | ICD-10-CM | POA: Diagnosis not present

## 2018-11-21 ENCOUNTER — Telehealth: Payer: Self-pay | Admitting: Oncology

## 2018-11-21 ENCOUNTER — Encounter: Payer: Self-pay | Admitting: Oncology

## 2018-11-21 NOTE — Telephone Encounter (Signed)
Received a new patient referral from Dr. Lovena Neighbours at [facility] for met prostate cancer. Pt has been cld and scheduled to see Dr. Alen Blew on 2/28 at 11am. Pt aware to arrive 30 minutes early. Letter mailed.

## 2018-12-01 ENCOUNTER — Inpatient Hospital Stay: Payer: Medicare Other | Attending: Oncology | Admitting: Oncology

## 2018-12-01 ENCOUNTER — Telehealth: Payer: Self-pay | Admitting: Oncology

## 2018-12-01 VITALS — BP 137/98 | HR 80 | Temp 98.1°F | Resp 18 | Ht 69.0 in | Wt 177.4 lb

## 2018-12-01 DIAGNOSIS — C61 Malignant neoplasm of prostate: Secondary | ICD-10-CM | POA: Diagnosis not present

## 2018-12-01 DIAGNOSIS — C7951 Secondary malignant neoplasm of bone: Secondary | ICD-10-CM

## 2018-12-01 DIAGNOSIS — I10 Essential (primary) hypertension: Secondary | ICD-10-CM | POA: Diagnosis not present

## 2018-12-01 DIAGNOSIS — Z87891 Personal history of nicotine dependence: Secondary | ICD-10-CM

## 2018-12-01 DIAGNOSIS — Z923 Personal history of irradiation: Secondary | ICD-10-CM | POA: Insufficient documentation

## 2018-12-01 NOTE — Progress Notes (Signed)
Reason for the request: Prostate cancer  HPI: I was asked by Dr. Lovena Neighbours  to evaluate William Jones for prostate cancer.  He is a 68 year old man with history of hypertension and neuropathy who was diagnosed with prostate cancer in June 2015.  He had a PSA of 42.9 and a biopsy at that time showed a Gleason score 4+3 = 7.  He received definitive therapy with radiation and androgen deprivation therapy for 2 years.  He completed radiation therapy in 2016 and androgen deprivation in 2018.  He has been following with Dr. Lovena Neighbours regarding this issue with a PSA nadir of undetectable in 2017.  His PSA started to rise in 2018-0.1 and in June 2019 was 0.23.  In January 2020 his PSA was up to 0.86.  Staging work-up performed by Dr. Lovena Neighbours including CT scan of the abdomen and pelvis which showed a new right sacral sclerosis suspicious for malignancy.  MRI on 11/13/2018 showed a new sclerotic lesion in the superior aspect of the right sacrum consistent with solitary metastasis.  Bone scan October 30, 2018 showed focal area of uptake in the right side of the sacrum but no other areas of uptake.  Clinically he is completely asymptomatic from these findings and was started on androgen deprivation under the care of Dr. Lovena Neighbours in the last 2 weeks.  He denies any bone pain or pathological fractures.  He denies any excessive fatigue or tiredness.  He denies any urinary symptoms.   He does not report any headaches, blurry vision, syncope or seizures. Does not report any fevers, chills or sweats.  Does not report any cough, wheezing or hemoptysis.  Does not report any chest pain, palpitation, orthopnea or leg edema.  Does not report any nausea, vomiting or abdominal pain.  Does not report any constipation or diarrhea.  Does not report any skeletal complaints.    Does not report frequency, urgency or hematuria.  Does not report any skin rashes or lesions. Does not report any heat or cold intolerance.  Does not report any  lymphadenopathy or petechiae.  Does not report any anxiety or depression.  Remaining review of systems is negative.    Past Medical History:  Diagnosis Date  . Duodenitis   . Essential hypertension 07/16/2015  . H/O: substance abuse ([place])   . History of frostbite   . History of radiation therapy nov 2015 to feb 2016  . Iron deficiency anemia   . Peripheral neuropathy   . Prostate cancer ([name]) 03/05/14   Gleason 7, volume 45 gm  . Rash and nonspecific skin eruption 08/04/2017  . S/P radiation therapy 09/18/14-11/15/14   prostate/ 7800Gy/40sessions  . Thrombocytopenia ([place])   :  Past Surgical History:  Procedure Laterality Date  . COLONOSCOPY N/A 01/10/2014   Procedure: COLONOSCOPY;  Surgeon: [name], MD;  Location: [facility];  Service: Endoscopy;  Laterality: N/A;  . ESOPHAGOGASTRODUODENOSCOPY N/A 01/10/2014   Procedure: ESOPHAGOGASTRODUODENOSCOPY (EGD);  Surgeon: [name], MD;  Location: [facility]COPY;  Service: Endoscopy;  Laterality: N/A;  . FLEXIBLE SIGMOIDOSCOPY N/A 09/19/2015   Procedure: FLEXIBLE SIGMOIDOSCOPY;  Surgeon: Carol Ada, MD;  Location: [facility] ENDOSCOPY;  Service: Endoscopy;  Laterality: N/A;  . FLEXIBLE SIGMOIDOSCOPY N/A 06/03/2017   Procedure: FLEXIBLE SIGMOIDOSCOPY;  Surgeon: Carol Ada, MD;  Location: [facility] ENDOSCOPY;  Service: Endoscopy;  Laterality: N/A;  . FLEXIBLE SIGMOIDOSCOPY N/A 01/06/2018   Procedure: FLEXIBLE SIGMOIDOSCOPY;  Surgeon: Carol Ada, MD;  Location: [facility] ENDOSCOPY;  Service: Endoscopy;  Laterality: N/A;  . GIVENS  CAPSULE STUDY N/A 01/10/2014   Procedure: GIVENS CAPSULE STUDY;  Surgeon: [name], MD;  Location: [facility];  Service: Endoscopy;  Laterality: N/A;  . HOT HEMOSTASIS N/A 09/19/2015   Procedure: HOT HEMOSTASIS (ARGON PLASMA COAGULATION/BICAP);  Surgeon: Carol Ada, MD;  Location: [facility];  Service: Endoscopy;  Laterality: N/A;  . HOT HEMOSTASIS N/A 06/03/2017   Procedure: HOT HEMOSTASIS (ARGON PLASMA  COAGULATION/BICAP);  Surgeon: Carol Ada, MD;  Location: [facility];  Service: Endoscopy;  Laterality: N/A;  . PROSTATE BIOPSY  03/05/14   Gleason 7, vol 45 gm  :   Current Outpatient Medications:  .  BETIMOL 0.5 % ophthalmic solution, INT 1 GTT IN OD BID, Disp: , Rfl: 5 .  brimonidine (ALPHAGAN) 0.2 % ophthalmic solution, INT 1 GTT INTO THE RIGHT EYE BID, Disp: , Rfl: 0 .  erythromycin ophthalmic ointment, Place a 1/2 inch ribbon of ointment into the lower eyelid., Disp: 3.5 g, Rfl: 0 .  ferrous sulfate 325 (65 FE) MG tablet, Take 1 tablet (325 mg total) by mouth daily with breakfast., Disp: 90 tablet, Rfl: 3 .  prednisoLONE acetate (PRED FORTE) 1 % ophthalmic suspension, SHAKE LQ AND INT 1 GTT IN OD QID, Disp: , Rfl: 3 .  timolol (TIMOPTIC) 0.5 % ophthalmic solution, PLACE ONE DROP 2 TIMES INTO THE RIGHT EYE UTD, Disp: , Rfl: 7 .  triamcinolone (KENALOG) 0.025 % ointment, Apply 1 application topically 2 (two) times daily. Apply to affected area twice a day., Disp: 30 g, Rfl: 1 .  valACYclovir (VALTREX) 1000 MG tablet, TK 1 T PO TID, Disp: , Rfl: 0:  No Known Allergies:  Family History  Problem Relation Age of Onset  . Kidney failure Brother 23       has been on HD since age 18   . Stroke Maternal Uncle        70s-80s  :  Social History   Socioeconomic History  . Marital status: Widowed    Spouse name: Not on file  . Number of children: Not on file  . Years of education: Not on file  . Highest education level: Not on file  Occupational History  . Occupation: retired    Fish farm manager: UNEMPLOYED  Social Needs  . Financial resource strain: Not on file  . Food insecurity:    Worry: Not on file    Inability: Not on file  . Transportation needs:    Medical: Not on file    Non-medical: Not on file  Tobacco Use  . Smoking status: Former Smoker    Years: 30.00    Types: Cigarettes    Last attempt to quit: 10/05/2007    Years since quitting: 11.1  . Smokeless tobacco: Never  Used  Substance and Sexual Activity  . Alcohol use: Yes    Alcohol/week: 0.0 standard drinks    Comment: no alcohol for one year  . Drug use: No    Types: "Crack" cocaine, Marijuana    Comment: per Epic note 2009hx of crack cocaine use 12 years ago per pt, no current marijuana use per pt  . Sexual activity: Yes    Birth control/protection: Condom  Lifestyle  . Physical activity:    Days per week: Not on file    Minutes per session: Not on file  . Stress: Not on file  Relationships  . Social connections:    Talks on phone: Not on file    Gets together: Not on file    Attends religious service:  Not on file    Active member of club or organization: Not on file    Attends meetings of clubs or organizations: Not on file    Relationship status: Not on file  . Intimate partner violence:    Fear of current or ex partner: Not on file    Emotionally abused: Not on file    Physically abused: Not on file    Forced sexual activity: Not on file  Other Topics Concern  . Not on file  Social History Narrative   On disability - frostbite. Used to work for [employer]. Retired in 1990. Has 2 kids. 6th grade.   :  Pertinent items are noted in HPI.  Exam: Blood pressure (!) 137/98, pulse 80, temperature 98.1 F (36.7 C), temperature source Oral, resp. rate 18, height 5\' 9"  (1.753 m), weight 177 lb 6.4 oz (80.5 kg), SpO2 96 %.  ECOG 0  General appearance: alert and cooperative appeared without distress. Head: atraumatic without any abnormalities. Eyes: conjunctivae/corneas clear. PERRL.  Sclera anicteric. Throat: lips, mucosa, and tongue normal; without oral thrush or ulcers. Resp: clear to auscultation bilaterally without rhonchi, wheezes or dullness to percussion. Cardio: regular rate and rhythm, S1, S2 normal, no murmur, click, rub or gallop GI: soft, non-tender; bowel sounds normal; no masses,  no organomegaly Skin: Skin color, texture, turgor normal. No rashes or lesions Lymph  nodes: Cervical, supraclavicular, and axillary nodes normal. Neurologic: Grossly normal without any motor, sensory or deep tendon reflexes. Musculoskeletal: No joint deformity or effusion.    Mr Pelvis W Wo Contrast  Result Date: 11/13/2018 CLINICAL DATA:  Prostate cancer with rising serum PSA level and new sclerotic lesion in the right sacrum on CT. Creatinine was obtained on site at [facility] at [address] Results: Creatinine 0.8 mg/dL. EXAM: MRI PELVIS WITHOUT AND WITH CONTRAST TECHNIQUE: Multiplanar multisequence MR imaging of the pelvis was performed both before and after administration of intravenous contrast. CONTRAST:  71mL MULTIHANCE GADOBENATE DIMEGLUMINE 529 MG/ML IV SOLN COMPARISON:  Pelvic CT 04/25/2014 and 10/30/2018. Whole-body bone scans from those dates. FINDINGS: Urinary Tract: The visualized distal ureters and bladder appear unremarkable. Bowel: No bowel wall thickening, distention or surrounding inflammation identified within the pelvis. Vascular/Lymphatic: No enlarged pelvic lymph nodes identified. Small inguinal lymph nodes are stable, not pathologically enlarged. Aortoiliac atherosclerosis and tortuosity. No acute vascular findings. Reproductive: Prostate brachytherapy seeds are noted. There is mildly heterogeneous signal and enhancement in the gland without focal lesion. Other: No ascites. Musculoskeletal: The marrow signal is mildly heterogeneous. Focal lesion in the superior right aspect of the sacrum corresponds with the sclerotic lesion on CT and area increased bone scan activity. This lesion measures 2.9 x 2.1 x 1.6 cm and demonstrates decreased T1 signal and mildly increased T2 signal. Following contrast, there is peripheral enhancement of this lesion. No other focally suspicious lesions are identified. Degenerative disc disease noted at L5-S1 with scattered facet degenerative changes in the lower lumbar spine. IMPRESSION: 1. The new sclerotic lesion in the  superior aspect of the right sacrum demonstrates MR features consistent with a solitary metastasis. No other suspicious osseous findings are demonstrated. 2. No evidence of extra osseous metastatic disease in the pelvis. The prostate gland appears unchanged. 3. Aortoiliac atherosclerosis. Electronically Signed   By: Richardean Sale M.D.   On: 11/13/2018 09:20    Assessment and Plan:   68 year old man with the following:  1.  Prostate cancer diagnosed in June 2015.  At that time he  had a Gleason score 4+3 equal 7 and PSA 42.9.  He was treated with androgen deprivation therapy for 2 years and radiation that was completed in 2016 under the care of Dr. Valere Dross.  He has been on active surveillance since that time with a slow rise in his PSA up to 0.86 in January 2020.  Imaging studies showed an isolated metastatic lesion in the right sacrum.  The natural course of this disease was reviewed today and treatment options were discussed.  He has been started on androgen deprivation therapy given the documented metastatic disease.  The role of therapy escalation in the form of systemic chemotherapy, Nicki Reaper among other agents were reviewed today.  At this time I do not feel strongly about adding additional therapy at this time given his very low volume disease.  The role for treated isolated metastatic disease in the setting of an oligo-progression was discussed today.  I feel he would benefit from isolated local therapy in addition to androgen deprivation therapy for period of time.  If he is a radiation candidate given his radiation fields previously, then androgen deprivation therapy could be delayed or shortened duration accordingly.  If radiation cannot be done then androgen deprivation therapy would be indefinite.  After discussion today, I will refer him to radiation oncology for an evaluation regarding the role of radiation in this setting.  He is a radiation candidate, then I would recommend shorter  course of androgen deprivation for 1 to 2 years if his PSA becomes undetectable.  He understands he might be at risk of developing castration resistant disease or more advanced castration sensitive disease and additional therapy may be needed.  These therapies were discussed today which which are not indicated at this time.   2.  Follow-up: I am happy to see him in the future as needed.  60  minutes was spent with the patient face-to-face today.  More than 50% of time was dedicated to reviewing imaging studies, pathology reports, discussing treatment options and answering question regarding future plan of care.    Thank you for the referral. A copy of this consult has been forwarded to the requesting physician.

## 2018-12-01 NOTE — Telephone Encounter (Signed)
Per 2/28 [facility] Rad[facility] On[facility] referral.

## 2018-12-11 ENCOUNTER — Ambulatory Visit (INDEPENDENT_AMBULATORY_CARE_PROVIDER_SITE_OTHER): Payer: Medicare Other | Admitting: Podiatry

## 2018-12-11 ENCOUNTER — Encounter: Payer: Self-pay | Admitting: Podiatry

## 2018-12-11 DIAGNOSIS — B351 Tinea unguium: Secondary | ICD-10-CM | POA: Diagnosis not present

## 2018-12-11 DIAGNOSIS — M79676 Pain in unspecified toe(s): Secondary | ICD-10-CM

## 2018-12-11 DIAGNOSIS — L989 Disorder of the skin and subcutaneous tissue, unspecified: Secondary | ICD-10-CM | POA: Diagnosis not present

## 2018-12-11 DIAGNOSIS — E0842 Diabetes mellitus due to underlying condition with diabetic polyneuropathy: Secondary | ICD-10-CM

## 2018-12-14 NOTE — Progress Notes (Signed)
    Subjective: Patient is a 68 y.o. male presenting to the office today with a chief complaint of a painful callus lesion to the left 2nd toe that has been present for several months. Wearing shoes and walking increases the pain. He has not done anything for treatment at home.   Patient also complains of elongated, thickened nails that cause pain while ambulating in shoes. He is unable to trim his own nails. Patient presents today for further treatment and evaluation.  Past Medical History:  Diagnosis Date  . Duodenitis   . Essential hypertension 07/16/2015  . H/O: substance abuse ([place])   . History of frostbite   . History of radiation therapy nov 2015 to feb 2016  . Iron deficiency anemia   . Peripheral neuropathy   . Prostate cancer ([place]) 03/05/14   Gleason 7, volume 45 gm  . Rash and nonspecific skin eruption 08/04/2017  . S/P radiation therapy 09/18/14-11/15/14   prostate/ 7800Gy/40sessions  . Thrombocytopenia ([place])     Objective:  Physical Exam General: Alert and oriented x3 in no acute distress  Dermatology: Hyperkeratotic lesion present on the left 2nd toe. Pain on palpation with a central nucleated core noted. Skin is warm, dry and supple bilateral lower extremities. Negative for open lesions or macerations. Nails are tender, long, thickened and dystrophic with subungual debris, consistent with onychomycosis, 1-5 bilateral. No signs of infection noted.  Vascular: Palpable pedal pulses bilaterally. No edema or erythema noted. Capillary refill within normal limits.  Neurological: Epicritic and protective threshold grossly intact bilaterally.   Musculoskeletal Exam: Pain on palpation at the keratotic lesion noted. Range of motion within normal limits bilateral. Muscle strength 5/5 in all groups bilateral.  Assessment: 1. Onychodystrophic nails 1-5 bilateral with hyperkeratosis of nails.  2. Onychomycosis of nail due to dermatophyte bilateral 3. Pre-ulcerative callus lesion  noted to the left 2nd toe   Plan of Care:  1. Patient evaluated. 2. Excisional debridement of keratoic lesion using a chisel blade was performed without incident.  3. Dressed with light dressing. 4. Mechanical debridement of nails 1-5 bilaterally performed using a nail nipper. Filed with dremel without incident.  5. Patient is to return to the clinic in 3 months.   Edrick Kins, DPM [facility]  Dr. Edrick Kins, [place]                                        [place], [place] [address]                Office [phone]  Fax [phone]

## 2018-12-26 ENCOUNTER — Telehealth: Payer: Self-pay | Admitting: Radiation Oncology

## 2018-12-26 ENCOUNTER — Ambulatory Visit: Payer: Medicare Other

## 2018-12-26 ENCOUNTER — Ambulatory Visit: Payer: Medicare Other | Admitting: Radiation Oncology

## 2018-12-26 NOTE — Progress Notes (Signed)
Histology and Location of Primary Cancer: Prostatic adenocarcinoma diagnosed in June 2015. At time of diagnosis PSA was 42.9 and Gleason score 4+3.  Sites of Visceral and Bony Metastatic Disease: bony  Location(s) of Symptomatic Metastases: solitary sclerotic lesion in the superior aspect of the right sacrum  Past/Anticipated chemotherapy by medical oncology, if any: discussed the role of ADT with or without radiation therapy   Pain on a scale of 0-10 is: Denies pain but PSA continues to rise. PSA began to rise in 2018.   January 2020  PSA  0.86 June 2019  PSA  0.23    If Spine Met(s), symptoms, if any, include:  Bowel/Bladder retention or incontinence (please describe): no  Numbness or weakness in extremities (please describe): no  Current Decadron regimen, if applicable: no  Ambulatory status? Walker? Wheelchair?: Ambulatory  SAFETY ISSUES:  Prior radiation? yes, with Dr. Valere Dross in 2015-2016  Pacemaker/ICD? no  Possible current pregnancy? no, male patient  Is the patient on methotrexate? no  Current Complaints / other details:  68 year old male. Widowed but now has a significant other. Has 2 kids.  Unemployed on disability. Used to work for a [employer]. Former smoker.

## 2018-12-26 NOTE — Telephone Encounter (Signed)
In an effort to minimize the spread of COVID 68 this RN phoned patient to inquire if he has access to a computer or smart phone such that his consult can be done via WebEx. Patient denies having computer access or a smart phone. Patient is willing to be consulted over the telephone. Patient understands this RN will phone at 0830 sharp tomorrow morning to begin the consult process. Dr. Tammi Klippel and Ailene Ards, PA informed.

## 2018-12-27 ENCOUNTER — Encounter: Payer: Self-pay | Admitting: Radiation Oncology

## 2018-12-27 ENCOUNTER — Ambulatory Visit
Admission: RE | Admit: 2018-12-27 | Discharge: 2018-12-27 | Disposition: A | Discharge status: Home / Self Care | Payer: Medicare Other | Source: Ambulatory Visit | Attending: Radiation Oncology | Admitting: Radiation Oncology

## 2018-12-27 ENCOUNTER — Other Ambulatory Visit: Payer: Self-pay

## 2018-12-27 VITALS — Ht 69.0 in | Wt 173.0 lb

## 2018-12-27 DIAGNOSIS — Z923 Personal history of irradiation: Secondary | ICD-10-CM | POA: Diagnosis not present

## 2018-12-27 DIAGNOSIS — C7951 Secondary malignant neoplasm of bone: Secondary | ICD-10-CM | POA: Diagnosis not present

## 2018-12-27 DIAGNOSIS — C61 Malignant neoplasm of prostate: Secondary | ICD-10-CM

## 2018-12-27 DIAGNOSIS — C7952 Secondary malignant neoplasm of bone marrow: Principal | ICD-10-CM

## 2018-12-27 HISTORY — DX: Unspecified glaucoma: H40.9

## 2018-12-27 NOTE — Progress Notes (Signed)
See progress note under physician encounter. 

## 2018-12-29 DIAGNOSIS — C7951 Secondary malignant neoplasm of bone: Secondary | ICD-10-CM | POA: Insufficient documentation

## 2018-12-29 DIAGNOSIS — C61 Malignant neoplasm of prostate: Secondary | ICD-10-CM | POA: Insufficient documentation

## 2018-12-29 NOTE — Progress Notes (Signed)
Radiation Oncology         [phone] ________________________________  Initial Outpatient Consultation - Conducted via telephone due to current COVID-19 concerns for limiting patient exposure  Name: [facility] MRN: [id number]  Date: 12/27/2018  DOB: 1951-07-01  PN:TIRWERX, William Elk, MD  William Portela, MD   REFERRING PHYSICIAN: Wyatt Portela, MD  DIAGNOSIS: 68 y.o. gentleman with oligometastatic disease in the sacrum from Stage cT3a adenocarcinoma of the prostate.    ICD-10-CM   1. Secondary malignant neoplasm of bone and bone marrow (HCC) C79.51    C79.52     HISTORY OF PRESENT ILLNESS: [facility] is a 68 y.o. male with a history of prostate cancer diagnosed in June 2015, with high volume, Gleason 4+3 and PSA of 42.9. He was treated with definitive radiation with Dr. Valere Jones, completed in 11/2014, in combination with LT-ADT, completed in 2018.  His PSA nadir was undetectable in 2017 but began to rise shortly after completion of ADT, with a PSA up to 0.23 in 03/23/2018 and further elevated to 0.86 in 10/2018. His care was transitioned over to Dr. Lovena Jones in late 2018 he has continued in close follow up since that time.  Disease restaging with CT A/P on 10/30/2018, showed a new right sacral sclerosis suspicious for malignancy. Bone scan also performed on 10/30/2018 showed a focal area of uptake in the right side of the sacrum corresponding with the area of sclerosis seen on the recent CT and suspicious for solitary bone metastasis but no other areas of concern.  MRI Pelvis on 11/13/2018 confirmed a new sclerotic lesion in the superior aspect of the right sacrum consistent with solitary metastasis.  No other suspicious osseous findings were demonstrated.  He was restarted on ADT with Lupron at [facility] on 11/21/2018 and referral was made to medical oncology for discussion of the potential for addition of systemic chemotherapy. He met with Dr. Alen Jones on 12/01/2018,  whose recommendation was to reserve additional systemic therapy at this time given his very low volume disease and consider in the future if his PSA continues to rise or other evidence of disease progression despite ADT.    He has been referred today to discuss the potential option of stereotactic radiotherapy in the management of oligometastatic prostate cancer.  PREVIOUS RADIATION THERAPY: Yes  09/18/2014 - 11/15/2014 (Dr. Valere Jones):  1. Prostate / 78 Gy in 40 fractions 2. Seminal vesicles and pelvic lymph nodes / 56 Gy in 40 fractions  PAST MEDICAL HISTORY:  Past Medical History:  Diagnosis Date   Duodenitis    Essential hypertension 07/16/2015   Glaucoma    H/O: substance abuse ([facility])    History of frostbite    History of radiation therapy nov 2015 to feb 2016   Iron deficiency anemia    Peripheral neuropathy    Prostate cancer ([facility]) 03/05/14   Gleason 7, volume 45 gm   Rash and nonspecific skin eruption 08/04/2017   S/P radiation therapy 09/18/14-11/15/14   prostate/ 7800Gy/40sessions   Thrombocytopenia ([facility])       PAST SURGICAL HISTORY: Past Surgical History:  Procedure Laterality Date   COLONOSCOPY N/A 01/10/2014   Procedure: COLONOSCOPY;  Surgeon: William Beams, MD;  Location: [facility];  Service: [facility];  Laterality: N/A;   ESOPHAGOGASTRODUODENOSCOPY N/A 01/10/2014   Procedure: ESOPHAGOGASTRODUODENOSCOPY (EGD);  Surgeon: William Beams, MD;  Location: [facility];  Service: Jones;  Laterality: N/A;   FLEXIBLE SIGMOIDOSCOPY N/A 09/19/2015   Procedure: FLEXIBLE SIGMOIDOSCOPY;  Surgeon:  William Ada, MD;  Location: [facility];  Service: Jones;  Laterality: N/A;   FLEXIBLE SIGMOIDOSCOPY N/A 06/03/2017   Procedure: FLEXIBLE SIGMOIDOSCOPY;  Surgeon: William Ada, MD;  Location: [facility];  Service: Jones;  Laterality: N/A;   FLEXIBLE SIGMOIDOSCOPY N/A 01/06/2018   Procedure: FLEXIBLE SIGMOIDOSCOPY;  Surgeon: William Ada, MD;  Location: [facility];  Service: Jones;  Laterality: N/A;   GIVENS CAPSULE STUDY N/A 01/10/2014   Procedure: GIVENS CAPSULE STUDY;  Surgeon: William Beams, MD;  Location: [facility];  Service: Jones;  Laterality: N/A;   HOT HEMOSTASIS N/A 09/19/2015   Procedure: HOT HEMOSTASIS (ARGON PLASMA COAGULATION/BICAP);  Surgeon: William Ada, MD;  Location: [facility];  Service: Jones;  Laterality: N/A;   HOT HEMOSTASIS N/A 06/03/2017   Procedure: HOT HEMOSTASIS (ARGON PLASMA COAGULATION/BICAP);  Surgeon: William Ada, MD;  Location: [facility];  Service: Jones;  Laterality: N/A;   PROSTATE BIOPSY  03/05/14   Gleason 7, vol 45 gm    FAMILY HISTORY:  Family History  Problem Relation Age of Onset   Kidney failure Brother 7       has been on HD since age 26    Stroke Maternal Uncle        22s-80s   Cancer Neg Hx     SOCIAL HISTORY:  Social History   Socioeconomic History   Marital status: Significant Other    Spouse name: [facility]   Number of children: 2   Years of education: Not on file   Highest education level: Not on file  Occupational History   Occupation: retired    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  Social Designer, fashion/clothing strain: Not on file   Food insecurity:    Worry: Not on file    Inability: Not on file   Transportation needs:    Medical: Not on file    Non-medical: Not on file  Tobacco Use   Smoking status: Former Smoker    Packs/day: 0.50    Years: 30.00    Pack years: 15.00    Types: Cigarettes    Last attempt to quit: 10/05/2007    Years since quitting: 11.2   Smokeless tobacco: Never Used  Substance and Sexual Activity   Alcohol use: Yes    Alcohol/week: 0.0 standard drinks    Comment: no alcohol for five years (per conversation 2020)   Drug use: No    Types: "Crack" cocaine, Marijuana    Comment: per Epic note 2009hx of crack cocaine use 12 years ago per pt, no current marijuana use per pt   Sexual activity: Yes    Birth  control/protection: Condom  Lifestyle   Physical activity:    Days per week: Not on file    Minutes per session: Not on file   Stress: Not on file  Relationships   Social connections:    Talks on phone: Not on file    Gets together: Not on file    Attends religious service: Not on file    Active member of club or organization: Not on file    Attends meetings of clubs or organizations: Not on file    Relationship status: Not on file   Intimate partner violence:    Fear of current or ex partner: Not on file    Emotionally abused: Not on file    Physically abused: Not on file    Forced sexual activity: Not on file  Other Topics Concern   Not on file  Social History  Narrative   On disability - frostbite. Used to work for [employer]. Retired in 1990. Has 2 kids. 6th grade. Resides with significant other.    ALLERGIES: Patient has no known allergies.  MEDICATIONS:  Current Outpatient Medications  Medication Sig Dispense Refill   brimonidine (ALPHAGAN) 0.2 % ophthalmic solution INT 1 GTT INTO THE RIGHT EYE BID  0   dorzolamide (TRUSOPT) 2 % ophthalmic solution INT 1 GTT IN OD BID     ferrous sulfate 325 (65 FE) MG tablet Take 1 tablet (325 mg total) by mouth daily with breakfast. 90 tablet 3   timolol (TIMOPTIC) 0.5 % ophthalmic solution PLACE ONE DROP 2 TIMES INTO THE RIGHT EYE UTD  7   BETIMOL 0.5 % ophthalmic solution INT 1 GTT IN OD BID  5   triamcinolone (KENALOG) 0.025 % ointment Apply 1 application topically 2 (two) times daily. Apply to affected area twice a day. (Patient not taking: Reported on 12/27/2018) 30 g 1   valACYclovir (VALTREX) 1000 MG tablet TK 1 T PO TID  0   No current facility-administered medications for this encounter.     REVIEW OF SYSTEMS:  On review of systems, the patient reports that he is doing well overall. He denies any chest pain, shortness of breath, cough, fevers, chills, night sweats, or unintended weight changes. He denies any bowel  disturbances, and denies abdominal pain, nausea or vomiting. He denies any focal bony pain or new musculoskeletal or joint aches or pains.  He feels that he is tolerating the ADT very well and denies any excessive fatigue, tiredness or hot flashes.  A complete review of systems is obtained and is otherwise negative.    PHYSICAL EXAM:  Wt Readings from Last 3 Encounters:  12/27/18 173 lb (78.5 kg)  12/01/18 177 lb 6.4 oz (80.5 kg)  08/21/18 177 lb 12.8 oz (80.6 kg)   Temp Readings from Last 3 Encounters:  12/01/18 98.1 F (36.7 C) (Oral)  08/21/18 97.8 F (36.6 C) (Oral)  02/12/18 98.3 F (36.8 C) (Oral)   BP Readings from Last 3 Encounters:  12/01/18 (!) 137/98  08/21/18 (!) 143/93  02/12/18 131/90   Pulse Readings from Last 3 Encounters:  12/01/18 80  08/21/18 67  02/12/18 68   Pain Assessment Pain Score: 0-No pain/10 Physical exam deferred due to teleconference consult format.  LABORATORY DATA:  Lab Results  Component Value Date   WBC 3.1 (L) 02/12/2018   HGB 13.5 02/12/2018   HCT 42.2 02/12/2018   MCV 86.7 02/12/2018   PLT 234 02/12/2018   Lab Results  Component Value Date   NA 140 02/12/2018   K 3.8 02/12/2018   CL 107 02/12/2018   CO2 26 02/12/2018   Lab Results  Component Value Date   ALT 20 02/12/2018   AST 20 02/12/2018   ALKPHOS 44 02/12/2018   BILITOT <0.1 (L) 02/12/2018     RADIOGRAPHY: No results found.    IMPRESSION/PLAN: 1. 68 y.o. gentleman with oligometastatic disease in the sacrum from Stage cT3a adenocarcinoma of the prostate. I spoke with the patient to conduct his consult visit via telephone to spare the patient unnecessary potential exposure in the healthcare setting during the current COVID-19 pandemic. The patient was notified in advance and was offered a [place] meeting to allow for face to face communication but unfortunately reported that he did not have the appropriate resources/technology to support such a visit and instead  preferred to proceed with a telephone consult. We talked  to the patient about the findings and workup thus far. We discussed the natural history of metastatic prostatic adenocarcinoma and general treatment, highlighting the role of stereotactic radiotherapy in the management oligometastatic disease. We discussed the available radiation techniques, and focused on the details of logistics and delivery. The recommendation is for a 3-5 fraction course of stereotactic radiation to the isolated right sacral metastasis.  We reviewed the anticipated acute and late sequelae associated with radiation in this setting. The patient was encouraged to ask questions that were answered to his stated satisfaction.   Given that the patient is currently asymptomatic and in an effort to limit patient exposure during the COVID-19 pandemic, our recommendation is to continue on ADT with plans to move forward with SBRT to the right sacral lesion in June or July of 2020.  He understands that the timing of starting his radiation treatments will depend on progress made in controlling the current COVID-19 pandemic which may affect our treatment recommendations as we move forward.  We will remain in close communication with the patient to coordinate his CT simulation prior to treatment in the near future. The patient appears to have a good understanding of his disease and our recommendations and is comfortable and in agreement with the stated plan.  We will share this information with Dr. Alen Jones and Dr. Lovena Jones and proceed with treatment planning accordingly.  Given current concerns for patient exposure during the COVID-19 pandemic, this encounter was conducted via telephone.  The patient has given verbal consent for this type of encounter. The time spent during this encounter was 60 minutes and 50% of that time was spent in the coordination of his care. The attendants for this meeting include Tyler Pita MD, Ashlyn Bruning PA-C, and  patient Daiden Coltrane. [place]. During the encounter, Tyler Pita MD and Freeman Caldron PA-C were located at [facility] Radiation Oncology Department.  Patient Oscar Hank. [place] was located at home.   Nicholos Johns, PA-C    Tyler Pita, MD  [facility] Direct Dial: [phone]   Fax: [phone] [email or web address]   Skype   [email or web address]   This document serves as a record of services personally performed by Tyler Pita, MD and Freeman Caldron, PA-C. It was created on their behalf by Wilburn Mylar, a trained medical scribe. The creation of this record is based on the scribe's personal observations and the provider's statements to them. This document has been checked and approved by the attending provider.

## 2019-01-01 ENCOUNTER — Telehealth: Payer: Self-pay | Admitting: Oncology

## 2019-01-01 NOTE — Telephone Encounter (Signed)
Faxed medical records to [facility] [phone], Release [id number]

## 2019-01-03 ENCOUNTER — Telehealth: Payer: Self-pay | Admitting: *Deleted

## 2019-01-03 NOTE — Telephone Encounter (Signed)
Called patient to inform of [facility] [place] appt. and sim appt. for 04-03-19, lvm for a return call

## 2019-01-15 DIAGNOSIS — H40051 Ocular hypertension, right eye: Secondary | ICD-10-CM | POA: Diagnosis not present

## 2019-01-15 DIAGNOSIS — H209 Unspecified iridocyclitis: Secondary | ICD-10-CM | POA: Diagnosis not present

## 2019-02-12 ENCOUNTER — Ambulatory Visit (INDEPENDENT_AMBULATORY_CARE_PROVIDER_SITE_OTHER): Payer: Medicare Other | Admitting: Podiatry

## 2019-02-12 ENCOUNTER — Other Ambulatory Visit: Payer: Self-pay

## 2019-02-12 ENCOUNTER — Ambulatory Visit: Payer: Medicare Other | Admitting: Podiatry

## 2019-02-12 VITALS — Temp 97.5°F

## 2019-02-12 DIAGNOSIS — M79676 Pain in unspecified toe(s): Secondary | ICD-10-CM | POA: Diagnosis not present

## 2019-02-12 DIAGNOSIS — E0842 Diabetes mellitus due to underlying condition with diabetic polyneuropathy: Secondary | ICD-10-CM

## 2019-02-12 DIAGNOSIS — L989 Disorder of the skin and subcutaneous tissue, unspecified: Secondary | ICD-10-CM

## 2019-02-12 DIAGNOSIS — B351 Tinea unguium: Secondary | ICD-10-CM

## 2019-02-13 ENCOUNTER — Other Ambulatory Visit (HOSPITAL_COMMUNITY): Payer: Self-pay | Admitting: Urology

## 2019-02-13 ENCOUNTER — Other Ambulatory Visit: Payer: Self-pay | Admitting: Urology

## 2019-02-13 DIAGNOSIS — R9721 Rising PSA following treatment for malignant neoplasm of prostate: Secondary | ICD-10-CM

## 2019-02-16 NOTE — Progress Notes (Signed)
    Subjective: Patient is a 68 y.o. male presenting to the office today for follow up evaluation of a painful callus lesions of the left foot. Wearing shoes and walking increases the pain. He has not done anything for treatment at home.   Patient also complains of elongated, thickened nails that cause pain while ambulating in shoes. He is unable to trim his own nails. Patient presents today for further treatment and evaluation.  Past Medical History:  Diagnosis Date  . Duodenitis   . Essential hypertension 07/16/2015  . Glaucoma   . H/O: substance abuse ([place])   . History of frostbite   . History of radiation therapy nov 2015 to feb 2016  . Iron deficiency anemia   . Peripheral neuropathy   . Prostate cancer ([facility]) 03/05/14   Gleason 7, volume 45 gm  . Rash and nonspecific skin eruption 08/04/2017  . S/P radiation therapy 09/18/14-11/15/14   prostate/ 7800Gy/40sessions  . Thrombocytopenia (HCC)     Objective:  Physical Exam General: Alert and oriented x3 in no acute distress  Dermatology: Hyperkeratotic lesions present on the left foot x 3. Pain on palpation with a central nucleated core noted. Skin is warm, dry and supple bilateral lower extremities. Negative for open lesions or macerations. Nails are tender, long, thickened and dystrophic with subungual debris, consistent with onychomycosis, 1-5 bilateral. No signs of infection noted.  Vascular: Palpable pedal pulses bilaterally. No edema or erythema noted. Capillary refill within normal limits.  Neurological: Epicritic and protective threshold grossly intact bilaterally.   Musculoskeletal Exam: Pain on palpation at the keratotic lesions noted. Range of motion within normal limits bilateral. Muscle strength 5/5 in all groups bilateral.  Assessment: 1. Onychodystrophic nails 1-5 bilateral with hyperkeratosis of nails.  2. Onychomycosis of nail due to dermatophyte bilateral 3. Pre-ulcerative callus lesions noted to the left foot   Plan of Care:  1. Patient evaluated. 2. Excisional debridement of keratoic lesions using a chisel blade was performed without incident.  3. Dressed with light dressing. 4. Mechanical debridement of nails 1-5 bilaterally performed using a nail nipper. Filed with dremel without incident.  5. Patient is to return to the clinic in 3 months.   Edrick Kins, DPM [facility]  Dr. Edrick Kins, [place]                                        [place], [place] [address]                Office [phone]  Fax [phone]

## 2019-03-06 DIAGNOSIS — C7951 Secondary malignant neoplasm of bone: Secondary | ICD-10-CM | POA: Diagnosis not present

## 2019-03-25 ENCOUNTER — Encounter: Payer: Self-pay | Admitting: *Deleted

## 2019-04-02 ENCOUNTER — Ambulatory Visit: Payer: Medicare Other | Admitting: Radiation Oncology

## 2019-04-02 NOTE — Progress Notes (Signed)
  Radiation Oncology         [phone] ________________________________  Name: VANDER KUEKER MRN: [id number]  Date: 04/03/2019  DOB: [birth year]  STEREOTACTIC BODY RADIOTHERAPY SIMULATION AND TREATMENT PLANNING NOTE    ICD-10-CM   1. Prostate cancer metastatic to bone [facility])  C61    C79.51     DIAGNOSIS:  68 y.o. gentleman with oligometastatic disease in the sacrum for stage IV oligometastatic adenocarcinoma of the prostate.  NARRATIVE:  The patient was brought to the [facility].  Identity was confirmed.  All relevant records and images related to the planned course of therapy were reviewed.  The patient freely provided informed written consent to proceed with treatment after reviewing the details related to the planned course of therapy. The consent form was witnessed and verified by the simulation staff.  Then, the patient was set-up in a stable reproducible  supine position for radiation therapy.  A BodyFix immobilization pillow was fabricated for reproducible positioning.  Surface markings were placed.  The CT images were loaded into the planning software.  The gross target volumes (GTV) and planning target volumes (PTV) were delinieated, and avoidance structures were contoured.  Treatment planning then occurred.  The radiation prescription was entered and confirmed.  A total of two complex treatment devices were fabricated in the form of the BodyFix immobilization pillow and a neck accuform cushion.  I have requested : 3D Simulation  I have requested a DVH of the following structures: targets and all normal structures near the target including rectum, bowel, bladder and skin as noted on the radiation plan to maintain doses in adherence with established limits  PLAN:  The patient will receive 40 Gy in 5 fraction.  ________________________________  Sheral Apley Tammi Klippel, M.D.

## 2019-04-03 ENCOUNTER — Inpatient Hospital Stay
Admission: RE | Admit: 2019-04-03 | Discharge: 2019-04-03 | Disposition: A | Discharge status: Home / Self Care | Payer: Self-pay | Source: Ambulatory Visit | Attending: Urology | Admitting: Urology

## 2019-04-03 ENCOUNTER — Other Ambulatory Visit: Payer: Self-pay

## 2019-04-03 ENCOUNTER — Ambulatory Visit
Admission: RE | Admit: 2019-04-03 | Discharge: 2019-04-03 | Disposition: A | Discharge status: Home / Self Care | Payer: Medicare Other | Source: Ambulatory Visit | Attending: Radiation Oncology | Admitting: Radiation Oncology

## 2019-04-03 ENCOUNTER — Encounter: Payer: Self-pay | Admitting: Urology

## 2019-04-03 VITALS — BP 146/99 | HR 71 | Temp 97.8°F | Resp 20 | Wt 189.0 lb

## 2019-04-03 DIAGNOSIS — C7951 Secondary malignant neoplasm of bone: Secondary | ICD-10-CM | POA: Insufficient documentation

## 2019-04-03 DIAGNOSIS — Z510 Encounter for antineoplastic radiation therapy: Secondary | ICD-10-CM | POA: Insufficient documentation

## 2019-04-03 DIAGNOSIS — C61 Malignant neoplasm of prostate: Secondary | ICD-10-CM

## 2019-04-03 DIAGNOSIS — C7952 Secondary malignant neoplasm of bone marrow: Secondary | ICD-10-CM

## 2019-04-03 DIAGNOSIS — Z923 Personal history of irradiation: Secondary | ICD-10-CM | POA: Diagnosis not present

## 2019-04-03 DIAGNOSIS — Z9289 Personal history of other medical treatment: Secondary | ICD-10-CM | POA: Diagnosis not present

## 2019-04-03 NOTE — Progress Notes (Signed)
Radiation Oncology         [phone]) [phone] ________________________________  Follow up New Visit  Name: William Jones MRN: [id number]  Date: 04/03/2019  DOB: Dec 18, 1950  XL:KGMWN, [name], MD  Wyatt Portela, MD   REFERRING PHYSICIAN: Wyatt Portela, MD  DIAGNOSIS: 68 y.o. gentleman with oligometastatic disease in the sacrum from Stage cT3a adenocarcinoma of the prostate.    ICD-10-CM   1. Secondary malignant neoplasm of bone and bone marrow (HCC)  C79.51    C79.52   2. Prostate cancer metastatic to bone [facility])  C61    C79.51     HISTORY OF PRESENT ILLNESS: William Jones is a 68 y.o. male with a history of prostate cancer diagnosed in June 2015, with high volume, Gleason 4+3 and PSA of 42.9. He was treated with definitive radiation with Dr. Valere Dross, completed in 11/2014, in combination with LT-ADT, completed in 2018.  His PSA nadir was undetectable in 2017 but began to rise shortly after completion of ADT, with a PSA up to 0.23 in 03/23/2018 and further elevated to 0.86 in 10/2018. His care was transitioned over to Dr. Lovena Neighbours in late 2018 he has continued in close follow up since that time.  Disease restaging with CT A/P on 10/30/2018, showed a new right sacral sclerosis suspicious for malignancy. Bone scan also performed on 10/30/2018 showed a focal area of uptake in the right side of the sacrum corresponding with the area of sclerosis seen on the recent CT and suspicious for solitary bone metastasis but no other areas of concern.  MRI Pelvis on 11/13/2018 confirmed a new sclerotic lesion in the superior aspect of the right sacrum consistent with solitary metastasis.  No other suspicious osseous findings were demonstrated.  He was restarted on ADT with Lupron at [facility] on 11/21/2018 and referral was made to medical oncology for discussion of the potential for addition of systemic chemotherapy. He met with Dr. Alen Blew on 12/01/2018, whose recommendation was to reserve additional  systemic therapy at this time given his very low volume disease and consider in the future if his PSA continues to rise or other evidence of disease progression despite ADT.    When we first met him in March, he wished to postpone the start of his radiation treatment due to COVID-19 but is now ready to proceed and therefore presents today for further discussion and treatment planning.  PREVIOUS RADIATION THERAPY: Yes  09/18/2014 - 11/15/2014 (Dr. Valere Dross):  1. Prostate / 78 Gy in 40 fractions 2. Seminal vesicles and pelvic lymph nodes / 56 Gy in 40 fractions  PAST MEDICAL HISTORY:  Past Medical History:  Diagnosis Date  . Duodenitis   . Essential hypertension 07/16/2015  . Glaucoma   . H/O: substance abuse ([place])   . History of frostbite   . History of radiation therapy nov 2015 to feb 2016  . Iron deficiency anemia   . Peripheral neuropathy   . Prostate cancer ([place]) 03/05/14   Gleason 7, volume 45 gm  . Rash and nonspecific skin eruption 08/04/2017  . S/P radiation therapy 09/18/14-11/15/14   prostate/ 7800Gy/40sessions  . Thrombocytopenia ([place])       PAST SURGICAL HISTORY: Past Surgical History:  Procedure Laterality Date  . COLONOSCOPY N/A 01/10/2014   Procedure: COLONOSCOPY;  Surgeon: [name], MD;  Location: [place];  Service: Endoscopy;  Laterality: N/A;  . ESOPHAGOGASTRODUODENOSCOPY N/A 01/10/2014   Procedure: ESOPHAGOGASTRODUODENOSCOPY (EGD);  Surgeon: [name], MD;  Location: [place];  Service: Endoscopy;  Laterality: N/A;  . FLEXIBLE SIGMOIDOSCOPY N/A 09/19/2015   Procedure: FLEXIBLE SIGMOIDOSCOPY;  Surgeon: Carol Ada, MD;  Location: [facility];  Service: Endoscopy;  Laterality: N/A;  . FLEXIBLE SIGMOIDOSCOPY N/A 06/03/2017   Procedure: FLEXIBLE SIGMOIDOSCOPY;  Surgeon: Carol Ada, MD;  Location: [facility];  Service: Endoscopy;  Laterality: N/A;  . FLEXIBLE SIGMOIDOSCOPY N/A 01/06/2018   Procedure: FLEXIBLE SIGMOIDOSCOPY;  Surgeon: Carol Ada,  MD;  Location: [facility];  Service: Endoscopy;  Laterality: N/A;  . GIVENS CAPSULE STUDY N/A 01/10/2014   Procedure: GIVENS CAPSULE STUDY;  Surgeon: [name], MD;  Location: [place];  Service: Endoscopy;  Laterality: N/A;  . HOT HEMOSTASIS N/A 09/19/2015   Procedure: HOT HEMOSTASIS (ARGON PLASMA COAGULATION/BICAP);  Surgeon: Carol Ada, MD;  Location: [facility];  Service: Endoscopy;  Laterality: N/A;  . HOT HEMOSTASIS N/A 06/03/2017   Procedure: HOT HEMOSTASIS (ARGON PLASMA COAGULATION/BICAP);  Surgeon: Carol Ada, MD;  Location: [facility];  Service: Endoscopy;  Laterality: N/A;  . PROSTATE BIOPSY  03/05/14   Gleason 7, vol 45 gm    FAMILY HISTORY:  Family History  Problem Relation Age of Onset  . Kidney failure Brother 5       has been on HD since age 45   . Stroke Maternal Uncle        70s-80s  . Cancer Neg Hx     SOCIAL HISTORY:  Social History   Socioeconomic History  . Marital status: Significant Other    Spouse name: Joyce Copa  . Number of children: 2  . Years of education: Not on file  . Highest education level: Not on file  Occupational History  . Occupation: retired    Fish farm manager: UNEMPLOYED  Social Needs  . Financial resource strain: Not on file  . Food insecurity    Worry: Not on file    Inability: Not on file  . Transportation needs    Medical: Not on file    Non-medical: Not on file  Tobacco Use  . Smoking status: Former Smoker    Packs/day: 0.50    Years: 30.00    Pack years: 15.00    Types: Cigarettes    Quit date: 10/05/2007    Years since quitting: 11.5  . Smokeless tobacco: Never Used  Substance and Sexual Activity  . Alcohol use: Yes    Alcohol/week: 0.0 standard drinks    Comment: no alcohol for five years (per conversation 2020)  . Drug use: No    Types: "Crack" cocaine, Marijuana    Comment: per Epic note 2009hx of crack cocaine use 12 years ago per pt, no current marijuana use per pt  . Sexual activity: Yes     Birth control/protection: Condom  Lifestyle  . Physical activity    Days per week: Not on file    Minutes per session: Not on file  . Stress: Not on file  Relationships  . Social Herbalist on phone: Not on file    Gets together: Not on file    Attends religious service: Not on file    Active member of club or organization: Not on file    Attends meetings of clubs or organizations: Not on file    Relationship status: Not on file  . Intimate partner violence    Fear of current or ex partner: Not on file    Emotionally abused: Not on file    Physically abused: Not on file    Forced sexual  activity: Not on file  Other Topics Concern  . Not on file  Social History Narrative   On disability - frostbite. Used to work for [employer]. Retired in 1990. Has 2 kids. 6th grade. Resides with significant other.    ALLERGIES: Patient has no known allergies.  MEDICATIONS:  Current Outpatient Medications  Medication Sig Dispense Refill  . BETIMOL 0.5 % ophthalmic solution INT 1 GTT IN OD BID  5  . brimonidine (ALPHAGAN) 0.2 % ophthalmic solution INT 1 GTT INTO THE RIGHT EYE BID  0  . dorzolamide (TRUSOPT) 2 % ophthalmic solution INT 1 GTT IN OD BID    . ferrous sulfate 325 (65 FE) MG tablet Take 1 tablet (325 mg total) by mouth daily with breakfast. 90 tablet 3  . timolol (TIMOPTIC) 0.5 % ophthalmic solution PLACE ONE DROP 2 TIMES INTO THE RIGHT EYE UTD  7  . brimonidine (ALPHAGAN) 0.2 % ophthalmic solution     . triamcinolone (KENALOG) 0.025 % ointment Apply 1 application topically 2 (two) times daily. Apply to affected area twice a day. (Patient not taking: Reported on 04/03/2019) 30 g 1  . valACYclovir (VALTREX) 1000 MG tablet TK 1 T PO TID  0   No current facility-administered medications for this encounter.     REVIEW OF SYSTEMS:  On review of systems, the patient reports that he is doing well overall. He denies any chest pain, shortness of breath, cough, fevers, chills,  night sweats, or unintended weight changes. He denies any bowel disturbances, and denies abdominal pain, nausea or vomiting. He denies any focal bony pain or new musculoskeletal or joint aches or pains.  He feels that he is tolerating the ADT very well and denies any excessive fatigue, tiredness or hot flashes.  A complete review of systems is obtained and is otherwise negative.    PHYSICAL EXAM:  Wt Readings from Last 3 Encounters:  04/03/19 189 lb (85.7 kg)  12/27/18 173 lb (78.5 kg)  12/01/18 177 lb 6.4 oz (80.5 kg)   Temp Readings from Last 3 Encounters:  04/03/19 97.8 F (36.6 C) (Oral)  02/12/19 (!) 97.5 F (36.4 C)  12/01/18 98.1 F (36.7 C) (Oral)   BP Readings from Last 3 Encounters:  04/03/19 (!) 146/99  12/01/18 (!) 137/98  08/21/18 (!) 143/93   Pulse Readings from Last 3 Encounters:  04/03/19 71  12/01/18 80  08/21/18 67   Pain Assessment Pain Score: 0-No pain/10 In general this is a well appearing African-American male in no acute distress.  He's alert and oriented x4 and appropriate throughout the examination. Cardiopulmonary assessment is negative for acute distress and he exhibits normal effort.    LABORATORY DATA:  Lab Results  Component Value Date   WBC 3.1 (L) 02/12/2018   HGB 13.5 02/12/2018   HCT 42.2 02/12/2018   MCV 86.7 02/12/2018   PLT 234 02/12/2018   Lab Results  Component Value Date   NA 140 02/12/2018   K 3.8 02/12/2018   CL 107 02/12/2018   CO2 26 02/12/2018   Lab Results  Component Value Date   ALT 20 02/12/2018   AST 20 02/12/2018   ALKPHOS 44 02/12/2018   BILITOT <0.1 (L) 02/12/2018     RADIOGRAPHY: No results found.    IMPRESSION/PLAN: 1. 68 y.o. gentleman with oligometastatic disease in the sacrum from Stage cT3a adenocarcinoma of the prostate. Today, we reviewed the findings and workup thus far. We discussed the natural history of metastatic prostatic adenocarcinoma and  general treatment, highlighting the role of  stereotactic radiotherapy in the management oligometastatic disease. We discussed the available radiation techniques, and focused on the details of logistics and delivery. The recommendation is for a 3-5 fraction course of stereotactic radiation to the isolated right sacral metastasis.  We reviewed the anticipated acute and late sequelae associated with radiation in this setting. The patient was encouraged to ask questions that were answered to his stated satisfaction.   At the conclusion of our conversation, the patient elects to move forward with stereotactic radiotherapy to the oligometastatic disease in the right sacrum as recommended.  He has freely signed written consent to proceed today in the office and a copy of this document has been placed in his chart.  He will proceed with CT simulation/treatment planning following our visit today in anticipation of beginning treatment in the very near future.  The patient appears to have a good understanding of his disease and our recommendations and is comfortable and in agreement with the stated plan.  We will share this information with Dr. Alen Blew and Dr. Lovena Neighbours and proceed with treatment planning accordingly.    Nicholos Johns, PA-C    Tyler Pita, MD  [facility] Direct Dial: [phone]  Fax: [phone] [email or web address]  Skype  LinkedIn

## 2019-04-05 DIAGNOSIS — C61 Malignant neoplasm of prostate: Secondary | ICD-10-CM | POA: Insufficient documentation

## 2019-04-05 DIAGNOSIS — C7951 Secondary malignant neoplasm of bone: Secondary | ICD-10-CM | POA: Insufficient documentation

## 2019-04-05 DIAGNOSIS — Z510 Encounter for antineoplastic radiation therapy: Secondary | ICD-10-CM | POA: Insufficient documentation

## 2019-04-10 ENCOUNTER — Encounter: Payer: Self-pay | Admitting: Internal Medicine

## 2019-04-10 ENCOUNTER — Other Ambulatory Visit: Payer: Self-pay

## 2019-04-10 ENCOUNTER — Ambulatory Visit (INDEPENDENT_AMBULATORY_CARE_PROVIDER_SITE_OTHER): Payer: Medicare Other | Admitting: Internal Medicine

## 2019-04-10 VITALS — BP 146/98 | HR 73 | Temp 98.0°F | Ht 69.0 in | Wt 190.5 lb

## 2019-04-10 DIAGNOSIS — Z0000 Encounter for general adult medical examination without abnormal findings: Secondary | ICD-10-CM | POA: Diagnosis not present

## 2019-04-10 DIAGNOSIS — G6289 Other specified polyneuropathies: Secondary | ICD-10-CM

## 2019-04-10 DIAGNOSIS — D72819 Decreased white blood cell count, unspecified: Secondary | ICD-10-CM

## 2019-04-10 DIAGNOSIS — C7951 Secondary malignant neoplasm of bone: Secondary | ICD-10-CM | POA: Diagnosis not present

## 2019-04-10 DIAGNOSIS — C61 Malignant neoplasm of prostate: Secondary | ICD-10-CM

## 2019-04-10 DIAGNOSIS — Z923 Personal history of irradiation: Secondary | ICD-10-CM

## 2019-04-10 DIAGNOSIS — H409 Unspecified glaucoma: Secondary | ICD-10-CM

## 2019-04-10 DIAGNOSIS — D509 Iron deficiency anemia, unspecified: Secondary | ICD-10-CM

## 2019-04-10 DIAGNOSIS — Z79899 Other long term (current) drug therapy: Secondary | ICD-10-CM

## 2019-04-10 DIAGNOSIS — I10 Essential (primary) hypertension: Secondary | ICD-10-CM

## 2019-04-10 LAB — POCT GLYCOSYLATED HEMOGLOBIN (HGB A1C): Hemoglobin A1C: 5.2 % (ref 4.0–5.6)

## 2019-04-10 LAB — GLUCOSE, CAPILLARY: Glucose-Capillary: 113 mg/dL — ABNORMAL HIGH (ref 70–99)

## 2019-04-10 NOTE — Assessment & Plan Note (Signed)
Patient continues to use eye drops as prescribed by Dr. Alanda Slim at [facility]. At this time, patient denies any headaches or changes in vision.  - Continue eye drops

## 2019-04-10 NOTE — Assessment & Plan Note (Signed)
Patient continues to take iron supplement. Last CBC showed Hb 13.5 in 02/2018.   - Check CBC - Continue iron supplementation

## 2019-04-10 NOTE — Patient Instructions (Addendum)
William Jones,  It was a pleasure seeing you in clinic today. We discussed your hypertension today and agreed to continue making lifestyle modifications that will help you with the blood pressure and recent weight gain.  - Continue to follow a low-sodium diet - Decrease soft drink intake - Increase daily exercise to 30 minutes/day  - Follow up in 1 month  - Contact us with any concerns   Managing Your Hypertension Hypertension is commonly called high blood pressure. This is when the force of your blood pressing against the walls of your arteries is too strong. Arteries are blood vessels that carry blood from your heart throughout your body. Hypertension forces the heart to work harder to pump blood, and may cause the arteries to become narrow or stiff. Having untreated or uncontrolled hypertension can cause heart attack, stroke, kidney disease, and other problems. What are blood pressure readings? A blood pressure reading consists of a higher number over a lower number. Ideally, your blood pressure should be below 120/80. The first ("top") number is called the systolic pressure. It is a measure of the pressure in your arteries as your heart beats. The second ("bottom") number is called the diastolic pressure. It is a measure of the pressure in your arteries as the heart relaxes. What does my blood pressure reading mean? Blood pressure is classified into four stages. Based on your blood pressure reading, your health care provider may use the following stages to determine what type of treatment you need, if any. Systolic pressure and diastolic pressure are measured in a unit called mm Hg. Normal  Systolic pressure: below 010.  Diastolic pressure: below 80. Elevated  Systolic pressure: 272-536.  Diastolic pressure: below 80. Hypertension stage 1  Systolic pressure: 644-034.  Diastolic pressure: 74-25. Hypertension stage 2  Systolic pressure: 956 or above.  Diastolic pressure: 90 or  above. What health risks are associated with hypertension? Managing your hypertension is an important responsibility. Uncontrolled hypertension can lead to:  A heart attack.  A stroke.  A weakened blood vessel (aneurysm).  Heart failure.  Kidney damage.  Eye damage.  Metabolic syndrome.  Memory and concentration problems. What changes can I make to manage my hypertension? Hypertension can be managed by making lifestyle changes and possibly by taking medicines. Your health care provider will help you make a plan to bring your blood pressure within a normal range. Eating and drinking   Eat a diet that is high in fiber and potassium, and low in salt (sodium), added sugar, and fat. An example eating plan is called the DASH (Dietary Approaches to Stop Hypertension) diet. To eat this way: ? Eat plenty of fresh fruits and vegetables. Try to fill half of your plate at each meal with fruits and vegetables. ? Eat whole grains, such as whole wheat pasta, brown rice, or whole grain bread. Fill about one quarter of your plate with whole grains. ? Eat low-fat diary products. ? Avoid fatty cuts of meat, processed or cured meats, and poultry with skin. Fill about one quarter of your plate with lean proteins such as fish, chicken without skin, beans, eggs, and tofu. ? Avoid premade and processed foods. These tend to be higher in sodium, added sugar, and fat.  Reduce your daily sodium intake. Most people with hypertension should eat less than 1,500 mg of sodium a day.  Limit alcohol intake to no more than 1 drink a day for nonpregnant women and 2 drinks a day for men. One drink equals  12 oz of beer, 5 oz of wine, or 1 oz of hard liquor. Lifestyle  Work with your health care provider to maintain a healthy body weight, or to lose weight. Ask what an ideal weight is for you.  Get at least 30 minutes of exercise that causes your heart to beat faster (aerobic exercise) most days of the week.  Activities may include walking, swimming, or biking.  Include exercise to strengthen your muscles (resistance exercise), such as weight lifting, as part of your weekly exercise routine. Try to do these types of exercises for 30 minutes at least 3 days a week.  Do not use any products that contain nicotine or tobacco, such as cigarettes and e-cigarettes. If you need help quitting, ask your health care provider.  Control any long-term (chronic) conditions you have, such as high cholesterol or diabetes. Monitoring  Monitor your blood pressure at home as told by your health care provider. Your personal target blood pressure may vary depending on your medical conditions, your age, and other factors.  Have your blood pressure checked regularly, as often as told by your health care provider. Working with your health care provider  Review all the medicines you take with your health care provider because there may be side effects or interactions.  Talk with your health care provider about your diet, exercise habits, and other lifestyle factors that may be contributing to hypertension.  Visit your health care provider regularly. Your health care provider can help you create and adjust your plan for managing hypertension. Will I need medicine to control my blood pressure? Your health care provider may prescribe medicine if lifestyle changes are not enough to get your blood pressure under control, and if:  Your systolic blood pressure is 130 or higher.  Your diastolic blood pressure is 80 or higher. Take medicines only as told by your health care provider. Follow the directions carefully. Blood pressure medicines must be taken as prescribed. The medicine does not work as well when you skip doses. Skipping doses also puts you at risk for problems. Contact a health care provider if:  You think you are having a reaction to medicines you have taken.  You have repeated (recurrent) headaches.  You feel  dizzy.  You have swelling in your ankles.  You have trouble with your vision. Get help right away if:  You develop a severe headache or confusion.  You have unusual weakness or numbness, or you feel faint.  You have severe pain in your chest or abdomen.  You vomit repeatedly.  You have trouble breathing. Summary  Hypertension is when the force of blood pumping through your arteries is too strong. If this condition is not controlled, it may put you at risk for serious complications.  Your personal target blood pressure may vary depending on your medical conditions, your age, and other factors. For most people, a normal blood pressure is less than 120/80.  Hypertension is managed by lifestyle changes, medicines, or both. Lifestyle changes include weight loss, eating a healthy, low-sodium diet, exercising more, and limiting alcohol. This information is not intended to replace advice given to you by your health care provider. Make sure you discuss any questions you have with your health care provider. Document Released: 06/14/2012 Document Revised: 01/12/2019 Document Reviewed: 08/18/2016 Elsevier Patient Education  2020 [employer].

## 2019-04-10 NOTE — Assessment & Plan Note (Signed)
CBC in 02/2018 showed WBC 3.1  - Repeat CBC

## 2019-04-10 NOTE — Progress Notes (Addendum)
   CC: follow up for prostate cancer   HPI:  William Jones is a 68 y.o. male with PMHx of oligometastatic prostate cancer and HTN presenting to the internal medicine clinic for routine follow up. Patient has a history of prostate cancer s/p radiation in 2015 and has been following closely with Dr. Lovena Neighbours. In January 2020, new sclerotic lesion in the superior aspect of the right sacrum was discovered on CT and confirmed with MRI Pelvis that was consistent with solitary metastasis. Patient was started on Lupron in February 2020 and is scheduled to start radiation therapy next week. Of note, patient reports a 13lb weight gain since starting the Lupron with some increased work of breathing.  Remainder of ROS negative.   Past Medical History:  Diagnosis Date  . Duodenitis   . Essential hypertension 07/16/2015  . Glaucoma   . H/O: substance abuse ([place])   . History of frostbite   . History of radiation therapy nov 2015 to feb 2016  . Iron deficiency anemia   . Peripheral neuropathy   . Prostate cancer ([place]) 03/05/14   Gleason 7, volume 45 gm  . Rash and nonspecific skin eruption 08/04/2017  . S/P radiation therapy 09/18/14-11/15/14   prostate/ 7800Gy/40sessions  . Thrombocytopenia ([place])    Review of Systems:  Review of Systems  Constitutional: Negative for chills, fever and malaise/fatigue.       Weight gain of 13lbs since March 2020  HENT: Negative for sore throat.   Eyes: Negative for blurred vision and double vision.  Respiratory: Negative for cough and shortness of breath.   Cardiovascular: Negative for chest pain, palpitations and leg swelling.  Gastrointestinal: Negative for abdominal pain, constipation, diarrhea, melena, nausea and vomiting.  Genitourinary: Negative for dysuria, flank pain, frequency, hematuria and urgency.  Musculoskeletal: Negative for joint pain and myalgias.  Neurological: Negative for dizziness, tingling, sensory change, weakness and headaches.   Psychiatric/Behavioral: Negative for depression.     Physical Exam: Physical Exam  Constitutional: He is oriented to person, place, and time and well-developed, well-nourished, and in no distress.  HENT:  Head: Normocephalic and atraumatic.  Eyes: Conjunctivae and EOM are normal.  Neck: Normal range of motion. Neck supple.  Cardiovascular: Normal rate, regular rhythm, normal heart sounds and intact distal pulses. Exam reveals no gallop and no friction rub.  No murmur heard. Pulmonary/Chest: Effort normal and breath sounds normal. No respiratory distress. He has no wheezes. He has no rales.  Abdominal: Soft. Bowel sounds are normal. He exhibits no distension. There is no abdominal tenderness.  Musculoskeletal: Normal range of motion.        General: No tenderness, deformity or edema.  Neurological: He is alert and oriented to person, place, and time. No cranial nerve deficit.  Skin: Skin is warm and dry.  Psychiatric: Mood, memory, affect and judgment normal.     Vitals:   04/10/19 0852  BP: (!) 146/98  Pulse: 73  Temp: 98 F (36.7 C)  TempSrc: Oral  SpO2: 100%  Weight: 190 lb 8 oz (86.4 kg)  Height: 5\' 9"  (1.753 m)     Assessment & Plan:   See Encounters Tab for problem based charting.  Patient seen with Dr. Angelia Mould

## 2019-04-10 NOTE — Assessment & Plan Note (Addendum)
Patient was found to have a metastatic lesion to the superior aspect of right sacrum in January 2020. He was started on Androgen Deprivation Therapy (Lupron) in February 2020 by [facility]. Patient is scheduled to start stereotactic radiation therapy next week and will receive 5 rounds of radiation.  As per Dr. Alen Blew, oncologist, systemic chemotherapy will be reserved at this time given the low volume disease and will be reconsidered if there is disease progression despite ADT.  At this time, patient is in good spirits and denies any weakness, malaise/fatigue, bone pain, muscle pain, recent illness or sick contacts. Of note, patient reports a 13lb weight gain since starting ADT with associated increased work of breathing. He is following closely with the radiation oncologist.   - Continue to follow up with radiation oncologist and monitor symptoms

## 2019-04-10 NOTE — Assessment & Plan Note (Signed)
BP today 146/98. The patient has been trying lifestyle modifications with a low-sodium diet. Patient was advised to restart medications. However, at this time, patient would like to continue to modify diet through elimination of soft drinks and increasing exercise.   - Continue lifestyle modifications - BP check in 1 month

## 2019-04-11 LAB — CMP14 + ANION GAP
ALT: 25 IU/L (ref 0–44)
AST: 19 IU/L (ref 0–40)
Albumin/Globulin Ratio: 1.6 (ref 1.2–2.2)
Albumin: 3.9 g/dL (ref 3.8–4.8)
Alkaline Phosphatase: 39 IU/L (ref 39–117)
Anion Gap: 15 mmol/L (ref 10.0–18.0)
BUN/Creatinine Ratio: 15 (ref 10–24)
BUN: 12 mg/dL (ref 8–27)
Bilirubin Total: <0.2 mg/dL (ref 0.0–1.2)
CO2: 19 mmol/L — ABNORMAL LOW (ref 20–29)
Calcium: 8.3 mg/dL — ABNORMAL LOW (ref 8.6–10.2)
Chloride: 107 mmol/L — ABNORMAL HIGH (ref 96–106)
Creatinine, Ser: 0.78 mg/dL (ref 0.76–1.27)
GFR calc Af Amer: 109 mL/min/{1.73_m2} (ref >59)
GFR calc non Af Amer: 94 mL/min/{1.73_m2} (ref >59)
Globulin, Total: 2.4 g/dL (ref 1.5–4.5)
Glucose: 108 mg/dL — ABNORMAL HIGH (ref 65–99)
Potassium: 5 mmol/L (ref 3.5–5.2)
Sodium: 141 mmol/L (ref 134–144)
Total Protein: 6.3 g/dL (ref 6.0–8.5)

## 2019-04-11 LAB — CBC WITH DIFFERENTIAL/PLATELET
Basophils Absolute: 0 10*3/uL (ref 0.0–0.2)
Basos: 1 %
EOS (ABSOLUTE): 0.2 10*3/uL (ref 0.0–0.4)
Eos: 5 %
Hematocrit: 32.8 % — ABNORMAL LOW (ref 37.5–51.0)
Hemoglobin: 10.2 g/dL — ABNORMAL LOW (ref 13.0–17.7)
Immature Grans (Abs): 0 10*3/uL (ref 0.0–0.1)
Immature Granulocytes: 0 %
Lymphocytes Absolute: 1.3 10*3/uL (ref 0.7–3.1)
Lymphs: 38 %
MCH: 27.3 pg (ref 26.6–33.0)
MCHC: 31.1 g/dL — ABNORMAL LOW (ref 31.5–35.7)
MCV: 88 fL (ref 79–97)
Monocytes Absolute: 0.5 10*3/uL (ref 0.1–0.9)
Monocytes: 14 %
Neutrophils Absolute: 1.4 10*3/uL (ref 1.4–7.0)
Neutrophils: 42 %
Platelets: 309 10*3/uL (ref 150–450)
RBC: 3.74 x10E6/uL — ABNORMAL LOW (ref 4.14–5.80)
RDW: 12.8 % (ref 11.6–15.4)
WBC: 3.4 10*3/uL (ref 3.4–10.8)

## 2019-04-16 ENCOUNTER — Ambulatory Visit: Payer: Medicare Other

## 2019-04-17 NOTE — Progress Notes (Signed)
Internal Medicine Clinic Attending  I saw and evaluated the patient.  I personally confirmed the key portions of the history and exam documented by Dr. Marva Panda and I reviewed pertinent patient test results.  The assessment, diagnosis, and plan were formulated together and I agree with the documentation in the resident's note.     CC is follow up for prostate cancer

## 2019-04-18 ENCOUNTER — Ambulatory Visit: Payer: Medicare Other

## 2019-04-20 ENCOUNTER — Ambulatory Visit: Payer: Medicare Other

## 2019-04-23 ENCOUNTER — Ambulatory Visit: Payer: Medicare Other

## 2019-04-24 ENCOUNTER — Ambulatory Visit: Payer: Medicare Other

## 2019-04-25 ENCOUNTER — Ambulatory Visit: Payer: Medicare Other

## 2019-04-26 ENCOUNTER — Ambulatory Visit: Payer: Medicare Other

## 2019-04-27 ENCOUNTER — Ambulatory Visit: Payer: Medicare Other

## 2019-04-30 ENCOUNTER — Ambulatory Visit: Payer: Medicare Other

## 2019-05-01 ENCOUNTER — Ambulatory Visit: Payer: Medicare Other

## 2019-05-02 ENCOUNTER — Ambulatory Visit: Payer: Medicare Other

## 2019-05-04 ENCOUNTER — Ambulatory Visit: Payer: Medicare Other

## 2019-05-08 ENCOUNTER — Ambulatory Visit: Payer: Medicare Other

## 2019-05-08 ENCOUNTER — Other Ambulatory Visit: Payer: Self-pay

## 2019-05-08 ENCOUNTER — Encounter: Payer: Self-pay | Admitting: Internal Medicine

## 2019-05-08 ENCOUNTER — Ambulatory Visit (INDEPENDENT_AMBULATORY_CARE_PROVIDER_SITE_OTHER): Payer: Medicare Other | Admitting: Internal Medicine

## 2019-05-08 DIAGNOSIS — Z79899 Other long term (current) drug therapy: Secondary | ICD-10-CM

## 2019-05-08 DIAGNOSIS — C61 Malignant neoplasm of prostate: Secondary | ICD-10-CM | POA: Diagnosis not present

## 2019-05-08 DIAGNOSIS — I10 Essential (primary) hypertension: Secondary | ICD-10-CM

## 2019-05-08 DIAGNOSIS — C7951 Secondary malignant neoplasm of bone: Secondary | ICD-10-CM

## 2019-05-08 DIAGNOSIS — D72819 Decreased white blood cell count, unspecified: Secondary | ICD-10-CM

## 2019-05-08 DIAGNOSIS — D509 Iron deficiency anemia, unspecified: Secondary | ICD-10-CM

## 2019-05-08 DIAGNOSIS — N529 Male erectile dysfunction, unspecified: Secondary | ICD-10-CM

## 2019-05-08 NOTE — Progress Notes (Signed)
   CC: hypertension f/u  HPI:  William Jones is a 68 y.o. male w/PMHx as listed below presenting to clinic for follow up for his hypertension. Patient has no acute concerns at this visit. Please see problem based charting for assessment and plan.   Past Medical History:  Diagnosis Date  . Duodenitis   . Essential hypertension 07/16/2015  . Glaucoma   . H/O: substance abuse ([place])   . History of frostbite   . History of radiation therapy nov 2015 to feb 2016  . Iron deficiency anemia   . Peripheral neuropathy   . Prostate cancer ([place]) 03/05/14   Gleason 7, volume 45 gm  . Rash and nonspecific skin eruption 08/04/2017  . S/P radiation therapy 09/18/14-11/15/14   prostate/ 7800Gy/40sessions  . Thrombocytopenia ([place])    Review of Systems:  Review of Systems  Constitutional: Negative for chills, fever, malaise/fatigue and weight loss.  Eyes: Negative for blurred vision and double vision.  Respiratory: Negative for cough, shortness of breath and wheezing.   Cardiovascular: Negative for chest pain and palpitations.  Gastrointestinal: Negative for abdominal pain, constipation, diarrhea, melena, nausea and vomiting.  Musculoskeletal: Negative for back pain, joint pain and myalgias.  Neurological: Negative for dizziness, sensory change, focal weakness and headaches.  Endo/Heme/Allergies: Does not bruise/bleed easily.     Physical Exam:  Vitals:   05/08/19 1527  BP: (!) 142/94  Pulse: 66  Temp: 98 F (36.7 C)  TempSrc: Oral  SpO2: 100%  Weight: 186 lb 9.6 oz (84.6 kg)  Height: 5\' 9"  (1.753 m)   Physical Exam  Constitutional: He is well-developed, well-nourished, and in no distress.  Cardiovascular: Normal rate, regular rhythm, normal heart sounds and intact distal pulses. Exam reveals no gallop and no friction rub.  No murmur heard. Pulmonary/Chest: Breath sounds normal. No respiratory distress. He has no wheezes. He has no rales. He exhibits no tenderness.   Abdominal: Soft. Bowel sounds are normal. He exhibits no distension. There is no abdominal tenderness. There is no guarding.  Musculoskeletal: Normal range of motion.        General: No edema.     Assessment & Plan:   See Encounters Tab for problem based charting.  Patient seen with Dr. Dareen Piano

## 2019-05-08 NOTE — Assessment & Plan Note (Signed)
Recent CBC showed WBC 3.4 Given recent history of prostate cancer w/metastasis to bone, will follow WBC count and patient advised to take extra precautions.

## 2019-05-08 NOTE — Assessment & Plan Note (Signed)
BP 142/94 today. Patient has reports he has cut out all sugar from diet and has limited his sodium intake to less than 2 grams daily. He reports avoiding all fried food and incorporates more fish into his diet. Patient prefers to continue with lifestyle modifications at this point.    - Continue lifestyle modifications - BP check in 1 month

## 2019-05-08 NOTE — Patient Instructions (Signed)
[name],   It was a pleasure seeing you in clinic today.  - Please continue your iron supplements as you have been taking. - For your BP, continue your lifestyle modifications.  - Please let us know how we can best support you through your chemo/radiation.    Thank you!    DASH Eating Plan DASH stands for "Dietary Approaches to Stop Hypertension." The DASH eating plan is a healthy eating plan that has been shown to reduce high blood pressure (hypertension). It may also reduce your risk for type 2 diabetes, heart disease, and stroke. The DASH eating plan may also help with weight loss. What are tips for following this plan?  General guidelines  Avoid eating more than 2,300 mg (milligrams) of salt (sodium) a day. If you have hypertension, you may need to reduce your sodium intake to 1,500 mg a day.  Limit alcohol intake to no more than 1 drink a day for nonpregnant women and 2 drinks a day for men. One drink equals 12 oz of beer, 5 oz of wine, or 1 oz of hard liquor.  Work with your health care provider to maintain a healthy body weight or to lose weight. Ask what an ideal weight is for you.  Get at least 30 minutes of exercise that causes your heart to beat faster (aerobic exercise) most days of the week. Activities may include walking, swimming, or biking.  Work with your health care provider or diet and nutrition specialist (dietitian) to adjust your eating plan to your individual calorie needs. Reading food labels   Check food labels for the amount of sodium per serving. Choose foods with less than 5 percent of the Daily Value of sodium. Generally, foods with less than 300 mg of sodium per serving fit into this eating plan.  To find whole grains, look for the word "whole" as the first word in the ingredient list. Shopping  Buy products labeled as "low-sodium" or "no salt added."  Buy fresh foods. Avoid canned foods and premade or frozen meals. Cooking  Avoid adding  salt when cooking. Use salt-free seasonings or herbs instead of table salt or sea salt. Check with your health care provider or pharmacist before using salt substitutes.  Do not fry foods. Cook foods using healthy methods such as baking, boiling, grilling, and broiling instead.  Cook with heart-healthy oils, such as olive, canola, soybean, or sunflower oil. Meal planning  Eat a balanced diet that includes: ? 5 or more servings of fruits and vegetables each day. At each meal, try to fill half of your plate with fruits and vegetables. ? Up to 6-8 servings of whole grains each day. ? Less than 6 oz of lean meat, poultry, or fish each day. A 3-oz serving of meat is about the same size as a deck of cards. One egg equals 1 oz. ? 2 servings of low-fat dairy each day. ? A serving of nuts, seeds, or beans 5 times each week. ? Heart-healthy fats. Healthy fats called Omega-3 fatty acids are found in foods such as flaxseeds and coldwater fish, like sardines, salmon, and mackerel.  Limit how much you eat of the following: ? Canned or prepackaged foods. ? Food that is high in trans fat, such as fried foods. ? Food that is high in saturated fat, such as fatty meat. ? Sweets, desserts, sugary drinks, and other foods with added sugar. ? Full-fat dairy products.  Do not salt foods before eating.  Try to eat at  least 2 vegetarian meals each week.  Eat more home-cooked food and less restaurant, buffet, and fast food.  When eating at a restaurant, ask that your food be prepared with less salt or no salt, if possible. What foods are recommended? The items listed may not be a complete list. Talk with your dietitian about what dietary choices are best for you. Grains Whole-grain or whole-wheat bread. Whole-grain or whole-wheat pasta. Brown rice. Modena Morrow. Bulgur. Whole-grain and low-sodium cereals. Pita bread. Low-fat, low-sodium crackers. Whole-wheat flour tortillas. Vegetables Fresh or frozen  vegetables (raw, steamed, roasted, or grilled). Low-sodium or reduced-sodium tomato and vegetable juice. Low-sodium or reduced-sodium tomato sauce and tomato paste. Low-sodium or reduced-sodium canned vegetables. Fruits All fresh, dried, or frozen fruit. Canned fruit in natural juice (without added sugar). Meat and other protein foods Skinless chicken or [place]. Ground chicken or [place]. Pork with fat trimmed off. Fish and seafood. Egg whites. Dried beans, peas, or lentils. Unsalted nuts, nut butters, and seeds. Unsalted canned beans. Lean cuts of beef with fat trimmed off. Low-sodium, lean deli meat. Dairy Low-fat (1%) or fat-free (skim) milk. Fat-free, low-fat, or reduced-fat cheeses. Nonfat, low-sodium ricotta or cottage cheese. Low-fat or nonfat yogurt. Low-fat, low-sodium cheese. Fats and oils Soft margarine without trans fats. Vegetable oil. Low-fat, reduced-fat, or light mayonnaise and salad dressings (reduced-sodium). Canola, safflower, olive, soybean, and sunflower oils. Avocado. Seasoning and other foods Herbs. Spices. Seasoning mixes without salt. Unsalted popcorn and pretzels. Fat-free sweets. What foods are not recommended? The items listed may not be a complete list. Talk with your dietitian about what dietary choices are best for you. Grains Baked goods made with fat, such as croissants, muffins, or some breads. Dry pasta or rice meal packs. Vegetables Creamed or fried vegetables. Vegetables in a cheese sauce. Regular canned vegetables (not low-sodium or reduced-sodium). Regular canned tomato sauce and paste (not low-sodium or reduced-sodium). Regular tomato and vegetable juice (not low-sodium or reduced-sodium). Angie Fava. Olives. Fruits Canned fruit in a light or heavy syrup. Fried fruit. Fruit in cream or butter sauce. Meat and other protein foods Fatty cuts of meat. Ribs. Fried meat. Berniece Salines. Sausage. Bologna and other processed lunch meats. Salami. Fatback. Hotdogs. Bratwurst.  Salted nuts and seeds. Canned beans with added salt. Canned or smoked fish. Whole eggs or egg yolks. Chicken or [place] with skin. Dairy Whole or 2% milk, cream, and half-and-half. Whole or full-fat cream cheese. Whole-fat or sweetened yogurt. Full-fat cheese. Nondairy creamers. Whipped toppings. Processed cheese and cheese spreads. Fats and oils Butter. Stick margarine. Lard. Shortening. Ghee. Bacon fat. Tropical oils, such as coconut, palm kernel, or palm oil. Seasoning and other foods Salted popcorn and pretzels. Onion salt, garlic salt, seasoned salt, table salt, and sea salt. Worcestershire sauce. Tartar sauce. Barbecue sauce. Teriyaki sauce. Soy sauce, including reduced-sodium. Steak sauce. Canned and packaged gravies. Fish sauce. Oyster sauce. Cocktail sauce. Horseradish that you find on the shelf. Ketchup. Mustard. Meat flavorings and tenderizers. Bouillon cubes. Hot sauce and Tabasco sauce. Premade or packaged marinades. Premade or packaged taco seasonings. Relishes. Regular salad dressings. Where to find more information:  National Heart, Lung, and [place]: https://wilson-eaton.com/  American Heart Association: www.heart.org Summary  The DASH eating plan is a healthy eating plan that has been shown to reduce high blood pressure (hypertension). It may also reduce your risk for type 2 diabetes, heart disease, and stroke.  With the DASH eating plan, you should limit salt (sodium) intake to 2,300 mg a day. If you have hypertension, you  may need to reduce your sodium intake to 1,500 mg a day.  When on the DASH eating plan, aim to eat more fresh fruits and vegetables, whole grains, lean proteins, low-fat dairy, and heart-healthy fats.  Work with your health care provider or diet and nutrition specialist (dietitian) to adjust your eating plan to your individual calorie needs. This information is not intended to replace advice given to you by your health care provider. Make sure you discuss any  questions you have with your health care provider. Document Released: 09/09/2011 Document Revised: 09/02/2017 Document Reviewed: 09/13/2016 Elsevier Patient Education  2020 [employer].

## 2019-05-08 NOTE — Assessment & Plan Note (Signed)
Patient f/u with urology.

## 2019-05-08 NOTE — Assessment & Plan Note (Signed)
Most recent Hb 10.2.   - Continue iron supplementation

## 2019-05-09 ENCOUNTER — Ambulatory Visit: Payer: Medicare Other

## 2019-05-09 ENCOUNTER — Ambulatory Visit
Admission: RE | Admit: 2019-05-09 | Discharge: 2019-05-09 | Disposition: A | Discharge status: Home / Self Care | Payer: Medicare Other | Source: Ambulatory Visit | Attending: Radiation Oncology | Admitting: Radiation Oncology

## 2019-05-09 ENCOUNTER — Other Ambulatory Visit: Payer: Self-pay

## 2019-05-09 DIAGNOSIS — C7951 Secondary malignant neoplasm of bone: Secondary | ICD-10-CM | POA: Insufficient documentation

## 2019-05-09 DIAGNOSIS — C61 Malignant neoplasm of prostate: Secondary | ICD-10-CM | POA: Diagnosis not present

## 2019-05-09 DIAGNOSIS — Z510 Encounter for antineoplastic radiation therapy: Secondary | ICD-10-CM | POA: Insufficient documentation

## 2019-05-09 NOTE — Progress Notes (Addendum)
Internal Medicine Clinic Attending  I saw and evaluated the patient.  I personally confirmed the key portions of the history and exam documented by Dr. Aslam and I reviewed pertinent patient test results.  The assessment, diagnosis, and plan were formulated together and I agree with the documentation in the resident's note.     

## 2019-05-10 ENCOUNTER — Ambulatory Visit: Payer: Medicare Other

## 2019-05-11 ENCOUNTER — Ambulatory Visit
Admission: RE | Admit: 2019-05-11 | Discharge: 2019-05-11 | Disposition: A | Discharge status: Home / Self Care | Payer: Medicare Other | Source: Ambulatory Visit | Attending: Radiation Oncology | Admitting: Radiation Oncology

## 2019-05-11 ENCOUNTER — Ambulatory Visit: Payer: Medicare Other

## 2019-05-11 ENCOUNTER — Other Ambulatory Visit: Payer: Self-pay

## 2019-05-11 DIAGNOSIS — C7951 Secondary malignant neoplasm of bone: Secondary | ICD-10-CM | POA: Diagnosis not present

## 2019-05-11 DIAGNOSIS — Z510 Encounter for antineoplastic radiation therapy: Secondary | ICD-10-CM | POA: Diagnosis not present

## 2019-05-14 ENCOUNTER — Ambulatory Visit
Admission: RE | Admit: 2019-05-14 | Discharge: 2019-05-14 | Disposition: A | Discharge status: Home / Self Care | Payer: Medicare Other | Source: Ambulatory Visit | Attending: Radiation Oncology | Admitting: Radiation Oncology

## 2019-05-14 ENCOUNTER — Other Ambulatory Visit: Payer: Self-pay

## 2019-05-14 DIAGNOSIS — C61 Malignant neoplasm of prostate: Secondary | ICD-10-CM

## 2019-05-14 DIAGNOSIS — C7951 Secondary malignant neoplasm of bone: Secondary | ICD-10-CM | POA: Diagnosis not present

## 2019-05-14 DIAGNOSIS — Z510 Encounter for antineoplastic radiation therapy: Secondary | ICD-10-CM | POA: Diagnosis not present

## 2019-05-16 ENCOUNTER — Other Ambulatory Visit: Payer: Self-pay

## 2019-05-16 ENCOUNTER — Ambulatory Visit
Admission: RE | Admit: 2019-05-16 | Discharge: 2019-05-16 | Disposition: A | Discharge status: Home / Self Care | Payer: Medicare Other | Source: Ambulatory Visit | Attending: Radiation Oncology | Admitting: Radiation Oncology

## 2019-05-16 DIAGNOSIS — C61 Malignant neoplasm of prostate: Secondary | ICD-10-CM

## 2019-05-16 DIAGNOSIS — Z510 Encounter for antineoplastic radiation therapy: Secondary | ICD-10-CM | POA: Diagnosis not present

## 2019-05-16 DIAGNOSIS — C7951 Secondary malignant neoplasm of bone: Secondary | ICD-10-CM | POA: Diagnosis not present

## 2019-05-18 ENCOUNTER — Encounter: Payer: Self-pay | Admitting: Radiation Oncology

## 2019-05-18 ENCOUNTER — Ambulatory Visit
Admission: RE | Admit: 2019-05-18 | Discharge: 2019-05-18 | Disposition: A | Discharge status: Home / Self Care | Payer: Medicare Other | Source: Ambulatory Visit | Attending: Radiation Oncology | Admitting: Radiation Oncology

## 2019-05-18 ENCOUNTER — Encounter: Payer: Self-pay | Admitting: Urology

## 2019-05-18 ENCOUNTER — Other Ambulatory Visit: Payer: Self-pay

## 2019-05-18 DIAGNOSIS — C7951 Secondary malignant neoplasm of bone: Secondary | ICD-10-CM | POA: Diagnosis not present

## 2019-05-18 DIAGNOSIS — C61 Malignant neoplasm of prostate: Secondary | ICD-10-CM

## 2019-05-18 DIAGNOSIS — Z510 Encounter for antineoplastic radiation therapy: Secondary | ICD-10-CM | POA: Diagnosis not present

## 2019-05-18 NOTE — Progress Notes (Signed)
  Radiation Oncology         [phone] ________________________________  Name: GIOVONNIE TRETTEL MRN: [id number]  Date: 05/18/2019  DOB: 1950/12/09  End of Treatment Note  Diagnosis:   68 y.o. gentleman with oligometastatic disease in the sacrum for stage IV oligometastatic adenocarcinoma of the prostate.     Indication for treatment:  Curative, Definitive SBRT       Radiation treatment dates:   05/09/19, 05/11/19, 05/14/19, 05/16/19, 05/18/19  Site/dose:   The oligometastatic disease in the sacrum was treated to 40 Gy in 5 fractions of 8 Gy eacj.  Beams/energy:   The patient was treated using stereotactic body radiotherapy according to a 3D conformal radiotherapy plan.  Volumetric arc fields were employed to deliver 6 MV X-rays.  Image guidance was performed with per fraction cone beam CT prior to treatment under personal MD supervision.  Immobilization was achieved using BodyFix Pillow.  Narrative: The patient tolerated radiation treatment relatively well. He denied pain. He continued with stiffness on his right side low back, when waking up each morning, that improves after he has been up moving, chronic and not progressive. He denied numbness or tingling of lower extremities or bowel or bladder complaints. He reported urinary frequency related to large intake of water daily, unchanged with treatments. 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 He reported that he received his last LUPRON injection in June under the care of Dr. Lovena Neighbours and confirmed that he has been receiving Lupron every three months.   Plan: The patient has completed radiation treatment. The patient will return to radiation oncology clinic for routine followup in one month. I advised them to call or return sooner if they have any questions or concerns related to their recovery or treatment. ________________________________  Sheral Apley.  Tammi Klippel, M.D.

## 2019-05-21 ENCOUNTER — Other Ambulatory Visit: Payer: Self-pay

## 2019-05-21 ENCOUNTER — Encounter (HOSPITAL_COMMUNITY)
Admission: RE | Admit: 2019-05-21 | Discharge: 2019-05-21 | Disposition: A | Discharge status: Home / Self Care | Payer: Medicare Other | Source: Ambulatory Visit | Attending: Urology | Admitting: Urology

## 2019-05-21 ENCOUNTER — Ambulatory Visit (INDEPENDENT_AMBULATORY_CARE_PROVIDER_SITE_OTHER): Payer: Medicare Other | Admitting: Podiatry

## 2019-05-21 ENCOUNTER — Encounter: Payer: Self-pay | Admitting: Podiatry

## 2019-05-21 VITALS — Temp 97.7°F

## 2019-05-21 DIAGNOSIS — E0842 Diabetes mellitus due to underlying condition with diabetic polyneuropathy: Secondary | ICD-10-CM

## 2019-05-21 DIAGNOSIS — R9721 Rising PSA following treatment for malignant neoplasm of prostate: Secondary | ICD-10-CM | POA: Insufficient documentation

## 2019-05-21 DIAGNOSIS — C7951 Secondary malignant neoplasm of bone: Secondary | ICD-10-CM | POA: Diagnosis not present

## 2019-05-21 DIAGNOSIS — B351 Tinea unguium: Secondary | ICD-10-CM

## 2019-05-21 DIAGNOSIS — M79676 Pain in unspecified toe(s): Secondary | ICD-10-CM

## 2019-05-21 DIAGNOSIS — L989 Disorder of the skin and subcutaneous tissue, unspecified: Secondary | ICD-10-CM | POA: Diagnosis not present

## 2019-05-21 MED ORDER — TECHNETIUM TC 99M MEDRONATE IV KIT
20.0000 | PACK | Freq: Once | INTRAVENOUS | Status: AC | PRN
  Administered 2019-05-21: 21 via INTRAVENOUS

## 2019-05-23 NOTE — Progress Notes (Signed)
    Subjective: Patient is a 68 y.o. male presenting to the office today for follow up evaluation of painful callus lesions of the left foot. Wearing shoes and walking increases the pain. He has not done anything for treatment at home.   Patient also complains of elongated, thickened nails that cause pain while ambulating in shoes. He is unable to trim his own nails. Patient presents today for further treatment and evaluation.  Past Medical History:  Diagnosis Date  . Duodenitis   . Essential hypertension 07/16/2015  . Glaucoma   . H/O: substance abuse ([place])   . History of frostbite   . History of radiation therapy nov 2015 to feb 2016  . Iron deficiency anemia   . Peripheral neuropathy   . Prostate cancer ([place]) 03/05/14   Gleason 7, volume 45 gm  . Rash and nonspecific skin eruption 08/04/2017  . S/P radiation therapy 09/18/14-11/15/14   prostate/ 7800Gy/40sessions  . Thrombocytopenia ([facility])     Objective:  Physical Exam General: Alert and oriented x3 in no acute distress  Dermatology: Hyperkeratotic lesions present on the left foot x 3. Pain on palpation with a central nucleated core noted. Skin is warm, dry and supple bilateral lower extremities. Negative for open lesions or macerations. Nails are tender, long, thickened and dystrophic with subungual debris, consistent with onychomycosis, 1-5 bilateral. No signs of infection noted.  Vascular: Palpable pedal pulses bilaterally. No edema or erythema noted. Capillary refill within normal limits.  Neurological: Epicritic and protective threshold grossly intact bilaterally.   Musculoskeletal Exam: Pain on palpation at the keratotic lesions noted. Range of motion within normal limits bilateral. Muscle strength 5/5 in all groups bilateral.  Assessment: 1. Onychodystrophic nails 1-5 bilateral with hyperkeratosis of nails.  2. Onychomycosis of nail due to dermatophyte bilateral 3. Pre-ulcerative callus lesions noted to the left foot    Plan of Care:  1. Patient evaluated. 2. Excisional debridement of keratoic lesions using a chisel blade was performed without incident.  3. Dressed with light dressing. 4. Mechanical debridement of nails 1-5 bilaterally performed using a nail nipper. Filed with dremel without incident.  5. Patient is to return to the clinic in 3 months.   Edrick Kins, DPM [facility]  Dr. Edrick Kins, [address]                                        [place], [place] [address]                Office [phone]  Fax [phone]

## 2019-05-29 ENCOUNTER — Encounter: Payer: Self-pay | Admitting: *Deleted

## 2019-05-29 NOTE — Progress Notes (Unsigned)
Dear PCP Your patient is scheduled for a Medicare annual wellness visit with the RN.  Please fill out the following required information.  You do not have to address every Medicare Covered Preventative Screenings and Services listed in the chart below but selected the items most meaningful to your patient and limit the number so that it is manageable for the patient.  Please return the form within the next 7 days to Tamaj Jurgens.    Things That May Be Affecting Your Health:  Alcohol  Hearing loss  Pain    Depression  Home Safety  Sexual Health   Diabetes  Lack of physical activity  Stress   Difficulty with daily activities  Loneliness  Tiredness   Drug use  Medicines  Tobacco use   Falls  Motor Vehicle Safety  Weight   Food choices  Oral Health  Other    YOUR PERSONALIZED HEALTH PLAN : 1. Schedule your next subsequent Medicare Wellness visit in one year 2. Attend all of your regular appointments to address your medical issues 3. Complete the preventative screenings and services   Annual Wellness Visit   Medicare Covered Preventative Screenings and Services  Services & Screenings Men and Women Who How Often Need? Date of Last Service Action  Abdominal Aortic Aneurysm Adults with AAA risk factors Once     Alcohol Misuse and Counseling All Adults Screening once a year if no alcohol misuse. Counseling up to 4 face to face sessions.     Bone Density Measurement  Adults at risk for osteoporosis Once every 2 yrs     Lipid Panel Z13.6 All adults without CV disease Once every 5 yrs     Colorectal Cancer   Stool sample or  Colonoscopy All adults 50 and older   Once every year  Every 10 years     Depression All Adults Once a year  Today   Diabetes Screening Blood glucose, post glucose load, or GTT Z13.1  All adults at risk  Pre-diabetics  Once per year  Twice per year     Diabetes  Self-Management Training All adults Diabetics 10 hrs first year; 2 hours subsequent years. Requires  Copay     Glaucoma  Diabetics  Family history of glaucoma  African Americans 50 yrs +  Hispanic Americans 65 yrs + Annually - requires coppay     Hepatitis C Z72.89 or F19.20  High Risk for HCV  Born between 1945 and 1965  Annually  Once     HIV Z11.4 All adults based on risk  Annually btw ages 15 & 65 regardless of risk  Annually > 65 yrs if at increased risk     Lung Cancer Screening Asymptomatic adults aged 55-77 with 30 pack yr history and current smoker OR quit within the last 15 yrs Annually Must have counseling and shared decision making documentation before first screen     Medical Nutrition Therapy Adults with   Diabetes  Renal disease  Kidney transplant within past 3 yrs 3 hours first year; 2 hours subsequent years     Obesity and Counseling All adults Screening once a year Counseling if BMI 30 or higher  Today   Tobacco Use Counseling Adults who use tobacco  Up to 8 visits in one year     Vaccines Z23  Hepatitis B  Influenza   Pneumonia  Adults   Once  Once every flu season  Two different vaccines separated by one year     Next Annual Wellness   Visit People with Medicare Every year  Today     Services & Screenings Women Who How Often Need  Date of Last Service Action  Mammogram  Z12.31 Women over 40 One baseline ages 35-39. Annually ager 40 yrs+     Pap tests All women Annually if high risk. Every 2 yrs for normal risk women     Screening for cervical cancer with   Pap (Z01.419 nl or Z01.411abnl) &  HPV Z11.51 Women aged 30 to 65 Once every 5 yrs     Screening pelvic and breast exams All women Annually if high risk. Every 2 yrs for normal risk women     Sexually Transmitted Diseases  Chlamydia  Gonorrhea  Syphilis All at risk adults Annually for non pregnant females at increased risk         Services & Screenings Men Who How Ofter Need  Date of Last Service Action  Prostate Cancer - DRE & PSA Men over 50 Annually.  DRE might  require a copay.     Sexually Transmitted Diseases  Syphilis All at risk adults Annually for men at increased risk       

## 2019-06-05 ENCOUNTER — Ambulatory Visit (INDEPENDENT_AMBULATORY_CARE_PROVIDER_SITE_OTHER): Payer: Medicare Other | Admitting: Internal Medicine

## 2019-06-05 ENCOUNTER — Encounter: Payer: Self-pay | Admitting: Internal Medicine

## 2019-06-05 ENCOUNTER — Other Ambulatory Visit: Payer: Self-pay

## 2019-06-05 VITALS — BP 132/88 | HR 80 | Temp 98.1°F | Ht 69.0 in | Wt 182.0 lb

## 2019-06-05 DIAGNOSIS — C61 Malignant neoplasm of prostate: Secondary | ICD-10-CM | POA: Diagnosis not present

## 2019-06-05 DIAGNOSIS — H409 Unspecified glaucoma: Secondary | ICD-10-CM

## 2019-06-05 DIAGNOSIS — D509 Iron deficiency anemia, unspecified: Secondary | ICD-10-CM

## 2019-06-05 DIAGNOSIS — C7951 Secondary malignant neoplasm of bone: Secondary | ICD-10-CM

## 2019-06-05 DIAGNOSIS — Z79899 Other long term (current) drug therapy: Secondary | ICD-10-CM

## 2019-06-05 DIAGNOSIS — I10 Essential (primary) hypertension: Secondary | ICD-10-CM | POA: Diagnosis not present

## 2019-06-05 DIAGNOSIS — Z23 Encounter for immunization: Secondary | ICD-10-CM

## 2019-06-05 DIAGNOSIS — Z0000 Encounter for general adult medical examination without abnormal findings: Secondary | ICD-10-CM

## 2019-06-05 DIAGNOSIS — Z8719 Personal history of other diseases of the digestive system: Secondary | ICD-10-CM

## 2019-06-05 MED ORDER — TRIAMCINOLONE ACETONIDE 0.025 % EX OINT: 1.0000 "application " | TOPICAL_OINTMENT | Freq: Two times a day (BID) | CUTANEOUS | 2 refills | Status: DC

## 2019-06-05 NOTE — Assessment & Plan Note (Addendum)
Patient with Hx of IDA with GI work up revealing hyperplastic polyp and small colonic AVM in 2015 that were removed and ablated, respectively. Patient has been on ferrous sulfate 325mg /day. Most recent Hb 10.5. Patient asymptomatic. Will consider discontinuing iron supplementation pending iron panel results as prior ferritin was normal.   - Iron panel  - Continue ferrous sulfate 325mg  qd for now   ADDENDUM: Iron panel consistent with continued IDA. Called to relay results to patient. He reports that he would like to schedule a visit in office to discuss this further as he attempted to donate plasma and was instructed to see a physician regarding his low iron levels. Reassured patient that he is already on iron supplementation but if he would like to be further evaluated, he can visit the office. He denies any symptoms of anemia and continues to take his daily iron supplementation.

## 2019-06-05 NOTE — Assessment & Plan Note (Signed)
BP 132/88 at this visit. Patient continues to follow a low sodium and heart healthy diet. He has limited his sodium intake to 1200mg  and also reports cutting out all fried foods and soft drinks from diet. He has been incorporating more fruits into his day and prepares meals at home with herbs.   - Continue with lifestyle modifications - Follow up in 3 months

## 2019-06-05 NOTE — Progress Notes (Signed)
   CC: f/u for HTN  HPI:  [name] is a 68 y.o.  Male with PMHx as listed below presenting to clinic for hypertension follow up. Patient has no acute concerns at this time. Please see problem based charting for further analysis.  Patient would like flu shot at this visit.  Past Medical History:  Diagnosis Date  . Duodenitis   . Essential hypertension 07/16/2015  . Glaucoma   . H/O: substance abuse ([place])   . History of frostbite   . History of radiation therapy nov 2015 to feb 2016  . Iron deficiency anemia   . Peripheral neuropathy   . Prostate cancer ([place]) 03/05/14   Gleason 7, volume 45 gm  . Rash and nonspecific skin eruption 08/04/2017  . S/P radiation therapy 09/18/14-11/15/14   prostate/ 7800Gy/40sessions  . Thrombocytopenia ([place])    Review of Systems:  Review of Systems  Constitutional: Negative for chills, fever and malaise/fatigue.  Eyes: Negative for blurred vision and double vision.  Respiratory: Negative for cough, shortness of breath and wheezing.   Cardiovascular: Negative for chest pain.  Gastrointestinal: Negative for abdominal pain, constipation and diarrhea.  Musculoskeletal: Negative for back pain, joint pain and myalgias.  Neurological: Negative for dizziness, tingling, focal weakness, weakness and headaches.     Physical Exam:  Vitals:   06/05/19 1608  BP: 132/88  Pulse: 80  Temp: 98.1 F (36.7 C)  TempSrc: Oral  SpO2: 100%  Weight: 182 lb (82.6 kg)  Height: 5\' 9"  (1.753 m)   Physical Exam Constitutional:      General: He is not in acute distress.    Appearance: Normal appearance. He is normal weight. He is not ill-appearing.  Cardiovascular:     Rate and Rhythm: Normal rate and regular rhythm.     Pulses: Normal pulses.     Heart sounds: Normal heart sounds. No murmur. No friction rub. No gallop.   Pulmonary:     Effort: Pulmonary effort is normal. No respiratory distress.     Breath sounds: Normal breath sounds. No wheezing  or rhonchi.  Musculoskeletal: Normal range of motion.  Skin:    General: Skin is warm and dry.  Neurological:     General: No focal deficit present.     Mental Status: He is alert and oriented to person, place, and time. Mental status is at baseline.     Motor: No weakness.      Assessment & Plan:   See Encounters Tab for problem based charting.  Patient seen with Dr. Angelia Mould

## 2019-06-05 NOTE — Assessment & Plan Note (Signed)
Patient continues to use eye drops and has appointment with ophthalmologist coming up on September 12th. No acute changes in vision reported at this time.  - Continue eye drops

## 2019-06-05 NOTE — Assessment & Plan Note (Signed)
Patient found to have bone metastasis from prostate cancer in January 2020 and has been following up with [facility] for Lupron shots and has been seeing Dr. Zigmund Daniel, radiation oncologist, for stereotactic radiation. Last radiation treatment was 2 weeks ago. Patient reports he is doing well and denies any adverse side effects. He continues to be in good spirits and is following up closely with Dr. Gilford Rile and Dr. Zigmund Daniel.   - Continue to monitor and f/u with urologist and radiation oncologist

## 2019-06-05 NOTE — Assessment & Plan Note (Signed)
Pt received flu vaccine today. 

## 2019-06-05 NOTE — Patient Instructions (Addendum)
William Jones,   It was a pleasure seeing you in clinic today. For your blood pressure, continue the lifestyle modifications that you have been implementing. You are doing a great job!   We checked some bloodwork today for your cholesterol and iron levels. We will contact you with the results.  Please let us know if you have any concerns.  Thank you!

## 2019-06-06 LAB — IRON,TIBC AND FERRITIN PANEL
Ferritin: 76 ng/mL (ref 30–400)
Iron Saturation: 11 % — ABNORMAL LOW (ref 15–55)
Iron: 34 ug/dL — ABNORMAL LOW (ref 38–169)
Total Iron Binding Capacity: 318 ug/dL (ref 250–450)
UIBC: 284 ug/dL (ref 111–343)

## 2019-06-06 LAB — LIPID PANEL
Chol/HDL Ratio: 4.7 ratio (ref 0.0–5.0)
Cholesterol, Total: 245 mg/dL — ABNORMAL HIGH (ref 100–199)
HDL: 52 mg/dL (ref >39)
LDL Chol Calc (NIH): 171 mg/dL — ABNORMAL HIGH (ref 0–99)
Triglycerides: 120 mg/dL (ref 0–149)
VLDL Cholesterol Cal: 22 mg/dL (ref 5–40)

## 2019-06-07 NOTE — Progress Notes (Signed)
Internal Medicine Clinic Attending  I saw and evaluated the patient.  I personally confirmed the key portions of the history and exam documented by Dr. Marva Panda and I reviewed pertinent patient test results.  The assessment, diagnosis, and plan were formulated together and I agree with the documentation in the resident's note.    While his ferritin is in the normal range his Tsat is low, I would have him continue oral iron supplementation.

## 2019-06-08 ENCOUNTER — Telehealth: Payer: Medicare Other | Admitting: Internal Medicine

## 2019-06-08 ENCOUNTER — Other Ambulatory Visit: Payer: Self-pay

## 2019-06-08 ENCOUNTER — Encounter: Payer: Self-pay | Admitting: Internal Medicine

## 2019-06-08 ENCOUNTER — Ambulatory Visit (INDEPENDENT_AMBULATORY_CARE_PROVIDER_SITE_OTHER): Payer: Medicare Other | Admitting: Internal Medicine

## 2019-06-08 DIAGNOSIS — Z0000 Encounter for general adult medical examination without abnormal findings: Secondary | ICD-10-CM

## 2019-06-08 NOTE — Progress Notes (Signed)
This AWV is being conducted by [place] only. The patient was located at home and I was located in [facility]. The patient's identity was confirmed using their DOB and current address. The patient or his/her legal guardian has consented to being evaluated through a telephone encounter and understands the associated risks (an examination cannot be done and the patient may need to come in for an appointment) / benefits (allows the patient to remain at home, decreasing exposure to coronavirus). I personally spent 31 minutes conducting the AWV.  Subjective:   William Jones is a 68 y.o. male who presents for a [employer] Visit.  The following items have been reviewed and updated today in the appropriate area in the EMR.   Health Risk Assessment  Height, weight, BMI, and BP Visual acuity if needed Depression screen Fall risk / safety level Advance directive discussion Medical and family history were reviewed and updated Updating list of other providers & suppliers Medication reconciliation, including over the counter medicines Cognitive screen Written screening schedule Risk Factor list Personalized health advice, risky behaviors, and treatment advice  Social History   Social History Narrative   On disability - frostbite. Used to work for [employer]. Retired in 1990. Has 2 kids. 6th grade. Resides with significant other.      Current Social History 06/08/2019        Patient lives with a significant other in a/an apartment which is 1 story/stories. There are steps up to the entrance the patient uses.       Patient's method of transportation is personal car.      The highest level of education was some high school.      The patient currently disabled.      Identified important Relationships are Scientist, water quality (Fiance) and Agnes Lawrence (Daughter)       Pets : None       Interests / Fun: "Going to the park and walking around. Go visit people that we  know."       Current Stressors: "I don't have any"       Religious / Personal Beliefs: "Holiness"       L. Ducatte, RN, BSN             Objective:    Vitals: There were no vitals taken for this visit. Vitals are unable to obtained due to [id number] public health emergency  Activities of Daily Living In your present state of health, do you have any difficulty performing the following activities: 06/08/2019 06/05/2019  Hearing? N N  Vision? Y N  Comment next eye appt 06/16/2019 -  Difficulty concentrating or making decisions? N N  Walking or climbing stairs? N N  Dressing or bathing? N N  Doing errands, shopping? N N  Some recent data might be hidden    Goals Goals    . Blood Pressure < 140/90 (pt-stated)    . DIET - EAT MORE FRUITS AND VEGETABLES (pt-stated)    . Weight (lb) < 175 lb (79.4 kg) (pt-stated)       Fall Risk Fall Risk  06/08/2019 06/05/2019 05/08/2019 04/10/2019 04/10/2019  Falls in the past year? 0 0 0 0 0  Number falls in past yr: - - 0 - -  Risk for fall due to : Impaired balance/gait;Impaired mobility - - - -  Follow up - Falls prevention discussed Falls prevention discussed - Falls prevention discussed    Depression Screen PHQ 2/9 Scores 06/08/2019 06/05/2019  05/08/2019 04/10/2019  PHQ - 2 Score 0 0 0 0  PHQ- 9 Score 1 - - -     Cognitive Testing Six-Item Cognitive Screener   "I would like to ask you some questions that ask you to use your memory. I am going to name three objects. Please wait until I say all three words, then repeat them. Remember what they are  because I am going to ask you to name them again in a few minutes. Please repeat these words for me: APPLE-TABLE-PENNY." (Interviewer may repeat names 3 times if necessary but repetition not scored.)  Did patient correctly repeat all three words? Yes - may proceed with screen  What year is this? Correct What month is this? Correct What day of the week is this? Correct  What were the three objects I asked  you to remember? . Apple Unable to remember . Table Correct . Penny Correct  Score one point for each incorrect answer.  A score of 2 or more points warrants additional investigation.  Patient's score 1    Assessment and Plan:     Patient has appt with Dr. Noel Gerold (Ophthalmologist) on 06/16/2019 He has made the following health goals today: BP less than 140/90, Weight less than 175 lb, Eating more fruits and vegetables. He has been choosing low salt foods, lean meats, and avoiding fried, fatty foods. He will begin seated and standing exercises with exercise band.  During the course of the visit the patient was educated and counseled about appropriate screening and preventive services as documented in the assessment and plan.  The printed AVS was given to the patient and included an updated screening schedule, a list of risk factors, and personalized health advice.        Velora Heckler, RN  06/08/2019

## 2019-06-08 NOTE — Progress Notes (Signed)
I discussed the AWV findings with the RN who conducted the visit. I was present in the office suite and immediately available to provide assistance and direction throughout the time the service was provided.   

## 2019-06-08 NOTE — Patient Instructions (Addendum)
Annual Wellness Visit   Medicare Covered Preventative Screenings and Services  Services & Screenings Men and Women Who How Often Need? Date of Last Service Action  Abdominal Aortic Aneurysm Adults with AAA risk factors Once Yes  Discuss with Dr. Marva Panda at next visit  Alcohol Misuse and Counseling All Adults Screening once a year if no alcohol misuse. Counseling up to 4 face to face sessions.     Bone Density Measurement  Adults at risk for osteoporosis Once every 2 yrs     Lipid Panel Z13.6 All adults without CV disease Once every 5 yrs  06/05/2019  Total = 245 Triglycerides = 120 HDL = 52 LDL = 175  Colorectal Cancer   Stool sample or  Colonoscopy All adults 66 and older   Once every year  Every 10 years     Depression All Adults Once a year  Today   Diabetes Screening Blood glucose, post glucose load, or GTT Z13.1  All adults at risk  Pre-diabetics  Once per year  Twice per year     Diabetes  Self-Management Training All adults Diabetics 10 hrs first year; 2 hours subsequent years. Requires Copay     Glaucoma  Diabetics  Family history of glaucoma  African Americans 57 yrs +  Hispanic Americans 3 yrs + Annually - requires coppay    Appt on 06/16/2019 with Dr. Noel Gerold  Hepatitis C Z72.89 or F19.20  High Risk for HCV  Born between 1945 and 1965  Annually  Once     HIV Z11.4 All adults based on risk  Annually btw ages 52 & 54 regardless of risk  Annually > 65 yrs if at increased risk     Lung Cancer Screening Asymptomatic adults aged 16-77 with 30 pack yr history and current smoker OR quit within the last 15 yrs Annually Must have counseling and shared decision making documentation before first screen     Medical Nutrition Therapy Adults with   Diabetes  Renal disease  Kidney transplant within past 3 yrs 3 hours first year; 2 hours subsequent years     Obesity and Counseling All adults Screening once a year Counseling if BMI 30 or higher  Today    Tobacco Use Counseling Adults who use tobacco  Up to 8 visits in one year     Vaccines Z23  Hepatitis B  Influenza   Pneumonia  Adults   Once  Once every flu season  Two different vaccines separated by one year     Next Annual Wellness Visit People with Medicare Every year  Today     Services & Screenings Women Who How Often Need  Date of Last Service Action  Mammogram  Z12.31 Women over 48 One baseline ages 31-39. Annually ager 40 yrs+     Pap tests All women Annually if high risk. Every 2 yrs for normal risk women     Screening for cervical cancer with   Pap (Z01.419 nl or Z01.411abnl) &  HPV Z11.51 Women aged 6 to 72 Once every 5 yrs     Screening pelvic and breast exams All women Annually if high risk. Every 2 yrs for normal risk women     Sexually Transmitted Diseases  Chlamydia  Gonorrhea  Syphilis All at risk adults Annually for non pregnant females at increased risk         [place] Men Who How Ofter Need  Date of Last Service Action  Prostate Cancer - DRE & PSA  Men over 21 Annually.  DRE might require a copay.     Sexually Transmitted Diseases  Syphilis All at risk adults Annually for men at increased risk         Things That May Be Affecting Your Health:  Alcohol  Hearing loss  Pain    Depression  Home Safety  Sexual Health   Diabetes  Lack of physical activity  Stress   Difficulty with daily activities  Loneliness  Tiredness   Drug use X Medicines  Tobacco use   Falls  Motor Vehicle Safety  Weight   Food choices  Oral Health  Other    YOUR PERSONALIZED HEALTH PLAN : 1. Schedule your next subsequent Medicare Wellness visit in one year 2. Attend all of your regular appointments to address your medical issues 3. Complete the preventative screenings and services 4. Keep appt with Dr. Noel Gerold on 06/16/2019 5. Congratulations on the health goals you made today!! BP less than 140/90, Weight less than 175 lb, Eating more fruits and  vegetables. 6. Keep up the good work with choosing low salt foods, lean meats, and avoiding fried, fatty foods!! 7. Begin seated and standing exercises with exercise band.   Fat and Cholesterol Restricted Eating Plan Eating a diet that limits fat and cholesterol may help lower your risk for heart disease and other conditions. Your body needs fat and cholesterol for basic functions, but eating too much of these things can be harmful to your health. Your health care provider may order lab tests to check your blood fat (lipid) and cholesterol levels. This helps your health care provider understand your risk for certain conditions and whether you need to make diet changes. Work with your health care provider or dietitian to make an eating plan that is right for you. Your plan includes:  Limit your fat intake to ______% or less of your total calories a day.  Limit your saturated fat intake to ______% or less of your total calories a day.  Limit the amount of cholesterol in your diet to less than _________mg a day.  Eat ___________ g of fiber a day. What are tips for following this plan? General guidelines   If you are overweight, work with your health care provider to lose weight safely. Losing just 5-10% of your body weight can improve your overall health and help prevent diseases such as diabetes and heart disease.  Avoid: ? Foods with added sugar. ? Fried foods. ? Foods that contain partially hydrogenated oils, including stick margarine, some tub margarines, cookies, crackers, and other baked goods.  Limit alcohol intake to no more than 1 drink a day for nonpregnant women and 2 drinks a day for men. One drink equals 12 oz of beer, 5 oz of wine, or 1 oz of hard liquor. Reading food labels  Check food labels for: ? Trans fats, partially hydrogenated oils, or high amounts of saturated fat. Avoid foods that contain saturated fat and trans fat. ? The amount of cholesterol in each serving.  Try to eat no more than 200 mg of cholesterol each day. ? The amount of fiber in each serving. Try to eat at least 20-30 g of fiber each day.  Choose foods with healthy fats, such as: ? Monounsaturated and polyunsaturated fats. These include olive and canola oil, flaxseeds, walnuts, almonds, and seeds. ? Omega-3 fats. These are found in foods such as salmon, mackerel, sardines, tuna, flaxseed oil, and ground flaxseeds.  Choose grain products that have whole  grains. Look for the word "whole" as the first word in the ingredient list. Cooking  Cook foods using methods other than frying. Baking, boiling, grilling, and broiling are some healthy options.  Eat more home-cooked food and less restaurant, buffet, and fast food.  Avoid cooking using saturated fats. ? Animal sources of saturated fats include meats, butter, and cream. ? Plant sources of saturated fats include palm oil, palm kernel oil, and coconut oil. Meal planning   At meals, imagine dividing your plate into fourths: ? Fill one-half of your plate with vegetables and green salads. ? Fill one-fourth of your plate with whole grains. ? Fill one-fourth of your plate with lean protein foods.  Eat fish that is high in omega-3 fats at least two times a week.  Eat more foods that contain fiber, such as whole grains, beans, apples, broccoli, carrots, peas, and barley. These foods help promote healthy cholesterol levels in the blood. Recommended foods Grains  Whole grains, such as whole wheat or whole grain breads, crackers, cereals, and pasta. Unsweetened oatmeal, bulgur, barley, quinoa, or brown rice. Corn or whole wheat flour tortillas. Vegetables  Fresh or frozen vegetables (raw, steamed, roasted, or grilled). Green salads. Fruits  All fresh, canned (in natural juice), or frozen fruits. Meats and other protein foods  Ground beef (85% or leaner), grass-fed beef, or beef trimmed of fat. Skinless chicken or [place]. Ground chicken  or [place]. Pork trimmed of fat. All fish and seafood. Egg whites. Dried beans, peas, or lentils. Unsalted nuts or seeds. Unsalted canned beans. Natural nut butters without added sugar and oil. Dairy  Low-fat or nonfat dairy products, such as skim or 1% milk, 2% or reduced-fat cheeses, low-fat and fat-free ricotta or cottage cheese, or plain low-fat and nonfat yogurt. Fats and oils  Tub margarine without trans fats. Light or reduced-fat mayonnaise and salad dressings. Avocado. Olive, canola, sesame, or safflower oils. The items listed above may not be a complete list of recommended foods or beverages. Contact your dietitian for more options. Foods to avoid Grains  White bread. White pasta. White rice. Cornbread. Bagels, pastries, and croissants. Crackers and snack foods that contain trans fat and hydrogenated oils. Vegetables  Vegetables cooked in cheese, cream, or butter sauce. Fried vegetables. Fruits  Canned fruit in heavy syrup. Fruit in cream or butter sauce. Fried fruit. Meats and other protein foods  Fatty cuts of meat. Ribs, chicken wings, bacon, sausage, bologna, salami, chitterlings, fatback, hot dogs, bratwurst, and packaged lunch meats. Liver and organ meats. Whole eggs and egg yolks. Chicken and [place] with skin. Fried meat. Dairy  Whole or 2% milk, cream, half-and-half, and cream cheese. Whole milk cheeses. Whole-fat or sweetened yogurt. Full-fat cheeses. Nondairy creamers and whipped toppings. Processed cheese, cheese spreads, and cheese curds. Beverages  Alcohol. Sugar-sweetened drinks such as sodas, lemonade, and fruit drinks. Fats and oils  Butter, stick margarine, lard, shortening, ghee, or bacon fat. Coconut, palm kernel, and palm oils. Sweets and desserts  Corn syrup, sugars, honey, and molasses. Candy. Jam and jelly. Syrup. Sweetened cereals. Cookies, pies, cakes, donuts, muffins, and ice cream. The items listed above may not be a complete list of foods and  beverages to avoid. Contact your dietitian for more information. Summary  Your body needs fat and cholesterol for basic functions. However, eating too much of these things can be harmful to your health.  Work with your health care provider and dietitian to follow a diet low in fat and cholesterol. Doing this may  help lower your risk for heart disease and other conditions.  Choose healthy fats, such as monounsaturated and polyunsaturated fats, and foods high in omega-3 fatty acids.  Eat fiber-rich foods, such as whole grains, beans, peas, fruits, and vegetables.  Limit or avoid alcohol, fried foods, and foods high in saturated fats, partially hydrogenated oils, and sugar. This information is not intended to replace advice given to you by your health care provider. Make sure you discuss any questions you have with your health care provider. Document Released: 09/20/2005 Document Revised: 09/02/2017 Document Reviewed: 06/07/2017 Elsevier Patient Education  [place] in the Home, Adult Falls can cause injuries. They can happen to people of all ages. There are many things you can do to make your home safe and to help prevent falls. Ask for help when making these changes, if needed. What actions can I take to prevent falls? General Instructions  Use good lighting in all rooms. Replace any light bulbs that burn out.  Turn on the lights when you go into a dark area. Use night-lights.  Keep items that you use often in easy-to-reach places. Lower the shelves around your home if necessary.  Set up your furniture so you have a clear path. Avoid moving your furniture around.  Do not have throw rugs and other things on the floor that can make you trip.  Avoid walking on wet floors.  If any of your floors are uneven, fix them.  Add color or contrast paint or tape to clearly mark and help you see: ? Any grab bars or handrails. ? First and last steps of stairways. ?  Where the edge of each step is.  If you use a stepladder: ? Make sure that it is fully opened. Do not climb a closed stepladder. ? Make sure that both sides of the stepladder are locked into place. ? Ask someone to hold the stepladder for you while you use it.  If there are any pets around you, be aware of where they are. What can I do in the bathroom?      Keep the floor dry. Clean up any water that spills onto the floor as soon as it happens.  Remove soap buildup in the tub or shower regularly.  Use non-skid mats or decals on the floor of the tub or shower.  Attach bath mats securely with double-sided, non-slip rug tape.  If you need to sit down in the shower, use a plastic, non-slip stool.  Install grab bars by the toilet and in the tub and shower. Do not use towel bars as grab bars. What can I do in the bedroom?  Make sure that you have a light by your bed that is easy to reach.  Do not use any sheets or blankets that are too big for your bed. They should not hang down onto the floor.  Have a firm chair that has side arms. You can use this for support while you get dressed. What can I do in the kitchen?  Clean up any spills right away.  If you need to reach something above you, use a strong step stool that has a grab bar.  Keep electrical cords out of the way.  Do not use floor polish or wax that makes floors slippery. If you must use wax, use non-skid floor wax. What can I do with my stairs?  Do not leave any items on the stairs.  Make sure that you have  a light switch at the top of the stairs and the bottom of the stairs. If you do not have them, ask someone to add them for you.  Make sure that there are handrails on both sides of the stairs, and use them. Fix handrails that are broken or loose. Make sure that handrails are as long as the stairways.  Install non-slip stair treads on all stairs in your home.  Avoid having throw rugs at the top or bottom of the  stairs. If you do have throw rugs, attach them to the floor with carpet tape.  Choose a carpet that does not hide the edge of the steps on the stairway.  Check any carpeting to make sure that it is firmly attached to the stairs. Fix any carpet that is loose or worn. What can I do on the outside of my home?  Use bright outdoor lighting.  Regularly fix the edges of walkways and driveways and fix any cracks.  Remove anything that might make you trip as you walk through a door, such as a raised step or threshold.  Trim any bushes or trees on the path to your home.  Regularly check to see if handrails are loose or broken. Make sure that both sides of any steps have handrails.  Install guardrails along the edges of any raised decks and porches.  Clear walking paths of anything that might make someone trip, such as tools or rocks.  Have any leaves, snow, or ice cleared regularly.  Use sand or salt on walking paths during winter.  Clean up any spills in your garage right away. This includes grease or oil spills. What other actions can I take?  Wear shoes that: ? Have a low heel. Do not wear high heels. ? Have rubber bottoms. ? Are comfortable and fit you well. ? Are closed at the toe. Do not wear open-toe sandals.  Use tools that help you move around (mobility aids) if they are needed. These include: ? Canes. ? Walkers. ? Scooters. ? Crutches.  Review your medicines with your doctor. Some medicines can make you feel dizzy. This can increase your chance of falling. Ask your doctor what other things you can do to help prevent falls. Where to find more information  Centers for Disease Control and Prevention, STEADI: https://garcia.biz/  [employer] on Aging: BrainJudge.co.uk Contact a doctor if:  You are afraid of falling at home.  You feel weak, drowsy, or dizzy at home.  You fall at home. Summary  There are many simple things that you can do to make your  home safe and to help prevent falls.  Ways to make your home safe include removing tripping hazards and installing grab bars in the bathroom.  Ask for help when making these changes in your home. This information is not intended to replace advice given to you by your health care provider. Make sure you discuss any questions you have with your health care provider. Document Released: 07/17/2009 Document Revised: 01/11/2019 Document Reviewed: 05/05/2017 Elsevier Patient Education  2020 [place] Maintenance, Male Adopting a healthy lifestyle and getting preventive care are important in promoting health and wellness. Ask your health care provider about:  The right schedule for you to have regular tests and exams.  Things you can do on your own to prevent diseases and keep yourself healthy. What should I know about diet, weight, and exercise? Eat a healthy diet   Eat a diet that includes plenty  of vegetables, fruits, low-fat dairy products, and lean protein.  Do not eat a lot of foods that are high in solid fats, added sugars, or sodium. Maintain a healthy weight Body mass index (BMI) is a measurement that can be used to identify possible weight problems. It estimates body fat based on height and weight. Your health care provider can help determine your BMI and help you achieve or maintain a healthy weight. Get regular exercise Get regular exercise. This is one of the most important things you can do for your health. Most adults should:  Exercise for at least 150 minutes each week. The exercise should increase your heart rate and make you sweat (moderate-intensity exercise).  Do strengthening exercises at least twice a week. This is in addition to the moderate-intensity exercise.  Spend less time sitting. Even light physical activity can be beneficial. Watch cholesterol and blood lipids Have your blood tested for lipids and cholesterol at 66 years of age, then have this  test every 5 years. You may need to have your cholesterol levels checked more often if:  Your lipid or cholesterol levels are high.  You are older than 66 years of age.  You are at high risk for heart disease. What should I know about cancer screening? Many types of cancers can be detected early and may often be prevented. Depending on your health history and family history, you may need to have cancer screening at various ages. This may include screening for:  Colorectal cancer.  Prostate cancer.  Skin cancer.  Lung cancer. What should I know about heart disease, diabetes, and high blood pressure? Blood pressure and heart disease  High blood pressure causes heart disease and increases the risk of stroke. This is more likely to develop in people who have high blood pressure readings, are of African descent, or are overweight.  Talk with your health care provider about your target blood pressure readings.  Have your blood pressure checked: ? Every 3-5 years if you are 65-72 years of age. ? Every year if you are 40 years old or older.  If you are between the ages of 15 and 64 and are a current or former smoker, ask your health care provider if you should have a one-time screening for abdominal aortic aneurysm (AAA). Diabetes Have regular diabetes screenings. This checks your fasting blood sugar level. Have the screening done:  Once every three years after age 69 if you are at a normal weight and have a low risk for diabetes.  More often and at a younger age if you are overweight or have a high risk for diabetes. What should I know about preventing infection? Hepatitis B If you have a higher risk for hepatitis B, you should be screened for this virus. Talk with your health care provider to find out if you are at risk for hepatitis B infection. Hepatitis C Blood testing is recommended for:  Everyone born from [birth year] through 1963.  Anyone with known risk factors for hepatitis C.  Sexually transmitted infections (STIs)  You should be screened each year for STIs, including gonorrhea and chlamydia, if: ? You are sexually active and are younger than 66 years of age. ? You are older than 65 years of age and your health care provider tells you that you are at risk for this type of infection. ? Your sexual activity has changed since you were last screened, and you are at increased risk for chlamydia or gonorrhea. Ask your health care  provider if you are at risk.  Ask your health care provider about whether you are at high risk for HIV. Your health care provider may recommend a prescription medicine to help prevent HIV infection. If you choose to take medicine to prevent HIV, you should first get tested for HIV. You should then be tested every 3 months for as long as you are taking the medicine. Follow these instructions at home: Lifestyle  Do not use any products that contain nicotine or tobacco, such as cigarettes, e-cigarettes, and chewing tobacco. If you need help quitting, ask your health care provider.  Do not use street drugs.  Do not share needles.  Ask your health care provider for help if you need support or information about quitting drugs. Alcohol use  Do not drink alcohol if your health care provider tells you not to drink.  If you drink alcohol: ? Limit how much you have to 0-2 drinks a day. ? Be aware of how much alcohol is in your drink. In the [place] one drink equals one 12 oz bottle of beer (355 mL), one 5 oz glass of wine (148 mL), or one 1 oz glass of hard liquor (44 mL). General instructions  Schedule regular health, dental, and eye exams.  Stay current with your vaccines.  Tell your health care provider if: ? You often feel depressed. ? You have ever been abused or do not feel safe at home. Summary  Adopting a healthy lifestyle and getting preventive care are important in promoting health and wellness.  Follow your health care provider's  instructions about healthy diet, exercising, and getting tested or screened for diseases.  Follow your health care provider's instructions on monitoring your cholesterol and blood pressure. This information is not intended to replace advice given to you by your health care provider. Make sure you discuss any questions you have with your health care provider. Document Released: 03/18/2008 Document Revised: 09/13/2018 Document Reviewed: 09/13/2018 Elsevier Patient Education  2020 [employer].

## 2019-06-12 NOTE — Progress Notes (Signed)
Internal Medicine Clinic Attending  I reviewed the AWV findings.  I agree with the assessment, diagnosis, and plan of care documented in the AWV note.     

## 2019-06-15 ENCOUNTER — Encounter (HOSPITAL_BASED_OUTPATIENT_CLINIC_OR_DEPARTMENT_OTHER): Payer: Self-pay | Admitting: Internal Medicine

## 2019-06-21 ENCOUNTER — Encounter: Payer: Self-pay | Admitting: Urology

## 2019-06-21 ENCOUNTER — Other Ambulatory Visit: Payer: Self-pay

## 2019-06-21 ENCOUNTER — Ambulatory Visit
Admission: RE | Admit: 2019-06-21 | Discharge: 2019-06-21 | Disposition: A | Discharge status: Home / Self Care | Payer: Medicare Other | Source: Ambulatory Visit | Attending: Radiation Oncology | Admitting: Radiation Oncology

## 2019-06-21 DIAGNOSIS — C7951 Secondary malignant neoplasm of bone: Secondary | ICD-10-CM

## 2019-06-21 DIAGNOSIS — Z510 Encounter for antineoplastic radiation therapy: Secondary | ICD-10-CM | POA: Insufficient documentation

## 2019-06-21 DIAGNOSIS — C7952 Secondary malignant neoplasm of bone marrow: Secondary | ICD-10-CM

## 2019-06-21 DIAGNOSIS — C61 Malignant neoplasm of prostate: Secondary | ICD-10-CM | POA: Insufficient documentation

## 2019-06-21 NOTE — Progress Notes (Signed)
Radiation Oncology         [phone] ________________________________  Name: AMILLION HELDERMAN MRN: [id number]  Date: 06/21/2019  DOB: Aug 28, 1951  Post Treatment Note  CC: Harvie Heck, MD  Wyatt Portela, MD  Diagnosis:   68 y.o. gentleman with oligometastatic disease in the sacrumfor stage IV oligometastaticadenocarcinoma of the prostate.    Interval Since Last Radiation:  5 weeks  05/09/19, 05/11/19, 05/14/19, 05/16/19, 05/18/19:  The oligometastatic disease in the sacrum was treated to 40 Gy in 5 fractions of 8 Gy each.  09/18/2014 - 11/15/2014 (Dr. Valere Dross):  1. Prostate / 78 Gy in 40 fractions 2. Seminal vesicles and pelvic lymph nodes / 56 Gy in 40 fractions  Narrative:  I spoke with the patient to conduct his routine scheduled 1 month follow up visit via telephone to spare the patient unnecessary potential exposure in the healthcare setting during the current COVID-19 pandemic.  The patient was notified in advance and gave permission to proceed with this visit format. He tolerated radiation treatment relatively well. He denied pain but continued with stiffness on his right side low back, when waking up each morning, that improved throughout the day, chronic and not progressive. He denied numbness or tingling of lower extremities or bowel or bladder complaints. He reported urinary frequency related to large intake of water daily, unchanged with treatments. 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 He received his last LUPRON injection in June under the care of Dr. Lovena Neighbours and has been receiving Lupron every three months since restarting ADT on 11/21/18.                               On review of systems, the patient states that he is doing quite well overall.  He denies any residual adverse side effects associated with his radiation treatment.  He is not currently having any pain and reports that his  energy level is gradually improving.  He has continued to tolerate the Lupron ADT quite well.  He denies any recent fever, chills, night sweats or unintended weight loss.  He reports a healthy appetite and is maintaining his weight.  ALLERGIES:  has No Known Allergies.  Meds: Current Outpatient Medications  Medication Sig Dispense Refill  . BETIMOL 0.5 % ophthalmic solution INT 1 GTT IN OD BID  5  . brimonidine (ALPHAGAN) 0.2 % ophthalmic solution INT 1 GTT INTO THE RIGHT EYE BID  0  . cholecalciferol (VITAMIN D3) 10 MCG (400 UNIT) TABS tablet Take 10,000 Units by mouth daily.    . dorzolamide (TRUSOPT) 2 % ophthalmic solution INT 1 GTT IN OD BID    . ferrous sulfate 325 (65 FE) MG tablet Take 1 tablet (325 mg total) by mouth daily with breakfast. 90 tablet 3  . timolol (TIMOPTIC) 0.5 % ophthalmic solution PLACE ONE DROP 2 TIMES INTO THE RIGHT EYE UTD  7  . triamcinolone (KENALOG) 0.025 % ointment Apply 1 application topically 2 (two) times daily. (Patient not taking: Reported on 06/21/2019) 80 g 2   No current facility-administered medications for this encounter.     Physical Findings:  vitals were not taken for this visit.   /Unable to assess due to telephone follow up visit format.   Lab Findings: Lab Results  Component Value Date   WBC 3.4 04/10/2019   HGB 10.2 (L) 04/10/2019  HCT 32.8 (L) 04/10/2019   MCV 88 04/10/2019   PLT 309 04/10/2019     Radiographic Findings: No results found.  Impression/Plan: 55. 68 y.o. gentleman with oligometastatic disease in the sacrumfor stage IV oligometastaticadenocarcinoma of the prostate.   He appears to have recovered well from the effects of his recent radiotherapy.  He is currently without complaints.  We discussed that while we are happy to continue to participate in his care if clinically indicated, at this point, we will plan to see him back on an as-needed basis.  He will continue in routine follow-up under the care and direction  of Dr. Lovena Neighbours for further management of his systemic disease with LT-ADT.  He will be due for repeat Lupron injection this month since his last 3 month injection was in 03/2019.  He knows to call at anytime with any questions or concerns related to his radiotherapy.    Nicholos Johns, PA-C

## 2019-07-12 DIAGNOSIS — C7951 Secondary malignant neoplasm of bone: Secondary | ICD-10-CM | POA: Diagnosis not present

## 2019-07-23 ENCOUNTER — Ambulatory Visit (INDEPENDENT_AMBULATORY_CARE_PROVIDER_SITE_OTHER): Payer: Medicare Other | Admitting: Podiatry

## 2019-07-23 ENCOUNTER — Other Ambulatory Visit: Payer: Self-pay

## 2019-07-23 DIAGNOSIS — E0842 Diabetes mellitus due to underlying condition with diabetic polyneuropathy: Secondary | ICD-10-CM | POA: Diagnosis not present

## 2019-07-23 DIAGNOSIS — L989 Disorder of the skin and subcutaneous tissue, unspecified: Secondary | ICD-10-CM

## 2019-07-23 DIAGNOSIS — M79676 Pain in unspecified toe(s): Secondary | ICD-10-CM | POA: Diagnosis not present

## 2019-07-23 DIAGNOSIS — B351 Tinea unguium: Secondary | ICD-10-CM

## 2019-07-26 NOTE — Progress Notes (Signed)
    Subjective: Patient is a 68 y.o. male presenting to the office today for follow up evaluation of painful callus lesions of the left foot. Wearing shoes and walking increases the pain. He has not done anything for treatment at home.   Patient also complains of elongated, thickened nails that cause pain while ambulating in shoes. He is unable to trim his own nails. Patient presents today for further treatment and evaluation.  Past Medical History:  Diagnosis Date  . Duodenitis   . Essential hypertension 07/16/2015  . Glaucoma   . H/O: substance abuse ([place])   . History of frostbite   . History of prostate cancer 03/20/2014   Lost to f/u with [facility] in the past 2011 with elevated PSA >30 at that time.  Saw [facility] (Dr. Risa Grill) on 03/13/14. Cancer Noted 03/13/14 office visit. Gleason score 7. Plan CT Ab/pelvis with contrast, bone scan   . History of radiation therapy nov 2015 to feb 2016  . Iron deficiency anemia   . Peripheral neuropathy   . Prostate cancer ([place]) 03/05/14   Gleason 7, volume 45 gm  . Rash and nonspecific skin eruption 08/04/2017  . S/P radiation therapy 09/18/14-11/15/14   prostate/ 7800Gy/40sessions  . Thrombocytopenia (HCC)     Objective:  Physical Exam General: Alert and oriented x3 in no acute distress  Dermatology: Hyperkeratotic lesions present on the left foot x 3. Pain on palpation with a central nucleated core noted. Skin is warm, dry and supple bilateral lower extremities. Negative for open lesions or macerations. Nails are tender, long, thickened and dystrophic with subungual debris, consistent with onychomycosis, 1-5 bilateral. No signs of infection noted.  Vascular: Palpable pedal pulses bilaterally. No edema or erythema noted. Capillary refill within normal limits.  Neurological: Epicritic and protective threshold grossly intact bilaterally.   Musculoskeletal Exam: Pain on palpation at the keratotic lesions noted. Range of motion within  normal limits bilateral. Muscle strength 5/5 in all groups bilateral.  Assessment: 1. Onychodystrophic nails 1-5 bilateral with hyperkeratosis of nails.  2. Onychomycosis of nail due to dermatophyte bilateral 3. Pre-ulcerative callus lesions noted to the left foot   Plan of Care:  1. Patient evaluated. 2. Excisional debridement of keratoic lesions using a chisel blade was performed without incident.  3. Dressed with light dressing. 4. Mechanical debridement of nails 1-5 bilaterally performed using a nail nipper. Filed with dremel without incident.  5. Patient is to return to the clinic in 3 months.   Edrick Kins, DPM [facility]  Dr. Edrick Kins, [place]                                        [place], [place] [address]                Office [phone]  Fax [phone]

## 2019-08-03 DIAGNOSIS — H401111 Primary open-angle glaucoma, right eye, mild stage: Secondary | ICD-10-CM | POA: Diagnosis not present

## 2019-08-03 DIAGNOSIS — H2511 Age-related nuclear cataract, right eye: Secondary | ICD-10-CM | POA: Diagnosis not present

## 2019-08-03 DIAGNOSIS — H2512 Age-related nuclear cataract, left eye: Secondary | ICD-10-CM | POA: Diagnosis not present

## 2019-08-07 ENCOUNTER — Encounter: Payer: Self-pay | Admitting: Internal Medicine

## 2019-08-07 ENCOUNTER — Ambulatory Visit (INDEPENDENT_AMBULATORY_CARE_PROVIDER_SITE_OTHER): Payer: Medicare Other | Admitting: Internal Medicine

## 2019-08-07 ENCOUNTER — Other Ambulatory Visit: Payer: Self-pay

## 2019-08-07 DIAGNOSIS — I10 Essential (primary) hypertension: Secondary | ICD-10-CM

## 2019-08-07 DIAGNOSIS — H409 Unspecified glaucoma: Secondary | ICD-10-CM

## 2019-08-07 DIAGNOSIS — C7951 Secondary malignant neoplasm of bone: Secondary | ICD-10-CM

## 2019-08-07 DIAGNOSIS — D509 Iron deficiency anemia, unspecified: Secondary | ICD-10-CM

## 2019-08-07 DIAGNOSIS — H269 Unspecified cataract: Secondary | ICD-10-CM

## 2019-08-07 DIAGNOSIS — Z923 Personal history of irradiation: Secondary | ICD-10-CM

## 2019-08-07 DIAGNOSIS — C61 Malignant neoplasm of prostate: Secondary | ICD-10-CM | POA: Diagnosis not present

## 2019-08-07 DIAGNOSIS — Z79899 Other long term (current) drug therapy: Secondary | ICD-10-CM

## 2019-08-07 NOTE — Assessment & Plan Note (Addendum)
Patient continues to use eye drops. He reports that he saw his ophthalmologist in September and was noted to have cataracts in his right eye for which he is getting surgery on November 24th. He denies any acute changes in vision at this time and will continue to monitor.  - Continue eye drops - Cataract removal on 11/24

## 2019-08-07 NOTE — Assessment & Plan Note (Signed)
Patient with history of iron deficiency anemia on ferrous sulfate 325mg  qd. Patient reports attempting to donate plasma to help with his expenses but was deferred due to his low hematocrit levels x4. He was instructed to follow up with his physician. He continues to take his daily iron supplement and is eating iron-rich foods. I discussed with patient to hold off on platelet donation for now until he has completed chemotherapy and we have optimized his iron deficiency anemia. Patient is agreeable with the plan. If continues to be anemic, would consider switching to Nu-iron for better absorption.   - Continue ferrous sulfate 325mg  qd for now.

## 2019-08-07 NOTE — Assessment & Plan Note (Signed)
Patient has completed radiation and reports that he is continued on chemotherapy. He will receive his next Lupron shot this upcoming week and he reports that he was started on 4 pills by his oncologist but does not remember the name. Patient continues to be in good spirits and has a good support system with his friends and family.  -Continue to monitor and follow up with oncologist recommendations

## 2019-08-07 NOTE — Patient Instructions (Signed)
[name],  It was a pleasure seeing you in clinic today.  Today, we discussed your plasma donation. At this time, I would like you to hold off on donating plasma as you are undergoing chemo treatment and are iron deficient.  We will readdress this after your chemo treatment has completed.   Please let us know if you have any concerns.  Thank you!

## 2019-08-07 NOTE — Progress Notes (Signed)
   CC: iron deficiency anemia   HPI:  [name] is a 68 y.o. male with PMHx of HTN, iron deficiency anemia and oligometastatic prostate cancer to the sacrum currently undergoing chemotherapy. He presents for follow up of his iron deficiency anemia. Please see problem based charting for further assessment and plan.   Past Medical History:  Diagnosis Date  . Duodenitis   . Essential hypertension 07/16/2015  . Glaucoma   . H/O: substance abuse ([place])   . History of frostbite   . History of prostate cancer 03/20/2014   Lost to f/u with [facility] in the past 2011 with elevated PSA >30 at that time.  Saw [facility] (Dr. Risa Grill) on 03/13/14. Cancer Noted 03/13/14 office visit. Gleason score 7. Plan CT Ab/pelvis with contrast, bone scan   . History of radiation therapy nov 2015 to feb 2016  . Iron deficiency anemia   . Peripheral neuropathy   . Prostate cancer ([place]) 03/05/14   Gleason 7, volume 45 gm  . Rash and nonspecific skin eruption 08/04/2017  . S/P radiation therapy 09/18/14-11/15/14   prostate/ 7800Gy/40sessions  . Thrombocytopenia ([place])    Review of Systems:   Review of Systems  Constitutional: Positive for malaise/fatigue. Negative for chills, fever and weight loss.  Eyes: Negative for blurred vision, double vision and photophobia.  Respiratory: Negative for cough, hemoptysis and shortness of breath.   Cardiovascular: Negative for chest pain, palpitations and claudication.  Gastrointestinal: Negative for abdominal pain, diarrhea, nausea and vomiting.  Musculoskeletal: Negative for back pain, joint pain and myalgias.  Skin: Negative for rash.  Neurological: Negative for dizziness, tingling, focal weakness and headaches.     Physical Exam:  Vitals:   08/07/19 1534  BP: (!) 149/104  Pulse: 84  Temp: 98.1 F (36.7 C)  TempSrc: Oral  SpO2: 100%  Weight: 184 lb 11.2 oz (83.8 kg)  Height: 5\' 9"  (1.753 m)   Physical Exam Vitals signs reviewed.   Constitutional:      General: He is not in acute distress.    Appearance: Normal appearance. He is not ill-appearing or diaphoretic.  Cardiovascular:     Rate and Rhythm: Normal rate and regular rhythm.     Pulses: Normal pulses.     Heart sounds: Normal heart sounds. No murmur. No friction rub. No gallop.   Pulmonary:     Effort: Pulmonary effort is normal. No respiratory distress.     Breath sounds: Normal breath sounds. No wheezing or rales.  Abdominal:     General: Bowel sounds are normal. There is no distension.     Palpations: Abdomen is soft.     Tenderness: There is no abdominal tenderness.  Musculoskeletal: Normal range of motion.        General: No swelling or tenderness.  Skin:    General: Skin is warm and dry.     Capillary Refill: Capillary refill takes less than 2 seconds.  Neurological:     General: No focal deficit present.     Mental Status: He is alert and oriented to person, place, and time. Mental status is at baseline.      Assessment & Plan:   See Encounters Tab for problem based charting.  Patient seen with Dr. Lynnae January

## 2019-08-08 NOTE — Progress Notes (Signed)
Internal Medicine Clinic Attending  I saw and evaluated the patient.  I personally confirmed the key portions of the history and exam documented by Dr. Aslam and I reviewed pertinent patient test results.  The assessment, diagnosis, and plan were formulated together and I agree with the documentation in the resident's note.     

## 2019-08-14 DIAGNOSIS — C7951 Secondary malignant neoplasm of bone: Secondary | ICD-10-CM | POA: Diagnosis not present

## 2019-08-28 DIAGNOSIS — Z01818 Encounter for other preprocedural examination: Secondary | ICD-10-CM | POA: Diagnosis not present

## 2019-08-28 DIAGNOSIS — H2511 Age-related nuclear cataract, right eye: Secondary | ICD-10-CM | POA: Diagnosis not present

## 2019-08-28 DIAGNOSIS — H401111 Primary open-angle glaucoma, right eye, mild stage: Secondary | ICD-10-CM | POA: Diagnosis not present

## 2019-09-06 DIAGNOSIS — Z719 Counseling, unspecified: Secondary | ICD-10-CM | POA: Diagnosis not present

## 2019-09-06 DIAGNOSIS — H409 Unspecified glaucoma: Secondary | ICD-10-CM | POA: Diagnosis not present

## 2019-09-06 DIAGNOSIS — H401111 Primary open-angle glaucoma, right eye, mild stage: Secondary | ICD-10-CM | POA: Diagnosis not present

## 2019-09-06 DIAGNOSIS — H2511 Age-related nuclear cataract, right eye: Secondary | ICD-10-CM | POA: Diagnosis not present

## 2019-09-18 DIAGNOSIS — C7951 Secondary malignant neoplasm of bone: Secondary | ICD-10-CM | POA: Diagnosis not present

## 2019-09-24 ENCOUNTER — Other Ambulatory Visit: Payer: Self-pay

## 2019-09-24 ENCOUNTER — Ambulatory Visit (INDEPENDENT_AMBULATORY_CARE_PROVIDER_SITE_OTHER): Payer: Medicare Other | Admitting: Podiatry

## 2019-09-24 DIAGNOSIS — E0842 Diabetes mellitus due to underlying condition with diabetic polyneuropathy: Secondary | ICD-10-CM

## 2019-09-24 DIAGNOSIS — L989 Disorder of the skin and subcutaneous tissue, unspecified: Secondary | ICD-10-CM | POA: Diagnosis not present

## 2019-09-24 DIAGNOSIS — B351 Tinea unguium: Secondary | ICD-10-CM

## 2019-09-24 DIAGNOSIS — M79676 Pain in unspecified toe(s): Secondary | ICD-10-CM | POA: Diagnosis not present

## 2019-09-29 NOTE — Progress Notes (Signed)
    Subjective: Patient is a 68 y.o. male presenting to the office today for follow up evaluation of callus lesions of the left foot. Wearing shoes and walking increases the pain. He has not done anything for treatment at home.   Patient also complains of elongated, thickened nails that cause pain while ambulating in shoes. He is unable to trim his own nails. Patient presents today for further treatment and evaluation.  Past Medical History:  Diagnosis Date  . Duodenitis   . Essential hypertension 07/16/2015  . Glaucoma   . H/O: substance abuse ([place])   . History of frostbite   . History of prostate cancer 03/20/2014   Lost to f/u with [facility] in the past 2011 with elevated PSA >30 at that time.  Saw [facility] (Dr. Risa Grill) on 03/13/14. Cancer Noted 03/13/14 office visit. Gleason score 7. Plan CT Ab/pelvis with contrast, bone scan   . History of radiation therapy nov 2015 to feb 2016  . Iron deficiency anemia   . Peripheral neuropathy   . Prostate cancer ([place]) 03/05/14   Gleason 7, volume 45 gm  . Rash and nonspecific skin eruption 08/04/2017  . S/P radiation therapy 09/18/14-11/15/14   prostate/ 7800Gy/40sessions  . Thrombocytopenia (HCC)     Objective:  Physical Exam General: Alert and oriented x3 in no acute distress  Dermatology: Hyperkeratotic lesions present on the left foot x 3. Pain on palpation with a central nucleated core noted. Skin is warm, dry and supple bilateral lower extremities. Negative for open lesions or macerations. Nails are tender, long, thickened and dystrophic with subungual debris, consistent with onychomycosis, 1-5 bilateral. No signs of infection noted.  Vascular: Palpable pedal pulses bilaterally. No edema or erythema noted. Capillary refill within normal limits.  Neurological: Epicritic and protective threshold grossly intact bilaterally.   Musculoskeletal Exam: Pain on palpation at the keratotic lesions noted. Range of motion within normal  limits bilateral. Muscle strength 5/5 in all groups bilateral.  Assessment: 1. Onychodystrophic nails 1-5 bilateral with hyperkeratosis of nails.  2. Onychomycosis of nail due to dermatophyte bilateral 3. Pre-ulcerative callus lesions noted to the left foot   Plan of Care:  1. Patient evaluated. 2. Excisional debridement of keratoic lesions using a chisel blade was performed without incident.  3. Dressed with light dressing. 4. Mechanical debridement of nails 1-5 bilaterally performed using a nail nipper. Filed with dremel without incident.  5. Patient is to return to the clinic in 3 months.   Edrick Kins, DPM [facility]  Dr. Edrick Kins, [place]                                        [place], [place] [address]                Office [phone]  Fax [phone]

## 2019-10-08 DIAGNOSIS — C7951 Secondary malignant neoplasm of bone: Secondary | ICD-10-CM | POA: Diagnosis not present

## 2019-10-18 DIAGNOSIS — C7951 Secondary malignant neoplasm of bone: Secondary | ICD-10-CM | POA: Diagnosis not present

## 2019-11-06 ENCOUNTER — Encounter: Payer: Self-pay | Admitting: Internal Medicine

## 2019-11-06 ENCOUNTER — Ambulatory Visit (INDEPENDENT_AMBULATORY_CARE_PROVIDER_SITE_OTHER): Payer: Medicare Other | Admitting: Internal Medicine

## 2019-11-06 ENCOUNTER — Other Ambulatory Visit: Payer: Self-pay

## 2019-11-06 VITALS — BP 175/100 | HR 62 | Temp 97.9°F | Ht 69.0 in | Wt 188.9 lb

## 2019-11-06 DIAGNOSIS — Z9849 Cataract extraction status, unspecified eye: Secondary | ICD-10-CM

## 2019-11-06 DIAGNOSIS — Z79899 Other long term (current) drug therapy: Secondary | ICD-10-CM

## 2019-11-06 DIAGNOSIS — C61 Malignant neoplasm of prostate: Secondary | ICD-10-CM | POA: Diagnosis not present

## 2019-11-06 DIAGNOSIS — C7951 Secondary malignant neoplasm of bone: Secondary | ICD-10-CM

## 2019-11-06 DIAGNOSIS — H409 Unspecified glaucoma: Secondary | ICD-10-CM

## 2019-11-06 DIAGNOSIS — D509 Iron deficiency anemia, unspecified: Secondary | ICD-10-CM | POA: Diagnosis not present

## 2019-11-06 DIAGNOSIS — M25569 Pain in unspecified knee: Secondary | ICD-10-CM | POA: Insufficient documentation

## 2019-11-06 DIAGNOSIS — Z923 Personal history of irradiation: Secondary | ICD-10-CM

## 2019-11-06 DIAGNOSIS — I10 Essential (primary) hypertension: Secondary | ICD-10-CM

## 2019-11-06 DIAGNOSIS — M25561 Pain in right knee: Secondary | ICD-10-CM

## 2019-11-06 LAB — GLUCOSE, CAPILLARY: Glucose-Capillary: 92 mg/dL (ref 70–99)

## 2019-11-06 LAB — POCT GLYCOSYLATED HEMOGLOBIN (HGB A1C): Hemoglobin A1C: 6.4 % — AB (ref 4.0–5.6)

## 2019-11-06 NOTE — Assessment & Plan Note (Signed)
Patient notes that vision is improved since cataract removal in November. He continues to use eye drops.   Plan: - Continue eye drops

## 2019-11-06 NOTE — Assessment & Plan Note (Addendum)
Patient continuing chemotherapy. He notes feeling well overall and has good energy. Gets lupron shots monthly. Next one scheduled for on the 17th. Notes that has appointment for blood work on the 20th.   Plan: Continue to monitor and f/u oncologist recommendations

## 2019-11-06 NOTE — Patient Instructions (Addendum)
William Jones,  You were seen today for BP follow up. At this time, please continue with your lifestyle changes with goal of weight loss with healthy diet and exercise.    Please follow up in 1-2 weeks for follow up on your BP. We may need to start you on a blood pressure medication.  Please contact us if you have any concerns.    Thank you!

## 2019-11-06 NOTE — Progress Notes (Signed)
   CC: hypertension f/u  HPI:  William Jones is a 69 y.o. male with PMHx of HTN, iron deficiency anemia, and prostate cancer s/p radiation and currently on chemotherapy presenting for follow up of his hypertension. Please see problem based charting for further assessment and plan.   Past Medical History:  Diagnosis Date  . Duodenitis   . Essential hypertension 07/16/2015  . Glaucoma   . H/O: substance abuse ([place])   . History of frostbite   . History of prostate cancer 03/20/2014   Lost to f/u with [facility] in the past 2011 with elevated PSA >30 at that time.  Saw [facility] (Dr. Risa Grill) on 03/13/14. Cancer Noted 03/13/14 office visit. Gleason score 7. Plan CT Ab/pelvis with contrast, bone scan   . History of radiation therapy nov 2015 to feb 2016  . Iron deficiency anemia   . Peripheral neuropathy   . Prostate cancer ([place]) 03/05/14   Gleason 7, volume 45 gm  . Rash and nonspecific skin eruption 08/04/2017  . S/P radiation therapy 09/18/14-11/15/14   prostate/ 7800Gy/40sessions  . Thrombocytopenia ([place])    Review of Systems:   Review of Systems  Constitutional: Negative for chills, fever and malaise/fatigue.  Eyes: Negative for blurred vision and pain.  Respiratory: Negative for cough and shortness of breath.   Cardiovascular: Negative for chest pain, palpitations and orthopnea.  Gastrointestinal: Negative for diarrhea, nausea and vomiting.  Musculoskeletal: Positive for joint pain. Negative for myalgias.       Right knee pain  Neurological: Negative for dizziness, sensory change, focal weakness, weakness and headaches.     Physical Exam:  Vitals:   11/06/19 1311  BP: (!) 185/109  Pulse: 62  Temp: 97.9 F (36.6 C)  TempSrc: Oral  SpO2: 100%  Weight: 188 lb 14.4 oz (85.7 kg)  Height: 5\' 9"  (1.753 m)   Physical Exam  Constitutional: He is oriented to person, place, and time and well-developed, well-nourished, and in no distress.  HENT:  Head:  Normocephalic and atraumatic.  Eyes: Conjunctivae and EOM are normal.  Cardiovascular: Normal rate, regular rhythm, normal heart sounds and intact distal pulses. Exam reveals no gallop and no friction rub.  No murmur heard. Pulmonary/Chest: Effort normal and breath sounds normal. No respiratory distress. He has no wheezes. He has no rales.  Abdominal: Soft. Bowel sounds are normal.  Musculoskeletal:        General: No tenderness or edema. Normal range of motion.     Cervical back: Normal range of motion.  Neurological: He is alert and oriented to person, place, and time. He has normal sensation, normal strength and normal reflexes. He exhibits normal muscle tone.  Skin: Skin is warm and dry.     Assessment & Plan:   See Encounters Tab for problem based charting.  Patient seen with Dr. Heber [place]

## 2019-11-06 NOTE — Assessment & Plan Note (Signed)
Patient notes right knee pain over the past week that is mostly present with activity and relieved with rest. He notes that it is more at the medial aspect of the knee and is using Bengay which is helping with the pain. On exam, no apparent deformity noted. Strength 5/5 in bilateral upper and lower extremities and sensation grossly intact.  Plan: - Continue to monitor - Bengay gel prn and ibuprofen prn

## 2019-11-06 NOTE — Assessment & Plan Note (Addendum)
BP 175/100 at this visit. He does note recent weight gain and believes it is contributing to his hypertension.  Patient continues to follow low sodium and heart healthy diet and has been increasing his exercise recently. He believes his HTN is secondary to his chemotherapy. Discussed with patient the risk of MI and stroke as well as long term effects of uncontrolled HTN. Patient expresses understanding. However, he would like to continue lifestyle modifications at this time. Discussed with patient that he will need closer monitoring of his BP and at next visit, will likely start him on anti-hypertensive if continues to have elevated BP. Patient expresses understanding.  Plan:  BMP today  Continue lifestyle modification F/u in 1-2 weeks for BP check  Start on HCTZ vs amlodipine at next visit

## 2019-11-07 LAB — BMP8+ANION GAP
Anion Gap: 13 mmol/L (ref 10.0–18.0)
BUN/Creatinine Ratio: 14 (ref 10–24)
BUN: 10 mg/dL (ref 8–27)
CO2: 23 mmol/L (ref 20–29)
Calcium: 9.9 mg/dL (ref 8.6–10.2)
Chloride: 103 mmol/L (ref 96–106)
Creatinine, Ser: 0.7 mg/dL — ABNORMAL LOW (ref 0.76–1.27)
GFR calc Af Amer: 114 mL/min/{1.73_m2} (ref >59)
GFR calc non Af Amer: 98 mL/min/{1.73_m2} (ref >59)
Glucose: 94 mg/dL (ref 65–99)
Potassium: 4.9 mmol/L (ref 3.5–5.2)
Sodium: 139 mmol/L (ref 134–144)

## 2019-11-11 NOTE — Progress Notes (Signed)
Internal Medicine Clinic Attending  I saw and evaluated the patient.  I personally confirmed the key portions of the history and exam documented by Dr. Marva Panda and I reviewed pertinent patient test results.  The assessment, diagnosis, and plan were formulated together and I agree with the documentation in the resident's note.    I confirmed patient is asymptomatic for his hypertension.  Rediscussed with patient the importance of HTN control.  Discussed close follow up.

## 2019-11-20 ENCOUNTER — Ambulatory Visit (INDEPENDENT_AMBULATORY_CARE_PROVIDER_SITE_OTHER): Payer: Medicare Other | Admitting: Internal Medicine

## 2019-11-20 ENCOUNTER — Encounter: Payer: Self-pay | Admitting: Internal Medicine

## 2019-11-20 ENCOUNTER — Other Ambulatory Visit: Payer: Self-pay

## 2019-11-20 VITALS — BP 176/107 | HR 70 | Temp 98.3°F | Ht 69.0 in | Wt 186.4 lb

## 2019-11-20 DIAGNOSIS — E785 Hyperlipidemia, unspecified: Secondary | ICD-10-CM

## 2019-11-20 DIAGNOSIS — Z79899 Other long term (current) drug therapy: Secondary | ICD-10-CM | POA: Diagnosis not present

## 2019-11-20 DIAGNOSIS — C61 Malignant neoplasm of prostate: Secondary | ICD-10-CM | POA: Diagnosis not present

## 2019-11-20 DIAGNOSIS — I10 Essential (primary) hypertension: Secondary | ICD-10-CM

## 2019-11-20 DIAGNOSIS — E782 Mixed hyperlipidemia: Secondary | ICD-10-CM

## 2019-11-20 DIAGNOSIS — C7951 Secondary malignant neoplasm of bone: Secondary | ICD-10-CM | POA: Diagnosis not present

## 2019-11-20 HISTORY — DX: Hyperlipidemia, unspecified: E78.5

## 2019-11-20 MED ORDER — HYDROCHLOROTHIAZIDE 25 MG PO TABS: 25.0000 mg | ORAL_TABLET | Freq: Every day | ORAL | 2 refills | Status: DC

## 2019-11-20 NOTE — Assessment & Plan Note (Addendum)
[name] recently followed up with his PCP, Dr. Marva Panda, regarding HTN. At the time, his BP was 175/100. [name] was hesitant regarding start medication and was instructed to follow up in 1-2 weeks for BP check.   Today, BP is 176/106. He continues to be asymptomatic, denies HA,dizziness, weakness. We reviewed his latest lab work, which did not show any kidney or electrolyte abnormality. [name] is amenable to starting medication today.  Plan: - HCTZ 25 mg QD  - Follow up in 4 weeks

## 2019-11-20 NOTE — Assessment & Plan Note (Addendum)
William Jones had a lipid panel done in September 2020 which showed:  Total cholesterol: 245 HDL: 52 LDL: 171  ASCVD risk calculated today and was 18.4%.   I discussed these lab results with William Jones today and reviewed some foods that may be causing his high cholesterol.  He notes to be eating shrimp 5 times per week and fried food on the weekends, both of which ar very high in cholesterol. Denies any red meat in his diet though.   We discussed some different options of treatment including statin.  William Jones would prefer lifestyle alteration over medication at this time, and I provided a couple PDFs for guidance.  Plan:  - Recheck lipid panel  - Follow up in 1 month - Consider moderate intensity statin if no improvement

## 2019-11-20 NOTE — Progress Notes (Signed)
   CC: BP check  HPI:  [name] is a 69 y.o. with a PMHx of uncontrolled HTN, prostate cancer with bone metasasis currently receiving chemotherapy and radiation who presents to the clinic for BP check.   Please see the Encounters tab for problem-based Assessment & Plan regarding status of patient's chronic conditions.  Past Medical History:  Diagnosis Date  . Duodenitis   . Essential hypertension 07/16/2015  . Glaucoma   . H/O: substance abuse ([place])   . History of frostbite   . History of prostate cancer 03/20/2014   Lost to f/u with [facility] in the past 2011 with elevated PSA >30 at that time.  Saw [facility] (Dr. Risa Grill) on 03/13/14. Cancer Noted 03/13/14 office visit. Gleason score 7. Plan CT Ab/pelvis with contrast, bone scan   . History of radiation therapy nov 2015 to feb 2016  . Iron deficiency anemia   . Peripheral neuropathy   . Prostate cancer ([place]) 03/05/14   Gleason 7, volume 45 gm  . Rash and nonspecific skin eruption 08/04/2017  . S/P radiation therapy 09/18/14-11/15/14   prostate/ 7800Gy/40sessions  . Thrombocytopenia ([place])    Review of Systems: Review of Systems  Constitutional: Negative for chills, fever and weight loss.  Cardiovascular: Negative for chest pain and palpitations.  Neurological: Negative for dizziness, weakness and headaches.   Physical Exam:  Vitals:   11/20/19 0906  BP: (!) 176/107  Pulse: 70  Temp: 98.3 F (36.8 C)  TempSrc: Oral  SpO2: 100%  Weight: 186 lb 6.4 oz (84.6 kg)  Height: 5\' 9"  (1.753 m)   Physical Exam Vitals and nursing note reviewed.  Constitutional:      General: He is not in acute distress.    Appearance: He is normal weight.  Cardiovascular:     Rate and Rhythm: Normal rate and regular rhythm.     Heart sounds: Normal heart sounds. No murmur. No gallop.   Pulmonary:     Effort: Pulmonary effort is normal. No respiratory distress.     Breath sounds: No wheezing.  Skin:    General: Skin  is warm and dry.  Neurological:     General: No focal deficit present.     Mental Status: He is alert and oriented to person, place, and time. Mental status is at baseline.     Motor: No weakness.     Gait: Gait normal.  Psychiatric:        Mood and Affect: Mood normal.        Behavior: Behavior normal.    Assessment & Plan:   See Encounters Tab for problem based charting.  Patient discussed with Dr. Heber [place]

## 2019-11-20 NOTE — Progress Notes (Signed)
Internal Medicine Clinic Attending  Case discussed with Dr. Basaraba at the time of the visit.  We reviewed the resident's history and exam and pertinent patient test results.  I agree with the assessment, diagnosis, and plan of care documented in the resident's note.    

## 2019-11-20 NOTE — Patient Instructions (Addendum)
It was nice seeing you today! Thank you for choosing [facility] for your Primary Care.    Today we talked about:   1. High blood pressure: We started you on a new medication called HCTZ. Please check your blood pressure at home or at your local pharmacy and keep a log.   2. High cholesterol: Work on decreasing shrimp and increasing salmon. We can recheck your levels and discuss medication in the future

## 2019-11-23 DIAGNOSIS — C7951 Secondary malignant neoplasm of bone: Secondary | ICD-10-CM | POA: Diagnosis not present

## 2019-11-28 ENCOUNTER — Other Ambulatory Visit: Payer: Self-pay

## 2019-11-28 ENCOUNTER — Ambulatory Visit (INDEPENDENT_AMBULATORY_CARE_PROVIDER_SITE_OTHER): Payer: Medicare Other | Admitting: Podiatry

## 2019-11-28 DIAGNOSIS — B351 Tinea unguium: Secondary | ICD-10-CM

## 2019-11-28 DIAGNOSIS — E0842 Diabetes mellitus due to underlying condition with diabetic polyneuropathy: Secondary | ICD-10-CM

## 2019-11-28 DIAGNOSIS — L989 Disorder of the skin and subcutaneous tissue, unspecified: Secondary | ICD-10-CM | POA: Diagnosis not present

## 2019-11-28 DIAGNOSIS — M79676 Pain in unspecified toe(s): Secondary | ICD-10-CM

## 2019-12-09 ENCOUNTER — Ambulatory Visit: Payer: Medicare Other | Attending: Internal Medicine

## 2019-12-09 DIAGNOSIS — Z23 Encounter for immunization: Secondary | ICD-10-CM

## 2019-12-09 NOTE — Progress Notes (Signed)
   Covid-19 Vaccination Clinic  Name:  OHM STARLIPER    MRN: [id number] DOB: [birth year]  12/09/2019  William Jones was observed post Covid-19 immunization for 15 minutes without incident. He was provided with Vaccine Information Sheet and instruction to access the V-Safe system.   [name] was instructed to call 911 with any severe reactions post vaccine: [name] Difficulty breathing  . Swelling of face and throat  . A fast heartbeat  . A bad rash all over body  . Dizziness and weakness   Immunizations Administered    Name Date Dose VIS Date Route   Pfizer COVID-19 Vaccine 12/09/2019  6:12 PM 0.3 mL 09/14/2019 Intramuscular   Manufacturer: [place]   Lot: [id number]   [place]: [id number]

## 2019-12-11 NOTE — Progress Notes (Signed)
    Subjective: Patient is a 69 y.o. male presenting to the office today for follow up evaluation of callus lesions of the left foot. Wearing shoes and walking increases the pain. He has not done anything for treatment at home.   Patient also complains of elongated, thickened nails that cause pain while ambulating in shoes. He is unable to trim his own nails. Patient presents today for further treatment and evaluation.  Past Medical History:  Diagnosis Date  . Duodenitis   . Essential hypertension 07/16/2015  . Glaucoma   . H/O: substance abuse ([place])   . History of frostbite   . History of prostate cancer 03/20/2014   Lost to f/u with [facility] in the past 2011 with elevated PSA >30 at that time.  Saw [facility] (Dr. Risa Grill) on 03/13/14. Cancer Noted 03/13/14 office visit. Gleason score 7. Plan CT Ab/pelvis with contrast, bone scan   . History of radiation therapy nov 2015 to feb 2016  . Hyperlipidemia 11/20/2019  . Iron deficiency anemia   . Peripheral neuropathy   . Prostate cancer ([place]) 03/05/14   Gleason 7, volume 45 gm  . Rash and nonspecific skin eruption 08/04/2017  . S/P radiation therapy 09/18/14-11/15/14   prostate/ 7800Gy/40sessions  . Thrombocytopenia ([place])     Objective:  Physical Exam General: Alert and oriented x3 in no acute distress  Dermatology: Hyperkeratotic lesions present on the left foot. Pain on palpation with a central nucleated core noted. Skin is warm, dry and supple bilateral lower extremities. Negative for open lesions or macerations. Nails are tender, long, thickened and dystrophic with subungual debris, consistent with onychomycosis, 1-5 bilateral. No signs of infection noted.  Vascular: Palpable pedal pulses bilaterally. No edema or erythema noted. Capillary refill within normal limits.  Neurological: Epicritic and protective threshold grossly intact bilaterally.   Musculoskeletal Exam: Pain on palpation at the keratotic lesions noted. Range  of motion within normal limits bilateral. Muscle strength 5/5 in all groups bilateral.  Assessment: 1. Onychodystrophic nails 1-5 bilateral with hyperkeratosis of nails.  2. Onychomycosis of nail due to dermatophyte bilateral 3. Pre-ulcerative callus lesions noted to the left foot x 2   Plan of Care:  1. Patient evaluated. 2. Excisional debridement of keratoic lesions using a chisel blade was performed without incident.  3. Dressed with light dressing. 4. Mechanical debridement of nails 1-5 bilaterally performed using a nail nipper. Filed with dremel without incident.  5. Patient is to return to the clinic in 3 months.   Edrick Kins, DPM [facility]  Dr. Edrick Kins, [address]                                        [place], [place] [address]                Office [phone]  Fax [phone]

## 2019-12-18 ENCOUNTER — Encounter: Payer: Self-pay | Admitting: Internal Medicine

## 2019-12-18 ENCOUNTER — Ambulatory Visit (INDEPENDENT_AMBULATORY_CARE_PROVIDER_SITE_OTHER): Payer: Medicare Other | Admitting: Internal Medicine

## 2019-12-18 ENCOUNTER — Other Ambulatory Visit: Payer: Self-pay

## 2019-12-18 VITALS — BP 148/96 | HR 88 | Temp 97.5°F | Ht 69.0 in | Wt 179.4 lb

## 2019-12-18 DIAGNOSIS — R011 Cardiac murmur, unspecified: Secondary | ICD-10-CM | POA: Diagnosis not present

## 2019-12-18 DIAGNOSIS — E785 Hyperlipidemia, unspecified: Secondary | ICD-10-CM | POA: Diagnosis not present

## 2019-12-18 DIAGNOSIS — I10 Essential (primary) hypertension: Secondary | ICD-10-CM | POA: Diagnosis not present

## 2019-12-18 DIAGNOSIS — R7303 Prediabetes: Secondary | ICD-10-CM

## 2019-12-18 DIAGNOSIS — E119 Type 2 diabetes mellitus without complications: Secondary | ICD-10-CM | POA: Insufficient documentation

## 2019-12-18 DIAGNOSIS — Z79899 Other long term (current) drug therapy: Secondary | ICD-10-CM | POA: Diagnosis not present

## 2019-12-18 DIAGNOSIS — E782 Mixed hyperlipidemia: Secondary | ICD-10-CM

## 2019-12-18 NOTE — Patient Instructions (Addendum)
You were seen for follow-up on your blood pressure.  Your blood pressure is significantly improved from the last time that we saw you.  Please continue with the dietary changes that you have started.  We will check some blood work today to check on your kidney function and electrolytes and call you with the results.  If possible, pleas measure your blood pressure at home and bring these values to your next appointment  Thank you for allowing [place] to be part of your medical care!

## 2019-12-18 NOTE — Progress Notes (Signed)
   CC: Blood pressure followup  HPI: Patient is a 69 year old male with past medical history as below who presents for follow-up on blood pressure.  William Jones is a 69 y.o.   Past Medical History:  Diagnosis Date  . Duodenitis   . Essential hypertension 07/16/2015  . Glaucoma   . H/O: substance abuse ([place])   . History of frostbite   . History of prostate cancer 03/20/2014   Lost to f/u with [facility] in the past 2011 with elevated PSA >30 at that time.  Saw [facility] (Dr. Risa Grill) on 03/13/14. Cancer Noted 03/13/14 office visit. Gleason score 7. Plan CT Ab/pelvis with contrast, bone scan   . History of radiation therapy nov 2015 to feb 2016  . Hyperlipidemia 11/20/2019  . Iron deficiency anemia   . Peripheral neuropathy   . Prostate cancer ([place]) 03/05/14   Gleason 7, volume 45 gm  . Rash and nonspecific skin eruption 08/04/2017  . S/P radiation therapy 09/18/14-11/15/14   prostate/ 7800Gy/40sessions  . Thrombocytopenia ([place])    Review of Systems:   Review of Systems  Respiratory: Negative for shortness of breath.   Cardiovascular: Negative for chest pain, orthopnea and PND.  All other systems reviewed and are negative.   Physical Exam:  Vitals:   12/18/19 0854 12/18/19 0922  BP: (!) 146/99 (!) 148/96  Pulse: 92 88  Temp: (!) 97.5 F (36.4 C)   TempSrc: Oral   SpO2: 100%   Weight: 179 lb 6.4 oz (81.4 kg)   Height: 5\' 9"  (1.753 m)    Physical Exam  Constitutional: He is well-developed, well-nourished, and in no distress.  HENT:  Head: Normocephalic and atraumatic.  Eyes: EOM are normal. Right eye exhibits no discharge. Left eye exhibits no discharge.  Neck: No tracheal deviation present.  Cardiovascular: Normal rate and regular rhythm. Exam reveals no gallop and no friction rub.  Murmur (2/6 systolic ejection murmur) heard. Pulmonary/Chest: Effort normal and breath sounds normal. No respiratory distress. He has no wheezes. He has no rales.    Abdominal: Soft. He exhibits no distension. There is no abdominal tenderness. There is no rebound and no guarding.  Musculoskeletal:        General: No tenderness, deformity or edema. Normal range of motion.     Cervical back: Normal range of motion.  Neurological: He is alert. Coordination normal.  Skin: Skin is warm and dry. No rash noted. He is not diaphoretic. No erythema.  Psychiatric: Memory and judgment normal.    Patient has consented for results of studies to be left on answering machine for patient number(s) listed in chart  Assessment & Plan:   See Encounters Tab for problem based charting.  Patient discussed with Dr. Rebeca Alert

## 2019-12-18 NOTE — Assessment & Plan Note (Addendum)
Patient presents for follow-up on blood pressure.  On review last encounter (11/20/2019), after 2 visits with systolics in [id number], patient was started on hydrochlorothiazide 25 mg daily.  Labs checked at that time revealed normal electrolytes and renal function.  Patient with blood pressure of 146/99, 148/96 on recheck.  Based on last lipid panel, patient with 10-year ASCVD greater than 10% - goal SBP less than 130.  Patient has lost approximately 6 pounds since last visit 1 month ago, and has implemented aggressive lifestyle interventions following dietary handouts provided at last encounter.  Patient is highly motivated and desires to continue with lifestyle interventions, refrain from additional antihypertensives at this time.  Plan: *Check BMP *Referral for dietary counseling *Continue HCTZ 25 mg daily *Counseled on checking blood pressure at home *Followup in 4 weeks for reassessment

## 2019-12-18 NOTE — Assessment & Plan Note (Signed)
Hemoglobin A1c in beginning of February of 6.4.  Patient desires to avoid medications at this time, this value is likely to improve with patient's dietary changes.  Plan: Referral for dietary counseling has been placed

## 2019-12-18 NOTE — Assessment & Plan Note (Signed)
Patient continues with dietary changes, and has had 6 pounds weight loss since last visit.  Patient desires to avoid additional medication at this time.  Plan: Recommend repeat lipid panel at the earliest of 6 to 12 months from past lipid panel

## 2019-12-18 NOTE — Assessment & Plan Note (Signed)
Patient with 2/6 systolic ejection murmur. No prior echocardiogram in system. Murmur is asymptomatic.   Plan: Echocardiogram ordered

## 2019-12-19 LAB — BMP8+ANION GAP
Anion Gap: 17 mmol/L (ref 10.0–18.0)
BUN/Creatinine Ratio: 27 — ABNORMAL HIGH (ref 10–24)
BUN: 24 mg/dL (ref 8–27)
CO2: 21 mmol/L (ref 20–29)
Calcium: 10 mg/dL (ref 8.6–10.2)
Chloride: 97 mmol/L (ref 96–106)
Creatinine, Ser: 0.9 mg/dL (ref 0.76–1.27)
GFR calc Af Amer: 103 mL/min/{1.73_m2} (ref >59)
GFR calc non Af Amer: 89 mL/min/{1.73_m2} (ref >59)
Glucose: 124 mg/dL — ABNORMAL HIGH (ref 65–99)
Potassium: 4 mmol/L (ref 3.5–5.2)
Sodium: 135 mmol/L (ref 134–144)

## 2019-12-20 ENCOUNTER — Telehealth: Payer: Self-pay | Admitting: Dietician

## 2019-12-20 NOTE — Telephone Encounter (Signed)
Called [name] per referral for Medical Nutrition Therapy for his prediabetes and hypertension. He does not have a computer, so cannot attend virtual Medicare prediabetes program.  We spent 15 minutes discussing his diet questions and how he was eating to prevent diabetes and lower his blood pressure. I encouraged eating mostly whole grains, vegetables, lean meats, fish, nuts, seed and fruits.  He agreed to have me mail him written information and my contact information for additional questions.

## 2019-12-21 ENCOUNTER — Other Ambulatory Visit (HOSPITAL_COMMUNITY): Payer: Medicare Other

## 2019-12-21 DIAGNOSIS — C7951 Secondary malignant neoplasm of bone: Secondary | ICD-10-CM | POA: Diagnosis not present

## 2019-12-24 ENCOUNTER — Ambulatory Visit (HOSPITAL_COMMUNITY): Payer: Medicare Other

## 2019-12-28 ENCOUNTER — Ambulatory Visit (HOSPITAL_COMMUNITY): Admission: RE | Admit: 2019-12-28 | Payer: Medicare Other | Source: Ambulatory Visit

## 2020-01-03 ENCOUNTER — Other Ambulatory Visit: Payer: Self-pay | Admitting: Internal Medicine

## 2020-01-08 NOTE — Progress Notes (Signed)
Internal Medicine Clinic Attending  Case discussed with Dr. MacLean at the time of the visit.  We reviewed the resident's history and exam and pertinent patient test results.  I agree with the assessment, diagnosis, and plan of care documented in the resident's note.  Shontay Wallner, M.D., Ph.D.  

## 2020-01-09 ENCOUNTER — Ambulatory Visit: Payer: Medicare Other | Attending: Internal Medicine

## 2020-01-09 DIAGNOSIS — Z23 Encounter for immunization: Secondary | ICD-10-CM

## 2020-01-09 NOTE — Progress Notes (Signed)
   Covid-19 Vaccination Clinic  Name:  William Jones    MRN: [id number] DOB: [birth year]  01/09/2020  William Jones was observed post Covid-19 immunization for 15 minutes without incident. He was provided with Vaccine Information Sheet and instruction to access the V-Safe system.   William Jones was instructed to call 911 with any severe reactions post vaccine: [name] Difficulty breathing  . Swelling of face and throat  . A fast heartbeat  . A bad rash all over body  . Dizziness and weakness   Immunizations Administered    Name Date Dose VIS Date Route   Pfizer COVID-19 Vaccine 01/09/2020  9:43 AM 0.3 mL 09/14/2019 Intramuscular   Manufacturer: [place]   Lot: [id number]   [place]: [id number]

## 2020-01-15 ENCOUNTER — Ambulatory Visit: Payer: Medicare Other

## 2020-01-21 ENCOUNTER — Ambulatory Visit (INDEPENDENT_AMBULATORY_CARE_PROVIDER_SITE_OTHER): Payer: Medicare Other | Admitting: Internal Medicine

## 2020-01-21 ENCOUNTER — Other Ambulatory Visit: Payer: Self-pay

## 2020-01-21 ENCOUNTER — Encounter: Payer: Self-pay | Admitting: Internal Medicine

## 2020-01-21 VITALS — BP 179/108 | HR 66 | Temp 97.9°F | Ht 69.0 in | Wt 183.7 lb

## 2020-01-21 DIAGNOSIS — I10 Essential (primary) hypertension: Secondary | ICD-10-CM | POA: Diagnosis not present

## 2020-01-21 DIAGNOSIS — Z79899 Other long term (current) drug therapy: Secondary | ICD-10-CM

## 2020-01-21 DIAGNOSIS — R011 Cardiac murmur, unspecified: Secondary | ICD-10-CM | POA: Diagnosis not present

## 2020-01-21 NOTE — Patient Instructions (Addendum)
[name],  It was nice meeting you! Today we talked about your....  Blood pressure - Your blood pressure remains elevated despite treatment with Hydrochlorothiazide (HCTZ). Please continue to work on a low-salt diet (called the DASH eating plan) to help lower the blood pressure. Follow-up in clinic in 4-6 weeks to recheck the blood pressure. It is possible you may need to be restarted on amlodipine to better control the blood pressure and prevent damage to your body including heart, brain, kidneys, and eyes.  Heart murmur - On exam today, you have a faint heart murmur. Please call to reschedule an ultrasound of your heart (called an echocardiogram) to look at the function, valves, and structure of the heart.  Thank you for letting us be a part of your care!   DASH Eating Plan DASH stands for "Dietary Approaches to Stop Hypertension." The DASH eating plan is a healthy eating plan that has been shown to reduce high blood pressure (hypertension). It may also reduce your risk for type 2 diabetes, heart disease, and stroke. The DASH eating plan may also help with weight loss. What are tips for following this plan?  General guidelines  Avoid eating more than 2,300 mg (milligrams) of salt (sodium) a day. If you have hypertension, you may need to reduce your sodium intake to 1,500 mg a day.  Limit alcohol intake to no more than 1 drink a day for nonpregnant women and 2 drinks a day for men. One drink equals 12 oz of beer, 5 oz of wine, or 1 oz of hard liquor.  Work with your health care provider to maintain a healthy body weight or to lose weight. Ask what an ideal weight is for you.  Get at least 30 minutes of exercise that causes your heart to beat faster (aerobic exercise) most days of the week. Activities may include walking, swimming, or biking.  Work with your health care provider or diet and nutrition specialist (dietitian) to adjust your eating plan to your individual calorie  needs. Reading food labels   Check food labels for the amount of sodium per serving. Choose foods with less than 5 percent of the Daily Value of sodium. Generally, foods with less than 300 mg of sodium per serving fit into this eating plan.  To find whole grains, look for the word "whole" as the first word in the ingredient list. Shopping  Buy products labeled as "low-sodium" or "no salt added."  Buy fresh foods. Avoid canned foods and premade or frozen meals. Cooking  Avoid adding salt when cooking. Use salt-free seasonings or herbs instead of table salt or sea salt. Check with your health care provider or pharmacist before using salt substitutes.  Do not fry foods. Cook foods using healthy methods such as baking, boiling, grilling, and broiling instead.  Cook with heart-healthy oils, such as olive, canola, soybean, or sunflower oil. Meal planning  Eat a balanced diet that includes: ? 5 or more servings of fruits and vegetables each day. At each meal, try to fill half of your plate with fruits and vegetables. ? Up to 6-8 servings of whole grains each day. ? Less than 6 oz of lean meat, poultry, or fish each day. A 3-oz serving of meat is about the same size as a deck of cards. One egg equals 1 oz. ? 2 servings of low-fat dairy each day. ? A serving of nuts, seeds, or beans 5 times each week. ? Heart-healthy fats. Healthy fats called Omega-3 fatty acids  are found in foods such as flaxseeds and coldwater fish, like sardines, salmon, and mackerel.  Limit how much you eat of the following: ? Canned or prepackaged foods. ? Food that is high in trans fat, such as fried foods. ? Food that is high in saturated fat, such as fatty meat. ? Sweets, desserts, sugary drinks, and other foods with added sugar. ? Full-fat dairy products.  Do not salt foods before eating.  Try to eat at least 2 vegetarian meals each week.  Eat more home-cooked food and less restaurant, buffet, and fast  food.  When eating at a restaurant, ask that your food be prepared with less salt or no salt, if possible. What foods are recommended? The items listed may not be a complete list. Talk with your dietitian about what dietary choices are best for you. Grains Whole-grain or whole-wheat bread. Whole-grain or whole-wheat pasta. Brown rice. [place]. Bulgur. Whole-grain and low-sodium cereals. Pita bread. Low-fat, low-sodium crackers. Whole-wheat flour tortillas. Vegetables Fresh or frozen vegetables (raw, steamed, roasted, or grilled). Low-sodium or reduced-sodium tomato and vegetable juice. Low-sodium or reduced-sodium tomato sauce and tomato paste. Low-sodium or reduced-sodium canned vegetables. Fruits All fresh, dried, or frozen fruit. Canned fruit in natural juice (without added sugar). Meat and other protein foods Skinless chicken or [place]. Ground chicken or [place]. Pork with fat trimmed off. Fish and seafood. Egg whites. Dried beans, peas, or lentils. Unsalted nuts, nut butters, and seeds. Unsalted canned beans. Lean cuts of beef with fat trimmed off. Low-sodium, lean deli meat. Dairy Low-fat (1%) or fat-free (skim) milk. Fat-free, low-fat, or reduced-fat cheeses. Nonfat, low-sodium ricotta or cottage cheese. Low-fat or nonfat yogurt. Low-fat, low-sodium cheese. Fats and oils Soft margarine without trans fats. Vegetable oil. Low-fat, reduced-fat, or light mayonnaise and salad dressings (reduced-sodium). Canola, safflower, olive, soybean, and sunflower oils. Avocado. Seasoning and other foods Herbs. Spices. Seasoning mixes without salt. Unsalted popcorn and pretzels. Fat-free sweets. What foods are not recommended? The items listed may not be a complete list. Talk with your dietitian about what dietary choices are best for you. Grains Baked goods made with fat, such as croissants, muffins, or some breads. Dry pasta or rice meal packs. Vegetables Creamed or fried vegetables. Vegetables  in a cheese sauce. Regular canned vegetables (not low-sodium or reduced-sodium). Regular canned tomato sauce and paste (not low-sodium or reduced-sodium). Regular tomato and vegetable juice (not low-sodium or reduced-sodium). Angie Fava. Olives. Fruits Canned fruit in a light or heavy syrup. Fried fruit. Fruit in cream or butter sauce. Meat and other protein foods Fatty cuts of meat. Ribs. Fried meat. Berniece Salines. Sausage. Bologna and other processed lunch meats. Salami. Fatback. Hotdogs. Bratwurst. Salted nuts and seeds. Canned beans with added salt. Canned or smoked fish. Whole eggs or egg yolks. Chicken or [place] with skin. Dairy Whole or 2% milk, cream, and half-and-half. Whole or full-fat cream cheese. Whole-fat or sweetened yogurt. Full-fat cheese. Nondairy creamers. Whipped toppings. Processed cheese and cheese spreads. Fats and oils Butter. Stick margarine. Lard. Shortening. Ghee. Bacon fat. Tropical oils, such as coconut, palm kernel, or palm oil. Seasoning and other foods Salted popcorn and pretzels. Onion salt, garlic salt, seasoned salt, table salt, and sea salt. Worcestershire sauce. Tartar sauce. Barbecue sauce. Teriyaki sauce. Soy sauce, including reduced-sodium. Steak sauce. Canned and packaged gravies. Fish sauce. Oyster sauce. Cocktail sauce. Horseradish that you find on the shelf. Ketchup. Mustard. Meat flavorings and tenderizers. Bouillon cubes. Hot sauce and Tabasco sauce. Premade or packaged marinades. Premade or packaged taco  seasonings. Relishes. Regular salad dressings. Where to find more information:  National Heart, Lung, and [place]: https://wilson-eaton.com/  American Heart Association: www.heart.org Summary  The DASH eating plan is a healthy eating plan that has been shown to reduce high blood pressure (hypertension). It may also reduce your risk for type 2 diabetes, heart disease, and stroke.  With the DASH eating plan, you should limit salt (sodium) intake to 2,300 mg a  day. If you have hypertension, you may need to reduce your sodium intake to 1,500 mg a day.  When on the DASH eating plan, aim to eat more fresh fruits and vegetables, whole grains, lean proteins, low-fat dairy, and heart-healthy fats.  Work with your health care provider or diet and nutrition specialist (dietitian) to adjust your eating plan to your individual calorie needs. This information is not intended to replace advice given to you by your health care provider. Make sure you discuss any questions you have with your health care provider. Document Revised: 09/02/2017 Document Reviewed: 09/13/2016 Elsevier Patient Education  2020 [employer].

## 2020-01-21 NOTE — Assessment & Plan Note (Signed)
Pt presents today for BP follow-up. Endorses taking HCTZ 25mg  daily. Denies medication side effects, chest pain, shortness of breath, headaches, or vision changes. Pt states he occasionally (2-3 times a month) checks his blood pressure at the pharmacy. Says most recent readings have been SBP between 140-150. Today in the clinic, BP remains uncontrolled at 161/102 and 179/108on repeat.  Had a long discussion with pt regarding hypertension and why blood pressure control is important. He states he understands "it can cause strokes and heart attacks." I reiterated it's effects on kidneys, eyesight, and overall vasculature as well, and recommendation for the addition of amlodipine 5mg  given pt tolerated it previously with good BP control in 2017. Pt is adamant about not making any medication changes at this time - defers addition of amlodipine or increase in HCTZ dose. Pt is concerned over the number of medications he is already taking and is not interested in more. Would like to focus on diet changes.  Assessment - asymptomatic severe uncontrolled hypertension  - encouraged to implement low salt diet - pt provided DASH diet handout with AVS paperwork - reiterated to pt diet and lifestyle modifications alone might not be adequate to control his blood pressure - follow-up with PCP in 1 month - goal SBP < 130 - would recommend addition of amlodipine if BP elevated at follow-up

## 2020-01-21 NOTE — Progress Notes (Signed)
   CC: hypertension follow-up  HPI:  [name] is a 69 y.o. M with significant PMH as outlined below who presents for blood pressure follow-up. Please see problem-based charting for additional information.  Past Medical History:  Diagnosis Date  . Duodenitis   . Essential hypertension 07/16/2015  . Glaucoma   . H/O: substance abuse ([place])   . History of frostbite   . History of prostate cancer 03/20/2014   Lost to f/u with [facility] in the past 2011 with elevated PSA >30 at that time.  Saw [facility] (Dr. Risa Grill) on 03/13/14. Cancer Noted 03/13/14 office visit. Gleason score 7. Plan CT Ab/pelvis with contrast, bone scan   . History of radiation therapy nov 2015 to feb 2016  . Hyperlipidemia 11/20/2019  . Iron deficiency anemia   . Peripheral neuropathy   . Prostate cancer ([place]) 03/05/14   Gleason 7, volume 45 gm  . Rash and nonspecific skin eruption 08/04/2017  . S/P radiation therapy 09/18/14-11/15/14   prostate/ 7800Gy/40sessions  . Thrombocytopenia ([place])    Review of Systems:   Review of Systems  Constitutional: Negative for chills and fever.  Respiratory: Negative for cough and shortness of breath.   Cardiovascular: Negative for chest pain, palpitations and leg swelling.  Gastrointestinal: Negative for abdominal pain, nausea and vomiting.  Genitourinary: Negative for dysuria and frequency.   Physical Exam:  Vitals:   01/21/20 0906  BP: (!) 161/102  Pulse: 70  Temp: 97.9 F (36.6 C)  TempSrc: Oral  SpO2: 99%  Weight: 183 lb 11.2 oz (83.3 kg)  Height: 5\' 9"  (1.753 m)   Physical Exam Vitals and nursing note reviewed.  Constitutional:      General: He is not in acute distress.    Appearance: Normal appearance. He is not ill-appearing.  Cardiovascular:     Rate and Rhythm: Normal rate and regular rhythm.     Heart sounds: Murmur present. Systolic murmur present with a grade of 1/6.  Pulmonary:     Effort: Pulmonary effort is normal.   Breath sounds: Normal breath sounds.  Abdominal:     General: Abdomen is flat. Bowel sounds are normal. There is no distension.     Palpations: Abdomen is soft.     Tenderness: There is no abdominal tenderness.  Skin:    General: Skin is warm and dry.  Neurological:     Mental Status: He is alert.    Assessment & Plan:   See Encounters Tab for problem based charting.  Patient discussed with Dr. Lynnae January

## 2020-01-21 NOTE — Assessment & Plan Note (Signed)
Pt with new heart murmur documented last month. He did not follow-up with an outpatient echo appointment as he did not know what it was for. Discussed the new heart murmur finding which is present, though faint, on today's exam. Educated pt on echo as an ultrasound of the heart to look for valvular abnormalities which can cause the heart murmur provider's are hearing. Pt agreeable to calling and rescheduling echocardiogram.   - instructed pt to call and reschedule echo appointment - given number for echo lab at [phone]

## 2020-01-22 NOTE — Progress Notes (Signed)
Internal Medicine Clinic Attending  Case discussed with Dr. Jones at the time of the visit.  We reviewed the resident's history and exam and pertinent patient test results.  I agree with the assessment, diagnosis, and plan of care documented in the resident's note.  

## 2020-01-24 DIAGNOSIS — C7951 Secondary malignant neoplasm of bone: Secondary | ICD-10-CM | POA: Diagnosis not present

## 2020-02-01 ENCOUNTER — Encounter: Payer: Medicare Other | Admitting: Dietician

## 2020-02-04 ENCOUNTER — Ambulatory Visit: Payer: Medicare Other | Admitting: Podiatry

## 2020-02-13 ENCOUNTER — Ambulatory Visit (INDEPENDENT_AMBULATORY_CARE_PROVIDER_SITE_OTHER): Payer: Medicare Other | Admitting: Podiatry

## 2020-02-13 ENCOUNTER — Other Ambulatory Visit: Payer: Self-pay

## 2020-02-13 DIAGNOSIS — E0842 Diabetes mellitus due to underlying condition with diabetic polyneuropathy: Secondary | ICD-10-CM | POA: Diagnosis not present

## 2020-02-13 DIAGNOSIS — M79676 Pain in unspecified toe(s): Secondary | ICD-10-CM

## 2020-02-13 DIAGNOSIS — L989 Disorder of the skin and subcutaneous tissue, unspecified: Secondary | ICD-10-CM | POA: Diagnosis not present

## 2020-02-13 DIAGNOSIS — B351 Tinea unguium: Secondary | ICD-10-CM

## 2020-02-17 NOTE — Progress Notes (Signed)
    Subjective: Patient is a 68 y.o. male presenting to the office today for follow up evaluation of callus lesions of the left foot. Wearing shoes and walking increases the pain. He has not done anything for treatment at home.   Patient also complains of elongated, thickened nails that cause pain while ambulating in shoes. He is unable to trim his own nails. Patient presents today for further treatment and evaluation.  Past Medical History:  Diagnosis Date  . Duodenitis   . Essential hypertension 07/16/2015  . Glaucoma   . H/O: substance abuse ([place])   . History of frostbite   . History of prostate cancer 03/20/2014   Lost to f/u with [facility] in the past 2011 with elevated PSA >30 at that time.  Saw [facility] (Dr. Risa Grill) on 03/13/14. Cancer Noted 03/13/14 office visit. Gleason score 7. Plan CT Ab/pelvis with contrast, bone scan   . History of radiation therapy nov 2015 to feb 2016  . Hyperlipidemia 11/20/2019  . Iron deficiency anemia   . Peripheral neuropathy   . Prostate cancer ([place]) 03/05/14   Gleason 7, volume 45 gm  . Rash and nonspecific skin eruption 08/04/2017  . S/P radiation therapy 09/18/14-11/15/14   prostate/ 7800Gy/40sessions  . Thrombocytopenia ([place])     Objective:  Physical Exam General: Alert and oriented x3 in no acute distress  Dermatology: Hyperkeratotic lesions present on the left foot. Pain on palpation with a central nucleated core noted. Skin is warm, dry and supple bilateral lower extremities. Negative for open lesions or macerations. Nails are tender, long, thickened and dystrophic with subungual debris, consistent with onychomycosis, 1-5 bilateral. No signs of infection noted.  Vascular: Palpable pedal pulses bilaterally. No edema or erythema noted. Capillary refill within normal limits.  Neurological: Epicritic and protective threshold grossly intact bilaterally.   Musculoskeletal Exam: Pain on palpation at the keratotic lesions noted. Range  of motion within normal limits bilateral. Muscle strength 5/5 in all groups bilateral.  Assessment: 1. Onychodystrophic nails 1-5 bilateral with hyperkeratosis of nails.  2. Onychomycosis of nail due to dermatophyte bilateral 3. Pre-ulcerative callus lesions noted to the left foot x 2   Plan of Care:  1. Patient evaluated. 2. Excisional debridement of keratoic lesions using a chisel blade was performed without incident.  3. Dressed with light dressing. 4. Mechanical debridement of nails 1-5 bilaterally performed using a nail nipper. Filed with dremel without incident.  5. Patient is to return to the clinic in 3 months.   Edrick Kins, DPM [facility]  Dr. Edrick Kins, [address]                                        [place], [place] [address]                Office [phone]  Fax [phone]

## 2020-02-19 ENCOUNTER — Other Ambulatory Visit: Payer: Self-pay | Admitting: *Deleted

## 2020-02-19 MED ORDER — HYDROCHLOROTHIAZIDE 25 MG PO TABS: 25.0000 mg | ORAL_TABLET | Freq: Every day | ORAL | 2 refills | Status: DC

## 2020-03-13 DIAGNOSIS — C7951 Secondary malignant neoplasm of bone: Secondary | ICD-10-CM | POA: Diagnosis not present

## 2020-04-16 ENCOUNTER — Ambulatory Visit (INDEPENDENT_AMBULATORY_CARE_PROVIDER_SITE_OTHER): Payer: Medicare Other | Admitting: Podiatry

## 2020-04-16 ENCOUNTER — Other Ambulatory Visit: Payer: Self-pay

## 2020-04-16 DIAGNOSIS — L989 Disorder of the skin and subcutaneous tissue, unspecified: Secondary | ICD-10-CM | POA: Diagnosis not present

## 2020-04-16 DIAGNOSIS — M79676 Pain in unspecified toe(s): Secondary | ICD-10-CM | POA: Diagnosis not present

## 2020-04-16 DIAGNOSIS — B351 Tinea unguium: Secondary | ICD-10-CM | POA: Diagnosis not present

## 2020-04-16 DIAGNOSIS — E0842 Diabetes mellitus due to underlying condition with diabetic polyneuropathy: Secondary | ICD-10-CM

## 2020-04-16 NOTE — Progress Notes (Signed)
    Subjective: °Patient is a 69 y.o. male presenting to the office today for follow up evaluation of callus lesions of the left foot. Wearing shoes and walking increases the pain. He has not done anything for treatment at home.   °Patient also complains of elongated, thickened nails that cause pain while ambulating in shoes. He is unable to trim his own nails. Patient presents today for further treatment and evaluation. ° °Past Medical History:  °Diagnosis Date  °• Duodenitis   °• Essential hypertension 07/16/2015  °• Glaucoma   °• H/O: substance abuse ([facility])   °• History of frostbite   °• History of prostate cancer 03/20/2014  ° Lost to f/u with [facility] in the past 2011 with elevated PSA >30 at that time.  Saw [facility] (Dr. Grapey) on 03/13/14. Cancer Noted 03/13/14 office visit. Gleason score 7. Plan CT Ab/pelvis with contrast, bone scan   °• History of radiation therapy nov 2015 to feb 2016  °• Hyperlipidemia 11/20/2019  °• Iron deficiency anemia   °• Peripheral neuropathy   °• Prostate cancer ([facility]) 03/05/14  ° Gleason 7, volume 45 gm  °• Rash and nonspecific skin eruption 08/04/2017  °• S/P radiation therapy 09/18/14-11/15/14  ° prostate/ 7800Gy/40sessions  °• Thrombocytopenia ([facility])   ° ° °Objective:  °Physical Exam °General: Alert and oriented x3 in no acute distress ° °Dermatology: Hyperkeratotic lesions present on the left foot. Pain on palpation with a central nucleated core noted. Skin is warm, dry and supple bilateral lower extremities. Negative for open lesions or macerations. Nails are tender, long, thickened and dystrophic with subungual debris, consistent with onychomycosis, 1-5 bilateral. No signs of infection noted. ° °Vascular: Palpable pedal pulses bilaterally. No edema or erythema noted. Capillary refill within normal limits. ° °Neurological: Epicritic and protective threshold grossly intact bilaterally.  ° °Musculoskeletal Exam: Pain on palpation at the keratotic lesions noted. Range  of motion within normal limits bilateral. Muscle strength 5/5 in all groups bilateral. ° °Assessment: °1. Onychodystrophic nails 1-5 bilateral with hyperkeratosis of nails.  °2. Onychomycosis of nail due to dermatophyte bilateral °3. Pre-ulcerative callus lesions noted to the left foot x 2 ° ° °Plan of Care:  °1. Patient evaluated. °2. Excisional debridement of keratoic lesions using a chisel blade was performed without incident.  °3. Dressed with light dressing. °4. Mechanical debridement of nails 1-5 bilaterally performed using a nail nipper. Filed with dremel without incident.  °5. Patient is to return to the clinic in 3 months.  ° °Georgios Kina M. Kresta Templeman, DPM °[facility] ° °Dr. Saivon Prowse M. Triston Lisanti, DPM  °  °[address]                                        °[place], [place] [address]                °Office [phone]  °Fax [phone] ° ° °

## 2020-05-16 ENCOUNTER — Other Ambulatory Visit: Payer: Self-pay | Admitting: Internal Medicine

## 2020-06-03 ENCOUNTER — Encounter: Payer: Self-pay | Admitting: Student

## 2020-06-03 ENCOUNTER — Other Ambulatory Visit: Payer: Self-pay

## 2020-06-03 ENCOUNTER — Ambulatory Visit (INDEPENDENT_AMBULATORY_CARE_PROVIDER_SITE_OTHER): Payer: Medicare Other | Admitting: Student

## 2020-06-03 VITALS — BP 129/98 | HR 72 | Temp 98.2°F | Ht 69.0 in | Wt 185.5 lb

## 2020-06-03 DIAGNOSIS — R7303 Prediabetes: Secondary | ICD-10-CM

## 2020-06-03 DIAGNOSIS — Z23 Encounter for immunization: Secondary | ICD-10-CM

## 2020-06-03 DIAGNOSIS — I10 Essential (primary) hypertension: Secondary | ICD-10-CM | POA: Diagnosis not present

## 2020-06-03 DIAGNOSIS — E782 Mixed hyperlipidemia: Secondary | ICD-10-CM

## 2020-06-03 LAB — POCT GLYCOSYLATED HEMOGLOBIN (HGB A1C): Hemoglobin A1C: 6.6 % — AB (ref 4.0–5.6)

## 2020-06-03 LAB — GLUCOSE, CAPILLARY: Glucose-Capillary: 133 mg/dL — ABNORMAL HIGH (ref 70–99)

## 2020-06-03 MED ORDER — AMLODIPINE BESYLATE 2.5 MG PO TABS: 2.5000 mg | ORAL_TABLET | Freq: Every day | ORAL | 11 refills | Status: DC

## 2020-06-03 NOTE — Assessment & Plan Note (Addendum)
Average blood pressure of 129/98 today. Patient has been compliant with his HCTZ 25 mg daily. He notes he continues to work on his diet as well. Congratulated patient and encouraged him to continue to work on his diet. Patient's pressure continues to be elevated, will add low dose amlodipine. Discussed with patient risk and benefits of starting new medicine and discussed with him potential side effects of lower extremity swelling. Patient agrees to start new medication.   Plan:  - Continue HCTZS 25 mg daily - Start Amlodipine 2.5 mg daily - Goal - consistent SBP < 130 - Patient instructed to DC medicine if side effects occur.

## 2020-06-03 NOTE — Patient Instructions (Signed)
Thank you for allowing [place] to assist in your care today!  Today we discussed:  Blood Pressure - Your blood pressure has improved, but it is still high. We will be adding amlodipine 2.5 mg. Please take it along with your HCTZ (small pink pill) - If you notice any side effects, please stop the amlodipine (new medication)   High cholesterol - We repeated your cholesterol today and will update you with the results  Prediabetic - We are rechecking your A1C (test to check for diabetes) today - We will update you with the results  Thank you and if you have any questions or concerns, please do not hesitate to reach out to our office

## 2020-06-03 NOTE — Assessment & Plan Note (Signed)
Hemoglobin A1C of 6.4 last visit, 6.6 today. Resulted after patient departed. Given patient's hesitancy to start new medications, will discuss starting metformin at next visit. Also, if patient has side effects from starting amlodipine will not know if from metformin or amlodipine.   Plan - Discuss starting metformin at next visit.

## 2020-06-03 NOTE — Assessment & Plan Note (Addendum)
Patient continues to work on diet, last LDL of 171. Repeated today, pending results will discuss starting patient on statin therapy.   Patient has been hesitant to start new medications in the past.  Plan - Lipid panel pending - Add statin if LDL greater than 70. Discuss at next visit.

## 2020-06-03 NOTE — Progress Notes (Signed)
   CC: Blood Pressure Follow Up  HPI:  William Jones is a 69 y.o. with a PMHx of prostate cancer with mets to bone currently being treated with chemotherapy, HTN, HLD, and prediabetes who presents for a follow up of his blood pressure. He states he is doing well on his current HCTZ. He notes since starting the medication he no longer has headaches. He is also continuing to work on a low sodium diet with olive oil and baked foods. He attempts to avoid fried foods and limits the amount of protein he consumes.   He has no other complaints at the time of my examination  Past Medical History:  Diagnosis Date  . Duodenitis   . Essential hypertension 07/16/2015  . Glaucoma   . H/O: substance abuse ([place])   . History of frostbite   . History of prostate cancer 03/20/2014   Lost to f/u with [facility] in the past 2011 with elevated PSA >30 at that time.  Saw [facility] (Dr. Risa Grill) on 03/13/14. Cancer Noted 03/13/14 office visit. Gleason score 7. Plan CT Ab/pelvis with contrast, bone scan   . History of radiation therapy nov 2015 to feb 2016  . Hyperlipidemia 11/20/2019  . Iron deficiency anemia   . Peripheral neuropathy   . Prostate cancer ([place]) 03/05/14   Gleason 7, volume 45 gm  . Rash and nonspecific skin eruption 08/04/2017  . S/P radiation therapy 09/18/14-11/15/14   prostate/ 7800Gy/40sessions  . Thrombocytopenia ([place])    Review of Systems:  As per HPI  Physical Exam:  Vitals:   06/03/20 0912 06/03/20 0947  BP: (!) 140/96 (!) 129/98  Pulse: 79 72  Temp: 98.2 F (36.8 C)   TempSrc: Oral   SpO2: 100%   Weight: 185 lb 8 oz (84.1 kg)   Height: 5\' 9"  (1.753 m)    Physical Exam Vitals and nursing note reviewed.  Constitutional:      General: He is not in acute distress.    Appearance: Normal appearance. He is not ill-appearing, toxic-appearing or diaphoretic.  HENT:     Head: Normocephalic and atraumatic.  Eyes:     Extraocular Movements: Extraocular  movements intact.  Cardiovascular:     Rate and Rhythm: Normal rate and regular rhythm.     Pulses: Normal pulses.     Heart sounds: Normal heart sounds.  Pulmonary:     Effort: Pulmonary effort is normal. No respiratory distress.     Breath sounds: Normal breath sounds. No stridor. No wheezing, rhonchi or rales.  Musculoskeletal:     Cervical back: Normal range of motion.     Right lower leg: No edema.     Left lower leg: No edema.  Neurological:     General: No focal deficit present.     Mental Status: He is alert and oriented to person, place, and time.     Assessment & Plan:   See Encounters Tab for problem based charting.  Patient discussed with and seen by Dr. Jimmye Norman

## 2020-06-04 LAB — LIPID PANEL
Chol/HDL Ratio: 6.5 ratio — ABNORMAL HIGH (ref 0.0–5.0)
Cholesterol, Total: 324 mg/dL — ABNORMAL HIGH (ref 100–199)
HDL: 50 mg/dL (ref >39)
LDL Chol Calc (NIH): 253 mg/dL — ABNORMAL HIGH (ref 0–99)
Triglycerides: 119 mg/dL (ref 0–149)
VLDL Cholesterol Cal: 21 mg/dL (ref 5–40)

## 2020-06-04 NOTE — Progress Notes (Signed)
Patient with LDL of 253 from 171 a year ago. Will have front desk schedule appointment for patient as soon as possible for him to be seen and to discuss starting statin therapy. Patient has been hesitant to start medications in the past so believe this would be best to have in person.

## 2020-06-05 ENCOUNTER — Encounter: Payer: Self-pay | Admitting: Internal Medicine

## 2020-06-09 NOTE — Progress Notes (Signed)
DOS 06/03/20:  Internal Medicine Clinic Attending  I saw and evaluated the patient.  I personally confirmed the key portions of the history and exam documented by Dr. Johnney Ou and I reviewed pertinent patient test results.  The assessment, diagnosis, and plan were formulated together and I agree with the documentation in the resident's note.

## 2020-06-18 ENCOUNTER — Other Ambulatory Visit: Payer: Self-pay

## 2020-06-18 ENCOUNTER — Ambulatory Visit (INDEPENDENT_AMBULATORY_CARE_PROVIDER_SITE_OTHER): Payer: Medicare Other | Admitting: Podiatry

## 2020-06-18 DIAGNOSIS — M79676 Pain in unspecified toe(s): Secondary | ICD-10-CM

## 2020-06-18 DIAGNOSIS — E0842 Diabetes mellitus due to underlying condition with diabetic polyneuropathy: Secondary | ICD-10-CM

## 2020-06-18 DIAGNOSIS — B351 Tinea unguium: Secondary | ICD-10-CM | POA: Diagnosis not present

## 2020-06-18 DIAGNOSIS — L989 Disorder of the skin and subcutaneous tissue, unspecified: Secondary | ICD-10-CM | POA: Diagnosis not present

## 2020-06-18 NOTE — Progress Notes (Signed)
    Subjective: °Patient is a 69 y.o. male presenting to the office today for follow up evaluation of callus lesions of the left foot. Wearing shoes and walking increases the pain. He has not done anything for treatment at home.   °Patient also complains of elongated, thickened nails that cause pain while ambulating in shoes. He is unable to trim his own nails. Patient presents today for further treatment and evaluation. ° °Past Medical History:  °Diagnosis Date  °• Duodenitis   °• Essential hypertension 07/16/2015  °• Glaucoma   °• H/O: substance abuse ([facility])   °• History of frostbite   °• History of prostate cancer 03/20/2014  ° Lost to f/u with [facility] in the past 2011 with elevated PSA >30 at that time.  Saw [facility] (Dr. Grapey) on 03/13/14. Cancer Noted 03/13/14 office visit. Gleason score 7. Plan CT Ab/pelvis with contrast, bone scan   °• History of radiation therapy nov 2015 to feb 2016  °• Hyperlipidemia 11/20/2019  °• Iron deficiency anemia   °• Peripheral neuropathy   °• Prostate cancer ([facility]) 03/05/14  ° Gleason 7, volume 45 gm  °• Rash and nonspecific skin eruption 08/04/2017  °• S/P radiation therapy 09/18/14-11/15/14  ° prostate/ 7800Gy/40sessions  °• Thrombocytopenia ([facility])   ° ° °Objective:  °Physical Exam °General: Alert and oriented x3 in no acute distress ° °Dermatology: Hyperkeratotic lesions present on the left foot. Pain on palpation with a central nucleated core noted. Skin is warm, dry and supple bilateral lower extremities. Negative for open lesions or macerations. Nails are tender, long, thickened and dystrophic with subungual debris, consistent with onychomycosis, 1-5 bilateral. No signs of infection noted. ° °Vascular: Palpable pedal pulses bilaterally. No edema or erythema noted. Capillary refill within normal limits. ° °Neurological: Epicritic and protective threshold grossly intact bilaterally.  ° °Musculoskeletal Exam: Pain on palpation at the keratotic lesions noted. Range  of motion within normal limits bilateral. Muscle strength 5/5 in all groups bilateral. ° °Assessment: °1. Onychodystrophic nails 1-5 bilateral with hyperkeratosis of nails.  °2. Onychomycosis of nail due to dermatophyte bilateral °3. Pre-ulcerative callus lesions noted to the left foot x 2 ° ° °Plan of Care:  °1. Patient evaluated. °2. Excisional debridement of keratoic lesions using a chisel blade was performed without incident.  °3. Dressed with light dressing. °4. Mechanical debridement of nails 1-5 bilaterally performed using a nail nipper. Filed with dremel without incident.  °5. Patient is to return to the clinic in 3 months.  ° °William Jones, DPM °[facility] ° °Dr. Angalina Ante M. Haille Pardi, DPM  °  °[address]                                        °[place], [place] [address]                °Office [phone]  °Fax [phone] ° ° °

## 2020-06-19 ENCOUNTER — Ambulatory Visit (INDEPENDENT_AMBULATORY_CARE_PROVIDER_SITE_OTHER): Payer: Medicare Other | Admitting: Internal Medicine

## 2020-06-19 ENCOUNTER — Other Ambulatory Visit: Payer: Self-pay

## 2020-06-19 ENCOUNTER — Encounter: Payer: Self-pay | Admitting: Internal Medicine

## 2020-06-19 VITALS — BP 124/91 | HR 74 | Temp 97.9°F | Ht 69.0 in | Wt 188.0 lb

## 2020-06-19 DIAGNOSIS — I10 Essential (primary) hypertension: Secondary | ICD-10-CM | POA: Diagnosis not present

## 2020-06-19 DIAGNOSIS — E782 Mixed hyperlipidemia: Secondary | ICD-10-CM | POA: Diagnosis not present

## 2020-06-19 MED ORDER — ATORVASTATIN CALCIUM 40 MG PO TABS: 40.0000 mg | ORAL_TABLET | Freq: Every day | ORAL | 11 refills | Status: DC

## 2020-06-19 NOTE — Progress Notes (Signed)
   CC: Follow-up hypertension  HPI:  [name] is a 69 y.o. with medical history significant for hypertension, hyperlipidemia presented to follow-up on chronic medical problems.  Please see problem based charting for further details.  Past Medical History:  Diagnosis Date  . Duodenitis   . Essential hypertension 07/16/2015  . Glaucoma   . H/O: substance abuse ([place])   . History of frostbite   . History of prostate cancer 03/20/2014   Lost to f/u with [facility] in the past 2011 with elevated PSA >30 at that time.  Saw [facility] (Dr. Risa Grill) on 03/13/14. Cancer Noted 03/13/14 office visit. Gleason score 7. Plan CT Ab/pelvis with contrast, bone scan   . History of radiation therapy nov 2015 to feb 2016  . Hyperlipidemia 11/20/2019  . Iron deficiency anemia   . Peripheral neuropathy   . Prostate cancer ([place]) 03/05/14   Gleason 7, volume 45 gm  . Rash and nonspecific skin eruption 08/04/2017  . S/P radiation therapy 09/18/14-11/15/14   prostate/ 7800Gy/40sessions  . Thrombocytopenia ([place])    Review of Systems:  As per HPI  Physical Exam:  Vitals:   06/19/20 0859  BP: (!) 124/91  Pulse: 74  Temp: 97.9 F (36.6 C)  TempSrc: Oral  SpO2: 100%  Weight: 188 lb (85.3 kg)  Height: 5\' 9"  (1.753 m)   Physical Exam Cardiovascular:     Rate and Rhythm: Normal rate.     Heart sounds: No murmur heard.   Pulmonary:     Effort: Pulmonary effort is normal.     Breath sounds: No rales.  Neurological:     Mental Status: He is alert.  Psychiatric:        Mood and Affect: Mood normal.        Behavior: Behavior normal.     Assessment & Plan:   See Encounters Tab for problem based charting.  Patient discussed with Dr. Daryll Drown

## 2020-06-19 NOTE — Patient Instructions (Addendum)
William Jones,   It was a pleasure taking care of you in the clinic today.  Your blood pressure is doing well and I will have you continue medicine.  Your cholesterol is high so I will start you on a cholesterol medication called Lipitor.  You take 1 pill once a day.  Take care! Dr. Eileen Stanford  Please call the internal medicine center clinic if you have any questions or concerns, we may be able to help and keep you from a long and expensive emergency room wait. Our clinic and after hours phone number is [phone], the best time to call is Monday through Friday 9 am to 4 pm but there is always someone available 24/7 if you have an emergency. If you need medication refills please notify your pharmacy one week in advance and they will send [place] a request.

## 2020-06-19 NOTE — Assessment & Plan Note (Signed)
Hyperlipidemia: Labs on 8/31 showed total cholesterol 224, LDL of 253.  ASCVD risk score is 19.5%.  He states that he used to take fish oil  Plan: -Start Lipitor 40 mg daily -Repeat lipid panel in 4 to 6 weeks with goal 50% reduction in LDL. -Referral to CCM has been placed as well.

## 2020-06-19 NOTE — Assessment & Plan Note (Signed)
Hypertension: Well-controlled.  Was seen in the clinic on 06/03/2020 and amlodipine 2.5 mg was added to his blood pressure regimen  BP Readings from Last 3 Encounters:  06/19/20 (!) 124/91  06/03/20 (!) 129/98  01/21/20 (!) 179/108   Plan: -Continue Amlodipine 2.5 mg daily, hydrochlorothiazide 25 mg daily

## 2020-06-20 ENCOUNTER — Telehealth: Payer: Self-pay | Admitting: *Deleted

## 2020-06-20 NOTE — Chronic Care Management (AMB) (Signed)
Chronic Care Management   Note  06/20/2020 Name: William Jones MRN: [id number] DOB: October 20, 1950  William Jones is a 69 y.o. year old male who is a primary care patient of Aslam, [name], MD. I reached out to [place] by phone today in response to a referral sent by [name] PCP, Harvie Heck, MD.     William Jones was given information about Chronic Care Management services today including:  1. CCM service includes personalized support from designated clinical staff supervised by his physician, including individualized plan of care and coordination with other care providers 2. 24/7 contact phone numbers for assistance for urgent and routine care needs. 3. Service will only be billed when office clinical staff spend 20 minutes or more in a month to coordinate care. 4. Only one practitioner may furnish and bill the service in a calendar month. 5. The patient may stop CCM services at any time (effective at the end of the month) by phone call to the office staff. 6. The patient will be responsible for cost sharing (co-pay) of up to 20% of the service fee (after annual deductible is met).  Patient agreed to services and verbal consent obtained.   Follow up plan: Telephone appointment with care management team member scheduled for:06/25/2020  [facility]

## 2020-06-25 ENCOUNTER — Ambulatory Visit: Payer: Medicare Other | Admitting: *Deleted

## 2020-06-25 DIAGNOSIS — C61 Malignant neoplasm of prostate: Secondary | ICD-10-CM

## 2020-06-25 DIAGNOSIS — E782 Mixed hyperlipidemia: Secondary | ICD-10-CM

## 2020-06-25 DIAGNOSIS — I10 Essential (primary) hypertension: Secondary | ICD-10-CM

## 2020-06-25 NOTE — Chronic Care Management (AMB) (Signed)
Chronic Care Management   Initial Visit Note  06/25/2020 Name: William Jones MRN: [id number] DOB: 12-23-1950  Referred by: Harvie Heck, MD Reason for referral : Chronic Care Management (HTN, HLD)   William Jones is a 69 y.o. year old male who is a primary care patient of Aslam, William Freshwater, MD. [name] was consulted for assistance with chronic disease management and care coordination needs related to HTN and HLD  Review of patient status, including review of consultants reports, relevant laboratory and other test results, and collaboration with appropriate care team members and the patient's provider was performed as part of comprehensive patient evaluation and provision of chronic care management services.    SDOH (Social Determinants of Health) assessments performed: Yes See Care Plan activities for detailed interventions related to SDOH  SDOH Interventions     Most Recent Value  SDOH Interventions  Financial Strain Interventions Intervention Not Indicated  Housing Interventions Intervention Not Indicated  Intimate Partner Violence Interventions Intervention Not Indicated  Physical Activity Interventions Intervention Not Indicated  Stress Interventions Intervention Not Indicated  Social Connections Interventions Intervention Not Indicated  Transportation Interventions Intervention Not Indicated       Medications: Outpatient Encounter Medications as of 06/25/2020  Medication Sig  . amLODipine (NORVASC) 2.5 MG tablet Take 1 tablet (2.5 mg total) by mouth daily.  [name] atorvastatin (LIPITOR) 40 MG tablet Take 1 tablet (40 mg total) by mouth daily.  [name] BETIMOL 0.5 % ophthalmic solution INT 1 GTT IN OD BID  . brimonidine (ALPHAGAN) 0.2 % ophthalmic solution INT 1 GTT INTO THE RIGHT EYE BID  . cholecalciferol (VITAMIN D3) 10 MCG (400 UNIT) TABS tablet Take 10,000 Units by mouth daily.  . dorzolamide (TRUSOPT) 2 % ophthalmic solution INT 1 GTT IN OD BID  . ferrous sulfate  325 (65 FE) MG tablet Take 1 tablet (325 mg total) by mouth daily with breakfast.  . hydrochlorothiazide (HYDRODIURIL) 25 MG tablet TAKE 1 TABLET(25 MG) BY MOUTH DAILY  . timolol (TIMOPTIC) 0.5 % ophthalmic solution PLACE ONE DROP 2 TIMES INTO THE RIGHT EYE UTD  . triamcinolone (KENALOG) 0.025 % ointment APPLY EXTERNALLY TO THE AFFECTED AREA TWICE DAILY  . XTANDI 40 MG capsule Take 160 mg by mouth daily.  [name] ofloxacin (OCUFLOX) 0.3 % ophthalmic solution  (Patient not taking: Reported on 06/25/2020)  . prednisoLONE acetate (PRED FORTE) 1 % ophthalmic suspension  (Patient not taking: Reported on 06/25/2020)  . sildenafil (VIAGRA) 100 MG tablet TAKE 1 TABLET BY MOUTH 1 HOURS BEFORE INTERCOURSE   No facility-administered encounter medications on file as of 06/25/2020.     Objective: See care plan  Goals Addressed              This Visit's Progress     Patient Stated   .  " What can I do to improve my cholesterol numbers?" (pt-stated)        CARE PLAN ENTRY (see longitudinal plan of care for additional care plan information)  Current Barriers:  . Chronic Disease Management support, education, and care coordination needs related to HTN and HLD  Clinical Goal(s) related to HTN and HLD:  Over the next 30-60 days, patient will:  . Work with the care management team to address educational, disease management, and care coordination needs  . Begin or continue self health monitoring activities as directed today reduce the amount of sweetened soda consumption from 1 liter per day to 1/2 liter per day . Call provider office for new  or worsened signs and symptoms Chest pain, Shortness of breath, and New or worsened symptom related to HTN  . Call care management team with questions or concerns . Verbalize basic understanding of patient centered plan of care established today  Interventions related to HTN and HLD:  . Evaluation of current treatment plans and patient's adherence to plan as  established by provider . Assessed patient understanding of disease states . Reviewed results of lipid panel of 06/03/20 and discussed treatment targets . Assessed patient's education and care coordination needs . Provided disease specific education to patient - Mailed patient educational handout on Controlling Cholesterol and Triglycerides and Emmi educational article on low cholesterol, low saturated fat and low trans fat diet. Requested patient review educational material prior to next month's call . Collaborated with appropriate clinical care team members regarding patient needs . Discussed plans with patient for ongoing care management follow up and provided patient with direct contact information for care management team  Patient Self Care Activities related to HTN and HLD:  . Patient is unable to independently self-manage chronic health conditions  Initial goal documentation    .  " What should I eat to help lower my blood pressure" (pt-stated)        CARE PLAN ENTRY (see longitudinal plan of care for additional care plan information)  Objective:  . Last practice recorded BP readings:  BP Readings from Last 3 Encounters:  06/19/20 (!) 124/91  06/03/20 (!) 129/98  01/21/20 (!) 179/108 .   [name] Most recent eGFR/CrCl: No results found for: EGFR  No components found for: CRCL  Current Barriers:  [name] Knowledge Deficits related to basic understanding of hypertension pathophysiology and self care management  Case Manager Clinical Goal(s):  [name] Over the next 30-60 days, patient will verbalize understanding of plan for hypertension management . Over the next 30-60 days, patient will attend all scheduled medical appointments.  Interventions:  . Evaluation of current treatment plan related to hypertension self management and patient's adherence to plan as established by provider. . Provided education to patient re: stroke prevention, s/s of heart attack and stroke, DASH diet, complications of  uncontrolled blood pressure- mailed Emmi education on BP testing, checking BP at home, Controlling BP via Lifestyle and DASH diet; asked patient to review educational material prior to next month's call . Mailed patient [facility] spiral bound calendar with business cards of CCM BSW and CCM RN, encouraged patient to write self monitored BP readings in the appropriate section (HTN) once he obtains a home BP monitor . Reviewed medications with patient and discussed importance of compliance . Mailed patient small medication box . Discussed plans with patient for ongoing care management follow up and provided patient with direct contact information for care management team . Reviewed scheduled/upcoming provider appointments including: with podiatrist on 11/24   Patient Self Care Activities:  . UNABLE to independently manage HTN to meet treatment targets . Self administers medications as prescribed . Will make follow up clinic appointment for mid November . Attends all scheduled provider appointments . Follows a low sodium diet/DASH diet . Orders home blood pressure monitor via [facility] Medicare Over the Counter items benefits card  Initial goal documentation      .  Blood Pressure < 140/90 (pt-stated)        BP Readings from Last 3 Encounters:  06/19/20 (!) 124/91  06/03/20 (!) 129/98  01/21/20 (!) 179/108       .  Weight (  lb) < 175 lb (79.4 kg) (pt-stated)        Wt Readings from Last 3 Encounters:  06/19/20 188 lb (85.3 kg)  06/03/20 185 lb 8 oz (84.1 kg)  01/21/20 183 lb 11.2 oz (83.3 kg)         Other   .  LDL CALC < 100        Lab Results  Component Value Date   CHOL 324 (H) 06/03/2020   HDL 50 06/03/2020   LDLCALC 253 (H) 06/03/2020   TRIG 119 06/03/2020   CHOLHDL 6.5 (H) 06/03/2020   Not meeting treatment targets for total cholesterol and LDL        Mr. Varricchio was given information about Chronic Care Management services  today including:  1. CCM service includes personalized support from designated clinical staff supervised by his physician, including individualized plan of care and coordination with other care providers 2. 24/7 contact phone numbers for assistance for urgent and routine care needs. 3. Service will only be billed when office clinical staff spend 20 minutes or more in a month to coordinate care. 4. Only one practitioner may furnish and bill the service in a calendar month. 5. The patient may stop CCM services at any time (effective at the end of the month) by phone call to the office staff. 6. The patient will be responsible for cost sharing (co-pay) of up to 20% of the service fee (after annual deductible is met).  Patient agreed to services and verbal consent obtained.   Plan:   The care management team will reach out to the patient again over the next 30-60 days.   Kelli Churn RN, CCM, [facility] RN Care Manager [phone]

## 2020-06-25 NOTE — Patient Instructions (Signed)
Visit Information It was nice speaking with you today. Goals Addressed              This Visit's Progress     Patient Stated     " What can I do to improve my cholesterol numbers?" (pt-stated)        CARE PLAN ENTRY (see longitudinal plan of care for additional care plan information)  Current Barriers:   Chronic Disease Management support, education, and care coordination needs related to HTN and HLD  Clinical Goal(s) related to HTN and HLD:  Over the next 30-60 days, patient will:   Work with the care management team to address educational, disease management, and care coordination needs   Begin or continue self health monitoring activities as directed today reduce the amount of sweetened soda consumption from 1 liter per day to 1/2 liter per day  Call provider office for new or worsened signs and symptoms Chest pain, Shortness of breath, and New or worsened symptom related to HTN   Call care management team with questions or concerns  Verbalize basic understanding of patient centered plan of care established today  Interventions related to HTN and HLD:   Evaluation of current treatment plans and patient's adherence to plan as established by provider  Assessed patient understanding of disease states  Reviewed results of lipid panel of 06/03/20 and discussed treatment targets  Assessed patient's education and care coordination needs  Provided disease specific education to patient - Mailed patient educational handout on Controlling Cholesterol and Triglycerides and Emmi educational article on low cholesterol, low saturated fat and low trans fat diet. Requested patient review educational material prior to next month's call  Collaborated with appropriate clinical care team members regarding patient needs  Discussed plans with patient for ongoing care management follow up and provided patient with direct contact information for care management team  Patient Self Care  Activities related to HTN and HLD:   Patient is unable to independently self-manage chronic health conditions  Initial goal documentation      " What should I eat to help lower my blood pressure" (pt-stated)        CARE PLAN ENTRY (see longitudinal plan of care for additional care plan information)  Objective:   Last practice recorded BP readings:  BP Readings from Last 3 Encounters:  06/19/20 (!) 124/91  06/03/20 (!) 129/98  01/21/20 (!) 179/108     Most recent eGFR/CrCl: No results found for: EGFR  No components found for: CRCL  Current Barriers:   Knowledge Deficits related to basic understanding of hypertension pathophysiology and self care management  Case Manager Clinical Goal(s):   Over the next 30-60 days, patient will verbalize understanding of plan for hypertension management  Over the next 30-60 days, patient will attend all scheduled medical appointments.  Interventions:   Evaluation of current treatment plan related to hypertension self management and patient's adherence to plan as established by provider.  Provided education to patient re: stroke prevention, s/s of heart attack and stroke, DASH diet, complications of uncontrolled blood pressure- mailed Emmi education on BP testing, checking BP at home, Controlling BP via Lifestyle and DASH diet; asked patient to review educational material prior to next month's call  Mailed patient [facility] spiral bound calendar with business cards of CCM BSW and CCM RN, encouraged patient to write self monitored BP readings in the appropriate section (HTN) once he obtains a home BP monitor  Reviewed medications with patient and discussed  importance of compliance  Mailed patient small medication box  Discussed plans with patient for ongoing care management follow up and provided patient with direct contact information for care management team  Reviewed scheduled/upcoming provider  appointments including: with podiatrist on 11/24   Patient Self Care Activities:   UNABLE to independently manage HTN to meet treatment targets  Self administers medications as prescribed  Will make follow up clinic appointment for mid November  Attends all scheduled provider appointments  Follows a low sodium diet/DASH diet  Orders home blood pressure monitor via [facility] Medicare Over the Counter items benefits card  Initial goal documentation        Blood Pressure < 140/90 (pt-stated)        BP Readings from Last 3 Encounters:  06/19/20 (!) 124/91  06/03/20 (!) 129/98  01/21/20 (!) 179/108         Weight (lb) < 175 lb (79.4 kg) (pt-stated)        Wt Readings from Last 3 Encounters:  06/19/20 188 lb (85.3 kg)  06/03/20 185 lb 8 oz (84.1 kg)  01/21/20 183 lb 11.2 oz (83.3 kg)         Other     LDL CALC < 100        Lab Results  Component Value Date   CHOL 324 (H) 06/03/2020   HDL 50 06/03/2020   LDLCALC 253 (H) 06/03/2020   TRIG 119 06/03/2020   CHOLHDL 6.5 (H) 06/03/2020   Not meeting treatment targets for total cholesterol and LDL       Mr. Vondrak was given information about Chronic Care Management services today including:  1. CCM service includes personalized support from designated clinical staff supervised by his physician, including individualized plan of care and coordination with other care providers 2. 24/7 contact phone numbers for assistance for urgent and routine care needs. 3. Service will only be billed when office clinical staff spend 20 minutes or more in a month to coordinate care. 4. Only one practitioner may furnish and bill the service in a calendar month. 5. The patient may stop CCM services at any time (effective at the end of the month) by phone call to the office staff. 6. The patient will be responsible for cost sharing (co-pay) of up to 20% of the service fee (after annual deductible is met).  Patient agreed to  services and verbal consent obtained.   The patient verbalized understanding of instructions provided today and declined a print copy of patient instruction materials.   The care management team will reach out to the patient again over the next 30-60 days.   Kelli Churn RN, CCM, [facility] RN Care Manager [phone]

## 2020-06-25 NOTE — Progress Notes (Signed)
Internal Medicine Clinic Attending  Case discussed with Dr. Agyei  At the time of the visit.  We reviewed the resident's history and exam and pertinent patient test results.  I agree with the assessment, diagnosis, and plan of care documented in the resident's note.  

## 2020-07-21 ENCOUNTER — Ambulatory Visit: Payer: Medicare Other | Admitting: Podiatry

## 2020-07-25 ENCOUNTER — Ambulatory Visit: Payer: Medicare Other | Admitting: *Deleted

## 2020-07-25 DIAGNOSIS — C7951 Secondary malignant neoplasm of bone: Secondary | ICD-10-CM

## 2020-07-25 DIAGNOSIS — E782 Mixed hyperlipidemia: Secondary | ICD-10-CM

## 2020-07-25 DIAGNOSIS — I10 Essential (primary) hypertension: Secondary | ICD-10-CM

## 2020-07-25 DIAGNOSIS — C61 Malignant neoplasm of prostate: Secondary | ICD-10-CM

## 2020-07-25 NOTE — Patient Instructions (Signed)
Visit Information It was nice speaking with you today. Goals Addressed              This Visit's Progress     Patient Stated   .  " What can I do to improve my cholesterol numbers?" (pt-stated)        CARE PLAN ENTRY (see longitudinal plan of care for additional care plan information)  Current Barriers:  . Chronic Disease Management support, education, and care coordination needs related to HTN and HLD- spoke with patient and follow up assessment completed. Patient verifies he received the HTN and HLD educational material, day planner/calendar with CCM team's business cards and medication box this CCM RN mailed to him last month,   says he reviewed the educational information on managing HTN and Cholesterol, reports he is drinking more water and less sweetened soda, rinsing canned vegetable before he eats them, and has reduced his consumption of packaged sweets like Honey Buns, he says he was unable to start the atorvastatin because his [employer] plan requires prior approval.   Clinical Goal(s) related to HTN and HLD:  Over the next 30-60 days, patient will:  . Work with the care management team to address educational, disease management, and care coordination needs  . Begin or continue self health monitoring activities as directed today reduce the amount of sweetened soda consumption from 1 liter per day to 1/2 liter per day . Call provider office for new or worsened signs and symptoms Chest pain, Shortness of breath, and New or worsened symptom related to HTN  . Call care management team with questions or concerns . Verbalize basic understanding of patient centered plan of care established today  Interventions related to HTN and HLD:  . Evaluation of current treatment plans and patient's adherence to plan as established by provider . Assessed patient understanding of disease states and strategies he has implemented to improve his cholesterol panel . Assessed patient's  education and care coordination needs . Provided time for patient to ask questions about the disease specific education that was mailed to him, Controlling Cholesterol and Triglycerides and Emmi educational article on low cholesterol, low saturated fat and low trans fat diet . Provided positive reinforcement to patient for implementing strategies to improve his lipid panel . Collaborated with appropriate clinical care team members regarding patient needs- advised patient this CCM RN will message clinic provider that atorvastatin requires prior approval per patient's health plan and therefore he has not started statin therapy . Discussed plans with patient for ongoing care management follow up and provided patient with direct contact information for care management team  Patient Self Care Activities related to HTN and HLD:  . Patient is unable to independently self-manage chronic health conditions  Please see past updates related to this goal by clicking on the "Past Updates" button in the selected goal     .  " What should I eat to help lower my blood pressure" (pt-stated)        CARE PLAN ENTRY (see longitudinal plan of care for additional care plan information)  Objective:  . Last practice recorded BP readings:  BP Readings from Last 3 Encounters:  06/19/20 (!) 124/91  06/03/20 (!) 129/98  01/21/20 (!) 179/108 .   [name] Most recent eGFR/CrCl: No results found for: EGFR  No components found for: CRCL  Current Barriers:  [name] Knowledge Deficits related to basic understanding of hypertension pathophysiology and self care management- patient says he ordered a home blood  pressure monitor through his over the counter booklet he receives from his health plan but has not yet received the monitor, he says he has reduced his sodium consumption and is rinsing canned vegetables with water before he consumes them, he also says he is buying fresh fruits and vegetables as opposed to eating packaged meals, he  reports good medication taking behavior in regards to his blood pressure medicines and reports no adverse side effects  Case Manager Clinical Goal(s):  [name] Over the next 30-60 days, patient will verbalize understanding of plan for hypertension management . Over the next 30-60 days, patient will attend all scheduled medical appointments.  Interventions:  . Evaluation of current treatment plan related to hypertension self management and patient's adherence to plan as established by provider. . Provided opportunity for patient to ask questions about the education that was mailed to him related to  stroke prevention, s/s of heart attack and stroke, DASH diet, complications of uncontrolled blood pressure, BP testing, checking BP at home, and Controlling BP via Lifestyle and DASH diet;  . Reviewed medications with patient and assessed medication taking behavior . Verified that patient received small medication box that was mailed to him . Positive reinforcement given to patient for implementing strategies to improve his blood pressure  . Encouraged patient to contact this CCM RN if he has questions on the proper use of the home blood pressure monitor when he receives it in the mail . Discussed plans with patient for ongoing care management follow up and provided patient with direct contact information for care management team . Reviewed scheduled/upcoming provider appointments including: with podiatrist on 08/27/20  Patient Self Care Activities:  . UNABLE to independently manage HTN to meet treatment targets . Self administers medications as prescribed . Will make follow up clinic appointment for mid November . Attends all scheduled provider appointments . Follows a low sodium diet/DASH diet . Orders home blood pressure monitor via [facility] Medicare Over the Counter items benefits card   Please see past updates related to this goal by clicking on the "Past Updates" button in the selected  goal        The patient verbalized understanding of instructions provided today and declined a print copy of patient instruction materials.   The care management team will reach out to the patient again over the next 30-60 days.   Kelli Churn RN, CCM, [facility] RN Care Manager [phone]

## 2020-07-25 NOTE — Chronic Care Management (AMB) (Signed)
Chronic Care Management   Follow Up Note   07/25/2020 Name: William Jones MRN: [id number] DOB: 1950/11/26  Referred by: Harvie Heck, MD Reason for referral : Chronic Care Management (HTN, HLD, Prostate Ca with mets to bone)   William Jones is a 69 y.o. year old male who is a primary care patient of Aslam, [name], MD. [name] was consulted for assistance with chronic disease management and care coordination needs.    Review of patient status, including review of consultants reports, relevant laboratory and other test results, and collaboration with appropriate care team members and the patient's provider was performed as part of comprehensive patient evaluation and provision of chronic care management services.    SDOH (Social Determinants of Health) assessments performed: No See Care Plan activities for detailed interventions related to [facility])     Outpatient Encounter Medications as of 07/25/2020  Medication Sig Note  . amLODipine (NORVASC) 2.5 MG tablet Take 1 tablet (2.5 mg total) by mouth daily.   [name] BETIMOL 0.5 % ophthalmic solution INT 1 GTT IN OD BID   . brimonidine (ALPHAGAN) 0.2 % ophthalmic solution INT 1 GTT INTO THE RIGHT EYE BID   . cholecalciferol (VITAMIN D3) 10 MCG (400 UNIT) TABS tablet Take 10,000 Units by mouth daily.   . dorzolamide (TRUSOPT) 2 % ophthalmic solution INT 1 GTT IN OD BID   . ferrous sulfate 325 (65 FE) MG tablet Take 1 tablet (325 mg total) by mouth daily with breakfast.   . hydrochlorothiazide (HYDRODIURIL) 25 MG tablet TAKE 1 TABLET(25 MG) BY MOUTH DAILY   . triamcinolone (KENALOG) 0.025 % ointment APPLY EXTERNALLY TO THE AFFECTED AREA TWICE DAILY   . XTANDI 40 MG capsule Take 160 mg by mouth daily.   [name] atorvastatin (LIPITOR) 40 MG tablet Take 1 tablet (40 mg total) by mouth daily. (Patient not taking: Reported on 07/25/2020) 07/25/2020: Patient reports his health insurance plan has not approved the atorvastatin so he has not  started statin therapy  . ofloxacin (OCUFLOX) 0.3 % ophthalmic solution  (Patient not taking: Reported on 06/25/2020)   . prednisoLONE acetate (PRED FORTE) 1 % ophthalmic suspension  (Patient not taking: Reported on 06/25/2020)   . sildenafil (VIAGRA) 100 MG tablet TAKE 1 TABLET BY MOUTH 1 HOURS BEFORE INTERCOURSE   . timolol (TIMOPTIC) 0.5 % ophthalmic solution PLACE ONE DROP 2 TIMES INTO THE RIGHT EYE UTD    No facility-administered encounter medications on file as of 07/25/2020.      Objective:  BP Readings from Last 3 Encounters:  06/19/20 (!) 124/91  06/03/20 (!) 129/98  01/21/20 (!) 179/108   Wt Readings from Last 3 Encounters:  06/19/20 188 lb (85.3 kg)  06/03/20 185 lb 8 oz (84.1 kg)  01/21/20 183 lb 11.2 oz (83.3 kg)   Lab Results  Component Value Date   HGBA1C 6.6 (A) 06/03/2020   Lab Results  Component Value Date   CHOL 324 (H) 06/03/2020   HDL 50 06/03/2020   LDLCALC 253 (H) 06/03/2020   TRIG 119 06/03/2020   CHOLHDL 6.5 (H) 06/03/2020    Goals Addressed              This Visit's Progress     Patient Stated   .  " What can I do to improve my cholesterol numbers?" (pt-stated)        CARE PLAN ENTRY (see longitudinal plan of care for additional care plan information)  Current Barriers:  . Chronic Disease Management  support, education, and care coordination needs related to HTN and HLD- spoke with patient and follow up assessment completed. Patient verifies he received the HTN and HLD educational material, day planner/calendar with CCM team's business cards and medication box this CCM RN mailed to him last month,   says he reviewed the educational information on managing HTN and Cholesterol, reports he is drinking more water and less sweetened soda, rinsing canned vegetable before he eats them, and has reduced his consumption of packaged sweets like Honey Buns, he says he was unable to start the atorvastatin because his [employer] plan requires  prior approval.   Clinical Goal(s) related to HTN and HLD:  Over the next 30-60 days, patient will:  . Work with the care management team to address educational, disease management, and care coordination needs  . Begin or continue self health monitoring activities as directed today reduce the amount of sweetened soda consumption from 1 liter per day to 1/2 liter per day . Call provider office for new or worsened signs and symptoms Chest pain, Shortness of breath, and New or worsened symptom related to HTN  . Call care management team with questions or concerns . Verbalize basic understanding of patient centered plan of care established today  Interventions related to HTN and HLD:  . Evaluation of current treatment plans and patient's adherence to plan as established by provider . Assessed patient understanding of disease states and strategies he has implemented to improve his cholesterol panel . Assessed patient's education and care coordination needs . Provided time for patient to ask questions about the disease specific education that was mailed to him, Controlling Cholesterol and Triglycerides and Emmi educational article on low cholesterol, low saturated fat and low trans fat diet . Provided positive reinforcement to patient for implementing strategies to improve his lipid panel . Collaborated with appropriate clinical care team members regarding patient needs- advised patient this CCM RN will message clinic provider that atorvastatin requires prior approval per patient's health plan and therefore he has not started statin therapy . Discussed plans with patient for ongoing care management follow up and provided patient with direct contact information for care management team  Patient Self Care Activities related to HTN and HLD:  . Patient is unable to independently self-manage chronic health conditions  Please see past updates related to this goal by clicking on the "Past Updates" button in  the selected goal     .  " What should I eat to help lower my blood pressure" (pt-stated)        CARE PLAN ENTRY (see longitudinal plan of care for additional care plan information)  Objective:  . Last practice recorded BP readings:  BP Readings from Last 3 Encounters:  06/19/20 (!) 124/91  06/03/20 (!) 129/98  01/21/20 (!) 179/108 .   [name] Most recent eGFR/CrCl: No results found for: EGFR  No components found for: CRCL  Current Barriers:  [name] Knowledge Deficits related to basic understanding of hypertension pathophysiology and self care management- patient says he ordered a home blood pressure monitor through his over the counter booklet he receives from his health plan but has not yet received the monitor, he says he has reduced his sodium consumption and is rinsing canned vegetables with water before he consumes them, he also says he is buying fresh fruits and vegetables as opposed to eating packaged meals, he reports good medication taking behavior in regards to his blood pressure medicines and reports no adverse side effects  Case Manager Clinical Goal(s):  [name] Over the next 30-60 days, patient will verbalize understanding of plan for hypertension management . Over the next 30-60 days, patient will attend all scheduled medical appointments.  Interventions:  . Evaluation of current treatment plan related to hypertension self management and patient's adherence to plan as established by provider. . Provided opportunity for patient to ask questions about the education that was mailed to him related to  stroke prevention, s/s of heart attack and stroke, DASH diet, complications of uncontrolled blood pressure, BP testing, checking BP at home, and Controlling BP via Lifestyle and DASH diet;  . Reviewed medications with patient and assessed medication taking behavior . Verified that patient received small medication box that was mailed to him . Positive reinforcement given to patient for  implementing strategies to improve his blood pressure  . Encouraged patient to contact this CCM RN if he has questions on the proper use of the home blood pressure monitor when he receives it in the mail . Discussed plans with patient for ongoing care management follow up and provided patient with direct contact information for care management team . Reviewed scheduled/upcoming provider appointments including: with podiatrist on 08/27/20  Patient Self Care Activities:  . UNABLE to independently manage HTN to meet treatment targets . Self administers medications as prescribed . Will make follow up clinic appointment for mid November . Attends all scheduled provider appointments . Follows a low sodium diet/DASH diet . Orders home blood pressure monitor via [facility] Medicare Over the Counter items benefits card  Initial goal documentation          Plan:   The care management team will reach out to the patient again over the next 30-60 days.    Kelli Churn RN, CCM, [facility] RN Care Manager [phone]

## 2020-07-28 ENCOUNTER — Telehealth: Payer: Medicare Other

## 2020-07-29 NOTE — Progress Notes (Signed)
Internal Medicine Clinic Resident  I have personally reviewed this encounter including the documentation in this note and/or discussed this patient with the care management provider. I will address any urgent items identified by the care management provider and will communicate my actions to the patient's PCP. I have reviewed the patient's CCM visit with my supervising attending, Dr Daryll Drown.  Plan: Will follow up request for prior authorization for Lipitor 40mg  daily   Harvie Heck, MD  [facility] PGY-2 07/29/2020

## 2020-08-01 ENCOUNTER — Telehealth: Payer: Self-pay | Admitting: *Deleted

## 2020-08-01 NOTE — Telephone Encounter (Signed)
Follow up per Dr. Marva Panda for Lipitor.  Patient picked up Atorvastatin 40 mg tablets on 06/19/2020 at 5:19 PM at the [facility] per [facility].  Sander Nephew, RN 08/01/2020 12:21 PM.

## 2020-08-04 NOTE — Progress Notes (Signed)
Internal Medicine Clinic Attending  CCM services provided by the care management provider and their documentation were discussed with Dr. Aslam. We reviewed the pertinent findings, urgent action items addressed by the resident and non-urgent items to be addressed by the PCP.  I agree with the assessment, diagnosis, and plan of care documented in the CCM and resident's note.  Montey Ebel, MD 08/04/2020  

## 2020-08-12 ENCOUNTER — Other Ambulatory Visit: Payer: Self-pay | Admitting: Student

## 2020-08-27 ENCOUNTER — Other Ambulatory Visit: Payer: Self-pay

## 2020-08-27 ENCOUNTER — Ambulatory Visit (INDEPENDENT_AMBULATORY_CARE_PROVIDER_SITE_OTHER): Payer: Medicare Other | Admitting: Podiatry

## 2020-08-27 ENCOUNTER — Telehealth: Payer: Medicare Other

## 2020-08-27 DIAGNOSIS — B351 Tinea unguium: Secondary | ICD-10-CM | POA: Diagnosis not present

## 2020-08-27 DIAGNOSIS — E0842 Diabetes mellitus due to underlying condition with diabetic polyneuropathy: Secondary | ICD-10-CM | POA: Diagnosis not present

## 2020-08-27 DIAGNOSIS — L989 Disorder of the skin and subcutaneous tissue, unspecified: Secondary | ICD-10-CM | POA: Diagnosis not present

## 2020-08-27 DIAGNOSIS — M79676 Pain in unspecified toe(s): Secondary | ICD-10-CM

## 2020-08-27 NOTE — Progress Notes (Signed)
° ° °  Subjective: Patient is a 69 y.o. male presenting to the office today for follow up evaluation of callus lesions of the left foot. Wearing shoes and walking increases the pain. He has not done anything for treatment at home.   Patient also complains of elongated, thickened nails that cause pain while ambulating in shoes. He is unable to trim his own nails. Patient presents today for further treatment and evaluation.  Past Medical History:  Diagnosis Date   Duodenitis    Essential hypertension 07/16/2015   Glaucoma    H/O: substance abuse ([place])    History of frostbite    History of prostate cancer 03/20/2014   Lost to f/u with [facility] in the past 2011 with elevated PSA >30 at that time.  Saw [facility] (Dr. Risa Grill) on 03/13/14. Cancer Noted 03/13/14 office visit. Gleason score 7. Plan CT Ab/pelvis with contrast, bone scan    History of radiation therapy nov 2015 to feb 2016   Hyperlipidemia 11/20/2019   Iron deficiency anemia    Peripheral neuropathy    Prostate cancer ([place]) 03/05/14   Gleason 7, volume 45 gm   Rash and nonspecific skin eruption 08/04/2017   S/P radiation therapy 09/18/14-11/15/14   prostate/ 7800Gy/40sessions   Thrombocytopenia ([place])     Objective:  Physical Exam General: Alert and oriented x3 in no acute distress  Dermatology: Hyperkeratotic lesions present on the left foot. Pain on palpation with a central nucleated core noted. Skin is warm, dry and supple bilateral lower extremities. Negative for open lesions or macerations. Nails are tender, long, thickened and dystrophic with subungual debris, consistent with onychomycosis, 1-5 bilateral. No signs of infection noted.  Vascular: Palpable pedal pulses bilaterally. No edema or erythema noted. Capillary refill within normal limits.  Neurological: Epicritic and protective threshold grossly intact bilaterally.   Musculoskeletal Exam: Pain on palpation at the keratotic lesions noted. Range  of motion within normal limits bilateral. Muscle strength 5/5 in all groups bilateral.  Assessment: 1. Onychodystrophic nails 1-5 bilateral with hyperkeratosis of nails.  2. Onychomycosis of nail due to dermatophyte bilateral 3. Pre-ulcerative callus lesions noted to the left foot x 2   Plan of Care:  1. Patient evaluated. 2. Excisional debridement of keratoic lesions using a chisel blade was performed without incident.  3. Dressed with light dressing. 4. Mechanical debridement of nails 1-5 bilaterally performed using a nail nipper. Filed with dremel without incident.  5. Patient is to return to the clinic in 3 months.   Edrick Kins, DPM [facility]  Dr. Edrick Kins, [place]                                        [place], [place] [address]                Office [phone]  Fax [phone]

## 2020-09-09 ENCOUNTER — Telehealth: Payer: Medicare Other

## 2020-09-10 ENCOUNTER — Ambulatory Visit: Payer: Medicare Other | Admitting: *Deleted

## 2020-09-10 DIAGNOSIS — C61 Malignant neoplasm of prostate: Secondary | ICD-10-CM

## 2020-09-10 DIAGNOSIS — I10 Essential (primary) hypertension: Secondary | ICD-10-CM

## 2020-09-10 DIAGNOSIS — C7951 Secondary malignant neoplasm of bone: Secondary | ICD-10-CM

## 2020-09-10 DIAGNOSIS — E782 Mixed hyperlipidemia: Secondary | ICD-10-CM

## 2020-09-10 NOTE — Chronic Care Management (AMB) (Signed)
Chronic Care Management   Follow Up Note   09/10/2020 Name: William Jones MRN: [id number] DOB: 1950-12-27  Referred by: Harvie Heck, MD Reason for referral : Chronic Care Management (HTN, HLD, Prostate Ca with mets to bone)   William Jones is a 69 y.o. year old male who is a primary care patient of Aslam, [name], MD. [name] was consulted for assistance with chronic disease management and care coordination needs.    Review of patient status, including review of consultants reports, relevant laboratory and other test results, and collaboration with appropriate care team members and the patient's provider was performed as part of comprehensive patient evaluation and provision of chronic care management services.    SDOH (Social Determinants of Health) assessments performed: No See Care Plan activities for detailed interventions related to [facility])     Outpatient Encounter Medications as of 09/10/2020  Medication Sig Note  . amLODipine (NORVASC) 2.5 MG tablet Take 1 tablet (2.5 mg total) by mouth daily.   [name] atorvastatin (LIPITOR) 40 MG tablet Take 1 tablet (40 mg total) by mouth daily. 09/10/2020: Patient reports his health insurance plan has not approved the atorvastatin so he has not started statin therapy. CCM RN has left message with pt's pharmacist to help with clarifying barrier to statin Rx.   . BETIMOL 0.5 % ophthalmic solution INT 1 GTT IN OD BID   . brimonidine (ALPHAGAN) 0.2 % ophthalmic solution INT 1 GTT INTO THE RIGHT EYE BID   . cholecalciferol (VITAMIN D3) 10 MCG (400 UNIT) TABS tablet Take 10,000 Units by mouth daily.   . dorzolamide (TRUSOPT) 2 % ophthalmic solution INT 1 GTT IN OD BID   . ferrous sulfate 325 (65 FE) MG tablet Take 1 tablet (325 mg total) by mouth daily with breakfast.   . hydrochlorothiazide (HYDRODIURIL) 25 MG tablet TAKE 1 TABLET(25 MG) BY MOUTH DAILY   . ofloxacin (OCUFLOX) 0.3 % ophthalmic solution    . prednisoLONE acetate (PRED  FORTE) 1 % ophthalmic suspension    . sildenafil (VIAGRA) 100 MG tablet TAKE 1 TABLET BY MOUTH 1 HOURS BEFORE INTERCOURSE   . timolol (TIMOPTIC) 0.5 % ophthalmic solution PLACE ONE DROP 2 TIMES INTO THE RIGHT EYE UTD   . triamcinolone (KENALOG) 0.025 % ointment APPLY EXTERNALLY TO THE AFFECTED AREA TWICE DAILY   . XTANDI 40 MG capsule Take 160 mg by mouth daily.    No facility-administered encounter medications on file as of 09/10/2020.     Objective:  Lab Results  Component Value Date   CHOL 324 (H) 06/03/2020   HDL 50 06/03/2020   LDLCALC 253 (H) 06/03/2020   TRIG 119 06/03/2020   CHOLHDL 6.5 (H) 06/03/2020   BP Readings from Last 3 Encounters:  06/19/20 (!) 124/91  06/03/20 (!) 129/98  01/21/20 (!) 179/108   Wt Readings from Last 3 Encounters:  06/19/20 188 lb (85.3 kg)  06/03/20 185 lb 8 oz (84.1 kg)  01/21/20 183 lb 11.2 oz (83.3 kg)    Goals Addressed              This Visit's Progress     Patient Stated   .  " What can I do to improve my cholesterol numbers?" (pt-stated)        CARE PLAN ENTRY (see longitudinal plan of care for additional care plan information)  Current Barriers:  . Chronic Disease Management support, education, and care coordination needs related to HTN and HLD- spoke with patient and follow  up assessment completed. He says he is not taking a statin because his insurance company didn't approve it. Patient says he is following the nutritional information that was mailed to him by this CCM RN and continues to drink more water and less sweetened soda, rinsing canned vegetable before he eats them, and has reduced his consumption of packaged sweets.   Clinical Goal(s) related to HTN and HLD:  Over the next 30-60 days, patient will:  . Work with the care management team to address educational, disease management, and care coordination needs  . Begin or continue self health monitoring activities as directed today reduce the amount of sweetened soda  consumption from 1 liter per day to 1/2 liter per day . Call provider office for new or worsened signs and symptoms Chest pain, Shortness of breath, and New or worsened symptom related to HTN  . Call care management team with questions or concerns . Verbalize basic understanding of patient centered plan of care established today  Interventions related to HTN and HLD:  . Evaluation of current treatment plans and patient's adherence to plan as established by provider . Assessed patient understanding of disease states and strategies he has implemented to improve his cholesterol panel . Assessed patient's education and care coordination needs . Left message with patient's pharmacy ([facility] on [address]) asking for direction on how to get patient approved for statin therapy.  . Discussed plans with patient for ongoing care management follow up and provided patient with direct contact information for care management team  Patient Self Care Activities related to HTN and HLD:  . Patient is unable to independently self-manage chronic health conditions  Please see past updates related to this goal by clicking on the "Past Updates" button in the selected goal     .  " What should I eat to help lower my blood pressure" (pt-stated)        CARE PLAN ENTRY (see longitudinal plan of care for additional care plan information)  Objective:  . Last practice recorded BP readings:  BP Readings from Last 3 Encounters:  06/19/20 (!) 124/91  06/03/20 (!) 129/98  01/21/20 (!) 179/108 .   [name] Most recent eGFR/CrCl: No results found for: EGFR  No components found for: CRCL  Current Barriers:  [name] Knowledge Deficits related to basic understanding of hypertension pathophysiology and self care management- patient says he received a home blood pressure monitor in the mail and has been checking his blood pressure twice daily. He reports rather large fluctuations and states most recent BP reading was 129/99, he says he  has reduced his sodium consumption and is rinsing canned vegetables with water before he consumes them, he reports good medication taking behavior in regards to his blood pressure medicines and reports no adverse side effects  Case Manager Clinical Goal(s):  [name] Over the next 30-60 days, patient will verbalize understanding of plan for hypertension management . Over the next 30-60 days, patient will attend all scheduled medical appointments.  Interventions:  . Evaluation of current treatment plan related to hypertension self management and patient's adherence to plan as established by provider. . Provided opportunity for patient to ask questions about the education that was mailed to him related to  stroke prevention, s/s of heart attack and stroke, DASH diet, complications of uncontrolled blood pressure, BP testing, checking BP at home, and Controlling BP via Lifestyle and DASH diet;  . Reviewed medications with patient and assessed medication taking behavior . Verified that patient received small  medication box that was mailed to him . Positive reinforcement given to patient for implementing strategies to improve his blood pressure and for checking his BP twice daily . Reviewed blood pressure targets . Discussed plans with patient for ongoing care management follow up and provided patient with direct contact information for care management team . Reviewed scheduled/upcoming provider appointments including: with podiatrist on 11/12/20  Patient Self Care Activities:  . UNABLE to independently manage HTN to meet treatment targets . Self administers medications as prescribed . Will make follow up clinic appointment for mid November . Attends all scheduled provider appointments . Follows a low sodium diet/DASH diet . Orders home blood pressure monitor via [facility] Medicare Over the Counter items benefits card  Please see past updates related to this goal by clicking on the "Past Updates"  button in the selected goal            Plan:   The care management team will reach out to the patient again over the next 30-60 days.    Kelli Churn RN, CCM, [facility] RN Care Manager [phone]

## 2020-09-10 NOTE — Progress Notes (Signed)
Internal Medicine Clinic Resident  I have personally reviewed this encounter including the documentation in this note and/or discussed this patient with the care management provider. I will address any urgent items identified by the care management provider and will communicate my actions to the patient's PCP. I have reviewed the patient's CCM visit with my supervising attending, Dr Evette Doffing.  We will work alongside CCM to help patient access an affordable lipid-lowering agent.  Foy Guadalajara, MD 09/10/2020

## 2020-09-10 NOTE — Patient Instructions (Signed)
Visit Information It was nice speaking with you today. Goals Addressed              This Visit's Progress     Patient Stated   .  " What can I do to improve my cholesterol numbers?" (pt-stated)        CARE PLAN ENTRY (see longitudinal plan of care for additional care plan information)  Current Barriers:  . Chronic Disease Management support, education, and care coordination needs related to HTN and HLD- spoke with patient and follow up assessment completed. He says he is not taking a statin because his insurance company didn't approve it. Patient says he is following the nutritional information that was mailed to him by this CCM RN and continues to drink more water and less sweetened soda, rinsing canned vegetable before he eats them, and has reduced his consumption of packaged sweets.   Clinical Goal(s) related to HTN and HLD:  Over the next 30-60 days, patient will:  . Work with the care management team to address educational, disease management, and care coordination needs  . Begin or continue self health monitoring activities as directed today reduce the amount of sweetened soda consumption from 1 liter per day to 1/2 liter per day . Call provider office for new or worsened signs and symptoms Chest pain, Shortness of breath, and New or worsened symptom related to HTN  . Call care management team with questions or concerns . Verbalize basic understanding of patient centered plan of care established today  Interventions related to HTN and HLD:  . Evaluation of current treatment plans and patient's adherence to plan as established by provider . Assessed patient understanding of disease states and strategies he has implemented to improve his cholesterol panel . Assessed patient's education and care coordination needs . Left message with patient's pharmacy ([facility] on [address]) asking for direction on how to get patient approved for statin therapy.  . Discussed plans with patient  for ongoing care management follow up and provided patient with direct contact information for care management team  Patient Self Care Activities related to HTN and HLD:  . Patient is unable to independently self-manage chronic health conditions  Please see past updates related to this goal by clicking on the "Past Updates" button in the selected goal     .  " What should I eat to help lower my blood pressure" (pt-stated)        CARE PLAN ENTRY (see longitudinal plan of care for additional care plan information)  Objective:  . Last practice recorded BP readings:  BP Readings from Last 3 Encounters:  06/19/20 (!) 124/91  06/03/20 (!) 129/98  01/21/20 (!) 179/108 .   [name] Most recent eGFR/CrCl: No results found for: EGFR  No components found for: CRCL  Current Barriers:  [name] Knowledge Deficits related to basic understanding of hypertension pathophysiology and self care management- patient says he received a home blood pressure monitor in the mail and has been checking his blood pressure twice daily. He reports rather large fluctuations and states most recent BP reading was 129/99, he says he has reduced his sodium consumption and is rinsing canned vegetables with water before he consumes them, he reports good medication taking behavior in regards to his blood pressure medicines and reports no adverse side effects  Case Manager Clinical Goal(s):  [name] Over the next 30-60 days, patient will verbalize understanding of plan for hypertension management . Over the next 30-60 days, patient will attend all  scheduled medical appointments.  Interventions:  . Evaluation of current treatment plan related to hypertension self management and patient's adherence to plan as established by provider. . Provided opportunity for patient to ask questions about the education that was mailed to him related to  stroke prevention, s/s of heart attack and stroke, DASH diet, complications of uncontrolled blood pressure, BP  testing, checking BP at home, and Controlling BP via Lifestyle and DASH diet;  . Reviewed medications with patient and assessed medication taking behavior . Verified that patient received small medication box that was mailed to him . Positive reinforcement given to patient for implementing strategies to improve his blood pressure and for checking his BP twice daily . Reviewed blood pressure targets . Discussed plans with patient for ongoing care management follow up and provided patient with direct contact information for care management team . Reviewed scheduled/upcoming provider appointments including: with podiatrist on 11/12/20  Patient Self Care Activities:  . UNABLE to independently manage HTN to meet treatment targets . Self administers medications as prescribed . Will make follow up clinic appointment for mid November . Attends all scheduled provider appointments . Follows a low sodium diet/DASH diet . Orders home blood pressure monitor via [facility] Medicare Over the Counter items benefits card  Please see past updates related to this goal by clicking on the "Past Updates" button in the selected goal          The patient verbalized understanding of instructions, educational materials, and care plan provided today and declined offer to receive copy of patient instructions, educational materials, and care plan.   The care management team will reach out to the patient again over the next 30-60 days.   Kelli Churn RN, CCM, [facility] RN Care Manager [phone]

## 2020-09-11 ENCOUNTER — Ambulatory Visit: Payer: Medicare Other | Admitting: *Deleted

## 2020-09-11 DIAGNOSIS — E782 Mixed hyperlipidemia: Secondary | ICD-10-CM

## 2020-09-11 DIAGNOSIS — I10 Essential (primary) hypertension: Secondary | ICD-10-CM

## 2020-09-11 NOTE — Progress Notes (Signed)
Internal Medicine Clinic Resident  I have personally reviewed this encounter including the documentation in this note and/or discussed this patient with the care management provider. I will address any urgent items identified by the care management provider and will communicate my actions to the patient's PCP. I have reviewed the patient's CCM visit with my supervising attending, Dr Evette Doffing.  Foy [place], MD 09/11/2020

## 2020-09-11 NOTE — Chronic Care Management (AMB) (Addendum)
Chronic Care Management   Follow Up Note   09/11/2020 Name: William Jones MRN: [id number] DOB: August 17, 1951  Referred by: Harvie Heck, MD Reason for referral : Chronic Care Management (HTN, HLD, Prostate Ca with mets to bone)   William Jones is a 69 y.o. year old male who is a primary care patient of Aslam, [name], MD. [name] was consulted for assistance with chronic disease management and care coordination needs.    Review of patient status, including review of consultants reports, relevant laboratory and other test results, and collaboration with appropriate care team members and the patient's provider was performed as part of comprehensive patient evaluation and provision of chronic care management services.    SDOH (Social Determinants of Health) assessments performed: No See Care Plan activities for detailed interventions related to [facility])     Outpatient Encounter Medications as of 09/11/2020  Medication Sig Note  . amLODipine (NORVASC) 2.5 MG tablet Take 1 tablet (2.5 mg total) by mouth daily.   [name] atorvastatin (LIPITOR) 40 MG tablet Take 1 tablet (40 mg total) by mouth daily. 09/11/20 able to get refill as of today but has not taken for last 2 months due to pharmacy dispensary issue  . BETIMOL 0.5 % ophthalmic solution INT 1 GTT IN OD BID   . brimonidine (ALPHAGAN) 0.2 % ophthalmic solution INT 1 GTT INTO THE RIGHT EYE BID   . cholecalciferol (VITAMIN D3) 10 MCG (400 UNIT) TABS tablet Take 10,000 Units by mouth daily.   . dorzolamide (TRUSOPT) 2 % ophthalmic solution INT 1 GTT IN OD BID   . ferrous sulfate 325 (65 FE) MG tablet Take 1 tablet (325 mg total) by mouth daily with breakfast.   . hydrochlorothiazide (HYDRODIURIL) 25 MG tablet TAKE 1 TABLET(25 MG) BY MOUTH DAILY   . ofloxacin (OCUFLOX) 0.3 % ophthalmic solution    . prednisoLONE acetate (PRED FORTE) 1 % ophthalmic suspension    . sildenafil (VIAGRA) 100 MG tablet TAKE 1 TABLET BY MOUTH 1 HOURS BEFORE  INTERCOURSE   . timolol (TIMOPTIC) 0.5 % ophthalmic solution PLACE ONE DROP 2 TIMES INTO THE RIGHT EYE UTD   . triamcinolone (KENALOG) 0.025 % ointment APPLY EXTERNALLY TO THE AFFECTED AREA TWICE DAILY   . XTANDI 40 MG capsule Take 160 mg by mouth daily.    No facility-administered encounter medications on file as of 09/11/2020.     Objective:  Lab Results  Component Value Date   CHOL 324 (H) 06/03/2020   HDL 50 06/03/2020   LDLCALC 253 (H) 06/03/2020   TRIG 119 06/03/2020   CHOLHDL 6.5 (H) 06/03/2020  Advised patient he can pick up atorvastatin refill today but he has not been taking it for 2 months due to pharmacy dispensing issue.   Goals Addressed              This Visit's Progress     Patient Stated   .  " What can I do to improve my cholesterol numbers?" (pt-stated)        CARE PLAN ENTRY (see longitudinal plan of care for additional care plan information)  Current Barriers:  . Chronic Disease Management support, education, and care coordination needs related to HTN and HLD- after speaking with patient, it was determined he received 30 day supply of Atorvastatin on 06/19/20 not a 90 day supply as per Roderic Palau the pharmacist at [facility], Patient has the bottle with the fill date of 06/19/20 with quantity of 30. Advised patient that per  Roderic Palau the [employer] pharmacist , he can pick up refill today and that he should take the bottle to show the pharmacy staff that he only received a 30 day supply. Patient voiced appreciation for CCM RN's assist in securing atorvastatin refills. Patient says he is following the nutritional information that was mailed to him by this CCM RN and continues to drink more water and less sweetened soda, rinsing canned vegetable before he eats them, and has reduced his consumption of packaged sweets.   Clinical Goal(s) related to HTN and HLD:  Over the next 30-60 days, patient will:  . Work with the care management team to address educational, disease  management, and care coordination needs  . Begin or continue self health monitoring activities as directed today reduce the amount of sweetened soda consumption from 1 liter per day to 1/2 liter per day . Call provider office for new or worsened signs and symptoms Chest pain, Shortness of breath, and New or worsened symptom related to HTN  . Call care management team with questions or concerns . Verbalize basic understanding of patient centered plan of care established today  Interventions related to HTN and HLD:  . Evaluation of current treatment plans and patient's adherence to plan as established by provider . Assessed patient understanding of disease states and strategies he has implemented to improve his cholesterol panel . Assessed patient's education and care coordination needs . Left message with patient's pharmacy ([facility] on [address]) asking for direction on how to get patient approved for statin therapy.  . 09/11/20 Received return message from [place]. Returned call to Roderic Palau stating patient did not receive 90 atorvastatin on 06/19/20 only 30. Roderic Palau advised patient can pick up his refill today and that he will reach out to the patient.  . 09/11/20 This CCM RN called patient and explained why he has not been able to get the atorvastatin refills and advised him the refill will be ready today.  . Discussed plans with patient for ongoing care management follow up and provided patient with direct contact information for care management team  Patient Self Care Activities related to HTN and HLD:  . Patient is unable to independently self-manage chronic health conditions  Please see past updates related to this goal by clicking on the "Past Updates" button in the selected goal          Plan:   The care management team will reach out to the patient again over the next 30-60 days.    Kelli Churn RN, CCM, [name] RN Care Manager [phone]

## 2020-09-12 NOTE — Progress Notes (Signed)
Internal Medicine Clinic Attending  CCM services provided by the care management provider and their documentation were discussed with Dr.  Wynetta Emery . We reviewed the pertinent findings, urgent action items addressed by the resident and non-urgent items to be addressed by the PCP.  I agree with the assessment, diagnosis, and plan of care documented in the CCM and resident's note.  Axel Filler, MD 09/12/2020

## 2020-10-14 ENCOUNTER — Ambulatory Visit: Payer: Medicare Other | Admitting: *Deleted

## 2020-10-14 DIAGNOSIS — I10 Essential (primary) hypertension: Secondary | ICD-10-CM

## 2020-10-14 DIAGNOSIS — K645 Perianal venous thrombosis: Secondary | ICD-10-CM | POA: Insufficient documentation

## 2020-10-14 DIAGNOSIS — K5521 Angiodysplasia of colon with hemorrhage: Secondary | ICD-10-CM

## 2020-10-14 DIAGNOSIS — C61 Malignant neoplasm of prostate: Secondary | ICD-10-CM

## 2020-10-14 DIAGNOSIS — E782 Mixed hyperlipidemia: Secondary | ICD-10-CM

## 2020-10-14 DIAGNOSIS — K625 Hemorrhage of anus and rectum: Secondary | ICD-10-CM

## 2020-10-14 DIAGNOSIS — K640 First degree hemorrhoids: Secondary | ICD-10-CM | POA: Insufficient documentation

## 2020-10-14 HISTORY — DX: Hemorrhage of anus and rectum: K62.5

## 2020-10-14 HISTORY — DX: Angiodysplasia of colon with hemorrhage: K55.21

## 2020-10-14 HISTORY — DX: Malignant neoplasm of prostate: C61

## 2020-10-14 NOTE — Chronic Care Management (AMB) (Signed)
Chronic Care Management   CCM RN Visit Note  10/14/2020 Name: William Jones MRN: [id number] DOB: 1950-10-26  Subjective: William Jones is a 70 y.o. year old male who is a primary care patient of Aslam, [name], MD. [name] was consulted for assistance with chronic disease management and care coordination needs.    Engaged with patient by telephone for follow up visit in response to provider referral for pharmacy case management and/or care coordination services.   Consent to Services:  The patient was given information about Chronic Care Management services, agreed to services, and gave verbal consent prior to initiation of services.  Please see initial visit note for detailed documentation.   Patient agreed to services and verbal consent obtained.   Assessment/Interventions: Review of patient past medical history, allergies, medications, health status, including review of consultants reports, laboratory and other test data, was performed as part of comprehensive evaluation and provision of chronic care management services.   SDOH (Social Determinants of Health) assessments and interventions performed:    CCM Care Plan  No Known Allergies  Outpatient Encounter Medications as of 10/14/2020  Medication Sig Note  . amLODipine (NORVASC) 2.5 MG tablet Take 1 tablet (2.5 mg total) by mouth daily.   [name] atorvastatin (LIPITOR) 40 MG tablet Take 1 tablet (40 mg total) by mouth daily. 09/11/2020: Patient advised he can get refill of Atorvastatin today 09/11/20  . BETIMOL 0.5 % ophthalmic solution INT 1 GTT IN OD BID   . brimonidine (ALPHAGAN) 0.2 % ophthalmic solution INT 1 GTT INTO THE RIGHT EYE BID   . cholecalciferol (VITAMIN D3) 10 MCG (400 UNIT) TABS tablet Take 10,000 Units by mouth daily.   . dorzolamide (TRUSOPT) 2 % ophthalmic solution INT 1 GTT IN OD BID   . ferrous sulfate 325 (65 FE) MG tablet Take 1 tablet (325 mg total) by mouth daily with breakfast.   .  hydrochlorothiazide (HYDRODIURIL) 25 MG tablet TAKE 1 TABLET(25 MG) BY MOUTH DAILY   . ofloxacin (OCUFLOX) 0.3 % ophthalmic solution    . prednisoLONE acetate (PRED FORTE) 1 % ophthalmic suspension    . sildenafil (VIAGRA) 100 MG tablet TAKE 1 TABLET BY MOUTH 1 HOURS BEFORE INTERCOURSE   . timolol (TIMOPTIC) 0.5 % ophthalmic solution PLACE ONE DROP 2 TIMES INTO THE RIGHT EYE UTD   . triamcinolone (KENALOG) 0.025 % ointment APPLY EXTERNALLY TO THE AFFECTED AREA TWICE DAILY   . XTANDI 40 MG capsule Take 160 mg by mouth daily.    No facility-administered encounter medications on file as of 10/14/2020.    Patient Active Problem List   Diagnosis Date Noted  . Angiodysplasia of colon with hemorrhage 10/14/2020  . First degree hemorrhoids 10/14/2020  . Hemorrhage of rectum and anus 10/14/2020  . Malignant tumor of prostate ([place]) 10/14/2020  . Thrombosed hemorrhoids 10/14/2020  . Prediabetes 12/18/2019  . Heart murmur 12/18/2019  . Hyperlipidemia 11/20/2019  . Knee pain 11/06/2019  . Prostate cancer metastatic to bone ([place]) 12/29/2018  . Glaucoma 08/21/2018  . Leukopenia 04/20/2017  . Rectal mass 09/15/2016  . Healthcare maintenance 06/23/2016  . Essential hypertension 07/16/2015  . Erectile dysfunction 01/23/2014  . Iron deficiency anemia 01/08/2014    Conditions to be addressed/monitored:HTN, HLD, Prostate Ca with mets to bone, glaucoma  Patient Care Plan: CCM RN Hypertension (Adult)    Problem Identified: Hypertension (Hypertension)     Long-Range Goal: Hypertension Monitored and Managed to meet treatment targets of <140/<90   Start Date: 06/25/2020  Expected End Date: 07/03/2021  This Visit's Progress: On track  Priority: High  Note:   see longitudinal plan of care for additional care plan information)  Objective:  . Last practice recorded BP readings:  BP Readings from Last 3 Encounters:  06/19/20 (!) 124/91  06/03/20 (!) 129/98  01/21/20 (!) 179/108 .   [name] Most recent  eGFR/CrCl: No results found for: EGFR  No components found for: CRCL  Current Barriers:  [name] Knowledge Deficits related to basic understanding of hypertension pathophysiology and self care management- successful outreach to patient to complete follow up assessment, patient reports self monitored blood pressure meeting target for systolic but not diastolic as all diastolic numbers are > 937, he states he consumed more salt due to the holidays, he reports good medication taking behavior in regards to his blood pressure medicines and reports no adverse side effects  Case Manager Clinical Goal(s):  [name] Over the next 30-60 days, patient will verbalize understanding of plan for hypertension management . Over the next 30-60 days, patient will attend all scheduled medical appointments.  Interventions:  . Evaluation of current treatment plan related to hypertension self management and patient's adherence to plan as established by provider. . Reviewed lifestyle strategies to lower blood pressure . Reviewed medications with patient and assessed medication taking behavior . Positive reinforcement given to patient for implementing strategies to improve his blood pressure and for checking his BP twice daily . Reviewed self monitored blood pressure readings and BP targets . Advised patient for is overdue for clinic follow up so requested he call clinic and make follow up appointment for blood pressure check and medication adjustment with goal of meeting diastolic BP target . Discussed plans with patient for ongoing care management follow up and provided patient with direct contact information for care management team . Reviewed scheduled/upcoming provider appointments including: with podiatrist on 11/12/20  Patient Self Care Activities:  . UNABLE to independently manage HTN to meet treatment targets . Self administers medications as prescribed . Will make follow up clinic appointment ASAP since DBP not meeting  treatment target . Attends all scheduled provider appointments . Follows a low sodium diet/DASH diet . - check blood pressure at least daily . - choose a place to take my blood pressure (home, clinic or office, retail store) . - write blood pressure results in a log or diary   Please see past updates related to this goal by clicking on the "Past Updates" button in the selected goal     Patient Care Plan: CCM RN [place] of Care (Adult)    Problem Identified: Health Promotion or Disease Self-Management (General Plan of Care) for HLD     Long-Range Goal: Manage lipids to meet treatment targets   Start Date: 06/25/2020  Expected End Date: 12/31/2020  This Visit's Progress: On track  Priority: Medium  Note:   CARE PLAN ENTRY (see longitudinal plan of care for additional care plan information)  Current Barriers:  . Chronic Disease Management support, education, and care coordination needs related to HLD- Patient says he is following the nutritional information that was mailed to him by this CCM RN and continues to drink more water and less sweetened and has reduced his consumption of packaged sweets although he admits to dietary discretion during the holidays,  reports no further issues with getting his statin refills and he reports  good medication taking behavior in relation to statin and reports no adverse side effects,   Clinical Goal(s) related  to  HLD:  Over the next 30-60 days, patient will:  . Work with the care management team to address educational, disease management, and care coordination needs  . Begin or continue self health monitoring activities as directed today reduce the amount of sweetened soda consumption from 1 liter per day to 1/2 liter per day . Call provider office for new problems and maintain schedule of regular follow up appointments  . Call care management team with questions or concerns . Verbalize basic understanding of patient centered plan of care  established today  Interventions related to HLD:  . Evaluation of current treatment plans and patient's adherence to plan as established by provider . Assessed patient understanding of disease states and strategies he has implemented to improve his cholesterol panel . Assessed statin medication taking behavior . Assessed statin tolerance . Encouraged patient to make follow up clinic appointment for fasting lipid recheck and/or LFT's, advised him to make morning appointment and fast prior to the appointment  . Assessed patient's education and care coordination needs . Discussed plans with patient for ongoing care management follow up and ensured patient has direct contact information for care management team  Patient Self Care Activities related to HLD:  . Patient is unable to independently self-manage chronic health conditions . - change to whole grain breads, cereal, pasta . - eat smaller or less servings of red meat . - fill half the plate with nonstarchy vegetables . - get blood test (fasting) done 1 week before next visit . - increase the amount of fiber in food . - read food labels for fat and fiber  Please see past updates related to this goal by clicking on the "Past Updates" button in the selected goal      Plan:The care management team will reach out to the patient again over the next 30-60 days.  Kelli Churn RN, CCM, [facility] RN Care Manager [phone]

## 2020-10-14 NOTE — Patient Instructions (Signed)
Visit Information It was nice speaking with you today.  Patient Care Plan: CCM RN Hypertension (Adult)    Problem Identified: Hypertension (Hypertension)     Long-Range Goal: Hypertension Monitored and Managed to meet treatment targets of <140/<90   Start Date: 06/25/2020  Expected End Date: 07/03/2021  This Visit's Progress: On track  Priority: High  Note:   see longitudinal plan of care for additional care plan information)  Objective:  . Last practice recorded BP readings:  BP Readings from Last 3 Encounters:  06/19/20 (!) 124/91  06/03/20 (!) 129/98  01/21/20 (!) 179/108 .   [name] Most recent eGFR/CrCl: No results found for: EGFR  No components found for: CRCL  Current Barriers:  [name] Knowledge Deficits related to basic understanding of hypertension pathophysiology and self care management- successful outreach to patient to complete follow up assessment, patient reports self monitored blood pressure meeting target for systolic but not diastolic as all diastolic numbers are > 947, he states he consumed more salt due to the holidays, he reports good medication taking behavior in regards to his blood pressure medicines and reports no adverse side effects  Case Manager Clinical Goal(s):  [name] Over the next 30-60 days, patient will verbalize understanding of plan for hypertension management . Over the next 30-60 days, patient will attend all scheduled medical appointments.  Interventions:  . Evaluation of current treatment plan related to hypertension self management and patient's adherence to plan as established by provider. . Reviewed lifestyle strategies to lower blood pressure . Reviewed medications with patient and assessed medication taking behavior . Positive reinforcement given to patient for implementing strategies to improve his blood pressure and for checking his BP twice daily . Reviewed self monitored blood pressure readings and BP targets . Advised patient for is overdue for  clinic follow up so requested he call clinic and make follow up appointment for blood pressure check and medication adjustment with goal of meeting diastolic BP target . Discussed plans with patient for ongoing care management follow up and provided patient with direct contact information for care management team . Reviewed scheduled/upcoming provider appointments including: with podiatrist on 11/12/20  Patient Self Care Activities:  . UNABLE to independently manage HTN to meet treatment targets . Self administers medications as prescribed . Will make follow up clinic appointment ASAP since DBP not meeting treatment target . Attends all scheduled provider appointments . Follows a low sodium diet/DASH diet . - check blood pressure at least daily . - choose a place to take my blood pressure (home, clinic or office, retail store) . - write blood pressure results in a log or diary   Please see past updates related to this goal by clicking on the "Past Updates" button in the selected goal     Patient Care Plan: CCM RN [place] of Care (Adult)    Problem Identified: Health Promotion or Disease Self-Management (General Plan of Care) for HLD     Long-Range Goal: Manage lipids to meet treatment targets   Start Date: 06/25/2020  Expected End Date: 12/31/2020  This Visit's Progress: On track  Priority: Medium  Note:   CARE PLAN ENTRY (see longitudinal plan of care for additional care plan information)  Current Barriers:  . Chronic Disease Management support, education, and care coordination needs related to HLD- Patient says he is following the nutritional information that was mailed to him by this CCM RN and continues to drink more water and less sweetened and has reduced his consumption  of packaged sweets although he admits to dietary discretion during the holidays,  reports no further issues with getting his statin refills and he reports  good medication taking behavior in relation to  statin and reports no adverse side effects,   Clinical Goal(s) related to  HLD:  Over the next 30-60 days, patient will:  . Work with the care management team to address educational, disease management, and care coordination needs  . Begin or continue self health monitoring activities as directed today reduce the amount of sweetened soda consumption from 1 liter per day to 1/2 liter per day . Call provider office for new problems and maintain schedule of regular follow up appointments  . Call care management team with questions or concerns . Verbalize basic understanding of patient centered plan of care established today  Interventions related to HLD:  . Evaluation of current treatment plans and patient's adherence to plan as established by provider . Assessed patient understanding of disease states and strategies he has implemented to improve his cholesterol panel . Assessed statin medication taking behavior . Assessed statin tolerance . Encouraged patient to make follow up clinic appointment for fasting lipid recheck and/or LFT's, advised him to make morning appointment and fast prior to the appointment  . Assessed patient's education and care coordination needs . Discussed plans with patient for ongoing care management follow up and ensured patient has direct contact information for care management team  Patient Self Care Activities related to HLD:  . Patient is unable to independently self-manage chronic health conditions . - change to whole grain breads, cereal, pasta . - eat smaller or less servings of red meat . - fill half the plate with nonstarchy vegetables . - get blood test (fasting) done 1 week before next visit . - increase the amount of fiber in food . - read food labels for fat and fiber  Please see past updates related to this goal by clicking on the "Past Updates" button in the selected goal      The patient verbalized understanding of instructions, educational  materials, and care plan provided today and declined offer to receive copy of patient instructions, educational materials, and care plan.   The care management team will reach out to the patient again over the next 30-60 days.     RN, CCM, [facility] CCM Clinic RN Care Manager [phone]  

## 2020-10-15 NOTE — Progress Notes (Signed)
Internal Medicine Clinic Resident  I have personally reviewed this encounter including the documentation in this note and/or discussed this patient with the care management provider. I will address any urgent items identified by the care management provider and will communicate my actions to the patient's PCP. I have reviewed the patient's CCM visit with my supervising attending, Dr Narendra.  Jaret Coppedge, MD  [facility] PGY-2 10/15/2020    

## 2020-10-17 NOTE — Progress Notes (Signed)
Internal Medicine Clinic Attending  CCM services provided by the care management provider and their documentation were discussed with Dr. Aslam. We reviewed the pertinent findings, urgent action items addressed by the resident and non-urgent items to be addressed by the PCP.  I agree with the assessment, diagnosis, and plan of care documented in the CCM and resident's note.  Milynn Quirion, MD 10/17/2020  

## 2020-10-22 ENCOUNTER — Encounter: Payer: Medicare Other | Admitting: Internal Medicine

## 2020-10-28 ENCOUNTER — Encounter: Payer: Self-pay | Admitting: Internal Medicine

## 2020-10-28 ENCOUNTER — Other Ambulatory Visit: Payer: Self-pay

## 2020-10-28 ENCOUNTER — Ambulatory Visit (INDEPENDENT_AMBULATORY_CARE_PROVIDER_SITE_OTHER): Payer: Medicare Other | Admitting: Internal Medicine

## 2020-10-28 VITALS — BP 135/96 | HR 88 | Temp 98.0°F | Ht 69.0 in | Wt 187.5 lb

## 2020-10-28 DIAGNOSIS — R5383 Other fatigue: Secondary | ICD-10-CM

## 2020-10-28 DIAGNOSIS — I10 Essential (primary) hypertension: Secondary | ICD-10-CM

## 2020-10-28 DIAGNOSIS — R011 Cardiac murmur, unspecified: Secondary | ICD-10-CM

## 2020-10-28 DIAGNOSIS — C61 Malignant neoplasm of prostate: Secondary | ICD-10-CM | POA: Diagnosis not present

## 2020-10-28 DIAGNOSIS — C7951 Secondary malignant neoplasm of bone: Secondary | ICD-10-CM

## 2020-10-28 DIAGNOSIS — E119 Type 2 diabetes mellitus without complications: Secondary | ICD-10-CM

## 2020-10-28 DIAGNOSIS — D509 Iron deficiency anemia, unspecified: Secondary | ICD-10-CM | POA: Diagnosis not present

## 2020-10-28 LAB — POC HEMOCCULT BLD/STL (OFFICE/1-CARD/DIAGNOSTIC): Fecal Occult Blood, POC: NEGATIVE

## 2020-10-28 MED ORDER — AMLODIPINE BESYLATE 5 MG PO TABS: 5.0000 mg | ORAL_TABLET | Freq: Every day | ORAL | 3 refills | Status: DC

## 2020-10-28 NOTE — Progress Notes (Signed)
   CC: f/u hypertension, fatigue   HPI:  [name] is a 70 y.o. male with PMhx as listed below presenting for follow up of his hypertension. He endorses fatigue for several months. He denies any dizziness/lightheadedness, chest pain, palpitations, dyspnea on exertion, or focal weakness. He notes chronic dark stools from taking his iron supplements. He does endorse intermittent episodes of hematochezia which he believes is related to eating more "green" foods. Last episode of hematochezia was 2-3 weeks ago and lasted for few days before spontaneously resolving. No further episodes since then. Please see problem based charting for complete assessment and plan.  Past Medical History:  Diagnosis Date  . Angiodysplasia of colon with hemorrhage 10/14/2020  . Duodenitis   . Essential hypertension 07/16/2015  . Glaucoma   . H/O: substance abuse ([place])   . Hemorrhage of rectum and anus 10/14/2020  . History of frostbite   . History of prostate cancer 03/20/2014   Lost to f/u with [facility] in the past 2011 with elevated PSA >30 at that time.  Saw [facility] (Dr. Risa Grill) on 03/13/14. Cancer Noted 03/13/14 office visit. Gleason score 7. Plan CT Ab/pelvis with contrast, bone scan   . History of radiation therapy nov 2015 to feb 2016  . Hyperlipidemia 11/20/2019  . Iron deficiency anemia   . Malignant tumor of prostate ([place]) 10/14/2020  . Peripheral neuropathy   . Prostate cancer ([place]) 03/05/14   Gleason 7, volume 45 gm  . Rash and nonspecific skin eruption 08/04/2017  . S/P radiation therapy 09/18/14-11/15/14   prostate/ 7800Gy/40sessions  . Thrombocytopenia ([place])    Review of Systems:  Negative except as stated in HPI.  Physical Exam:  Vitals:   10/28/20 0907 10/28/20 0936  BP: (!) 135/99 (!) 135/96  Pulse: 96 88  Temp: 98 F (36.7 C)   TempSrc: Oral   SpO2: 100%   Weight: 187 lb 8 oz (85 kg)   Height: 5\' 9"  (1.753 m)    Physical Exam  Constitutional: Appears  well-developed and well-nourished. No distress.  HENT: Normocephalic and atraumatic, EOMI, conjunctiva normal, moist mucous membranes Cardiovascular: Normal rate, regular rhythm, S1 and S2 present, systolic murmur, no rubs, gallops.  Distal pulses intact Respiratory: No respiratory distress, no accessory muscle use.  Effort is normal.  Lungs are clear to auscultation bilaterally. GI: Nondistended, soft, nontender to palpation, normal active bowel sounds Rectal: normal tone; nonthrombosed external hemorrhoid noted, no active bleeding noted Musculoskeletal: Normal bulk and tone.  No peripheral edema noted. Neurological: Is alert and oriented x4, no apparent focal deficits noted. Skin: Warm and dry.  No rash, erythema, lesions noted. Psychiatric: Normal mood and affect. Behavior is normal. Judgment and thought content normal.    Assessment & Plan:   See Encounters Tab for problem based charting.  Patient discussed with Dr. Philipp Ovens

## 2020-10-28 NOTE — Assessment & Plan Note (Addendum)
Followed by Dr Tammi Klippel. He is currently on Xtandi 160mg  daily. Notes that he is also getting shots to "keep bones strong". Notes follow up appointment on 2/1.

## 2020-10-28 NOTE — Patient Instructions (Addendum)
William Jones,  It was a pleasure seeing you in clinic. Today we discussed:   Blood pressure: At this time, I will increase your amlodipine dosing to 5mg  daily. Please continue taking your chlorthalidone as prescribed.   Fatigue: I am checking blood work and sending referral for you to be seen by Dr Benson Norway. I am also ordering an Echo (ultrasound of your heart). Please schedule this at your earliest convenience.  Diabetes: Please limit your sugar intake. Please consider starting medication for this to prevent any future complications.    If you have any questions or concerns, please call our clinic at [phone] between 9am-5pm and after hours call [phone] and ask for the internal medicine resident on call. If you feel you are having a medical emergency please call 911.   Thank you, we look forward to helping you remain healthy!  If you have not already done so, I would recommend getting the booster vaccine.  To schedule an appointment for a COVID vaccine or be added to the vaccine wait list: Go to [email or web address]   OR Go to [email or web address]                  OR Call [phone]                                     OR Call [phone] and select Option 2    Diabetes Mellitus and Nutrition, Adult When you have diabetes, or diabetes mellitus, it is very important to have healthy eating habits because your blood sugar (glucose) levels are greatly affected by what you eat and drink. Eating healthy foods in the right amounts, at about the same times every day, can help you:  Control your blood glucose.  Lower your risk of heart disease.  Improve your blood pressure.  Reach or maintain a healthy weight. What can affect my meal plan? Every person with diabetes is different, and each person has different needs for a meal plan. Your health care provider may recommend that you work with a dietitian to make a meal plan that is best for you. Your meal plan may  vary depending on factors such as:  The calories you need.  The medicines you take.  Your weight.  Your blood glucose, blood pressure, and cholesterol levels.  Your activity level.  Other health conditions you have, such as heart or kidney disease. How do carbohydrates affect me? Carbohydrates, also called carbs, affect your blood glucose level more than any other type of food. Eating carbs naturally raises the amount of glucose in your blood. Carb counting is a method for keeping track of how many carbs you eat. Counting carbs is important to keep your blood glucose at a healthy level, especially if you use insulin or take certain oral diabetes medicines. It is important to know how many carbs you can safely have in each meal. This is different for every person. Your dietitian can help you calculate how many carbs you should have at each meal and for each snack. How does alcohol affect me? Alcohol can cause a sudden decrease in blood glucose (hypoglycemia), especially if you use insulin or take certain oral diabetes medicines. Hypoglycemia can be a life-threatening condition. Symptoms of hypoglycemia, such as sleepiness, dizziness, and confusion, are similar to symptoms of having too much alcohol.  Do not drink alcohol if: ? Your health care provider tells you not to drink. ?  You are pregnant, may be pregnant, or are planning to become pregnant.  If you drink alcohol: ? Do not drink on an empty stomach. ? Limit how much you use to:  0-1 drink a day for women.  0-2 drinks a day for men. ? Be aware of how much alcohol is in your drink. In the [place] one drink equals one 12 oz bottle of beer (355 mL), one 5 oz glass of wine (148 mL), or one 1 oz glass of hard liquor (44 mL). ? Keep yourself hydrated with water, diet soda, or unsweetened iced tea.  Keep in mind that regular soda, juice, and other mixers may contain a lot of sugar and must be counted as carbs. What are tips for  following this plan? Reading food labels  Start by checking the serving size on the "Nutrition Facts" label of packaged foods and drinks. The amount of calories, carbs, fats, and other nutrients listed on the label is based on one serving of the item. Many items contain more than one serving per package.  Check the total grams (g) of carbs in one serving. You can calculate the number of servings of carbs in one serving by dividing the total carbs by 15. For example, if a food has 30 g of total carbs per serving, it would be equal to 2 servings of carbs.  Check the number of grams (g) of saturated fats and trans fats in one serving. Choose foods that have a low amount or none of these fats.  Check the number of milligrams (mg) of salt (sodium) in one serving. Most people should limit total sodium intake to less than 2,300 mg per day.  Always check the nutrition information of foods labeled as "low-fat" or "nonfat." These foods may be higher in added sugar or refined carbs and should be avoided.  Talk to your dietitian to identify your daily goals for nutrients listed on the label. Shopping  Avoid buying canned, pre-made, or processed foods. These foods tend to be high in fat, sodium, and added sugar.  Shop around the outside edge of the grocery store. This is where you will most often find fresh fruits and vegetables, bulk grains, fresh meats, and fresh dairy. Cooking  Use low-heat cooking methods, such as baking, instead of high-heat cooking methods like deep frying.  Cook using healthy oils, such as olive, canola, or sunflower oil.  Avoid cooking with butter, cream, or high-fat meats. Meal planning  Eat meals and snacks regularly, preferably at the same times every day. Avoid going long periods of time without eating.  Eat foods that are high in fiber, such as fresh fruits, vegetables, beans, and whole grains. Talk with your dietitian about how many servings of carbs you can eat at  each meal.  Eat 4-6 oz (112-168 g) of lean protein each day, such as lean meat, chicken, fish, eggs, or tofu. One ounce (oz) of lean protein is equal to: ? 1 oz (28 g) of meat, chicken, or fish. ? 1 egg. ?  cup (62 g) of tofu.  Eat some foods each day that contain healthy fats, such as avocado, nuts, seeds, and fish.   What foods should I eat? Fruits Berries. Apples. Oranges. Peaches. Apricots. Plums. Grapes. Mango. Papaya. Pomegranate. Kiwi. Cherries. Vegetables Lettuce. Spinach. Leafy greens, including kale, chard, collard greens, and mustard greens. Beets. Cauliflower. Cabbage. Broccoli. Carrots. Green beans. Tomatoes. Peppers. Onions. Cucumbers. Brussels sprouts. Grains Whole grains, such as whole-wheat or whole-grain bread, crackers,  tortillas, cereal, and pasta. Unsweetened oatmeal. Quinoa. Brown or wild rice. Meats and other proteins Seafood. Poultry without skin. Lean cuts of poultry and beef. Tofu. Nuts. Seeds. Dairy Low-fat or fat-free dairy products such as milk, yogurt, and cheese. The items listed above may not be a complete list of foods and beverages you can eat. Contact a dietitian for more information. What foods should I avoid? Fruits Fruits canned with syrup. Vegetables Canned vegetables. Frozen vegetables with butter or cream sauce. Grains Refined white flour and flour products such as bread, pasta, snack foods, and cereals. Avoid all processed foods. Meats and other proteins Fatty cuts of meat. Poultry with skin. Breaded or fried meats. Processed meat. Avoid saturated fats. Dairy Full-fat yogurt, cheese, or milk. Beverages Sweetened drinks, such as soda or iced tea. The items listed above may not be a complete list of foods and beverages you should avoid. Contact a dietitian for more information. Questions to ask a health care provider  Do I need to meet with a diabetes educator?  Do I need to meet with a dietitian?  What number can I call if I have  questions?  When are the best times to check my blood glucose? Where to find more information:  American Diabetes Association: diabetes.org  Academy of Nutrition and Dietetics: www.eatright.[employer] of Diabetes and Digestive and Kidney Diseases: [employer]  Association of Diabetes Care and Education Specialists: www.diabeteseducator.org Summary  It is important to have healthy eating habits because your blood sugar (glucose) levels are greatly affected by what you eat and drink.  A healthy meal plan will help you control your blood glucose and maintain a healthy lifestyle.  Your health care provider may recommend that you work with a dietitian to make a meal plan that is best for you.  Keep in mind that carbohydrates (carbs) and alcohol have immediate effects on your blood glucose levels. It is important to count carbs and to use alcohol carefully. This information is not intended to replace advice given to you by your health care provider. Make sure you discuss any questions you have with your health care provider. Document Revised: 08/28/2019 Document Reviewed: 08/28/2019 Elsevier Patient Education  2021 [employer].

## 2020-10-29 LAB — CBC
Hematocrit: 34.5 % — ABNORMAL LOW (ref 37.5–51.0)
Hemoglobin: 11 g/dL — ABNORMAL LOW (ref 13.0–17.7)
MCH: 27.5 pg (ref 26.6–33.0)
MCHC: 31.9 g/dL (ref 31.5–35.7)
MCV: 86 fL (ref 79–97)
Platelets: 356 10*3/uL (ref 150–450)
RBC: 4 x10E6/uL — ABNORMAL LOW (ref 4.14–5.80)
RDW: 13.8 % (ref 11.6–15.4)
WBC: 2.9 10*3/uL — ABNORMAL LOW (ref 3.4–10.8)

## 2020-10-29 LAB — IRON,TIBC AND FERRITIN PANEL
Ferritin: 102 ng/mL (ref 30–400)
Iron Saturation: 19 % (ref 15–55)
Iron: 67 ug/dL (ref 38–169)
Total Iron Binding Capacity: 350 ug/dL (ref 250–450)
UIBC: 283 ug/dL (ref 111–343)

## 2020-11-06 DIAGNOSIS — R5383 Other fatigue: Secondary | ICD-10-CM

## 2020-11-06 HISTORY — DX: Other fatigue: R53.83

## 2020-11-06 NOTE — Assessment & Plan Note (Signed)
Most recent A1c 6.6.  Discussed with patient recommendation of starting Metformin.  He is very hesitant at this time to start new medication and would prefer to start with dietary modifications at this time. I stressed the importance of diabetic control given his history of hypertension.  Patient expresses understanding but would like to continue with dietary modification at this time.  Plan Encouraged carb modified diet Follow-up with A1c in 3 months

## 2020-11-06 NOTE — Assessment & Plan Note (Signed)
BP Readings from Last 3 Encounters:  10/28/20 (!) 135/96  06/19/20 (!) 124/91  06/03/20 (!) 129/98   William Jones has a history of hypertension for which he is on HCTZ 25mg  daily and amlodipine 2.5mg  daily. He is compliant with medications and denies any side effects at this time. BP slightly above goal at 135/99, repeat 135/96.   Plan: Increase amlodipine to 5mg  daily Continue HCTZ 25mg  daily F/u in 4 weeks for BP check

## 2020-11-06 NOTE — Assessment & Plan Note (Signed)
[name] endorses fatigue for several months.  He denies any associated dizziness or lightheadedness, chest pain, palpitations, dyspnea on exertion or focal weakness.  He does endorse chronic dark stools from taking his current supplements with intermittent episodes of hematochezia which he believes is related to eating more "green" foods. Last episode of hematochezia was 2-3 weeks ago and lasted for few days before spontaneously resolving. No further episodes since then. Patient symptom of fatigue could be secondary to multiple etiologies including his history of iron deficiency anemia, heart murmur, leukopenia in the setting of prostate cancer with metastasis to the bone.  In regards to his iron deficiency anemia, patient has been on iron supplementation and most recent iron studies are within normal limits.  However, patient does have a history of rectal mass with multiple hemorrhoids in the past.  On examination, has normal rectal tone, external hemorrhoid nonthrombosed.  However, no active bleeding noted at this time.  FOBT negative. Patient would encouraged to follow-up with colonoscopy. Furthermore, patient has a recently found heart murmur for which she has been encouraged to get an echo; however, he has not been able to do so.  She continues to have a systolic murmur on examination.  Heart rate regular.  Patient encouraged to get his echo.  Will consider referral to cardiology for cardiac monitoring for possible arrhythmia. Patient does have a history of prostate cancer metastasis to the bone for which he is on Xtandi 40 mg four times daily.  He has not followed up with oncology recently.  Patient encouraged to follow-up.  Plan Referral to GI for colonoscopy Echo Consider referral to cardiology for Holter monitor

## 2020-11-11 ENCOUNTER — Other Ambulatory Visit: Payer: Self-pay | Admitting: *Deleted

## 2020-11-11 MED ORDER — HYDROCHLOROTHIAZIDE 25 MG PO TABS
ORAL_TABLET | ORAL | 1 refills | Status: DC
Start: 2020-11-11 — End: 2021-05-07

## 2020-11-11 NOTE — Progress Notes (Signed)
Internal Medicine Clinic Attending ° °Case discussed with Dr. Aslam  At the time of the visit.  We reviewed the resident’s history and exam and pertinent patient test results.  I agree with the assessment, diagnosis, and plan of care documented in the resident’s note.  °

## 2020-11-12 ENCOUNTER — Ambulatory Visit: Payer: Medicare Other | Admitting: Podiatry

## 2020-11-14 ENCOUNTER — Ambulatory Visit: Payer: Medicare Other | Admitting: *Deleted

## 2020-11-14 ENCOUNTER — Telehealth: Payer: Self-pay

## 2020-11-14 DIAGNOSIS — C7951 Secondary malignant neoplasm of bone: Secondary | ICD-10-CM

## 2020-11-14 DIAGNOSIS — I10 Essential (primary) hypertension: Secondary | ICD-10-CM

## 2020-11-14 DIAGNOSIS — C61 Malignant neoplasm of prostate: Secondary | ICD-10-CM

## 2020-11-14 DIAGNOSIS — E119 Type 2 diabetes mellitus without complications: Secondary | ICD-10-CM

## 2020-11-14 NOTE — Chronic Care Management (AMB) (Signed)
Chronic Care Management   CCM RN Visit Note  11/14/2020 Name: William Jones MRN: [id number] DOB: [birth year]  Subjective: William Jones is a 70 y.o. year old male who is a primary care patient of Aslam, [name], MD. The care management team was consulted for assistance with disease management and care coordination needs.    Engaged with patient by telephone for follow up visit in response to provider referral for case management and/or care coordination services.   Consent to Services:  The patient was given information about Chronic Care Management services, agreed to services, and gave verbal consent prior to initiation of services.  Please see initial visit note for detailed documentation.   Patient agreed to services and verbal consent obtained.   Assessment: Review of patient past medical history, allergies, medications, health status, including review of consultants reports, laboratory and other test data, was performed as part of comprehensive evaluation and provision of chronic care management services.   SDOH (Social Determinants of Health) assessments and interventions performed:    CCM Care Plan  No Known Allergies  Outpatient Encounter Medications as of 11/14/2020  Medication Sig Note  . amLODipine (NORVASC) 5 MG tablet Take 1 tablet (5 mg total) by mouth daily.   [name] atorvastatin (LIPITOR) 40 MG tablet Take 1 tablet (40 mg total) by mouth daily. 09/11/2020: Patient advised he can get refill of Atorvastatin today 09/11/20  . BETIMOL 0.5 % ophthalmic solution INT 1 GTT IN OD BID   . brimonidine (ALPHAGAN) 0.2 % ophthalmic solution INT 1 GTT INTO THE RIGHT EYE BID   . cholecalciferol (VITAMIN D3) 10 MCG (400 UNIT) TABS tablet Take 10,000 Units by mouth daily.   . dorzolamide (TRUSOPT) 2 % ophthalmic solution INT 1 GTT IN OD BID   . ferrous sulfate 325 (65 FE) MG tablet Take 1 tablet (325 mg total) by mouth daily with breakfast.   . hydrochlorothiazide (HYDRODIURIL)  25 MG tablet TAKE 1 TABLET(25 MG) BY MOUTH DAILY   . ofloxacin (OCUFLOX) 0.3 % ophthalmic solution    . prednisoLONE acetate (PRED FORTE) 1 % ophthalmic suspension    . sildenafil (VIAGRA) 100 MG tablet TAKE 1 TABLET BY MOUTH 1 HOURS BEFORE INTERCOURSE   . timolol (TIMOPTIC) 0.5 % ophthalmic solution PLACE ONE DROP 2 TIMES INTO THE RIGHT EYE UTD   . triamcinolone (KENALOG) 0.025 % ointment APPLY EXTERNALLY TO THE AFFECTED AREA TWICE DAILY   . XTANDI 40 MG capsule Take 160 mg by mouth daily.    No facility-administered encounter medications on file as of 11/14/2020.    Patient Active Problem List   Diagnosis Date Noted  . Fatigue 11/06/2020  . First degree hemorrhoids 10/14/2020  . Thrombosed hemorrhoids 10/14/2020  . Type 2 diabetes mellitus ([place]) 12/18/2019  . Heart murmur 12/18/2019  . Hyperlipidemia 11/20/2019  . Prostate cancer metastatic to bone ([place]) 12/29/2018  . Glaucoma 08/21/2018  . Leukopenia 04/20/2017  . Rectal mass 09/15/2016  . Healthcare maintenance 06/23/2016  . Essential hypertension 07/16/2015  . Erectile dysfunction 01/23/2014  . Iron deficiency anemia 01/08/2014    Conditions to be addressed/monitored:HTN, HLD, NIDDM- no meds, Prostate Ca with mets to bone, glaucoma  Care Plan : CCM RN Hypertension (Adult)  Updates made by Barrington Ellison, RN since 11/14/2020 12:00 AM    Problem: Hypertension (Hypertension)     Long-Range Goal: Hypertension Monitored and Managed to meet treatment targets of <140/<90   Start Date: 06/25/2020  Expected End Date: 07/03/2021  This Visit's Progress:  On track  Recent Progress: On track  Priority: High  Note:   see longitudinal plan of care for additional care plan information)  Objective:  . Last practice recorded BP readings:  . BP Readings from Last 3 Encounters: .  10/28/20 . (!) 135/96 .  06/19/20 . (!) 124/91 .  06/03/20 . (!) 129/98 .    [name] Most recent eGFR/CrCl: No results found for: EGFR  No components found for:  CRCL  Current Barriers:  [name] Knowledge Deficits related to basic understanding of hypertension pathophysiology and self care management- successful outreach to patient to complete follow up assessment, patient reports self monitored blood pressure meeting target for systolic but not diastolic as all diastolic numbers are  between 90-100, he reports good medication taking behavior in regards to his blood pressure medicines including taking 5 mg of amlodipine instead of 2.5 mg and reports no adverse side effects  Case Manager Clinical Goal(s):  [name] Over the next 30-60 days, patient will verbalize understanding of plan for hypertension management . Over the next 30-60 days, patient will attend all scheduled medical appointments.  Interventions:  . Evaluation of current treatment plan related to hypertension self management and patient's adherence to plan as established by provider. . Reviewed lifestyle strategies to lower blood pressure . Reviewed medications with patient and assessed medication taking behavior . Positive reinforcement given to patient for implementing strategies to improve his blood pressure and for checking his BP twice daily . Reviewed self monitored blood pressure readings and BP targets . Discussed plans with patient for ongoing care management follow up and provided patient with direct contact information for care management team . Reviewed scheduled/upcoming provider appointments including: with clinic on 11/25/20 . Advised patient this CCM RN will meed with him during clinic visit on 11/25/20 to further discuss lifestyle strategies to improve blood sugar control without medications  Patient Self Care Activities:  . UNABLE to independently manage HTN to meet treatment targets . Self administers medications as prescribed . Will make follow up clinic appointment ASAP since DBP not meeting treatment target . Attends all scheduled provider appointments . Follows a low sodium  diet/DASH diet . - check blood pressure at least daily . - choose a place to take my blood pressure (home, clinic or office, retail store) . - write blood pressure results in a log or diary   Please see past updates related to this goal by clicking on the "Past Updates" button in the selected goal     Care Plan : CCM RN [place] of Care (Adult)  Updates made by Barrington Ellison, RN since 11/14/2020 12:00 AM    Problem: Health Promotion or Disease Self-Management (General Plan of Care) for HLD     Long-Range Goal: Manage lipids to meet treatment targets   Start Date: 06/25/2020  Expected End Date: 07/03/2021  This Visit's Progress: On track  Recent Progress: On track  Priority: Medium  Note:   CARE PLAN ENTRY (see longitudinal plan of care for additional care plan information)  Current Barriers:  . Chronic Disease Management support, education, and care coordination needs related to HLD- Patient says he is following the nutritional information that was mailed to him by this CCM RN and continues to drink more water and less sweetened and has reduced his consumption of packaged sweets,  reports no further issues with getting his statin refills and he reports  good medication taking behavior in relation to statin and reports no adverse side  effects,   Clinical Goal(s) related to  HLD:  Over the next 30-60 days, patient will:  . Work with the care management team to address educational, disease management, and care coordination needs  . Continue self health monitoring activities as directed today reduce the amount of sweetened soda consumption from 1/2 liter per day  to none  . Call provider office for new problems and maintain schedule of regular follow up appointments  . Call care management team with questions or concerns . Verbalize basic understanding of patient centered plan of care established today  Interventions related to HLD:  . Evaluation of current treatment plans and  patient's adherence to plan as established by provider . Assessed patient understanding of disease states and strategies he has implemented to improve his cholesterol panel . Assessed statin medication taking behavior . Assessed statin tolerance . Will message provider to ask for order for fasting lipid recheck and/or LFT's during 11/25/20 clinic appointment  . Advised patient this CCM RN will plan to meet wtth him during his clinic visit on 11/25/20 . Assessed patient's education and care coordination needs . Discussed plans with patient for ongoing care management follow up and ensured patient has direct contact information for care management team  Patient Self Care Activities related to HLD:  . Patient is unable to independently self-manage chronic health conditions . - change to whole grain breads, cereal, pasta . - eat smaller or less servings of red meat . - fill half the plate with nonstarchy vegetables . - get blood test (fasting) done 1 week before next visit . - increase the amount of fiber in food . - read food labels for fat and fiber  Please see past updates related to this goal by clicking on the "Past Updates" button in the selected goal      Plan:Face to Face appointment with care management team member scheduled for: 11/25/20   Kelli Churn RN, CCM, [facility] RN Care Manager [phone]

## 2020-11-14 NOTE — Telephone Encounter (Signed)
Thank you Stacee 

## 2020-11-14 NOTE — Telephone Encounter (Signed)
Received TC from patient who states he is confused on which b/p meds to take.  Per LOV note, pt instructed to continue HCTZ 25mg  daily, to take amlodipine 5mg  daily.  Patient asking if he needs to take a 3rd pill, amlodipine 2.5mg  daily, RN instructed patient not to take anymore of the amlodipine 2.5mg  pills, only the 5mg  dose daily with his HCTZ.  He verbalized understanding.  He was reminded he has a telephone appt with CMN today and a f/u visit with MD on 11/25/20. SChaplin, RN,BSN

## 2020-11-14 NOTE — Patient Instructions (Signed)
Visit Information It was nice speaking with you today. PATIENT GOALS: Patient Care Plan: CCM RN Hypertension (Adult)    Problem Identified: Hypertension (Hypertension)     Long-Range Goal: Hypertension Monitored and Managed to meet treatment targets of <140/<90   Start Date: 06/25/2020  Expected End Date: 07/03/2021  This Visit's Progress: On track  Recent Progress: On track  Priority: High  Note:   see longitudinal plan of care for additional care plan information)  Objective:  . Last practice recorded BP readings:  . BP Readings from Last 3 Encounters: .  10/28/20 . (!) 135/96 .  06/19/20 . (!) 124/91 .  06/03/20 . (!) 129/98 .    [name] Most recent eGFR/CrCl: No results found for: EGFR  No components found for: CRCL  Current Barriers:  [name] Knowledge Deficits related to basic understanding of hypertension pathophysiology and self care management- successful outreach to patient to complete follow up assessment, patient reports self monitored blood pressure meeting target for systolic but not diastolic as all diastolic numbers are  between 90-100, he reports good medication taking behavior in regards to his blood pressure medicines including taking 5 mg of amlodipine instead of 2.5 mg and reports no adverse side effects  Case Manager Clinical Goal(s):  [name] Over the next 30-60 days, patient will verbalize understanding of plan for hypertension management . Over the next 30-60 days, patient will attend all scheduled medical appointments.  Interventions:  . Evaluation of current treatment plan related to hypertension self management and patient's adherence to plan as established by provider. . Reviewed lifestyle strategies to lower blood pressure . Reviewed medications with patient and assessed medication taking behavior . Positive reinforcement given to patient for implementing strategies to improve his blood pressure and for checking his BP twice daily . Reviewed self monitored blood  pressure readings and BP targets . Discussed plans with patient for ongoing care management follow up and provided patient with direct contact information for care management team . Reviewed scheduled/upcoming provider appointments including: with clinic on 11/25/20 . Advised patient this CCM RN will meed with him during clinic visit on 11/25/20 to further discuss lifestyle strategies to improve blood sugar control without medications  Patient Self Care Activities:  . UNABLE to independently manage HTN to meet treatment targets . Self administers medications as prescribed . Will make follow up clinic appointment ASAP since DBP not meeting treatment target . Attends all scheduled provider appointments . Follows a low sodium diet/DASH diet . - check blood pressure at least daily . - choose a place to take my blood pressure (home, clinic or office, retail store) . - write blood pressure results in a log or diary   Please see past updates related to this goal by clicking on the "Past Updates" button in the selected goal     Patient Care Plan: CCM RN [place] of Care (Adult)    Problem Identified: Health Promotion or Disease Self-Management (General Plan of Care) for HLD     Long-Range Goal: Manage lipids to meet treatment targets   Start Date: 06/25/2020  Expected End Date: 07/03/2021  This Visit's Progress: On track  Recent Progress: On track  Priority: Medium  Note:   CARE PLAN ENTRY (see longitudinal plan of care for additional care plan information)  Current Barriers:  . Chronic Disease Management support, education, and care coordination needs related to HLD- Patient says he is following the nutritional information that was mailed to him by this CCM RN and  continues to drink more water and less sweetened and has reduced his consumption of packaged sweets,  reports no further issues with getting his statin refills and he reports  good medication taking behavior in relation to  statin and reports no adverse side effects,   Clinical Goal(s) related to  HLD:  Over the next 30-60 days, patient will:  . Work with the care management team to address educational, disease management, and care coordination needs  . Continue self health monitoring activities as directed today reduce the amount of sweetened soda consumption from 1/2 liter per day  to none  . Call provider office for new problems and maintain schedule of regular follow up appointments  . Call care management team with questions or concerns . Verbalize basic understanding of patient centered plan of care established today  Interventions related to HLD:  . Evaluation of current treatment plans and patient's adherence to plan as established by provider . Assessed patient understanding of disease states and strategies he has implemented to improve his cholesterol panel . Assessed statin medication taking behavior . Assessed statin tolerance . Will message provider to ask for order for fasting lipid recheck and/or LFT's during 11/25/20 clinic appointment  . Advised patient this CCM RN will plan to meet wtth him during his clinic visit on 11/25/20 . Assessed patient's education and care coordination needs . Discussed plans with patient for ongoing care management follow up and ensured patient has direct contact information for care management team  Patient Self Care Activities related to HLD:  . Patient is unable to independently self-manage chronic health conditions . - change to whole grain breads, cereal, pasta . - eat smaller or less servings of red meat . - fill half the plate with nonstarchy vegetables . - get blood test (fasting) done 1 week before next visit . - increase the amount of fiber in food . - read food labels for fat and fiber  Please see past updates related to this goal by clicking on the "Past Updates" button in the selected goal      The patient verbalized understanding of instructions,  educational materials, and care plan provided today and declined offer to receive copy of patient instructions, educational materials, and care plan.   Face to Face appointment with care management team member scheduled for: 11/25/20  Kelli Churn RN, CCM, [facility] RN Care Manager [phone]

## 2020-11-14 NOTE — Progress Notes (Signed)
Internal Medicine Clinic Resident  I have personally reviewed this encounter including the documentation in this note and/or discussed this patient with the care management provider. I will address any urgent items identified by the care management provider and will communicate my actions to the patient's PCP. I have reviewed the patient's CCM visit with my supervising attending, Dr  Jimmye Norman .  Foy [place], MD 11/14/2020

## 2020-11-14 NOTE — Progress Notes (Signed)
THis CCM encounter is reviewed and approved and plan noted for upcoming face to face visit.

## 2020-11-25 ENCOUNTER — Ambulatory Visit: Payer: Medicare Other | Admitting: *Deleted

## 2020-11-25 ENCOUNTER — Encounter: Payer: Self-pay | Admitting: Student

## 2020-11-25 ENCOUNTER — Ambulatory Visit (INDEPENDENT_AMBULATORY_CARE_PROVIDER_SITE_OTHER): Payer: Medicare Other | Admitting: Student

## 2020-11-25 VITALS — BP 119/83 | HR 71 | Temp 98.1°F | Ht 69.0 in | Wt 189.0 lb

## 2020-11-25 DIAGNOSIS — C61 Malignant neoplasm of prostate: Secondary | ICD-10-CM

## 2020-11-25 DIAGNOSIS — D509 Iron deficiency anemia, unspecified: Secondary | ICD-10-CM

## 2020-11-25 DIAGNOSIS — E782 Mixed hyperlipidemia: Secondary | ICD-10-CM

## 2020-11-25 DIAGNOSIS — E119 Type 2 diabetes mellitus without complications: Secondary | ICD-10-CM

## 2020-11-25 DIAGNOSIS — I10 Essential (primary) hypertension: Secondary | ICD-10-CM

## 2020-11-25 DIAGNOSIS — C7951 Secondary malignant neoplasm of bone: Secondary | ICD-10-CM

## 2020-11-25 LAB — POCT GLYCOSYLATED HEMOGLOBIN (HGB A1C): Hemoglobin A1C: 6.3 % — AB (ref 4.0–5.6)

## 2020-11-25 LAB — GLUCOSE, CAPILLARY: Glucose-Capillary: 99 mg/dL (ref 70–99)

## 2020-11-25 NOTE — Patient Instructions (Signed)
Thank you, [facility] for allowing [place] to provide your care today. Today we discussed   Blood Pressure - Your blood pressure looks great! Continue to take your medicines and checking it at home 1-3 times a month. If you notice your blood pressure is dropping too low and you feel lightheaded or dizzy, please give our office a call.   High Cholesterol - We repeated your cholesterol levels today. I will update you with the lab results once they come back. Please continue to take your atorvastatin.   I will check all of the referrals that were in place.    I have ordered the following labs for you:   Lab Orders     Lipid Profile     Glucose, capillary     POC Hbg A1C    Referrals ordered today:   Referral Orders  No referral(s) requested today     I have ordered the following medication/changed the following medications:   Stop the following medications: There are no discontinued medications.   Start the following medications: No orders of the defined types were placed in this encounter.    Follow up: 4-6 months    Should you have any questions or concerns please call the internal medicine clinic at [phone].     Sanjuana Letters, D.O. [place]

## 2020-11-25 NOTE — Assessment & Plan Note (Signed)
BP Readings from Last 3 Encounters:  11/25/20 119/83  10/28/20 (!) 135/96  06/19/20 (!) 124/91   Assessment: William Jones has been adherent to medication regimen of amlodipine 5 mg daily, HCTZ 25 mg. He endorses checking his blood pressures at home and they range from 358-251'G systolically. Denies any lightheadedness or dizziness.   Plan: -ct current regiment of amlodipine 5 mg and HCTZ 25 mg daily

## 2020-11-25 NOTE — Assessment & Plan Note (Signed)
Lab Results  Component Value Date   HGBA1C 6.3 (A) 11/25/2020   Assessment: A1c improved from 6.6. to 6.3. Patient has been continuing with a healthy diet. Recommend Repeat in 3-6 months.  Plan: -continue to encourage patient to follow healthy diet and exercise regularly -repeat A1c in 3-6 months

## 2020-11-25 NOTE — Patient Instructions (Signed)
Visit Information It was nice meeting with you today. PATIENT GOALS: Patient Care Plan: CCM RN Hypertension (Adult)    Problem Identified: Hypertension (Hypertension)     Long-Range Goal: Hypertension Monitored and Managed to meet treatment targets of <140/<90   Start Date: 06/25/2020  Expected End Date: 07/03/2021  This Visit's Progress: On track  Recent Progress: On track  Priority: High  Note:   see longitudinal plan of care for additional care plan information)  Objective:  . Last practice recorded BP readings:  . BP Readings from Last 3 Encounters: .  11/25/20 . 119/83 .  10/28/20 . (!) 135/96 .  06/19/20 . (!) 124/91 .     William Jones Most recent eGFR/CrCl: No results found for: EGFR  No components found for: CRCL  Current Barriers:  William Jones Knowledge Deficits related to basic understanding of hypertension pathophysiology and self care management- met with patient for approximately one hour after his clinic appointment, patient very pleased that his blood pressure readings are meeting target, states he continue to limit sodium in his diet and drinks primarily water or flavored water, he reports good medication taking behavior in regards to his blood pressure medicines including taking 5 mg of amlodipine instead of 2.5 mg and reports no adverse side effects  Case Manager Clinical Goal(s):  William Jones Over the next 30-60 days, patient will verbalize understanding of plan for hypertension management . Over the next 30-60 days, patient will attend all scheduled medical appointments.  Interventions:  . Evaluation of current treatment plan related to hypertension self management and patient's adherence to plan as established by provider. . Reviewed lifestyle strategies to lower blood pressure . Reviewed medications with patient and assessed medication taking behavior . Positive reinforcement given to patient for implementing strategies to improve his blood pressure and for checking his BP twice  daily . Reviewed self monitored blood pressure readings and BP targets . Provided patient with "Managing Blood Pressure"  booklet and encouraged him to read and call this CCM Rn for questions . Provided him with am/pm 7 day medication box . Gave him [facility] spiral bound calendar with HTN self management education . Discussed plans with patient for ongoing care management follow up and provided patient with direct contact information for care management team . Reviewed scheduled/upcoming provider appointments including: with clinic on 11/25/20 . Advised patient this CCM RN will meed with him during clinic visit on 11/25/20 to further discuss lifestyle strategies to improve blood sugar control without medications  Patient Self Care Activities:  . UNABLE to independently manage HTN to meet treatment targets . Self administers medications as prescribed . Will make follow up clinic appointment ASAP since DBP not meeting treatment target . Attends all scheduled provider appointments . Follows a low sodium diet/DASH diet . - check blood pressure at least daily . - choose a place to take my blood pressure (home, clinic or office, retail store) . - write blood pressure results in a log or diary   Please see past updates related to this goal by clicking on the "Past Updates" button in the selected goal     Patient Care Plan: CCM RN [place] of Care (Adult)    Problem Identified: Health Promotion or Disease Self-Management (General Plan of Care) for HLD     Long-Range Goal: Manage lipids to meet treatment targets   Start Date: 06/25/2020  Expected End Date: 07/03/2021  Recent Progress: On track  Priority: Medium  Note:  CARE PLAN ENTRY (see longitudinal plan of care for additional care plan information)  Current Barriers:  . Chronic Disease Management support, education, and care coordination needs related to HLD- met with patient after his clinic visit,  states he had lipid panel drawn today and results are pending, he says he is following the nutritional information that was mailed to him by this CCM RN and continues to drink more water or flavored water and has reduced his consumption of packaged sweets,  reports no further issues with getting his statin refills and he reports  good medication taking behavior in relation to statin and reports no adverse side effects,   Clinical Goal(s) related to  HLD:  Over the next 30-60 days, patient will:  . Work with the care management team to address educational, disease management, and care coordination needs  . Continue self health monitoring activities as directed today reduce the amount of sweetened soda consumption from 1/2 liter per day  to none  . Call provider office for new problems and maintain schedule of regular follow up appointments  . Call care management team with questions or concerns . Verbalize basic understanding of patient centered plan of care established today  Interventions related to HLD:  . Evaluation of current treatment plans and patient's adherence to plan as established by provider . Assessed patient understanding of disease states and strategies he has implemented to improve his cholesterol panel . Assessed statin medication taking behavior and suggested he take statin in the evening to improve efficacy . Assessed statin tolerance . Assessed patient's education and care coordination needs . Discussed plans with patient for ongoing care management follow up and ensured patient has direct contact information for care management team  Patient Self Care Activities related to HLD:  . Patient is unable to independently self-manage chronic health conditions . - change to whole grain breads, cereal, pasta . - eat smaller or less servings of red meat . - fill half the plate with nonstarchy vegetables . - increase the amount of fiber in food . - read food labels for fat and  fiber  Please see past updates related to this goal by clicking on the "Past Updates" button in the selected goal    Patient Care Plan: CCM RN- Diabetes Type 2 (Adult)    Problem Identified: Glycemic Management (Diabetes, Type 2)     Long-Range Goal: Glycemic Management Optimized wihtou requiring DM medication per patient goal   Start Date: 11/25/2020  This Visit's Progress: On track  Priority: High  Note:   Objective:  Lab Results  Component Value Date   HGBA1C 6.3 (A) 11/25/2020 .   Lab Results  Component Value Date   CREATININE 0.90 12/18/2019   CREATININE 0.70 (L) 11/06/2019   CREATININE 0.78 04/10/2019 .   William Jones No results found for: EGFR Current Barriers:  William Jones Knowledge Deficits related to basic Diabetes pathophysiology and self care/management . Unable to independently self educate regarding diabetes self management  Case Manager Clinical Goal(s):  . patient will demonstrate improved adherence to prescribed treatment plan for diabetes self care/management as evidenced by: adherence to ADA/ carb modified diet contacting provider for new or worsened symptoms or questions, achieve good glycemic control without need for DM medication Interventions:  . Collaboration with Eliezer Bottom, MD regarding development and update of comprehensive plan of care as evidenced by provider attestation and co-signature . Inter-disciplinary care team collaboration (see longitudinal plan of care) . Provided education to patient about basic DM disease  process . Provided patient with DM information booklet . Discussed definition of glycemic index and provided patient with glycemic index food guide . Discussed results of POC Hgb A1C today, target and correlation to blood sugar . Positive feedback provided to patient for improved Hgb A1C . Provided patient with written educational materials related to hypo and hyperglycemia and importance of correct treatment . Review of patient status, including review  of consultants reports, relevant laboratory and other test results, and medications completed. Patient Goals: . Set My Target A1C and manage blood sugar to meet target of <7.0% without requiring DM medications  Self-Care Activities - Attends all scheduled provider appointments Adheres to prescribed ADA/carb modified Follow Up Plan: The care management team will reach out to the patient again over the next 30-60 days.       The patient verbalized understanding of instructions, educational materials, and care plan provided today and declined offer to receive copy of patient instructions, educational materials, and care plan.   The care management team will reach out to the patient again over the next 30-60 days.   Kelli Churn RN, CCM, [facility] RN Care Manager [phone]

## 2020-11-25 NOTE — Progress Notes (Signed)
CC: High BP/Choelsterol  HPI:  William Jones is a 71 y.o. male with a past medical history stated below and presents today for high BP/choelsterol. Please see problem based assessment and plan for additional details.  Past Medical History:  Diagnosis Date  . Angiodysplasia of colon with hemorrhage 10/14/2020  . Duodenitis   . Essential hypertension 07/16/2015  . Glaucoma   . H/O: substance abuse ([place])   . Hemorrhage of rectum and anus 10/14/2020  . History of frostbite   . History of prostate cancer 03/20/2014   Lost to f/u with [facility] in the past 2011 with elevated PSA >30 at that time.  Saw [facility] (Dr. Risa Grill) on 03/13/14. Cancer Noted 03/13/14 office visit. Gleason score 7. Plan CT Ab/pelvis with contrast, bone scan   . History of radiation therapy nov 2015 to feb 2016  . Hyperlipidemia 11/20/2019  . Iron deficiency anemia   . Malignant tumor of prostate ([place]) 10/14/2020  . Peripheral neuropathy   . Prostate cancer ([place]) 03/05/14   Gleason 7, volume 45 gm  . Rash and nonspecific skin eruption 08/04/2017  . S/P radiation therapy 09/18/14-11/15/14   prostate/ 7800Gy/40sessions  . Thrombocytopenia ([place])     Current Outpatient Medications on File Prior to Visit  Medication Sig Dispense Refill  . amLODipine (NORVASC) 5 MG tablet Take 1 tablet (5 mg total) by mouth daily. 90 tablet 3  . atorvastatin (LIPITOR) 40 MG tablet Take 1 tablet (40 mg total) by mouth daily. 30 tablet 11  . BETIMOL 0.5 % ophthalmic solution INT 1 GTT IN OD BID  5  . brimonidine (ALPHAGAN) 0.2 % ophthalmic solution INT 1 GTT INTO THE RIGHT EYE BID  0  . cholecalciferol (VITAMIN D3) 10 MCG (400 UNIT) TABS tablet Take 1,000 Units by mouth daily.    . dorzolamide (TRUSOPT) 2 % ophthalmic solution INT 1 GTT IN OD BID    . ferrous sulfate 325 (65 FE) MG tablet Take 1 tablet (325 mg total) by mouth daily with breakfast. 90 tablet 3  . hydrochlorothiazide (HYDRODIURIL) 25 MG tablet TAKE 1  TABLET(25 MG) BY MOUTH DAILY 90 tablet 1  . ofloxacin (OCUFLOX) 0.3 % ophthalmic solution     . prednisoLONE acetate (PRED FORTE) 1 % ophthalmic suspension     . sildenafil (VIAGRA) 100 MG tablet TAKE 1 TABLET BY MOUTH 1 HOURS BEFORE INTERCOURSE (Patient not taking: Reported on 11/25/2020)    . timolol (TIMOPTIC) 0.5 % ophthalmic solution PLACE ONE DROP 2 TIMES INTO THE RIGHT EYE UTD  7  . triamcinolone (KENALOG) 0.025 % ointment APPLY EXTERNALLY TO THE AFFECTED AREA TWICE DAILY (Patient not taking: Reported on 11/25/2020) 80 g 2  . XTANDI 40 MG capsule Take 160 mg by mouth daily.     No current facility-administered medications on file prior to visit.    Family History  Problem Relation Age of Onset  . Kidney failure Brother 45       has been on HD since age 54   . Edema Mother        Legs  . Arthritis Sister        knees  . Stroke Maternal Uncle        70s-80s  . Cancer Neg Hx     Social History   Socioeconomic History  . Marital status: Widowed    Spouse name: Joyce Copa  . Number of children: 2  . Years of education: Not on file  . Highest education  level: Not on file  Occupational History  . Occupation: Disabled  Tobacco Use  . Smoking status: Former Smoker    Packs/day: 0.50    Years: 30.00    Pack years: 15.00    Types: Cigarettes    Quit date: 10/05/2007    Years since quitting: 13.1  . Smokeless tobacco: Never Used  Vaping Use  . Vaping Use: Never used  Substance and Sexual Activity  . Alcohol use: Not Currently    Alcohol/week: 0.0 standard drinks    Comment: no alcohol for five years (per conversation 2020)  . Drug use: No    Types: "Crack" cocaine, Marijuana    Comment: per Epic note 2009hx of crack cocaine use 12 years ago per pt, no current marijuana use per pt  . Sexual activity: Yes  Other Topics Concern  . Not on file  Social History Narrative   On disability - frostbite. Used to work for [employer]. Retired in 1990. Has 2 kids. 6th grade.  Resides with significant other.      Current Social History 06/08/2019        Patient lives with a significant other in a/an apartment which is 1 story/stories. There are steps up to the entrance the patient uses.       Patient's method of transportation is personal car.      The highest level of education was some high school.      The patient currently disabled.      Identified important Relationships are Scientist, water quality (Fiance) and Agnes Lawrence (Daughter)       Pets : None       Interests / Fun: "Going to the park and walking around. Go visit people that we know."       Current Stressors: "I don't have any"       Religious / Personal Beliefs: "Holiness"       L. [place], RN, BSN       Social Determinants of Health   Financial Resource Strain: Low Risk   . Difficulty of Paying Living Expenses: Not hard at all  Food Insecurity: No Food Insecurity  . Worried About Charity fundraiser in the Last Year: Never true  . Ran Out of Food in the Last Year: Never true  Transportation Needs: No Transportation Needs  . Lack of Transportation (Medical): No  . Lack of Transportation (Non-Medical): No  Physical Activity: Sufficiently Active  . Days of Exercise per Week: 5 days  . Minutes of Exercise per Session: 150+ min  Stress: No Stress Concern Present  . Feeling of Stress : Not at all  Social Connections: Moderately Integrated  . Frequency of Communication with Friends and Family: More than three times a week  . Frequency of Social Gatherings with Friends and Family: Twice a week  . Attends Religious Services: More than 4 times per year  . Active Member of Clubs or Organizations: No  . Attends Archivist Meetings: Never  . Marital Status: Living with partner  Intimate Partner Violence: Not At Risk  . Fear of Current or Ex-Partner: No  . Emotionally Abused: No  . Physically Abused: No  . Sexually Abused: No    Review of Systems: ROS negative except for what is  noted on the assessment and plan.  Vitals:   11/25/20 0857  BP: 119/83  Pulse: 71  Temp: 98.1 F (36.7 C)  TempSrc: Oral  SpO2: 100%  Weight: 189 lb (85.7 kg)  Height:  5\' 9"  (1.753 m)     Physical Exam: Constitutional: well-appearing, sitting comfortably, in no acute distress HENT: normocephalic atraumatic, mucous membranes moist Eyes: conjunctiva non-erythematous Neck: supple Cardiovascular: regular rate and rhythm, no m/r/g Pulmonary/Chest: normal work of breathing on room air MSK: normal bulk and tone Neurological: alert & oriented x 3 Skin: warm and dry Psych: Normal thought process    Assessment & Plan:   See Encounters Tab for problem based charting.  Patient discussed with Dr. Laurena Slimmer, D.O. [facility], PGY-1 Pager: [phone], Phone: [phone] Date 11/25/2020 Time 4:44 PM

## 2020-11-25 NOTE — Chronic Care Management (AMB) (Signed)
Chronic Care Management   CCM RN Visit Note  11/25/2020 Name: William Jones MRN: [id number] DOB: June 02, 1951  Subjective: William Jones is a 70 y.o. year old male who is a primary care patient of Aslam, [name], MD. The care management team was consulted for assistance with disease management and care coordination needs.    Engaged with patient face to face for follow up visit in response to provider referral for case management and/or care coordination services.   Consent to Services:  The patient was given information about Chronic Care Management services, agreed to services, and gave verbal consent prior to initiation of services.  Please see initial visit note for detailed documentation.   Patient agreed to services and verbal consent obtained.   Assessment: Review of patient past medical history, allergies, medications, health status, including review of consultants reports, laboratory and other test data, was performed as part of comprehensive evaluation and provision of chronic care management services.   SDOH (Social Determinants of Health) assessments and interventions performed:    CCM Care Plan  No Known Allergies  Outpatient Encounter Medications as of 11/25/2020  Medication Sig Note  . amLODipine (NORVASC) 5 MG tablet Take 1 tablet (5 mg total) by mouth daily.   [name] atorvastatin (LIPITOR) 40 MG tablet Take 1 tablet (40 mg total) by mouth daily. 09/11/2020: Patient advised he can get refill of Atorvastatin today 09/11/20  . BETIMOL 0.5 % ophthalmic solution INT 1 GTT IN OD BID   . brimonidine (ALPHAGAN) 0.2 % ophthalmic solution INT 1 GTT INTO THE RIGHT EYE BID   . CALCIUM CITRATE PO Take 600 mg by mouth in the morning and at bedtime.   . cholecalciferol (VITAMIN D3) 10 MCG (400 UNIT) TABS tablet Take 1,000 Units by mouth daily.   . dorzolamide (TRUSOPT) 2 % ophthalmic solution INT 1 GTT IN OD BID   . ferrous sulfate 325 (65 FE) MG tablet Take 1 tablet (325 mg  total) by mouth daily with breakfast.   . hydrochlorothiazide (HYDRODIURIL) 25 MG tablet TAKE 1 TABLET(25 MG) BY MOUTH DAILY   . Multiple Vitamins-Minerals (ONE-A-DAY PROACTIVE 65+) TABS Take 1 tablet by mouth daily.   [name] ofloxacin (OCUFLOX) 0.3 % ophthalmic solution    . prednisoLONE acetate (PRED FORTE) 1 % ophthalmic suspension    . timolol (TIMOPTIC) 0.5 % ophthalmic solution PLACE ONE DROP 2 TIMES INTO THE RIGHT EYE UTD   . vitamin E (VITAMIN E) 180 MG (400 UNITS) capsule Take 400 Units by mouth daily.   Gillermina Phy 40 MG capsule Take 160 mg by mouth daily.   . sildenafil (VIAGRA) 100 MG tablet TAKE 1 TABLET BY MOUTH 1 HOURS BEFORE INTERCOURSE (Patient not taking: Reported on 11/25/2020)   . triamcinolone (KENALOG) 0.025 % ointment APPLY EXTERNALLY TO THE AFFECTED AREA TWICE DAILY (Patient not taking: Reported on 11/25/2020)    No facility-administered encounter medications on file as of 11/25/2020.    Patient Active Problem List   Diagnosis Date Noted  . Fatigue 11/06/2020  . First degree hemorrhoids 10/14/2020  . Thrombosed hemorrhoids 10/14/2020  . Type 2 diabetes mellitus ([place]) 12/18/2019  . Heart murmur 12/18/2019  . Hyperlipidemia 11/20/2019  . Prostate cancer metastatic to bone ([place]) 12/29/2018  . Glaucoma 08/21/2018  . Leukopenia 04/20/2017  . Rectal mass 09/15/2016  . Healthcare maintenance 06/23/2016  . Essential hypertension 07/16/2015  . Erectile dysfunction 01/23/2014  . Iron deficiency anemia 01/08/2014    Conditions to be addressed/monitored: HTN, HLD, NIDDM- no  meds, Prostate Ca with mets to bone, glaucoma  Care Plan : CCM RN Hypertension (Adult)  Updates made by Barrington Ellison, RN since 11/25/2020 12:00 AM    Problem: Hypertension (Hypertension)     Long-Range Goal: Hypertension Monitored and Managed to meet treatment targets of <140/<90   Start Date: 06/25/2020  Expected End Date: 07/03/2021  This Visit's Progress: On track  Recent Progress: On track   Priority: High  Note:   see longitudinal plan of care for additional care plan information)  Objective:  . Last practice recorded BP readings:  . BP Readings from Last 3 Encounters: .  11/25/20 . 119/83 .  10/28/20 . (!) 135/96 .  06/19/20 . (!) 124/91 .     [name] Most recent eGFR/CrCl: No results found for: EGFR  No components found for: CRCL  Current Barriers:  [name] Knowledge Deficits related to basic understanding of hypertension pathophysiology and self care management- met with patient for approximately one hour after his clinic appointment, patient very pleased that his blood pressure readings are meeting target, states he continue to limit sodium in his diet and drinks primarily water or flavored water, he reports good medication taking behavior in regards to his blood pressure medicines including taking 5 mg of amlodipine instead of 2.5 mg and reports no adverse side effects  Case Manager Clinical Goal(s):  [name] Over the next 30-60 days, patient will verbalize understanding of plan for hypertension management . Over the next 30-60 days, patient will attend all scheduled medical appointments.  Interventions:  . Evaluation of current treatment plan related to hypertension self management and patient's adherence to plan as established by provider. . Reviewed lifestyle strategies to lower blood pressure . Reviewed medications with patient and assessed medication taking behavior . Positive reinforcement given to patient for implementing strategies to improve his blood pressure and for checking his BP twice daily . Reviewed self monitored blood pressure readings and BP targets . Provided patient with "Managing Blood Pressure"  booklet and encouraged him to read and call this CCM Rn for questions . Provided him with am/pm 7 day medication box . Gave him [facility] spiral bound calendar with HTN self management education . Discussed plans with patient for ongoing  care management follow up and provided patient with direct contact information for care management team . Reviewed scheduled/upcoming provider appointments including: with clinic on 11/25/20 . Advised patient this CCM RN will meed with him during clinic visit on 11/25/20 to further discuss lifestyle strategies to improve blood sugar control without medications  Patient Self Care Activities:  . UNABLE to independently manage HTN to meet treatment targets . Self administers medications as prescribed . Will make follow up clinic appointment ASAP since DBP not meeting treatment target . Attends all scheduled provider appointments . Follows a low sodium diet/DASH diet . - check blood pressure at least daily . - choose a place to take my blood pressure (home, clinic or office, retail store) . - write blood pressure results in a log or diary   Please see past updates related to this goal by clicking on the "Past Updates" button in the selected goal     Care Plan : CCM RN [place] of Care (Adult)  Updates made by Barrington Ellison, RN since 11/25/2020 12:00 AM    Problem: Health Promotion or Disease Self-Management (General Plan of Care) for HLD     Long-Range Goal: Manage lipids to meet treatment targets  Start Date: 06/25/2020  Expected End Date: 07/03/2021  Recent Progress: On track  Priority: Medium  Note:   CARE PLAN ENTRY (see longitudinal plan of care for additional care plan information)  Current Barriers:  . Chronic Disease Management support, education, and care coordination needs related to HLD- met with patient after his clinic visit, states he had lipid panel drawn today and results are pending, he says he is following the nutritional information that was mailed to him by this CCM RN and continues to drink more water or flavored water and has reduced his consumption of packaged sweets,  reports no further issues with getting his statin refills and he reports  good medication  taking behavior in relation to statin and reports no adverse side effects,   Clinical Goal(s) related to  HLD:  Over the next 30-60 days, patient will:  . Work with the care management team to address educational, disease management, and care coordination needs  . Continue self health monitoring activities as directed today reduce the amount of sweetened soda consumption from 1/2 liter per day  to none  . Call provider office for new problems and maintain schedule of regular follow up appointments  . Call care management team with questions or concerns . Verbalize basic understanding of patient centered plan of care established today  Interventions related to HLD:  . Evaluation of current treatment plans and patient's adherence to plan as established by provider . Assessed patient understanding of disease states and strategies he has implemented to improve his cholesterol panel . Assessed statin medication taking behavior and suggested he take statin in the evening to improve efficacy . Assessed statin tolerance . Assessed patient's education and care coordination needs . Discussed plans with patient for ongoing care management follow up and ensured patient has direct contact information for care management team  Patient Self Care Activities related to HLD:  . Patient is unable to independently self-manage chronic health conditions . - change to whole grain breads, cereal, pasta . - eat smaller or less servings of red meat . - fill half the plate with nonstarchy vegetables . - increase the amount of fiber in food . - read food labels for fat and fiber  Please see past updates related to this goal by clicking on the "Past Updates" button in the selected goal    Care Plan : CCM RN- Diabetes Type 2 (Adult)  Updates made by Barrington Ellison, RN since 11/25/2020 12:00 AM    Problem: Glycemic Management (Diabetes, Type 2)     Long-Range Goal: Glycemic Management Optimized wihtou requiring DM  medication per patient goal   Start Date: 11/25/2020  This Visit's Progress: On track  Priority: High  Note:   Objective:  Lab Results  Component Value Date   HGBA1C 6.3 (A) 11/25/2020 .   Lab Results  Component Value Date   CREATININE 0.90 12/18/2019   CREATININE 0.70 (L) 11/06/2019   CREATININE 0.78 04/10/2019 .   [name] No results found for: EGFR Current Barriers:  [name] Knowledge Deficits related to basic Diabetes pathophysiology and self care/management . Unable to independently self educate regarding diabetes self management  Case Manager Clinical Goal(s):  . patient will demonstrate improved adherence to prescribed treatment plan for diabetes self care/management as evidenced by: adherence to ADA/ carb modified diet contacting provider for new or worsened symptoms or questions, achieve good glycemic control without need for DM medication Interventions:  . Collaboration with Harvie Heck, MD regarding development and update  of comprehensive plan of care as evidenced by provider attestation and co-signature . Inter-disciplinary care team collaboration (see longitudinal plan of care) . Provided education to patient about basic DM disease process . Provided patient with DM information booklet . Discussed results of POC Hgb A1C today, target and correlation to blood sugar . Positive feedback provided to patient for improved Hgb A1C . Provided patient with written educational materials related to hypo and hyperglycemia and importance of correct treatment . Review of patient status, including review of consultants reports, relevant laboratory and other test results, and medications completed. Patient Goals: . Set My Target A1C and manage blood sugar to meet target of <7.0% without requiring DM medications  Self-Care Activities - Attends all scheduled provider appointments Adheres to prescribed ADA/carb modified Follow Up Plan: The care management team will reach out to the patient again  over the next 30-60 days.       Patient Care Plan: CCM RN Hypertension (Adult)    Problem Identified: Hypertension (Hypertension)     Long-Range Goal: Hypertension Monitored and Managed to meet treatment targets of <140/<90   Start Date: 06/25/2020  Expected End Date: 07/03/2021  This Visit's Progress: On track  Recent Progress: On track  Priority: High  Note:   see longitudinal plan of care for additional care plan information)  Objective:  . Last practice recorded BP readings:  . BP Readings from Last 3 Encounters: .  11/25/20 . 119/83 .  10/28/20 . (!) 135/96 .  06/19/20 . (!) 124/91 .     [name] Most recent eGFR/CrCl: No results found for: EGFR  No components found for: CRCL  Current Barriers:  [name] Knowledge Deficits related to basic understanding of hypertension pathophysiology and self care management- met with patient for approximately one hour after his clinic appointment, patient very pleased that his blood pressure readings are meeting target, states he continue to limit sodium in his diet and drinks primarily water or flavored water, he reports good medication taking behavior in regards to his blood pressure medicines including taking 5 mg of amlodipine instead of 2.5 mg and reports no adverse side effects  Case Manager Clinical Goal(s):  [name] Over the next 30-60 days, patient will verbalize understanding of plan for hypertension management . Over the next 30-60 days, patient will attend all scheduled medical appointments.  Interventions:  . Evaluation of current treatment plan related to hypertension self management and patient's adherence to plan as established by provider. . Reviewed lifestyle strategies to lower blood pressure . Reviewed medications with patient and assessed medication taking behavior . Positive reinforcement given to patient for implementing strategies to improve his blood pressure and for checking his BP twice daily . Reviewed self monitored blood pressure  readings and BP targets . Provided patient with "Managing Blood Pressure"  booklet and encouraged him to read and call this CCM Rn for questions . Provided him with am/pm 7 day medication box . Gave him [facility] spiral bound calendar with HTN self management education . Discussed plans with patient for ongoing care management follow up and provided patient with direct contact information for care management team . Reviewed scheduled/upcoming provider appointments including: with clinic on 11/25/20 . Advised patient this CCM RN will meed with him during clinic visit on 11/25/20 to further discuss lifestyle strategies to improve blood sugar control without medications  Patient Self Care Activities:  . UNABLE to independently manage HTN to meet treatment targets . Self administers medications as prescribed .  Will make follow up clinic appointment ASAP since DBP not meeting treatment target . Attends all scheduled provider appointments . Follows a low sodium diet/DASH diet . - check blood pressure at least daily . - choose a place to take my blood pressure (home, clinic or office, retail store) . - write blood pressure results in a log or diary   Please see past updates related to this goal by clicking on the "Past Updates" button in the selected goal     Patient Care Plan: CCM RN [place] of Care (Adult)    Problem Identified: Health Promotion or Disease Self-Management (General Plan of Care) for HLD     Long-Range Goal: Manage lipids to meet treatment targets   Start Date: 06/25/2020  Expected End Date: 07/03/2021  Recent Progress: On track  Priority: Medium  Note:   CARE PLAN ENTRY (see longitudinal plan of care for additional care plan information)  Current Barriers:  . Chronic Disease Management support, education, and care coordination needs related to HLD- met with patient after his clinic visit, states he had lipid panel drawn today and  results are pending, he says he is following the nutritional information that was mailed to him by this CCM RN and continues to drink more water or flavored water and has reduced his consumption of packaged sweets,  reports no further issues with getting his statin refills and he reports  good medication taking behavior in relation to statin and reports no adverse side effects,   Clinical Goal(s) related to  HLD:  Over the next 30-60 days, patient will:  . Work with the care management team to address educational, disease management, and care coordination needs  . Continue self health monitoring activities as directed today reduce the amount of sweetened soda consumption from 1/2 liter per day  to none  . Call provider office for new problems and maintain schedule of regular follow up appointments  . Call care management team with questions or concerns . Verbalize basic understanding of patient centered plan of care established today  Interventions related to HLD:  . Evaluation of current treatment plans and patient's adherence to plan as established by provider . Assessed patient understanding of disease states and strategies he has implemented to improve his cholesterol panel . Assessed statin medication taking behavior and suggested he take statin in the evening to improve efficacy . Assessed statin tolerance . Assessed patient's education and care coordination needs . Discussed plans with patient for ongoing care management follow up and ensured patient has direct contact information for care management team  Patient Self Care Activities related to HLD:  . Patient is unable to independently self-manage chronic health conditions . - change to whole grain breads, cereal, pasta . - eat smaller or less servings of red meat . - fill half the plate with nonstarchy vegetables . - increase the amount of fiber in food . - read food labels for fat and fiber  Please see past updates related to  this goal by clicking on the "Past Updates" button in the selected goal    Patient Care Plan: CCM RN- Diabetes Type 2 (Adult)    Problem Identified: Glycemic Management (Diabetes, Type 2)     Long-Range Goal: Glycemic Management Optimized wihtou requiring DM medication per patient goal   Start Date: 11/25/2020  This Visit's Progress: On track  Priority: High  Note:   Objective:  Lab Results  Component Value Date   HGBA1C 6.3 (A) 11/25/2020 .  Lab Results  Component Value Date   CREATININE 0.90 12/18/2019   CREATININE 0.70 (L) 11/06/2019   CREATININE 0.78 04/10/2019 .   [name] No results found for: EGFR Current Barriers:  [name] Knowledge Deficits related to basic Diabetes pathophysiology and self care/management . Unable to independently self educate regarding diabetes self management  Case Manager Clinical Goal(s):  . patient will demonstrate improved adherence to prescribed treatment plan for diabetes self care/management as evidenced by: adherence to ADA/ carb modified diet contacting provider for new or worsened symptoms or questions, achieve good glycemic control without need for DM medication Interventions:  . Collaboration with Harvie Heck, MD regarding development and update of comprehensive plan of care as evidenced by provider attestation and co-signature . Inter-disciplinary care team collaboration (see longitudinal plan of care) . Provided education to patient about basic DM disease process . Provided patient with DM information booklet . Discussed definition of glycemic index and provided patient with glycemic index food guide . Discussed results of POC Hgb A1C today, target and correlation to blood sugar . Positive feedback provided to patient for improved Hgb A1C . Provided patient with written educational materials related to hypo and hyperglycemia and importance of correct treatment . Review of patient status, including review of consultants reports, relevant laboratory  and other test results, and medications completed. Patient Goals: . Set My Target A1C and manage blood sugar to meet target of <7.0% without requiring DM medications  Self-Care Activities - Attends all scheduled provider appointments Adheres to prescribed ADA/carb modified Follow Up Plan: The care management team will reach out to the patient again over the next 30-60 days.        Kelli Churn RN, CCM, [facility] RN Care Manager [phone]

## 2020-11-25 NOTE — Assessment & Plan Note (Addendum)
Assessment: Lipid panel from August positive for hypercholesterolemia. ASCVD risk score at that time of 19.5%. Started on lipitor 40 mg 6 months ago. Plan to continue on current regimen and repeat lipid panel today. States he has been adherent, expect 50% drop in LDL.   Plan: -lipid panel pending -continue lipitor 40 mg daily  Addendum: Significant improvement of lipid panel. LDL continues to be elevated, recommend patient continue statin and to work on exercise/diet. Plan to repeat in 1 year.

## 2020-11-26 LAB — LIPID PANEL
Chol/HDL Ratio: 3.6 ratio (ref 0.0–5.0)
Cholesterol, Total: 198 mg/dL (ref 100–199)
HDL: 55 mg/dL (ref >39)
LDL Chol Calc (NIH): 129 mg/dL — ABNORMAL HIGH (ref 0–99)
Triglycerides: 76 mg/dL (ref 0–149)
VLDL Cholesterol Cal: 14 mg/dL (ref 5–40)

## 2020-11-26 NOTE — Progress Notes (Signed)
Internal Medicine Clinic Attending  CCM services provided by the care management provider and their documentation were discussed with Dr. Steen. We reviewed the pertinent findings, urgent action items addressed by the resident and non-urgent items to be addressed by the PCP.  I agree with the assessment, diagnosis, and plan of care documented in the CCM and resident's note.  Emily Mullen, MD 11/26/2020  

## 2020-11-26 NOTE — Progress Notes (Signed)
Internal Medicine Clinic Resident  I have personally reviewed this encounter including the documentation in this note and/or discussed this patient with the care management provider. I will address any urgent items identified by the care management provider and will communicate my actions to the patient's PCP. I have reviewed the patient's CCM visit with my supervising attending, Dr Mullen.  Jeffrey Justis Dupas, MD 11/26/2020    

## 2020-11-27 NOTE — Progress Notes (Signed)
Internal Medicine Clinic Attending  Case discussed with Dr. Katsadouros  At the time of the visit.  We reviewed the resident's history and exam and pertinent patient test results.  I agree with the assessment, diagnosis, and plan of care documented in the resident's note.  

## 2020-12-03 ENCOUNTER — Ambulatory Visit: Payer: Medicare Other | Admitting: Podiatry

## 2020-12-23 ENCOUNTER — Ambulatory Visit: Payer: Medicare Other | Admitting: *Deleted

## 2020-12-23 DIAGNOSIS — C7951 Secondary malignant neoplasm of bone: Secondary | ICD-10-CM

## 2020-12-23 DIAGNOSIS — I10 Essential (primary) hypertension: Secondary | ICD-10-CM

## 2020-12-23 DIAGNOSIS — D509 Iron deficiency anemia, unspecified: Secondary | ICD-10-CM

## 2020-12-23 DIAGNOSIS — E119 Type 2 diabetes mellitus without complications: Secondary | ICD-10-CM

## 2020-12-23 DIAGNOSIS — E782 Mixed hyperlipidemia: Secondary | ICD-10-CM

## 2020-12-23 DIAGNOSIS — C61 Malignant neoplasm of prostate: Secondary | ICD-10-CM

## 2020-12-23 NOTE — Patient Instructions (Signed)
Visit Information It was nice speaking with you today. Congratulations on the improvement in your cholesterol panel and A1C.  PATIENT GOALS: Patient Care Plan: CCM RN Hypertension (Adult)    Problem Identified: Hypertension (Hypertension)     Long-Range Goal: Hypertension Monitored and Managed to meet treatment targets of <140/<90   Start Date: 06/25/2020  Expected End Date: 07/03/2021  Recent Progress: On track  Priority: High  Note:   see longitudinal plan of care for additional care plan information)  Objective:  . Last practice recorded BP readings:  . BP Readings from Last 3 Encounters: .  11/25/20 . 119/83 .  10/28/20 . (!) 135/96 .  06/19/20 . (!) 124/91 .     [name] Most recent eGFR/CrCl: No results found for: EGFR  No components found for: CRCL  Current Barriers:  [name] Knowledge Deficits related to basic understanding of hypertension pathophysiology and self care management- successful outreach to patient via phone to complete follow up assessment, patient very pleased after results of labs drawn 2/22/2 discussed with him, he voiced appreciation to this CCM RN for helping him and keeping him motivated to improve his health.  States he continue to limit sodium in his diet and drinks primarily water or flavored water, he reports good medication taking behavior  Case Manager Clinical Goal(s):  [name] Over the next 30-60 days, patient will verbalize understanding of plan for hypertension management . Over the next 30-60 days, patient will attend all scheduled medical appointments.  Interventions:  . Evaluation of current treatment plan related to hypertension self management and patient's adherence to plan as established by provider. . Reviewed lifestyle strategies to lower blood pressure . Reviewed medications with patient and assessed medication taking behavior . Positive reinforcement given to patient for implementing strategies to improve his blood pressure and for checking his BP twice  daily . Reviewed self monitored blood pressure readings and BP target . Discussed plans with patient for ongoing care management follow up and provided patient with direct contact information for care management team . Reviewed scheduled/upcoming provider appointments including: none at present  Patient Self Care Activities:  . UNABLE to independently manage HTN to meet treatment targets . Self administers medications as prescribed . Attends all scheduled provider appointments . Follows a low sodium diet/DASH diet . - check blood pressure at least daily . - choose a place to take my blood pressure (home, clinic or office, retail store) . - write blood pressure results in a log or diary    Patient Care Plan: CCM RN HLD General Plan of Care (Adult)    Problem Identified: Health Promotion or Disease Self-Management (General Plan of Care) for HLD     Long-Range Goal: Manage lipids to meet treatment targets   Start Date: 06/25/2020  Expected End Date: 07/03/2021  Recent Progress: On track  Priority: Medium  Note:   CARE PLAN ENTRY (see longitudinal plan of care for additional care plan information)  Current Barriers:  . Chronic Disease Management support, education, and care coordination needs related to HLD- successful outreach to patient to complete follow up telephone assessment, patient very pleased when results of lipid panel of 11/25/20 discussed,  he says he is following the nutritional information that was mailed to him by this CCM RN and continues to drink more water or flavored water instead of sweetened sodas and has reduced his consumption of packaged sweets, reports  good medication taking behavior in relation to statin and reports no adverse side effects  Clinical  Goal(s) related to  HLD:  Over the next 30-60 days, patient will:  . Work with the care management team to address educational, disease management, and care coordination needs  . Continue self health monitoring  activities as directed today reduce the amount of sweetened soda consumption from 1/2 liter per day  to none  . Call provider office for new problems and maintain schedule of regular follow up appointments  . Call care management team with questions or concerns . Verbalize basic understanding of patient centered plan of care established today  Interventions related to HLD:  . Evaluation of current treatment plans and patient's adherence to plan as established by provider . Reviewed results of lipid panel of 11/25/20 and much positive reinforcement given for incorporating healthy lifestyle changes that  resulted in improvements in LDL and total cholesterol . Reviewed strategies to get LDL to treatment target of <100 by reducing saturated fat consumption, increasing heart healthy fat consumption such as salmon, tuna, walnuts, almonds and change to peanut, canola or olive oil instead of vegetable oil or lard, and increase consumption of dietary fiber . Assessed statin tolerance and assessed statin medication taking behavior and suggested he take statin in the evening to improve efficacy . Assessed patient's education and care coordination needs . Discussed plans with patient for ongoing care management follow up and ensured patient has direct contact information for care management team  Patient Self Care Activities related to HLD:  . Patient is unable to independently self-manage chronic health conditions . - change to whole grain breads, cereal, pasta . - eat smaller or less servings of red meat . - fill half the plate with nonstarchy vegetables . - increase the amount of fiber in food . - read food labels for fat and fiber    Patient Care Plan: CCM RN- Diabetes Type 2 (Adult)    Problem Identified: Glycemic Management (Diabetes, Type 2)     Long-Range Goal: Glycemic Management Optimized wihtou requiring DM medication per patient goal   Start Date: 11/25/2020  Recent Progress: On track   Priority: High  Note:   Objective:  Lab Results  Component Value Date   HGBA1C 6.3 (A) 11/25/2020 .   Lab Results  Component Value Date   CREATININE 0.90 12/18/2019   CREATININE 0.70 (L) 11/06/2019   CREATININE 0.78 04/10/2019 .   [name] No results found for: EGFR Current Barriers:  [name] Knowledge Deficits related to basic Diabetes pathophysiology and self care/management- successful telephone outreach to patient to complete follow up assessment, he was very please with the improvement in his Hgb A1C . Unable to independently self educate regarding diabetes self management  Case Manager Clinical Goal(s):  . patient will demonstrate improved adherence to prescribed treatment plan for diabetes self care/management as evidenced by: adherence to ADA/ carb modified diet contacting provider for new or worsened symptoms or questions, achieve good glycemic control without need for DM medication Interventions:  . Collaboration with Harvie Heck, MD regarding development and update of comprehensive plan of care as evidenced by provider attestation and co-signature . Inter-disciplinary care team collaboration (see longitudinal plan of care) . Provided education to patient about basic DM disease process . Allowed time for patient to ask questions about DM educational material that was given to him last month . Reviewed results of POC Hgb A1C of 11/25/20, target and correlation to blood sugar . Positive feedback provided to patient for improved Hgb A1C . Review of patient status, including review of consultants reports,  relevant laboratory and other test results, and medications completed. Patient Goals: . Set My Target A1C and manage blood sugar to meet target of <6.5 without requiring DM medications  Self-Care Activities - Attends all scheduled provider appointments Adheres to prescribed ADA/carb modified Follow Up Plan: The care management team will reach out to the patient again over the next 30-60  days.       The patient verbalized understanding of instructions, educational materials, and care plan provided today and declined offer to receive copy of patient instructions, educational materials, and care plan.     Kelli Churn RN, CCM, [facility] RN Care Manager [phone]

## 2020-12-23 NOTE — Chronic Care Management (AMB) (Signed)
Chronic Care Management   CCM RN Visit Note  12/23/2020 Name: William Jones MRN: [id number] DOB: July 07, 1951  Subjective: William Jones is a 70 y.o. year old male who is a primary care patient of Aslam, [name], MD. The care management team was consulted for assistance with disease management and care coordination needs.    Engaged with patient by telephone for follow up visit in response to provider referral for case management and/or care coordination services.   Consent to Services:  The patient was given information about Chronic Care Management services, agreed to services, and gave verbal consent prior to initiation of services.  Please see initial visit note for detailed documentation.   Patient agreed to services and verbal consent obtained.   Assessment: Review of patient past medical history, allergies, medications, health status, including review of consultants reports, laboratory and other test data, was performed as part of comprehensive evaluation and provision of chronic care management services.   SDOH (Social Determinants of Health) assessments and interventions performed:    CCM Care Plan  No Known Allergies  Outpatient Encounter Medications as of 12/23/2020  Medication Sig Note  . amLODipine (NORVASC) 5 MG tablet Take 1 tablet (5 mg total) by mouth daily.   [name] atorvastatin (LIPITOR) 40 MG tablet Take 1 tablet (40 mg total) by mouth daily. 09/11/2020: Patient advised he can get refill of Atorvastatin today 09/11/20  . BETIMOL 0.5 % ophthalmic solution INT 1 GTT IN OD BID   . brimonidine (ALPHAGAN) 0.2 % ophthalmic solution INT 1 GTT INTO THE RIGHT EYE BID   . CALCIUM CITRATE PO Take 600 mg by mouth in the morning and at bedtime.   . cholecalciferol (VITAMIN D3) 10 MCG (400 UNIT) TABS tablet Take 1,000 Units by mouth daily.   . dorzolamide (TRUSOPT) 2 % ophthalmic solution INT 1 GTT IN OD BID   . ferrous sulfate 325 (65 FE) MG tablet Take 1 tablet (325 mg  total) by mouth daily with breakfast.   . hydrochlorothiazide (HYDRODIURIL) 25 MG tablet TAKE 1 TABLET(25 MG) BY MOUTH DAILY   . Multiple Vitamins-Minerals (ONE-A-DAY PROACTIVE 65+) TABS Take 1 tablet by mouth daily.   [name] ofloxacin (OCUFLOX) 0.3 % ophthalmic solution    . prednisoLONE acetate (PRED FORTE) 1 % ophthalmic suspension    . sildenafil (VIAGRA) 100 MG tablet TAKE 1 TABLET BY MOUTH 1 HOURS BEFORE INTERCOURSE (Patient not taking: Reported on 11/25/2020)   . timolol (TIMOPTIC) 0.5 % ophthalmic solution PLACE ONE DROP 2 TIMES INTO THE RIGHT EYE UTD   . triamcinolone (KENALOG) 0.025 % ointment APPLY EXTERNALLY TO THE AFFECTED AREA TWICE DAILY (Patient not taking: Reported on 11/25/2020)   . vitamin E (VITAMIN E) 180 MG (400 UNITS) capsule Take 400 Units by mouth daily.   Gillermina Phy 40 MG capsule Take 160 mg by mouth daily.    No facility-administered encounter medications on file as of 12/23/2020.    Patient Active Problem List   Diagnosis Date Noted  . Fatigue 11/06/2020  . First degree hemorrhoids 10/14/2020  . Thrombosed hemorrhoids 10/14/2020  . Type 2 diabetes mellitus ([place]) 12/18/2019  . Heart murmur 12/18/2019  . Hyperlipidemia 11/20/2019  . Prostate cancer metastatic to bone ([place]) 12/29/2018  . Glaucoma 08/21/2018  . Leukopenia 04/20/2017  . Rectal mass 09/15/2016  . Healthcare maintenance 06/23/2016  . Essential hypertension 07/16/2015  . Erectile dysfunction 01/23/2014  . Iron deficiency anemia 01/08/2014    Conditions to be addressed/monitored:HTN, HLD, NIDDM- no meds, Prostate  Ca with mets to bone, glaucoma  Care Plan : CCM RN Hypertension (Adult)  Updates made by Barrington Ellison, RN since 12/23/2020 12:00 AM    Problem: Hypertension (Hypertension)     Long-Range Goal: Hypertension Monitored and Managed to meet treatment targets of <140/<90   Start Date: 06/25/2020  Expected End Date: 07/03/2021  Recent Progress: On track  Priority: High  Note:   see longitudinal  plan of care for additional care plan information)  Objective:  . Last practice recorded BP readings:  . BP Readings from Last 3 Encounters: .  11/25/20 . 119/83 .  10/28/20 . (!) 135/96 .  06/19/20 . (!) 124/91 .     [name] Most recent eGFR/CrCl: No results found for: EGFR  No components found for: CRCL  Current Barriers:  [name] Knowledge Deficits related to basic understanding of hypertension pathophysiology and self care management- successful outreach to patient via phone to complete follow up assessment, patient very pleased after results of labs drawn 2/22/2 discussed with him, he voiced appreciation to this CCM RN for helping him and keeping him motivated to improve his health.  States he continue to limit sodium in his diet and drinks primarily water or flavored water, he reports good medication taking behavior  Case Manager Clinical Goal(s):  [name] Over the next 30-60 days, patient will verbalize understanding of plan for hypertension management . Over the next 30-60 days, patient will attend all scheduled medical appointments.  Interventions:  . Evaluation of current treatment plan related to hypertension self management and patient's adherence to plan as established by provider. . Reviewed lifestyle strategies to lower blood pressure . Reviewed medications with patient and assessed medication taking behavior . Positive reinforcement given to patient for implementing strategies to improve his blood pressure and for checking his BP twice daily . Reviewed self monitored blood pressure readings and BP targets . Discussed plans with patient for ongoing care management follow up and provided patient with direct contact information for care management team . Reviewed scheduled/upcoming provider appointments including: none at present  Patient Self Care Activities:  . UNABLE to independently manage HTN to meet treatment targets . Self administers medications as prescribed . Attends all scheduled  provider appointments . Follows a low sodium diet/DASH diet . - check blood pressure at least daily . - choose a place to take my blood pressure (home, clinic or office, retail store) . - write blood pressure results in a log or diary    Care Plan : CCM RN HLD General Plan of Care (Adult)  Updates made by Barrington Ellison, RN since 12/23/2020 12:00 AM    Problem: Health Promotion or Disease Self-Management (General Plan of Care) for HLD     Long-Range Goal: Manage lipids to meet treatment targets   Start Date: 06/25/2020  Expected End Date: 07/03/2021  Recent Progress: On track  Priority: Medium  Note:   CARE PLAN ENTRY (see longitudinal plan of care for additional care plan information)  Current Barriers:  . Chronic Disease Management support, education, and care coordination needs related to HLD- successful outreach to patient to complete follow up telephone assessment, patient very pleased when results of lipid panel of 11/25/20 discussed,  he says he is following the nutritional information that was mailed to him by this CCM RN and continues to drink more water or flavored water instead of sweetened sodas and has reduced his consumption of packaged sweets, reports  good medication taking behavior in relation to statin  and reports no adverse side effects  Clinical Goal(s) related to  HLD:  Over the next 30-60 days, patient will:  . Work with the care management team to address educational, disease management, and care coordination needs  . Continue self health monitoring activities as directed today reduce the amount of sweetened soda consumption from 1/2 liter per day  to none  . Call provider office for new problems and maintain schedule of regular follow up appointments  . Call care management team with questions or concerns . Verbalize basic understanding of patient centered plan of care established today  Interventions related to HLD:  . Evaluation of current treatment plans and  patient's adherence to plan as established by provider . Reviewed results of lipid panel of 11/25/20 and much positive reinforcement given for incorporating healthy lifestyle changes that  resulted in improvements in LDL and total cholesterol . Reviewed strategies to get LDL to treatment target of <100 by reducing saturated fat consumption, increasing heart healthy fat consumption such as salmon, tuna, walnuts, almonds and change to peanut, canola or olive oil instead of vegetable oil or lard, and increase consumption of dietary fiber . Assessed statin tolerance and assessed statin medication taking behavior and suggested he take statin in the evening to improve efficacy . Assessed patient's education and care coordination needs . Discussed plans with patient for ongoing care management follow up and ensured patient has direct contact information for care management team  Patient Self Care Activities related to HLD:  . Patient is unable to independently self-manage chronic health conditions . - change to whole grain breads, cereal, pasta . - eat smaller or less servings of red meat . - fill half the plate with nonstarchy vegetables . - increase the amount of fiber in food . - read food labels for fat and fiber    Care Plan : CCM RN- Diabetes Type 2 (Adult)  Updates made by Barrington Ellison, RN since 12/23/2020 12:00 AM    Problem: Glycemic Management (Diabetes, Type 2)     Long-Range Goal: Glycemic Management Optimized wihtou requiring DM medication per patient goal   Start Date: 11/25/2020  Recent Progress: On track  Priority: High  Note:   Objective:  Lab Results  Component Value Date   HGBA1C 6.3 (A) 11/25/2020 .   Lab Results  Component Value Date   CREATININE 0.90 12/18/2019   CREATININE 0.70 (L) 11/06/2019   CREATININE 0.78 04/10/2019 .   [name] No results found for: EGFR Current Barriers:  [name] Knowledge Deficits related to basic Diabetes pathophysiology and self care/management-  successful telephone outreach to patient to complete follow up assessment, he was very please with the improvement in his Hgb A1C . Unable to independently self educate regarding diabetes self management  Case Manager Clinical Goal(s):  . patient will demonstrate improved adherence to prescribed treatment plan for diabetes self care/management as evidenced by: adherence to ADA/ carb modified diet contacting provider for new or worsened symptoms or questions, achieve good glycemic control without need for DM medication Interventions:  . Collaboration with Harvie Heck, MD regarding development and update of comprehensive plan of care as evidenced by provider attestation and co-signature . Inter-disciplinary care team collaboration (see longitudinal plan of care) . Provided education to patient about basic DM disease process . Allowed time for patient to ask questions about DM educational material that was given to him last month . Reviewed results of POC Hgb A1C of 11/25/20, target and correlation to blood sugar .  Positive feedback provided to patient for improved Hgb A1C . Review of patient status, including review of consultants reports, relevant laboratory and other test results, and medications completed. Patient Goals: . Set My Target A1C and manage blood sugar to meet target of <6.5 without requiring DM medications  Self-Care Activities - Attends all scheduled provider appointments Adheres to prescribed ADA/carb modified Follow Up Plan: The care management team will reach out to the patient again over the next 30-60 days.      Kelli Churn RN, CCM, [facility] RN Care Manager [phone]

## 2020-12-29 NOTE — Progress Notes (Signed)
Internal Medicine Clinic Resident  I have personally reviewed this encounter including the documentation in this note and/or discussed this patient with the care management provider. I will address any urgent items identified by the care management provider and will communicate my actions to the patient's PCP. I have reviewed the patient's CCM visit with my supervising attending, Dr Heber [place].  Mitzi Hansen, MD Internal Medicine Resident PGY-2 [facility] Pager: [phone] 12/29/2020 11:52 AM

## 2021-01-20 ENCOUNTER — Telehealth: Payer: Medicare Other

## 2021-01-23 ENCOUNTER — Ambulatory Visit: Payer: Medicare Other | Admitting: *Deleted

## 2021-01-23 DIAGNOSIS — I10 Essential (primary) hypertension: Secondary | ICD-10-CM

## 2021-01-23 DIAGNOSIS — E119 Type 2 diabetes mellitus without complications: Secondary | ICD-10-CM

## 2021-01-23 DIAGNOSIS — E782 Mixed hyperlipidemia: Secondary | ICD-10-CM

## 2021-01-23 DIAGNOSIS — C61 Malignant neoplasm of prostate: Secondary | ICD-10-CM

## 2021-01-23 DIAGNOSIS — D509 Iron deficiency anemia, unspecified: Secondary | ICD-10-CM

## 2021-01-23 NOTE — Patient Instructions (Signed)
Visit Information It was nice speaking with you today. Goals Addressed              This Visit's Progress     Patient Stated   .  Set My Target A1C-Diabetes Type 2 and manage blood sugar to meet target without requiring DM medications (pt-stated)        Timeframe:  Short-Term Goal Priority:  High Start Date:       11/25/20                      Expected End Date:   ongoing                    Follow Up Date 01/20/21   - set target A1C at <6.5%    Why is this important?    Your target A1C is decided together by you and your doctor.   It is based on several things like your age and other health issues.    Notes: Meeting goal; Hgb A1C= 6.3% on 11/25/20 without DM medications, following CHO controlled meal plan      Other   .  Make and Keep All Appointments        Timeframe:  Long-Range Goal Priority:  High Start Date:          10/14/20                   Expected End Date:       ongoing                Follow Up Date 02/24/21   - call to cancel if needed - keep a calendar with appointment dates    Why is this important?    Part of staying healthy is seeing the doctor for follow-up care.   If you forget your appointments, there are some things you can do to stay on track.    Notes: Patient  meeting goal    .  Manage My Cholesterol        Timeframe:  Long-Range Goal Priority:  Medium Start Date:  06/25/20                           Expected End Date:    ongoing                   Follow Up Date 02/24/21   - change to whole grain breads, cereal, pasta - eat smaller or less servings of red meat - fill half the plate with nonstarchy vegetables - get blood test (fasting) done 1 week before next visit - increase the amount of fiber in food - read food labels for fat and fiber    Why is this important?   Changing cholesterol starts with eating heart-healthy foods.  Other steps may be to increase your activity and to quit if you smoke.    Notes: patient meeting goal; lipid  panel done 11/25/20 showed improvement in total cholesterol and LDL, patient following low Na , fat modified meal plan       The patient verbalized understanding of instructions, educational materials, and care plan provided today and declined offer to receive copy of patient instructions, educational materials, and care plan.   The care management team will reach out to the patient again over the next 30-60 days.   Kelli Churn RN, CCM, [facility] RN Care Manager [phone]

## 2021-01-23 NOTE — Chronic Care Management (AMB) (Signed)
Care Management    RN Visit Note  01/23/2021 Name: William Jones MRN: [id number] DOB: 08-28-1951  Subjective: William Jones is a 70 y.o. year old male who is a primary care patient of Aslam, [name], MD. The care management team was consulted for assistance with disease management and care coordination needs.    Engaged with patient by telephone for follow up visit in response to provider referral for case management and/or care coordination services.   Consent to Services:   Mr. Leichter was given information about Care Management services today including:  1. Care Management services includes personalized support from designated clinical staff supervised by his physician, including individualized plan of care and coordination with other care providers 2. 24/7 contact phone numbers for assistance for urgent and routine care needs. 3. The patient may stop case management services at any time by phone call to the office staff.  Patient agreed to services and consent obtained.   Assessment: Review of patient past medical history, allergies, medications, health status, including review of consultants reports, laboratory and other test data, was performed as part of comprehensive evaluation and provision of chronic care management services.   SDOH (Social Determinants of Health) assessments and interventions performed:    Care Plan  No Known Allergies  Outpatient Encounter Medications as of 01/23/2021  Medication Sig Note  . amLODipine (NORVASC) 5 MG tablet Take 1 tablet (5 mg total) by mouth daily.   [name] atorvastatin (LIPITOR) 40 MG tablet Take 1 tablet (40 mg total) by mouth daily. 09/11/2020: Patient advised he can get refill of Atorvastatin today 09/11/20  . BETIMOL 0.5 % ophthalmic solution INT 1 GTT IN OD BID   . brimonidine (ALPHAGAN) 0.2 % ophthalmic solution INT 1 GTT INTO THE RIGHT EYE BID   . CALCIUM CITRATE PO Take 600 mg by mouth in the morning and at bedtime.    . cholecalciferol (VITAMIN D3) 10 MCG (400 UNIT) TABS tablet Take 1,000 Units by mouth daily.   . dorzolamide (TRUSOPT) 2 % ophthalmic solution INT 1 GTT IN OD BID   . ferrous sulfate 325 (65 FE) MG tablet Take 1 tablet (325 mg total) by mouth daily with breakfast.   . hydrochlorothiazide (HYDRODIURIL) 25 MG tablet TAKE 1 TABLET(25 MG) BY MOUTH DAILY   . Multiple Vitamins-Minerals (ONE-A-DAY PROACTIVE 65+) TABS Take 1 tablet by mouth daily.   [name] ofloxacin (OCUFLOX) 0.3 % ophthalmic solution    . prednisoLONE acetate (PRED FORTE) 1 % ophthalmic suspension    . sildenafil (VIAGRA) 100 MG tablet TAKE 1 TABLET BY MOUTH 1 HOURS BEFORE INTERCOURSE (Patient not taking: Reported on 11/25/2020)   . timolol (TIMOPTIC) 0.5 % ophthalmic solution PLACE ONE DROP 2 TIMES INTO THE RIGHT EYE UTD   . triamcinolone (KENALOG) 0.025 % ointment APPLY EXTERNALLY TO THE AFFECTED AREA TWICE DAILY (Patient not taking: Reported on 11/25/2020)   . vitamin E (VITAMIN E) 180 MG (400 UNITS) capsule Take 400 Units by mouth daily.   Gillermina Phy 40 MG capsule Take 160 mg by mouth daily.    No facility-administered encounter medications on file as of 01/23/2021.    Patient Active Problem List   Diagnosis Date Noted  . Fatigue 11/06/2020  . First degree hemorrhoids 10/14/2020  . Thrombosed hemorrhoids 10/14/2020  . Type 2 diabetes mellitus ([place]) 12/18/2019  . Heart murmur 12/18/2019  . Hyperlipidemia 11/20/2019  . Prostate cancer metastatic to bone ([place]) 12/29/2018  . Glaucoma 08/21/2018  . Leukopenia 04/20/2017  .  Rectal mass 09/15/2016  . Healthcare maintenance 06/23/2016  . Essential hypertension 07/16/2015  . Erectile dysfunction 01/23/2014  . Iron deficiency anemia 01/08/2014    Conditions to be addressed/monitored: HTN, HLD, NIDDM- no meds, Prostate Ca with mets to bone, glaucoma  Care Plan : CCM RN Hypertension (Adult)  Updates made by Barrington Ellison, RN since 01/23/2021 12:00 AM    Problem: Hypertension  (Hypertension)     Long-Range Goal: Hypertension Monitored and Managed to meet treatment targets of <140/<90   Start Date: 06/25/2020  Expected End Date: 07/03/2021  Recent Progress: On track  Priority: High  Note:   see longitudinal plan of care for additional care plan information)  Objective:  . Last practice recorded BP readings:  . BP Readings from Last 3 Encounters: .  11/25/20 . 119/83 .  10/28/20 . (!) 135/96 .  06/19/20 . (!) 124/91 .     [name] Most recent eGFR/CrCl: No results found for: EGFR  No components found for: CRCL  Current Barriers:  [name] Knowledge Deficits related to basic understanding of hypertension pathophysiology and self care management- successful outreach to patient via phone to complete follow up assessment, patient reports all home monitored BP readings are meeting treatment targets, reading this morning was 118/80,  he again voiced appreciation to this CCM RN for helping him and keeping him motivated to improve his health.  States he continue to limit sodium in his diet and drinks primarily water or flavored water, he reports good medication taking behavior and no c/o adverse side effects, states he will make clinic follow up in June or July as directed by clinic provider on 11/25/20  Case Manager Clinical Goal(s):  [name] Over the next 30-60 days, patient will verbalize understanding of plan for hypertension management . Over the next 30-60 days, patient will attend all scheduled medical appointments.  Interventions:  . Evaluation of current treatment plan related to hypertension self management and patient's adherence to plan as established by provider. . Reviewed lifestyle strategies to lower blood pressure . Reviewed medications with patient and assessed medication taking behavior . Positive reinforcement given to patient for implementing strategies to improve his blood pressure and for checking his BP twice daily . Reviewed self monitored blood pressure readings  and BP target . Discussed plans with patient for ongoing care management follow up and provided patient with direct contact information for care management team . Reviewed scheduled/upcoming provider appointments including: none at present, discussed clinic follow up due between June and August   Patient Self Care Activities:  . UNABLE to independently manage HTN to meet treatment targets . Self administers medications as prescribed . Attends all scheduled provider appointments . Follows a low sodium diet/DASH diet . - check blood pressure at least daily . - choose a place to take my blood pressure (home, clinic or office, retail store) . - write blood pressure results in a log or diary    Care Plan : CCM RN HLD General Plan of Care (Adult)  Updates made by Barrington Ellison, RN since 01/23/2021 12:00 AM    Problem: Health Promotion or Disease Self-Management (General Plan of Care) for HLD     Long-Range Goal: Manage lipids to meet treatment targets   Start Date: 06/25/2020  Expected End Date: 07/03/2021  Recent Progress: On track  Priority: Medium  Note:   CARE PLAN ENTRY (see longitudinal plan of care for additional care plan information)  Current Barriers:  . Chronic Disease Management support, education,  and care coordination needs related to HLD- successful outreach to patient to complete follow up telephone assessment,  he says he continues to follow the nutritional information that was mailed to him by this CCM RN and continues to drink more water or flavored water instead of sweetened sodas and has reduced his consumption of packaged sweets, reports  good medication taking behavior in relation to statin and reports no adverse side effects  Clinical Goal(s) related to  HLD:  Over the next 30-60 days, patient will:  . Work with the care management team to address educational, disease management, and care coordination needs  . Continue self health monitoring activities as directed  today reduce the amount of sweetened soda consumption from 1/2 liter per day  to none  . Call provider office for new problems and maintain schedule of regular follow up appointments  . Call care management team with questions or concerns . Verbalize basic understanding of patient centered plan of care established today  Interventions related to HLD:  . Evaluation of current treatment plans and patient's adherence to plan as established by provider . Again reviewed strategies to get LDL to treatment target of <100 by reducing saturated fat consumption, increasing heart healthy fat consumption such as salmon, tuna, walnuts, almonds and change to peanut, canola or olive oil instead of vegetable oil or lard, and increase consumption of dietary fiber . Assessed statin tolerance and assessed statin medication taking behavior and suggested he take statin in the evening to improve efficacy . Assessed patient's education and care coordination needs . Discussed plans with patient for ongoing care management follow up and ensured patient has direct contact information for care management team  Patient Self Care Activities related to HLD:  . Patient is unable to independently self-manage chronic health conditions . - change to whole grain breads, cereal, pasta . - eat smaller or less servings of red meat . - fill half the plate with nonstarchy vegetables . - increase the amount of fiber in food . - read food labels for fat and fiber    Care Plan : CCM RN- Diabetes Type 2 (Adult)  Updates made by Barrington Ellison, RN since 01/23/2021 12:00 AM    Problem: Glycemic Management (Diabetes, Type 2)     Long-Range Goal: Glycemic Management Optimized without requiring DM medication per patient goal   Start Date: 11/25/2020  Recent Progress: On track  Priority: High  Note:   Objective:  Lab Results  Component Value Date   HGBA1C 6.3 (A) 11/25/2020 .   Lab Results  Component Value Date   CREATININE 0.90  12/18/2019   CREATININE 0.70 (L) 11/06/2019   CREATININE 0.78 04/10/2019 .   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 Unable to independently self educate regarding diabetes self management  Case Manager Clinical Goal(s):  . patient will demonstrate improved adherence to prescribed treatment plan for diabetes self care/management as evidenced by: adherence to ADA/ carb modified diet contacting provider for new or worsened symptoms or questions, achieve good glycemic control without need for DM medication Interventions:  . Collaboration with Harvie Heck, MD regarding development and update of comprehensive plan of care as evidenced by provider attestation and  co-signature . Inter-disciplinary care team collaboration (see longitudinal plan of care) . Provided education to patient about basic DM disease process . Allowed time for patient to ask questions about DM educational material that was given to him last month . Reviewed results of POC Hgb A1C of 11/25/20, target and correlation to blood sugar . Positive feedback provided to patient for improved Hgb A1C . Review of patient status, including review of consultants reports, relevant laboratory and other test results, and medications completed. Patient Goals: . Set My Target A1C and manage blood sugar to meet target of <6.5 without requiring DM medications  Self-Care Activities - Attends all scheduled provider appointments Adheres to prescribed ADA/carb modified Follow Up Plan: The care management team will reach out to the patient again over the next 30-60 days.       Kelli Churn RN, CCM, [facility] RN Care  Manager [phone]

## 2021-02-12 ENCOUNTER — Other Ambulatory Visit: Payer: Self-pay | Admitting: *Deleted

## 2021-02-13 MED ORDER — TRIAMCINOLONE ACETONIDE 0.025 % EX OINT: TOPICAL_OINTMENT | CUTANEOUS | 2 refills | Status: DC

## 2021-02-23 ENCOUNTER — Ambulatory Visit: Payer: Medicare Other | Admitting: *Deleted

## 2021-02-23 DIAGNOSIS — D509 Iron deficiency anemia, unspecified: Secondary | ICD-10-CM

## 2021-02-23 DIAGNOSIS — C7951 Secondary malignant neoplasm of bone: Secondary | ICD-10-CM

## 2021-02-23 DIAGNOSIS — I10 Essential (primary) hypertension: Secondary | ICD-10-CM

## 2021-02-23 DIAGNOSIS — E119 Type 2 diabetes mellitus without complications: Secondary | ICD-10-CM

## 2021-02-23 DIAGNOSIS — E782 Mixed hyperlipidemia: Secondary | ICD-10-CM

## 2021-02-23 DIAGNOSIS — C61 Malignant neoplasm of prostate: Secondary | ICD-10-CM

## 2021-02-23 NOTE — Patient Instructions (Signed)
Visit Information It was nice speaking with you today. Goals Addressed              This Visit's Progress     Patient Stated   .  Set My Target A1C-Diabetes Type 2 and manage blood sugar to meet target without requiring DM medications (pt-stated)        Timeframe:  Short-Term Goal Priority:  High Start Date:       11/25/20                      Expected End Date:   ongoing                    Follow Up Date 04/02/21   - set target A1C at <6.5%    Why is this important?    Your target A1C is decided together by you and your doctor.   It is based on several things like your age and other health issues.    Notes: Meeting goal; Hgb A1C= 6.3% on 11/25/20 without DM medications, following CHO controlled meal plan      Other   .  Make and Keep All Appointments        Timeframe:  Long-Range Goal Priority:  High Start Date:          10/14/20                   Expected End Date:       ongoing                Follow Up Date 04/02/21   - call to cancel if needed - keep a calendar with appointment dates    Why is this important?    Part of staying healthy is seeing the doctor for follow-up care.   If you forget your appointments, there are some things you can do to stay on track.    Notes: Patient  meeting goal    .  Manage My Cholesterol        Timeframe:  Long-Range Goal Priority:  Medium Start Date:  06/25/20                           Expected End Date:    ongoing                   Follow Up Date 04/02/21   - change to whole grain breads, cereal, pasta - eat smaller or less servings of red meat - fill half the plate with nonstarchy vegetables - get blood test (fasting) done 1 week before next visit - increase the amount of fiber in food - read food labels for fat and fiber    Why is this important?   Changing cholesterol starts with eating heart-healthy foods.  Other steps may be to increase your activity and to quit if you smoke.    Notes: patient meeting goal; lipid  panel done 11/25/20 showed improvement in total cholesterol and LDL, patient following low Na , fat modified meal plan    .  Track and Manage My Blood Pressure-Hypertension        Timeframe:  Long-Range Goal Priority:  High Start Date:         06/25/20                    Expected End Date:  Follow Up Date 04/02/21   - check blood pressure daily - choose a place to take my blood pressure (home, clinic or office, retail store) - write blood pressure results in a log or diary    Why is this important?    You won't feel high blood pressure, but it can still hurt your blood vessels.   High blood pressure can cause heart or kidney problems. It can also cause a stroke.   Making lifestyle changes like losing a little weight or eating less salt will help.   Checking your blood pressure at home and at different times of the day can help to control blood pressure.   If the doctor prescribes medicine remember to take it the way the doctor ordered.   Call the office if you cannot afford the medicine or if there are questions about it.     Notes: Systolic blood pressure meeting goal but diastolic runs 63-84 despite good medication taking behavior and low Na diet       The patient verbalized understanding of instructions, educational materials, and care plan provided today and declined offer to receive copy of patient instructions, educational materials, and care plan.   The care management team will reach out to the patient again over the next 30-60 days.   Kelli Churn RN, CCM, [facility] RN Care Manager [phone]

## 2021-02-23 NOTE — Chronic Care Management (AMB) (Signed)
Care Management    RN Visit Note  02/23/2021 Name: William Jones MRN: [id number] DOB: Jun 28, 1951  Subjective: William Jones is a 70 y.o. year old male who is a primary care patient of William, [name], MD. The care management team was consulted for assistance with disease management and care coordination needs.    Engaged with patient by telephone for follow up visit in response to provider referral for case management and/or care coordination services.   Consent to Services:   William Jones was given information about Care Management services today including:  1. Care Management services includes personalized support from designated clinical staff supervised by his physician, including individualized plan of care and coordination with other care providers 2. 24/7 contact phone numbers for assistance for urgent and routine care needs. 3. The patient may stop case management services at any time by phone call to the office staff.  Patient agreed to services and consent obtained.   Assessment: Review of patient past medical history, allergies, medications, health status, including review of consultants reports, laboratory and other test data, was performed as part of comprehensive evaluation and provision of chronic care management services.   SDOH (Social Determinants of Health) assessments and interventions performed:    Care Plan  No Known Allergies  Outpatient Encounter Medications as of 02/23/2021  Medication Sig Note  . amLODipine (NORVASC) 5 MG tablet Take 1 tablet (5 mg total) by mouth daily.   William Jones atorvastatin (LIPITOR) 40 MG tablet Take 1 tablet (40 mg total) by mouth daily. 09/11/2020: Patient advised he can get refill of Atorvastatin today 09/11/20  . BETIMOL 0.5 % ophthalmic solution INT 1 GTT IN OD BID   . brimonidine (ALPHAGAN) 0.2 % ophthalmic solution INT 1 GTT INTO THE RIGHT EYE BID   . CALCIUM CITRATE PO Take 600 mg by mouth in the morning and at bedtime.    . cholecalciferol (VITAMIN D3) 10 MCG (400 UNIT) TABS tablet Take 1,000 Units by mouth daily.   . dorzolamide (TRUSOPT) 2 % ophthalmic solution INT 1 GTT IN OD BID   . ferrous sulfate 325 (65 FE) MG tablet Take 1 tablet (325 mg total) by mouth daily with breakfast.   . hydrochlorothiazide (HYDRODIURIL) 25 MG tablet TAKE 1 TABLET(25 MG) BY MOUTH DAILY   . Multiple Vitamins-Minerals (ONE-A-DAY PROACTIVE 65+) TABS Take 1 tablet by mouth daily.   William Jones ofloxacin (OCUFLOX) 0.3 % ophthalmic solution    . prednisoLONE acetate (PRED FORTE) 1 % ophthalmic suspension    . sildenafil (VIAGRA) 100 MG tablet TAKE 1 TABLET BY MOUTH 1 HOURS BEFORE INTERCOURSE (Patient not taking: Reported on 11/25/2020)   . timolol (TIMOPTIC) 0.5 % ophthalmic solution PLACE ONE DROP 2 TIMES INTO THE RIGHT EYE UTD   . triamcinolone (KENALOG) 0.025 % ointment APPLY EXTERNALLY TO THE AFFECTED AREA TWICE DAILY   . vitamin E (VITAMIN E) 180 MG (400 UNITS) capsule Take 400 Units by mouth daily.   Gillermina Phy 40 MG capsule Take 160 mg by mouth daily.    No facility-administered encounter medications on file as of 02/23/2021.    Patient Active Problem List   Diagnosis Date Noted  . Fatigue 11/06/2020  . First degree hemorrhoids 10/14/2020  . Thrombosed hemorrhoids 10/14/2020  . Type 2 diabetes mellitus ([place]) 12/18/2019  . Heart murmur 12/18/2019  . Hyperlipidemia 11/20/2019  . Prostate cancer metastatic to bone ([place]) 12/29/2018  . Glaucoma 08/21/2018  . Leukopenia 04/20/2017  . Rectal mass 09/15/2016  . Healthcare  maintenance 06/23/2016  . Essential hypertension 07/16/2015  . Erectile dysfunction 01/23/2014  . Iron deficiency anemia 01/08/2014    Conditions to be addressed/monitored: HTN, HLD, NIDDM- no meds, Prostate Ca with mets to bone  Care Plan : CCM RN Hypertension (Adult)  Updates made by Barrington Ellison, RN since 02/23/2021 12:00 AM    Problem: Hypertension (Hypertension)     Long-Range Goal: Hypertension Monitored  and Managed to meet treatment targets of <140/<90   Start Date: 06/25/2020  Expected End Date: 07/03/2021  This Visit's Progress: Not on track  Recent Progress: On track  Priority: High  Note:   see longitudinal plan of care for additional care plan information)  Objective:  . Last practice recorded BP readings:  . BP Readings from Last 3 Encounters: .  11/25/20 . 119/83 .  10/28/20 . (!) 135/96 .  06/19/20 . (!) 124/91 .     William Jones Most recent eGFR/CrCl: No results found for: EGFR  No components found for: CRCL  Current Barriers:  William Jones Knowledge Deficits related to basic understanding of hypertension pathophysiology and self care management- successful outreach to patient via phone to complete follow up assessment, patient reports home monitored BP readings are meeting systolic treatment targets but diastolic usually 93-11, reports reading this morning 120/90,  he again voiced appreciation to this CCM RN for helping him and keeping him motivated to improve his health.  States he continue to limit sodium in his diet and drinks primarily water or flavored water, he reports good medication taking behavior and no c/o adverse side effects, denies bone pain from metastatic prostate cancer, states he will make clinic follow up in June or July as directed by clinic provider on 11/25/20  Case Manager Clinical Goal(s):  William Jones Over the next 30-60 days, patient will verbalize understanding of plan for hypertension management . Over the next 30-60 days, patient will attend all scheduled medical appointments.  Interventions:  . Evaluation of current treatment plan related to hypertension self management and patient's adherence to plan as established by provider. . Reviewed lifestyle strategies to lower blood pressure . Reviewed medications with patient and assessed medication taking behavior . Positive reinforcement given to patient for implementing strategies to improve his blood pressure and for checking his BP  twice daily . Reviewed self monitored blood pressure readings and BP target . Discussed plans with patient for ongoing care management follow up and provided patient with direct contact information for care management team . Reviewed scheduled/upcoming provider appointments including: none at present, discussed clinic follow up due between June and August   Patient Self Care Activities:  . UNABLE to independently manage HTN to meet treatment targets . Self administers medications as prescribed . Attends all scheduled provider appointments . Follows a low sodium diet/DASH diet . - check blood pressure at least daily . - choose a place to take my blood pressure (home, clinic or office, retail store) . - write blood pressure results in a log or diary    Care Plan : CCM RN- Diabetes Type 2 (Adult)  Updates made by Barrington Ellison, RN since 02/23/2021 12:00 AM    Problem: Glycemic Management (Diabetes, Type 2)     Long-Range Goal: Glycemic Management Optimized without requiring DM medication per patient goal   Start Date: 11/25/2020  Recent Progress: On track  Priority: High  Note:   Objective:  Lab Results  Component Value Date   HGBA1C 6.3 (A) 11/25/2020 .   Lab Results  Component  Value Date   CREATININE 0.90 12/18/2019   CREATININE 0.70 (L) 11/06/2019   CREATININE 0.78 04/10/2019 .   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 Unable to independently self educate regarding diabetes self management  Case Manager Clinical Goal(s):  . patient will demonstrate improved adherence to prescribed treatment plan for diabetes self  care/management as evidenced by: adherence to ADA/ carb modified diet contacting provider for new or worsened symptoms or questions, achieve good glycemic control without need for DM medication Interventions:  . Collaboration with Harvie Heck, MD regarding development and update of comprehensive plan of care as evidenced by provider attestation and co-signature . Inter-disciplinary care team collaboration (see longitudinal plan of care) . Allowed time for questions r/t diabetes and controlling diabetes without the use of medications  . Review of patient status, including review of consultants reports, relevant laboratory and other test results, and medications completed. Patient Goals: . Set My Target A1C and manage blood sugar to meet target of <6.5 without requiring DM medications  Self-Care Activities - Attends all scheduled provider appointments Adheres to prescribed ADA/carb modified Follow Up Plan: The care management team will reach out to the patient again over the next 30-60 days.       Kelli Churn RN, CCM, [facility] RN Care Manager [phone]

## 2021-03-30 ENCOUNTER — Ambulatory Visit: Payer: Medicare Other | Admitting: *Deleted

## 2021-03-30 DIAGNOSIS — I10 Essential (primary) hypertension: Secondary | ICD-10-CM

## 2021-03-30 DIAGNOSIS — D509 Iron deficiency anemia, unspecified: Secondary | ICD-10-CM

## 2021-03-30 DIAGNOSIS — E119 Type 2 diabetes mellitus without complications: Secondary | ICD-10-CM

## 2021-03-30 DIAGNOSIS — C61 Malignant neoplasm of prostate: Secondary | ICD-10-CM

## 2021-03-30 DIAGNOSIS — E782 Mixed hyperlipidemia: Secondary | ICD-10-CM

## 2021-03-30 NOTE — Chronic Care Management (AMB) (Signed)
Care Management    RN Visit Note  03/30/2021 Name: William Jones MRN: [id number] DOB: [birth year]  Subjective: BURNS William Jones is a 70 y.o. year old male who is a primary care patient of Aslam, [name], MD. The care management team was consulted for assistance with disease management and care coordination needs.    Engaged with patient by telephone for follow up visit in response to provider referral for case management and/or care coordination services.   Consent to Services:   [name] was given information about Care Management services today including:  Care Management services includes personalized support from designated clinical staff supervised by his physician, including individualized plan of care and coordination with other care providers 24/7 contact phone numbers for assistance for urgent and routine care needs. The patient may stop case management services at any time by phone call to the office staff.  Patient agreed to services and consent obtained.   Assessment: Review of patient past medical history, allergies, medications, health status, including review of consultants reports, laboratory and other test data, was performed as part of comprehensive evaluation and provision of chronic care management services.   SDOH (Social Determinants of Health) assessments and interventions performed:    Care Plan  No Known Allergies  Outpatient Encounter Medications as of 03/30/2021  Medication Sig Note   amLODipine (NORVASC) 5 MG tablet Take 1 tablet (5 mg total) by mouth daily.    atorvastatin (LIPITOR) 40 MG tablet Take 1 tablet (40 mg total) by mouth daily. 09/11/2020: Patient advised he can get refill of Atorvastatin today 09/11/20   BETIMOL 0.5 % ophthalmic solution INT 1 GTT IN OD BID    brimonidine (ALPHAGAN) 0.2 % ophthalmic solution INT 1 GTT INTO THE RIGHT EYE BID    CALCIUM CITRATE PO Take 600 mg by mouth in the morning and at bedtime.     cholecalciferol (VITAMIN D3) 10 MCG (400 UNIT) TABS tablet Take 1,000 Units by mouth daily.    dorzolamide (TRUSOPT) 2 % ophthalmic solution INT 1 GTT IN OD BID    ferrous sulfate 325 (65 FE) MG tablet Take 1 tablet (325 mg total) by mouth daily with breakfast.    hydrochlorothiazide (HYDRODIURIL) 25 MG tablet TAKE 1 TABLET(25 MG) BY MOUTH DAILY    Multiple Vitamins-Minerals (ONE-A-DAY PROACTIVE 65+) TABS Take 1 tablet by mouth daily.    ofloxacin (OCUFLOX) 0.3 % ophthalmic solution     prednisoLONE acetate (PRED FORTE) 1 % ophthalmic suspension     sildenafil (VIAGRA) 100 MG tablet TAKE 1 TABLET BY MOUTH 1 HOURS BEFORE INTERCOURSE (Patient not taking: Reported on 11/25/2020)    timolol (TIMOPTIC) 0.5 % ophthalmic solution PLACE ONE DROP 2 TIMES INTO THE RIGHT EYE UTD    triamcinolone (KENALOG) 0.025 % ointment APPLY EXTERNALLY TO THE AFFECTED AREA TWICE DAILY    vitamin E (VITAMIN E) 180 MG (400 UNITS) capsule Take 400 Units by mouth daily.    XTANDI 40 MG capsule Take 160 mg by mouth daily.    No facility-administered encounter medications on file as of 03/30/2021.    Patient Active Problem List   Diagnosis Date Noted   Fatigue 11/06/2020   First degree hemorrhoids 10/14/2020   Thrombosed hemorrhoids 10/14/2020   Type 2 diabetes mellitus (William Jones) 12/18/2019   Heart murmur 12/18/2019   Hyperlipidemia 11/20/2019   Prostate cancer metastatic to bone ([place]) 12/29/2018   Glaucoma 08/21/2018   Leukopenia 04/20/2017   Rectal mass 09/15/2016   Healthcare maintenance 06/23/2016  Essential hypertension 07/16/2015   Erectile dysfunction 01/23/2014   Iron deficiency anemia 01/08/2014    Conditions to be addressed/monitored: HTN, HLD, NIDDM- no meds, Prostate Ca with mets to bone, glaucoma  Care Plan : CCM RN Hypertension (Adult)  Updates made by Barrington Ellison, RN since 03/30/2021 12:00 AM     Problem: Hypertension (Hypertension)      Long-Range Goal: Hypertension Monitored and Managed to  meet treatment targets of <130/<80   Start Date: 06/25/2020  Expected End Date: 07/03/2021  Recent Progress: Not on track  Priority: High  Note:   see longitudinal plan of care for additional care plan information)  Objective:  Last practice recorded BP readings:  BP Readings from Last 3 Encounters:  11/25/20 119/83  10/28/20 (!) 135/96  06/19/20 (!) 124/91     Most recent eGFR/CrCl: No results found for: EGFR  No components found for: CRCL  Current Barriers:  Knowledge Deficits related to basic understanding of hypertension pathophysiology and self care management- successful outreach to patient via phone to complete follow up assessment, patient reports home monitored BP readings continue to meet systolic treatment targets but diastolic readings remain usually 90-91, reports reading this morning 118/90,  he again voiced appreciation to this CCM RN for helping him and keeping him motivated to improve his health.  States he continue to limit sodium in his diet and drinks primarily water or flavored water, he reports good medication taking behavior and no c/o adverse side effects, denies bone pain from metastatic prostate cancer and says he will see Dr. [name] or his nurse on 04/03/21, states he will call clinic today to make follow up appointment for July  Case Manager Clinical Goal(s):  Over the next 30-60 days, patient will verbalize understanding of plan for hypertension management Over the next 30-60 days, patient will attend all scheduled medical appointments.  Interventions:  Evaluation of current treatment plan related to hypertension self management and patient's adherence to plan as established by provider. Reviewed lifestyle strategies to lower blood pressure Reviewed medications with patient and assessed medication taking behavior Positive reinforcement given to patient for implementing strategies to improve his blood pressure and for checking his BP twice daily Reviewed self  monitored blood pressure readings and BP target Discussed plans with patient for ongoing care management follow up and provided patient with direct contact information for care management team Reviewed scheduled/upcoming provider appointments including: none at present, discussed clinic follow up due between June and August   Patient Self Care Activities:  UNABLE to independently manage HTN to meet treatment targets Self administers medications as prescribed Attends all scheduled provider appointments Follows a low sodium diet/DASH diet - check blood pressure at least daily - choose a place to take my blood pressure (home, clinic or office, retail store) - write blood pressure results in a log or diary     Care Plan : CCM RN- Diabetes Type 2 (Adult)  Updates made by Barrington Ellison, RN since 03/30/2021 12:00 AM     Problem: Glycemic Management (Diabetes, Type 2)      Long-Range Goal: Glycemic Management Optimized without requiring DM medication per patient goal   Start Date: 11/25/2020  Recent Progress: On track  Priority: High  Note:   Objective:  Lab Results  Component Value Date   HGBA1C 6.3 (A) 11/25/2020   Lab Results  Component Value Date   CREATININE 0.90 12/18/2019   CREATININE 0.70 (L) 11/06/2019   CREATININE 0.78 04/10/2019   No results  found for: EGFR Current Barriers:  Knowledge Deficits related to basic Diabetes pathophysiology and self care/management- successful telephone outreach to patient to complete follow up assessment, states he continues to follow [facility] controlled meal plan and 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[employer], denies bone pain from metastatic prostate cancer Unable to independently self educate regarding diabetes self management  Case Manager Clinical Goal(s):  patient will demonstrate improved adherence to prescribed treatment plan for diabetes self care/management as evidenced by: adherence to ADA/ carb  modified diet contacting provider for new or worsened symptoms or questions, achieve good glycemic control without need for DM medication Interventions:  Collaboration with Harvie Heck, MD regarding development and update of comprehensive plan of care as evidenced by provider attestation and co-signature Inter-disciplinary care team collaboration (see longitudinal plan of care) Allowed time for questions r/t diabetes and controlling diabetes without the use of medications  Review of patient status, including review of consultants reports, relevant laboratory and other test results, and medications completed. Patient Goals: Set My Target A1C and manage blood sugar to meet target of <6.5 without requiring DM medications  Self-Care Activities - Attends all scheduled provider appointments Adheres to prescribed ADA/carb modified Follow Up Plan: The care management team will reach out to the patient again over the next 30-60 days.       Plan: The care management team will reach out to the patient again over the next 30 days.  Kelli Churn RN, CCM, [facility] RN Care Manager [phone]

## 2021-03-30 NOTE — Patient Instructions (Signed)
Visit Information It was nice speaking with you today.   Goals Addressed               This Visit's Progress     Patient Stated     Blood Pressure < 130/80 (pt-stated)        BP Readings from Last 3 Encounters:  11/25/20 119/83  10/28/20 (!) 135/96  06/19/20 (!) 124/91           Set My Target A1C-Diabetes Type 2 and manage blood sugar to meet target without requiring DM medications (pt-stated)        Timeframe:  Short-Term Goal Priority:  High Start Date:       11/25/20                      Expected End Date:   ongoing                    Follow Up Date 05/02/21   - set target A1C at <6.5%    Why is this important?   Your target A1C is decided together by you and your doctor.  It is based on several things like your age and other health issues.    Notes: Meeting goal; Hgb A1C= 6.3% on 11/25/20 without DM medications, following CHO controlled meal plan       Weight (lb) < 175 lb (79.4 kg) (pt-stated)        Wt Readings from Last 3 Encounters:  11/25/20 189 lb (85.7 kg)  10/28/20 187 lb 8 oz (85 kg)  06/19/20 188 lb (85.3 kg)           Other     LDL CALC < 100        Lab Results  Component Value Date   CHOL 198 11/25/2020   HDL 55 11/25/2020   LDLCALC 129 (H) 11/25/2020   TRIG 76 11/25/2020   CHOLHDL 3.6 11/25/2020    03/30/21- not meeting treatment goal for LDL- ahs received education related to strategies to reduce LD via lifestyle modifications       Make and Keep All Appointments        Timeframe:  Long-Range Goal Priority:  High Start Date:          10/14/20                   Expected End Date:       ongoing                Follow Up Date 05/02/21   - call to cancel if needed - keep a calendar with appointment dates    Why is this important?   Part of staying healthy is seeing the doctor for follow-up care.  If you forget your appointments, there are some things you can do to stay on track.    Notes: Patient  meeting goal       Manage My  Cholesterol        Timeframe:  Long-Range Goal Priority:  Medium Start Date:  06/25/20                           Expected End Date:    ongoing                   Follow Up Date 05/02/21   - change to whole grain breads, cereal, pasta - eat smaller or less  servings of red meat - fill half the plate with nonstarchy vegetables - get blood test (fasting) done 1 week before next visit - increase the amount of fiber in food - read food labels for fat and fiber    Why is this important?   Changing cholesterol starts with eating heart-healthy foods.  Other steps may be to increase your activity and to quit if you smoke.    Notes: patient meeting goal except for LDL,  lipid panel done 11/25/20 showed improvement in total cholesterol and LDL, patient following low Na , fat modified meal plan       Track and Manage My Blood Pressure-Hypertension        Timeframe:  Long-Range Goal Priority:  High Start Date:         06/25/20                    Expected End Date:                       Follow Up Date 05/02/21   - check blood pressure daily - choose a place to take my blood pressure (home, clinic or office, retail store) - write blood pressure results in a log or diary    Why is this important?   You won't feel high blood pressure, but it can still hurt your blood vessels.  High blood pressure can cause heart or kidney problems. It can also cause a stroke.  Making lifestyle changes like losing a little weight or eating less salt will help.  Checking your blood pressure at home and at different times of the day can help to control blood pressure.  If the doctor prescribes medicine remember to take it the way the doctor ordered.  Call the office if you cannot afford the medicine or if there are questions about it.     Notes: Systolic blood pressure meeting goal but diastolic runs 86-38 despite good medication taking behavior and low Na diet         The patient verbalized understanding of  instructions, educational materials, and care plan provided today and declined offer to receive copy of patient instructions, educational materials, and care plan.   The care management team will reach out to the patient again over the next 30 days.   Kelli Churn RN, CCM, [facility] RN Care Manager [phone]

## 2021-04-28 ENCOUNTER — Ambulatory Visit: Payer: Medicare Other

## 2021-04-28 NOTE — Chronic Care Management (AMB) (Signed)
Care Management    RN Visit Note  04/28/2021 Name: William Jones MRN: [id number] DOB: 30-May-1951  Subjective: William Jones is a 70 y.o. year old male who is a primary care patient of Aslam, [name], MD. The care management team was consulted for assistance with disease management and care coordination needs.    Engaged with patient by telephone for follow up visit in response to provider referral for case management and/or care coordination services.   Consent to Services:   [name] was given information about Care Management services today including:  Care Management services includes personalized support from designated clinical staff supervised by his physician, including individualized plan of care and coordination with other care providers 24/7 contact phone numbers for assistance for urgent and routine care needs. The patient may stop case management services at any time by phone call to the office staff.  Patient agreed to services and consent obtained.    Assessment: Patient is making progress with monitoring his blood pressure . See Care Plan below for interventions and patient self-care actives. Follow up Plan: Patient would like continued follow-up.  CCM RNCM will outreach the patient within the next 30 days.  Patient will call office if needed prior to next encounter : Review of patient past medical history, allergies, medications, health status, including review of consultants reports, laboratory and other test data, was performed as part of comprehensive evaluation and provision of chronic care management services.   SDOH (Social Determinants of Health) assessments and interventions performed:    Care Plan  No Known Allergies  Outpatient Encounter Medications as of 04/28/2021  Medication Sig Note   amLODipine (NORVASC) 5 MG tablet Take 1 tablet (5 mg total) by mouth daily.    atorvastatin (LIPITOR) 40 MG tablet Take 1 tablet (40 mg total) by mouth  daily. 09/11/2020: Patient advised he can get refill of Atorvastatin today 09/11/20   BETIMOL 0.5 % ophthalmic solution INT 1 GTT IN OD BID    brimonidine (ALPHAGAN) 0.2 % ophthalmic solution INT 1 GTT INTO THE RIGHT EYE BID    CALCIUM CITRATE PO Take 600 mg by mouth in the morning and at bedtime.    cholecalciferol (VITAMIN D3) 10 MCG (400 UNIT) TABS tablet Take 1,000 Units by mouth daily.    dorzolamide (TRUSOPT) 2 % ophthalmic solution INT 1 GTT IN OD BID    ferrous sulfate 325 (65 FE) MG tablet Take 1 tablet (325 mg total) by mouth daily with breakfast.    hydrochlorothiazide (HYDRODIURIL) 25 MG tablet TAKE 1 TABLET(25 MG) BY MOUTH DAILY    Multiple Vitamins-Minerals (ONE-A-DAY PROACTIVE 65+) TABS Take 1 tablet by mouth daily.    ofloxacin (OCUFLOX) 0.3 % ophthalmic solution     prednisoLONE acetate (PRED FORTE) 1 % ophthalmic suspension     sildenafil (VIAGRA) 100 MG tablet TAKE 1 TABLET BY MOUTH 1 HOURS BEFORE INTERCOURSE (Patient not taking: Reported on 11/25/2020)    timolol (TIMOPTIC) 0.5 % ophthalmic solution PLACE ONE DROP 2 TIMES INTO THE RIGHT EYE UTD    triamcinolone (KENALOG) 0.025 % ointment APPLY EXTERNALLY TO THE AFFECTED AREA TWICE DAILY    vitamin E (VITAMIN E) 180 MG (400 UNITS) capsule Take 400 Units by mouth daily.    XTANDI 40 MG capsule Take 160 mg by mouth daily.    No facility-administered encounter medications on file as of 04/28/2021.    Patient Active Problem List   Diagnosis Date Noted   Fatigue 11/06/2020   First  degree hemorrhoids 10/14/2020   Thrombosed hemorrhoids 10/14/2020   Type 2 diabetes mellitus ([place]) 12/18/2019   Heart murmur 12/18/2019   Hyperlipidemia 11/20/2019   Prostate cancer metastatic to bone ([place]) 12/29/2018   Glaucoma 08/21/2018   Leukopenia 04/20/2017   Rectal mass 09/15/2016   Healthcare maintenance 06/23/2016   Essential hypertension 07/16/2015   Erectile dysfunction 01/23/2014   Iron deficiency anemia 01/08/2014    Conditions  to be addressed/monitored: HTN and DMII  Care Plan : CCM RN Hypertension (Adult)  Updates made by [name], RN since 04/28/2021 12:00 AM     Problem: Hypertension (Hypertension)      Long-Range Goal: Hypertension Monitored and Managed to meet treatment targets of <130/<80   Start Date: 06/25/2020  Expected End Date: 07/03/2021  Recent Progress: Not on track  Priority: High  Note:   see longitudinal plan of care for additional care plan information)  Objective:  Last practice recorded BP readings:  BP Readings from Last 3 Encounters:  11/25/20 119/83  10/28/20 (!) 135/96  06/19/20 (!) 124/91     Most recent eGFR/CrCl: No results found for: EGFR  No components found for: CRCL  Current Barriers:  Knowledge Deficits related to basic understanding of hypertension pathophysiology and self care management- successful outreach to patient via phone to complete follow up assessment, patient reports home monitored BP readings with his last reading from yesterday (04/27/21) being 120/83.  Patient notes he is doing very well.  Case Manager Clinical Goal(s):  Over the next 30-60 days, patient will verbalize understanding of plan for hypertension management Over the next 30-60 days, patient will attend all scheduled medical appointments.  Interventions:  Evaluation of current treatment plan related to hypertension self management and patient's adherence to plan as established by provider.- Patient notes he is taking his medications as ordered and does not forget any doses.  Reviewed lifestyle strategies to lower blood pressure- Patient is following a low sodium diet Reviewed medications with patient and assessed medication taking behavior Positive reinforcement given to patient for implementing strategies to improve his blood pressure and for checking his BP twice daily Reviewed self monitored blood pressure readings and BP target Discussed plans with patient for ongoing care management  follow up and provided patient with direct contact information for care management team Reviewed scheduled/upcoming provider appointments including: none at present, discussed clinic follow up due between June and August. Patient notes he knows he needs to make follow up appt at clinic and he plans to do so soon.  Patient Self Care Activities:  UNABLE to independently manage HTN to meet treatment targets Self administers medications as prescribed Attends all scheduled provider appointments Follows a low sodium diet/DASH diet - check blood pressure at least daily - choose a place to take my blood pressure (home, clinic or office, retail store) - write blood pressure results in a log or diary     Care Plan : CCM RN [place] of Care (Adult)  Updates made by [name], RN since 04/28/2021 12:00 AM     Problem: Health Promotion or Disease Self-Management (General Plan of Care) for HLD      Long-Range Goal: Manage lipids to meet treatment targets   Start Date: 06/25/2020  Expected End Date: 07/03/2021  Recent Progress: On track  Priority: Medium  Note:   CARE PLAN ENTRY (see longitudinal plan of care for additional care plan information)  Current Barriers:  Chronic Disease Management support, education, and care coordination needs related to HLD-  successful outreach to patient to complete follow up telephone assessment,  he says he continues to drink water instead of sweetened sodas and has reduced his consumption of packaged sweets, reports  good medication taking behavior in relation to statin and reports no adverse side effects, continues to deny bone pain from metastatic prostate cancer.  Clinical Goal(s) related to  HLD:  Over the next 30-60 days, patient will:  Work with the care management team to address educational, disease management, and care coordination needs  Continue self health monitoring activities as directed today reduce the amount of sweetened soda  consumption from 1/2 liter per day  to none  Call provider office for new problems and maintain schedule of regular follow up appointments  Call care management team with questions or concerns Verbalize basic understanding of patient centered plan of care established today  Interventions related to HLD:  Evaluation of current treatment plans and patient's adherence to plan as established by provider Again reviewed strategies to get LDL to treatment target of <100 by reducing saturated fat consumption, increasing heart healthy fat consumption such as salmon, tuna, walnuts, almonds and change to peanut, canola or olive oil instead of vegetable oil or lard, and increase consumption of dietary fiber Assessed statin tolerance and assessed statin medication taking behavior and suggested he take statin in the evening to improve efficacy Assessed patient's education and care coordination needs Discussed plans with patient for ongoing care management follow up and ensured patient has direct contact information for care management team  Patient Self Care Activities related to HLD:  Patient is unable to independently self-manage chronic health conditions - change to whole grain breads, cereal, pasta - eat smaller or less servings of red meat - fill half the plate with nonstarchy vegetables - increase the amount of fiber in food - read food labels for fat and fiber     Care Plan : CCM RN- Diabetes Type 2 (Adult)  Updates made by [name], RN since 04/28/2021 12:00 AM     Problem: Glycemic Management (Diabetes, Type 2)      Long-Range Goal: Glycemic Management Optimized without requiring DM medication per patient goal   Start Date: 11/25/2020  Recent Progress: On track  Priority: High  Note:   Objective:  Lab Results  Component Value Date   HGBA1C 6.3 (A) 11/25/2020   Lab Results  Component Value Date   CREATININE 0.90 12/18/2019   CREATININE 0.70 (L) 11/06/2019   CREATININE 0.78  04/10/2019   No results found for: EGFR Current Barriers:  Knowledge Deficits related to basic Diabetes pathophysiology and self care/management- successful telephone outreach to patient to complete follow up assessment, states he continues to follow CHO controlled meal plan and 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 Unable to independently self educate regarding diabetes self management  Case Manager Clinical Goal(s):  patient will demonstrate improved adherence to prescribed treatment plan for diabetes self care/management as evidenced by: adherence to ADA/ carb modified diet contacting provider for new or worsened symptoms or questions, achieve good glycemic control without need for DM medication Interventions:  Collaboration with Harvie Heck, MD regarding development and update of comprehensive plan of care as evidenced by provider attestation and co-signature Inter-disciplinary care team collaboration (see longitudinal plan of care) Allowed time for questions r/t diabetes and controlling diabetes without the use of medications  Review of patient status, including review  of consultants reports, relevant laboratory and other test results, and medications completed. Patient Goals: Set My Target A1C and manage blood sugar to meet target of <6.5 without requiring DM medications  Self-Care Activities - Attends all scheduled provider appointments Adheres to prescribed ADA/carb modified Follow Up Plan: The care management team will reach out to the patient again over the next 30-60 days.       Plan: Telephone follow up appointment with care management team member scheduled for:  30 days.  [name], RN, BSN, CCM Care Management Coordinator [facility] Internal Medicine Phone: [phone] / Fax: [phone]

## 2021-04-28 NOTE — Patient Instructions (Signed)
Visit Information   Goals Addressed               This Visit's Progress     Blood Pressure < 130/80 (pt-stated)        BP Readings from Last 3 Encounters:  11/25/20 119/83  10/28/20 (!) 135/96  06/19/20 (!) 124/91     Note: Patient home blood pressure results from 04/27/21- 120/83      DIET - EAT MORE FRUITS AND VEGETABLES (pt-stated)        LDL CALC < 100        Lab Results  Component Value Date   CHOL 198 11/25/2020   HDL 55 11/25/2020   LDLCALC 129 (H) 11/25/2020   TRIG 76 11/25/2020   CHOLHDL 3.6 11/25/2020    03/30/21- not meeting treatment goal for LDL      Make and Keep All Appointments        Timeframe:  Long-Range Goal Priority:  High Start Date:          10/14/20                   Expected End Date:       ongoing                Follow Up Date 07/03/21   - call to cancel if needed - keep a calendar with appointment dates    Why is this important?   Part of staying healthy is seeing the doctor for follow-up care.  If you forget your appointments, there are some things you can do to stay on track.    Notes: Patient  meeting goal      Manage My Cholesterol        Timeframe:  Long-Range Goal Priority:  Medium Start Date:  06/25/20                           Expected End Date:    ongoing                   Follow Up Date 07/03/21   - change to whole grain breads, cereal, pasta - eat smaller or less servings of red meat - fill half the plate with nonstarchy vegetables - get blood test (fasting) done 1 week before next visit - increase the amount of fiber in food - read food labels for fat and fiber    Why is this important?   Changing cholesterol starts with eating heart-healthy foods.  Other steps may be to increase your activity and to quit if you smoke.    Notes: patient meeting goal except for LDL,  lipid panel done 11/25/20 showed improvement in total cholesterol and LDL, patient following low Na , fat modified meal plan      Set My Target  A1C-Diabetes Type 2 and manage blood sugar to meet target without requiring DM medications (pt-stated)        Timeframe:  Short-Term Goal Priority:  High Start Date:       11/25/20                      Expected End Date:   ongoing                    Follow Up Date 05/02/21   - set target A1C at <6.5%    Why is this important?   Your target A1C is  decided together by you and your doctor.  It is based on several things like your age and other health issues.    Notes:       Track and Manage My Blood Pressure-Hypertension        Timeframe:  Long-Range Goal Priority:  High Start Date:         06/25/20                    Expected End Date:                       Follow Up Date 07/03/21   - check blood pressure daily - choose a place to take my blood pressure (home, clinic or office, retail store) - write blood pressure results in a log or diary    Why is this important?   You won't feel high blood pressure, but it can still hurt your blood vessels.  High blood pressure can cause heart or kidney problems. It can also cause a stroke.  Making lifestyle changes like losing a little weight or eating less salt will help.  Checking your blood pressure at home and at different times of the day can help to control blood pressure.  If the doctor prescribes medicine remember to take it the way the doctor ordered.  Call the office if you cannot afford the medicine or if there are questions about it.     Notes:       Weight (lb) < 175 lb (79.4 kg) (pt-stated)        Wt Readings from Last 3 Encounters:  11/25/20 189 lb (85.7 kg)  10/28/20 187 lb 8 oz (85 kg)  06/19/20 188 lb (85.3 kg)     Notes:        The patient verbalized understanding of instructions, educational materials, and care plan provided today and declined offer to receive copy of patient instructions, educational materials, and care plan.   Telephone follow up appointment with care management team member scheduled  for:06/02/2021'@10'$ :[place], RN, BSN, CCM Care Management Coordinator [facility] Internal Medicine Phone: [phone] / Fax: [phone]

## 2021-05-01 ENCOUNTER — Emergency Department (HOSPITAL_COMMUNITY): Payer: Medicare Other

## 2021-05-01 ENCOUNTER — Encounter (HOSPITAL_COMMUNITY): Payer: Self-pay

## 2021-05-01 ENCOUNTER — Emergency Department (HOSPITAL_COMMUNITY)
Admission: EM | Admit: 2021-05-01 | Discharge: 2021-05-01 | Disposition: A | Discharge status: Home / Self Care | Payer: Medicare Other | Attending: Emergency Medicine | Admitting: Emergency Medicine

## 2021-05-01 ENCOUNTER — Other Ambulatory Visit: Payer: Self-pay

## 2021-05-01 DIAGNOSIS — Y92410 Unspecified street and highway as the place of occurrence of the external cause: Secondary | ICD-10-CM | POA: Insufficient documentation

## 2021-05-01 DIAGNOSIS — Z85830 Personal history of malignant neoplasm of bone: Secondary | ICD-10-CM | POA: Insufficient documentation

## 2021-05-01 DIAGNOSIS — I10 Essential (primary) hypertension: Secondary | ICD-10-CM | POA: Diagnosis not present

## 2021-05-01 DIAGNOSIS — Z8546 Personal history of malignant neoplasm of prostate: Secondary | ICD-10-CM | POA: Insufficient documentation

## 2021-05-01 DIAGNOSIS — S161XXA Strain of muscle, fascia and tendon at neck level, initial encounter: Secondary | ICD-10-CM

## 2021-05-01 DIAGNOSIS — Z87891 Personal history of nicotine dependence: Secondary | ICD-10-CM | POA: Insufficient documentation

## 2021-05-01 DIAGNOSIS — Z79899 Other long term (current) drug therapy: Secondary | ICD-10-CM | POA: Insufficient documentation

## 2021-05-01 DIAGNOSIS — E1140 Type 2 diabetes mellitus with diabetic neuropathy, unspecified: Secondary | ICD-10-CM | POA: Diagnosis not present

## 2021-05-01 DIAGNOSIS — S199XXA Unspecified injury of neck, initial encounter: Secondary | ICD-10-CM | POA: Diagnosis present

## 2021-05-01 MED ORDER — ACETAMINOPHEN 500 MG PO TABS
1000.0000 mg | ORAL_TABLET | Freq: Once | ORAL | Status: AC
  Administered 2021-05-01: 1000 mg via ORAL
  Filled 2021-05-01: qty 2

## 2021-05-01 MED ORDER — CYCLOBENZAPRINE HCL 10 MG PO TABS: 10.0000 mg | ORAL_TABLET | Freq: Two times a day (BID) | ORAL | 0 refills | Status: DC | PRN

## 2021-05-01 MED ORDER — ACETAMINOPHEN 500 MG PO TABS: 1000.0000 mg | ORAL_TABLET | Freq: Four times a day (QID) | ORAL | 0 refills | Status: DC | PRN
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
## 2021-05-05 ENCOUNTER — Other Ambulatory Visit: Payer: Self-pay | Admitting: Urology

## 2021-05-05 DIAGNOSIS — C7951 Secondary malignant neoplasm of bone: Secondary | ICD-10-CM

## 2021-05-05 DIAGNOSIS — C61 Malignant neoplasm of prostate: Secondary | ICD-10-CM

## 2021-05-07 ENCOUNTER — Other Ambulatory Visit: Payer: Self-pay | Admitting: *Deleted

## 2021-05-08 MED ORDER — HYDROCHLOROTHIAZIDE 25 MG PO TABS: ORAL_TABLET | ORAL | 1 refills | Status: DC

## 2021-05-14 ENCOUNTER — Ambulatory Visit (INDEPENDENT_AMBULATORY_CARE_PROVIDER_SITE_OTHER): Payer: Medicare Other | Admitting: Surgery

## 2021-05-14 ENCOUNTER — Other Ambulatory Visit: Payer: Self-pay

## 2021-05-14 ENCOUNTER — Encounter: Payer: Self-pay | Admitting: Surgery

## 2021-05-14 VITALS — BP 137/88 | HR 77 | Ht 69.0 in | Wt 184.4 lb

## 2021-05-14 DIAGNOSIS — M47812 Spondylosis without myelopathy or radiculopathy, cervical region: Secondary | ICD-10-CM | POA: Diagnosis not present

## 2021-05-14 DIAGNOSIS — S161XXA Strain of muscle, fascia and tendon at neck level, initial encounter: Secondary | ICD-10-CM
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
## 2021-05-26 ENCOUNTER — Ambulatory Visit (INDEPENDENT_AMBULATORY_CARE_PROVIDER_SITE_OTHER): Payer: Medicare Other | Admitting: Orthopaedic Surgery

## 2021-05-26 ENCOUNTER — Other Ambulatory Visit: Payer: Self-pay

## 2021-05-26 ENCOUNTER — Encounter: Payer: Self-pay | Admitting: Orthopaedic Surgery

## 2021-05-26 ENCOUNTER — Ambulatory Visit: Payer: Medicare Other | Admitting: Orthopaedic Surgery

## 2021-05-26 DIAGNOSIS — S161XXD Strain of muscle, fascia and tendon at neck level, subsequent encounter: Secondary | ICD-10-CM
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
## 2021-05-27 ENCOUNTER — Other Ambulatory Visit: Payer: Self-pay | Admitting: Student

## 2021-05-27 ENCOUNTER — Ambulatory Visit: Payer: Medicare Other | Admitting: Orthopaedic Surgery

## 2021-05-27 DIAGNOSIS — S161XXA Strain of muscle, fascia and tendon at neck level, initial encounter: Secondary | ICD-10-CM | POA: Insufficient documentation

## 2021-06-02 ENCOUNTER — Ambulatory Visit: Payer: Medicare Other

## 2021-06-02 NOTE — Patient Instructions (Signed)
Visit Information   Goals Addressed               This Visit's Progress     Blood Pressure < 130/80 (pt-stated)        BP Readings from Last 3 Encounters:  05/26/21 (!) 154/91  05/14/21 137/88  05/01/21 (!) 152/105     Note: Patient home blood pressure check results from 04/27/21- 120/83      LDL CALC < 100        Lab Results  Component Value Date   CHOL 198 11/25/2020   HDL 55 11/25/2020   LDLCALC 129 (H) 11/25/2020   TRIG 76 11/25/2020   CHOLHDL 3.6 11/25/2020    03/30/21- not meeting treatment goal for LDL      Weight (lb) < 175 lb (79.4 kg) (pt-stated)        Wt Readings from Last 3 Encounters:  05/26/21 180 lb (81.6 kg)  05/14/21 184 lb 6.4 oz (83.6 kg)  11/25/20 189 lb (85.7 kg)     Notes:        The patient verbalized understanding of instructions, educational materials, and care plan provided today and declined offer to receive copy of patient instructions, educational materials, and care plan.   Telephone follow up appointment with care management team member scheduled for: 06/30/21'@10'$ :G[place], RN, BSN, CCM Care Management Coordinator [facility] Phone: [phone] / Fax: [phone]
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
## 2021-06-05 ENCOUNTER — Other Ambulatory Visit: Payer: Self-pay | Admitting: *Deleted

## 2021-06-05 DIAGNOSIS — E782 Mixed hyperlipidemia: Secondary | ICD-10-CM

## 2021-06-06 MED ORDER — ATORVASTATIN CALCIUM 40 MG PO TABS: 40.0000 mg | ORAL_TABLET | Freq: Every day | ORAL | 11 refills | Status: DC

## 2021-06-09 ENCOUNTER — Other Ambulatory Visit: Payer: Self-pay

## 2021-06-09 ENCOUNTER — Encounter (HOSPITAL_COMMUNITY)
Admission: RE | Admit: 2021-06-09 | Discharge: 2021-06-09 | Disposition: A | Discharge status: Home / Self Care | Payer: Medicare Other | Source: Ambulatory Visit | Attending: Urology | Admitting: Urology

## 2021-06-09 DIAGNOSIS — C61 Malignant neoplasm of prostate: Secondary | ICD-10-CM | POA: Diagnosis not present

## 2021-06-09 DIAGNOSIS — C7951 Secondary malignant neoplasm of bone: Secondary | ICD-10-CM | POA: Diagnosis present

## 2021-06-09 MED ORDER — TECHNETIUM TC 99M MEDRONATE IV KIT
21.0000 | PACK | Freq: Once | INTRAVENOUS | Status: AC
  Administered 2021-06-09: 21 via INTRAVENOUS

## 2021-06-30 ENCOUNTER — Telehealth: Payer: Medicare Other

## 2021-06-30 ENCOUNTER — Ambulatory Visit: Payer: Medicare Other | Admitting: Orthopaedic Surgery

## 2021-06-30 ENCOUNTER — Telehealth: Payer: Self-pay

## 2021-06-30 NOTE — Telephone Encounter (Signed)
  Care Management   Outreach Note  06/30/2021 Name: DAEVEON ZWEBER MRN: [id number] DOB: [birth year]  Referred by: Harvie Heck, MD Reason for referral : No chief complaint on file.   An unsuccessful telephone outreach was attempted today. The patient was referred to the case management team for assistance with care management and care coordination.   Follow Up Plan: If patient returns call to provider office, please advise to call [place] at [phone].  [name], RN, BSN, CCM Care Management Coordinator [facility] Internal [facility] Phone: [phone] / Fax: [phone]

## 2021-07-03 ENCOUNTER — Ambulatory Visit (INDEPENDENT_AMBULATORY_CARE_PROVIDER_SITE_OTHER): Payer: Medicare Other | Admitting: *Deleted

## 2021-07-03 DIAGNOSIS — Z23 Encounter for immunization: Secondary | ICD-10-CM | POA: Diagnosis not present

## 2021-07-06 ENCOUNTER — Ambulatory Visit: Payer: Medicare Other
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
## 2021-07-06 NOTE — Patient Instructions (Signed)
Visit Information   Goals Addressed               This Visit's Progress     Blood Pressure < 130/80 (pt-stated)        BP Readings from Last 3 Encounters:  05/26/21 (!) 154/91  05/14/21 137/88  05/01/21 (!) 152/105     Note: Patient home blood pressure check results from 07/06/21-125/80/.      LDL CALC < 100        Lab Results  Component Value Date   CHOL 198 11/25/2020   HDL 55 11/25/2020   LDLCALC 129 (H) 11/25/2020   TRIG 76 11/25/2020   CHOLHDL 3.6 11/25/2020        Make and Keep All Appointments        Timeframe:  Long-Range Goal Priority:  High Start Date:          10/14/20                   Expected End Date:       ongoing                Follow Up Date 08/02/21   - call to cancel if needed - keep a calendar with appointment dates    Why is this important?   Part of staying healthy is seeing the doctor for follow-up care.  If you forget your appointments, there are some things you can do to stay on track.    Notes: Patient  meeting goal      Manage My Cholesterol        Timeframe:  Long-Range Goal Priority:  Medium Start Date:  06/25/20                           Expected End Date:    ongoing                   Follow Up Date 08/02/21   - change to whole grain breads, cereal, pasta - eat smaller or less servings of red meat - fill half the plate with nonstarchy vegetables - get blood test (fasting) done 1 week before next visit - increase the amount of fiber in food - read food labels for fat and fiber    Why is this important?   Changing cholesterol starts with eating heart-healthy foods.  Other steps may be to increase your activity and to quit if you smoke.    Notes:      Set My Target A1C-Diabetes Type 2 and manage blood sugar to meet target without requiring DM medications (pt-stated)        Timeframe:  Short-Term Goal Priority:  High Start Date:       11/25/20                      Expected End Date:   ongoing                    Follow Up  Date 08/02/21   - set target A1C at <6.5%    Why is this important?   Your target A1C is decided together by you and your doctor.  It is based on several things like your age and other health issues.    Notes:       Track and Manage My Blood Pressure-Hypertension        Timeframe:  Long-Range Goal Priority:  High Start Date:         06/25/20                    Expected End Date:                       Follow Up Date 08/02/21   - check blood pressure daily - choose a place to take my blood pressure (home, clinic or office, retail store) - write blood pressure results in a log or diary    Why is this important?   You won't feel high blood pressure, but it can still hurt your blood vessels.  High blood pressure can cause heart or kidney problems. It can also cause a stroke.  Making lifestyle changes like losing a little weight or eating less salt will help.  Checking your blood pressure at home and at different times of the day can help to control blood pressure.  If the doctor prescribes medicine remember to take it the way the doctor ordered.  Call the office if you cannot afford the medicine or if there are questions about it.     Notes:         The patient verbalized understanding of instructions, educational materials, and care plan provided today and declined offer to receive copy of patient instructions, educational materials, and care plan.   Telephone follow up appointment with care management team member scheduled for: 08/10/21@0930   [name], RN, BSN, CCM Care Management Coordinator [facility] Phone: [phone] / Fax: [phone]

## 2021-07-07 ENCOUNTER — Encounter: Payer: Self-pay | Admitting: Internal Medicine

## 2021-07-07 ENCOUNTER — Ambulatory Visit (INDEPENDENT_AMBULATORY_CARE_PROVIDER_SITE_OTHER): Payer: Medicare Other | Admitting: Internal Medicine

## 2021-07-07 VITALS — BP 149/93 | HR 66 | Temp 98.4°F | Wt 184.1 lb

## 2021-07-07 DIAGNOSIS — R21 Rash and other nonspecific skin eruption: Secondary | ICD-10-CM

## 2021-07-07 DIAGNOSIS — C61 Malignant neoplasm of prostate: Secondary | ICD-10-CM | POA: Diagnosis not present

## 2021-07-07 DIAGNOSIS — E782 Mixed hyperlipidemia: Secondary | ICD-10-CM | POA: Diagnosis not present

## 2021-07-07 DIAGNOSIS — E119 Type 2 diabetes mellitus without complications: Secondary | ICD-10-CM | POA: Diagnosis not present

## 2021-07-07 DIAGNOSIS — C7951 Secondary malignant neoplasm of bone: Secondary | ICD-10-CM

## 2021-07-07 DIAGNOSIS — I10 Essential (primary) hypertension: Secondary | ICD-10-CM

## 2021-07-07 LAB — GLUCOSE, CAPILLARY: Glucose-Capillary: 101 mg/dL — ABNORMAL HIGH (ref 70–99)

## 2021-07-07 LAB — POCT GLYCOSYLATED HEMOGLOBIN (HGB A1C): Hemoglobin A1C: 6.4 % — AB (ref 4.0–5.6)

## 2021-07-07 MED ORDER — AMLODIPINE BESYLATE 10 MG PO TABS: 10.0000 mg | ORAL_TABLET | Freq: Every day | ORAL | 11 refills | Status: DC

## 2021-07-07 NOTE — Patient Instructions (Addendum)
Mr William Jones,  It was a pleasure seeing you in clinic. Today we discussed:   Blood pressure: At this time, please stop taking the amlodipine 5mg  and 2.5mg . Please start taking amlodipine 10mg  once daily. Please continue to take hydrochlorothiazide 25mg  daily. Continue checking your blood pressures at home. Continue low-salt diet.   Diabetes: Your A1c is 6.4 today. Continue with diet control at this time.   Back rash: You may try to change the detergents and soaps used. Please let us know if no significant improvement in symptoms.   If you have any questions or concerns, please call our clinic at [phone] between 9am-5pm and after hours call [phone] and ask for the internal medicine resident on call. If you feel you are having a medical emergency please call 911.   Thank you, we look forward to helping you remain healthy!

## 2021-07-07 NOTE — Assessment & Plan Note (Addendum)
BP Readings from Last 3 Encounters:  07/07/21 (!) 149/93  05/26/21 (!) 154/91  05/14/21 137/88   Patient has a history of hypertension for which he has been on amlodipine 5mg  daily, amlodipine 2.5mg  daily, and HCTZ 25mg  daily. He reports medication adherence. He checks his BP at home and reports systolic BP's in 025-427'C. He denies any headaches, lightheadedness/dizziness, chest pain or focal weakness.   Plan: Change to amlodipine 10mg  daily Continue HCTZ 25mg  daily Continue DASH diet F/u in 3 months

## 2021-07-07 NOTE — Assessment & Plan Note (Signed)
ASCVD risk 38%  - Continue atorvastatin 40mg  daily. - Repeat lipid panel in February 2023

## 2021-07-07 NOTE — Assessment & Plan Note (Signed)
Patient reports back rash for approximately 1-2 months. He notes that he has constant itching that is slightly relieved after he takes a shower. On exam, no obvious rash or lesions appreciated.  He does note recent change in soap and detergent. Patient advised for nonscented hypoallergenic products. Also advised that he may temporarily use over-the-counter hydrocortisone 1% cream for symptomatic relief.

## 2021-07-07 NOTE — Assessment & Plan Note (Signed)
HbA1c 6.4 at this visit. This has been diet controlled.   Plan: Urine microalbumin/cr ratio Repeat in 3-6 months

## 2021-07-07 NOTE — Progress Notes (Signed)
   CC: hypertension f/u  HPI:  Mr.Jaxyn F Pinkney is a 70 y.o. male with PMHx as stated below presenting for hypertension follow up. He also notes a rash on his back for the past 1-2 months. Denies any other acute concerns. Please see problem based charting for complete assessment and plan.   Past Medical History:  Diagnosis Date   Angiodysplasia of colon with hemorrhage 10/14/2020   Duodenitis    Essential hypertension 07/16/2015   Glaucoma    H/O: substance abuse ([place])    Hemorrhage of rectum and anus 10/14/2020   History of frostbite    History of prostate cancer 03/20/2014   Lost to f/u with [facility] in the past 2011 with elevated PSA >30 at that time.  Saw [facility] (Dr. Risa Grill) on 03/13/14. Cancer Noted 03/13/14 office visit. Gleason score 7. Plan CT Ab/pelvis with contrast, bone scan    History of radiation therapy nov 2015 to feb 2016   Hyperlipidemia 11/20/2019   Iron deficiency anemia    Malignant tumor of prostate ([place]) 10/14/2020   Peripheral neuropathy    Prostate cancer ([place]) 03/05/14   Gleason 7, volume 45 gm   Rash and nonspecific skin eruption 08/04/2017   S/P radiation therapy 09/18/14-11/15/14   prostate/ 7800Gy/40sessions   Thrombocytopenia ([place])    Review of Systems:  Negative except as stated in HPI.  Physical Exam:  Vitals:   07/07/21 0905  BP: (!) 149/93  Pulse: 66  Temp: 98.4 F (36.9 C)  TempSrc: Oral  SpO2: 99%  Weight: 184 lb 1.6 oz (83.5 kg)   Physical Exam  Constitutional: Appears well-developed and well-nourished. No distress.  HENT: Normocephalic and atraumatic, EOMI, conjunctiva normal, moist mucous membranes Cardiovascular: Normal rate, regular rhythm, S1 and S2 present, no murmurs, rubs, gallops.  Distal pulses intact Respiratory: No respiratory distress, no accessory muscle use.  Effort is normal.  Lungs are clear to auscultation bilaterally. GI: Nondistended, soft, nontender to palpation, normal active bowel  sounds Musculoskeletal: Normal bulk and tone.  No peripheral edema noted. Neurological: Is alert and oriented x4, no apparent focal deficits noted. Skin: Warm and dry.  No rash, erythema, lesions noted. Psychiatric: Normal mood and affect. Behavior is normal. Judgment and thought content normal.    Assessment & Plan:   See Encounters Tab for problem based charting.  Patient discussed with Dr. Daryll Drown

## 2021-07-07 NOTE — Assessment & Plan Note (Signed)
Patient continues to follow with Dr Tammi Klippel for this. He is continued on Xtandi 160mg  daily. Notes follow up appointment is in 2 weeks.

## 2021-07-08 LAB — MICROALBUMIN / CREATININE URINE RATIO
Creatinine, Urine: 48.4 mg/dL
Microalb/Creat Ratio: 23 mg/g creat (ref 0–29)
Microalbumin, Urine: 11.2 ug/mL

## 2021-07-16 NOTE — Progress Notes (Signed)
Internal Medicine Clinic Attending ° °Case discussed with Dr. Aslam  At the time of the visit.  We reviewed the resident’s history and exam and pertinent patient test results.  I agree with the assessment, diagnosis, and plan of care documented in the resident’s note.  °

## 2021-07-17 ENCOUNTER — Encounter: Payer: Self-pay | Admitting: *Deleted

## 2021-07-17 NOTE — Progress Notes (Unsigned)

## 2021-07-20 ENCOUNTER — Telehealth: Payer: Self-pay | Admitting: Internal Medicine

## 2021-07-20 NOTE — Telephone Encounter (Signed)
Things That May Be Affecting Your Health:  Alcohol  Hearing loss  Pain   Y Depression  Home Safety  Sexual Health   Diabetes  Lack of physical activity  Stress   Difficulty with daily activities  Loneliness Y Tiredness   Drug use  Medicines  Tobacco use   Falls  Motor Vehicle Safety  Weight  Y Food choices  Oral Health  Other    YOUR PERSONALIZED HEALTH PLAN : 1. Schedule your next subsequent Medicare Wellness visit in one year 2. Attend all of your regular appointments to address your medical issues 3. Complete the preventative screenings and services   Annual Wellness Visit   Medicare Covered Preventative Screenings and [place] Men and Women Who How Often Need? Date of Last Service Action  Abdominal Aortic Aneurysm Adults with AAA risk factors Once  @HMIMMADMINDATE ([phone])@    Alcohol Misuse and Counseling All Adults Screening once a year if no alcohol misuse. Counseling up to 4 face to face sessions.     Bone Density Measurement  Adults at risk for osteoporosis Once every 2 yrs  @HMIMMADMINDATE ([id number])@    Lipid Panel Z13.6 All adults without CV disease Once every 5 yrs  @HMIMMADMINDATE ([id number])@     Colorectal Cancer  Stool sample or Colonoscopy All adults 7 and older  Once every year Every 10 years  @HMIMMADMINDATE ([id number])@ @HMIMMADMINDATE ([id number])@ @HMIMMADMINDATE ([id number])@    Depression All Adults Once a year Y Today   Diabetes Screening Blood glucose, post glucose load, or GTT Z13.1 All adults at risk Pre-diabetics Once per year Twice per year  @HMIMMADMINDATE ([id number])@    Diabetes  Self-Management Training All adults Diabetics 10 hrs first year; 2 hours subsequent years. Requires Copay Y    Glaucoma Diabetics Family history of glaucoma African Americans 34 yrs + Hispanic Americans 3 yrs + Annually - requires coppay  @HMIMMADMINDATE ([phone])@    Hepatitis C Z72.89 or F19.20 High Risk for HCV Born between  1945 and 1965 Annually Once      HIV Z11.4 All adults based on risk Annually btw ages 60 & 75 regardless of risk Annually > 65 yrs if at increased risk      Lung Cancer Screening Asymptomatic adults aged 42-77 with 30 pack yr history and current smoker OR quit within the last 15 yrs Annually Must have counseling and shared decision making documentation before first screen      Medical Nutrition Therapy Adults with  Diabetes Renal disease Kidney transplant within past 3 yrs 3 hours first year; 2 hours subsequent years Y    Obesity and Counseling All adults Screening once a year Counseling if BMI 30 or higher  Today   Tobacco Use Counseling Adults who use tobacco  Up to 8 visits in one year     Vaccines Z23 Hepatitis B Influenza  Pneumonia  Adults  Once Once every flu season Two different vaccines separated by one year     Next Annual Wellness Visit People with Medicare Every year  Today     Services & Screenings Women Who How Often Need  Date of Last Service Action  Mammogram  Z12.31 Women over 85 One baseline ages 20-39. Annually ager 40 yrs+      Pap tests All women Annually if high risk. Every 2 yrs for normal risk women      Screening for cervical cancer with  Pap (Z01.419 nl or Z01.411abnl) & HPV Z11.51 Women aged 90 to 53 Once every 5 yrs  Screening pelvic and breast exams All women Annually if high risk. Every 2 yrs for normal risk women     Sexually Transmitted Diseases Chlamydia Gonorrhea Syphilis All at risk adults Annually for non pregnant females at increased risk         [place] Men Who How Ofter Need  Date of Last Service Action  Prostate Cancer - DRE & PSA Men over 50 Annually.  DRE might require a copay.        Sexually Transmitted Diseases Syphilis All at risk adults Annually for men at increased risk      Health Maintenance List Health Maintenance  Topic Date Due   COVID-19 Vaccine (3 - Pfizer risk series) 02/06/2020    OPHTHALMOLOGY EXAM  09/17/2021   HEMOGLOBIN A1C  01/05/2022   COLONOSCOPY (Pts 45-65yrs Insurance coverage will need to be confirmed)  01/11/2024   TETANUS/TDAP  05/18/2027   INFLUENZA VACCINE  Completed   Hepatitis C Screening  Completed   Zoster Vaccines- Shingrix  Completed   HPV VACCINES  Aged Out   FOOT EXAM  Discontinued

## 2021-08-10 ENCOUNTER — Ambulatory Visit: Payer: Medicare Other

## 2021-08-10 DIAGNOSIS — E119 Type 2 diabetes mellitus without complications: Secondary | ICD-10-CM

## 2021-08-10 DIAGNOSIS — I10 Essential (primary) hypertension: Secondary | ICD-10-CM

## 2021-08-10 NOTE — Patient Instructions (Signed)
Visit Information  The patient verbalized understanding of instructions, educational materials, and care plan provided today and declined offer to receive copy of patient instructions, educational materials, and care plan.   Telephone follow up appointment with care management team member scheduled for: 09/14/21@0930 .  [name], RN, BSN, CCM Care Management Coordinator [facility] Phone: [phone]: [phone]

## 2021-08-10 NOTE — Chronic Care Management (AMB) (Signed)
Care Management    RN Visit Note  08/10/2021 Name: William Jones MRN: [id number] DOB: 08/20/1951  Subjective: William Jones is a 70 y.o. year old male who is a primary care patient of Aslam, [name], MD. The care management team was consulted for assistance with disease management and care coordination needs.    Engaged with patient by telephone for follow up visit in response to provider referral for case management and/or care coordination services.   Consent to Services:   William Jones was given information about Care Management services today including:  Care Management services includes personalized support from designated clinical staff supervised by his physician, including individualized plan of care and coordination with other care providers 24/7 contact phone numbers for assistance for urgent and routine care needs. The patient may stop case management services at any time by phone call to the office staff.  Patient agreed to services and consent obtained.    Assessment: No clinical needs Identified. See Care Plan below for interventions and patient self-care actives. Follow up Plan: Patient would like continued follow-up.  CCM RNCM will outreach the patient within the next 30 days.  Patient will call office if needed prior to next encounter : Review of patient past medical history, allergies, medications, health status, including review of consultants reports, laboratory and other test data, was performed as part of comprehensive evaluation and provision of chronic care management services.   SDOH (Social Determinants of Health) assessments and interventions performed:    Care Plan  No Known Allergies  Outpatient Encounter Medications as of 08/10/2021  Medication Sig   acetaminophen (TYLENOL) 500 MG tablet Take 2 tablets (1,000 mg total) by mouth every 6 (six) hours as needed for mild pain or moderate pain.   amLODipine (NORVASC) 10 MG tablet Take 1 tablet  (10 mg total) by mouth daily.   atorvastatin (LIPITOR) 40 MG tablet Take 1 tablet (40 mg total) by mouth daily.   BETIMOL 0.5 % ophthalmic solution INT 1 GTT IN OD BID   brimonidine (ALPHAGAN) 0.2 % ophthalmic solution INT 1 GTT INTO THE RIGHT EYE BID   CALCIUM CITRATE PO Take 600 mg by mouth in the morning and at bedtime.   cholecalciferol (VITAMIN D3) 10 MCG (400 UNIT) TABS tablet Take 1,000 Units by mouth daily.   cyclobenzaprine (FLEXERIL) 10 MG tablet Take 1 tablet (10 mg total) by mouth 2 (two) times daily as needed for muscle spasms.   dorzolamide (TRUSOPT) 2 % ophthalmic solution INT 1 GTT IN OD BID   ferrous sulfate 325 (65 FE) MG tablet Take 1 tablet (325 mg total) by mouth daily with breakfast.   hydrochlorothiazide (HYDRODIURIL) 25 MG tablet TAKE 1 TABLET(25 MG) BY MOUTH DAILY   Multiple Vitamins-Minerals (ONE-A-DAY PROACTIVE 65+) TABS Take 1 tablet by mouth daily.   ofloxacin (OCUFLOX) 0.3 % ophthalmic solution    prednisoLONE acetate (PRED FORTE) 1 % ophthalmic suspension    sildenafil (VIAGRA) 100 MG tablet    timolol (TIMOPTIC) 0.5 % ophthalmic solution PLACE ONE DROP 2 TIMES INTO THE RIGHT EYE UTD   triamcinolone (KENALOG) 0.025 % ointment APPLY EXTERNALLY TO THE AFFECTED AREA TWICE DAILY   vitamin E 180 MG (400 UNITS) capsule Take 400 Units by mouth daily.   XTANDI 40 MG capsule Take 160 mg by mouth daily.   No facility-administered encounter medications on file as of 08/10/2021.    Patient Active Problem List   Diagnosis Date Noted   Rash 07/07/2021  Cervical strain 05/27/2021   Fatigue 11/06/2020   First degree hemorrhoids 10/14/2020   Thrombosed hemorrhoids 10/14/2020   Type 2 diabetes mellitus ([place]) 12/18/2019   Hyperlipidemia 11/20/2019   Prostate cancer metastatic to bone ([place]) 12/29/2018   Glaucoma 08/21/2018   Leukopenia 04/20/2017   Rectal mass 09/15/2016   Healthcare maintenance 06/23/2016   Essential hypertension 07/16/2015   Erectile dysfunction  01/23/2014   Iron deficiency anemia 01/08/2014    Conditions to be addressed/monitored: HTN and DMII  Care Plan : CCM RN Hypertension (Adult)  Updates made by [name], RN since 08/10/2021 12:00 AM     Problem: Hypertension (Hypertension)      Long-Range Goal: Hypertension Monitored and Managed to meet treatment targets of <130/<80   Start Date: 06/25/2020  Expected End Date: 07/03/2021  Recent Progress: Not on track  Priority: High  Note:   see longitudinal plan of care for additional care plan information)  Objective:  Last practice recorded BP readings:  BP Readings from Last 3 Encounters:  05/26/21 (!) 154/91  05/14/21 137/88  05/01/21 (!) 152/105     Most recent eGFR/CrCl: No results found for: EGFR  No components found for: CRCL  Current Barriers:  Knowledge Deficits related to basic understanding of hypertension pathophysiology and self care management- Successful outreach to patient via phone to complete follow up assessment, patient reports home monitored BP readings with his last reading from this morning being 130/80.  We discussed his reading when he was at the clinic, which was 154/91.  Patient noted that he no longer has a car so he takes the bus everywhere and he had a long walk to get to the clinic.  He says normally the nurse takes his blood pressure a second time when it was high and they did not do that at that visit.  He feels his home readings are good.  Patient also notes that he needs a refill of the Triamcinolone Cream he uses for the rash he occasionally gets from his past cancer treatment.  He says this cream works much better that over the counter cream.  Message sent to [facility] team to request a refill.  Case Manager Clinical Goal(s):  Over the next 30-60 days, patient will verbalize understanding of plan for hypertension management Over the next 30-60 days, patient will attend all scheduled medical appointments.  Interventions:  Evaluation of  current treatment plan related to hypertension self management and patient's adherence to plan as established by provider.- Patient notes he is taking his medications as ordered and does not forget any doses.  Reviewed lifestyle strategies to lower blood pressure- Patient is following a low sodium diet Reviewed medications with patient and assessed medication taking behavior Positive reinforcement given to patient for implementing strategies to improve his blood pressure and for checking his BP twice daily Reviewed self monitored blood pressure readings and BP target Discussed plans with patient for ongoing care management follow up and provided patient with direct contact information for care management team Reviewed scheduled/upcoming provider appointments including: none at present  Patient Self Care Activities:  UNABLE to independently manage HTN to meet treatment targets Self administers medications as prescribed Attends all scheduled provider appointments Follows a low sodium diet/DASH diet - check blood pressure at least daily - choose a place to take my blood pressure (home, clinic or office, retail store) - write blood pressure results in a log or diary     Care Plan : CCM RN- Diabetes Type 2 (Adult)  Updates made by [name], RN since 08/10/2021 12:00 AM     Problem: Glycemic Management (Diabetes, Type 2)      Long-Range Goal: Glycemic Management Optimized without requiring DM medication per patient goal   Start Date: 11/25/2020  Recent Progress: On track  Priority: High  Note:   Objective:  Lab Results  Component Value Date   HGBA1C 6.3 (A) 11/25/2020   Lab Results  Component Value Date   CREATININE 0.90 12/18/2019   CREATININE 0.70 (L) 11/06/2019   CREATININE 0.78 04/10/2019   No results found for: EGFR Current Barriers:  Knowledge Deficits related to basic Diabetes pathophysiology and self care/management-  Unable to independently self educate regarding  diabetes self management  Case Manager Clinical Goal(s):  patient will demonstrate improved adherence to prescribed treatment plan for diabetes self care/management as evidenced by: adherence to ADA/ carb modified diet contacting provider for new or worsened symptoms or questions, achieve good glycemic control without need for DM medication Interventions:  Collaboration with Harvie Heck, MD regarding development and update of comprehensive plan of care as evidenced by provider attestation and co-signature Inter-disciplinary care team collaboration (see longitudinal plan of care) Allowed time for questions r/t diabetes and controlling diabetes without the use of medications  Review of patient status, including review of consultants reports, relevant laboratory and other test results, and medications completed. Patient Goals: Set My Target A1C and manage blood sugar to meet target of <6.5 without requiring DM medications  Self-Care Activities - Attends all scheduled provider appointments Adheres to prescribed ADA/carb modified.       Plan: Telephone follow up appointment with care management team member scheduled for:  43 days  [name], RN, BSN, CCM Care Management Coordinator [facility] Phone: [phone]: [phone]

## 2021-08-11 ENCOUNTER — Other Ambulatory Visit: Payer: Self-pay | Admitting: Student

## 2021-08-11 MED ORDER — TRIAMCINOLONE ACETONIDE 0.025 % EX OINT: TOPICAL_OINTMENT | CUTANEOUS | 2 refills | Status: DC

## 2021-08-11 NOTE — Progress Notes (Signed)
Refill sent in for patient's triamcinolone cream. Patient has a recurrent rash during chemotherapy treatment.

## 2021-09-14 ENCOUNTER — Telehealth: Payer: Medicare Other

## 2021-09-14 ENCOUNTER — Ambulatory Visit: Payer: Medicare Other

## 2021-09-14 NOTE — Patient Instructions (Signed)
Visit Information  Thank you for taking time to visit with me today. Please don't hesitate to contact me if I can be of assistance to you before our next scheduled telephone appointment.  Our next appointment is by telephone on 10/26/21 at 0930  Please call the care guide team at [phone] if you need to cancel or reschedule your appointment.   If you are experiencing a Mental Health or [place] or need someone to talk to, please call the [place] National Suicide Prevention Lifeline: 2534393095 or TTY: (570) 877-9075 TTY [phone]) to talk to a trained counselor   The patient verbalized understanding of instructions, educational materials, and care plan provided today and declined offer to receive copy of patient instructions, educational materials, and care plan.   The patient has been provided with contact information for the care management team and has been advised to call with any health related questions or concerns.  William Killian, RN, BSN, CCM Care Management Coordinator [facility] Phone: [phone]: [phone]

## 2021-09-14 NOTE — Chronic Care Management (AMB) (Signed)
Care Management    RN Visit Note  09/14/2021 Name: William Jones MRN: [id number] DOB: 1951-09-09  Subjective: William Jones is a 70 y.o. year old male who is a primary care patient of Aslam, [name], MD. The care management team was consulted for assistance with disease management and care coordination needs.    Engaged with patient by telephone for follow up visit in response to provider referral for case management and/or care coordination services.   Consent to Services:   William Jones was given information about Care Management services today including:  Care Management services includes personalized support from designated clinical staff supervised by his physician, including individualized plan of care and coordination with other care providers 24/7 contact phone numbers for assistance for urgent and routine care needs. The patient may stop case management services at any time by phone call to the office staff.  Patient agreed to services and consent obtained.   Assessment: Review of patient past medical history, allergies, medications, health status, including review of consultants reports, laboratory and other test data, was performed as part of comprehensive evaluation and provision of chronic care management services.   SDOH (Social Determinants of Health) assessments and interventions performed:    Care Plan  No Known Allergies  Outpatient Encounter Medications as of 09/14/2021  Medication Sig   acetaminophen (TYLENOL) 500 MG tablet Take 2 tablets (1,000 mg total) by mouth every 6 (six) hours as needed for mild pain or moderate pain.   amLODipine (NORVASC) 10 MG tablet Take 1 tablet (10 mg total) by mouth daily.   atorvastatin (LIPITOR) 40 MG tablet Take 1 tablet (40 mg total) by mouth daily.   BETIMOL 0.5 % ophthalmic solution INT 1 GTT IN OD BID   brimonidine (ALPHAGAN) 0.2 % ophthalmic solution INT 1 GTT INTO THE RIGHT EYE BID   CALCIUM CITRATE PO Take  600 mg by mouth in the morning and at bedtime.   cholecalciferol (VITAMIN D3) 10 MCG (400 UNIT) TABS tablet Take 1,000 Units by mouth daily.   cyclobenzaprine (FLEXERIL) 10 MG tablet Take 1 tablet (10 mg total) by mouth 2 (two) times daily as needed for muscle spasms.   dorzolamide (TRUSOPT) 2 % ophthalmic solution INT 1 GTT IN OD BID   ferrous sulfate 325 (65 FE) MG tablet Take 1 tablet (325 mg total) by mouth daily with breakfast.   hydrochlorothiazide (HYDRODIURIL) 25 MG tablet TAKE 1 TABLET(25 MG) BY MOUTH DAILY   Multiple Vitamins-Minerals (ONE-A-DAY PROACTIVE 65+) TABS Take 1 tablet by mouth daily.   ofloxacin (OCUFLOX) 0.3 % ophthalmic solution    prednisoLONE acetate (PRED FORTE) 1 % ophthalmic suspension    sildenafil (VIAGRA) 100 MG tablet    timolol (TIMOPTIC) 0.5 % ophthalmic solution PLACE ONE DROP 2 TIMES INTO THE RIGHT EYE UTD   triamcinolone (KENALOG) 0.025 % ointment APPLY EXTERNALLY TO THE AFFECTED AREA TWICE DAILY   vitamin E 180 MG (400 UNITS) capsule Take 400 Units by mouth daily.   XTANDI 40 MG capsule Take 160 mg by mouth daily.   No facility-administered encounter medications on file as of 09/14/2021.    Patient Active Problem List   Diagnosis Date Noted   Rash 07/07/2021   Cervical strain 05/27/2021   Fatigue 11/06/2020   First degree hemorrhoids 10/14/2020   Thrombosed hemorrhoids 10/14/2020   Type 2 diabetes mellitus ([place]) 12/18/2019   Hyperlipidemia 11/20/2019   Prostate cancer metastatic to bone ([place]) 12/29/2018   Glaucoma 08/21/2018   Leukopenia 04/20/2017  Rectal mass 09/15/2016   Healthcare maintenance 06/23/2016   Essential hypertension 07/16/2015   Erectile dysfunction 01/23/2014   Iron deficiency anemia 01/08/2014    Conditions to be addressed/monitored: HTN and DMII  Care Plan : CCM RN Hypertension (Adult)  Updates made by [name], RN since 09/14/2021 12:00 AM     Problem: Hypertension (Hypertension)      Long-Range Goal:  Hypertension Monitored and Managed to meet treatment targets of <130/<80   Start Date: 06/25/2020  Expected End Date: 07/03/2021  Recent Progress: Not on track  Priority: High  Note:   Current Barriers: Successful outreach to patient this morning.  Patient notes he picked up his Kenalog cream and his cholesterol medication which was recently called in.  He stated his blood pressure readings have been good in the 120-130 range over 80-85.  He shared he is feeling very well and having no issues at this time.  Encouraged patient to call this RNCM with any issues or concerns. Knowledge Deficits related to plan of care for management of HTN and DMII  Chronic Disease Management support and education needs related to HTN and DMII   RNCM Clinical Goal(s):  Patient will take all medications exactly as prescribed and will call provider for medication related questions as evidenced by discussions with RNCM and requests for refills of medications. demonstrate Ongoing adherence to prescribed treatment plan for HTN and DMII as evidenced by A1C results and blood pressure. continue to work with RN Care Manager to address care management and care coordination needs related to  HTN and DMII as evidenced by adherence to CM Team Scheduled appointments through collaboration with RN Care manager, provider, and care team.   Interventions: 1:1 collaboration with primary care provider regarding development and update of comprehensive plan of care as evidenced by provider attestation and co-signature Inter-disciplinary care team collaboration (see longitudinal plan of care) Evaluation of current treatment plan related to  self management and patient's adherence to plan as established by provider   Diabetes Interventions:  (Status:  Goal on track:  Yes.) Long Term Goal Assessed patient's understanding of A1c goal: <7% Reviewed medications with patient and discussed importance of medication adherence Discussed plans with  patient for ongoing care management follow up and provided patient with direct contact information for care management team Review of patient status, including review of consultants reports, relevant laboratory and other test results, and medications completed Lab Results  Component Value Date   HGBA1C 6.4 (A) 07/07/2021   Hypertension Interventions:  (Status:  Goal on track:  NO.) Long Term Goal Last practice recorded BP readings:  BP Readings from Last 3 Encounters:  07/07/21 (!) 149/93  05/26/21 (!) 154/91  05/14/21 137/88  Most recent eGFR/CrCl: No results found for: EGFR  No components found for: CRCL  Evaluation of current treatment plan related to hypertension self management and patient's adherence to plan as established by provider Reviewed medications with patient and discussed importance of compliance Discussed plans with patient for ongoing care management follow up and provided patient with direct contact information for care management team Advised patient, providing education and rationale, to monitor blood pressure daily and record, calling PCP for findings outside established parameters  Patient Goals/Self-Care Activities: Take all medications as prescribed Attend all scheduled provider appointments Call pharmacy for medication refills 3-7 days in advance of running out of medications Call provider office for new concerns or questions  keep appointment with eye doctor trim toenails straight across check blood pressure 3 times  per week choose a place to take my blood pressure (home, clinic or office, retail store) write blood pressure results in a log or diary take blood pressure log to all doctor appointments keep all doctor appointments report new symptoms to your doctor  Follow Up Plan:  Telephone follow up appointment with care management team member scheduled for:  30-45 days       Plan: The patient has been provided with contact information for the care  management team and has been advised to call with any health related questions or concerns.   [name], RN, BSN, CCM Care Management Coordinator [facility] Phone: [phone]: [phone]

## 2021-10-26 ENCOUNTER — Ambulatory Visit: Payer: Medicare Other

## 2021-10-26 NOTE — Patient Instructions (Signed)
Visit Information  Thank you for taking time to visit with me today. Please don't hesitate to contact me if I can be of assistance to you before our next scheduled telephone appointment.  Our next appointment is by telephone on 12/07/21 at 0930  Please call the care guide team at [phone] if you need to cancel or reschedule your appointment.   If you are experiencing a Mental Health or [place] or need someone to talk to, please call the [place] National Suicide Prevention Lifeline: (917)758-1279 or TTY: 3213912941 TTY [phone]) to talk to a trained counselor   The patient verbalized understanding of instructions, educational materials, and care plan provided today and declined offer to receive copy of patient instructions, educational materials, and care plan.   The patient has been provided with contact information for the care management team and has been advised to call with any health related questions or concerns.   [name], RN, BSN, CCM Care Management Coordinator [facility] Phone: [phone]: [phone]

## 2021-10-26 NOTE — Chronic Care Management (AMB) (Signed)
Care Management    RN Visit Note  10/26/2021 Name: William Jones MRN: [id number] DOB: [birth year]  Subjective: William Jones is a 71 y.o. year old male who is a primary care patient of Aslam, [name], MD. The care management team was consulted for assistance with disease management and care coordination needs.    Engaged with patient by telephone for follow up visit in response to provider referral for case management and/or care coordination services.   Consent to Services:   William Jones was given information about Care Management services today including:  Care Management services includes personalized support from designated clinical staff supervised by his physician, including individualized plan of care and coordination with other care providers 24/7 contact phone numbers for assistance for urgent and routine care needs. The patient may stop case management services at any time by phone call to the office staff.  Patient agreed to services and consent obtained.   Assessment: Review of patient past medical history, allergies, medications, health status, including review of consultants reports, laboratory and other test data, was performed as part of comprehensive evaluation and provision of chronic care management services.   SDOH (Social Determinants of Health) assessments and interventions performed:    Care Plan  No Known Allergies  Outpatient Encounter Medications as of 10/26/2021  Medication Sig   acetaminophen (TYLENOL) 500 MG tablet Take 2 tablets (1,000 mg total) by mouth every 6 (six) hours as needed for mild pain or moderate pain.   amLODipine (NORVASC) 10 MG tablet Take 1 tablet (10 mg total) by mouth daily.   atorvastatin (LIPITOR) 40 MG tablet Take 1 tablet (40 mg total) by mouth daily.   BETIMOL 0.5 % ophthalmic solution INT 1 GTT IN OD BID   brimonidine (ALPHAGAN) 0.2 % ophthalmic solution INT 1 GTT INTO THE RIGHT EYE BID   CALCIUM CITRATE PO Take  600 mg by mouth in the morning and at bedtime.   cholecalciferol (VITAMIN D3) 10 MCG (400 UNIT) TABS tablet Take 1,000 Units by mouth daily.   cyclobenzaprine (FLEXERIL) 10 MG tablet Take 1 tablet (10 mg total) by mouth 2 (two) times daily as needed for muscle spasms.   dorzolamide (TRUSOPT) 2 % ophthalmic solution INT 1 GTT IN OD BID   ferrous sulfate 325 (65 FE) MG tablet Take 1 tablet (325 mg total) by mouth daily with breakfast.   hydrochlorothiazide (HYDRODIURIL) 25 MG tablet TAKE 1 TABLET(25 MG) BY MOUTH DAILY   Multiple Vitamins-Minerals (ONE-A-DAY PROACTIVE 65+) TABS Take 1 tablet by mouth daily.   ofloxacin (OCUFLOX) 0.3 % ophthalmic solution    prednisoLONE acetate (PRED FORTE) 1 % ophthalmic suspension    sildenafil (VIAGRA) 100 MG tablet    timolol (TIMOPTIC) 0.5 % ophthalmic solution PLACE ONE DROP 2 TIMES INTO THE RIGHT EYE UTD   triamcinolone (KENALOG) 0.025 % ointment APPLY EXTERNALLY TO THE AFFECTED AREA TWICE DAILY   vitamin E 180 MG (400 UNITS) capsule Take 400 Units by mouth daily.   XTANDI 40 MG capsule Take 160 mg by mouth daily.   No facility-administered encounter medications on file as of 10/26/2021.    Patient Active Problem List   Diagnosis Date Noted   Rash 07/07/2021   Cervical strain 05/27/2021   Fatigue 11/06/2020   First degree hemorrhoids 10/14/2020   Thrombosed hemorrhoids 10/14/2020   Type 2 diabetes mellitus ([place]) 12/18/2019   Hyperlipidemia 11/20/2019   Prostate cancer metastatic to bone ([place]) 12/29/2018   Glaucoma 08/21/2018   Leukopenia 04/20/2017  Rectal mass 09/15/2016   Healthcare maintenance 06/23/2016   Essential hypertension 07/16/2015   Erectile dysfunction 01/23/2014   Iron deficiency anemia 01/08/2014    Conditions to be addressed/monitored: HTN and DMII  Care Plan : CCM RN Hypertension (Adult)  Updates made by [name], RN since 10/26/2021 12:00 AM     Problem: Hypertension (Hypertension)      Long-Range Goal:  Hypertension Monitored and Managed to meet treatment targets of <130/<80   Start Date: 06/25/2020  Expected End Date: 07/03/2021  Recent Progress: Not on track  Priority: High  Note:   Current Barriers: Successful outreach to patient this morning.  Patient noted he had a nurse come from [employer] for a yearly visit.  His blood pressure was 130/80.  He is feeling very well.  He shared that the nurse from [facility] recommended he get a pneumonia shot and he was going to call the clinic to make an appointment.  Patient to call this RNCM with any concerns or needs. Knowledge Deficits related to plan of care for management of HTN and DMII  Chronic Disease Management support and education needs related to HTN and DMII   RNCM Clinical Goal(s):  Patient will take all medications exactly as prescribed and will call provider for medication related questions as evidenced by discussions with RNCM and requests for refills of medications. demonstrate Ongoing adherence to prescribed treatment plan for HTN and DMII as evidenced by A1C results and blood pressure. continue to work with RN Care Manager to address care management and care coordination needs related to  HTN and DMII as evidenced by adherence to CM Team Scheduled appointments through collaboration with RN Care manager, provider, and care team.   Interventions: 1:1 collaboration with primary care provider regarding development and update of comprehensive plan of care as evidenced by provider attestation and co-signature Inter-disciplinary care team collaboration (see longitudinal plan of care) Evaluation of current treatment plan related to  self management and patient's adherence to plan as established by provider   Diabetes Interventions:  (Status:  Goal on track:  Yes.) Long Term Goal Assessed patient's understanding of A1c goal: <7% Reviewed medications with patient and discussed importance of medication adherence Discussed plans with patient  for ongoing care management follow up and provided patient with direct contact information for care management team Review of patient status, including review of consultants reports, relevant laboratory and other test results, and medications completed Lab Results  Component Value Date   HGBA1C 6.4 (A) 07/07/2021   Hypertension Interventions:  (Status:  Goal on track:  NO.) Long Term Goal Last practice recorded BP readings:  BP Readings from Last 3 Encounters:  07/07/21 (!) 149/93  05/26/21 (!) 154/91  05/14/21 137/88  Most recent eGFR/CrCl: No results found for: EGFR  No components found for: CRCL  Evaluation of current treatment plan related to hypertension self management and patient's adherence to plan as established by provider Reviewed medications with patient and discussed importance of compliance Discussed plans with patient for ongoing care management follow up and provided patient with direct contact information for care management team Advised patient, providing education and rationale, to monitor blood pressure daily and record, calling PCP for findings outside established parameters  Patient Goals/Self-Care Activities: Take all medications as prescribed Attend all scheduled provider appointments Call pharmacy for medication refills 3-7 days in advance of running out of medications Call provider office for new concerns or questions  keep appointment with eye doctor trim toenails straight across check blood  pressure 3 times per week choose a place to take my blood pressure (home, clinic or office, retail store) write blood pressure results in a log or diary take blood pressure log to all doctor appointments keep all doctor appointments report new symptoms to your doctor  Follow Up Plan:  Telephone follow up appointment with care management team member scheduled for:  30-45 days       Plan: The patient has been provided with contact information for the care management  team and has been advised to call with any health related questions or concerns.   [name], RN, BSN, CCM Care Management Coordinator [facility] Phone: [phone]: [phone]

## 2021-11-04 ENCOUNTER — Encounter: Payer: Self-pay | Admitting: *Deleted

## 2021-11-11 ENCOUNTER — Other Ambulatory Visit: Payer: Self-pay

## 2021-11-11 MED ORDER — HYDROCHLOROTHIAZIDE 25 MG PO TABS: ORAL_TABLET | ORAL | 1 refills | Status: DC

## 2021-11-25 ENCOUNTER — Ambulatory Visit (INDEPENDENT_AMBULATORY_CARE_PROVIDER_SITE_OTHER): Payer: Medicare Other | Admitting: Internal Medicine

## 2021-11-25 ENCOUNTER — Encounter: Payer: Self-pay | Admitting: Internal Medicine

## 2021-11-25 VITALS — BP 133/90 | HR 80 | Temp 98.3°F | Ht 69.0 in | Wt 190.0 lb

## 2021-11-25 DIAGNOSIS — I10 Essential (primary) hypertension: Secondary | ICD-10-CM

## 2021-11-25 DIAGNOSIS — C61 Malignant neoplasm of prostate: Secondary | ICD-10-CM

## 2021-11-25 DIAGNOSIS — E782 Mixed hyperlipidemia: Secondary | ICD-10-CM

## 2021-11-25 DIAGNOSIS — E119 Type 2 diabetes mellitus without complications: Secondary | ICD-10-CM | POA: Diagnosis not present

## 2021-11-25 DIAGNOSIS — H409 Unspecified glaucoma: Secondary | ICD-10-CM | POA: Diagnosis not present

## 2021-11-25 DIAGNOSIS — Z23 Encounter for immunization: Secondary | ICD-10-CM | POA: Diagnosis not present

## 2021-11-25 DIAGNOSIS — Z0000 Encounter for general adult medical examination without abnormal findings: Secondary | ICD-10-CM

## 2021-11-25 DIAGNOSIS — C7951 Secondary malignant neoplasm of bone: Secondary | ICD-10-CM

## 2021-11-25 LAB — GLUCOSE, CAPILLARY: Glucose-Capillary: 120 mg/dL — ABNORMAL HIGH (ref 70–99)

## 2021-11-25 LAB — POCT GLYCOSYLATED HEMOGLOBIN (HGB A1C): Hemoglobin A1C: 6.7 % — AB (ref 4.0–5.6)

## 2021-11-25 NOTE — Progress Notes (Signed)
° °  CC: hypertension follow up   HPI:  [name] is a 71 y.o. male with PMHx as stated below presenting for hypertension follow up. He does not have any acute concerns today. Please see problem based charting for complete assessment and plan.  Past Medical History:  Diagnosis Date   Angiodysplasia of colon with hemorrhage 10/14/2020   Duodenitis    Essential hypertension 07/16/2015   Glaucoma    H/O: substance abuse ([place])    Hemorrhage of rectum and anus 10/14/2020   History of frostbite    History of prostate cancer 03/20/2014   Lost to f/u with [facility] in the past 2011 with elevated PSA >30 at that time.  Saw [facility] (Dr. Risa Jones) on 03/13/14. Cancer Noted 03/13/14 office visit. Gleason score 7. Plan CT Ab/pelvis with contrast, bone scan    History of radiation therapy nov 2015 to feb 2016   Hyperlipidemia 11/20/2019   Iron deficiency anemia    Malignant tumor of prostate ([place]) 10/14/2020   Peripheral neuropathy    Prostate cancer ([place]) 03/05/14   Gleason 7, volume 45 gm   Rash and nonspecific skin eruption 08/04/2017   S/P radiation therapy 09/18/14-11/15/14   prostate/ 7800Gy/40sessions   Thrombocytopenia ([place])    Review of Systems:  Negative except as stated in HPI.  Physical Exam:  Vitals:   11/25/21 1002  BP: 133/90  Pulse: 80  Temp: 98.3 F (36.8 C)  TempSrc: Oral  SpO2: 100%  Weight: 190 lb (86.2 kg)  Height: 5\' 9"  (1.753 m)   Physical Exam  Constitutional: Appears well-developed and well-nourished. No distress.  Cardiovascular: Normal rate, regular rhythm, S1 and S2 present, no murmurs, rubs, gallops.  Distal pulses intact Respiratory:  Effort is normal.  Lungs are clear to auscultation bilaterally. Musculoskeletal: Normal bulk and tone.  No peripheral edema noted. Neurological: Is alert and oriented x4, no apparent focal deficits noted. Skin: Warm and dry.  No rash, erythema, lesions noted. Psychiatric: Normal mood and affect.    Assessment & Plan:   See Encounters Tab for problem based charting.  Patient discussed with Dr.  Saverio Danker

## 2021-11-25 NOTE — Assessment & Plan Note (Signed)
Pt received pneumonia vaccine at this visit Most recent flex sig 03/2021 - negative; next due in 2027.

## 2021-11-25 NOTE — Assessment & Plan Note (Addendum)
Most recent A1c 6.4, diet controlled. Denies any symptoms at this time  Plan: Repeat A1c today   ADDENDUM: A1c elevated to 6.7 today. Discussed results with patient. At this time, recommended to start metformin for which he is agreeable.   Plan: Start Metformin XR 500mg  daily Follow up A1c at next visit in 3 months Uptitrate as tolerated

## 2021-11-25 NOTE — Assessment & Plan Note (Addendum)
BP Readings from Last 3 Encounters:  11/25/21 133/90  07/07/21 (!) 149/93  05/26/21 (!) 154/91   Patient is following up for his hypertension today. He is on amlodipine 10mg  daily and HCTZ 25mg  daily. He reports medication adherence. He is asymptomatic at this time. Home blood pressure readings range systolic 016-010.   Plan: Continue current regimen Continue DASH diet BMP today  Follow up in 3 months for BP check

## 2021-11-25 NOTE — Patient Instructions (Addendum)
Mr William Jones,  It was a pleasure seeing you in clinic. Today we discussed:   Blood pressure:  Continue taking your amlodipine and hydrochlorothiazide as prescribed and checking your blood pressures at home.  We are checking some blood work today. I will call you with any abnormal results.  You received your pneumonia vaccine at this visit.  Referral to eye doctor placed at this visit.   If you have any questions or concerns, please call our clinic at [phone] between 9am-5pm and after hours call [phone] and ask for the internal medicine resident on call. If you feel you are having a medical emergency please call 911.   Thank you, we look forward to helping you remain healthy!

## 2021-11-25 NOTE — Assessment & Plan Note (Signed)
Patient continues to follow with Dr Tammi Klippel for this - reports that he has an appointment tomorrow. He is continued on Xtandi 160mg  daily. Denies any concerns at this time

## 2021-11-25 NOTE — Assessment & Plan Note (Signed)
Patient is currently using eye drops for this. Has not followed up with ophthalmologist yet. Encouraged to do so.  Plan: Referral to ophthalmology

## 2021-11-26 LAB — BMP8+ANION GAP
Anion Gap: 14 mmol/L (ref 10.0–18.0)
BUN/Creatinine Ratio: 22 (ref 10–24)
BUN: 16 mg/dL (ref 8–27)
CO2: 24 mmol/L (ref 20–29)
Calcium: 10 mg/dL (ref 8.6–10.2)
Chloride: 101 mmol/L (ref 96–106)
Creatinine, Ser: 0.73 mg/dL — ABNORMAL LOW (ref 0.76–1.27)
Glucose: 125 mg/dL — ABNORMAL HIGH (ref 70–99)
Potassium: 4.8 mmol/L (ref 3.5–5.2)
Sodium: 139 mmol/L (ref 134–144)
eGFR: 99 mL/min/{1.73_m2} (ref >59)

## 2021-11-26 LAB — LIPID PANEL
Chol/HDL Ratio: 3.8 ratio (ref 0.0–5.0)
Cholesterol, Total: 215 mg/dL — ABNORMAL HIGH (ref 100–199)
HDL: 56 mg/dL (ref >39)
LDL Chol Calc (NIH): 143 mg/dL — ABNORMAL HIGH (ref 0–99)
Triglycerides: 89 mg/dL (ref 0–149)
VLDL Cholesterol Cal: 16 mg/dL (ref 5–40)

## 2021-11-26 MED ORDER — METFORMIN HCL ER 500 MG PO TB24: 500.0000 mg | ORAL_TABLET | Freq: Every day | ORAL | 0 refills | Status: DC

## 2021-11-26 NOTE — Assessment & Plan Note (Signed)
Lipid Panel     Component Value Date/Time   CHOL 215 (H) 11/25/2021 1034   TRIG 89 11/25/2021 1034   HDL 56 11/25/2021 1034   CHOLHDL 3.8 11/25/2021 1034   CHOLHDL 3.9 Ratio 12/16/2009 2119   VLDL 22 12/16/2009 2119   LDLCALC 143 (H) 11/25/2021 1034   LABVLDL 16 11/25/2021 1034   ASCVD risk remains elevated at 32.8%.   Plan: Continue atorvastatin 40mg  daily

## 2021-11-26 NOTE — Addendum Note (Signed)
Addended by: Harvie Heck on: 11/26/2021 10:39 AM   Modules accepted: Orders

## 2021-11-27 NOTE — Progress Notes (Signed)
Internal Medicine Clinic Attending ° °Case discussed with Dr. Aslam  At the time of the visit.  We reviewed the resident’s history and exam and pertinent patient test results.  I agree with the assessment, diagnosis, and plan of care documented in the resident’s note.  °

## 2021-12-07 ENCOUNTER — Ambulatory Visit: Payer: Medicare Other

## 2021-12-07 NOTE — Patient Instructions (Signed)
Visit Information ? ?Thank you for taking time to visit with me today. Please don't hesitate to contact me if I can be of assistance to you before our next scheduled telephone appointment. ? ?Our next appointment is by telephone on 02/01/22 at 0930. ? ?Please call the care guide team at [phone] if you need to cancel or reschedule your appointment.  ? ?If you are experiencing a Mental Health or [place] or need someone to talk to, please call the [place] National Suicide Prevention Lifeline: 4231685199 or TTY: [phone] TTY [phone]) to talk to a trained counselor  ? ?The patient verbalized understanding of instructions, educational materials, and care plan provided today and declined offer to receive copy of patient instructions, educational materials, and care plan.  ? ?[name], RN, BSN, CCM ?Care Management Coordinator ?[facility] ?Phone: [phone]/UUV: [phone]  ?

## 2021-12-07 NOTE — Chronic Care Management (AMB) (Signed)
Care Management    RN Visit Note  12/07/2021 Name: William Jones MRN: [id number] DOB: 01/12/1951  Subjective: William Jones is a 71 y.o. year old male who is a primary care patient of Aslam, Leanna Sato, MD. The care management team was consulted for assistance with disease management and care coordination needs.    Engaged with patient by telephone for follow up visit in response to provider referral for case management and/or care coordination services.   Consent to Services:   Mr. Summerlin was given information about Care Management services today including:  Care Management services includes personalized support from designated clinical staff supervised by his physician, including individualized plan of care and coordination with other care providers 24/7 contact phone numbers for assistance for urgent and routine care needs. The patient may stop case management services at any time by phone call to the office staff.  Patient agreed to services and consent obtained.   Assessment: Review of patient past medical history, allergies, medications, health status, including review of consultants reports, laboratory and other test data, was performed as part of comprehensive evaluation and provision of chronic care management services.   SDOH (Social Determinants of Health) assessments and interventions performed:    Care Plan  No Known Allergies  Outpatient Encounter Medications as of 12/07/2021  Medication Sig   acetaminophen (TYLENOL) 500 MG tablet Take 2 tablets (1,000 mg total) by mouth every 6 (six) hours as needed for mild pain or moderate pain.   amLODipine (NORVASC) 10 MG tablet Take 1 tablet (10 mg total) by mouth daily.   atorvastatin (LIPITOR) 40 MG tablet Take 1 tablet (40 mg total) by mouth daily.   BETIMOL 0.5 % ophthalmic solution INT 1 GTT IN OD BID   brimonidine (ALPHAGAN) 0.2 % ophthalmic solution INT 1 GTT INTO THE RIGHT EYE BID   CALCIUM CITRATE PO Take 600  mg by mouth in the morning and at bedtime.   cholecalciferol (VITAMIN D3) 10 MCG (400 UNIT) TABS tablet Take 1,000 Units by mouth daily.   cyclobenzaprine (FLEXERIL) 10 MG tablet Take 1 tablet (10 mg total) by mouth 2 (two) times daily as needed for muscle spasms.   dorzolamide (TRUSOPT) 2 % ophthalmic solution INT 1 GTT IN OD BID   ferrous sulfate 325 (65 FE) MG tablet Take 1 tablet (325 mg total) by mouth daily with breakfast.   hydrochlorothiazide (HYDRODIURIL) 25 MG tablet TAKE 1 TABLET(25 MG) BY MOUTH DAILY   metFORMIN (GLUCOPHAGE-XR) 500 MG 24 hr tablet Take 1 tablet (500 mg total) by mouth daily with breakfast.   Multiple Vitamins-Minerals (ONE-A-DAY PROACTIVE 65+) TABS Take 1 tablet by mouth daily.   ofloxacin (OCUFLOX) 0.3 % ophthalmic solution    prednisoLONE acetate (PRED FORTE) 1 % ophthalmic suspension    sildenafil (VIAGRA) 100 MG tablet    timolol (TIMOPTIC) 0.5 % ophthalmic solution PLACE ONE DROP 2 TIMES INTO THE RIGHT EYE UTD   triamcinolone (KENALOG) 0.025 % ointment APPLY EXTERNALLY TO THE AFFECTED AREA TWICE DAILY   vitamin E 180 MG (400 UNITS) capsule Take 400 Units by mouth daily.   XTANDI 40 MG capsule Take 160 mg by mouth daily.   No facility-administered encounter medications on file as of 12/07/2021.    Patient Active Problem List   Diagnosis Date Noted   Rash 07/07/2021   Cervical strain 05/27/2021   Fatigue 11/06/2020   First degree hemorrhoids 10/14/2020   Thrombosed hemorrhoids 10/14/2020   Type 2 diabetes mellitus ([facility]) 12/18/2019  Hyperlipidemia 11/20/2019   Prostate cancer metastatic to bone ([facility]) 12/29/2018   Glaucoma 08/21/2018   Leukopenia 04/20/2017   Rectal mass 09/15/2016   Healthcare maintenance 06/23/2016   Essential hypertension 07/16/2015   Erectile dysfunction 01/23/2014   Iron deficiency anemia 01/08/2014    Conditions to be addressed/monitored: HTN and DMII  Care Plan : CCM RN Hypertension (Adult)  Updates made by Jodelle Gross, RN since 12/07/2021 12:00 AM     Problem: Hypertension (Hypertension)      Long-Range Goal: Hypertension Monitored and Managed to meet treatment targets of <130/<80   Start Date: 06/25/2020  Expected End Date: 07/03/2021  Recent Progress: Not on track  Priority: High  Note:   Current Barriers: Successful outreach to patient this morning.  We discussed patients recent office visit and his new orders for starting Metformin 500mg  XR q day when his A1C of 6.7.  Patient states he has not had any side effects from starting this medication. Patient stated he was planning on calling his eye doctor today to make an appointment and the clinic for his follow up in 3 months.  Reviewed patient medications and he is taking as ordered, he does not need any refills at this time. Knowledge Deficits related to plan of care for management of HTN and DMII  Chronic Disease Management support and education needs related to HTN and DMII  RNCM Clinical Goal(s):  Patient will take all medications exactly as prescribed and will call provider for medication related questions as evidenced by discussions with RNCM and requests for refills of medications. demonstrate Ongoing adherence to prescribed treatment plan for HTN and DMII as evidenced by A1C results and blood pressure. continue to work with RN Care Manager to address care management and care coordination needs related to  HTN and DMII as evidenced by adherence to CM Team Scheduled appointments through collaboration with RN Care manager, provider, and care team.  Interventions: 1:1 collaboration with primary care provider regarding development and update of comprehensive plan of care as evidenced by provider attestation and co-signature Inter-disciplinary care team collaboration (see longitudinal plan of care) Evaluation of current treatment plan related to  self management and patient's adherence to plan as established by provider Diabetes Interventions:   (Status:  Goal on track:  Yes.) Long Term Goal Assessed patient's understanding of A1c goal: <7% Reviewed medications with patient and discussed importance of medication adherence Discussed plans with patient for ongoing care management follow up and provided patient with direct contact information for care management team Review of patient status, including review of consultants reports, relevant laboratory and other test results, and medications completed Lab Results  Component Value Date   HGBA1C 6.7 (A) 11/25/2021  Hypertension Interventions:  (Status:  Goal on track:  NO.) Long Term Goal Last practice recorded BP readings:  BP Readings from Last 3 Encounters:  11/25/21 133/90  07/07/21 (!) 149/93  05/26/21 (!) 154/91  Most recent eGFR/CrCl:  Lab Results  Component Value Date   EGFR 99 11/25/2021    No components found for: CRCL  Evaluation of current treatment plan related to hypertension self management and patient's adherence to plan as established by provider Reviewed medications with patient and discussed importance of compliance Discussed plans with patient for ongoing care management follow up and provided patient with direct contact information for care management team Advised patient, providing education and rationale, to monitor blood pressure daily and record, calling PCP for findings outside established parameters  Patient Goals/Self-Care Activities: Take all  medications as prescribed Attend all scheduled provider appointments Call pharmacy for medication refills 3-7 days in advance of running out of medications Call provider office for new concerns or questions  keep appointment with eye doctor trim toenails straight across check blood pressure 3 times per week choose a place to take my blood pressure (home, clinic or office, retail store) write blood pressure results in a log or diary take blood pressure log to all doctor appointments keep all doctor  appointments report new symptoms to your doctor  Follow Up Plan:  Telephone follow up appointment with care management team member scheduled for:  45-60 days            Jodelle Gross, RN, BSN, CCM Care Management Coordinator [facility] Phone: [phone]/Fax: [phone]

## 2021-12-10 ENCOUNTER — Encounter: Payer: Self-pay | Admitting: *Deleted

## 2022-01-15 ENCOUNTER — Encounter: Payer: Self-pay | Admitting: Internal Medicine

## 2022-01-15 ENCOUNTER — Ambulatory Visit (INDEPENDENT_AMBULATORY_CARE_PROVIDER_SITE_OTHER): Payer: Medicare Other | Admitting: Internal Medicine

## 2022-01-15 VITALS — BP 124/86 | HR 81 | Temp 97.8°F | Ht 69.0 in | Wt 187.9 lb

## 2022-01-15 DIAGNOSIS — D509 Iron deficiency anemia, unspecified: Secondary | ICD-10-CM

## 2022-01-15 DIAGNOSIS — E119 Type 2 diabetes mellitus without complications: Secondary | ICD-10-CM

## 2022-01-15 DIAGNOSIS — I10 Essential (primary) hypertension: Secondary | ICD-10-CM

## 2022-01-15 DIAGNOSIS — D72819 Decreased white blood cell count, unspecified: Secondary | ICD-10-CM

## 2022-01-15 DIAGNOSIS — E782 Mixed hyperlipidemia: Secondary | ICD-10-CM | POA: Diagnosis not present

## 2022-01-15 MED ORDER — AMLODIPINE-ATORVASTATIN 10-40 MG PO TABS: 1.0000 | ORAL_TABLET | Freq: Every day | ORAL | 3 refills | Status: DC

## 2022-01-15 MED ORDER — AMLODIPINE-ATORVASTATIN 10-80 MG PO TABS: 1.0000 | ORAL_TABLET | Freq: Every day | ORAL | 3 refills | Status: DC

## 2022-01-15 NOTE — Assessment & Plan Note (Signed)
BP Readings from Last 3 Encounters:  ?01/15/22 124/86  ?11/25/21 133/90  ?07/07/21 (!) 149/93  ? ?Patient is presenting today for hypertension follow up. Blood pressure is well-controlled today. He reports medication adherence and has also had 3lb weight loss due to dietary changes. He follows a low-sodium diet. BMP at prior visit with stable renal function and electrolytes. He denies any headache, vision changes, lightheadedness/dizziness, exertional chest pain, or shortness of breath or fatigue.  ? ?Plan: ?Continue amlodipine '10mg'$  daily ?Continue HCTZ '25mg'$  daily ?Continue DASH diet  ?Follow up in 3-6 months ?

## 2022-01-15 NOTE — Assessment & Plan Note (Signed)
Lipid Panel  ?   ?Component Value Date/Time  ? CHOL 215 (H) 11/25/2021 1034  ? TRIG 89 11/25/2021 1034  ? HDL 56 11/25/2021 1034  ? CHOLHDL 3.8 11/25/2021 1034  ? CHOLHDL 3.9 Ratio 12/16/2009 2119  ? VLDL 22 12/16/2009 2119  ? LDLCALC 143 (H) 11/25/2021 1034  ? LABVLDL 16 11/25/2021 1034  ? ?Most recent lipid panel improved; however, LDL remains above goal. ASCVD risk 29.3%.  ? ?Plan: ?Increase atorvastatin to '80mg'$  daily; combine with amlodipine '10mg'$  daily to decrease pill burden ?Repeat lipid panel in 6 months; if LDL remains >100, would add zetia  ?

## 2022-01-15 NOTE — Progress Notes (Signed)
? ?  CC: hypertension f/u ? ?HPI: ? ?William Jones is a 71 y.o. male with PMHx as stated below presenting for follow up of his hypertension. He denies any acute concerns at this time. Please see problem based charting for complete assessment and plan. ? ?Past Medical History:  ?Diagnosis Date  ? Angiodysplasia of colon with hemorrhage 10/14/2020  ? Duodenitis   ? Essential hypertension 07/16/2015  ? Glaucoma   ? H/O: substance abuse ([place])   ? Hemorrhage of rectum and anus 10/14/2020  ? History of frostbite   ? History of prostate cancer 03/20/2014  ? Lost to f/u with [facility] in the past 2011 with elevated PSA >30 at that time.  Saw [facility] (Dr. Risa Grill) on 03/13/14. Cancer Noted 03/13/14 office visit. Gleason score 7. Plan CT Ab/pelvis with contrast, bone scan   ? History of radiation therapy nov 2015 to feb 2016  ? Hyperlipidemia 11/20/2019  ? Iron deficiency anemia   ? Malignant tumor of prostate ([place]) 10/14/2020  ? Peripheral neuropathy   ? Prostate cancer ([place]) 03/05/14  ? Gleason 7, volume 45 gm  ? Rash and nonspecific skin eruption 08/04/2017  ? S/P radiation therapy 09/18/14-11/15/14  ? prostate/ 7800Gy/40sessions  ? Thrombocytopenia ([place])   ? ?Review of Systems:  Negative except as stated in HPI. ? ?Physical Exam: ? ?Vitals:  ? 01/15/22 0951  ?BP: 124/86  ?Pulse: 81  ?Temp: 97.8 ?F (36.6 ?C)  ?TempSrc: Oral  ?SpO2: 100%  ?Weight: 187 lb 14.4 oz (85.2 kg)  ?Height: '5\' 9"'$  (1.753 m)  ? ?Physical Exam  ?Constitutional: Appears well-developed and well-nourished. No distress.  ?HENT: Normocephalic and atraumatic ?Cardiovascular: Normal rate, regular rhythm, S1 and S2 present. Distal pulses intact ?Respiratory: Effort is normal on room air. Lungs are clear to auscultation bilaterally. ?Musculoskeletal: Normal bulk and tone.  No peripheral edema noted. ?Neurological: Is alert and oriented x4, no apparent focal deficits noted. ?Skin: Warm and dry.  No rash, erythema, lesions noted. ?Psychiatric:  Normal mood and affect.  ? ?Assessment & Plan:  ? ?See Encounters Tab for problem based charting. ? ?Patient discussed with Dr. Jimmye Norman ? ?

## 2022-01-15 NOTE — Patient Instructions (Addendum)
William Jones, ? ?It was a pleasure seeing you in clinic. Today we discussed:  ? ?Blood pressure: ?Your blood pressure looks great today! At this time, I will combine your amlodipine and atorvastatin into one pill. Continue hydrochlorothiazide daily as prescribed.  ? ?Iron deficiency: ?I will check your lab work today and will call you with the results. If your iron remains low, we may need to pursue further work up  ? ?Diabetes: ?Continue metformin daily. Follow up in 2-3 months for repeat A1c.  ? ?If you have any questions or concerns, please call our clinic at [phone] between 9am-5pm and after hours call [phone] and ask for the internal medicine resident on call. If you feel you are having a medical emergency please call 911.  ? ?Thank you, we look forward to helping you remain healthy! ? ? ? ?

## 2022-01-15 NOTE — Assessment & Plan Note (Signed)
Patient's A1c was noted to be elevated to 6.7 at last visit for which he was started on Metformin XR '500mg'$  daily. He reports tolerating this well.  ? ?Plan: ?Continue metformin XR '500mg'$  daily ?Repeat A1c in 2 months  ?

## 2022-01-16 LAB — IRON,TIBC AND FERRITIN PANEL
Ferritin: 97 ng/mL (ref 30–400)
Iron Saturation: 27 % (ref 15–55)
Iron: 91 ug/dL (ref 38–169)
Total Iron Binding Capacity: 337 ug/dL (ref 250–450)
UIBC: 246 ug/dL (ref 111–343)

## 2022-01-16 LAB — CBC
Hematocrit: 34.8 % — ABNORMAL LOW (ref 37.5–51.0)
Hemoglobin: 11.8 g/dL — ABNORMAL LOW (ref 13.0–17.7)
MCH: 28 pg (ref 26.6–33.0)
MCHC: 33.9 g/dL (ref 31.5–35.7)
MCV: 83 fL (ref 79–97)
Platelets: 322 10*3/uL (ref 150–450)
RBC: 4.21 x10E6/uL (ref 4.14–5.80)
RDW: 13.3 % (ref 11.6–15.4)
WBC: 3 10*3/uL — ABNORMAL LOW (ref 3.4–10.8)

## 2022-01-17 ENCOUNTER — Encounter: Payer: Self-pay | Admitting: Internal Medicine

## 2022-01-17 NOTE — Assessment & Plan Note (Signed)
Patient has persistent leukopenia and anemia. He is on Xtandi for prostate cancer with metastasis. He follows with Dr Tammi Klippel and reports upcoming appointment this week.  ? ?Plan: ?Advised to follow up with heme/onc and will continue to trend CBC ?

## 2022-01-17 NOTE — Assessment & Plan Note (Addendum)
Patient has been on iron supplementation for iron deficiency anemia. Repeat iron studies wnl.  ? ?ADDENDUM: ?Will trial patient off of iron supplements and repeat iron studies in 6 months. If remains iron deficient, would benefit from further evaluation with colonoscopy.  ?

## 2022-01-19 NOTE — Addendum Note (Signed)
Addended by: Harvie Heck on: 01/19/2022 10:44 AM ? ? Modules accepted: Orders ? ?

## 2022-01-27 NOTE — Progress Notes (Signed)
Internal Medicine Clinic Attending ° °Case discussed with Dr. Aslam  At the time of the visit.  We reviewed the resident’s history and exam and pertinent patient test results.  I agree with the assessment, diagnosis, and plan of care documented in the resident’s note.  °

## 2022-02-01 ENCOUNTER — Telehealth: Payer: Medicare Other

## 2022-02-22 ENCOUNTER — Telehealth: Payer: Medicare Other

## 2022-02-22 ENCOUNTER — Telehealth: Payer: Self-pay

## 2022-02-22 NOTE — Telephone Encounter (Signed)
  Care Management   Outreach Note  02/22/2022 Name: SALEEM COCCIA MRN: [id number] DOB: 1951-01-18  Referred by: Harvie Heck, MD Reason for referral : No chief complaint on file.   An unsuccessful telephone outreach was attempted today. The patient was referred to the case management team for assistance with care management and care coordination.   Follow Up Plan: The care management team will reach out to the patient again over the next 30 days.   [name], RN, BSN, CCM Care Management Coordinator [facility] Internal Medicine Phone: [phone]: [phone]

## 2022-03-04 ENCOUNTER — Telehealth: Payer: Self-pay

## 2022-03-04 NOTE — Telephone Encounter (Signed)
Has an appointment scheduled for 03/18/2022.

## 2022-03-04 NOTE — Telephone Encounter (Signed)
triamcinolone (KENALOG) 0.025 % ointment, refill request @ [id number] - [place], [address] AT [address].

## 2022-03-18 ENCOUNTER — Encounter: Payer: Medicare Other | Admitting: Student

## 2022-03-19 ENCOUNTER — Encounter: Payer: Self-pay | Admitting: Student

## 2022-03-19 ENCOUNTER — Other Ambulatory Visit: Payer: Self-pay

## 2022-03-19 ENCOUNTER — Ambulatory Visit (INDEPENDENT_AMBULATORY_CARE_PROVIDER_SITE_OTHER): Payer: Medicare Other | Admitting: Student

## 2022-03-19 VITALS — BP 128/88 | HR 67 | Temp 97.9°F | Resp 28 | Ht 69.0 in | Wt 185.3 lb

## 2022-03-19 DIAGNOSIS — C61 Malignant neoplasm of prostate: Secondary | ICD-10-CM

## 2022-03-19 DIAGNOSIS — E119 Type 2 diabetes mellitus without complications: Secondary | ICD-10-CM | POA: Diagnosis not present

## 2022-03-19 DIAGNOSIS — C7951 Secondary malignant neoplasm of bone: Secondary | ICD-10-CM

## 2022-03-19 DIAGNOSIS — I10 Essential (primary) hypertension: Secondary | ICD-10-CM | POA: Diagnosis not present

## 2022-03-19 DIAGNOSIS — R21 Rash and other nonspecific skin eruption: Secondary | ICD-10-CM

## 2022-03-19 DIAGNOSIS — E782 Mixed hyperlipidemia: Secondary | ICD-10-CM | POA: Diagnosis not present

## 2022-03-19 DIAGNOSIS — Z794 Long term (current) use of insulin: Secondary | ICD-10-CM

## 2022-03-19 DIAGNOSIS — Z87891 Personal history of nicotine dependence: Secondary | ICD-10-CM

## 2022-03-19 LAB — POCT GLYCOSYLATED HEMOGLOBIN (HGB A1C): Hemoglobin A1C: 6.9 % — AB (ref 4.0–5.6)

## 2022-03-19 LAB — GLUCOSE, CAPILLARY: Glucose-Capillary: 109 mg/dL — ABNORMAL HIGH (ref 70–99)

## 2022-03-19 MED ORDER — METFORMIN HCL ER 500 MG PO TB24: 500.0000 mg | ORAL_TABLET | Freq: Every day | ORAL | 3 refills | Status: DC

## 2022-03-19 MED ORDER — TRIAMCINOLONE ACETONIDE 0.025 % EX OINT: TOPICAL_OINTMENT | CUTANEOUS | 1 refills | Status: DC

## 2022-03-19 MED ORDER — AMLODIPINE BESYLATE 10 MG PO TABS
10.0000 mg | ORAL_TABLET | Freq: Every day | ORAL | 3 refills | Status: DC
Start: 2022-03-19 — End: 2022-10-20

## 2022-03-19 MED ORDER — METFORMIN HCL ER 500 MG PO TB24: 500.0000 mg | ORAL_TABLET | Freq: Two times a day (BID) | ORAL | 3 refills | Status: DC

## 2022-03-19 NOTE — Patient Instructions (Signed)
For your itchy sensation that you get every couple of weeks please use cortisone 10 cream for this as well as bump stop. Please try to avoid using the hydrocortisone cream more than three times a week. If this does not help please schedule another appointment so we can see the areas that are bothersome.

## 2022-03-20 LAB — BMP8+ANION GAP
Anion Gap: 14 mmol/L (ref 10.0–18.0)
BUN/Creatinine Ratio: 14 (ref 10–24)
BUN: 10 mg/dL (ref 8–27)
CO2: 22 mmol/L (ref 20–29)
Calcium: 9.4 mg/dL (ref 8.6–10.2)
Chloride: 101 mmol/L (ref 96–106)
Creatinine, Ser: 0.71 mg/dL — ABNORMAL LOW (ref 0.76–1.27)
Glucose: 115 mg/dL — ABNORMAL HIGH (ref 70–99)
Potassium: 4.7 mmol/L (ref 3.5–5.2)
Sodium: 137 mmol/L (ref 134–144)
eGFR: 99 mL/min/{1.73_m2} (ref >59)

## 2022-03-20 LAB — LIPID PANEL
Chol/HDL Ratio: 3.6 ratio (ref 0.0–5.0)
Cholesterol, Total: 187 mg/dL (ref 100–199)
HDL: 52 mg/dL (ref >39)
LDL Chol Calc (NIH): 124 mg/dL — ABNORMAL HIGH (ref 0–99)
Triglycerides: 56 mg/dL (ref 0–149)
VLDL Cholesterol Cal: 11 mg/dL (ref 5–40)

## 2022-03-20 NOTE — Progress Notes (Signed)
   CC: F/u chronic medical problems.   HPI:  [name] is a 71 y.o. M w/ PMH per below who presents for follow up of his chronic medical problems. Please see problem based charting under encounters tab for further details.    Past Medical History:  Diagnosis Date   Angiodysplasia of colon with hemorrhage 10/14/2020   Duodenitis    Essential hypertension 07/16/2015   Fatigue 11/06/2020   Glaucoma    H/O: substance abuse ([place])    Hemorrhage of rectum and anus 10/14/2020   History of frostbite    History of prostate cancer 03/20/2014   Lost to f/u with [facility] in the past 2011 with elevated PSA >30 at that time.  Saw [facility] (Dr. Risa Jones) on 03/13/14. Cancer Noted 03/13/14 office visit. Gleason score 7. Plan CT Ab/pelvis with contrast, bone scan    History of radiation therapy nov 2015 to feb 2016   Hyperlipidemia 11/20/2019   Iron deficiency anemia    Malignant tumor of prostate ([place]) 10/14/2020   Peripheral neuropathy    Prostate cancer ([place]) 03/05/14   Gleason 7, volume 45 gm   Rash and nonspecific skin eruption 08/04/2017   Rectal mass 09/15/2016   S/P radiation therapy 09/18/14-11/15/14   prostate/ 7800Gy/40sessions   Thrombocytopenia ([place])    Review of Systems:  Please see problem based charting under encounters tab for further details.    Physical Exam:  Vitals:   03/19/22 0911  BP: 128/88  Pulse: 67  Resp: (!) 28  Temp: 97.9 F (36.6 C)  TempSrc: Oral  SpO2: 100%  Weight: 185 lb 4.8 oz (84.1 kg)  Height: '5\' 9"'$  (1.753 m)   Physical Exam  Constitutional: Well-developed, well-nourished, and in no distress.  HENT:  Head: Normocephalic and atraumatic.  Eyes: EOM are normal.  Neck: Normal range of motion.  Cardiovascular: Normal rate, regular rhythm, intact distal pulses. No gallop and no friction rub.  No murmur heard. No lower extremity edema  Pulmonary: Non labored breathing on room air, no wheezing or rales  Abdominal: Soft. Normal  bowel sounds. Non distended and non tender Musculoskeletal: Normal range of motion.        General: No tenderness or edema.  Neurological: Alert and oriented to person, place, and time. Non focal  Skin: Skin is warm and dry.    Assessment & Plan:   See Encounters Tab for problem based charting.  Patient discussed with Dr.  Saverio Danker

## 2022-03-20 NOTE — Assessment & Plan Note (Signed)
Patient currently on xtandi and receives q45monthinjections for this. He follows regularly [facility]resa [facility]

## 2022-03-20 NOTE — Assessment & Plan Note (Signed)
Patient does not have a rash but continues to get itchy sensation on his lower back and RLE for which he uses triamcinolone cream. Discussed with patient that he should avoid using this cream, particularly on his face. Recommended he try OTC hydrocortisone cream for relief but should avoid using this every day as well.  He was in agreement with this plan.

## 2022-03-20 NOTE — Assessment & Plan Note (Signed)
Lipid Panel     Component Value Date/Time   CHOL 187 03/19/2022 0933   TRIG 56 03/19/2022 0933   HDL 52 03/19/2022 0933   CHOLHDL 3.6 03/19/2022 0933   CHOLHDL 3.9 Ratio 12/16/2009 2119   VLDL 22 12/16/2009 2119   LDLCALC 124 (H) 03/19/2022 0933   LABVLDL 11 03/19/2022 0933   Patient prescribed atorvastatin. Did not bring this medication with him this clinic visit. Will call to make sure he has statin. His ASCVD risk score is 31.2%.

## 2022-03-20 NOTE — Assessment & Plan Note (Signed)
    03/19/2022    9:11 AM 01/15/2022    9:51 AM 11/25/2021   10:02 AM  Vitals with BMI  Height '5\' 9"'$  '5\' 9"'$  '5\' 9"'$   Weight 185 lbs 5 oz 187 lbs 14 oz 190 lbs  BMI 27.35 60.15 61.53  Systolic 794 327 614  Diastolic 88 86 90  Pulse 67 81 80    Continue amlodipine '10mg'$  daily, states he is currently not taking HCTz.  BMP WNL

## 2022-03-20 NOTE — Assessment & Plan Note (Signed)
A1c is 6.9 up from 6.7 last clinic visit. He is currently on metformin '500mg'$  daily. Will increase this to '500mg'$  BID.  -Patient will follow up in 6 months.

## 2022-03-21 NOTE — Progress Notes (Signed)
Internal Medicine Clinic Attending  Case discussed with Dr. Carter  At the time of the visit.  We reviewed the resident's history and exam and pertinent patient test results.  I agree with the assessment, diagnosis, and plan of care documented in the resident's note.  

## 2022-03-25 ENCOUNTER — Ambulatory Visit: Payer: Medicare Other

## 2022-10-19 NOTE — Progress Notes (Signed)
Subjective:  CC: Follow up  HPI:  [name] is a 72 y.o. male with a past medical history stated below and presents today for blood pressure and diabetes follow up. Please see problem based assessment and plan for additional details.  Past Medical History:  Diagnosis Date   Angiodysplasia of colon with hemorrhage 10/14/2020   Duodenitis    Essential hypertension 07/16/2015   Fatigue 11/06/2020   Glaucoma    H/O: substance abuse ([place])    Hemorrhage of rectum and anus 10/14/2020   History of frostbite    History of prostate cancer 03/20/2014   Lost to f/u with [facility] in the past 2011 with elevated PSA >30 at that time.  Saw [facility] (Dr. Risa Grill) on 03/13/14. Cancer Noted 03/13/14 office visit. Gleason score 7. Plan CT Ab/pelvis with contrast, bone scan    History of radiation therapy nov 2015 to feb 2016   Hyperlipidemia 11/20/2019   Iron deficiency anemia    Malignant tumor of prostate ([place]) 10/14/2020   Peripheral neuropathy    Prostate cancer ([place]) 03/05/14   Gleason 7, volume 45 gm   Rash and nonspecific skin eruption 08/04/2017   Rectal mass 09/15/2016   S/P radiation therapy 09/18/14-11/15/14   prostate/ 7800Gy/40sessions   Thrombocytopenia ([place])     Current Outpatient Medications on File Prior to Visit  Medication Sig Dispense Refill   acetaminophen (TYLENOL) 500 MG tablet Take 2 tablets (1,000 mg total) by mouth every 6 (six) hours as needed for mild pain or moderate pain. 30 tablet 0   BETIMOL 0.5 % ophthalmic solution INT 1 GTT IN OD BID  5   brimonidine (ALPHAGAN) 0.2 % ophthalmic solution INT 1 GTT INTO THE RIGHT EYE BID  0   CALCIUM CITRATE PO Take 600 mg by mouth in the morning and at bedtime.     cholecalciferol (VITAMIN D3) 10 MCG (400 UNIT) TABS tablet Take 1,000 Units by mouth daily.     dorzolamide (TRUSOPT) 2 % ophthalmic solution INT 1 GTT IN OD BID     Multiple Vitamins-Minerals (ONE-A-DAY PROACTIVE 65+) TABS Take 1 tablet by  mouth daily.     ofloxacin (OCUFLOX) 0.3 % ophthalmic solution      prednisoLONE acetate (PRED FORTE) 1 % ophthalmic suspension      sildenafil (VIAGRA) 100 MG tablet      timolol (TIMOPTIC) 0.5 % ophthalmic solution PLACE ONE DROP 2 TIMES INTO THE RIGHT EYE UTD  7   vitamin E 180 MG (400 UNITS) capsule Take 400 Units by mouth daily.     XTANDI 40 MG capsule Take 160 mg by mouth daily.     No current facility-administered medications on file prior to visit.    Family History  Problem Relation Age of Onset   Kidney failure Brother 70       has been on HD since age 19    Edema Mother        Legs   Arthritis Sister        knees   Stroke Maternal Uncle        3s-80s   Cancer Neg Hx     Social History   Socioeconomic History   Marital status: Widowed    Spouse name: William Jones   Number of children: 2   Years of education: Not on file   Highest education level: Not on file  Occupational History   Occupation: Disabled  Tobacco Use   Smoking status: Former  Packs/day: 0.50    Years: 30.00    Total pack years: 15.00    Types: Cigarettes    Quit date: 10/05/2007    Years since quitting: 15.0   Smokeless tobacco: Never  Vaping Use   Vaping Use: Never used  Substance and Sexual Activity   Alcohol use: Not Currently    Alcohol/week: 0.0 standard drinks of alcohol    Comment: no alcohol for five years (per conversation 2020)   Drug use: No    Types: "Crack" cocaine, Marijuana    Comment: per Epic note 2009hx of crack cocaine use 12 years ago per pt, no current marijuana use per pt   Sexual activity: Yes  Other Topics Concern   Not on file  Social History Narrative   On disability - frostbite. Used to work for [employer]. Retired in 1990. Has 2 kids. 6th grade. Resides with significant other.      Current Social History 06/08/2019        Patient lives with a significant other in a/an apartment which is 1 story/stories. There are steps up to the entrance the  patient uses.       Patient's method of transportation is personal car.      The highest level of education was some high school.      The patient currently disabled.      Identified important Relationships are Scientist, water quality (Fiance) and William Jones (Daughter)       Pets : None       Interests / Fun: "Going to the park and walking around. Go visit people that we know."       Current Stressors: "I don't have any"       Religious / Personal Beliefs: "Holiness"       L. Silvano Rusk, RN, BSN       Social Determinants of Health   Financial Resource Strain: Low Risk  (06/25/2020)   Overall Financial Resource Strain (CARDIA)    Difficulty of Paying Living Expenses: Not hard at all  Food Insecurity: No Food Insecurity (06/25/2020)   Hunger Vital Sign    Worried About Running Out of Food in the Last Year: Never true    [place] in the Last Year: Never true  Transportation Needs: No Transportation Needs (06/25/2020)   PRAPARE - Hydrologist (Medical): No    Lack of Transportation (Non-Medical): No  Physical Activity: Sufficiently Active (06/25/2020)   Exercise Vital Sign    Days of Exercise per Week: 5 days    Minutes of Exercise per Session: 150+ min  Stress: No Stress Concern Present (06/25/2020)   [place]    Feeling of Stress : Not at all  Social Connections: Moderately Integrated (06/25/2020)   Social Connection and Isolation Panel [NHANES]    Frequency of Communication with Friends and Family: More than three times a week    Frequency of Social Gatherings with Friends and Family: Twice a week    Attends Religious Services: More than 4 times per year    Active Member of [employer] or Organizations: No    Attends Archivist Meetings: Never    Marital Status: Living with partner  Intimate Partner Violence: Not At Risk (06/25/2020)   Humiliation, Afraid, Rape, and Kick  questionnaire    Fear of Current or Ex-Partner: No    Emotionally Abused: No    Physically Abused: No  Sexually Abused: No    Review of Systems: ROS negative except for what is noted on the assessment and plan.  Objective:   Vitals:   10/20/22 0911 10/20/22 0916  BP: (!) 155/92 139/88  Pulse: 60 60  Resp: (!) 32   Temp: 97.7 F (36.5 C)   TempSrc: Oral   SpO2: 100%   Weight: 192 lb 12.8 oz (87.5 kg)   Height: '5\' 9"'$  (1.753 m)     Physical Exam: Constitutional: well-appearing man sitting in chair, in no acute distress HENT: normocephalic atraumatic, non pale conjuctiva,  mucous membranes moist Eyes: conjunctiva non-erythematous. Neck: supple Cardiovascular: regular rate and rhythm, no m/r/g Pulmonary/Chest: normal work of breathing on room air, lungs clear to auscultation bilaterally Abdominal: soft, non-tender, non-distended MSK: normal bulk and tone Neurological: alert & oriented x 3, 5/5 strength in bilateral upper and lower extremities, normal gait Skin: warm and dry, no brittle nails Psych: pleasant mood and affect     Assessment & Plan:   Essential hypertension Currently taking amlodipine 10 mg. Previously on HCTZ but self discontinued. Patient with BP wnl in 03/2022. Patient had been taking 10 mg of amlodipine before, but ran out and has been taking amlodipine 5 mg most days. He did not take it this morning. Patient has increased vegetables and whole fruits, and decreased carb, which has helped him  Patient denies HA, vision changes, anginal episodes, or DOE.   Vitals:   10/20/22 0911 10/20/22 0916  BP: (!) 155/92 139/88  Patient returns with mildly elevated BP today. Discussed importance of everyday medications and keeping a log of them. Do not deem necessary to alter current regimen. Re-ordered amlodipine '10mg'$  -Blood pressure log -Reassess in 3 months  Type 2 diabetes mellitus ([place]) A1c 03/2022 6.9. Metformin was increased to 500 mg BID. Patient has  improved diet and incorporates movement daily. Patient takes metformin intermittently - some days twice a day,  sometimes once, some days none. Has not had hypoglycemic episodes. Does not report catabolic sxs today.  A1c today 6.7; per patient's report, this is likely mostly diet controlled. Will continue on Meformin for now. Patient is due for Urine microalbumin/Cr ration; if abnormal, would consider stopping or decreasing Metformin dose and adding SGLT2i  -Continue lifestyle modifications -A1c today  Prostate cancer metastatic to bone [facility]) Followed by Dr. Tammi Klippel at [facility], seen monthly. Last appointment in January. Patient continues to receive Xgeva  monthly through clinic. Taking Xtandi - androgen inhibitor. No changes in urination, pain in back, hip, or pelvis. His energy levels continue to be high. No complaints at this time -Continue Gillermina Phy and Delton See per urology  Iron deficiency anemia Last CBC with hgb 11.8 and with iron studies wnl after therapy with PO iron for IDA. Patient was to stop iron supplementation 6 months ago, but he continued therapy. He has been on PO iron for 3 years. He denies fatigue, lightheadedness, changes in skin, temperature intolerance, or cravings for ice, which he had endorsed in the past when found iron deficient. Denies constipation at this time.  Discussed previous plan to stop iron supplementation with patient. No symptom concern for iron deficiency anemia or overt secondary iron overload. Will order CBC and iron studies to monitor. IF appropriated repleted, will stop po iron supplementation and check again in 6 months -f/u CBC -f/u iron studies  Healthcare maintenance Flu vaccine today    Patient discussed with Dr. Evette Doffing

## 2022-10-20 ENCOUNTER — Other Ambulatory Visit: Payer: Self-pay

## 2022-10-20 ENCOUNTER — Ambulatory Visit (INDEPENDENT_AMBULATORY_CARE_PROVIDER_SITE_OTHER): Payer: 59 | Admitting: *Deleted

## 2022-10-20 ENCOUNTER — Ambulatory Visit (INDEPENDENT_AMBULATORY_CARE_PROVIDER_SITE_OTHER): Payer: 59 | Admitting: Student

## 2022-10-20 ENCOUNTER — Encounter: Payer: Self-pay | Admitting: Student

## 2022-10-20 VITALS — BP 139/88 | HR 60 | Temp 97.7°F | Wt 192.9 lb

## 2022-10-20 VITALS — BP 139/88 | HR 60 | Temp 97.7°F | Resp 32 | Ht 69.0 in | Wt 192.8 lb

## 2022-10-20 DIAGNOSIS — Z23 Encounter for immunization: Secondary | ICD-10-CM

## 2022-10-20 DIAGNOSIS — Z7984 Long term (current) use of oral hypoglycemic drugs: Secondary | ICD-10-CM

## 2022-10-20 DIAGNOSIS — Z87891 Personal history of nicotine dependence: Secondary | ICD-10-CM

## 2022-10-20 DIAGNOSIS — E119 Type 2 diabetes mellitus without complications: Secondary | ICD-10-CM

## 2022-10-20 DIAGNOSIS — I10 Essential (primary) hypertension: Secondary | ICD-10-CM

## 2022-10-20 DIAGNOSIS — Z0000 Encounter for general adult medical examination without abnormal findings: Secondary | ICD-10-CM | POA: Diagnosis not present

## 2022-10-20 DIAGNOSIS — C7951 Secondary malignant neoplasm of bone: Secondary | ICD-10-CM

## 2022-10-20 DIAGNOSIS — D509 Iron deficiency anemia, unspecified: Secondary | ICD-10-CM | POA: Diagnosis not present

## 2022-10-20 DIAGNOSIS — C61 Malignant neoplasm of prostate: Secondary | ICD-10-CM

## 2022-10-20 LAB — POCT GLYCOSYLATED HEMOGLOBIN (HGB A1C): Hemoglobin A1C: 6.7 % — AB (ref 4.0–5.6)

## 2022-10-20 LAB — GLUCOSE, CAPILLARY: Glucose-Capillary: 116 mg/dL — ABNORMAL HIGH (ref 70–99)

## 2022-10-20 MED ORDER — METFORMIN HCL ER 500 MG PO TB24: 500.0000 mg | ORAL_TABLET | Freq: Two times a day (BID) | ORAL | 3 refills | Status: DC

## 2022-10-20 MED ORDER — AMLODIPINE BESYLATE 10 MG PO TABS: 10.0000 mg | ORAL_TABLET | Freq: Every day | ORAL | 3 refills | Status: DC

## 2022-10-20 NOTE — Progress Notes (Signed)
Subjective:   William Jones is a 72 y.o. male who presents for Medicare Annual/Subsequent preventive examination.  Review of Systems    Defer to pcp        Objective:    Today's Vitals   10/20/22 0916  BP: 139/88  Pulse: 60  Temp: 97.7 F (36.5 C)  TempSrc: Oral  Weight: 192 lb 14.4 oz (87.5 kg)   Body mass index is 28.49 kg/m.     10/20/2022    9:16 AM 10/20/2022    9:11 AM 03/19/2022    9:10 AM 01/15/2022    9:54 AM 11/25/2021   10:02 AM 05/01/2021    5:48 PM 11/25/2020    9:02 AM  Advanced Directives  Does Patient Have a Medical Advance Directive? No No No No No No No  Would patient like information on creating a medical advance directive? No - Patient declined No - Patient declined No - Patient declined No - Patient declined No - Guardian declined No - Patient declined No - Patient declined    Current Medications (verified) Outpatient Encounter Medications as of 10/20/2022  Medication Sig   acetaminophen (TYLENOL) 500 MG tablet Take 2 tablets (1,000 mg total) by mouth every 6 (six) hours as needed for mild pain or moderate pain.   amLODipine (NORVASC) 10 MG tablet Take 1 tablet (10 mg total) by mouth daily.   BETIMOL 0.5 % ophthalmic solution INT 1 GTT IN OD BID   brimonidine (ALPHAGAN) 0.2 % ophthalmic solution INT 1 GTT INTO THE RIGHT EYE BID   CALCIUM CITRATE PO Take 600 mg by mouth in the morning and at bedtime.   cholecalciferol (VITAMIN D3) 10 MCG (400 UNIT) TABS tablet Take 1,000 Units by mouth daily.   dorzolamide (TRUSOPT) 2 % ophthalmic solution INT 1 GTT IN OD BID   metFORMIN (GLUCOPHAGE-XR) 500 MG 24 hr tablet Take 1 tablet (500 mg total) by mouth 2 (two) times daily with a meal.   Multiple Vitamins-Minerals (ONE-A-DAY PROACTIVE 65+) TABS Take 1 tablet by mouth daily.   ofloxacin (OCUFLOX) 0.3 % ophthalmic solution    prednisoLONE acetate (PRED FORTE) 1 % ophthalmic suspension    sildenafil (VIAGRA) 100 MG tablet    timolol (TIMOPTIC) 0.5 %  ophthalmic solution PLACE ONE DROP 2 TIMES INTO THE RIGHT EYE UTD   vitamin E 180 MG (400 UNITS) capsule Take 400 Units by mouth daily.   XTANDI 40 MG capsule Take 160 mg by mouth daily.   No facility-administered encounter medications on file as of 10/20/2022.    Allergies (verified) Patient has no known allergies.   History: Past Medical History:  Diagnosis Date   Angiodysplasia of colon with hemorrhage 10/14/2020   Duodenitis    Essential hypertension 07/16/2015   Fatigue 11/06/2020   Glaucoma    H/O: substance abuse ([place])    Hemorrhage of rectum and anus 10/14/2020   History of frostbite    History of prostate cancer 03/20/2014   Lost to f/u with [facility] in the past 2011 with elevated PSA >30 at that time.  Saw [facility] (Dr. Risa Grill) on 03/13/14. Cancer Noted 03/13/14 office visit. Gleason score 7. Plan CT Ab/pelvis with contrast, bone scan    History of radiation therapy nov 2015 to feb 2016   Hyperlipidemia 11/20/2019   Iron deficiency anemia    Malignant tumor of prostate ([place]) 10/14/2020   Peripheral neuropathy    Prostate cancer ([place]) 03/05/14   Gleason 7, volume 45 gm  Rash and nonspecific skin eruption 08/04/2017   Rectal mass 09/15/2016   S/P radiation therapy 09/18/14-11/15/14   prostate/ 7800Gy/40sessions   Thrombocytopenia [facility])    Past Surgical History:  Procedure Laterality Date   COLONOSCOPY N/A 01/10/2014   Procedure: COLONOSCOPY;  Surgeon: [name], MD;  Location: [place];  Service: Endoscopy;  Laterality: N/A;   ESOPHAGOGASTRODUODENOSCOPY N/A 01/10/2014   Procedure: ESOPHAGOGASTRODUODENOSCOPY (EGD);  Surgeon: [name], MD;  Location: [facility] ENDOSCOPY;  Service: Endoscopy;  Laterality: N/A;   FLEXIBLE SIGMOIDOSCOPY N/A 09/19/2015   Procedure: FLEXIBLE SIGMOIDOSCOPY;  Surgeon: Carol Ada, MD;  Location: [facility] ENDOSCOPY;  Service: Endoscopy;  Laterality: N/A;   FLEXIBLE SIGMOIDOSCOPY N/A 06/03/2017   Procedure: FLEXIBLE SIGMOIDOSCOPY;  Surgeon:  Carol Ada, MD;  Location: [facility] ENDOSCOPY;  Service: Endoscopy;  Laterality: N/A;   FLEXIBLE SIGMOIDOSCOPY N/A 01/06/2018   Procedure: FLEXIBLE SIGMOIDOSCOPY;  Surgeon: Carol Ada, MD;  Location: [facility] ENDOSCOPY;  Service: Endoscopy;  Laterality: N/A;   GIVENS CAPSULE STUDY N/A 01/10/2014   Procedure: GIVENS CAPSULE STUDY;  Surgeon: [name], MD;  Location: [place];  Service: Endoscopy;  Laterality: N/A;   HOT HEMOSTASIS N/A 09/19/2015   Procedure: HOT HEMOSTASIS (ARGON PLASMA COAGULATION/BICAP);  Surgeon: Carol Ada, MD;  Location: [facility];  Service: Endoscopy;  Laterality: N/A;   HOT HEMOSTASIS N/A 06/03/2017   Procedure: HOT HEMOSTASIS (ARGON PLASMA COAGULATION/BICAP);  Surgeon: Carol Ada, MD;  Location: [facility];  Service: Endoscopy;  Laterality: N/A;   PROSTATE BIOPSY  03/05/14   Gleason 7, vol 45 gm   Family History  Problem Relation Age of Onset   Kidney failure Brother 77       has been on HD since age 51    Edema Mother        Legs   Arthritis Sister        knees   Stroke Maternal Uncle        70s-80s   Cancer Neg Hx    Social History   Socioeconomic History   Marital status: Widowed    Spouse name: Joyce Copa   Number of children: 2   Years of education: Not on file   Highest education level: Not on file  Occupational History   Occupation: Disabled  Tobacco Use   Smoking status: Former    Packs/day: 0.50    Years: 30.00    Total pack years: 15.00    Types: Cigarettes    Quit date: 10/05/2007    Years since quitting: 15.0   Smokeless tobacco: Never  Vaping Use   Vaping Use: Never used  Substance and Sexual Activity   Alcohol use: Not Currently    Alcohol/week: 0.0 standard drinks of alcohol    Comment: no alcohol for five years (per conversation 2020)   Drug use: No    Types: "Crack" cocaine, Marijuana    Comment: per Epic note 2009hx of crack cocaine use 12 years ago per pt, no current marijuana use per pt   Sexual activity: Yes   Other Topics Concern   Not on file  Social History Narrative   On disability - frostbite. Used to work for [employer]. Retired in 1990. Has 2 kids. 6th grade. Resides with significant other.      Current Social History 06/08/2019        Patient lives with a significant other in a/an apartment which is 1 story/stories. There are steps up to the entrance the patient uses.       Patient's  method of transportation is personal car.      The highest level of education was some high school.      The patient currently disabled.      Identified important Relationships are Scientist, water quality (Fiance) and Agnes Lawrence (Daughter)       Pets : None       Interests / Fun: "Going to the park and walking around. Go visit people that we know."       Current Stressors: "I don't have any"       Religious / Personal Beliefs: "Holiness"       L. Silvano Rusk, RN, BSN       Social Determinants of Health   Financial Resource Strain: Low Risk  (06/25/2020)   Overall Financial Resource Strain (CARDIA)    Difficulty of Paying Living Expenses: Not hard at all  Food Insecurity: No Food Insecurity (10/20/2022)   Hunger Vital Sign    Worried About Running Out of Food in the Last Year: Never true    [place] in the Last Year: Never true  Transportation Needs: No Transportation Needs (10/20/2022)   PRAPARE - Hydrologist (Medical): No    Lack of Transportation (Non-Medical): No  Physical Activity: Sufficiently Active (06/25/2020)   Exercise Vital Sign    Days of Exercise per Week: 5 days    Minutes of Exercise per Session: 150+ min  Stress: No Stress Concern Present (10/20/2022)   [place]    Feeling of Stress : Not at all  Social Connections: Moderately Integrated (06/25/2020)   Social Connection and Isolation Panel [NHANES]    Frequency of Communication with Friends and Family: More than three times a  week    Frequency of Social Gatherings with Friends and Family: Twice a week    Attends Religious Services: More than 4 times per year    Active Member of [employer] or Organizations: No    Attends Archivist Meetings: Never    Marital Status: Living with partner    Tobacco Counseling Counseling given: Not Answered   Clinical Intake:                 Diabetic?yes          Activities of Daily Living    10/20/2022    4:35 PM 10/20/2022    9:10 AM  In your present state of health, do you have any difficulty performing the following activities:  Hearing? 0 0  Vision? 0 0  Difficulty concentrating or making decisions? 0 0  Walking or climbing stairs? 0 0  Dressing or bathing? 0 0  Doing errands, shopping? 0 0    Patient Care Team: Romana Juniper, MD as PCP - General Lovena Neighbours Conception Oms, MD as Consulting Physician (Urology) Tyler Pita, MD as Consulting Physician (Radiation Oncology) Alanda Slim, Neena Rhymes, MD as Consulting Physician (Ophthalmology) Edrick Kins, DPM as Consulting Physician (Podiatry)  Indicate any recent Medical Services you may have received from other than [facility] providers in the past year (date may be approximate).     Assessment:   This is a routine wellness examination for [place].  Hearing/Vision screen No results found.  Dietary issues and exercise activities discussed:     Goals Addressed   None   Depression Screen    10/20/2022    9:15 AM 03/19/2022    9:16 AM 03/19/2022    9:11 AM 01/15/2022  10:53 AM 11/25/2021   10:02 AM 11/25/2020    9:02 AM 06/19/2020    9:02 AM  PHQ 2/9 Scores  PHQ - 2 Score 0 0 0 0 0 0 0  PHQ- 9 Score 0   0 0 1     Fall Risk    10/20/2022    4:35 PM 10/20/2022    9:10 AM 03/19/2022    9:11 AM 01/15/2022    9:53 AM 11/25/2021   10:01 AM  Fall Risk   Falls in the past year? 0 0 0 0 0  Number falls in past yr: 0 0 0 0 0  Injury with Fall? 0 0 0 0 0  Risk for fall due to :  History of fall(s) No Fall Risks No Fall Risks  No Fall Risks  Follow up Falls evaluation completed Falls evaluation completed;Falls prevention discussed Falls evaluation completed;Falls prevention discussed Falls evaluation completed Falls evaluation completed;Falls prevention discussed    FALL RISK PREVENTION PERTAINING TO THE HOME:  Any stairs in or around the home? No  If so, are there any without handrails? No  Home free of loose throw rugs in walkways, pet beds, electrical cords, etc? No  Adequate lighting in your home to reduce risk of falls? Yes   ASSISTIVE DEVICES UTILIZED TO PREVENT FALLS:  Life alert? No  Use of a cane, walker or w/c? No  Grab bars in the bathroom? Yes  Shower chair or bench in shower? No  Elevated toilet seat or a handicapped toilet? No   TIMED UP AND GO:  Was the test performed? No .  Length of time to ambulate 10 feet: 0 sec.    Cognitive Function:        Immunizations Immunization History  Administered Date(s) Administered   Fluad Quad(high Dose 65+) 07/03/2021, 10/20/2022   Influenza,inj,Quad PF,6+ Mos 07/09/2017, 08/21/2018, 06/05/2019, 06/03/2020   PFIZER(Purple Top)SARS-COV-2 Vaccination 12/09/2019, 01/09/2020   PNEUMOCOCCAL CONJUGATE-20 11/25/2021   Pneumococcal Conjugate-13 08/28/2018   Tdap 05/17/2017   Zoster Recombinat (Shingrix) 09/11/2018, 12/02/2018    TDAP status: Up to date  Flu Vaccine status: Up to date  Pneumococcal vaccine status: Up to date  Covid-19 vaccine status: Completed vaccines  Qualifies for Shingles Vaccine? Yes   Zostavax completed No   Shingrix Completed?: Yes  Screening Tests Health Maintenance  Topic Date Due   COVID-19 Vaccine (3 - Pfizer risk series) 02/06/2020   OPHTHALMOLOGY EXAM  09/17/2021   Diabetic kidney evaluation - Urine ACR  07/07/2022   Diabetic kidney evaluation - eGFR measurement  03/20/2023   HEMOGLOBIN A1C  04/20/2023   Medicare Annual Wellness (AWV)  10/21/2023    COLONOSCOPY (Pts 45-54yr Insurance coverage will need to be confirmed)  01/11/2024   DTaP/Tdap/Td (2 - Td or Tdap) 05/18/2027   Pneumonia Vaccine 70 Years old  Completed   INFLUENZA VACCINE  Completed   Hepatitis C Screening  Completed   Zoster Vaccines- Shingrix  Completed   HPV VACCINES  Aged Out   FOOT EXAM  Discontinued    Health Maintenance  Health Maintenance Due  Topic Date Due   COVID-19 Vaccine (3 - Pfizer risk series) 02/06/2020   OPHTHALMOLOGY EXAM  09/17/2021   Diabetic kidney evaluation - Urine ACR  07/07/2022    Colorectal cancer screening: Type of screening: Colonoscopy. Completed 01/10/2014. Repeat every 10 years  Lung Cancer Screening: (Low Dose CT Chest recommended if Age 957-80years, 30 pack-year currently smoking OR have quit w/in 15years.) does not qualify.  Lung Cancer Screening Referral: n/a  Additional Screening:  Hepatitis C Screening: does not qualify; Completed 01/09/2014  Vision Screening: Recommended annual ophthalmology exams for early detection of glaucoma and other disorders of the eye. Is the patient up to date with their annual eye exam?  Yes  Who is the provider or what is the name of the office in which the patient attends annual eye exams? unknown If pt is not established with a provider, would they like to be referred to a provider to establish care?  Already established  Dental Screening: Recommended annual dental exams for proper oral hygiene  Community Resource Referral / Chronic Care Management: CRR required this visit?  No   CCM required this visit?  No      Plan:     I have personally reviewed and noted the following in the patient's chart:   Medical and social history Use of alcohol, tobacco or illicit drugs  Current medications and supplements including opioid prescriptions. Patient is not currently taking opioid prescriptions. Functional ability and status Nutritional status Physical activity Advanced  directives List of other physicians Hospitalizations, surgeries, and ER visits in previous 12 months Vitals Screenings to include cognitive, depression, and falls Referrals and appointments  In addition, I have reviewed and discussed with patient certain preventive protocols, quality metrics, and best practice recommendations. A written personalized care plan for preventive services as well as general preventive health recommendations were provided to patient.     Nicoletta Dress, [place]   10/20/2022   Nurse Notes face to face   Mr. [place] , Thank you for taking time to come for your Medicare Wellness Visit. I appreciate your ongoing commitment to your health goals. Please review the following plan we discussed and let me know if I can assist you in the future.   These are the goals we discussed:  Goals      Manage My Cholesterol     DUPLICATE GOAL Timeframe:  Long-Range Goal Priority:  Medium Start Date:  06/25/20                           Expected End Date:    ongoing                   Follow Up Date 08/02/21   - change to whole grain breads, cereal, pasta - eat smaller or less servings of red meat - fill half the plate with nonstarchy vegetables - get blood test (fasting) done 1 week before next visit - increase the amount of fiber in food - read food labels for fat and fiber    Why is this important?   Changing cholesterol starts with eating heart-healthy foods.  Other steps may be to increase your activity and to quit if you smoke.    Notes:        This is a list of the screening recommended for you and due dates:  Health Maintenance  Topic Date Due   COVID-19 Vaccine (3 - Pfizer risk series) 02/06/2020   Eye exam for diabetics  09/17/2021   Yearly kidney health urinalysis for diabetes  07/07/2022   Yearly kidney function blood test for diabetes  03/20/2023   Hemoglobin A1C  04/20/2023   Medicare Annual Wellness Visit  10/21/2023   Colon Cancer  Screening  01/11/2024   DTaP/Tdap/Td vaccine (2 - Td or Tdap) 05/18/2027   Pneumonia Vaccine  Completed   Flu Shot  Completed   Hepatitis C Screening: USPSTF Recommendation to screen - Ages 64-79 yo.  Completed   Zoster (Shingles) Vaccine  Completed   HPV Vaccine  Aged Out   Complete foot exam   Discontinued

## 2022-10-20 NOTE — Assessment & Plan Note (Signed)
Flu vaccine today

## 2022-10-20 NOTE — Assessment & Plan Note (Addendum)
Currently taking amlodipine 10 mg. Previously on HCTZ but self discontinued. Patient with BP wnl in 03/2022. Patient had been taking 10 mg of amlodipine before, but ran out and has been taking amlodipine 5 mg most days. He did not take it this morning. Patient has increased vegetables and whole fruits, and decreased carb, which has helped him  Patient denies HA, vision changes, anginal episodes, or DOE.   Vitals:   10/20/22 0911 10/20/22 0916  BP: (!) 155/92 139/88  Patient returns with mildly elevated BP today. Discussed importance of everyday medications and keeping a log of them. Do not deem necessary to alter current regimen. Re-ordered amlodipine '10mg'$  -Blood pressure log -Reassess in 3 months

## 2022-10-20 NOTE — Patient Instructions (Addendum)
Thank you, [place] for allowing [place] to provide your care today. Today we discussed   Blood pressure, your diet, diabetes, your prostate cancer treatment, and your iron supplementation  Blood pressure, pick up and keep taking amlodipine 10 mg daily. I also want you to keep a log of your blood pressure daily  Diabetes - we are going to wait labs show. But I want you to continue taking Metfomin twice a day with meal  Iron - I am checking your blood counts today and your iron. If it is still low, I want you to continue taking the iron at home. If it is okay, we can stop the iron pills   I have ordered the following labs for you:   Lab Orders         CBC no Diff         Microalbumin / Creatinine Urine Ratio         POC Hbg A1C      I will call if any are abnormal. All of your labs can be accessed through "My Chart".    My Chart Access: [email or web address]  Please follow-up in   Please make sure to arrive 15 minutes prior to your next appointment. If you arrive late, you may be asked to reschedule.    We look forward to seeing you next time. Please call our clinic at [phone] if you have any questions or concerns. The best time to call is Monday-Friday from 9am-4pm, but there is someone available 24/7. If after hours or the weekend, call the main hospital number and ask for the Internal Medicine Resident On-Call. If you need medication refills, please notify your pharmacy one week in advance and they will send [place] a request.   Thank you for letting us take part in your care. Wishing you the best!  Romana Juniper, MD 10/20/2022, 9:22 AM [facility] Internal Medicine Resident, PGY-1    Blood pressure tips   It is best to check your BP 1-2 hours after taking your medications to see the medications effectiveness on your BP.    Here are some tips that our clinical pharmacists share for home BP monitoring:           Rest 10 minutes before taking your blood pressure.          Don't smoke or drink caffeinated beverages for at least 30 minutes before.          Take your blood pressure before (not after) you eat.          Sit comfortably with your back supported and both feet on the floor (don't cross your legs).          Elevate your arm to heart level on a table or a desk.          Use the proper sized cuff. It should fit smoothly and snugly around your bare upper arm. There should be enough room to slip a fingertip under the cuff. The bottom edge of the cuff should be 1 inch above the crease of the elbow.

## 2022-10-20 NOTE — Assessment & Plan Note (Signed)
Followed by Dr. Tammi Klippel at [facility], seen monthly. Last appointment in January. Patient continues to receive Xgeva  monthly through clinic. Taking Xtandi - androgen inhibitor. No changes in urination, pain in back, hip, or pelvis. His energy levels continue to be high. No complaints at this time -Continue Gillermina Phy and Delton See per urology

## 2022-10-20 NOTE — Assessment & Plan Note (Signed)
Last CBC with hgb 11.8 and with iron studies wnl after therapy with PO iron for IDA. Patient was to stop iron supplementation 6 months ago, but he continued therapy. He has been on PO iron for 3 years. He denies fatigue, lightheadedness, changes in skin, temperature intolerance, or cravings for ice, which he had endorsed in the past when found iron deficient. Denies constipation at this time.  Discussed previous plan to stop iron supplementation with patient. No symptom concern for iron deficiency anemia or overt secondary iron overload. Will order CBC and iron studies to monitor. IF appropriated repleted, will stop po iron supplementation and check again in 6 months -f/u CBC -f/u iron studies

## 2022-10-20 NOTE — Assessment & Plan Note (Signed)
A1c 03/2022 6.9. Metformin was increased to 500 mg BID. Patient has improved diet and incorporates movement daily. Patient takes metformin intermittently - some days twice a day,  sometimes once, some days none. Has not had hypoglycemic episodes. Does not report catabolic sxs today.  A1c today 6.7; per patient's report, this is likely mostly diet controlled. Will continue on Meformin for now. Patient is due for Urine microalbumin/Cr ration; if abnormal, would consider stopping or decreasing Metformin dose and adding SGLT2i  -Continue lifestyle modifications -A1c today

## 2022-10-20 NOTE — Patient Instructions (Signed)
Health Maintenance, Male Adopting a healthy lifestyle and getting preventive care are important in promoting health and wellness. Ask your health care provider about: The right schedule for you to have regular tests and exams. Things you can do on your own to prevent diseases and keep yourself healthy. What should I know about diet, weight, and exercise? Eat a healthy diet  Eat a diet that includes plenty of vegetables, fruits, low-fat dairy products, and lean protein. Do not eat a lot of foods that are high in solid fats, added sugars, or sodium. Maintain a healthy weight Body mass index (BMI) is a measurement that can be used to identify possible weight problems. It estimates body fat based on height and weight. Your health care provider can help determine your BMI and help you achieve or maintain a healthy weight. Get regular exercise Get regular exercise. This is one of the most important things you can do for your health. Most adults should: Exercise for at least 150 minutes each week. The exercise should increase your heart rate and make you sweat (moderate-intensity exercise). Do strengthening exercises at least twice a week. This is in addition to the moderate-intensity exercise. Spend less time sitting. Even light physical activity can be beneficial. Watch cholesterol and blood lipids Have your blood tested for lipids and cholesterol at 70 years of age, then have this test every 5 years. You may need to have your cholesterol levels checked more often if: Your lipid or cholesterol levels are high. You are older than 70 years of age. You are at high risk for heart disease. What should I know about cancer screening? Many types of cancers can be detected early and may often be prevented. Depending on your health history and family history, you may need to have cancer screening at various ages. This may include screening for: Colorectal cancer. Prostate cancer. Skin cancer. Lung  cancer. What should I know about heart disease, diabetes, and high blood pressure? Blood pressure and heart disease High blood pressure causes heart disease and increases the risk of stroke. This is more likely to develop in people who have high blood pressure readings or are overweight. Talk with your health care provider about your target blood pressure readings. Have your blood pressure checked: Every 3-5 years if you are 18-39 years of age. Every year if you are 40 years old or older. If you are between the ages of 65 and 75 and are a current or former smoker, ask your health care provider if you should have a one-time screening for abdominal aortic aneurysm (AAA). Diabetes Have regular diabetes screenings. This checks your fasting blood sugar level. Have the screening done: Once every three years after age 45 if you are at a normal weight and have a low risk for diabetes. More often and at a younger age if you are overweight or have a high risk for diabetes. What should I know about preventing infection? Hepatitis B If you have a higher risk for hepatitis B, you should be screened for this virus. Talk with your health care provider to find out if you are at risk for hepatitis B infection. Hepatitis C Blood testing is recommended for: Everyone born from 1945 through 1965. Anyone with known risk factors for hepatitis C. Sexually transmitted infections (STIs) You should be screened each year for STIs, including gonorrhea and chlamydia, if: You are sexually active and are younger than 70 years of age. You are older than 70 years of age and your   health care provider tells you that you are at risk for this type of infection. Your sexual activity has changed since you were last screened, and you are at increased risk for chlamydia or gonorrhea. Ask your health care provider if you are at risk. Ask your health care provider about whether you are at high risk for HIV. Your health care provider  may recommend a prescription medicine to help prevent HIV infection. If you choose to take medicine to prevent HIV, you should first get tested for HIV. You should then be tested every 3 months for as long as you are taking the medicine. Follow these instructions at home: Alcohol use Do not drink alcohol if your health care provider tells you not to drink. If you drink alcohol: Limit how much you have to 0-2 drinks a day. Know how much alcohol is in your drink. In the [place] one drink equals one 12 oz bottle of beer (355 mL), one 5 oz glass of wine (148 mL), or one 1 oz glass of hard liquor (44 mL). Lifestyle Do not use any products that contain nicotine or tobacco. These products include cigarettes, chewing tobacco, and vaping devices, such as e-cigarettes. If you need help quitting, ask your health care provider. Do not use street drugs. Do not share needles. Ask your health care provider for help if you need support or information about quitting drugs. General instructions Schedule regular health, dental, and eye exams. Stay current with your vaccines. Tell your health care provider if: You often feel depressed. You have ever been abused or do not feel safe at home. Summary Adopting a healthy lifestyle and getting preventive care are important in promoting health and wellness. Follow your health care provider's instructions about healthy diet, exercising, and getting tested or screened for diseases. Follow your health care provider's instructions on monitoring your cholesterol and blood pressure. This information is not intended to replace advice given to you by your health care provider. Make sure you discuss any questions you have with your health care provider. Document Revised: 02/09/2021 Document Reviewed: 02/09/2021 Elsevier Patient Education  2023 [employer]  

## 2022-10-21 LAB — CBC
Hematocrit: 30.7 % — ABNORMAL LOW (ref 37.5–51.0)
Hemoglobin: 10 g/dL — ABNORMAL LOW (ref 13.0–17.7)
MCH: 28.4 pg (ref 26.6–33.0)
MCHC: 32.6 g/dL (ref 31.5–35.7)
MCV: 87 fL (ref 79–97)
Platelets: 322 10*3/uL (ref 150–450)
RBC: 3.52 x10E6/uL — ABNORMAL LOW (ref 4.14–5.80)
RDW: 13.8 % (ref 11.6–15.4)
WBC: 3 10*3/uL — ABNORMAL LOW (ref 3.4–10.8)

## 2022-10-21 LAB — MICROALBUMIN / CREATININE URINE RATIO
Creatinine, Urine: 86.7 mg/dL
Microalb/Creat Ratio: 18 mg/g creat (ref 0–29)
Microalbumin, Urine: 16 ug/mL

## 2022-10-21 LAB — IRON,TIBC AND FERRITIN PANEL
Ferritin: 50 ng/mL (ref 30–400)
Iron Saturation: 15 % (ref 15–55)
Iron: 54 ug/dL (ref 38–169)
Total Iron Binding Capacity: 354 ug/dL (ref 250–450)
UIBC: 300 ug/dL (ref 111–343)

## 2022-10-21 NOTE — Progress Notes (Signed)
Internal Medicine Clinic Attending  Case discussed with Dr. Simeon Craft  At the time of the visit.  We reviewed the resident's history and exam and pertinent patient test results.  I agree with the assessment, diagnosis, and plan of care documented in the resident's note.

## 2022-10-21 NOTE — Addendum Note (Signed)
Addended by: Romana Juniper on: 10/21/2022 12:03 PM   Modules accepted: Level of Service

## 2022-10-25 ENCOUNTER — Other Ambulatory Visit: Payer: Self-pay | Admitting: Student

## 2022-10-25 DIAGNOSIS — D509 Iron deficiency anemia, unspecified: Secondary | ICD-10-CM

## 2022-10-25 NOTE — Addendum Note (Signed)
Addended by: Romana Juniper on: 10/25/2022 05:31 PM   Modules accepted: Level of Service

## 2022-10-25 NOTE — Progress Notes (Signed)
Iron deficiency anemia with saturation rations <20 despite oral iron supplementation. Will refer to GI for diagnostic colonoscopy and continue PO iron supplementation every other day. Patient called and aware.

## 2022-10-26 NOTE — Progress Notes (Signed)
Internal Medicine Clinic Attending  Case and documentation of Dr. Gomez Caraballo  at the time of the visit reviewed.  I reviewed the AWV findings.  I agree with the assessment, diagnosis, and plan of care documented in the AWV note.     

## 2023-04-15 ENCOUNTER — Other Ambulatory Visit (HOSPITAL_COMMUNITY): Payer: Self-pay | Admitting: Urology

## 2023-04-15 DIAGNOSIS — C7951 Secondary malignant neoplasm of bone: Secondary | ICD-10-CM

## 2023-06-10 ENCOUNTER — Telehealth (HOSPITAL_COMMUNITY): Payer: Self-pay | Admitting: Family Medicine

## 2023-06-10 ENCOUNTER — Emergency Department (HOSPITAL_COMMUNITY): Payer: 59

## 2023-06-10 ENCOUNTER — Observation Stay (HOSPITAL_COMMUNITY)
Admission: EM | Admit: 2023-06-10 | Discharge: 2023-06-12 | Disposition: A | Discharge status: Home / Self Care | Payer: 59 | Attending: Internal Medicine | Admitting: Internal Medicine

## 2023-06-10 ENCOUNTER — Encounter (HOSPITAL_COMMUNITY): Payer: Self-pay

## 2023-06-10 ENCOUNTER — Ambulatory Visit (INDEPENDENT_AMBULATORY_CARE_PROVIDER_SITE_OTHER)
Admission: EM | Admit: 2023-06-10 | Discharge: 2023-06-10 | Disposition: A | Discharge status: Home / Self Care | Payer: 59 | Source: Home / Self Care

## 2023-06-10 ENCOUNTER — Telehealth (HOSPITAL_COMMUNITY): Payer: Self-pay

## 2023-06-10 ENCOUNTER — Other Ambulatory Visit: Payer: Self-pay

## 2023-06-10 DIAGNOSIS — K922 Gastrointestinal hemorrhage, unspecified: Secondary | ICD-10-CM

## 2023-06-10 DIAGNOSIS — R5383 Other fatigue: Secondary | ICD-10-CM | POA: Insufficient documentation

## 2023-06-10 DIAGNOSIS — Z87891 Personal history of nicotine dependence: Secondary | ICD-10-CM | POA: Diagnosis not present

## 2023-06-10 DIAGNOSIS — D638 Anemia in other chronic diseases classified elsewhere: Secondary | ICD-10-CM | POA: Diagnosis present

## 2023-06-10 DIAGNOSIS — Z7984 Long term (current) use of oral hypoglycemic drugs: Secondary | ICD-10-CM | POA: Insufficient documentation

## 2023-06-10 DIAGNOSIS — K921 Melena: Secondary | ICD-10-CM | POA: Diagnosis not present

## 2023-06-10 DIAGNOSIS — E119 Type 2 diabetes mellitus without complications: Secondary | ICD-10-CM | POA: Diagnosis not present

## 2023-06-10 DIAGNOSIS — R0602 Shortness of breath: Secondary | ICD-10-CM | POA: Insufficient documentation

## 2023-06-10 DIAGNOSIS — D509 Iron deficiency anemia, unspecified: Secondary | ICD-10-CM | POA: Diagnosis present

## 2023-06-10 DIAGNOSIS — K259 Gastric ulcer, unspecified as acute or chronic, without hemorrhage or perforation: Principal | ICD-10-CM | POA: Insufficient documentation

## 2023-06-10 DIAGNOSIS — I10 Essential (primary) hypertension: Secondary | ICD-10-CM

## 2023-06-10 DIAGNOSIS — Z79899 Other long term (current) drug therapy: Secondary | ICD-10-CM | POA: Diagnosis not present

## 2023-06-10 DIAGNOSIS — K449 Diaphragmatic hernia without obstruction or gangrene: Secondary | ICD-10-CM | POA: Diagnosis not present

## 2023-06-10 DIAGNOSIS — D649 Anemia, unspecified: Principal | ICD-10-CM | POA: Diagnosis present

## 2023-06-10 DIAGNOSIS — K250 Acute gastric ulcer with hemorrhage: Secondary | ICD-10-CM

## 2023-06-10 DIAGNOSIS — Z8546 Personal history of malignant neoplasm of prostate: Secondary | ICD-10-CM | POA: Diagnosis not present

## 2023-06-10 LAB — POCT URINALYSIS DIP (MANUAL ENTRY)
Bilirubin, UA: NEGATIVE
Blood, UA: NEGATIVE
Glucose, UA: NEGATIVE mg/dL
Ketones, POC UA: NEGATIVE mg/dL
Leukocytes, UA: NEGATIVE
Nitrite, UA: NEGATIVE
Protein Ur, POC: NEGATIVE mg/dL
Spec Grav, UA: 1.02 (ref 1.010–1.025)
Urobilinogen, UA: 1 U/dL
pH, UA: 7 (ref 5.0–8.0)

## 2023-06-10 LAB — POC OCCULT BLOOD, ED: Fecal Occult Bld: POSITIVE — AB

## 2023-06-10 LAB — BASIC METABOLIC PANEL
Anion gap: 15 (ref 5–15)
BUN: 17 mg/dL (ref 8–23)
CO2: 25 mmol/L (ref 22–32)
Calcium: 8.5 mg/dL — ABNORMAL LOW (ref 8.9–10.3)
Chloride: 99 mmol/L (ref 98–111)
Creatinine, Ser: 0.77 mg/dL (ref 0.61–1.24)
GFR, Estimated: >60 mL/min (ref >60)
Glucose, Bld: 223 mg/dL — ABNORMAL HIGH (ref 70–99)
Potassium: 3.8 mmol/L (ref 3.5–5.1)
Sodium: 139 mmol/L (ref 135–145)

## 2023-06-10 LAB — CBC WITH DIFFERENTIAL/PLATELET
Abs Immature Granulocytes: 0.11 10*3/uL — ABNORMAL HIGH (ref 0.00–0.07)
Basophils Absolute: 0 10*3/uL (ref 0.0–0.1)
Basophils Relative: 0 %
Eosinophils Absolute: 0 10*3/uL (ref 0.0–0.5)
Eosinophils Relative: 0 %
HCT: 18.6 % — ABNORMAL LOW (ref 39.0–52.0)
Hemoglobin: 5.8 g/dL — CL (ref 13.0–17.0)
Immature Granulocytes: 1 %
Lymphocytes Relative: 12 %
Lymphs Abs: 1.2 10*3/uL (ref 0.7–4.0)
MCH: 28.3 pg (ref 26.0–34.0)
MCHC: 31.2 g/dL (ref 30.0–36.0)
MCV: 90.7 fL (ref 80.0–100.0)
Monocytes Absolute: 1.2 10*3/uL — ABNORMAL HIGH (ref 0.1–1.0)
Monocytes Relative: 12 %
Neutro Abs: 7.5 10*3/uL (ref 1.7–7.7)
Neutrophils Relative %: 75 %
Platelets: 335 10*3/uL (ref 150–400)
RBC: 2.05 MIL/uL — ABNORMAL LOW (ref 4.22–5.81)
RDW: 18.5 % — ABNORMAL HIGH (ref 11.5–15.5)
Smear Review: NORMAL
WBC: 10 10*3/uL (ref 4.0–10.5)
nRBC: 6 % — ABNORMAL HIGH (ref 0.0–0.2)

## 2023-06-10 LAB — CBC
HCT: 19.1 % — ABNORMAL LOW (ref 39.0–52.0)
Hemoglobin: 6 g/dL — CL (ref 13.0–17.0)
MCH: 28.6 pg (ref 26.0–34.0)
MCHC: 31.4 g/dL (ref 30.0–36.0)
MCV: 91 fL (ref 80.0–100.0)
Platelets: 341 10*3/uL (ref 150–400)
RBC: 2.1 MIL/uL — ABNORMAL LOW (ref 4.22–5.81)
RDW: 18.8 % — ABNORMAL HIGH (ref 11.5–15.5)
WBC: 8.9 10*3/uL (ref 4.0–10.5)
nRBC: 6.7 % — ABNORMAL HIGH (ref 0.0–0.2)

## 2023-06-10 LAB — POCT FASTING CBG KUC MANUAL ENTRY: POCT Glucose (KUC): 147 mg/dL — AB (ref 70–99)

## 2023-06-10 LAB — PREPARE RBC (CROSSMATCH)

## 2023-06-10 MED ORDER — PANTOPRAZOLE SODIUM 40 MG IV SOLR
40.0000 mg | Freq: Two times a day (BID) | INTRAVENOUS | Status: DC
  Administered 2023-06-11 – 2023-06-12 (×4): 40 mg via INTRAVENOUS
  Filled 2023-06-10 (×4): qty 10

## 2023-06-10 MED ORDER — AMLODIPINE BESYLATE 5 MG PO TABS: 5.0000 mg | ORAL_TABLET | Freq: Every day | ORAL | 0 refills | Status: DC

## 2023-06-10 MED ORDER — SODIUM CHLORIDE 0.9% IV SOLUTION: Freq: Once | INTRAVENOUS | Status: AC

## 2023-06-10 MED ORDER — ACETAMINOPHEN 650 MG RE SUPP: 650.0000 mg | Freq: Four times a day (QID) | RECTAL | Status: DC | PRN

## 2023-06-10 MED ORDER — ACETAMINOPHEN 325 MG PO TABS: 650.0000 mg | ORAL_TABLET | Freq: Four times a day (QID) | ORAL | Status: DC | PRN

## 2023-06-10 MED ORDER — SODIUM CHLORIDE 0.9% FLUSH
3.0000 mL | Freq: Two times a day (BID) | INTRAVENOUS | Status: DC
  Administered 2023-06-11 – 2023-06-12 (×4): 3 mL via INTRAVENOUS

## 2023-06-10 NOTE — ED Provider Notes (Signed)
[place] EMERGENCY DEPARTMENT AT [facility] Provider Note   CSN: [id number] Arrival date & time: 06/10/23  1920     History  Chief Complaint  Patient presents with   Abnormal Lab   Shortness of Breath    BEACHER GIGLIA is a 72 y.o. male.  Presented emergency department with dark tarry stools after being evaluated in urgent care and found to have anemia.  Patient reports that his only symptom is generalized fatigue.  No lightheadedness, dizziness, chest pain or shortness of breath.  Reports since Monday has had dark tarry stools.  Denies any blood thinner use, BC Goody powders or NSAID use.  Reports having history of anemia and needing blood transfusion.   Abnormal Lab Shortness of Breath      Home Medications Prior to Admission medications   Medication Sig Start Date End Date Taking? Authorizing Provider  acetaminophen (TYLENOL) 500 MG tablet Take 2 tablets (1,000 mg total) by mouth every 6 (six) hours as needed for mild pain or moderate pain. 05/01/21   Fayrene Helper, PA-C  amLODipine (NORVASC) 5 MG tablet Take 1 tablet (5 mg total) by mouth daily. 06/10/23 06/09/24  Bing Neighbors, NP  BETIMOL 0.5 % ophthalmic solution INT 1 GTT IN OD BID 04/13/18   [provider]  brimonidine (ALPHAGAN) 0.2 % ophthalmic solution INT 1 GTT INTO THE RIGHT EYE BID 02/17/18   [provider]  CALCIUM CITRATE PO Take 600 mg by mouth in the morning and at bedtime.    [provider]  cholecalciferol (VITAMIN D3) 10 MCG (400 UNIT) TABS tablet Take 1,000 Units by mouth daily.    [provider]  dorzolamide (TRUSOPT) 2 % ophthalmic solution INT 1 GTT IN OD BID 11/11/18   [provider]  metFORMIN (GLUCOPHAGE-XR) 500 MG 24 hr tablet Take 1 tablet (500 mg total) by mouth 2 (two) times daily with a meal. 10/20/22 10/15/23  Morene Crocker, MD  Multiple Vitamins-Minerals (ONE-A-DAY PROACTIVE 65+) TABS Take 1 tablet by mouth daily.     [provider]  sildenafil (VIAGRA) 100 MG tablet  10/18/19   [provider]  vitamin E 180 MG (400 UNITS) capsule Take 400 Units by mouth daily.    [provider]  XTANDI 40 MG capsule Take 160 mg by mouth daily. 11/19/19   [provider]      Allergies    Patient has no known allergies.    Review of Systems   Review of Systems  Respiratory:  Positive for shortness of breath.     Physical Exam Updated Vital Signs BP (!) 162/91   Pulse 69   Temp 97.8 F (36.6 C) (Oral)   Resp 13   Ht 5\' 10"  (1.778 m)   Wt 81.6 kg   SpO2 100%   BMI 25.83 kg/m  Physical Exam  ED Results / Procedures / Treatments   Labs (all labs ordered are listed, but only abnormal results are displayed) Labs Reviewed  CBC - Abnormal; Notable for the following components:      Result Value   RBC 2.10 (*)    Hemoglobin 6.0 (*)    HCT 19.1 (*)    RDW 18.8 (*)    nRBC 6.7 (*)    All other components within normal limits  BASIC METABOLIC PANEL - Abnormal; Notable for the following components:   Glucose, Bld 223 (*)    Calcium 8.5 (*)    All other components within normal limits  POC OCCULT BLOOD, ED - Abnormal; Notable for the following components:   Fecal Occult Bld POSITIVE (*)    All other components within normal limits  HIV ANTIBODY (ROUTINE TESTING W REFLEX)  COMPREHENSIVE METABOLIC PANEL  CBC  PROTIME-INR  APTT  IRON AND TIBC  FERRITIN  TYPE AND SCREEN  PREPARE RBC (CROSSMATCH)    EKG None  Radiology DG Chest 2 View  Result Date: 06/10/2023 CLINICAL DATA:  Shortness of breath. EXAM: CHEST - 2 VIEW COMPARISON:  December 06, 2014 FINDINGS: The heart size and mediastinal contours are within normal limits. Very mild focal scarring and/or atelectasis is seen along the periphery of the mid right lung. There is no evidence of an acute infiltrate, pleural effusion or pneumothorax. Multiple chronic left-sided rib fractures are noted. Multilevel degenerative  changes seen throughout the thoracic spine. IMPRESSION: Very mild mid right lung focal scarring and/or atelectasis. Correlation with nonemergent chest CT is recommended to exclude the presence of a small pulmonary nodule. Electronically Signed   By: Aram Candela M.D.   On: 06/10/2023 20:46    Procedures Procedures    Medications Ordered in ED Medications  sodium chloride flush (NS) 0.9 % injection 3 mL (has no administration in time range)  acetaminophen (TYLENOL) tablet 650 mg (has no administration in time range)    Or  acetaminophen (TYLENOL) suppository 650 mg (has no administration in time range)  pantoprazole (PROTONIX) injection 40 mg (has no administration in time range)  0.9 %  sodium chloride infusion (Manually program via Guardrails IV Fluids) ( Intravenous New Bag/Given 06/10/23 2141)    ED Course/ Medical Decision Making/ A&P Clinical Course as of 06/10/23 2339  Fri Jun 10, 2023  2057 DG Chest 2 View MPRESSION: Very mild mid right lung focal scarring and/or atelectasis. Correlation with nonemergent chest CT is recommended to exclude the presence of a small pulmonary nodule.   [TY]    Clinical Course User Index [TY] Coral Spikes, DO                                 Medical Decision Making 72 year old male present emergency department with generalized fatigue and reported low hemoglobin outpatient testing.  He is afebrile nontachycardic hemodynamic stable.  Soft benign abdomen.  Digital rectal exam with no gross blood.  However Hemoccult positive.  He is anemic today with hemoglobin of 6.  Blood products ordered.  No other significant abnormality on labs.  Case discussed with GI who will see the patient for possible endoscopy/colonoscopy.  Will admit patient at this time.  Amount and/or Complexity of Data Reviewed External Data Reviewed: labs.    Details: Hemoglobin 5 previously. Labs: ordered. Radiology:  Decision-making details documented in ED  Course.  Risk Prescription drug management. Decision regarding hospitalization.          Final Clinical Impression(s) / ED Diagnoses Final diagnoses:  Anemia, unspecified type  Gastrointestinal hemorrhage, unspecified gastrointestinal hemorrhage type    Rx / DC Orders ED Discharge Orders     None         Coral Spikes, DO 06/10/23 2339

## 2023-06-10 NOTE — H&P (Signed)
Date: 06/11/2023               Patient Name:  William Jones MRN: [id number]  DOB: 03-29-1951 Age / Sex: 72 y.o., male   PCP: Morene Crocker, MD         Medical Service: Internal Medicine Teaching Service         Attending Physician: Dr. Dickie La, MD      First Contact: Dr. Manuela Neptune, MD Pager [phone]    Second Contact: Dr. Marrianne Mood, MD Pager [phone]         After Hours (After 5p/  First Contact Pager: [phone]  weekends / holidays): Second Contact Pager: [phone]   SUBJECTIVE   Chief Complaint: fatigue and black tarry stools  History of Present Illness: William Jones is a 72 year old male with a past medical history of metastatic prostate cancer to the bone s/p radiation and on antiandrogenic therapy c/b prior history of radiation proctitis, angiodysplasia of colon with hemorrhage, duodenitis, IDA, HTN, T2DM, who presents to the emergency department with fatigue and black stool for five days.   He reported feeling well on Sunday with normal energy level and when he got home, he ate and felt abdominal bloating. On Monday morning, he had two dark black bowel movements accompanied with new onset fatigue and shortness of breath. He denied abdominal pain, but reports daily symptoms of heartburn such as acid reflux pain and acidic taste in mouth. He has never been on acid suppressants.  Patient also reports that if he lays down after he eats at night, it feels like food gets stuck in his throat, he denies dysphagia and weight loss. He denies bright red stool per rectum, use of NSAIDs, hemoptysis, hematemesis, hematuria, or bone pain. He denies chest pain, palpitations, and tachycardia.   Of note, he was instructed to use oral iron supplements for IDA, but did not begin the supplements until after he saw the melena and became scared on Monday.   He has a history of BRBPR which was evaluated with Dr. Elnoria Howard (GI) in February 2024. Per his note, "Even  though the FFS 2 years ago was negative for any recurrence of radiation proctitis, with the bleeding, a repeat evaluation is warranted. He had a colonoscopy in 2015 and next year will be his 10 year follow up. With the bleeding he will have his colonoscopy sooner. If radiation proctitis is identified again, then gentle snare tip cautery will be applied to ablate and small angioectasias. " Per patient, he had a colonoscopy after the appointment in February where Dr. Elnoria Howard removed some polyps and he was instructed to return if he had recurrent bleeding but was unable to say when the next follow up colonoscopy is recommended.   He also has a history of prostate cancer metastatic to the bone. He is followed by urology monthly and is on a regimen of Xtandi daily (4 tablets) and Xgeva monthly injections. He is due for a whole body bone scan.    ED Course: Patient presented afebrile, tachycardic HR 107, hypertensive (179/101). CBC showed hemoglobin of 5.8 and he received one unit of blood. IMTS was consulted for admission of symptomatic anemia, GI was consulted as well.   Meds Amlodipine 5mg  Metformin 500mg  BID Xtandi 4 daily Xgeva monthly injections    Past Medical History: -Metastatic prostate cancer -Radiation proctitis -T2DM -Iron deficiency anemia -Angiodysplasia of colon with hemorrhage -Duodenitis -Remote history of polysubstance use  -HTN   Past Surgical History:  Procedure Laterality Date   COLONOSCOPY N/A 01/10/2014   Procedure: COLONOSCOPY;  Surgeon: Theda Belfast, MD;  Location: [facility] ENDOSCOPY;  Service: Endoscopy;  Laterality: N/A;   ESOPHAGOGASTRODUODENOSCOPY N/A 01/10/2014   Procedure: ESOPHAGOGASTRODUODENOSCOPY (EGD);  Surgeon: Theda Belfast, MD;  Location: [facility] ENDOSCOPY;  Service: Endoscopy;  Laterality: N/A;   FLEXIBLE SIGMOIDOSCOPY N/A 09/19/2015   Procedure: FLEXIBLE SIGMOIDOSCOPY;  Surgeon: Jeani Hawking, MD;  Location: [facility] ENDOSCOPY;  Service: Endoscopy;  Laterality: N/A;    FLEXIBLE SIGMOIDOSCOPY N/A 06/03/2017   Procedure: FLEXIBLE SIGMOIDOSCOPY;  Surgeon: Jeani Hawking, MD;  Location: [facility] ENDOSCOPY;  Service: Endoscopy;  Laterality: N/A;   FLEXIBLE SIGMOIDOSCOPY N/A 01/06/2018   Procedure: FLEXIBLE SIGMOIDOSCOPY;  Surgeon: Jeani Hawking, MD;  Location: [facility] ENDOSCOPY;  Service: Endoscopy;  Laterality: N/A;   GIVENS CAPSULE STUDY N/A 01/10/2014   Procedure: GIVENS CAPSULE STUDY;  Surgeon: Theda Belfast, MD;  Location: [facility] ENDOSCOPY;  Service: Endoscopy;  Laterality: N/A;   HOT HEMOSTASIS N/A 09/19/2015   Procedure: HOT HEMOSTASIS (ARGON PLASMA COAGULATION/BICAP);  Surgeon: Jeani Hawking, MD;  Location: [facility] ENDOSCOPY;  Service: Endoscopy;  Laterality: N/A;   HOT HEMOSTASIS N/A 06/03/2017   Procedure: HOT HEMOSTASIS (ARGON PLASMA COAGULATION/BICAP);  Surgeon: Jeani Hawking, MD;  Location: [facility] ENDOSCOPY;  Service: Endoscopy;  Laterality: N/A;   PROSTATE BIOPSY  03/05/14   Gleason 7, vol 45 gm    Social:  Lives With:Girlfriend Occupation:Retired, helps at a church Support:Family support with daughter Level of Function:Independent ADLs and iADLs PCP:Dr. Morene [facility]cker, [facility] at [facility] Substances:Former Tobacco 15 pack years, Former daily alcohol use (sober for 15 years), former crack cocaine and marijuana use, sober since 2009  Family History:  Mother: edema of LE  Allergies: Allergies as of 06/10/2023   (No Known Allergies)    Review of Systems: A complete ROS was negative except as per HPI.   OBJECTIVE:   Physical Exam: Blood pressure (!) 162/91, pulse 69, temperature 97.8 F (36.6 C), temperature source Oral, resp. rate 13, height 5\' 10"  (1.778 m), weight 81.6 kg, SpO2 100%.  Constitutional: well-appearing, comfortably sitting in bed, in no acute distress HENT: normocephalic atraumatic, mucous membranes moist Cardiovascular: regular rate and rhythm, no m/r/g Pulmonary/Chest: normal work of breathing on room air, lungs clear to auscultation bilaterally. No  crackles  Abdominal: soft, non-tender, non-distended, bowel sounds present Neurological: alert & oriented x 3, moving all four extremities  MSK: no gross abnormalities. NO pitting edema, radial, DP, and TP pulses are present and symmetrical  Skin: warm and dry Psych: Normal mood and affect  Labs: CBC    Component Value Date/Time   WBC 8.9 06/10/2023 1948   RBC 2.10 (L) 06/10/2023 1948   HGB 6.0 (LL) 06/10/2023 1948   HGB 10.0 (L) 10/20/2022 1026   HCT 19.1 (L) 06/10/2023 1948   HCT 30.7 (L) 10/20/2022 1026   PLT 341 06/10/2023 1948   PLT 322 10/20/2022 1026   MCV 91.0 06/10/2023 1948   MCV 87 10/20/2022 1026   MCH 28.6 06/10/2023 1948   MCHC 31.4 06/10/2023 1948   RDW 18.8 (H) 06/10/2023 1948   RDW 13.8 10/20/2022 1026   LYMPHSABS 1.2 06/10/2023 1238   LYMPHSABS 1.3 04/10/2019 1006   MONOABS 1.2 (H) 06/10/2023 1238   EOSABS 0.0 06/10/2023 1238   EOSABS 0.2 04/10/2019 1006   BASOSABS 0.0 06/10/2023 1238   BASOSABS 0.0 04/10/2019 1006     CMP     Component Value Date/Time   NA 139 06/10/2023 1948   NA 137  03/19/2022 0933   K 3.8 06/10/2023 1948   CL 99 06/10/2023 1948   CO2 25 06/10/2023 1948   GLUCOSE 223 (H) 06/10/2023 1948   BUN 17 06/10/2023 1948   BUN 10 03/19/2022 0933   CREATININE 0.77 06/10/2023 1948   CALCIUM 8.5 (L) 06/10/2023 1948   PROT 6.3 04/10/2019 1006   ALBUMIN 3.9 04/10/2019 1006   AST 19 04/10/2019 1006   ALT 25 04/10/2019 1006   ALKPHOS 39 04/10/2019 1006   BILITOT <0.2 04/10/2019 1006   GFRNONAA >60 06/10/2023 1948   GFRAA 103 12/18/2019 0944    Imaging: DG Chest 2 View  Result Date: 06/10/2023 CLINICAL DATA:  Shortness of breath. EXAM: CHEST - 2 VIEW COMPARISON:  December 06, 2014 FINDINGS: The heart size and mediastinal contours are within normal limits. Very mild focal scarring and/or atelectasis is seen along the periphery of the mid right lung. There is no evidence of an acute infiltrate, pleural effusion or pneumothorax. Multiple chronic  left-sided rib fractures are noted. Multilevel degenerative changes seen throughout the thoracic spine. IMPRESSION: Very mild mid right lung focal scarring and/or atelectasis. Correlation with nonemergent chest CT is recommended to exclude the presence of a small pulmonary nodule. Electronically Signed   By: Aram Candela M.D.   On: 06/10/2023 20:46      EKG: personally reviewed my interpretation is NSR with T wave inversion in lead III. Prior EKG has does not have T wave inversion.    ASSESSMENT & PLAN:   Assessment & Plan by Problem: Principal Problem:   Symptomatic anemia   William Jones is a 72 year old male with a past medical history of metastatic prostate cancer to the bone s/p radiation and on antiandrogenic therapy c/b prior history of radiation proctitis, angiodysplasia of colon with hemorrhage, duodenitis, IDA, HTN, T2DM who presented with fatigue and melena and admitted for symptomatic anemia on hospital day 0  Symptomatic anemia History of Iron Deficiency Anemia  Chronic normocytic anemia Patient presents with symptomatic normocytic anemia with hemoglobin of 5.8, elevated RDW, and five day history of melena. Of note, patient denies hematuria, hemoptysis, hematemesis, and bone pain. FOBT positive and melanotic stools pointing to upper GI bleed. Risk factors for this in prior hx of EtOH use, NSAIDs, and dyspepsia vs GERD with suspicion for PUD vs stricture vs cancer with his medical history. However, will need to confirm source of bleed. Normocytic anemia in this patient is most likely due to chronic cancer along with medication side effects. There are other etiologies considered as well, but at this time, we will focus on the acute blood loss.  Plan: -GI consulted  -Begin IV 40mg  PPI BID -NPO with sips with meds -Iron panel ordered, consider IV vs oral iron after panel has resulted as patient awaits GI evaluation -B12 and B9 levels ordered for possible macrocytic and  microcytic anemia  -s/p one unit of pRBCs, will repeat H&H  -Transfusion goal <7 or if persistently symptomatic -Consider CBC with smear to evaluate other causes of normocytic anemia   Radiation Proctitis Duodenitis  History of Angiodysplasia of colon with hemorrhage Patient has a history of radiation proctitis due to radiation treatment of prostate cancer. In previous sigmoidoscopes and colonoscopies, he was found to have radiation proctitis that was negative for recurrence in 2022. Found to have larger internal hemorrhoids as well during a sigmoidoscopy in 2019. He denies BRBPR, lower GI bleed is unlikely causing the symptomatic anemia. Plan: -Will follow and appreciate GI recommendations  Type 2 Diabetes Mellitus  Patient is NPO at this time. Last A1c Jan 6.7 on Metformin therapy 500 mg BID. -Hold home metformin -CBG check in the morning  -Consider starting SSI if persistently hyperglycemic when he is no longer NPO  Hx Prostate Cancer s/p radiation Metastasis to bone On nightly androgen receptor inhibitor, Xtandi ( 4 pills daily).  He is also managed with monthly infusions of Denusumab and monitoring per Urology. Plan: -Consider restarting medication tomorrow -Schedule for bone scan in Oct 2024  Hypertension  He is on a home regimen of amlodipine -Will monitor BP for now -Consider res-starting home med if persistently hypertensive  Abnormal finding on CXR Admission CXR with very mild mid right lung focal scarring and/or atelectasis. Plan:  -Outpatient Correlation with nonemergent chest CT is recommended to exclude the presence of a small pulmonary nodule  Diet: NPO VTE: SCDs Code: Full  Prior to Admission Living Arrangement: Home, living with girlfriend  Anticipated Discharge Location: Home Barriers to Discharge: Medical Treatment   Dispo: Admit patient to Observation with expected length of stay less than 2 midnights.  Signed:   Faith Rogue, DO Internal Medicine  Resident: PGY-1  Please contact the on call pager after 5pm and during the weekends: [phone]

## 2023-06-10 NOTE — Hospital Course (Addendum)
Symptomatic anemia Monday Fatigue Tired  Hgb 6.0 GI in the ED - nothing emergent Unit of blood transfusing in the ED NPO IV PPI   PO iron in Jan 2024 Saw the black tarry stools on Monday got scared and started taking   Dr. Elnoria Howard  11/22/2022 Even though the [facility] 2 years ago was negative for any recurrence of radiation proctitis, with the bleeding, a repeat evaluation is warranted. He had a colonoscopy in 2015 and next year will be his 10 year follow up. With the bleeding he will have his colonoscopy sooner. If radiation proctitis is identified again, then gentle snare tip cautery will be applied to ablate and small angioectasias.   Removed some polyps Continue to bleed come bacl Radiation proctatis? Patient presented with blood present  Acid reflux with some dietary indiscretions. No oral acid suppressants per patient. HAS been experiencing it every day, especially at night before he goes to bed  Prostate cancer Seen by urology monthly Xtandi daily (4 tablets) and Xgeva monthly injections

## 2023-06-10 NOTE — ED Notes (Signed)
ED TO INPATIENT HANDOFF REPORT  ED Nurse Name and Phone #: Randall Hiss [phone]  S Name/Age/Gender William Jones 72 y.o. male Room/Bed: 025C/025C  Code Status   Code Status: Full Code  Home/SNF/Other Home Patient oriented to: self, place, time, and situation Is this baseline? Yes   Triage Complete: Triage complete  Chief Complaint Symptomatic anemia [D64.9]  Triage Note Pt arrives to ED c/o abnormal hemoglobin of 5 taken at urgent care. Pt endorses SOB and black tarry stools since Monday which he reports have gotten better. Pt is not on thinners and has hx of receiving transfusions in past   Allergies No Known Allergies  Level of Care/Admitting Diagnosis ED Disposition     ED Disposition  Admit   Condition  --   Comment  Hospital Area: [facility] [[id number]]  Level of Care: Progressive [102]  Admit to Progressive based on following criteria: GI, ENDOCRINE disease patients with GI bleeding, acute liver failure or pancreatitis, stable with diabetic ketoacidosis or thyrotoxicosis (hypothyroid) state.  May place patient in observation at [facility] or [facility] if equivalent level of care is available:: No  Covid Evaluation: Asymptomatic - no recent exposure (last 10 days) testing not required  Diagnosis: Symptomatic anemia [[id number]]  Admitting Physician: Dickie La [[id number]]  Attending Physician: Dickie La [[id number]]          B Medical/Surgery History Past Medical History:  Diagnosis Date   Angiodysplasia of colon with hemorrhage 10/14/2020   Duodenitis    Essential hypertension 07/16/2015   Fatigue 11/06/2020   Glaucoma    H/O: substance abuse (HCC)    Hemorrhage of rectum and anus 10/14/2020   History of frostbite    History of prostate cancer 03/20/2014   Lost to f/u with [facility] in the past 2011 with elevated PSA >30 at that time.  Saw [facility] (Dr. Isabel Caprice) on 03/13/14. Cancer Noted 03/13/14 office visit. Gleason  score 7. Plan CT Ab/pelvis with contrast, bone scan    History of radiation therapy nov 2015 to feb 2016   Hyperlipidemia 11/20/2019   Iron deficiency anemia    Malignant tumor of prostate (HCC) 10/14/2020   Peripheral neuropathy    Prostate cancer (HCC) 03/05/14   Gleason 7, volume 45 gm   Rash and nonspecific skin eruption 08/04/2017   Rectal mass 09/15/2016   S/P radiation therapy 09/18/14-11/15/14   prostate/ 7800Gy/40sessions   Thrombocytopenia (HCC)    Past Surgical History:  Procedure Laterality Date   COLONOSCOPY N/A 01/10/2014   Procedure: COLONOSCOPY;  Surgeon: Theda Belfast, MD;  Location: [facility] ENDOSCOPY;  Service: Endoscopy;  Laterality: N/A;   ESOPHAGOGASTRODUODENOSCOPY N/A 01/10/2014   Procedure: ESOPHAGOGASTRODUODENOSCOPY (EGD);  Surgeon: Theda Belfast, MD;  Location: [facility] ENDOSCOPY;  Service: Endoscopy;  Laterality: N/A;   FLEXIBLE SIGMOIDOSCOPY N/A 09/19/2015   Procedure: FLEXIBLE SIGMOIDOSCOPY;  Surgeon: Jeani Hawking, MD;  Location: [facility] ENDOSCOPY;  Service: Endoscopy;  Laterality: N/A;   FLEXIBLE SIGMOIDOSCOPY N/A 06/03/2017   Procedure: FLEXIBLE SIGMOIDOSCOPY;  Surgeon: Jeani Hawking, MD;  Location: [facility] ENDOSCOPY;  Service: Endoscopy;  Laterality: N/A;   FLEXIBLE SIGMOIDOSCOPY N/A 01/06/2018   Procedure: FLEXIBLE SIGMOIDOSCOPY;  Surgeon: Jeani Hawking, MD;  Location: [facility] ENDOSCOPY;  Service: Endoscopy;  Laterality: N/A;   GIVENS CAPSULE STUDY N/A 01/10/2014   Procedure: GIVENS CAPSULE STUDY;  Surgeon: Theda Belfast, MD;  Location: [facility] ENDOSCOPY;  Service: Endoscopy;  Laterality: N/A;   HOT HEMOSTASIS N/A 09/19/2015   Procedure: HOT HEMOSTASIS (ARGON PLASMA COAGULATION/BICAP);  Surgeon: Jeani Hawking, MD;  Location: [facility] ENDOSCOPY;  Service: Endoscopy;  Laterality: N/A;   HOT HEMOSTASIS N/A 06/03/2017   Procedure: HOT HEMOSTASIS (ARGON PLASMA COAGULATION/BICAP);  Surgeon: Jeani Hawking, MD;  Location: [facility] ENDOSCOPY;  Service: Endoscopy;  Laterality: N/A;   PROSTATE BIOPSY  03/05/14   Gleason  7, vol 45 gm     A IV Location/Drains/Wounds Patient Lines/Drains/Airways Status     Active Line/Drains/Airways     Name Placement date Placement time Site Days   Peripheral IV 06/10/23 20 G Left Antecubital 06/10/23  2129  Antecubital  less than 1            Intake/Output Last 24 hours No intake or output data in the 24 hours ending 06/10/23 2357  Labs/Imaging Results for orders placed or performed during the hospital encounter of 06/10/23 (from the past 48 hour(s))  Type and screen [facility]     Status: None (Preliminary result)   Collection Time: 06/10/23  7:40 PM  Result Value Ref Range   ABO/RH(D) A POS    Antibody Screen NEG    Sample Expiration 06/13/2023,2359    Unit Number [id number]    Blood Component Type RED CELLS,LR    Unit division 00    Status of Unit ISSUED    Transfusion Status OK TO TRANSFUSE    Crossmatch Result      Compatible Performed at [facility], [address] [place], [place] [address]   CBC     Status: Abnormal   Collection Time: 06/10/23  7:48 PM  Result Value Ref Range   WBC 8.9 4.0 - 10.5 K/uL   RBC 2.10 (L) 4.22 - 5.81 MIL/uL   Hemoglobin 6.0 (LL) 13.0 - 17.0 g/dL    Comment: CRITICAL VALUE NOTED.  VALUE IS CONSISTENT WITH PREVIOUSLY REPORTED AND CALLED VALUE. REPEATED TO VERIFY    HCT 19.1 (L) 39.0 - 52.0 %   MCV 91.0 80.0 - 100.0 fL   MCH 28.6 26.0 - 34.0 pg   MCHC 31.4 30.0 - 36.0 g/dL   RDW 74.2 (H) 59.5 - 63.8 %   Platelets 341 150 - 400 K/uL   nRBC 6.7 (H) 0.0 - 0.2 %    Comment: Performed at [facility], [address] [place], [place] [address]  Basic metabolic panel     Status: Abnormal   Collection Time: 06/10/23  7:48 PM  Result Value Ref Range   Sodium 139 135 - 145 mmol/L   Potassium 3.8 3.5 - 5.1 mmol/L   Chloride 99 98 - 111 mmol/L   CO2 25 22 - 32 mmol/L   Glucose, Bld 223 (H) 70 - 99 mg/dL    Comment: Glucose reference range applies only to samples taken after fasting  for at least 8 hours.   BUN 17 8 - 23 mg/dL   Creatinine, Ser 3.29 0.61 - 1.24 mg/dL   Calcium 8.5 (L) 8.9 - 10.3 mg/dL   GFR, Estimated >51 >88 mL/min    Comment: (NOTE) Calculated using the CKD-EPI Creatinine Equation (2021)    Anion gap 15 5 - 15    Comment: Performed at [facility], [address], [place], [place] [address]  POC occult blood, ED     Status: Abnormal   Collection Time: 06/10/23  9:17 PM  Result Value Ref Range   Fecal Occult Bld POSITIVE (A) NEGATIVE  Prepare RBC (crossmatch)     Status: None   Collection Time:  06/10/23  9:17 PM  Result Value Ref Range   Order Confirmation      ORDER PROCESSED BY BLOOD BANK Performed at [facility], [address] [place], [place] [address]    DG Chest 2 View  Result Date: 06/10/2023 CLINICAL DATA:  Shortness of breath. EXAM: CHEST - 2 VIEW COMPARISON:  December 06, 2014 FINDINGS: The heart size and mediastinal contours are within normal limits. Very mild focal scarring and/or atelectasis is seen along the periphery of the mid right lung. There is no evidence of an acute infiltrate, pleural effusion or pneumothorax. Multiple chronic left-sided rib fractures are noted. Multilevel degenerative changes seen throughout the thoracic spine. IMPRESSION: Very mild mid right lung focal scarring and/or atelectasis. Correlation with nonemergent chest CT is recommended to exclude the presence of a small pulmonary nodule. Electronically Signed   By: Aram Candela M.D.   On: 06/10/2023 20:46    Pending Labs Unresulted Labs (From admission, onward)     Start     Ordered   06/11/23 0500  Comprehensive metabolic panel  Tomorrow morning,   R        06/10/23 2309   06/11/23 0500  CBC  Tomorrow morning,   R        06/10/23 2309   06/11/23 0500  Protime-INR  Tomorrow morning,   R        06/10/23 2309   06/11/23 0500  APTT  Tomorrow morning,   R        06/10/23 2309   06/11/23 0500  Iron and TIBC  Tomorrow morning,   R         06/10/23 2309   06/11/23 0500  Ferritin  Tomorrow morning,   R        06/10/23 2309   06/10/23 2257  HIV Antibody (routine testing w rflx)  (HIV Antibody (Routine testing w reflex) panel)  Once,   R        06/10/23 2309            Vitals/Pain Today's Vitals   06/10/23 2230 06/10/23 2245 06/10/23 2248 06/10/23 2338  BP: (!) 146/83 (!) 162/91  (!) 166/88  Pulse: 74 69  72  Resp: (!) 23 13  14   Temp:   97.8 F (36.6 C) 97.7 F (36.5 C)  TempSrc:   Oral Oral  SpO2: 100% 100%    Weight:      Height:      PainSc:        Isolation Precautions No active isolations  Medications Medications  sodium chloride flush (NS) 0.9 % injection 3 mL (has no administration in time range)  acetaminophen (TYLENOL) tablet 650 mg (has no administration in time range)    Or  acetaminophen (TYLENOL) suppository 650 mg (has no administration in time range)  pantoprazole (PROTONIX) injection 40 mg (has no administration in time range)  0.9 %  sodium chloride infusion (Manually program via Guardrails IV Fluids) ( Intravenous New Bag/Given 06/10/23 2141)    Mobility walks     Focused Assessments Cardiac Assessment Handoff:    Lab Results  Component Value Date   CKTOTAL 1,179 (H) 10/30/2007   CKMB 4.5 (H) 10/30/2007   TROPONINI <0.30 01/09/2014   No results found for: "DDIMER" Does the Patient currently have chest pain? No    R Recommendations: See Admitting Provider Note  Report given to:   Additional Notes: transfusion of 1 unit completed, pt is A&O, no pain,

## 2023-06-10 NOTE — H&P (Incomplete)
Date: 06/10/2023               Patient Name:  William Jones MRN: [id number]  DOB: 1951-03-30 Age / Sex: 72 y.o., male   PCP: Morene Crocker, MD         Medical Service: Internal Medicine Teaching Service         Attending Physician: Dr. Dickie La, MD      First Contact: Dr. Manuela Neptune, MD Pager [phone]    Second Contact: Dr. Marrianne Mood, MD Pager [phone]         After Hours (After 5p/  First Contact Pager: [phone]  weekends / holidays): Second Contact Pager: [phone]   SUBJECTIVE   Chief Complaint: fatigue and black tarry stools  History of Present Illness: William Jones is a 72 year old male with a past medical history of metastatic prostate cancer, radiation proctitis, T2DM, iron deficiency anemia, angiodysplasia of colon with hemorrhage, and duodenitis who presents to the emergency department with fatigue and black stool for five days.   He reported feeling well on Sunday with normal energy level and when he got home, he ate and felt abdominal bloating. On Monday morning, he had two dark black bowel movements accompanied with new onset fatigue and shortness of breath. He denied abdominal pain, but does report "feeling a hungry feeling" that has since resolved. He reports daily symptoms of heartburn such as acid reflux pain and acidic taste in mouth. He has never been on acid suppressants.  Patient also reports that if he lays down after he eats at night, it feels like food gets stuck in his throat, he denies dysphagia and weight loss. He denies bright red stool per rectum, use of NSAIDs, hemoptysis, hematemesis, hematuria, or bone pain. He denies chest pain, palpitations, and tachycardia.   Of note, he was instructed to use oral iron supplements for IDA, but did not begin the supplements until after he saw the melena and became scared on Monday.   He has a history of BRBPR which was evaluated with Dr. Elnoria Howard (GI) in February 2024. Per his  note, "Even though the FFS 2 years ago was negative for any recurrence of radiation proctitis, with the bleeding, a repeat evaluation is warranted. He had a colonoscopy in 2015 and next year will be his 10 year follow up. With the bleeding he will have his colonoscopy sooner. If radiation proctitis is identified again, then gentle snare tip cautery will be applied to ablate and small angioectasias. " Per patient, he had a colonoscopy after the appointment in February where Dr. Elnoria Howard removed some polyps and he was instructed to return if he had recurrent bleeding but was unable to say when the next follow up colonoscopy is recommended.   He also has a history of prostate cancer metastatic to the bone. He is followed by urology monthly and is on a regimen of Xtandi daily (4 tablets) and Xgeva monthly injections. He is due for a whole body bone scan.    ED Course: Patient presented afebrile, tachycardic HR 107, hypertensive (179/101). CBC showed hemoglobin of 5.8 and he received one unit of blood. IMTS was consulted for admission of symptomatic anemia, GI was consulted as well.   Meds Amlodipine 5mg  Metformin 500mg  BID Xtandi 4 daily Xgeva   Past Medical History: -Metastatic prostate cancer -Radiation proctitis -T2DM -Iron deficiency anemia -Angiodysplasia of colon with hemorrhage -Duodenitis -Remote history of polysubstance use  -HTN   Past Surgical History:  Procedure Laterality  Date  . COLONOSCOPY N/A 01/10/2014   Procedure: COLONOSCOPY;  Surgeon: Theda Belfast, MD;  Location: [facility] ENDOSCOPY;  Service: Endoscopy;  Laterality: N/A;  . ESOPHAGOGASTRODUODENOSCOPY N/A 01/10/2014   Procedure: ESOPHAGOGASTRODUODENOSCOPY (EGD);  Surgeon: Theda Belfast, MD;  Location: [facility] ENDOSCOPY;  Service: Endoscopy;  Laterality: N/A;  . FLEXIBLE SIGMOIDOSCOPY N/A 09/19/2015   Procedure: FLEXIBLE SIGMOIDOSCOPY;  Surgeon: Jeani Hawking, MD;  Location: [facility] ENDOSCOPY;  Service: Endoscopy;  Laterality: N/A;  .  FLEXIBLE SIGMOIDOSCOPY N/A 06/03/2017   Procedure: FLEXIBLE SIGMOIDOSCOPY;  Surgeon: Jeani Hawking, MD;  Location: [facility] ENDOSCOPY;  Service: Endoscopy;  Laterality: N/A;  . FLEXIBLE SIGMOIDOSCOPY N/A 01/06/2018   Procedure: FLEXIBLE SIGMOIDOSCOPY;  Surgeon: Jeani Hawking, MD;  Location: [facility] ENDOSCOPY;  Service: Endoscopy;  Laterality: N/A;  . GIVENS CAPSULE STUDY N/A 01/10/2014   Procedure: GIVENS CAPSULE STUDY;  Surgeon: Theda Belfast, MD;  Location: [facility] ENDOSCOPY;  Service: Endoscopy;  Laterality: N/A;  . HOT HEMOSTASIS N/A 09/19/2015   Procedure: HOT HEMOSTASIS (ARGON PLASMA COAGULATION/BICAP);  Surgeon: Jeani Hawking, MD;  Location: [facility] ENDOSCOPY;  Service: Endoscopy;  Laterality: N/A;  . HOT HEMOSTASIS N/A 06/03/2017   Procedure: HOT HEMOSTASIS (ARGON PLASMA COAGULATION/BICAP);  Surgeon: Jeani Hawking, MD;  Location: [facility] ENDOSCOPY;  Service: Endoscopy;  Laterality: N/A;  . PROSTATE BIOPSY  03/05/14   Gleason 7, vol 45 gm    Social:  Lives With:Girlfriend Occupation:Retired, helps at a church Support:Family support with daughter Level of Function:Independent ADLs and iADLs PCP:Dr. Morene Crocker, MD at [facility] Substances:Former Tobacco 15 pack years, Former daily alcohol use (sober for 15 years), former crack cocaine and marijuana use, sober since 2009  Family History:    Allergies: Allergies as of 06/10/2023  . (No Known Allergies)    Review of Systems: A complete ROS was negative except as per HPI.   OBJECTIVE:   Physical Exam: Blood pressure (!) 162/91, pulse 69, temperature 97.8 F (36.6 C), temperature source Oral, resp. rate 13, height 5\' 10"  (1.778 m), weight 81.6 kg, SpO2 100%.  Constitutional: well-appearing *** sitting in ***, in no acute distress HENT: normocephalic atraumatic, mucous membranes moist Cardiovascular: regular rate and rhythm, no m/r/g, *** JVD Pulmonary/Chest: normal work of breathing on room air, lungs clear to auscultation bilaterally. ***crackles   Abdominal: soft, non-tender, non-distended. ***fluid wave ***asterixis Neurological: alert & oriented x 3 MSK: no gross abnormalities. ***pitting edema Skin: warm and dry Psych: Normal mood and affect  Labs: CBC    Component Value Date/Time   WBC 8.9 06/10/2023 1948   RBC 2.10 (L) 06/10/2023 1948   HGB 6.0 (LL) 06/10/2023 1948   HGB 10.0 (L) 10/20/2022 1026   HCT 19.1 (L) 06/10/2023 1948   HCT 30.7 (L) 10/20/2022 1026   PLT 341 06/10/2023 1948   PLT 322 10/20/2022 1026   MCV 91.0 06/10/2023 1948   MCV 87 10/20/2022 1026   MCH 28.6 06/10/2023 1948   MCHC 31.4 06/10/2023 1948   RDW 18.8 (H) 06/10/2023 1948   RDW 13.8 10/20/2022 1026   LYMPHSABS 1.2 06/10/2023 1238   LYMPHSABS 1.3 04/10/2019 1006   MONOABS 1.2 (H) 06/10/2023 1238   EOSABS 0.0 06/10/2023 1238   EOSABS 0.2 04/10/2019 1006   BASOSABS 0.0 06/10/2023 1238   BASOSABS 0.0 04/10/2019 1006     CMP     Component Value Date/Time   NA 139 06/10/2023 1948   NA 137 03/19/2022 0933   K 3.8 06/10/2023 1948   CL 99 06/10/2023 1948   CO2 25 06/10/2023 1948  GLUCOSE 223 (H) 06/10/2023 1948   BUN 17 06/10/2023 1948   BUN 10 03/19/2022 0933   CREATININE 0.77 06/10/2023 1948   CALCIUM 8.5 (L) 06/10/2023 1948   PROT 6.3 04/10/2019 1006   ALBUMIN 3.9 04/10/2019 1006   AST 19 04/10/2019 1006   ALT 25 04/10/2019 1006   ALKPHOS 39 04/10/2019 1006   BILITOT <0.2 04/10/2019 1006   GFRNONAA >60 06/10/2023 1948   GFRAA 103 12/18/2019 0944    Imaging: DG Chest 2 View  Result Date: 06/10/2023 CLINICAL DATA:  Shortness of breath. EXAM: CHEST - 2 VIEW COMPARISON:  December 06, 2014 FINDINGS: The heart size and mediastinal contours are within normal limits. Very mild focal scarring and/or atelectasis is seen along the periphery of the mid right lung. There is no evidence of an acute infiltrate, pleural effusion or pneumothorax. Multiple chronic left-sided rib fractures are noted. Multilevel degenerative changes seen throughout the  thoracic spine. IMPRESSION: Very mild mid right lung focal scarring and/or atelectasis. Correlation with nonemergent chest CT is recommended to exclude the presence of a small pulmonary nodule. Electronically Signed   By: Aram Candela M.D.   On: 06/10/2023 20:46      EKG: personally reviewed my interpretation is***. Prior EKG***  ASSESSMENT & PLAN:   Assessment & Plan by Problem: Principal Problem:   Symptomatic anemia   ANDRE VALLEY is a 72 y.o. person living with a history of *** who presented with *** and admitted for *** on hospital day 0  Symptomatic anemia   2.  3.  4.  5.  6.   Diet: {NAMES:3044014::"Normal","Heart Healthy","Carb-Modified","Renal","Carb/Renal","NPO","TPN","Tube Feeds"} VTE: {NAMES:3044014::"Heparin","Enoxaparin","SCDs","NOAC","None"} Code: Full  Prior to Admission Living Arrangement: Home, living girlfriend  Anticipated Discharge Location: Home Barriers to Discharge: Medical Treatment   Dispo: Admit patient to Observation with expected length of stay less than 2 midnights.  Signed:   Morene Crocker, MD Internal Medicine Resident PGY-2 06/10/2023, 11:20 PM   Please contact the on call pager after 5 pm and on weekends at [phone].

## 2023-06-10 NOTE — ED Triage Notes (Signed)
Pt arrives to ED c/o abnormal hemoglobin of 5 taken at urgent care. Pt endorses SOB and black tarry stools since Monday which he reports have gotten better. Pt is not on thinners and has hx of receiving transfusions in past

## 2023-06-10 NOTE — ED Triage Notes (Signed)
Pt c/o SOB, bilateral leg swelling, fatigue, and black stools since Monday. Pt taking in complete sentences.

## 2023-06-10 NOTE — Telephone Encounter (Signed)
Critical lab value 5.8, RN notifed this Clinical research associate of result. This Clinical research associate advised RN Merrit to call patient to direct him to the ED as his hemoglobin is critical and he will likely need a blood transfusion.

## 2023-06-10 NOTE — Discharge Instructions (Signed)
Restart blood pressure medicine as I have reviewed your chart your doctor intended you to continue taking your amlodipine.  Also I am getting the blood work to check your hemoglobin level to see if your anemia has worsened.  I will call you the phone number you provided to notify you of your results.

## 2023-06-10 NOTE — ED Provider Notes (Signed)
William Jones    CSN: [id number] Arrival date & time: 06/10/23  0911      History   Chief Complaint Chief Complaint  Patient presents with   Shortness of Breath    HPI William Jones is a 72 y.o. male with a history of Type 2 Diabetes, Iron Deficiency anemia, hypertension, and prostate cancer with bone mets.  Patient presents today with a complaint of fatigue, dark stools, and bilateral leg swelling x 4 days.  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  He reports he had not been taking his diabetes medication and was concerned this may be the cause of his symptoms so improved his eating habits this weekend restarted taking metformin.  He reports he has not been taking his blood pressure medication because he recalls his PCP telling him he did not need to take the medication.  On chart review patient had medications refilled at his last annual physical exam visit.  On arrival blood pressure was initially elevated.  BP recheck and reduce to 140s over 80s.  Denies any upper respiratory symptoms.  Patient has an extensive history of bleeding from the rectum and lower GI bleed.  On chart review patient's last hemoglobin in January was 10.0 on chart review. Patient reports his blood has not been rechecked since that time and he has no follow-up visit scheduled with PCP. Past Medical History:  Diagnosis Date   Angiodysplasia of colon with hemorrhage 10/14/2020   Duodenitis    Essential hypertension 07/16/2015   Fatigue 11/06/2020   Glaucoma    H/O: substance abuse (HCC)    Hemorrhage of rectum and anus 10/14/2020   History of frostbite    History of prostate cancer 03/20/2014   Lost to f/u with [facility] in the past 2011 with elevated PSA >30 at that time.  Saw [facility] (Dr. Isabel Caprice) on 03/13/14. Cancer Noted 03/13/14 office visit.  Gleason score 7. Plan CT Ab/pelvis with contrast, bone scan    History of radiation therapy nov 2015 to feb 2016   Hyperlipidemia 11/20/2019   Iron deficiency anemia    Malignant tumor of prostate (HCC) 10/14/2020   Peripheral neuropathy    Prostate cancer (HCC) 03/05/14   Gleason 7, volume 45 gm   Rash and nonspecific skin eruption 08/04/2017   Rectal mass 09/15/2016   S/P radiation therapy 09/18/14-11/15/14   prostate/ 7800Gy/40sessions   Thrombocytopenia [facility])     Patient Active Problem List   Diagnosis Date Noted   GI bleeding 06/11/2023   Symptomatic anemia 06/10/2023   Rash 07/07/2021   First degree hemorrhoids 10/14/2020   Thrombosed hemorrhoids 10/14/2020   Type 2 diabetes mellitus (HCC) 12/18/2019   Hyperlipidemia 11/20/2019   Prostate cancer metastatic to bone (HCC) 12/29/2018   Glaucoma 08/21/2018   Leukopenia 04/20/2017   Healthcare maintenance 06/23/2016   Essential hypertension 07/16/2015   Erectile dysfunction 01/23/2014   Iron deficiency anemia 01/08/2014    Past Surgical History:  Procedure Laterality Date   COLONOSCOPY N/A 01/10/2014   Procedure: COLONOSCOPY;  Surgeon: Theda Belfast, MD;  Location: [facility] ENDOSCOPY;  Service: Endoscopy;  Laterality: N/A;   ESOPHAGOGASTRODUODENOSCOPY N/A 01/10/2014   Procedure: ESOPHAGOGASTRODUODENOSCOPY (EGD);  Surgeon: Theda Belfast, MD;  Location: [facility] ENDOSCOPY;  Service: Endoscopy;  Laterality: N/A;   FLEXIBLE SIGMOIDOSCOPY N/A 09/19/2015   Procedure: FLEXIBLE SIGMOIDOSCOPY;  Surgeon:  Jeani Hawking, MD;  Location: [facility] ENDOSCOPY;  Service: Endoscopy;  Laterality: N/A;   FLEXIBLE SIGMOIDOSCOPY N/A 06/03/2017   Procedure: FLEXIBLE SIGMOIDOSCOPY;  Surgeon: Jeani Hawking, MD;  Location: [facility] ENDOSCOPY;  Service: Endoscopy;  Laterality: N/A;   FLEXIBLE SIGMOIDOSCOPY N/A 01/06/2018   Procedure: FLEXIBLE SIGMOIDOSCOPY;  Surgeon: Jeani Hawking, MD;  Location: [facility] ENDOSCOPY;  Service: Endoscopy;  Laterality: N/A;   GIVENS CAPSULE STUDY N/A 01/10/2014    Procedure: GIVENS CAPSULE STUDY;  Surgeon: Theda Belfast, MD;  Location: [facility] ENDOSCOPY;  Service: Endoscopy;  Laterality: N/A;   HOT HEMOSTASIS N/A 09/19/2015   Procedure: HOT HEMOSTASIS (ARGON PLASMA COAGULATION/BICAP);  Surgeon: Jeani Hawking, MD;  Location: [facility] ENDOSCOPY;  Service: Endoscopy;  Laterality: N/A;   HOT HEMOSTASIS N/A 06/03/2017   Procedure: HOT HEMOSTASIS (ARGON PLASMA COAGULATION/BICAP);  Surgeon: Jeani Hawking, MD;  Location: [facility] ENDOSCOPY;  Service: Endoscopy;  Laterality: N/A;   PROSTATE BIOPSY  03/05/14   Gleason 7, vol 45 gm       Home Medications    Prior to Admission medications   Medication Sig Start Date End Date Taking? Authorizing Provider  acetaminophen (TYLENOL) 500 MG tablet Take 2 tablets (1,000 mg total) by mouth every 6 (six) hours as needed for mild pain or moderate pain. 05/01/21   Fayrene Helper, PA-C  amLODipine (NORVASC) 5 MG tablet Take 1 tablet (5 mg total) by mouth daily. 06/10/23 06/09/24  Bing Neighbors, NP  CALCIUM CITRATE PO Take 600 mg by mouth daily.    [provider]  cholecalciferol (VITAMIN D3) 10 MCG (400 UNIT) TABS tablet Take 1,000 Units by mouth daily.    [provider]  dorzolamide (TRUSOPT) 2 % ophthalmic solution Place 1 drop into the right eye 2 (two) times daily. 11/11/18   [provider]  Ferrous Sulfate (IRON PO) Take 1 tablet by mouth daily.    [provider]  metFORMIN (GLUCOPHAGE-XR) 500 MG 24 hr tablet Take 1 tablet (500 mg total) by mouth 2 (two) times daily with a meal. 10/20/22 10/15/23  Morene Crocker, MD  Multiple Vitamins-Minerals (ONE-A-DAY PROACTIVE 65+) TABS Take 1 tablet by mouth daily.    [provider]  vitamin E 180 MG (400 UNITS) capsule Take 400 Units by mouth daily.    [provider]  XTANDI 40 MG capsule Take 160 mg by mouth at bedtime. 11/19/19   [provider]    Family History Family History  Problem Relation Age of Onset   Kidney  failure Brother 54       has been on HD since age 5    Edema Mother        Legs   Arthritis Sister        knees   Stroke Maternal Uncle        70s-80s   Cancer Neg Hx     Social History Social History   Tobacco Use   Smoking status: Former    Current packs/day: 0.00    Average packs/day: 0.5 packs/day for 30.0 years (15.0 ttl pk-yrs)    Types: Cigarettes    Start date: 10/04/1977    Quit date: 10/05/2007    Years since quitting: 15.6   Smokeless tobacco: Never  Vaping Use   Vaping status: Never Used  Substance Use Topics   Alcohol use: Not Currently    Alcohol/week: 0.0 standard drinks of alcohol    Comment: no alcohol for five years (per conversation 2020)   Drug use: No  Types: "Crack" cocaine, Marijuana    Comment: per Epic note 2009hx of crack cocaine use 12 years ago per pt, no current marijuana use per pt     Allergies   Patient has no known allergies.   Review of Systems Review of Systems  Respiratory:  Positive for shortness of breath.      Physical Exam Triage Vital Signs ED Triage Vitals  Encounter Vitals Group     BP 06/10/23 1107 (!) 195/89     Systolic BP Percentile --      Diastolic BP Percentile --      Pulse Rate 06/10/23 1107 96     Resp 06/10/23 1107 20     Temp 06/10/23 1107 98.2 F (36.8 C)     Temp Source 06/10/23 1107 Oral     SpO2 06/10/23 1107 98 %     Weight --      Height --      Head Circumference --      Peak Flow --      Pain Score 06/10/23 1108 0     Pain Loc --      Pain Education --      Exclude from Growth Chart --    No data found.  Updated Vital Signs BP (!) 195/89 (BP Location: Left Arm) Comment: states hasn't taking his b/p meds for a month.  Pulse 96   Temp 98.2 F (36.8 C) (Oral)   Resp 20   SpO2 98%   Visual Acuity Right Eye Distance:   Left Eye Distance:   Bilateral Distance:    Right Eye Near:   Left Eye Near:    Bilateral Near:     Physical Exam Constitutional:      Appearance: Normal  appearance.     Comments: Chronically ill appearing   HENT:     Head: Normocephalic.  Eyes:     Extraocular Movements: Extraocular movements intact.     Conjunctiva/sclera: Conjunctivae normal.     Pupils: Pupils are equal, round, and reactive to light.  Cardiovascular:     Rate and Rhythm: Normal rate and regular rhythm.  Pulmonary:     Effort: Pulmonary effort is normal. No respiratory distress.     Breath sounds: Normal breath sounds. No stridor. No wheezing, rhonchi or rales.  Chest:     Chest wall: No tenderness.  Musculoskeletal:     Right lower leg: Edema present.     Left lower leg: 1+ Edema present.  Skin:    General: Skin is cool and dry.     Capillary Refill: Capillary refill takes less than 2 seconds.     Coloration: Skin is ashen.  Neurological:     General: No focal deficit present.     Mental Status: He is alert.      UC Treatments / Results  Labs (all labs ordered are listed, but only abnormal results are displayed) Labs Reviewed  CBC WITH DIFFERENTIAL/PLATELET - Abnormal; Notable for the following components:      Result Value   RBC 2.05 (*)    Hemoglobin 5.8 (*)    HCT 18.6 (*)    RDW 18.5 (*)    nRBC 6.0 (*)    Monocytes Absolute 1.2 (*)    Abs Immature Granulocytes 0.11 (*)    All other components within normal limits  POCT URINALYSIS DIP (MANUAL ENTRY) - Abnormal; Notable for the following components:   Clarity, UA cloudy (*)    All other components within normal  limits  POCT FASTING CBG [facility] MANUAL ENTRY - Abnormal; Notable for the following components:   POCT Glucose ([facility]) 147 (*)    All other components within normal limits    EKG   Radiology DG Chest 2 View  Result Date: 06/10/2023 CLINICAL DATA:  Shortness of breath. EXAM: CHEST - 2 VIEW COMPARISON:  December 06, 2014 FINDINGS: The heart size and mediastinal contours are within normal limits. Very mild focal scarring and/or atelectasis is seen along the periphery of the mid right lung.  There is no evidence of an acute infiltrate, pleural effusion or pneumothorax. Multiple chronic left-sided rib fractures are noted. Multilevel degenerative changes seen throughout the thoracic spine. IMPRESSION: Very mild mid right lung focal scarring and/or atelectasis. Correlation with nonemergent chest CT is recommended to exclude the presence of a small pulmonary nodule. Electronically Signed   By: Aram Candela M.D.   On: 06/10/2023 20:46    Procedures Procedures (including critical care time)  Medications Ordered in UC Medications - No data to display  Initial Impression / Assessment and Plan / UC Course  I have reviewed the triage vital signs and the nursing notes.  Pertinent labs & imaging results that were available during my care of the patient were reviewed by me and considered in my medical decision making (see chart for details).  Clinical Course as of 06/11/23 1035  Fri Jun 10, 2023  1236 BP(!): 195/89 [GG]    Clinical Course User Index [GG] [place], [place] N, FNP   Concerned that shortness of breath may be related to worsening of chronic anemia.  Obtaining a stat CBC.  Advised patient I will contact him by phone if his results are abnormal.  Patient also was restarted on half dose of his blood pressure medicine given he has not taken it in several months agreed to refill amlodipine at 5 mg and have him follow-up with primary care.  Patient verbalized understanding and agreement with plan today.  Final Clinical Impressions(s) / UC Diagnoses   Final diagnoses:  SOB (shortness of breath)  Other fatigue  Essential hypertension     Discharge Instructions      Restart blood pressure medicine as I have reviewed your chart your doctor intended you to continue taking your amlodipine.  Also I am getting the blood work to check your hemoglobin level to see if your anemia has worsened.  I will call you the phone number you provided to notify you of your results.     ED  Prescriptions     Medication Sig Dispense Auth. Provider   amLODipine (NORVASC) 5 MG tablet Take 1 tablet (5 mg total) by mouth daily. 90 tablet Bing Neighbors, NP      PDMP not reviewed this encounter.   Bing Neighbors, NP 06/11/23 1050

## 2023-06-11 ENCOUNTER — Encounter (HOSPITAL_COMMUNITY): Admission: EM | Disposition: A | Discharge status: Home / Self Care | Payer: Self-pay | Source: Home / Self Care

## 2023-06-11 ENCOUNTER — Observation Stay (HOSPITAL_BASED_OUTPATIENT_CLINIC_OR_DEPARTMENT_OTHER): Payer: 59 | Admitting: Certified Registered Nurse Anesthetist

## 2023-06-11 ENCOUNTER — Observation Stay (HOSPITAL_COMMUNITY): Payer: 59 | Admitting: Certified Registered Nurse Anesthetist

## 2023-06-11 DIAGNOSIS — K250 Acute gastric ulcer with hemorrhage: Secondary | ICD-10-CM

## 2023-06-11 DIAGNOSIS — D649 Anemia, unspecified: Secondary | ICD-10-CM | POA: Diagnosis not present

## 2023-06-11 DIAGNOSIS — K259 Gastric ulcer, unspecified as acute or chronic, without hemorrhage or perforation: Principal | ICD-10-CM

## 2023-06-11 DIAGNOSIS — K922 Gastrointestinal hemorrhage, unspecified: Secondary | ICD-10-CM | POA: Diagnosis not present

## 2023-06-11 DIAGNOSIS — K449 Diaphragmatic hernia without obstruction or gangrene: Secondary | ICD-10-CM | POA: Diagnosis not present

## 2023-06-11 HISTORY — PX: BIOPSY: SHX5522

## 2023-06-11 HISTORY — PX: ESOPHAGOGASTRODUODENOSCOPY (EGD) WITH PROPOFOL: SHX5813

## 2023-06-11 LAB — FERRITIN: Ferritin: 19 ng/mL — ABNORMAL LOW (ref 24–336)

## 2023-06-11 LAB — VITAMIN B12: Vitamin B-12: 835 pg/mL (ref 180–914)

## 2023-06-11 LAB — IRON AND TIBC
Iron: 33 ug/dL — ABNORMAL LOW (ref 45–182)
Saturation Ratios: 9 % — ABNORMAL LOW (ref 17.9–39.5)
TIBC: 367 ug/dL (ref 250–450)
UIBC: 334 ug/dL

## 2023-06-11 LAB — CBC
HCT: 21.5 % — ABNORMAL LOW (ref 39.0–52.0)
Hemoglobin: 6.7 g/dL — CL (ref 13.0–17.0)
MCH: 28.9 pg (ref 26.0–34.0)
MCHC: 31.2 g/dL (ref 30.0–36.0)
MCV: 92.7 fL (ref 80.0–100.0)
Platelets: 291 10*3/uL (ref 150–400)
RBC: 2.32 MIL/uL — ABNORMAL LOW (ref 4.22–5.81)
RDW: 18 % — ABNORMAL HIGH (ref 11.5–15.5)
WBC: 7.5 10*3/uL (ref 4.0–10.5)
nRBC: 5.2 % — ABNORMAL HIGH (ref 0.0–0.2)

## 2023-06-11 LAB — COMPREHENSIVE METABOLIC PANEL
ALT: 40 U/L (ref 0–44)
AST: 30 U/L (ref 15–41)
Albumin: 2.8 g/dL — ABNORMAL LOW (ref 3.5–5.0)
Alkaline Phosphatase: 41 U/L (ref 38–126)
Anion gap: 8 (ref 5–15)
BUN: 12 mg/dL (ref 8–23)
CO2: 27 mmol/L (ref 22–32)
Calcium: 7.7 mg/dL — ABNORMAL LOW (ref 8.9–10.3)
Chloride: 100 mmol/L (ref 98–111)
Creatinine, Ser: 0.69 mg/dL (ref 0.61–1.24)
GFR, Estimated: >60 mL/min (ref >60)
Glucose, Bld: 196 mg/dL — ABNORMAL HIGH (ref 70–99)
Potassium: 4.4 mmol/L (ref 3.5–5.1)
Sodium: 135 mmol/L (ref 135–145)
Total Bilirubin: 0.9 mg/dL (ref 0.3–1.2)
Total Protein: 5.8 g/dL — ABNORMAL LOW (ref 6.5–8.1)

## 2023-06-11 LAB — PROTIME-INR
INR: 1.1 (ref 0.8–1.2)
Prothrombin Time: 13.9 s (ref 11.4–15.2)

## 2023-06-11 LAB — HEMOGLOBIN AND HEMATOCRIT, BLOOD
HCT: 25.1 % — ABNORMAL LOW (ref 39.0–52.0)
Hemoglobin: 8.2 g/dL — ABNORMAL LOW (ref 13.0–17.0)

## 2023-06-11 LAB — APTT: aPTT: 22 s — ABNORMAL LOW (ref 24–36)

## 2023-06-11 LAB — GLUCOSE, CAPILLARY
Glucose-Capillary: 151 mg/dL — ABNORMAL HIGH (ref 70–99)
Glucose-Capillary: 213 mg/dL — ABNORMAL HIGH (ref 70–99)

## 2023-06-11 LAB — PREPARE RBC (CROSSMATCH)

## 2023-06-11 LAB — HIV ANTIBODY (ROUTINE TESTING W REFLEX): HIV Screen 4th Generation wRfx: NONREACTIVE

## 2023-06-11 LAB — FOLATE: Folate: 13.9 ng/mL (ref >5.9)

## 2023-06-11 SURGERY — ESOPHAGOGASTRODUODENOSCOPY (EGD) WITH PROPOFOL
Anesthesia: Monitor Anesthesia Care

## 2023-06-11 MED ORDER — ENZALUTAMIDE 40 MG PO CAPS
160.0000 mg | ORAL_CAPSULE | Freq: Every evening | ORAL | Status: DC
Start: 2023-06-11 — End: 2023-06-11

## 2023-06-11 MED ORDER — PROPOFOL 500 MG/50ML IV EMUL
INTRAVENOUS | Status: DC | PRN
  Administered 2023-06-11: 180 ug/kg/min via INTRAVENOUS

## 2023-06-11 MED ORDER — SODIUM CHLORIDE 0.9% IV SOLUTION: Freq: Once | INTRAVENOUS | Status: AC

## 2023-06-11 MED ORDER — LACTATED RINGERS IV SOLN
INTRAVENOUS | Status: AC | PRN
  Administered 2023-06-11: 10 mL/h via INTRAVENOUS

## 2023-06-11 MED ORDER — IRON SUCROSE 500 MG IVPB - SIMPLE MED
500.0000 mg | Freq: Once | INTRAVENOUS | Status: AC
  Administered 2023-06-11: 500 mg via INTRAVENOUS
  Filled 2023-06-11: qty 275

## 2023-06-11 MED ORDER — ENZALUTAMIDE 40 MG PO CAPS
160.0000 mg | ORAL_CAPSULE | Freq: Every evening | ORAL | Status: DC
  Administered 2023-06-11: 160 mg via ORAL
  Filled 2023-06-11 (×2): qty 4

## 2023-06-11 MED ORDER — LIDOCAINE 2% (20 MG/ML) 5 ML SYRINGE
INTRAMUSCULAR | Status: DC | PRN
  Administered 2023-06-11: 40 mg via INTRAVENOUS

## 2023-06-11 MED ORDER — SODIUM CHLORIDE 0.9 % IV SOLN: INTRAVENOUS | Status: DC

## 2023-06-11 MED ORDER — PROPOFOL 10 MG/ML IV BOLUS
INTRAVENOUS | Status: DC | PRN
  Administered 2023-06-11 (×3): 20 mg via INTRAVENOUS

## 2023-06-11 MED ORDER — AMLODIPINE BESYLATE 5 MG PO TABS
5.0000 mg | ORAL_TABLET | Freq: Every day | ORAL | Status: DC
  Administered 2023-06-11 – 2023-06-12 (×2): 5 mg via ORAL
  Filled 2023-06-11 (×2): qty 1

## 2023-06-11 SURGICAL SUPPLY — 15 items

## 2023-06-11 NOTE — ED Notes (Signed)
Pt transported to Endo. Pt provided belongings bag.

## 2023-06-11 NOTE — Discharge Summary (Shared)
Name: William Jones MRN: [id number] DOB: 04/20/1951 72 y.o. PCP: Morene Crocker, MD  Date of Admission: 06/10/2023  7:30 PM Date of Discharge: 06/12/23 Attending Physician: Dr.  Dickie La  Discharge Diagnosis: Principal Problem:   Symptomatic anemia Active Problems:   Iron deficiency anemia   Melena   Essential hypertension   GI bleeding   Acute gastric ulcer with hemorrhage    Discharge Medications: Allergies as of 06/11/2023   No Known Allergies   Med Rec must be completed prior to using this [facility]***       Disposition and follow-up:   [name] was discharged from [facility] in Stable condition.  At the hospital follow up visit please address:  1.  Follow-up:  a. Peptic Ulcer disease, improvement of GERD symptoms and melena    b. Normocytic anemia,    c. Abnormal finding on CXR, please   2.  Labs / imaging needed at time of follow-up: chest CT to rule out small pulmonary nodule and CBC +/- iron deficiency labs.   3.  Pending labs/ test needing follow-up: none  4.  Medication Changes  Started Pantoprazole 40 mg twice daily for one month for PUD  Follow-up Appointments: [facility] Course by problem list: William Jones is a 72 year old male with a past medical history of metastatic prostate cancer to the bone s/p radiation and on antiandrogenic therapy c/b prior history of radiation proctitis, angiodysplasia of colon with hemorrhage, duodenitis, IDA, HTN, T2DM who presented with fatigue and melena and admitted for symptomatic anemia on hospital day 1.    PUD  Symptomatic anemia History of Iron Deficiency Anemia  Chronic normocytic anemia PUD diagnosed by EGD and bleeding stopped with PPI therapy. Pantoprazole 40 mg twice daily for one month.    Abnormal finding on CXR Admission CXR with very mild mid right lung focal scarring and/or atelectasis. Outpatient correlation with nonemergent chest CT is  recommended to exclude the presence of a small pulmonary nodule  Discharge Subjective: ***  Discharge Exam:   BP (!) 147/67 (BP Location: Left Arm)   Pulse 61   Temp (!) 97.2 F (36.2 C)   Resp 17   Ht 5\' 10"  (1.778 m)   Wt 81.6 kg   SpO2 99%   BMI 25.81 kg/m  Constitutional: well-appearing *** sitting in ***, in no acute distress HENT: normocephalic atraumatic, mucous membranes moist Eyes: conjunctiva non-erythematous Neck: supple Cardiovascular: regular rate and rhythm, no m/r/g Pulmonary/Chest: normal work of breathing on room air, lungs clear to auscultation bilaterally Abdominal: soft, non-tender, non-distended MSK: normal bulk and tone Neurological: alert & oriented x 3, 5/5 strength in bilateral upper and lower extremities, normal gait Skin: warm and dry Psych: ***   Pertinent Labs, Studies, and Procedures:     Latest Ref Rng & Units 06/11/2023    8:27 AM 06/11/2023    2:03 AM 06/10/2023    7:48 PM  CBC  WBC 4.0 - 10.5 K/uL  7.5  8.9   Hemoglobin 13.0 - 17.0 g/dL 8.2  6.7  6.0   Hematocrit 39.0 - 52.0 % 25.1  21.5  19.1   Platelets 150 - 400 K/uL  291  341        Latest Ref Rng & Units 06/11/2023    2:03 AM 06/10/2023    7:48 PM 03/19/2022    9:33 AM  CMP  Glucose 70 - 99 mg/dL 841  324  401   BUN  8 - 23 mg/dL 12  17  10    Creatinine 0.61 - 1.24 mg/dL 7.82  9.56  2.13   Sodium 135 - 145 mmol/L 135  139  137   Potassium 3.5 - 5.1 mmol/L 4.4  3.8  4.7   Chloride 98 - 111 mmol/L 100  99  101   CO2 22 - 32 mmol/L 27  25  22    Calcium 8.9 - 10.3 mg/dL 7.7  8.5  9.4   Total Protein 6.5 - 8.1 g/dL 5.8     Total Bilirubin 0.3 - 1.2 mg/dL 0.9     Alkaline Phos 38 - 126 U/L 41     AST 15 - 41 U/L 30     ALT 0 - 44 U/L 40       DG Chest 2 View  Result Date: 06/10/2023 CLINICAL DATA:  Shortness of breath. EXAM: CHEST - 2 VIEW COMPARISON:  December 06, 2014 FINDINGS: The heart size and mediastinal contours are within normal limits. Very mild focal scarring and/or atelectasis  is seen along the periphery of the mid right lung. There is no evidence of an acute infiltrate, pleural effusion or pneumothorax. Multiple chronic left-sided rib fractures are noted. Multilevel degenerative changes seen throughout the thoracic spine. IMPRESSION: Very mild mid right lung focal scarring and/or atelectasis. Correlation with nonemergent chest CT is recommended to exclude the presence of a small pulmonary nodule. Electronically Signed   By: Aram Candela M.D.   On: 06/10/2023 20:46     Discharge Instructions: 1) Take Pantoprazole two times a day for 1 month. Once in morning and once at night.  2) Okay to start eating again once leave hospital.  3) Reach out to primary care doctor if feeling fatigued again or having return of black stools.    Signed: Meryl Dare, MD 06/11/2023, 2:10 PM   Pager: [phone]

## 2023-06-11 NOTE — Discharge Instructions (Addendum)
Hi William Jones It was a pleasure taking care of you at [facility]. You were admitted for symptomatic anemia and treated for peptic ulcer disease. We are discharging you home now that you are doing better. Please follow the instructions below.  1) Take Pantoprazole two times a day for 1 month. Once in morning and once at night.  2) Avoid using NSAID (e.g. ibuprofen, naproxen, mobix, etc). Avoid using Goody and BC powder. 2) Okay to start eating again once leave hospital.  3) Reach out to primary care doctor if feeling fatigued again or having return of black stools.   Take care,  Dr. Benito Mccreedy

## 2023-06-11 NOTE — ED Notes (Addendum)
NAD noted

## 2023-06-11 NOTE — Care Management Obs Status (Signed)
MEDICARE OBSERVATION STATUS NOTIFICATION   Patient Details  Name: William Jones MRN: [id number] Date of Birth: 01/23/1951   Medicare Observation Status Notification Given:  Yes    Ronny Bacon, RN 06/11/2023, 4:23 PM

## 2023-06-11 NOTE — Anesthesia Preprocedure Evaluation (Signed)
Anesthesia Evaluation  Patient identified by MRN, date of birth, ID band Patient awake    Reviewed: Allergy & Precautions, NPO status , Patient's Chart, lab work & pertinent test results, reviewed documented beta blocker date and time   History of Anesthesia Complications Negative for: history of anesthetic complications  Airway Mallampati: II  TM Distance: >3 FB Neck ROM: Limited    Dental no notable dental hx.    Pulmonary neg COPD, former smoker, neg PE   breath sounds clear to auscultation       Cardiovascular hypertension, (-) angina (-) CAD, (-) Past MI, (-) Cardiac Stents and (-) CABG (-) dysrhythmias  Rhythm:Regular Rate:Normal     Neuro/Psych neg Seizures  Neuromuscular disease    GI/Hepatic hiatal hernia,,,(+) neg Cirrhosis        Endo/Other  diabetes    Renal/GU Renal disease     Musculoskeletal   Abdominal   Peds  Hematology  (+) Blood dyscrasia, anemia Lower GIB s/p transfusion of multiple PRBCs   Anesthesia Other Findings   Reproductive/Obstetrics                              Anesthesia Physical Anesthesia Plan  ASA: 3  Anesthesia Plan: MAC   Post-op Pain Management:    Induction: Intravenous  PONV Risk Score and Plan: 1 and Ondansetron and Propofol infusion  Airway Management Planned:   Additional Equipment:   Intra-op Plan:   Post-operative Plan:   Informed Consent: I have reviewed the patients History and Physical, chart, labs and discussed the procedure including the risks, benefits and alternatives for the proposed anesthesia with the patient or authorized representative who has indicated his/her understanding and acceptance.     Dental advisory given  Plan Discussed with: CRNA  Anesthesia Plan Comments:          Anesthesia Quick Evaluation

## 2023-06-11 NOTE — Anesthesia Postprocedure Evaluation (Signed)
Anesthesia Post Note  Patient: William Jones [facility]  Procedure(s) Performed: ESOPHAGOGASTRODUODENOSCOPY (EGD) WITH PROPOFOL BIOPSY     Patient location during evaluation: PACU Anesthesia Type: MAC Level of consciousness: awake and alert Pain management: pain level controlled Vital Signs Assessment: post-procedure vital signs reviewed and stable Respiratory status: spontaneous breathing, nonlabored ventilation, respiratory function stable and patient connected to nasal cannula oxygen Cardiovascular status: stable and blood pressure returned to baseline Postop Assessment: no apparent nausea or vomiting Anesthetic complications: no   No notable events documented.  Last Vitals:  Vitals:   06/11/23 1315 06/11/23 1330  BP: 139/75 (!) 147/67  Pulse: 60 61  Resp: 16 17  Temp:  (!) 36.2 C  SpO2: 100% 99%    Last Pain:  Vitals:   06/11/23 1330  TempSrc:   PainSc: 0-No pain                 McKinney Nation

## 2023-06-11 NOTE — Transfer of Care (Signed)
Immediate Anesthesia Transfer of Care Note  Patient: William Jones [facility]  Procedure(s) Performed: ESOPHAGOGASTRODUODENOSCOPY (EGD) WITH PROPOFOL  Patient Location: PACU  Anesthesia Type:MAC  Level of Consciousness: sedated  Airway & Oxygen Therapy: Patient Spontanous Breathing and Patient connected to nasal cannula oxygen  Post-op Assessment: Report given to RN and Post -op Vital signs reviewed and stable  Post vital signs: Reviewed and stable  Last Vitals:  Vitals Value Taken Time  BP    Temp    Pulse    Resp    SpO2      Last Pain:  Vitals:   06/11/23 1209  TempSrc: Temporal  PainSc: 0-No pain         Complications: No notable events documented.

## 2023-06-11 NOTE — Consult Note (Signed)
Referring Provider: Dr. Dickie Jones Primary Care Physician:  William Crocker, MD Primary Gastroenterologist:  Dr. Jeani Jones  Reason for Consultation: Anemia, melena  HPI: William Jones is a 72 y.o. male with a past medical history of hypertension, hyperlipidemia, peripheral neuropathy, remote alcohol use disorder (abstinent from alcohol x 20+ years), IDA, small bowel, hyperplastic colon polyp, colon and rectal AVMs, prostate cancer s/p radiation and seed implantation with radiation proctitis.  Patient presented to the ED 06/10/2023 due to experiencing progressive fatigue, bilateral lower extremity swelling and black stools x 4 days. Labs in the ED showed a WBC count of 10.0.  Hemoglobin 5.8 down from 10 seven months ago.  Hematocrit 18.6.  MCV 90.7.  Platelet 335.  BUN 17.  Creatinine 0.77.  Glucose 223.  FOBT positive.  He was transfused 1 unit of PRBCs -> Hg 6.7.  He received a second unit of PRBCs which completed at 6:24 AM, posttransfusion H&H ordered, not yet collected.  He was started on PPI IV twice daily.  No further melena since arriving to the ED.  Labs today: Normal LFTs.  BUN 12.  Creatinine 0.69.  Calcium 7.7.  Albumin 2.8.  INR 1.1.  Iron 33.  Saturation ratios 9.  TIBC 367.  Ferritin 19.  Vitamin B12 and folate levels ordered, not yet collected.  HIV nonreactive.  He developed noticeable fatigue with black thick stools on Monday, 06/06/2023 with leg swelling.  Previously was passing 2-3 normal brown formed stools daily.  No bright red rectal bleeding.  No associated chest pain, shortness of breath or dizziness.  He passed 2 black tarry stools yesterday with worsening fatigue so he presented to the ED as noted above.  No NSAID use.  He is not on any blood thinners.  He previously took oral iron daily but stopped it 6 months ago as instructed by his PCP.  He eats dinner late at night and goes to bed and awakens several nights weekly with indigestion.  He denies taking any  acid reducing medications.  No dysphagia.  He has intermittent lower abdominal gas discomfort when he does not pass a bowel movement.  He passed 2 black sticky stools yesterday, no further BM/melena since then.  No bright red rectal bleeding.  No history of cardiac or pulmonary disease.  History of IDA.  Admitted to the hospital 01/2014 with symptomatic anemia with a hemoglobin level of 3.3.  No overt GI bleeding.  He was transfused 3 units of PRBCs at that time.  EGD during this hospitalization was normal, colonoscopy showed a 3 mm polyp removed from the descending colon and a capsule endoscopy showed a small nonbleeding AVM, too distal to reach with a standard scope and was not thought to be the source of IDA.  He subsequently developed hematochezia and underwent a flexible sigmoidoscopy 09/2015 which showed a bleeding rectal AVM which was treated with APC, findings consistent with radiation proctitis.  Follow-up flexible sigmoidoscopy 05/2017 showed multiple colonic AVMs treated with APC.  Normal flexible sigmoidoscopies in 2019 and 2022.   I attempted to contact the patient's daughter William Jones per the patient's request at this time but she did not answer her phone.  GI PROCEDURES:  Flexible sigmoidoscopy 11/27/2020: Examined colon was normal  Flexible sigmoidoscopy 01/06/2018 secondary to hematochezia: Examined colon was normal  Flexible sigmoidoscopy 06/03/2017 secondary to hematochezia: - Multiple bleeding colonic angioectasias. Treated with a monopolar probe with successful hemostasis.  - No specimens collected.  Flexible sigmoidoscopy 09/19/2015 due to  hematochezia: FINDINGS: The prep for the rectum was excellent. On of the AVMs was bleeding, mildly. The majority of the AVMs were ablated with APC. Retroflexed views revealed no abnormalities outside of the radiation proctitis. IMPRESSION: Radiation proctitis s/p APC.  Givens capsule/06/2014: Capsule endo revealed a small nonbleeding AVM  that is too far distally to reach with standard endoscopes. I do not believe that this the source of the bleeding.   Colonoscopy 01/10/2014: 3 mm hyperplastic polyp removed from the descending colon  EGD 01/10/2014: Normal EGD Duodenal biopsies negative for celiac disease  Past Medical History:  Diagnosis Date   Angiodysplasia of colon with hemorrhage 10/14/2020   Duodenitis    Essential hypertension 07/16/2015   Fatigue 11/06/2020   Glaucoma    H/O: substance abuse (HCC)    Hemorrhage of rectum and anus 10/14/2020   History of frostbite    History of prostate cancer 03/20/2014   Lost to f/u with [facility] in the past 2011 with elevated PSA >30 at that time.  Saw [facility] (Dr. Isabel Jones) on 03/13/14. Cancer Noted 03/13/14 office visit. Gleason score 7. Plan CT Ab/pelvis with contrast, bone scan    History of radiation therapy nov 2015 to feb 2016   Hyperlipidemia 11/20/2019   Iron deficiency anemia    Malignant tumor of prostate (HCC) 10/14/2020   Peripheral neuropathy    Prostate cancer (HCC) 03/05/14   Gleason 7, volume 45 gm   Rash and nonspecific skin eruption 08/04/2017   Rectal mass 09/15/2016   S/P radiation therapy 09/18/14-11/15/14   prostate/ 7800Gy/40sessions   Thrombocytopenia (HCC)     Past Surgical History:  Procedure Laterality Date   COLONOSCOPY N/A 01/10/2014   Procedure: COLONOSCOPY;  Surgeon: William Belfast, MD;  Location: [facility] ENDOSCOPY;  Service: Endoscopy;  Laterality: N/A;   ESOPHAGOGASTRODUODENOSCOPY N/A 01/10/2014   Procedure: ESOPHAGOGASTRODUODENOSCOPY (EGD);  Surgeon: William Belfast, MD;  Location: [facility] ENDOSCOPY;  Service: Endoscopy;  Laterality: N/A;   FLEXIBLE SIGMOIDOSCOPY N/A 09/19/2015   Procedure: FLEXIBLE SIGMOIDOSCOPY;  Surgeon: William Hawking, MD;  Location: [facility] ENDOSCOPY;  Service: Endoscopy;  Laterality: N/A;   FLEXIBLE SIGMOIDOSCOPY N/A 06/03/2017   Procedure: FLEXIBLE SIGMOIDOSCOPY;  Surgeon: William Hawking, MD;  Location: [facility] ENDOSCOPY;  Service:  Endoscopy;  Laterality: N/A;   FLEXIBLE SIGMOIDOSCOPY N/A 01/06/2018   Procedure: FLEXIBLE SIGMOIDOSCOPY;  Surgeon: William Hawking, MD;  Location: [facility] ENDOSCOPY;  Service: Endoscopy;  Laterality: N/A;   GIVENS CAPSULE STUDY N/A 01/10/2014   Procedure: GIVENS CAPSULE STUDY;  Surgeon: William Belfast, MD;  Location: [facility] ENDOSCOPY;  Service: Endoscopy;  Laterality: N/A;   HOT HEMOSTASIS N/A 09/19/2015   Procedure: HOT HEMOSTASIS (ARGON PLASMA COAGULATION/BICAP);  Surgeon: William Hawking, MD;  Location: [facility] ENDOSCOPY;  Service: Endoscopy;  Laterality: N/A;   HOT HEMOSTASIS N/A 06/03/2017   Procedure: HOT HEMOSTASIS (ARGON PLASMA COAGULATION/BICAP);  Surgeon: William Hawking, MD;  Location: [facility] ENDOSCOPY;  Service: Endoscopy;  Laterality: N/A;   PROSTATE BIOPSY  03/05/14   Gleason 7, vol 45 gm    Prior to Admission medications   Medication Sig Start Date End Date Taking? Authorizing Provider  acetaminophen (TYLENOL) 500 MG tablet Take 2 tablets (1,000 mg total) by mouth every 6 (six) hours as needed for mild pain or moderate pain. 05/01/21  Yes Fayrene Helper, PA-C  amLODipine (NORVASC) 5 MG tablet Take 1 tablet (5 mg total) by mouth daily. 06/10/23 06/09/24 Yes Bing Neighbors, NP  CALCIUM CITRATE PO Take 600 mg by mouth daily.   Yes [provider]  cholecalciferol (VITAMIN D3) 10 MCG (400 UNIT) TABS tablet Take 1,000 Units by mouth daily.   Yes [provider]  dorzolamide (TRUSOPT) 2 % ophthalmic solution Place 1 drop into the right eye 2 (two) times daily. 11/11/18  Yes [provider]  Ferrous Sulfate (IRON PO) Take 1 tablet by mouth daily.   Yes [provider]  metFORMIN (GLUCOPHAGE-XR) 500 MG 24 hr tablet Take 1 tablet (500 mg total) by mouth 2 (two) times daily with a meal. 10/20/22 10/15/23 Yes William Crocker, MD  Multiple Vitamins-Minerals (ONE-A-DAY PROACTIVE 65+) TABS Take 1 tablet by mouth daily.   Yes [provider]  vitamin E 180 MG (400 UNITS) capsule  Take 400 Units by mouth daily.   Yes [provider]  XTANDI 40 MG capsule Take 160 mg by mouth at bedtime. 11/19/19  Yes [provider]    Current Facility-Administered Medications  Medication Dose Route Frequency Provider Last Rate Last Admin   acetaminophen (TYLENOL) tablet 650 mg  650 mg Oral Q6H PRN William Crocker, MD       Or   acetaminophen (TYLENOL) suppository 650 mg  650 mg Rectal Q6H PRN William Crocker, MD       pantoprazole (PROTONIX) injection 40 mg  40 mg Intravenous BID William Crocker, MD   40 mg at 06/11/23 0004   sodium chloride flush (NS) 0.9 % injection 3 mL  3 mL Intravenous Q12H William Crocker, MD   3 mL at 06/11/23 0003   Current Outpatient Medications  Medication Sig Dispense Refill   acetaminophen (TYLENOL) 500 MG tablet Take 2 tablets (1,000 mg total) by mouth every 6 (six) hours as needed for mild pain or moderate pain. 30 tablet 0   amLODipine (NORVASC) 5 MG tablet Take 1 tablet (5 mg total) by mouth daily. 90 tablet 0   CALCIUM CITRATE PO Take 600 mg by mouth daily.     cholecalciferol (VITAMIN D3) 10 MCG (400 UNIT) TABS tablet Take 1,000 Units by mouth daily.     dorzolamide (TRUSOPT) 2 % ophthalmic solution Place 1 drop into the right eye 2 (two) times daily.     Ferrous Sulfate (IRON PO) Take 1 tablet by mouth daily.     metFORMIN (GLUCOPHAGE-XR) 500 MG 24 hr tablet Take 1 tablet (500 mg total) by mouth 2 (two) times daily with a meal. 180 tablet 3   Multiple Vitamins-Minerals (ONE-A-DAY PROACTIVE 65+) TABS Take 1 tablet by mouth daily.     vitamin E 180 MG (400 UNITS) capsule Take 400 Units by mouth daily.     XTANDI 40 MG capsule Take 160 mg by mouth at bedtime.      Allergies as of 06/10/2023   (No Known Allergies)    Family History  Problem Relation Age of Onset   Kidney failure Brother 28       has been on HD since age 100    Edema Mother        Legs   Arthritis Sister        knees   Stroke  Maternal Uncle        70s-80s   Cancer Neg Hx     Social History   Socioeconomic History   Marital status: Widowed    Spouse name: [name]   Number of children: 2   Years of education: Not on file   Highest education level: Not on file  Occupational History   Occupation: Disabled  Tobacco Use  Smoking status: Former    Current packs/day: 0.00    Average packs/day: 0.5 packs/day for 30.0 years (15.0 ttl pk-yrs)    Types: Cigarettes    Start date: 10/04/1977    Quit date: 10/05/2007    Years since quitting: 15.6   Smokeless tobacco: Never  Vaping Use   Vaping status: Never Used  Substance and Sexual Activity   Alcohol use: Not Currently    Alcohol/week: 0.0 standard drinks of alcohol    Comment: no alcohol for five years (per conversation 2020)   Drug use: No    Types: "Crack" cocaine, Marijuana    Comment: per Epic note 2009hx of crack cocaine use 12 years ago per pt, no current marijuana use per pt   Sexual activity: Yes  Other Topics Concern   Not on file  Social History Narrative   On disability - frostbite. Used to work for [employer]. Retired in 1990. Has 2 kids. 6th grade. Resides with significant other.      Current Social History 06/08/2019        Patient lives with a significant other in a/an apartment which is 1 story/stories. There are steps up to the entrance the patient uses.       Patient's method of transportation is personal car.      The highest level of education was some high school.      The patient currently disabled.      Identified important Relationships are Office manager (Fiance) and William Jones (Daughter)       Pets : None       Interests / Fun: "Going to the park and walking around. Go visit people that we know."       Current Stressors: "I don't have any"       Religious / Personal Beliefs: "Holiness"       L. Leward Quan, RN, BSN       Social Determinants of Health   Financial Resource Strain: Low Risk  (06/25/2020)    Overall Financial Resource Strain (CARDIA)    Difficulty of Paying Living Expenses: Not hard at all  Food Insecurity: No Food Insecurity (10/20/2022)   Hunger Vital Sign    Worried About Running Out of Food in the Last Year: Never true    Ran Out of Food in the Last Year: Never true  Transportation Needs: No Transportation Needs (10/20/2022)   PRAPARE - Administrator, Civil Service (Medical): No    Lack of Transportation (Non-Medical): No  Physical Activity: Sufficiently Active (06/25/2020)   Exercise Vital Sign    Days of Exercise per Week: 5 days    Minutes of Exercise per Session: 150+ min  Stress: No Stress Concern Present (10/20/2022)   [employer] of Occupational Health - Occupational Stress Questionnaire    Feeling of Stress : Not at all  Social Connections: Moderately Integrated (06/25/2020)   Social Connection and Isolation Panel [NHANES]    Frequency of Communication with Friends and Family: More than three times a week    Frequency of Social Gatherings with Friends and Family: Twice a week    Attends Religious Services: More than 4 times per year    Active Member of [employer] or Organizations: No    Attends Banker Meetings: Never    Marital Status: Living with partner  Intimate Partner Violence: Not At Risk (06/25/2020)   Humiliation, Afraid, Rape, and Kick questionnaire    Fear of Current or Ex-Partner: No  Emotionally Abused: No    Physically Abused: No    Sexually Abused: No    Review of Systems: Gen: Denies fever, sweats or chills. No weight loss.  CV: Denies chest pain, palpitations or edema. Resp: Denies cough, shortness of breath of hemoptysis.  GI: See HPI.   GU : Denies urinary burning, blood in urine, increased urinary frequency or incontinence. MS: Denies joint pain, muscles aches or weakness. Derm: Denies rash, itchiness, skin lesions or unhealing ulcers. Psych: Denies depression, anxiety, memory loss or confusion. Heme:  Denies easy bruising, bleeding. Neuro:  Denies headaches, dizziness or paresthesias. Endo:  Denies any problems with DM, thyroid or adrenal function.  Physical Exam: Vital signs in last 24 hours: Temp:  [97.7 F (36.5 C)-98.5 F (36.9 C)] 97.9 F (36.6 C) (09/07 0623) Pulse Rate:  [64-107] 67 (09/07 0623) Resp:  [13-23] 20 (09/07 0623) BP: (139-195)/(76-101) 153/86 (09/07 0623) SpO2:  [92 %-100 %] 100 % (09/07 0436) Weight:  [81.6 kg] 81.6 kg (09/06 1939)   General: Alert 72 year old male in no acute distress. Head:  Normocephalic and atraumatic. Eyes:  No scleral icterus. Conjunctiva pink. Ears:  Normal auditory acuity. Nose:  No deformity, discharge or lesions. Mouth:  Dentition intact. No ulcers or lesions.  Neck:  Supple. No lymphadenopathy or thyromegaly.  Lungs: Breath sounds clear throughout. No wheezes, rhonchi or crackles.  Heart: Regular rate and rhythm, no murmurs. Abdomen: Soft, nondistended.  Nontender.  Positive bowel sounds to all 4 quadrants.  No hepatosplenomegaly.  No bruit. Rectal: Deferred. Musculoskeletal:  Symmetrical without gross deformities.  Pulses:  Normal pulses noted. Extremities: Mild bilateral lower extremity edema. Neurologic:  Alert and  oriented x 4. No focal deficits.  Skin:  Intact without significant lesions or rashes. Psych:  Alert and cooperative. Normal mood and affect.  Intake/Output from previous day: 09/06 0701 - 09/07 0700 In: 360 [I.V.:360] Out: 1600 [Urine:1600] Intake/Output this shift: No intake/output data recorded.  Lab Results: Recent Labs    06/10/23 1238 06/10/23 1948 06/11/23 0203  WBC 10.0 8.9 7.5  HGB 5.8* 6.0* 6.7*  HCT 18.6* 19.1* 21.5*  PLT 335 341 291   BMET Recent Labs    06/10/23 1948 06/11/23 0203  NA 139 135  K 3.8 4.4  CL 99 100  CO2 25 27  GLUCOSE 223* 196*  BUN 17 12  CREATININE 0.77 0.69  CALCIUM 8.5* 7.7*   LFT Recent Labs    06/11/23 0203  PROT 5.8*  ALBUMIN 2.8*  AST 30  ALT  40  ALKPHOS 41  BILITOT 0.9   PT/INR Recent Labs    06/11/23 0203  LABPROT 13.9  INR 1.1   Hepatitis Panel No results for input(s): "HEPBSAG", "HCVAB", "HEPAIGM", "HEPBIGM" in the last 72 hours.    Studies/Results: DG Chest 2 View  Result Date: 06/10/2023 CLINICAL DATA:  Shortness of breath. EXAM: CHEST - 2 VIEW COMPARISON:  December 06, 2014 FINDINGS: The heart size and mediastinal contours are within normal limits. Very mild focal scarring and/or atelectasis is seen along the periphery of the mid right lung. There is no evidence of an acute infiltrate, pleural effusion or pneumothorax. Multiple chronic left-sided rib fractures are noted. Multilevel degenerative changes seen throughout the thoracic spine. IMPRESSION: Very mild mid right lung focal scarring and/or atelectasis. Correlation with nonemergent chest CT is recommended to exclude the presence of a small pulmonary nodule. Electronically Signed   By: Aram Candela M.D.   On: 06/10/2023 20:46    IMPRESSION/PLAN:  72 year old male with a past medical history of IDA with hematochezia secondary to small bowel, colonic and rectal AVMs/radiation proctitis who presented to the ED with symptomatic anemia and melena.  Admission hemoglobin 5.8 down from 10. Transfused 2 units of PRBCs.  Posttransfusion H&H yet collected.  No active GI bleeding/melena since arriving to the ED.  Hemodynamically stable. -Await posttransfusion H&H every 6 hours x 24 hours -Transfuse for hemoglobin less than 7 or as needed if symptomatic -NPO -EGD today benefits and risks discussed including risk with sedation, risk of bleeding, perforation and infection  -Continue PPI IV twice daily -Recommend IV iron during this hospital admission  Acute on chronic iron deficiency anemia -See plan above  History of prostate cancer status post radiation treatment with subsequent radiation proctitis  History of a hyperplastic colon polyp per colonoscopy  2015      Malachi Carl Kennedy-Smith  06/11/2023, 8:36AM

## 2023-06-11 NOTE — Op Note (Signed)
[facility] Patient Name: William Jones Procedure Date : 06/11/2023 MRN: [id number] Attending MD: William Jones. William Jones , MD, [id number] Date of Birth: 03-26-1951 CSN: [id number] Age: 72 Admit Type: Inpatient Procedure:                Upper GI endoscopy Indications:              Melena - anemia - Hgb dropped to 5s and responded                            appropriately to transfusion. EGD to further                            evaluate. Dr. Elnoria Jones is primary GI MD Providers:                William Jones. William Lank, MD, William Gallo. Allred RN,                            RN, William Hector, RN, William Jones, Technician Referring MD:              Medicines:                Monitored Anesthesia Care Complications:            No immediate complications. Estimated blood loss:                            Minimal. Estimated Blood Loss:     Estimated blood loss was minimal. Procedure:                Pre-Anesthesia Assessment:                           - Prior to the procedure, a History and Physical                            was performed, and patient medications and                            allergies were reviewed. The patient's tolerance of                            previous anesthesia was also reviewed. The risks                            and benefits of the procedure and the sedation                            options and risks were discussed with the patient.                            All questions were answered, and informed consent                            was obtained. Prior Anticoagulants: The patient has  taken no anticoagulant or antiplatelet agents. ASA                            Grade Assessment: III - A patient with severe                            systemic disease. After reviewing the risks and                            benefits, the patient was deemed in satisfactory                            condition to undergo the procedure.                            After obtaining informed consent, the endoscope was                            passed under direct vision. Throughout the                            procedure, the patient's blood pressure, pulse, and                            oxygen saturations were monitored continuously. The                            [id number]-[id number] ([id number] Olympus endoscope was introduced                            through the mouth, and advanced to the second part                            of duodenum. The upper GI endoscopy was                            accomplished without difficulty. The patient                            tolerated the procedure well. Scope In: Scope Out: Findings:      Esophagogastric landmarks were identified: the Z-line was found at 38       cm, the gastroesophageal junction was found at 38 cm and the upper       extent of the gastric folds was found at 39 cm from the incisors.      A 1 cm hiatal hernia was present.      The exam of the esophagus was otherwise normal.      One non-bleeding cratered gastric ulcer with a clean ulcer base (Forrest       Class III) and heaped up borders was found in the prepyloric region of       the stomach. The lesion was 4 mm in largest dimension. Biopsies were       taken from the periphery with a cold forceps for histology.      The exam  of the stomach was otherwise normal.      Biopsies were taken with a cold forceps for Helicobacter pylori testing.      The examined duodenum was normal. Impression:               - Esophagogastric landmarks identified.                           - 1 cm hiatal hernia.                           - Normal esophagus otherwise.                           - Non-bleeding gastric ulcer with a clean ulcer                            base (Forrest Class III). Biopsied.                           - Normal stomach otherwise. Biopsies taken to rule                            out H pylori.                           - Normal  examined duodenum.                           Gastric ulcer is the likely cause for his symptoms                            / anemia. Recommendation:           - Return patient to hospital ward for ongoing care.                           - Okay to start full liquid diet now.                           - Continue present medications.                           - Continue protonix 40mg  twice daily                           - Await pathology results.                           - Would monitor in the hospital today given level                            of anemia and transfusion requirement. If no                            further bleeding, Hgb stable tomorrow, can likely  be discharged tomorrow on oral protonix 40mg  BID.                            He will warrant outpatient followup with repeat EGD                            in a few months to confirm mucosal healing, can                            coordinate with Dr. Elnoria Jones primary GI MD.                           - Will reassess him tomorrow AM, call in the                            interim with questions. Procedure Code(s):        --- Professional ---                           [phone], Esophagogastroduodenoscopy, flexible,                            transoral; with biopsy, single or multiple Diagnosis Code(s):        --- Professional ---                           K44.9, Diaphragmatic hernia without obstruction or                            gangrene                           K25.9, Gastric ulcer, unspecified as acute or                            chronic, without hemorrhage or perforation                           K92.1, Melena (includes Hematochezia) CPT copyright 2022 American Medical Association. All rights reserved. The codes documented in this report are preliminary and upon coder review may  be revised to meet current compliance requirements. William Spare P. Donnald Tabar, MD 06/11/2023 12:51:59 PM This report has been  signed electronically. Number of Addenda: 0

## 2023-06-11 NOTE — ED Notes (Signed)
Endo states she will be on her way to transport pt for scope procedure.

## 2023-06-11 NOTE — Progress Notes (Addendum)
Subjective:  William Jones is a 72 year old male with a past medical history of metastatic prostate cancer to the bone s/p radiation and on antiandrogenic therapy c/b prior history of radiation proctitis, angiodysplasia of colon with hemorrhage, duodenitis, IDA, HTN, T2DM, who presents to the emergency department with fatigue, black stool for five days, and hemoglobin of 5.8.     Objective:  Vital signs in last 24 hours: Vitals:   06/11/23 1251 06/11/23 1300 06/11/23 1315 06/11/23 1330  BP:  (!) 137/57 139/75 (!) 147/67  Pulse: (!) 58 (!) 55 60 61  Resp: 16 14 16 17   Temp:  (!) 97.2 F (36.2 C)  (!) 97.2 F (36.2 C)  TempSrc:      SpO2: 100% 100% 100% 99%  Weight:      Height:       Physical Exam Constitutional:      Appearance: He is well-developed.  Cardiovascular:     Rate and Rhythm: Normal rate and regular rhythm.     Heart sounds: No murmur heard.    No friction rub. No gallop.  Pulmonary:     Effort: Pulmonary effort is normal.     Breath sounds: Normal breath sounds. No rhonchi or rales.  Abdominal:     General: Bowel sounds are normal.     Palpations: Abdomen is soft. There is no hepatomegaly or splenomegaly.     Tenderness: There is no abdominal tenderness.  Musculoskeletal:     Right lower leg: No edema.  Skin:    Capillary Refill: Capillary refill takes less than 2 seconds.  Neurological:     General: No focal deficit present.     Mental Status: He is alert.    Labs CMP unremarkable Hemoglobin 5.8->6.0->6.7->8.2->pending repeat 1300 MCV 90 Ferritin 19, iron 33, TIBC 367, saturation 9% B12+folate: normal   Assessment/Plan:  Principal Problem:   Symptomatic anemia Active Problems:   Iron deficiency anemia   Melena   Essential hypertension   GI bleeding   Acute gastric ulcer with hemorrhage  William Jones is a 72 year old male with a past medical history of metastatic prostate cancer to the bone s/p radiation and on  antiandrogenic therapy c/b prior history of radiation proctitis, angiodysplasia of colon with hemorrhage, duodenitis, IDA, HTN, T2DM who presented with fatigue and melena and admitted for symptomatic anemia on hospital day 1   PUD  Symptomatic anemia History of Iron Deficiency Anemia  Chronic normocytic anemia Symptomatic normocytic anemia and 5 day history of melena and fatigue. Symptoms of acid reflux with meals and with laying down and no prior GERD treatment. PUD diagnosed by EGD and bleeding stopped with PPI therapy. Plan: -Continue 40mg  PPI BID for 1 month -Thin liquid diet -Repeat CBC tomorrow morning -start iron sucrate 500 mg IV, give another 500 tomorrow morning   Radiation Proctitis Duodenitis  History of Angiodysplasia of colon with hemorrhage Patient has a history of radiation proctitis due to radiation treatment of prostate cancer. In previous sigmoidoscopes and colonoscopies, he was found to have radiation proctitis that was negative for recurrence in 2022. Found to have larger internal hemorrhoids as well during a sigmoidoscopy in 2019. He denies BRBPR, lower GI bleed is unlikely causing the symptomatic anemia.  Type 2 Diabetes Mellitus  Patient is NPO at this time. Last A1c Jan 6.7 on Metformin therapy 500 mg BID. -Hold home metformin -CBG check in the morning  -Consider starting SSI if persistently hyperglycemic when he is no longer  NPO   Hx Prostate Cancer s/p radiation Metastasis to bone On nightly androgen receptor inhibitor, Xtandi ( 4 pills daily).  He is also managed with monthly infusions of Denusumab and monitoring per Urology. Plan: -restarted Xtandi (pt brought from home) with permission from on call urology, Dr. Cathren Harsh   Hypertension  He is on a home regimen of amlodipine, restarted in hospital   Abnormal finding on CXR Admission CXR with very mild mid right lung focal scarring and/or atelectasis. Plan:  -Outpatient Correlation with nonemergent  chest CT is recommended to exclude the presence of a small pulmonary nodule  Prior to Admission Living Arrangement: home Anticipated Discharge Location: home Barriers to Discharge: observation for bleeding Dispo: Anticipated discharge in approximately 1 day.   Meryl Dare, MD 06/11/2023, 1:54 PM Pager: [phone] After 5pm on weekdays and 1pm on weekends: On Call pager [phone]

## 2023-06-12 ENCOUNTER — Other Ambulatory Visit: Payer: Self-pay | Admitting: Student

## 2023-06-12 DIAGNOSIS — D649 Anemia, unspecified: Secondary | ICD-10-CM | POA: Diagnosis not present

## 2023-06-12 DIAGNOSIS — K921 Melena: Secondary | ICD-10-CM

## 2023-06-12 LAB — TYPE AND SCREEN
ABO/RH(D): A POS
Antibody Screen: NEGATIVE
Unit division: 0
Unit division: 0

## 2023-06-12 LAB — CBC WITH DIFFERENTIAL/PLATELET
Abs Immature Granulocytes: 0.09 10*3/uL — ABNORMAL HIGH (ref 0.00–0.07)
Basophils Absolute: 0 10*3/uL (ref 0.0–0.1)
Basophils Relative: 0 %
Eosinophils Absolute: 0 10*3/uL (ref 0.0–0.5)
Eosinophils Relative: 0 %
HCT: 26.6 % — ABNORMAL LOW (ref 39.0–52.0)
Hemoglobin: 8.8 g/dL — ABNORMAL LOW (ref 13.0–17.0)
Immature Granulocytes: 1 %
Lymphocytes Relative: 5 %
Lymphs Abs: 0.4 10*3/uL — ABNORMAL LOW (ref 0.7–4.0)
MCH: 30.3 pg (ref 26.0–34.0)
MCHC: 33.1 g/dL (ref 30.0–36.0)
MCV: 91.7 fL (ref 80.0–100.0)
Monocytes Absolute: 0.9 10*3/uL (ref 0.1–1.0)
Monocytes Relative: 12 %
Neutro Abs: 6.2 10*3/uL (ref 1.7–7.7)
Neutrophils Relative %: 82 %
Platelets: 314 10*3/uL (ref 150–400)
RBC: 2.9 MIL/uL — ABNORMAL LOW (ref 4.22–5.81)
RDW: 16.8 % — ABNORMAL HIGH (ref 11.5–15.5)
WBC: 7.6 10*3/uL (ref 4.0–10.5)
nRBC: 5.4 % — ABNORMAL HIGH (ref 0.0–0.2)

## 2023-06-12 LAB — BASIC METABOLIC PANEL
Anion gap: 9 (ref 5–15)
BUN: 11 mg/dL (ref 8–23)
CO2: 26 mmol/L (ref 22–32)
Calcium: 7.5 mg/dL — ABNORMAL LOW (ref 8.9–10.3)
Chloride: 101 mmol/L (ref 98–111)
Creatinine, Ser: 0.69 mg/dL (ref 0.61–1.24)
GFR, Estimated: >60 mL/min (ref >60)
Glucose, Bld: 182 mg/dL — ABNORMAL HIGH (ref 70–99)
Potassium: 3.8 mmol/L (ref 3.5–5.1)
Sodium: 136 mmol/L (ref 135–145)

## 2023-06-12 LAB — BPAM RBC
Blood Product Expiration Date: 202409162359
Blood Product Expiration Date: 202409172359
ISSUE DATE / TIME: 202409062138
ISSUE DATE / TIME: 202409070414
Unit Type and Rh: 6200
Unit Type and Rh: 6200

## 2023-06-12 LAB — GLUCOSE, CAPILLARY: Glucose-Capillary: 169 mg/dL — ABNORMAL HIGH (ref 70–99)

## 2023-06-12 MED ORDER — PANTOPRAZOLE SODIUM 40 MG PO TBEC: DELAYED_RELEASE_TABLET | ORAL | 0 refills | Status: DC

## 2023-06-12 NOTE — Progress Notes (Signed)
Progress Note   Subjective  Patient feeling well this morning.  He had a bowel movement and passed green/brown stool, no further melena.  Hemoglobin stable posttransfusion.  Tolerating liquid diet.   Objective   Vital signs in last 24 hours: Temp:  [97.2 F (36.2 C)-98.1 F (36.7 C)] 97.8 F (36.6 C) (09/08 0744) Pulse Rate:  [55-73] 70 (09/08 0322) Resp:  [14-20] 19 (09/08 0322) BP: (106-179)/(55-98) 155/82 (09/08 0744) SpO2:  [97 %-100 %] 97 % (09/08 0322) Weight:  [81.6 kg] 81.6 kg (09/07 1209) Last BM Date : 06/11/23 General:    AA male in NAD Neurologic:  Alert and oriented,  grossly normal neurologically. Psych:  Cooperative. Normal mood and affect.  Intake/Output from previous day: 09/07 0701 - 09/08 0700 In: 200 [I.V.:200] Out: -  Intake/Output this shift: No intake/output data recorded.  Lab Results: Recent Labs    06/10/23 1948 06/11/23 0203 06/11/23 0827 06/12/23 0153  WBC 8.9 7.5  --  7.6  HGB 6.0* 6.7* 8.2* 8.8*  HCT 19.1* 21.5* 25.1* 26.6*  PLT 341 291  --  314   BMET Recent Labs    06/10/23 1948 06/11/23 0203 06/12/23 0153  NA 139 135 136  K 3.8 4.4 3.8  CL 99 100 101  CO2 25 27 26   GLUCOSE 223* 196* 182*  BUN 17 12 11   CREATININE 0.77 0.69 0.69  CALCIUM 8.5* 7.7* 7.5*   LFT Recent Labs    06/11/23 0203  PROT 5.8*  ALBUMIN 2.8*  AST 30  ALT 40  ALKPHOS 41  BILITOT 0.9   PT/INR Recent Labs    06/11/23 0203  LABPROT 13.9  INR 1.1    Studies/Results: DG Chest 2 View  Result Date: 06/10/2023 CLINICAL DATA:  Shortness of breath. EXAM: CHEST - 2 VIEW COMPARISON:  December 06, 2014 FINDINGS: The heart size and mediastinal contours are within normal limits. Very mild focal scarring and/or atelectasis is seen along the periphery of the mid right lung. There is no evidence of an acute infiltrate, pleural effusion or pneumothorax. Multiple chronic left-sided rib fractures are noted. Multilevel degenerative changes seen throughout  the thoracic spine. IMPRESSION: Very mild mid right lung focal scarring and/or atelectasis. Correlation with nonemergent chest CT is recommended to exclude the presence of a small pulmonary nodule. Electronically Signed   By: Aram Candela M.D.   On: 06/10/2023 20:46       Assessment / Plan:    72 y/o male here for the following:  Upper GI bleed secondary to gastric ulcer Posthemorrhagic anemia  EGD yesterday showed a clean-based gastric ulcer, already stopped bleeding with PPI alone, no endoscopic therapy required.  Biopsies taken from the periphery of this to rule out any precancerous change but it appears benign.  Biopsies also taken to rule out H. pylori.  This pathology should be back sometime next week.  Since the procedure he has done well.  Tolerating liquid diet, his hemoglobin is stable posttransfusion.  He had a nonbloody bowel movement this morning.  I think we can advance his diet and discharge later today.  He should be discharged on Protonix 40 mg twice daily for 6 weeks, then once daily thereafter.  He should have a CBC checked sometime later next week to make sure stable.  I will follow-up pathology results with him sometime next week.  He will need a repeat EGD in a few months to confirm mucosal healing of the ulcer.  I can  let Dr. Elnoria Howard know about this, his primary GI, to help coordinate repeat EGD.  PLAN: - advance diet - okay to discharge today - continue protonix 40mg  BID for 6 weeks then once daily thereafter - I will relay pathology results to him next week - repeat CBC with his primary care sometime next week - will need a repeat EGD in a few months to confirm mucosal healing, I will discuss with Dr. Elnoria Howard his primary GI MD - avoid all NSAIDS  Please call with any questions, we will otherwise sign off for now.  Harlin Rain, MD [facility]

## 2023-06-12 NOTE — Plan of Care (Signed)
  Problem: Education: Goal: Knowledge of General Education information will improve Description: Including pain rating scale, medication(s)/side effects and non-pharmacologic comfort measures Outcome: Progressing   Problem: Clinical Measurements: Goal: Ability to maintain clinical measurements within normal limits will improve Outcome: Progressing Goal: Will remain free from infection Outcome: Progressing Goal: Respiratory complications will improve Outcome: Progressing Goal: Cardiovascular complication will be avoided Outcome: Progressing   Problem: Activity: Goal: Risk for activity intolerance will decrease Outcome: Progressing   Problem: Nutrition: Goal: Adequate nutrition will be maintained Outcome: Progressing   Problem: Coping: Goal: Level of anxiety will decrease Outcome: Progressing   Problem: Elimination: Goal: Will not experience complications related to bowel motility Outcome: Progressing   Problem: Pain Managment: Goal: General experience of comfort will improve Outcome: Progressing   Problem: Safety: Goal: Ability to remain free from injury will improve Outcome: Progressing   Problem: Skin Integrity: Goal: Risk for impaired skin integrity will decrease Outcome: Progressing   

## 2023-06-12 NOTE — Progress Notes (Signed)
   Subjective:  Feeling well, no melena since admission.  Tolerating liquid diet well.  Eager to leave the hospital.    Objective:  Vital signs in last 24 hours: Vitals:   06/12/23 0744 06/12/23 1104 06/12/23 1607 06/12/23 1616  BP: (!) 155/82 (!) 152/90 (!) 175/91 (!) 169/89  Pulse:      Resp:  17 18   Temp: 97.8 F (36.6 C) 98 F (36.7 C) 97.7 F (36.5 C)   TempSrc: Oral Oral Oral   SpO2:   100%   Weight:      Height:       No distress Heart rate is normal, rhythm is regular, radial pulses are strong Breathing is regular and unlabored on room air Abdomen is nontender Skin is warm and dry Alert and oriented Pleasant, congruent affect  Labs Stable hemoglobin   Assessment/Plan:  Principal Problem:   Acute gastric ulcer with hemorrhage Active Problems:   Iron deficiency anemia   Essential hypertension  JESSES FICCA is a 72 year old male with a past medical history of metastatic prostate cancer to the bone s/p radiation and on antiandrogenic therapy c/b prior history of radiation proctitis, angiodysplasia of colon with hemorrhage, duodenitis, IDA, HTN, T2DM who presented with fatigue and melena and admitted for symptomatic anemia, stable for discharge today.   PUD  Symptomatic anemia History of Iron Deficiency Anemia  Chronic normocytic anemia Status post EGD with 1 nonbleeding gastric ulcer.  No more bleeding episodes.  Continue twice daily PPI for 6 weeks, then once daily thereafter. Plan: -Continue 40mg  PPI BID for next weeks -Advance diet -CBC in a week or 2   Hx Prostate Cancer s/p radiation Metastasis to bone On nightly androgen receptor inhibitor, Xtandi ( 4 pills daily).  He is also managed with monthly infusions of Denusumab and monitoring per Urology. Plan: -restarted Xtandi (pt brought from home) with permission from on call urology, Dr. Cathren Harsh -Needs to avoid all NSAIDs and Goody/BC powder   Hypertension  He is on a home regimen of  amlodipine, restarted in hospital   Abnormal finding on CXR Admission CXR with very mild mid right lung focal scarring and/or atelectasis. Plan:  -Outpatient Correlation with nonemergent chest CT is recommended to exclude the presence of a small pulmonary nodule  Stable for discharge.  Discharge summary to follow.  Marrianne Mood, MD 06/12/2023, 4:37 PM Pager: [phone] After 5pm on weekdays and 1pm on weekends: On Call pager [phone]

## 2023-06-13 ENCOUNTER — Telehealth: Payer: Self-pay

## 2023-06-13 ENCOUNTER — Encounter (HOSPITAL_COMMUNITY): Payer: Self-pay | Admitting: Gastroenterology

## 2023-06-13 ENCOUNTER — Telehealth: Payer: Self-pay | Admitting: *Deleted

## 2023-06-13 ENCOUNTER — Other Ambulatory Visit: Payer: Self-pay | Admitting: Internal Medicine

## 2023-06-13 NOTE — Telephone Encounter (Signed)
Patient walked in requesting note to Return to work on next Monday.  Recent discharge from the hospital.  Message set to the Debe Coder to work on for patient./

## 2023-06-13 NOTE — Transitions of Care (Post Inpatient/ED Visit) (Signed)
06/13/2023  Name: William Jones MRN: [id number] DOB: Jul 02, 1951  Today's TOC FU Call Status: Today's TOC FU Call Status:: Successful TOC FU Call Completed TOC FU Call Complete Date: 06/13/23 Patient's Name and Date of Birth confirmed.  Transition Care Management Follow-up Telephone Call Date of Discharge: 06/12/23 Discharge Facility: [facility]) Type of Discharge: Inpatient Admission Primary Inpatient Discharge Diagnosis:: anemia How have you been since you were released from the hospital?: Better Any questions or concerns?: No  Items Reviewed: Did you receive and understand the discharge instructions provided?: Yes Medications obtained,verified, and reconciled?: Yes (Medications Reviewed) Any new allergies since your discharge?: No Dietary orders reviewed?: Yes Do you have support at home?: Yes People in Home: significant other  Medications Reviewed Today: Medications Reviewed Today     Reviewed by Karena Addison, LPN (Licensed Practical Nurse) on 06/13/23 at 1218  Med List Status: <None>   Medication Order Taking? Sig Documenting Provider Last Dose Status Informant  acetaminophen (TYLENOL) 500 MG tablet [id number] No Take 2 tablets (1,000 mg total) by mouth every 6 (six) hours as needed for mild pain or moderate pain. Fayrene Helper, PA-C unk Active Self, Pharmacy Records  amLODipine (NORVASC) 5 MG tablet [id number] No Take 1 tablet (5 mg total) by mouth daily. Bing Neighbors, NP 06/09/2023 Active Self, Pharmacy Records  CALCIUM CITRATE PO [id number] No Take 600 mg by mouth daily. [provider] 06/09/2023 Active Self, Pharmacy Records  cholecalciferol (VITAMIN D3) 10 MCG (400 UNIT) TABS tablet [id number] No Take 1,000 Units by mouth daily. [provider] 06/09/2023 Active Self, Pharmacy Records  dorzolamide (TRUSOPT) 2 % ophthalmic solution [id number] No Place 1 drop into the right eye 2 (two) times daily. [provider] 06/09/2023 Active Self,  Pharmacy Records  Ferrous Sulfate (IRON PO) [id number] No Take 1 tablet by mouth daily. [provider] 06/09/2023 Active Self, Pharmacy Records  metFORMIN (GLUCOPHAGE-XR) 500 MG 24 hr tablet [id number] No Take 1 tablet (500 mg total) by mouth 2 (two) times daily with a meal. William Crocker, MD 06/09/2023 Active Self, Pharmacy Records  Multiple Vitamins-Minerals (ONE-A-DAY PROACTIVE 65+) TABS [id number] No Take 1 tablet by mouth daily. [provider] 06/09/2023 Active Self, Pharmacy Records  pantoprazole (PROTONIX) 40 MG tablet [id number]  Take 1 tablet (40 mg total) by mouth 2 (two) times daily for 42 days, THEN 1 tablet (40 mg total) daily. Marrianne Mood, MD  Active   vitamin E 180 MG (400 UNITS) capsule [id number] No Take 400 Units by mouth daily. [provider] 06/09/2023 Active Self, Pharmacy Records  XTANDI 40 MG capsule [id number] No Take 160 mg by mouth at bedtime. [provider] 06/09/2023 Active Self, Pharmacy Records           Med Note William Jones   Sat Jun 11, 2023 12:17 AM) Pt states he does have this medication with him if needed 9/7            Home Care and Equipment/Supplies: Were Home Health Services Ordered?: NA Any new equipment or medical supplies ordered?: NA  Functional Questionnaire: Do you need assistance with bathing/showering or dressing?: No Do you need assistance with meal preparation?: No Do you need assistance with eating?: No Do you have difficulty maintaining continence: No Do you need assistance with getting out of bed/getting out of a chair/moving?: No Do you have difficulty managing or taking your medications?: No  Follow up appointments reviewed: PCP Follow-up appointment confirmed?: No (no avail appts, sent message to  staff to schedule) MD Provider Line Number:[phone] Given: No Specialist Hospital Follow-up appointment confirmed?: NA Do you need transportation to your follow-up appointment?: No Do  you understand care options if your condition(s) worsen?: Yes-patient verbalized understanding    SIGNATURE Karena Addison, LPN [facility] Nurse Health Advisor Direct Dial [phone]

## 2023-06-13 NOTE — Discharge Summary (Cosign Needed)
Name: William Jones MRN: [id number] DOB: 1951-02-20 72 y.o. PCP: Morene Crocker, MD  Date of Admission: 06/10/2023  7:30 PM Date of Discharge: 06/13/2023 5:22 PM Attending Physician: No att. providers found  Discharge diagnosis: Principal Problem:   Acute gastric ulcer with hemorrhage Active Problems:   Iron deficiency anemia   Essential hypertension  Resolved Problems:   Melena   Symptomatic anemia   GI bleeding   Discharge medications: Allergies as of 06/12/2023   No Known Allergies      Medication List     TAKE these medications    acetaminophen 500 MG tablet Commonly known as: TYLENOL Take 2 tablets (1,000 mg total) by mouth every 6 (six) hours as needed for mild pain or moderate pain.   amLODipine 5 MG tablet Commonly known as: NORVASC Take 1 tablet (5 mg total) by mouth daily.   CALCIUM CITRATE PO Take 600 mg by mouth daily.   cholecalciferol 10 MCG (400 UNIT) Tabs tablet Commonly known as: VITAMIN D3 Take 1,000 Units by mouth daily.   dorzolamide 2 % ophthalmic solution Commonly known as: TRUSOPT Place 1 drop into the right eye 2 (two) times daily.   IRON PO Take 1 tablet by mouth daily.   metFORMIN 500 MG 24 hr tablet Commonly known as: GLUCOPHAGE-XR Take 1 tablet (500 mg total) by mouth 2 (two) times daily with a meal.   One-A-Day Proactive 65+ Tabs Take 1 tablet by mouth daily.   pantoprazole 40 MG tablet Commonly known as: PROTONIX Take 1 tablet (40 mg total) by mouth 2 (two) times daily for 42 days, THEN 1 tablet (40 mg total) daily. Start taking on: June 12, 2023   vitamin E 180 MG (400 UNITS) capsule Take 400 Units by mouth daily.   Xtandi 40 MG capsule Generic drug: enzalutamide Take 160 mg by mouth at bedtime.        Follow-up appointments:  Follow-up Information     Jeani Hawking, MD Follow up.   Specialty: Gastroenterology Contact information: [address] [place]  [address] [phone]         Morene Crocker, MD Follow up.   Specialty: Internal Medicine Contact information: [address] [place] [address] [phone]                 Disposition and recommendations: William Jones is a 72 y.o. year old who was admitted for acute blood loss anemia due to peptic ulcer disease.  Discharged home in good condition.  PUD - Continue pantoprazole 40 mg twice daily x 6 weeks, then once daily thereafter - Follow-up with GI in a few months for repeat EGD - Reinforce avoidance of NSAIDs - Follow-up pathology from EGD  Anemia - Repeat CBC  Prostate cancer with metastases to bone - Follows with urology for androgen receptor inhibitors and denosumab  Abnormal chest x-ray - Follow-up CT for mild right lung abnormality to exclude pulmonary nodule   Hospital course: Acute gastric ulcer with hemorrhage Iron deficiency anemia Melena Presented with malaise and melena.  Found to have symptomatic anemia due to a bleeding gastric ulcer.  No intervention required on EGD.  Responded well to twice daily Protonix.  No more melena during admission.  CBC stabilized.  He was also treated with IV iron infusions.  He uses BC/Goody powders frequently for pain due to his metastatic prostate cancer.  He was advised to avoid all NSAIDs at time of discharge.  He will continue twice daily  pantoprazole for 6 weeks at which point he will transition to once daily pantoprazole.  He will follow-up in the internal medicine center for monitoring and repeat CBC.  He will follow-up with GI for repeat EGD in a few months.  Pathology from EGD pending at time of discharge.  Abnormal chest x-ray Findings queried atelectasis in right lung, but could not exclude pulmonary nodule.  Recommended to follow-up with nonemergent CT in outpatient setting.  Discharge exam: Feeling well, no melena since admission.  Tolerating liquid diet well.  Eager to leave the  hospital.   Blood pressure (!) 169/89, pulse 70, temperature 97.7 F (36.5 C), temperature source Oral, resp. rate 18, height 5\' 10"  (1.778 m), weight 81.6 kg, SpO2 100%.  No distress Heart rate is normal, rhythm is regular, radial pulses are strong Breathing is regular and unlabored on room air Abdomen is nontender Skin is warm and dry Alert and oriented Pleasant, congruent affect  Pertinent studies and procedures: No results found for this or any previous visit (from the past 16109 hour(s)).  Imaging Orders         DG Chest 2 View     Lab Orders         CBC         Basic metabolic panel         HIV Antibody (routine testing w rflx)         Comprehensive metabolic panel         CBC         Protime-INR         APTT         Iron and TIBC         Ferritin         Hemoglobin and hematocrit, blood         Vitamin B12         Folate         Glucose, capillary         CBC with Differential/Platelet         Basic metabolic panel         Glucose, capillary         Glucose, capillary         POC occult blood, ED     Discharge Instructions:   Discharge Instructions      Hi William Jones It was a pleasure taking care of you at [facility]. You were admitted for symptomatic anemia and treated for peptic ulcer disease. We are discharging you home now that you are doing better. Please follow the instructions below.  1) Take Pantoprazole two times a day for 1 month. Once in morning and once at night.  2) Avoid using NSAID (e.g. ibuprofen, naproxen, mobix, etc). Avoid using Goody and BC powder. 2) Okay to start eating again once leave hospital.  3) Reach out to primary care doctor if feeling fatigued again or having return of black stools.   Take care,  Dr. Maylon Peppers MD 06/13/2023, 5:22 PM

## 2023-06-14 LAB — SURGICAL PATHOLOGY

## 2023-06-20 ENCOUNTER — Encounter (HOSPITAL_COMMUNITY): Payer: Self-pay

## 2023-06-20 ENCOUNTER — Ambulatory Visit (HOSPITAL_COMMUNITY)
Admission: EM | Admit: 2023-06-20 | Discharge: 2023-06-20 | Disposition: A | Discharge status: Home / Self Care | Payer: 59 | Attending: Family Medicine | Admitting: Family Medicine

## 2023-06-20 DIAGNOSIS — D500 Iron deficiency anemia secondary to blood loss (chronic): Secondary | ICD-10-CM | POA: Insufficient documentation

## 2023-06-20 DIAGNOSIS — R600 Localized edema: Secondary | ICD-10-CM | POA: Insufficient documentation

## 2023-06-20 LAB — BASIC METABOLIC PANEL
Anion gap: 11 (ref 5–15)
BUN: 12 mg/dL (ref 8–23)
CO2: 28 mmol/L (ref 22–32)
Calcium: 7.6 mg/dL — ABNORMAL LOW (ref 8.9–10.3)
Chloride: 97 mmol/L — ABNORMAL LOW (ref 98–111)
Creatinine, Ser: 0.67 mg/dL (ref 0.61–1.24)
GFR, Estimated: >60 mL/min (ref >60)
Glucose, Bld: 195 mg/dL — ABNORMAL HIGH (ref 70–99)
Potassium: 3.2 mmol/L — ABNORMAL LOW (ref 3.5–5.1)
Sodium: 136 mmol/L (ref 135–145)

## 2023-06-20 LAB — CBC
HCT: 31 % — ABNORMAL LOW (ref 39.0–52.0)
Hemoglobin: 10.2 g/dL — ABNORMAL LOW (ref 13.0–17.0)
MCH: 29.7 pg (ref 26.0–34.0)
MCHC: 32.9 g/dL (ref 30.0–36.0)
MCV: 90.1 fL (ref 80.0–100.0)
Platelets: 349 10*3/uL (ref 150–400)
RBC: 3.44 MIL/uL — ABNORMAL LOW (ref 4.22–5.81)
RDW: 16.7 % — ABNORMAL HIGH (ref 11.5–15.5)
WBC: 5.2 10*3/uL (ref 4.0–10.5)
nRBC: 0 % (ref 0.0–0.2)

## 2023-06-20 MED ORDER — FUROSEMIDE 20 MG PO TABS: 20.0000 mg | ORAL_TABLET | Freq: Every day | ORAL | 0 refills | Status: DC | PRN

## 2023-06-20 MED ORDER — POTASSIUM CHLORIDE ER 10 MEQ PO TBCR: 20.0000 meq | EXTENDED_RELEASE_TABLET | Freq: Every day | ORAL | 0 refills | Status: DC

## 2023-06-20 NOTE — ED Provider Notes (Addendum)
[facility]    CSN: [id number] Arrival date & time: 06/20/23  0802      History   Chief Complaint Chief Complaint  Patient presents with   Foot Pain    HPI William Jones is a 72 y.o. male.    Foot Pain  Here for bilateral edema that is been bothering him since September 12.  Is causing some tenderness of his feet.  No fever or cough or congestion.  No shortness of breath now and energy is much better.  On September 6 he was seen here.  When his hemoglobin was found to be 5.8, he was sent to the emergency room and admitted for a gastric ulcer with hemorrhage.  He is now on pantoprazole.  He had been taking a lot of Goody powders and NSAIDs for pain due to his prostate cancer before the gastric ulcer was discovered.  He is on amlodipine for his hypertension and he will be seeing his primary care at this week on September 19.  He does note that Thursday before the swelling was noted he had eaten at a cafeteria and he thinks there was a good bit of salt in the foods he ate, though they were all vegetables.  Also yesterday he ate at [employer] and he feels the fish was salty that he ate there.  Past Medical History:  Diagnosis Date   Angiodysplasia of colon with hemorrhage 10/14/2020   Duodenitis    Essential hypertension 07/16/2015   Fatigue 11/06/2020   Glaucoma    H/O: substance abuse (HCC)    Hemorrhage of rectum and anus 10/14/2020   History of frostbite    History of prostate cancer 03/20/2014   Lost to f/u with [facility] in the past 2011 with elevated PSA >30 at that time.  Saw [facility] (Dr. Isabel Caprice) on 03/13/14. Cancer Noted 03/13/14 office visit. Gleason score 7. Plan CT Ab/pelvis with contrast, bone scan    History of radiation therapy nov 2015 to feb 2016   Hyperlipidemia 11/20/2019   Iron deficiency anemia    Malignant tumor of prostate (HCC) 10/14/2020   Peripheral neuropathy    Prostate cancer (HCC) 03/05/14   Gleason 7, volume 45 gm    Rash and nonspecific skin eruption 08/04/2017   Rectal mass 09/15/2016   S/P radiation therapy 09/18/14-11/15/14   prostate/ 7800Gy/40sessions   Thrombocytopenia (HCC)     Patient Active Problem List   Diagnosis Date Noted   Acute gastric ulcer with hemorrhage 06/11/2023   Rash 07/07/2021   First degree hemorrhoids 10/14/2020   Thrombosed hemorrhoids 10/14/2020   Type 2 diabetes mellitus (HCC) 12/18/2019   Hyperlipidemia 11/20/2019   Prostate cancer metastatic to bone (HCC) 12/29/2018   Glaucoma 08/21/2018   Leukopenia 04/20/2017   Healthcare maintenance 06/23/2016   Essential hypertension 07/16/2015   Erectile dysfunction 01/23/2014   Iron deficiency anemia 01/08/2014    Past Surgical History:  Procedure Laterality Date   BIOPSY  06/11/2023   Procedure: BIOPSY;  Surgeon: Benancio Deeds, MD;  Location: [facility] ENDOSCOPY;  Service: Gastroenterology;;   COLONOSCOPY N/A 01/10/2014   Procedure: COLONOSCOPY;  Surgeon: Theda Belfast, MD;  Location: [facility] ENDOSCOPY;  Service: Endoscopy;  Laterality: N/A;   ESOPHAGOGASTRODUODENOSCOPY N/A 01/10/2014   Procedure: ESOPHAGOGASTRODUODENOSCOPY (EGD);  Surgeon: Theda Belfast, MD;  Location: [facility] ENDOSCOPY;  Service: Endoscopy;  Laterality: N/A;   ESOPHAGOGASTRODUODENOSCOPY (EGD) WITH PROPOFOL N/A 06/11/2023   Procedure: ESOPHAGOGASTRODUODENOSCOPY (EGD) WITH PROPOFOL;  Surgeon: Benancio Deeds, MD;  Location: [facility] ENDOSCOPY;  Service: Gastroenterology;  Laterality: N/A;   FLEXIBLE SIGMOIDOSCOPY N/A 09/19/2015   Procedure: FLEXIBLE SIGMOIDOSCOPY;  Surgeon: Jeani Hawking, MD;  Location: [facility] ENDOSCOPY;  Service: Endoscopy;  Laterality: N/A;   FLEXIBLE SIGMOIDOSCOPY N/A 06/03/2017   Procedure: FLEXIBLE SIGMOIDOSCOPY;  Surgeon: Jeani Hawking, MD;  Location: [facility] ENDOSCOPY;  Service: Endoscopy;  Laterality: N/A;   FLEXIBLE SIGMOIDOSCOPY N/A 01/06/2018   Procedure: FLEXIBLE SIGMOIDOSCOPY;  Surgeon: Jeani Hawking, MD;  Location: [facility] ENDOSCOPY;  Service: Endoscopy;   Laterality: N/A;   GIVENS CAPSULE STUDY N/A 01/10/2014   Procedure: GIVENS CAPSULE STUDY;  Surgeon: Theda Belfast, MD;  Location: [facility] ENDOSCOPY;  Service: Endoscopy;  Laterality: N/A;   HOT HEMOSTASIS N/A 09/19/2015   Procedure: HOT HEMOSTASIS (ARGON PLASMA COAGULATION/BICAP);  Surgeon: Jeani Hawking, MD;  Location: [facility] ENDOSCOPY;  Service: Endoscopy;  Laterality: N/A;   HOT HEMOSTASIS N/A 06/03/2017   Procedure: HOT HEMOSTASIS (ARGON PLASMA COAGULATION/BICAP);  Surgeon: Jeani Hawking, MD;  Location: [facility] ENDOSCOPY;  Service: Endoscopy;  Laterality: N/A;   PROSTATE BIOPSY  03/05/14   Gleason 7, vol 45 gm       Home Medications    Prior to Admission medications   Medication Sig Start Date End Date Taking? Authorizing Provider  acetaminophen (TYLENOL) 500 MG tablet Take 2 tablets (1,000 mg total) by mouth every 6 (six) hours as needed for mild pain or moderate pain. 05/01/21  Yes Fayrene Helper, PA-C  amLODipine (NORVASC) 5 MG tablet Take 1 tablet (5 mg total) by mouth daily. 06/10/23 06/09/24 Yes Bing Neighbors, NP  CALCIUM CITRATE PO Take 600 mg by mouth daily.   Yes [provider]  cholecalciferol (VITAMIN D3) 10 MCG (400 UNIT) TABS tablet Take 1,000 Units by mouth daily.   Yes [provider]  Ferrous Sulfate (IRON PO) Take 1 tablet by mouth daily.   Yes [provider]  metFORMIN (GLUCOPHAGE-XR) 500 MG 24 hr tablet Take 1 tablet (500 mg total) by mouth 2 (two) times daily with a meal. 10/20/22 10/15/23 Yes Morene Crocker, MD  Multiple Vitamins-Minerals (ONE-A-DAY PROACTIVE 65+) TABS Take 1 tablet by mouth daily.   Yes [provider]  pantoprazole (PROTONIX) 40 MG tablet Take 1 tablet (40 mg total) by mouth 2 (two) times daily for 42 days, THEN 1 tablet (40 mg total) daily. 06/12/23 08/23/23 Yes Marrianne Mood, MD  vitamin E 180 MG (400 UNITS) capsule Take 400 Units by mouth daily.   Yes [provider]  XTANDI 40 MG capsule Take 160 mg by mouth  at bedtime. 11/19/19  Yes [provider]  dorzolamide (TRUSOPT) 2 % ophthalmic solution Place 1 drop into the right eye 2 (two) times daily. 11/11/18   [provider]    Family History Family History  Problem Relation Age of Onset   Kidney failure Brother 66       has been on HD since age 14    Edema Mother        Legs   Arthritis Sister        knees   Stroke Maternal Uncle        7s-80s   Cancer Neg Hx     Social History Social History   Tobacco Use   Smoking status: Former    Current packs/day: 0.00    Average packs/day: 0.5 packs/day for 30.0 years (15.0 ttl pk-yrs)    Types: Cigarettes    Start date: 10/04/1977    Quit date: 10/05/2007    Years  since quitting: 15.7   Smokeless tobacco: Never  Vaping Use   Vaping status: Never Used  Substance Use Topics   Alcohol use: Not Currently    Alcohol/week: 0.0 standard drinks of alcohol    Comment: no alcohol for five years (per conversation 2020)   Drug use: No    Types: "Crack" cocaine, Marijuana    Comment: per Epic note 2009hx of crack cocaine use 12 years ago per pt, no current marijuana use per pt     Allergies   Patient has no known allergies.   Review of Systems Review of Systems   Physical Exam Triage Vital Signs ED Triage Vitals [06/20/23 0826]  Encounter Vitals Group     BP (!) 176/110     Systolic BP Percentile      Diastolic BP Percentile      Pulse Rate 98     Resp 16     Temp 97.9 F (36.6 C)     Temp Source Oral     SpO2 98 %     Weight      Height      Head Circumference      Peak Flow      Pain Score      Pain Loc      Pain Education      Exclude from Growth Chart    No data found.  Updated Vital Signs BP (!) 176/110 (BP Location: Left Arm)   Pulse 98   Temp 97.9 F (36.6 C) (Oral)   Resp 16   SpO2 98%   Visual Acuity Right Eye Distance:   Left Eye Distance:   Bilateral Distance:    Right Eye Near:   Left Eye Near:    Bilateral Near:     Physical  Exam Vitals reviewed.  Constitutional:      General: He is not in acute distress.    Appearance: He is not ill-appearing, toxic-appearing or diaphoretic.  HENT:     Mouth/Throat:     Mouth: Mucous membranes are moist.     Comments: There is very mild pallor at this time. Eyes:     Extraocular Movements: Extraocular movements intact.     Pupils: Pupils are equal, round, and reactive to light.  Cardiovascular:     Rate and Rhythm: Normal rate and regular rhythm.     Heart sounds: No murmur heard. Pulmonary:     Effort: Pulmonary effort is normal. No respiratory distress.     Breath sounds: Normal breath sounds. No stridor. No wheezing, rhonchi or rales.  Abdominal:     Palpations: Abdomen is soft.     Tenderness: There is no abdominal tenderness.  Musculoskeletal:     Cervical back: Neck supple.     Comments: There is 1+ edema of the ankles.  Nonpitting    Lymphadenopathy:     Cervical: No cervical adenopathy.  Skin:    Coloration: Skin is not jaundiced or pale.  Neurological:     General: No focal deficit present.     Mental Status: He is alert and oriented to person, place, and time.  Psychiatric:        Behavior: Behavior normal.      UC Treatments / Results  Labs (all labs ordered are listed, but only abnormal results are displayed) Labs Reviewed - No data to display  EKG   Radiology No results found.  Procedures Procedures (including critical care time)  Medications Ordered in UC Medications - No data  to display  Initial Impression / Assessment and Plan / UC Course  I have reviewed the triage vital signs and the nursing notes.  Pertinent labs & imaging results that were available during my care of the patient were reviewed by me and considered in my medical decision making (see chart for details).      Lab reviewed from recent admission. At discharge his potassium was 3.8 and renal function was normal.  His latest hemoglobin was 8.8 on September 8.    BMP and CBC are drawn today.  If there is anything significantly worse or abnormal on his testing we will call him.  2-day supply of furosemide 20 mg plus potassium is sent into the pharmacy.  We discussed briefly lowering the sodium in his diet. Final Clinical Impressions(s) / UC Diagnoses   Final diagnoses:  None   Discharge Instructions   None    ED Prescriptions   None    PDMP not reviewed this encounter.   Zenia Resides, MD 06/20/23 9629    Zenia Resides, MD 06/20/23 [phone]

## 2023-06-20 NOTE — Discharge Instructions (Signed)
We have drawn blood to check your sodium and potassium and kidney function numbers, and also your blood counts..  Staff will notify you if anything is significantly abnormal on that, or if you are red blood counts are lower again.  Furosemide 20 mg 1 daily for 2 days as needed for swelling  Potassium 10 mEq--2 tablets daily on the days you take the furosemide.

## 2023-06-20 NOTE — ED Triage Notes (Signed)
Pt presents to the office with bilateral feet swelling and pain for several days.

## 2023-06-24 ENCOUNTER — Encounter: Payer: Self-pay | Admitting: Internal Medicine

## 2023-06-24 ENCOUNTER — Other Ambulatory Visit: Payer: Self-pay

## 2023-06-24 ENCOUNTER — Ambulatory Visit: Payer: 59 | Admitting: Internal Medicine

## 2023-06-24 VITALS — BP 146/71 | HR 94 | Temp 97.6°F | Ht 68.0 in | Wt 175.8 lb

## 2023-06-24 DIAGNOSIS — E119 Type 2 diabetes mellitus without complications: Secondary | ICD-10-CM

## 2023-06-24 DIAGNOSIS — Z23 Encounter for immunization: Secondary | ICD-10-CM

## 2023-06-24 DIAGNOSIS — C61 Malignant neoplasm of prostate: Secondary | ICD-10-CM

## 2023-06-24 DIAGNOSIS — R9389 Abnormal findings on diagnostic imaging of other specified body structures: Secondary | ICD-10-CM | POA: Diagnosis not present

## 2023-06-24 DIAGNOSIS — R600 Localized edema: Secondary | ICD-10-CM

## 2023-06-24 DIAGNOSIS — Z0000 Encounter for general adult medical examination without abnormal findings: Secondary | ICD-10-CM

## 2023-06-24 DIAGNOSIS — I10 Essential (primary) hypertension: Secondary | ICD-10-CM

## 2023-06-24 DIAGNOSIS — K250 Acute gastric ulcer with hemorrhage: Secondary | ICD-10-CM

## 2023-06-24 DIAGNOSIS — R410 Disorientation, unspecified: Secondary | ICD-10-CM | POA: Insufficient documentation

## 2023-06-24 LAB — POCT GLYCOSYLATED HEMOGLOBIN (HGB A1C): Hemoglobin A1C: 6.5 % — AB (ref 4.0–5.6)

## 2023-06-24 LAB — GLUCOSE, CAPILLARY: Glucose-Capillary: 442 mg/dL — ABNORMAL HIGH (ref 70–99)

## 2023-06-24 MED ORDER — EMPAGLIFLOZIN 10 MG PO TABS
10.0000 mg | ORAL_TABLET | Freq: Every day | ORAL | 3 refills | Status: DC
Start: 2023-06-24 — End: 2023-08-05

## 2023-06-24 MED ORDER — PANTOPRAZOLE SODIUM 40 MG PO TBEC
40.0000 mg | DELAYED_RELEASE_TABLET | Freq: Every day | ORAL | 1 refills | Status: DC
Start: 2023-06-24 — End: 2023-08-05

## 2023-06-24 MED ORDER — AMLODIPINE BESYLATE 10 MG PO TABS
10.0000 mg | ORAL_TABLET | Freq: Every day | ORAL | 11 refills | Status: AC
Start: 2023-06-24 — End: 2024-06-23

## 2023-06-24 NOTE — Patient Instructions (Signed)
William Jones,   It was a pleasure meeting you today.   I am ordering some imaging including an MRI of your brain, a CT of your chest, and an ultrasound of your heart. Please be on the lookout for a call from the schedulers.   I am starting a new medication called Jardiance for your blood sugar. Please take this once per day, and we will see you again in a month to see how this is working for you.   For your stomach bleeding, please take the Protonix once per day.   For your blood pressure, please take the Amlodipine 10mg  once per day.   It was a pleasure meeting you.   Dr Carlynn Purl

## 2023-06-24 NOTE — Assessment & Plan Note (Signed)
BP today is 148/74, similar on recheck. Current regimen is Amlodipine 5mg  daily, which patient has been taking.  -Increase amlodipine to 10mg  daily -Follow up appt in 4 weeks for BP recheck

## 2023-06-24 NOTE — Progress Notes (Signed)
Subjective:  CC: BP check, medication follow up  HPI:  [name] is a 72 y.o. male with a past medical history stated below and presents today for above. Please see problem based assessment and plan for additional details.  Past Medical History:  Diagnosis Date   Angiodysplasia of colon with hemorrhage 10/14/2020   Duodenitis    Essential hypertension 07/16/2015   Fatigue 11/06/2020   Glaucoma    H/O: substance abuse (HCC)    Hemorrhage of rectum and anus 10/14/2020   History of frostbite    History of prostate cancer 03/20/2014   Lost to f/u with [facility] in the past 2011 with elevated PSA >30 at that time.  Saw [facility] (Dr. Isabel Jones) on 03/13/14. Cancer Noted 03/13/14 office visit. Gleason score 7. Plan CT Ab/pelvis with contrast, bone scan    History of radiation therapy nov 2015 to feb 2016   Hyperlipidemia 11/20/2019   Iron deficiency anemia    Malignant tumor of prostate (HCC) 10/14/2020   Peripheral neuropathy    Prostate cancer (HCC) 03/05/14   Gleason 7, volume 45 gm   Rash and nonspecific skin eruption 08/04/2017   Rectal mass 09/15/2016   S/P radiation therapy 09/18/14-11/15/14   prostate/ 7800Gy/40sessions   Thrombocytopenia (HCC)     Current Outpatient Medications on File Prior to Visit  Medication Sig Dispense Refill   acetaminophen (TYLENOL) 500 MG tablet Take 2 tablets (1,000 mg total) by mouth every 6 (six) hours as needed for mild pain or moderate pain. 30 tablet 0   CALCIUM CITRATE PO Take 600 mg by mouth daily.     cholecalciferol (VITAMIN D3) 10 MCG (400 UNIT) TABS tablet Take 1,000 Units by mouth daily.     Ferrous Sulfate (IRON PO) Take 1 tablet by mouth daily.     furosemide (LASIX) 20 MG tablet Take 1 tablet (20 mg total) by mouth daily as needed for edema. 2 tablet 0   metFORMIN (GLUCOPHAGE-XR) 500 MG 24 hr tablet Take 1 tablet (500 mg total) by mouth 2 (two) times daily with a meal. 180 tablet 3   Multiple Vitamins-Minerals  (ONE-A-DAY PROACTIVE 65+) TABS Take 1 tablet by mouth daily.     potassium chloride (KLOR-CON) 10 MEQ tablet Take 2 tablets (20 mEq total) by mouth daily. On the days you take furosemide. 4 tablet 0   vitamin E 180 MG (400 UNITS) capsule Take 400 Units by mouth daily.     XTANDI 40 MG capsule Take 160 mg by mouth at bedtime.     No current facility-administered medications on file prior to visit.    Review of Systems: ROS negative except for as is noted on the assessment and plan.  Objective:   Vitals:   06/24/23 0859 06/24/23 0906  BP: (!) 148/74 (!) 146/71  Pulse: 98 94  Temp: 97.6 F (36.4 C)   TempSrc: Oral   SpO2: 100%   Weight: 175 lb 12.8 oz (79.7 kg)   Height: 5\' 8"  (1.727 m)     Physical Exam: Constitutional: well-appearing, in no acute distress HENT: normocephalic atraumatic, mucous membranes moist Eyes: conjunctiva non-erythematous Neck: supple Cardiovascular: regular rate and rhythm, 2/6 systolic murmur Pulmonary/Chest: normal work of breathing on room air, lungs clear to auscultation bilaterally Abdominal: soft, non-tender, non-distended MSK: normal bulk and tone Neurological: alert & oriented x 3, 5/5 strength in bilateral upper and lower extremities, normal gait Skin: warm and dry  Assessment & Plan:   Essential hypertension BP  today is 148/74, similar on recheck. Current regimen is Amlodipine 5mg  daily, which patient has been taking.  -Increase amlodipine to 10mg  daily -Follow up appt in 4 weeks for BP recheck  Acute gastric ulcer with hemorrhage Patient has recent admission for bleeding gastric ulcer, and was discharged on Protonix 40mg  BID. He states the Protonix has been causing him hallucinations and confusion, and hasn't been taking it at all. After discussion, he was willing to take it once per day and call the clinic if he continues to have side effects.  -Protonix 40mg  once per day prescribed -CBC today  Type 2 diabetes mellitus (HCC) BG today  is 442, A1c at last visit in January was 6.7. A1c today is 6.5. He has not been taking his prescribed metformin 500mg  BID as he feels it causes headaches. He denies any current nausea, vomiting. Does endorse fatigue. I am not concerned for acute DKA but will test for anion gap -BMP today  -Jardiance 10mg  prescribed -Follow up appointment in 1 month  Abnormal chest x-ray During recent admission, a CXR was performed which showed very mild mid right lung focal scarring and/or atelectasis, recommended CT chest to exclude presence of lung nodule -CT chest ordered  Bilateral lower extremity edema Patient went to urgent care recently with concerns for bilateral lower extremity edema. He was given Laxis tablets and recommended a low sodium diet. He has no recorded diagnosis of heart failure. At clinic today, he has 1+ pitting edema to the knee bilaterally. He denies shortness of breath or chest pain. Pulmonary exam is clear to auscultation bilaterally. A 2/6 systolic murmur is also identified on exam. Patient has had an echocardiogram ordered in the past for his murmur though never had study performed. Patient is amenable to have echo done now.  -Echocardiogram ordered    Patient seen with Dr. Cleda Daub  Monna Fam MD [facility]  PGY-[id number] Pager: [phone] Date 06/24/2023  Time 12:56 PM

## 2023-06-24 NOTE — Assessment & Plan Note (Addendum)
Patient went to urgent care recently with concerns for bilateral lower extremity edema. He was given Laxis tablets and recommended a low sodium diet. He has no recorded diagnosis of heart failure. At clinic today, he has 1+ pitting edema to the knee bilaterally. He denies shortness of breath or chest pain. Pulmonary exam is clear to auscultation bilaterally. A 2/6 systolic murmur is also identified on exam. Patient has had an echocardiogram ordered in the past for his murmur though never had study performed. Patient is amenable to have echo done now.  -Echocardiogram ordered

## 2023-06-24 NOTE — Assessment & Plan Note (Signed)
BG today is 442, A1c at last visit in January was 6.7. A1c today is 6.5. He has not been taking his prescribed metformin 500mg  BID as he feels it causes headaches. He denies any current nausea, vomiting. Does endorse fatigue. I am not concerned for acute DKA but will test for anion gap -BMP today  -Jardiance 10mg  prescribed -Follow up appointment in 1 month

## 2023-06-24 NOTE — Assessment & Plan Note (Signed)
Patient has concerns for confusion and hallucinations recently. He attributes this to his Protonix. Notably he is on Xtandi therapy for his metastatic

## 2023-06-24 NOTE — Assessment & Plan Note (Signed)
Patient has recent admission for bleeding gastric ulcer, and was discharged on Protonix 40mg  BID. He states the Protonix has been causing him hallucinations and confusion, and hasn't been taking it at all. After discussion, he was willing to take it once per day and call the clinic if he continues to have side effects.  -Protonix 40mg  once per day prescribed -CBC today

## 2023-06-24 NOTE — Assessment & Plan Note (Signed)
During recent admission, a CXR was performed which showed very mild mid right lung focal scarring and/or atelectasis, recommended CT chest to exclude presence of lung nodule -CT chest ordered

## 2023-06-26 LAB — BMP8+ANION GAP
Anion Gap: 14 mmol/L (ref 10.0–18.0)
BUN/Creatinine Ratio: 33 — ABNORMAL HIGH (ref 10–24)
BUN: 26 mg/dL (ref 8–27)
CO2: 25 mmol/L (ref 20–29)
Calcium: 9.8 mg/dL (ref 8.6–10.2)
Chloride: 94 mmol/L — ABNORMAL LOW (ref 96–106)
Creatinine, Ser: 0.78 mg/dL (ref 0.76–1.27)
Glucose: 449 mg/dL — ABNORMAL HIGH (ref 70–99)
Potassium: 4.4 mmol/L (ref 3.5–5.2)
Sodium: 133 mmol/L — ABNORMAL LOW (ref 134–144)
eGFR: 96 mL/min/{1.73_m2} (ref >59)

## 2023-06-26 LAB — CBC
Hematocrit: 33.8 % — ABNORMAL LOW (ref 37.5–51.0)
Hemoglobin: 10.6 g/dL — ABNORMAL LOW (ref 13.0–17.7)
MCH: 29.8 pg (ref 26.6–33.0)
MCHC: 31.4 g/dL — ABNORMAL LOW (ref 31.5–35.7)
MCV: 95 fL (ref 79–97)
Platelets: 262 10*3/uL (ref 150–450)
RBC: 3.56 x10E6/uL — ABNORMAL LOW (ref 4.14–5.80)
RDW: 14.3 % (ref 11.6–15.4)
WBC: 7 10*3/uL (ref 3.4–10.8)

## 2023-06-28 NOTE — Progress Notes (Signed)
Internal Medicine Clinic Attending  I was physically present during the key portions of the resident provided service and participated in the medical decision making of patient's management care. I reviewed pertinent patient test results.  The assessment, diagnosis, and plan were formulated together and I agree with the documentation in the resident's note.  Gust Rung, DO

## 2023-06-28 NOTE — Addendum Note (Signed)
Addended by: Gust Rung on: 06/28/2023 08:46 AM   Modules accepted: Level of Service

## 2023-06-29 ENCOUNTER — Emergency Department (HOSPITAL_COMMUNITY): Payer: 59

## 2023-06-29 ENCOUNTER — Inpatient Hospital Stay (HOSPITAL_COMMUNITY)
Admission: EM | Admit: 2023-06-29 | Discharge: 2023-07-05 | DRG: 436 | Disposition: A | Discharge status: Home / Self Care | Payer: 59 | Attending: Internal Medicine | Admitting: Internal Medicine

## 2023-06-29 ENCOUNTER — Other Ambulatory Visit: Payer: Self-pay

## 2023-06-29 ENCOUNTER — Encounter (HOSPITAL_COMMUNITY): Payer: Self-pay

## 2023-06-29 DIAGNOSIS — Z8546 Personal history of malignant neoplasm of prostate: Secondary | ICD-10-CM

## 2023-06-29 DIAGNOSIS — Z841 Family history of disorders of kidney and ureter: Secondary | ICD-10-CM

## 2023-06-29 DIAGNOSIS — N3000 Acute cystitis without hematuria: Secondary | ICD-10-CM | POA: Diagnosis present

## 2023-06-29 DIAGNOSIS — R7989 Other specified abnormal findings of blood chemistry: Secondary | ICD-10-CM | POA: Diagnosis present

## 2023-06-29 DIAGNOSIS — Z823 Family history of stroke: Secondary | ICD-10-CM

## 2023-06-29 DIAGNOSIS — D696 Thrombocytopenia, unspecified: Secondary | ICD-10-CM | POA: Diagnosis present

## 2023-06-29 DIAGNOSIS — D638 Anemia in other chronic diseases classified elsewhere: Secondary | ICD-10-CM | POA: Diagnosis present

## 2023-06-29 DIAGNOSIS — R579 Shock, unspecified: Secondary | ICD-10-CM

## 2023-06-29 DIAGNOSIS — C61 Malignant neoplasm of prostate: Secondary | ICD-10-CM | POA: Diagnosis present

## 2023-06-29 DIAGNOSIS — E8809 Other disorders of plasma-protein metabolism, not elsewhere classified: Secondary | ICD-10-CM | POA: Diagnosis present

## 2023-06-29 DIAGNOSIS — Z87891 Personal history of nicotine dependence: Secondary | ICD-10-CM

## 2023-06-29 DIAGNOSIS — E876 Hypokalemia: Secondary | ICD-10-CM | POA: Diagnosis not present

## 2023-06-29 DIAGNOSIS — E1142 Type 2 diabetes mellitus with diabetic polyneuropathy: Secondary | ICD-10-CM | POA: Diagnosis present

## 2023-06-29 DIAGNOSIS — Z923 Personal history of irradiation: Secondary | ICD-10-CM

## 2023-06-29 DIAGNOSIS — C7972 Secondary malignant neoplasm of left adrenal gland: Secondary | ICD-10-CM | POA: Diagnosis present

## 2023-06-29 DIAGNOSIS — H409 Unspecified glaucoma: Secondary | ICD-10-CM | POA: Diagnosis present

## 2023-06-29 DIAGNOSIS — I10 Essential (primary) hypertension: Secondary | ICD-10-CM | POA: Diagnosis present

## 2023-06-29 DIAGNOSIS — C787 Secondary malignant neoplasm of liver and intrahepatic bile duct: Principal | ICD-10-CM | POA: Diagnosis present

## 2023-06-29 DIAGNOSIS — Z7984 Long term (current) use of oral hypoglycemic drugs: Secondary | ICD-10-CM

## 2023-06-29 DIAGNOSIS — Z8261 Family history of arthritis: Secondary | ICD-10-CM

## 2023-06-29 DIAGNOSIS — E785 Hyperlipidemia, unspecified: Secondary | ICD-10-CM | POA: Diagnosis present

## 2023-06-29 DIAGNOSIS — R531 Weakness: Principal | ICD-10-CM

## 2023-06-29 DIAGNOSIS — K769 Liver disease, unspecified: Secondary | ICD-10-CM

## 2023-06-29 DIAGNOSIS — Z79899 Other long term (current) drug therapy: Secondary | ICD-10-CM

## 2023-06-29 DIAGNOSIS — Z8711 Personal history of peptic ulcer disease: Secondary | ICD-10-CM

## 2023-06-29 DIAGNOSIS — C7951 Secondary malignant neoplasm of bone: Secondary | ICD-10-CM | POA: Diagnosis present

## 2023-06-29 DIAGNOSIS — R81 Glycosuria: Secondary | ICD-10-CM | POA: Diagnosis present

## 2023-06-29 DIAGNOSIS — C7971 Secondary malignant neoplasm of right adrenal gland: Secondary | ICD-10-CM | POA: Diagnosis present

## 2023-06-29 DIAGNOSIS — K766 Portal hypertension: Secondary | ICD-10-CM | POA: Diagnosis present

## 2023-06-29 LAB — COMPREHENSIVE METABOLIC PANEL
ALT: 112 U/L — ABNORMAL HIGH (ref 0–44)
AST: 40 U/L (ref 15–41)
Albumin: 2.7 g/dL — ABNORMAL LOW (ref 3.5–5.0)
Alkaline Phosphatase: 79 U/L (ref 38–126)
Anion gap: 17 — ABNORMAL HIGH (ref 5–15)
BUN: 25 mg/dL — ABNORMAL HIGH (ref 8–23)
CO2: 21 mmol/L — ABNORMAL LOW (ref 22–32)
Calcium: 7.3 mg/dL — ABNORMAL LOW (ref 8.9–10.3)
Chloride: 98 mmol/L (ref 98–111)
Creatinine, Ser: 0.82 mg/dL (ref 0.61–1.24)
GFR, Estimated: >60 mL/min (ref >60)
Glucose, Bld: 383 mg/dL — ABNORMAL HIGH (ref 70–99)
Potassium: 3 mmol/L — ABNORMAL LOW (ref 3.5–5.1)
Sodium: 136 mmol/L (ref 135–145)
Total Bilirubin: 1 mg/dL (ref 0.3–1.2)
Total Protein: 5.4 g/dL — ABNORMAL LOW (ref 6.5–8.1)

## 2023-06-29 LAB — I-STAT VENOUS BLOOD GAS, ED
Acid-Base Excess: 1 mmol/L (ref 0.0–2.0)
Bicarbonate: 24.3 mmol/L (ref 20.0–28.0)
Calcium, Ion: 0.96 mmol/L — ABNORMAL LOW (ref 1.15–1.40)
HCT: 30 % — ABNORMAL LOW (ref 39.0–52.0)
Hemoglobin: 10.2 g/dL — ABNORMAL LOW (ref 13.0–17.0)
O2 Saturation: 99 %
Potassium: 3.1 mmol/L — ABNORMAL LOW (ref 3.5–5.1)
Sodium: 136 mmol/L (ref 135–145)
TCO2: 25 mmol/L (ref 22–32)
pCO2, Ven: 32.2 mmHg — ABNORMAL LOW (ref 44–60)
pH, Ven: 7.485 — ABNORMAL HIGH (ref 7.25–7.43)
pO2, Ven: 108 mmHg — ABNORMAL HIGH (ref 32–45)

## 2023-06-29 LAB — CBC WITH DIFFERENTIAL/PLATELET
Abs Immature Granulocytes: 0.02 10*3/uL (ref 0.00–0.07)
Basophils Absolute: 0 10*3/uL (ref 0.0–0.1)
Basophils Relative: 0 %
Eosinophils Absolute: 0 10*3/uL (ref 0.0–0.5)
Eosinophils Relative: 0 %
HCT: 30.7 % — ABNORMAL LOW (ref 39.0–52.0)
Hemoglobin: 10.2 g/dL — ABNORMAL LOW (ref 13.0–17.0)
Immature Granulocytes: 0 %
Lymphocytes Relative: 4 %
Lymphs Abs: 0.2 10*3/uL — ABNORMAL LOW (ref 0.7–4.0)
MCH: 30.2 pg (ref 26.0–34.0)
MCHC: 33.2 g/dL (ref 30.0–36.0)
MCV: 90.8 fL (ref 80.0–100.0)
Monocytes Absolute: 0.6 10*3/uL (ref 0.1–1.0)
Monocytes Relative: 13 %
Neutro Abs: 3.8 10*3/uL (ref 1.7–7.7)
Neutrophils Relative %: 83 %
Platelets: 163 10*3/uL (ref 150–400)
RBC: 3.38 MIL/uL — ABNORMAL LOW (ref 4.22–5.81)
RDW: 15.4 % (ref 11.5–15.5)
WBC: 4.6 10*3/uL (ref 4.0–10.5)
nRBC: 0 % (ref 0.0–0.2)

## 2023-06-29 LAB — URINALYSIS, W/ REFLEX TO CULTURE (INFECTION SUSPECTED)
Bilirubin Urine: NEGATIVE
Glucose, UA: >=500 mg/dL — AB
Hgb urine dipstick: NEGATIVE
Ketones, ur: 20 mg/dL — AB
Leukocytes,Ua: NEGATIVE
Nitrite: NEGATIVE
Protein, ur: NEGATIVE mg/dL
Specific Gravity, Urine: 1.023 (ref 1.005–1.030)
pH: 6 (ref 5.0–8.0)

## 2023-06-29 LAB — TROPONIN I (HIGH SENSITIVITY): Troponin I (High Sensitivity): 24 ng/L — ABNORMAL HIGH (ref <18)

## 2023-06-29 LAB — CK: Total CK: 153 U/L (ref 49–397)

## 2023-06-29 LAB — BRAIN NATRIURETIC PEPTIDE: B Natriuretic Peptide: 156.4 pg/mL — ABNORMAL HIGH (ref 0.0–100.0)

## 2023-06-29 MED ORDER — SODIUM CHLORIDE 0.9 % IV SOLN
1.0000 g | Freq: Once | INTRAVENOUS | Status: AC
  Administered 2023-06-30: 1 g via INTRAVENOUS
  Filled 2023-06-29: qty 10

## 2023-06-29 MED ORDER — POTASSIUM CHLORIDE CRYS ER 20 MEQ PO TBCR
40.0000 meq | EXTENDED_RELEASE_TABLET | Freq: Once | ORAL | Status: AC
  Administered 2023-06-30: 40 meq via ORAL
  Filled 2023-06-29: qty 2

## 2023-06-29 MED ORDER — FUROSEMIDE 10 MG/ML IJ SOLN
40.0000 mg | INTRAMUSCULAR | Status: AC
  Administered 2023-06-30: 40 mg via INTRAVENOUS
  Filled 2023-06-29: qty 4

## 2023-06-29 NOTE — ED Provider Notes (Signed)
[facility]PARTMENT AT [facility] Provider Note   CSN: [id number] Arrival date & time: 06/29/23  2149     History {Add pertinent medical, surgical, social history, OB history to HPI:1} Chief Complaint  Patient presents with   Leg Swelling   Weakness    William Jones is a 72 y.o. male.   Weakness  72 year old male with history of hypertension, iron deficiency anemia, diabetes, glaucoma, hyperlipidemia, presenting to the ED for generalized weakness.  States for the past 5 days he has been feeling weak all over, particularly in the legs when trying to walk.  He states he is having to steady himself along walls and today got stuck on the commode and could not get back up.  He has not had any falls or other trauma.  He denies any nausea, vomiting, chest pain, or shortness of breath.  He is having ongoing ankle/foot edema for the past several weeks, saw PCP for this and encouraged low-sodium diet and some medication adjustments but he does not feel like it is improving.  Patient does admit he did not take his blood pressure medication or Jardiance today because he gets adverse side effects.  He was recently taken off metformin due to side effects as well.  He does report he has been eating and drinking fine.  He does live alone, does not use cane or walker generally.  Home Medications Prior to Admission medications   Medication Sig Start Date End Date Taking? Authorizing Provider  acetaminophen (TYLENOL) 500 MG tablet Take 2 tablets (1,000 mg total) by mouth every 6 (six) hours as needed for mild pain or moderate pain. 05/01/21   Fayrene Helper, PA-C  amLODipine (NORVASC) 10 MG tablet Take 1 tablet (10 mg total) by mouth daily. 06/24/23 06/23/24  Monna Fam, MD  CALCIUM CITRATE PO Take 600 mg by mouth daily.    [provider]  cholecalciferol (VITAMIN D3) 10 MCG (400 UNIT) TABS tablet Take 1,000 Units by mouth daily.    [provider]   empagliflozin (JARDIANCE) 10 MG TABS tablet Take 1 tablet (10 mg total) by mouth daily before breakfast. 06/24/23   Monna Fam, MD  Ferrous Sulfate (IRON PO) Take 1 tablet by mouth daily.    [provider]  furosemide (LASIX) 20 MG tablet Take 1 tablet (20 mg total) by mouth daily as needed for edema. 06/20/23   Zenia Resides, MD  metFORMIN (GLUCOPHAGE-XR) 500 MG 24 hr tablet Take 1 tablet (500 mg total) by mouth 2 (two) times daily with a meal. 10/20/22 10/15/23  Morene Crocker, MD  Multiple Vitamins-Minerals (ONE-A-DAY PROACTIVE 65+) TABS Take 1 tablet by mouth daily.    [provider]  pantoprazole (PROTONIX) 40 MG tablet Take 1 tablet (40 mg total) by mouth daily. 06/24/23 06/23/24  Monna Fam, MD  potassium chloride (KLOR-CON) 10 MEQ tablet Take 2 tablets (20 mEq total) by mouth daily. On the days you take furosemide. 06/20/23   Zenia Resides, MD  vitamin E 180 MG (400 UNITS) capsule Take 400 Units by mouth daily.    [provider]  XTANDI 40 MG capsule Take 160 mg by mouth at bedtime. 11/19/19   [provider]      Allergies    Patient has no known allergies.    Review of Systems   Review of Systems  Neurological:  Positive for weakness.  All other systems reviewed and are negative.   Physical Exam Updated Vital Signs  BP (!) 165/100 (BP Location: Right Arm)   Pulse 95   Temp (!) 97.5 F (36.4 C) (Oral)   Resp (!) 23   Ht 5\' 8"  (1.727 m)   Wt 79.7 kg   SpO2 100%   BMI 26.72 kg/m   Physical Exam Vitals and nursing note reviewed.  Constitutional:      Appearance: He is well-developed.  HENT:     Head: Normocephalic and atraumatic.  Eyes:     Conjunctiva/sclera: Conjunctivae normal.     Pupils: Pupils are equal, round, and reactive to light.  Cardiovascular:     Rate and Rhythm: Normal rate and regular rhythm.     Heart sounds: Normal heart sounds.  Pulmonary:     Effort: Pulmonary effort is normal.     Breath  sounds: Normal breath sounds.  Abdominal:     General: Bowel sounds are normal.     Palpations: Abdomen is soft.  Musculoskeletal:        General: Normal range of motion.     Cervical back: Normal range of motion.     Comments: 2+ pitting edema of ankles and bilateral feet, grossly symmetric without overlying skin changes  Skin:    General: Skin is warm and dry.  Neurological:     Mental Status: He is alert and oriented to person, place, and time.     Comments: AAOx3, answering questions and following commands appropriately; equal strength UE and LE bilaterally; CN grossly intact; moves all extremities appropriately without ataxia; no focal neuro deficits or facial asymmetry appreciated     ED Results / Procedures / Treatments   Labs (all labs ordered are listed, but only abnormal results are displayed) Labs Reviewed  CBC WITH DIFFERENTIAL/PLATELET - Abnormal; Notable for the following components:      Result Value   RBC 3.38 (*)    Hemoglobin 10.2 (*)    HCT 30.7 (*)    Lymphs Abs 0.2 (*)    All other components within normal limits  COMPREHENSIVE METABOLIC PANEL - Abnormal; Notable for the following components:   Potassium 3.0 (*)    CO2 21 (*)    Glucose, Bld 383 (*)    BUN 25 (*)    Calcium 7.3 (*)    Total Protein 5.4 (*)    Albumin 2.7 (*)    ALT 112 (*)    Anion gap 17 (*)    All other components within normal limits  BRAIN NATRIURETIC PEPTIDE - Abnormal; Notable for the following components:   B Natriuretic Peptide 156.4 (*)    All other components within normal limits  URINALYSIS, W/ REFLEX TO CULTURE (INFECTION SUSPECTED) - Abnormal; Notable for the following components:   Glucose, UA >=500 (*)    Ketones, ur 20 (*)    Bacteria, UA MANY (*)    All other components within normal limits  I-STAT VENOUS BLOOD GAS, ED - Abnormal; Notable for the following components:   pH, Ven 7.485 (*)    pCO2, Ven 32.2 (*)    pO2, Ven 108 (*)    Potassium 3.1 (*)    Calcium,  Ion 0.96 (*)    HCT 30.0 (*)    Hemoglobin 10.2 (*)    All other components within normal limits  TROPONIN I (HIGH SENSITIVITY) - Abnormal; Notable for the following components:   Troponin I (High Sensitivity) 24 (*)    All other components within normal limits  CK    EKG None  Radiology DG Chest  Port 1 View  Result Date: 06/29/2023 CLINICAL DATA:  Weakness EXAM: PORTABLE CHEST 1 VIEW COMPARISON:  None Available. FINDINGS: The heart size and mediastinal contours are within normal limits. Both lungs are clear. The visualized skeletal structures are unremarkable. IMPRESSION: No active disease. Electronically Signed   By: Darliss Cheney M.D.   On: 06/29/2023 23:28    Procedures Procedures  {Document cardiac monitor, telemetry assessment procedure when appropriate:1}  Medications Ordered in ED Medications - No data to display  ED Course/ Medical Decision Making/ A&P   {   Click here for ABCD2, HEART and other calculatorsREFRESH Note before signing :1}                              Medical Decision Making Amount and/or Complexity of Data Reviewed Labs: ordered. Radiology: ordered and independent interpretation performed. ECG/medicine tests: ordered and independent interpretation performed.  Risk Prescription drug management.   72 year old male presenting to the ED with 5 days of generalized weakness.  He has had some ongoing ankle/foot edema as well.  He denies any chest pain or shortness of breath.  Has not taken his DM or HTN meds today, state they make him feel bad.  He is awake, alert, oriented here.  He does have 2+ pitting edema of the feet and ankles.  His lung sounds are grossly clear.  Will check labs, CXR.  EKG without acute ischemia.  Work-up as above--no leukocytosis.  Chronic anemia, appears at baseline.  He has hyper glycemic with minimally low bicarb at 21, gap is 17.  K+ low at 3.0.  VBG with normal pH.  BNP 154, trop 24.  No acute changes on EKG, suspect likely  some new CHF with demand ischemia.  UA also many bacteria, culture pending.  Given dose of IV lasix, Kdur, and IV rocephin.  He will require admission.   Final Clinical Impression(s) / ED Diagnoses Final diagnoses:  None    Rx / DC Orders ED Discharge Orders     None

## 2023-06-29 NOTE — ED Notes (Signed)
I stat VBG collected and given to lab tech

## 2023-06-29 NOTE — ED Triage Notes (Addendum)
PT bib by [facility] from home. For the past 5 days PT has noticed feet swelling and generalized weakness all over his body.PT has no pain, SOB, n/v/d just stiffness in the lower body . CBG:445 but PT did not take jardiance this am because it makes him feel sluggish and he can't move around. PT also has HTN and does not take his BP medication because it makes him feel funny.150L of normal saline administered by EMS.

## 2023-06-30 ENCOUNTER — Inpatient Hospital Stay (HOSPITAL_COMMUNITY): Payer: 59

## 2023-06-30 ENCOUNTER — Observation Stay (HOSPITAL_COMMUNITY): Payer: 59

## 2023-06-30 ENCOUNTER — Observation Stay (HOSPITAL_BASED_OUTPATIENT_CLINIC_OR_DEPARTMENT_OTHER): Payer: 59

## 2023-06-30 DIAGNOSIS — R579 Shock, unspecified: Secondary | ICD-10-CM

## 2023-06-30 DIAGNOSIS — K766 Portal hypertension: Secondary | ICD-10-CM | POA: Diagnosis present

## 2023-06-30 DIAGNOSIS — R531 Weakness: Principal | ICD-10-CM

## 2023-06-30 DIAGNOSIS — H409 Unspecified glaucoma: Secondary | ICD-10-CM | POA: Diagnosis present

## 2023-06-30 DIAGNOSIS — D696 Thrombocytopenia, unspecified: Secondary | ICD-10-CM | POA: Diagnosis present

## 2023-06-30 DIAGNOSIS — C7970 Secondary malignant neoplasm of unspecified adrenal gland: Secondary | ICD-10-CM | POA: Diagnosis not present

## 2023-06-30 DIAGNOSIS — C7972 Secondary malignant neoplasm of left adrenal gland: Secondary | ICD-10-CM | POA: Diagnosis present

## 2023-06-30 DIAGNOSIS — R600 Localized edema: Secondary | ICD-10-CM | POA: Diagnosis not present

## 2023-06-30 DIAGNOSIS — R2243 Localized swelling, mass and lump, lower limb, bilateral: Secondary | ICD-10-CM

## 2023-06-30 DIAGNOSIS — R7989 Other specified abnormal findings of blood chemistry: Secondary | ICD-10-CM | POA: Diagnosis present

## 2023-06-30 DIAGNOSIS — E876 Hypokalemia: Secondary | ICD-10-CM | POA: Diagnosis present

## 2023-06-30 DIAGNOSIS — E8809 Other disorders of plasma-protein metabolism, not elsewhere classified: Secondary | ICD-10-CM | POA: Diagnosis present

## 2023-06-30 DIAGNOSIS — Z79899 Other long term (current) drug therapy: Secondary | ICD-10-CM | POA: Diagnosis not present

## 2023-06-30 DIAGNOSIS — E785 Hyperlipidemia, unspecified: Secondary | ICD-10-CM | POA: Diagnosis present

## 2023-06-30 DIAGNOSIS — C7951 Secondary malignant neoplasm of bone: Secondary | ICD-10-CM | POA: Diagnosis present

## 2023-06-30 DIAGNOSIS — R0609 Other forms of dyspnea: Secondary | ICD-10-CM | POA: Diagnosis not present

## 2023-06-30 DIAGNOSIS — I10 Essential (primary) hypertension: Secondary | ICD-10-CM | POA: Diagnosis present

## 2023-06-30 DIAGNOSIS — Z841 Family history of disorders of kidney and ureter: Secondary | ICD-10-CM | POA: Diagnosis not present

## 2023-06-30 DIAGNOSIS — Z7984 Long term (current) use of oral hypoglycemic drugs: Secondary | ICD-10-CM | POA: Diagnosis not present

## 2023-06-30 DIAGNOSIS — E1142 Type 2 diabetes mellitus with diabetic polyneuropathy: Secondary | ICD-10-CM | POA: Diagnosis present

## 2023-06-30 DIAGNOSIS — C61 Malignant neoplasm of prostate: Secondary | ICD-10-CM | POA: Diagnosis present

## 2023-06-30 DIAGNOSIS — Z87891 Personal history of nicotine dependence: Secondary | ICD-10-CM | POA: Diagnosis not present

## 2023-06-30 DIAGNOSIS — Z923 Personal history of irradiation: Secondary | ICD-10-CM | POA: Diagnosis not present

## 2023-06-30 DIAGNOSIS — Z8546 Personal history of malignant neoplasm of prostate: Secondary | ICD-10-CM | POA: Diagnosis not present

## 2023-06-30 DIAGNOSIS — Z823 Family history of stroke: Secondary | ICD-10-CM | POA: Diagnosis not present

## 2023-06-30 DIAGNOSIS — K769 Liver disease, unspecified: Secondary | ICD-10-CM | POA: Diagnosis not present

## 2023-06-30 DIAGNOSIS — C7971 Secondary malignant neoplasm of right adrenal gland: Secondary | ICD-10-CM | POA: Diagnosis present

## 2023-06-30 DIAGNOSIS — D638 Anemia in other chronic diseases classified elsewhere: Secondary | ICD-10-CM | POA: Diagnosis present

## 2023-06-30 DIAGNOSIS — C787 Secondary malignant neoplasm of liver and intrahepatic bile duct: Secondary | ICD-10-CM | POA: Diagnosis present

## 2023-06-30 DIAGNOSIS — N3000 Acute cystitis without hematuria: Secondary | ICD-10-CM | POA: Diagnosis present

## 2023-06-30 LAB — TSH: TSH: 1.254 u[IU]/mL (ref 0.350–4.500)

## 2023-06-30 LAB — CBC
HCT: 32.7 % — ABNORMAL LOW (ref 39.0–52.0)
Hemoglobin: 11 g/dL — ABNORMAL LOW (ref 13.0–17.0)
MCH: 30.7 pg (ref 26.0–34.0)
MCHC: 33.6 g/dL (ref 30.0–36.0)
MCV: 91.3 fL (ref 80.0–100.0)
Platelets: 161 10*3/uL (ref 150–400)
RBC: 3.58 MIL/uL — ABNORMAL LOW (ref 4.22–5.81)
RDW: 15.5 % (ref 11.5–15.5)
WBC: 4.4 10*3/uL (ref 4.0–10.5)
nRBC: 0 % (ref 0.0–0.2)

## 2023-06-30 LAB — GLUCOSE, CAPILLARY
Glucose-Capillary: 311 mg/dL — ABNORMAL HIGH (ref 70–99)
Glucose-Capillary: 305 mg/dL — ABNORMAL HIGH (ref 70–99)
Glucose-Capillary: 180 mg/dL — ABNORMAL HIGH (ref 70–99)
Glucose-Capillary: 428 mg/dL — ABNORMAL HIGH (ref 70–99)
Glucose-Capillary: 434 mg/dL — ABNORMAL HIGH (ref 70–99)
Glucose-Capillary: 494 mg/dL — ABNORMAL HIGH (ref 70–99)
Glucose-Capillary: 387 mg/dL — ABNORMAL HIGH (ref 70–99)

## 2023-06-30 LAB — ECHOCARDIOGRAM COMPLETE
AR max vel: 2.66 cm2
AV Area VTI: 3 cm2
AV Area mean vel: 2.63 cm2
AV Mean grad: 5 mmHg
AV Peak grad: 11.3 mmHg
Ao pk vel: 1.68 m/s
Area-P 1/2: 2.08 cm2
Height: 68 in
S' Lateral: 2.1 cm
Weight: 2811.31 oz

## 2023-06-30 LAB — COMPREHENSIVE METABOLIC PANEL
ALT: 118 U/L — ABNORMAL HIGH (ref 0–44)
AST: 45 U/L — ABNORMAL HIGH (ref 15–41)
Albumin: 2.9 g/dL — ABNORMAL LOW (ref 3.5–5.0)
Alkaline Phosphatase: 85 U/L (ref 38–126)
Anion gap: 16 — ABNORMAL HIGH (ref 5–15)
BUN: 19 mg/dL (ref 8–23)
CO2: 26 mmol/L (ref 22–32)
Calcium: 7.2 mg/dL — ABNORMAL LOW (ref 8.9–10.3)
Chloride: 97 mmol/L — ABNORMAL LOW (ref 98–111)
Creatinine, Ser: 0.77 mg/dL (ref 0.61–1.24)
GFR, Estimated: >60 mL/min (ref >60)
Glucose, Bld: 329 mg/dL — ABNORMAL HIGH (ref 70–99)
Potassium: 2.8 mmol/L — ABNORMAL LOW (ref 3.5–5.1)
Sodium: 139 mmol/L (ref 135–145)
Total Bilirubin: 1.1 mg/dL (ref 0.3–1.2)
Total Protein: 5.9 g/dL — ABNORMAL LOW (ref 6.5–8.1)

## 2023-06-30 LAB — MAGNESIUM: Magnesium: 1.9 mg/dL (ref 1.7–2.4)

## 2023-06-30 LAB — LACTIC ACID, PLASMA: Lactic Acid, Venous: 1.3 mmol/L (ref 0.5–1.9)

## 2023-06-30 LAB — TROPONIN I (HIGH SENSITIVITY): Troponin I (High Sensitivity): 25 ng/L — ABNORMAL HIGH (ref <18)

## 2023-06-30 LAB — POTASSIUM: Potassium: 3.5 mmol/L (ref 3.5–5.1)

## 2023-06-30 LAB — PROTIME-INR
INR: 1.1 (ref 0.8–1.2)
Prothrombin Time: 13.9 seconds (ref 11.4–15.2)

## 2023-06-30 MED ORDER — MAGNESIUM SULFATE 2 GM/50ML IV SOLN
2.0000 g | Freq: Once | INTRAVENOUS | Status: AC
  Administered 2023-06-30: 2 g via INTRAVENOUS
  Filled 2023-06-30: qty 50

## 2023-06-30 MED ORDER — FUROSEMIDE 10 MG/ML IJ SOLN
40.0000 mg | Freq: Once | INTRAMUSCULAR | Status: AC
  Administered 2023-06-30: 40 mg via INTRAVENOUS
  Filled 2023-06-30: qty 4

## 2023-06-30 MED ORDER — CALCIUM GLUCONATE-NACL 2-0.675 GM/100ML-% IV SOLN
2.0000 g | Freq: Once | INTRAVENOUS | Status: AC
  Administered 2023-06-30: 2000 mg via INTRAVENOUS
  Filled 2023-06-30: qty 100

## 2023-06-30 MED ORDER — FUROSEMIDE 10 MG/ML IJ SOLN: 40.0000 mg | INTRAMUSCULAR | Status: DC

## 2023-06-30 MED ORDER — GADOBUTROL 1 MMOL/ML IV SOLN
8.0000 mL | Freq: Once | INTRAVENOUS | Status: AC | PRN
  Administered 2023-06-30: 8 mL via INTRAVENOUS

## 2023-06-30 MED ORDER — TIMOLOL MALEATE 0.5 % OP SOLN
1.0000 [drp] | Freq: Two times a day (BID) | OPHTHALMIC | Status: DC
  Administered 2023-06-30 – 2023-07-05 (×9): 1 [drp] via OPHTHALMIC

## 2023-06-30 MED ORDER — DORZOLAMIDE HCL 2 % OP SOLN
1.0000 [drp] | Freq: Two times a day (BID) | OPHTHALMIC | Status: DC
  Administered 2023-06-30 – 2023-07-05 (×10): 1 [drp] via OPHTHALMIC
  Filled 2023-06-30: qty 10

## 2023-06-30 MED ORDER — LOSARTAN POTASSIUM 50 MG PO TABS
50.0000 mg | ORAL_TABLET | Freq: Every day | ORAL | Status: DC
  Administered 2023-07-01 – 2023-07-05 (×5): 50 mg via ORAL
  Filled 2023-06-30 (×5): qty 1

## 2023-06-30 MED ORDER — IOHEXOL 350 MG/ML SOLN
75.0000 mL | Freq: Once | INTRAVENOUS | Status: AC | PRN
  Administered 2023-06-30: 75 mL via INTRAVENOUS

## 2023-06-30 MED ORDER — ENOXAPARIN SODIUM 40 MG/0.4ML IJ SOSY
40.0000 mg | PREFILLED_SYRINGE | INTRAMUSCULAR | Status: DC
  Administered 2023-06-30 – 2023-07-02 (×3): 40 mg via SUBCUTANEOUS
  Filled 2023-06-30 (×4): qty 0.4

## 2023-06-30 MED ORDER — INSULIN ASPART 100 UNIT/ML IJ SOLN
0.0000 [IU] | Freq: Every day | INTRAMUSCULAR | Status: DC
  Administered 2023-06-30: 5 [IU] via SUBCUTANEOUS
  Administered 2023-07-01: 2 [IU] via SUBCUTANEOUS

## 2023-06-30 MED ORDER — ENSURE ENLIVE PO LIQD
237.0000 mL | Freq: Three times a day (TID) | ORAL | Status: DC
  Administered 2023-06-30 – 2023-07-05 (×11): 237 mL via ORAL

## 2023-06-30 MED ORDER — POTASSIUM CHLORIDE CRYS ER 20 MEQ PO TBCR
40.0000 meq | EXTENDED_RELEASE_TABLET | Freq: Two times a day (BID) | ORAL | Status: AC
  Administered 2023-06-30 (×2): 40 meq via ORAL
  Filled 2023-06-30 (×2): qty 2

## 2023-06-30 MED ORDER — PANTOPRAZOLE SODIUM 40 MG PO TBEC
40.0000 mg | DELAYED_RELEASE_TABLET | Freq: Every day | ORAL | Status: DC
  Administered 2023-06-30 – 2023-07-05 (×6): 40 mg via ORAL
  Filled 2023-06-30 (×6): qty 1

## 2023-06-30 MED ORDER — ENZALUTAMIDE 40 MG PO CAPS: 160.0000 mg | ORAL_CAPSULE | Freq: Every day | ORAL | Status: DC

## 2023-06-30 MED ORDER — LOSARTAN POTASSIUM 50 MG PO TABS
25.0000 mg | ORAL_TABLET | Freq: Once | ORAL | Status: AC
  Administered 2023-06-30: 25 mg via ORAL
  Filled 2023-06-30: qty 1

## 2023-06-30 MED ORDER — FERROUS SULFATE 325 (65 FE) MG PO TABS
325.0000 mg | ORAL_TABLET | Freq: Every day | ORAL | Status: DC
  Administered 2023-06-30 – 2023-07-05 (×6): 325 mg via ORAL
  Filled 2023-06-30 (×6): qty 1

## 2023-06-30 MED ORDER — INSULIN ASPART 100 UNIT/ML IJ SOLN
0.0000 [IU] | Freq: Three times a day (TID) | INTRAMUSCULAR | Status: DC
  Administered 2023-06-30: 15 [IU] via SUBCUTANEOUS
  Administered 2023-06-30: 11 [IU] via SUBCUTANEOUS
  Administered 2023-06-30: 3 [IU] via SUBCUTANEOUS
  Administered 2023-07-01: 15 [IU] via SUBCUTANEOUS
  Administered 2023-07-01 (×2): 11 [IU] via SUBCUTANEOUS
  Administered 2023-07-02 (×2): 5 [IU] via SUBCUTANEOUS
  Administered 2023-07-02: 3 [IU] via SUBCUTANEOUS
  Administered 2023-07-03: 2 [IU] via SUBCUTANEOUS
  Administered 2023-07-03 (×2): 3 [IU] via SUBCUTANEOUS
  Administered 2023-07-04: 11 [IU] via SUBCUTANEOUS
  Administered 2023-07-04 – 2023-07-05 (×2): 3 [IU] via SUBCUTANEOUS

## 2023-06-30 MED ORDER — LOSARTAN POTASSIUM 50 MG PO TABS
25.0000 mg | ORAL_TABLET | Freq: Every day | ORAL | Status: DC
  Administered 2023-06-30: 25 mg via ORAL
  Filled 2023-06-30: qty 1

## 2023-06-30 NOTE — Progress Notes (Signed)
Received call from Dr. Sherrilee Gilles regarding patient's FSBS. Dr. Sherrilee Gilles requests that PM shift check 2200 FSBS and if this is greater than 400 please call PM MD.

## 2023-06-30 NOTE — Evaluation (Signed)
Occupational Therapy Evaluation Patient Details Name: William Jones MRN: [id number] DOB: [birth year] Today's Date: 06/30/2023   History of Present Illness Pt is 73 yo arrived by EMS. Pt has noticed feet swelling and generalized weakness. Pt had high CBG and HTN on arrival. Recent admission on 9/8 for GI bleed. PMH: angiodysplasia of colon with hemorrhage, duodenitis, HTN, glaucoma, hemorrhage of rectum and anus, hx prostate cancer, hyperlipidemia, iron deficiency anemia, malignant tumor of prostate, peripheral neuropathy, rectal mass, thrombocytopenia, DM.   Clinical Impression   PTA, pt lives with significant other and typically completely independent with ADLs, IADLs and mobility. Pt works part time in custodial work. Pt presents now with deficits in endurance, dynamic standing balance, BLE edema and strength. Pt able to mobilize in room and manage ADLs without physical assistance. Some balance deficits noted with longer distance mobility. Emphasis on BLE edema mgmt and elevating legs when at rest. Provided handout to maximize carryover. Anticipate no OT needs at DC but will follow while admitted.       If plan is discharge home, recommend the following: Help with stairs or ramp for entrance    Functional Status Assessment  Patient has had a recent decline in their functional status and demonstrates the ability to make significant improvements in function in a reasonable and predictable amount of time.  Equipment Recommendations  None recommended by OT    Recommendations for Other Services       Precautions / Restrictions Precautions Precautions: Fall Restrictions Weight Bearing Restrictions: No      Mobility Bed Mobility               General bed mobility comments: in chair on entry    Transfers Overall transfer level: Modified independent Equipment used: None               General transfer comment: Pt able to stand from elevated surface without AD or  LOB      Balance Overall balance assessment: Needs assistance Sitting-balance support: Single extremity supported Sitting balance-Leahy Scale: Good     Standing balance support: Single extremity supported, Bilateral upper extremity supported, No upper extremity supported, During functional activity Standing balance-Leahy Scale: Fair Standing balance comment: fair to good-. able to mobilize without AD short distance but one LOB with CGA with longer distance                           ADL either performed or assessed with clinical judgement   ADL Overall ADL's : Needs assistance/impaired Eating/Feeding: Independent   Grooming: Modified independent;Standing;Oral care Grooming Details (indicate cue type and reason): standing at sink without LOB or issues Upper Body Bathing: Modified independent   Lower Body Bathing: Supervison/ safety   Upper Body Dressing : Modified independent   Lower Body Dressing: Supervision/safety   Toilet Transfer: Supervision/safety   Toileting- Clothing Manipulation and Hygiene: Supervision/safety       Functional mobility during ADLs: Supervision/safety;Contact guard assist General ADL Comments: Moving well for ADLs in room but with progressive distance, CGA needed with some fatigue and unsteadiness. Provided edema mgmt education for BLE (left handout in room)     Vision Ability to See in Adequate Light: 0 Adequate Patient Visual Report: No change from baseline Vision Assessment?: No apparent visual deficits     Perception         Praxis         Pertinent Vitals/Pain       Extremity/Trunk  Assessment Upper Extremity Assessment Upper Extremity Assessment: Generalized weakness;Right hand dominant   Lower Extremity Assessment Lower Extremity Assessment: Defer to PT evaluation   Cervical / Trunk Assessment Cervical / Trunk Assessment: Normal   Communication Communication Communication: No apparent difficulties Cueing  Techniques: Verbal cues   Cognition Arousal: Alert Behavior During Therapy: WFL for tasks assessed/performed Overall Cognitive Status: Within Functional Limits for tasks assessed                                       General Comments  Edema in the bil LE with L > R    Exercises     Shoulder Instructions      Home Living Family/patient expects to be discharged to:: Private residence Living Arrangements: Spouse/significant other Available Help at Discharge: Family;Available PRN/intermittently Type of Home: Apartment Home Access: Stairs to enter Entrance Stairs-Number of Steps: 10 Entrance Stairs-Rails: Right;Left Home Layout: One level     Bathroom Shower/Tub: Chief Strategy Officer: Standard Bathroom Accessibility: No   Home Equipment: None          Prior Functioning/Environment Prior Level of Function : Independent/Modified Independent;Working/employed;Driving             Mobility Comments: ambulates without AD ADLs 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        OT Problem List: Increased edema;Decreased activity tolerance;Impaired balance (sitting and/or standing);Decreased strength      OT Treatment/Interventions: Self-care/ADL training;Therapeutic exercise;Energy conservation;DME and/or AE instruction;Therapeutic activities;Balance training;Patient/family education    OT Goals(Current goals can be found in the care plan section) Acute Rehab OT Goals Patient Stated Goal: get back to work OT Goal Formulation: With patient Time For Goal Achievement: 07/14/23 Potential to Achieve Goals: Good ADL Goals Pt/caregiver will Perform Home Exercise Program: Increased strength;Both right and left upper extremity;With theraband;Independently;With written HEP provided Additional ADL Goal #1: Pt to verbalize at least 2 edema mgmt strategies to implement at home Additional ADL  Goal #2: Pt to demo ability to gather ADL/IADL items independently without LOB  OT Frequency: Min 1X/week    Co-evaluation              AM-PAC OT "6 Clicks" Daily Activity     Outcome Measure Help from another person eating meals?: None Help from another person taking care of personal grooming?: None Help from another person toileting, which includes using toliet, bedpan, or urinal?: A Little Help from another person bathing (including washing, rinsing, drying)?: A Little Help from another person to put on and taking off regular upper body clothing?: None Help from another person to put on and taking off regular lower body clothing?: A Little 6 Click Score: 21   End of Session    Activity Tolerance: Patient tolerated treatment well Patient left:  (with PT in hall)  OT Visit Diagnosis: Other abnormalities of gait and mobility (R26.89)                Time: 1140-1201 OT Time Calculation (min): 21 min Charges:  OT General Charges $OT Visit: 1 Visit OT Evaluation $OT Eval Low Complexity: 1 Low  Bradd Canary, OTR/L Acute Rehab Services Office: [phone]   Lorre Munroe 06/30/2023, 12:35 PM

## 2023-06-30 NOTE — H&P (Signed)
Date: 06/30/2023               Patient Name:  William Jones MRN: [id number]  DOB: April 23, 1951 Age / Sex: 72 y.o., male   PCP: Morene Crocker, MD         Medical Service: Internal Medicine Teaching Service         Attending Physician: Dr. Debe Coder, MD      First Contact: Dr. Morrie Sheldon, MD Pager [phone]    Second Contact: Dr. Rana Snare, DO Pager [phone]         After Hours (After 5p/  First Contact Pager: [phone]  weekends / holidays): Second Contact Pager: [phone]   SUBJECTIVE   Chief Complaint: Legs swelling and generalized body weakness   History of Present Illness: William Jones is a our pleasant 72 year old male with a hx of metastatic prostate cancer on Xtandi, HTN and Diabetes who presented to the ED via EMS due to concerns for  generalized weakness and Lower extremity swelling. Patient reports its all  started around labour day when he began to have general body weakness and bilateral leg swelling. He went to an urgent care on 9/16 and was given a short course of Laxis.  He reports that the weakness was somewhat bearable  until today when he got stuck on the toilet and couldn't get up after using it. He called her daughter who called the EMS to bring him here. Says the LE swelling preceded the generalized body weakness.  Of note, patient was recently discharged from the hospital on 06/12/2023 after being admitted for GI bleed  ( Hgb of 5.8 ) and found to have Peptic ulcer for which he was treated with Pantoprazole. He reports the PPI makes him dizzy when he takes it . His PCP also increased his amlodipine from 5 mg to 10 mg  on 9/20 and had an ECHO ordered which he [place] yet to get.   He however denies any associated chest pain, dyspnea or palpitations    ED Course: On presentation, his vitals were Blood pressure (!) 156/87, pulse 90, temperature (!) 97.4 F (36.3 C), temperature source Oral, resp. rate 19, height 5\' 8"  (1.727 m), weight 79.7 kg, SpO2  100%. Had a UA,Lasix and the IMTS team was consulted to admit   Past Medical History HTN Metastatic Prostate Cancer Diabetes  Meds:  No outpatient medications have been marked as taking for the 06/29/23 encounter [facility] Encounter).  Denosumab monthly  Amlodipine 10 mg Vitamin d Dorzolamide iron PPI every day Maurie Boettcher    Past Surgical History:  Procedure Laterality Date   BIOPSY  06/11/2023   Procedure: BIOPSY;  Surgeon: Benancio Deeds, MD;  Location: [facility] ENDOSCOPY;  Service: Gastroenterology;;   COLONOSCOPY N/A 01/10/2014   Procedure: COLONOSCOPY;  Surgeon: Theda Belfast, MD;  Location: [facility] ENDOSCOPY;  Service: Endoscopy;  Laterality: N/A;   ESOPHAGOGASTRODUODENOSCOPY N/A 01/10/2014   Procedure: ESOPHAGOGASTRODUODENOSCOPY (EGD);  Surgeon: Theda Belfast, MD;  Location: [facility] ENDOSCOPY;  Service: Endoscopy;  Laterality: N/A;   ESOPHAGOGASTRODUODENOSCOPY (EGD) WITH PROPOFOL N/A 06/11/2023   Procedure: ESOPHAGOGASTRODUODENOSCOPY (EGD) WITH PROPOFOL;  Surgeon: Benancio Deeds, MD;  Location: [facility] ENDOSCOPY;  Service: Gastroenterology;  Laterality: N/A;   FLEXIBLE SIGMOIDOSCOPY N/A 09/19/2015   Procedure: FLEXIBLE SIGMOIDOSCOPY;  Surgeon: Jeani Hawking, MD;  Location: [facility] ENDOSCOPY;  Service: Endoscopy;  Laterality: N/A;   FLEXIBLE SIGMOIDOSCOPY N/A 06/03/2017   Procedure: FLEXIBLE SIGMOIDOSCOPY;  Surgeon: Jeani Hawking, MD;  Location: [facility] ENDOSCOPY;  Service: Endoscopy;  Laterality: N/A;   FLEXIBLE SIGMOIDOSCOPY N/A 01/06/2018   Procedure: FLEXIBLE SIGMOIDOSCOPY;  Surgeon: Jeani Hawking, MD;  Location: [facility] ENDOSCOPY;  Service: Endoscopy;  Laterality: N/A;   GIVENS CAPSULE STUDY N/A 01/10/2014   Procedure: GIVENS CAPSULE STUDY;  Surgeon: Theda Belfast, MD;  Location: [facility] ENDOSCOPY;  Service: Endoscopy;  Laterality: N/A;   HOT HEMOSTASIS N/A 09/19/2015   Procedure: HOT HEMOSTASIS (ARGON PLASMA COAGULATION/BICAP);  Surgeon: Jeani Hawking, MD;  Location: [facility] ENDOSCOPY;  Service:  Endoscopy;  Laterality: N/A;   HOT HEMOSTASIS N/A 06/03/2017   Procedure: HOT HEMOSTASIS (ARGON PLASMA COAGULATION/BICAP);  Surgeon: Jeani Hawking, MD;  Location: [facility] ENDOSCOPY;  Service: Endoscopy;  Laterality: N/A;   PROSTATE BIOPSY  03/05/14   Gleason 7, vol 45 gm    Social:  Lives With: Girlfriend 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 No use of any other illicit drugs  Designated Decision MakerKatheran Awe (Daughter) [phone]  Code Status: FULL CODE    Family History:  Mother : Diseased ( Had chronic leg swelling ) Father : Unknown   Allergies: Allergies as of 06/29/2023   (No Known Allergies)    Review of Systems: A complete ROS was negative except as per HPI.   OBJECTIVE:   Physical Exam: Blood pressure (!) 156/87, pulse 90, temperature (!) 97.4 F (36.3 C), temperature source Oral, resp. rate 19, height 5\' 8"  (1.727 m), weight 79.7 kg, SpO2 100%.   Constitutional: well-appearing man, laying in bed in not acute distress, responding to questions appropriately  HENT: normocephalic atraumatic, mucous membranes moist Eyes: conjunctiva non-erythematous Cardiovascular: 2/6 systolic murmur appreciated  Pulmonary/Chest: Normal work of breathing. No crackles heard. Clear lungs bilaterally  MSK: 3+ Bilateral LE edema   Neurological: A&O x3. 4/5 strength in LE bilaterally. 4/5 strength in UE bilaterally.CN III-XII grossly intact. No focal weakness noted. Skin: Normal skin pigmentation. Healed frost bites marks on both feet  Psych: Normal mood and affect   RESULTS UA - Ketones, glucosuria Trop 24 BNP 156 Lactic Acid 1.3 CK 153  Labs: CBC    Component Value Date/Time   WBC 4.4 06/30/2023 0258   RBC 3.58 (L) 06/30/2023 0258   HGB 11.0 (L) 06/30/2023 0258   HGB 10.6 (L) 06/24/2023 1008   HCT 32.7 (L) 06/30/2023 0258    HCT 33.8 (L) 06/24/2023 1008   PLT 161 06/30/2023 0258   PLT 262 06/24/2023 1008   MCV 91.3 06/30/2023 0258   MCV 95 06/24/2023 1008   MCH 30.7 06/30/2023 0258   MCHC 33.6 06/30/2023 0258   RDW 15.5 06/30/2023 0258   RDW 14.3 06/24/2023 1008   LYMPHSABS 0.2 (L) 06/29/2023 2216   LYMPHSABS 1.3 04/10/2019 1006   MONOABS 0.6 06/29/2023 2216   EOSABS 0.0 06/29/2023 2216   EOSABS 0.2 04/10/2019 1006   BASOSABS 0.0 06/29/2023 2216   BASOSABS 0.0 04/10/2019 1006     CMP     Component Value Date/Time   NA 139 06/30/2023 0258   NA 133 (L) 06/24/2023 1008   K 2.8 (L) 06/30/2023 0258   CL 97 (L) 06/30/2023 0258   CO2 26 06/30/2023 0258   GLUCOSE 329 (H) 06/30/2023 0258   BUN 19 06/30/2023 0258   BUN 26 06/24/2023 1008   CREATININE 0.77 06/30/2023 0258   CALCIUM 7.2 (L) 06/30/2023 0258   PROT 5.9 (L) 06/30/2023 0258  PROT 6.3 04/10/2019 1006   ALBUMIN 2.9 (L) 06/30/2023 0258   ALBUMIN 3.9 04/10/2019 1006   AST 45 (H) 06/30/2023 0258   ALT 118 (H) 06/30/2023 0258   ALKPHOS 85 06/30/2023 0258   BILITOT 1.1 06/30/2023 0258   BILITOT <0.2 04/10/2019 1006   GFRNONAA >60 06/30/2023 0258   GFRAA 103 12/18/2019 0944    Imaging:  EKG: personally reviewed my interpretation is Sinus rhythm.No concerning changes  Prior EKG 06/10/23  ASSESSMENT & PLAN:   Assessment & Plan by Problem: Principal Problem:   Generalized weakness   TYVIN PHIBBS is a 72 y.o. person living with a history of metastatic prostate cancer ,HTN and diabetes and recent PUD with GI bleed who presented with generalized body weakness and bilateral LE extremity  and admitted  further work up.  #Generalized Weakness Patient reports generalized weakness for past 2 weeks, says he hasn't had this before. He couldn't get off the toilet today because he was really weak. He denies any changes in appetite. Subacute generalize weakness  could be due to metabolic and neurological disorders vs infections vs anemia vs  multifactorial. His hgb is 11 ( baseline of  8-10.6),less concerns for acute blood loss at this time. Pt denies any recent sick contacts.UA was negative for urinary tract infections. CK is unremarkable less concern for myositis. K at 3.4 from 4.4. TSH is normal at 1.25. - Resp Panel   #Bilateral Lower Extremities Swelling Pt had LE swelling for the past 2 weeks,was seen at the urgent care 10 days ago and given Laxis but didn't help.He presents with a 3+ pitting edema in both legs.No previous history of LE swelling. No hx of HF.I wonder if his  amlodipine could be contributing to his LE swelling vs new onset of HF.I will consider discontinuing  his amlodipine and starting an ARB for his BP.BNP was 156 when he came in. Pt however denies any associated shortness of breath,dyspnea or chest discomfort. Will get an ECHO to further evaluate if this a new onset HF.  - Laxis  - ECHO - Daily weights. - Initiate Losartan  25 mg  #Hypokalemia  K of 3.4 from 4.4 two weeks ago. New Labs now showed Mg of 1.9 and K of 2.8,could be from the Laxis.No EKG changes - Give Potassium 40 mEq x 2 - Give Mg 2g - Recheck BMP  #Elevated ALT He has an isolated increased ALT of 112 ,previously within normal limit. Denies abdominal pain and abd exam is unremarkable. - Observe  #Elevated troponin On presentation, his Trops were elevated at 24.Second trops at 25.Patient denies any chest pain. - Observe   #Diabetes #Glucosuria  His last A1c was 6.1 and on presentation his glucose was 311.Pt was recently taking off metformin and replaced with Jardiance which could be causing glucosuria. - Start SS Insulin  #Glaucoma On home Dorzolamide - Continue Dorzolamide  #Metastatic Prostate Cancer On home Daily Xtandi and Denosumab monthly - Continue same regimen   Diet: Normal VTE: Lovenox IVF: None Code: Full  Prior to Admission Living Arrangement: Home, living with Girlfriend Anticipated Discharge Location:  Home Barriers to Discharge: Medical work up   Dispo: Admit patient to Observation with expected length of stay less than 2 midnights.  Signed: Kathleen Lime, MD Internal Medicine Resident PGY-1  06/30/2023, 6:31 AM

## 2023-06-30 NOTE — Evaluation (Signed)
Physical Therapy Evaluation Patient Details Name: William Jones MRN: [id number] DOB: 22-Sep-1951 Today's Date: 06/30/2023  History of Present Illness  Pt is 72 yo arrived by EMS. Pt has noticed feet swelling and generalized weakness. Pt had high CBG and HTN on arrival. Recent admission on 9/8 for GI bleed. PMH: angiodysplasia of colon with hemorrhage, duodenitis, HTN, glaucoma, hemorrhage of rectum and anus, hx prostate cancer, hyperlipidemia, iron deficiency anemia, malignant tumor of prostate, peripheral neuropathy, rectal mass, thrombocytopenia, DM.  Clinical Impression  Pt is presenting below baseline level of functioning. Prior to hospitalization pt was Ind with all functional activities. Currently pt is CGA for gait and Min A for stair navigation due to weakness and impaired balance. Due to pt current functional status, home set up and available assistance at home recommending skilled physical therapy services 3x/weekly on discharge from acute care hospital setting in order to decrease risk for falls, injury and re-hospitalization. Pt tolerated treatment session well.         If plan is discharge home, recommend the following: Help with stairs or ramp for entrance     Equipment Recommendations None recommended by PT     Functional Status Assessment Patient has had a recent decline in their functional status and demonstrates the ability to make significant improvements in function in a reasonable and predictable amount of time.     Precautions / Restrictions Precautions Precautions: Fall Restrictions Weight Bearing Restrictions: No      Mobility  Bed Mobility     General bed mobility comments: Received pt in hall from occupational therapy    Transfers Overall transfer level: Modified independent Equipment used: None         General transfer comment: Pt able to stand from elevated surface without AD or LOB    Ambulation/Gait Ambulation/Gait assistance: Contact  guard assist Gait Distance (Feet): 150 Feet Assistive device: None Gait Pattern/deviations: Step-through pattern, Decreased step length - right, Decreased step length - left, Narrow base of support Gait velocity: decreased Gait velocity interpretation: 1.31 - 2.62 ft/sec, indicative of limited community ambulator   General Gait Details: ER bil feet with no arm swing. Asked pt how they normally walk at home and improved stride significantly with improved balance and arm swing. Pt was asked why he was not moving like this and pt states he feels "weird" from a pill he was given at the hospital. Pt encouraged to ambulate as he normally does. Pt had 1x LOB with CGA throughout gait due to impaired balance with very short steps.  Stairs Stairs: Yes Stairs assistance: Min assist Stair Management: Forwards, Step to pattern, Alternating pattern, One rail Right, One rail Left Number of Stairs: 10 General stair comments: pt performed steps per home set up with Min A intermittently for ascending and descending. Pt initially attempted to perform alternating stepping pattern; improved with step to. Pt has posterior lean with ascending steps.     Balance Overall balance assessment: Needs assistance Sitting-balance support: Single extremity supported Sitting balance-Leahy Scale: Good   Postural control: Posterior lean Standing balance support: Single extremity supported, Bilateral upper extremity supported, No upper extremity supported, During functional activity Standing balance-Leahy Scale: Fair Standing balance comment: 1x LOB with CGA from physical therapist and Min A to regain balance during gait with multiple obstacles.       Pertinent Vitals/Pain Pain Assessment Pain Assessment: No/denies pain    Home Living Family/patient expects to be discharged to:: Private residence Living Arrangements: Spouse/significant other Available Help at  Discharge: Family;Available PRN/intermittently Type of  Home: Apartment Home Access: Stairs to enter Entrance Stairs-Rails: Right;Left Entrance Stairs-Number of Steps: 10   Home Layout: One level Home Equipment: None      Prior Function Prior Level of Function : Independent/Modified Independent;Working/employed;Driving         Mobility Comments: ambulates without AD       Extremity/Trunk Assessment   Upper Extremity Assessment Upper Extremity Assessment: Defer to OT evaluation    Lower Extremity Assessment Lower Extremity Assessment: Generalized weakness    Cervical / Trunk Assessment Cervical / Trunk Assessment: Normal  Communication   Communication Communication: No apparent difficulties Cueing Techniques: Verbal cues  Cognition Arousal: Alert Behavior During Therapy: WFL for tasks assessed/performed Overall Cognitive Status: Within Functional Limits for tasks assessed          General Comments General comments (skin integrity, edema, etc.): Edema in the bil LE with L > R        Assessment/Plan    PT Assessment Patient needs continued PT services  PT Problem List Decreased strength;Decreased mobility;Decreased balance       PT Treatment Interventions DME instruction;Therapeutic exercise;Gait training;Balance training;Stair training;Functional mobility training;Therapeutic activities;Patient/family education;Neuromuscular re-education    PT Goals (Current goals can be found in the Care Plan section)  Acute Rehab PT Goals Patient Stated Goal: to return home and improve strength PT Goal Formulation: With patient Time For Goal Achievement: 07/14/23 Potential to Achieve Goals: Good    Frequency Min 1X/week        AM-PAC PT "6 Clicks" Mobility  Outcome Measure Help needed turning from your back to your side while in a flat bed without using bedrails?: None Help needed moving from lying on your back to sitting on the side of a flat bed without using bedrails?: None Help needed moving to and from a bed to  a chair (including a wheelchair)?: None Help needed standing up from a chair using your arms (e.g., wheelchair or bedside chair)?: None Help needed to walk in hospital room?: A Little Help needed climbing 3-5 steps with a railing? : A Little 6 Click Score: 22    End of Session Equipment Utilized During Treatment: Gait belt Activity Tolerance: Patient tolerated treatment well Patient left: in chair;with call bell/phone within reach Nurse Communication: Mobility status PT Visit Diagnosis: Other abnormalities of gait and mobility (R26.89);Unsteadiness on feet (R26.81);Muscle weakness (generalized) (M62.81)    Time: 5284-1324 PT Time Calculation (min) (ACUTE ONLY): 15 min   Charges:   PT Evaluation $PT Eval Low Complexity: 1 Low   PT General Charges $$ ACUTE PT VISIT: 1 Visit       Harrel Carina, DPT, CLT  Acute Rehabilitation Services Office: [phone] (Secure chat preferred)   Claudia Desanctis 06/30/2023, 12:30 PM

## 2023-06-30 NOTE — Plan of Care (Signed)

## 2023-06-30 NOTE — Progress Notes (Addendum)
HD#0 SUBJECTIVE:  Patient Summary: William Jones is a 72 y.o. with a pertinent PMH of metastatic prostate cancer, hypertension, AVM colon, PUD, and glaucoma who presented with generalized weakness and lower extremity edema and admitted for generalized weakness.   Overnight Events: No acute events overnight  Interim History: Patient was evaluated bedside.  Patient notes that he has continued improvement in his weakness.  He was able to rise from his chair without assistance upon examination.  Patient is able to ambulate independently.  Denies any chest pain, shortness of breath, vision changes, abdominal pain, or any other signs or symptoms.  OBJECTIVE:  Vital Signs: Vitals:   06/30/23 0342 06/30/23 0533 06/30/23 0737 06/30/23 0800  BP: (!) 196/102 127/81 (!) 171/98 (!) 160/117  Pulse: 87 85 84 88  Resp: 18 18 17 20   Temp: 98.1 F (36.7 C) 98.7 F (37.1 C) 97.8 F (36.6 C)   TempSrc: Oral  Oral   SpO2: 100% 100% 100% 100%  Weight:      Height:       Supplemental O2: Room Air SpO2: 100 %  Filed Weights   06/29/23 2159  Weight: 79.7 kg    Intake/Output Summary (Last 24 hours) at 06/30/2023 1129 Last data filed at 06/30/2023 0155 Gross per 24 hour  Intake 100 ml  Output 600 ml  Net -500 ml   Net IO Since Admission: -500 mL [06/30/23 1129]  CBC    Component Value Date/Time   WBC 4.4 06/30/2023 0258   RBC 3.58 (L) 06/30/2023 0258   HGB 11.0 (L) 06/30/2023 0258   HGB 10.6 (L) 06/24/2023 1008   HCT 32.7 (L) 06/30/2023 0258   HCT 33.8 (L) 06/24/2023 1008   PLT 161 06/30/2023 0258   PLT 262 06/24/2023 1008   MCV 91.3 06/30/2023 0258   MCV 95 06/24/2023 1008   MCH 30.7 06/30/2023 0258   MCHC 33.6 06/30/2023 0258   RDW 15.5 06/30/2023 0258   RDW 14.3 06/24/2023 1008   LYMPHSABS 0.2 (L) 06/29/2023 2216   LYMPHSABS 1.3 04/10/2019 1006   MONOABS 0.6 06/29/2023 2216   EOSABS 0.0 06/29/2023 2216   EOSABS 0.2 04/10/2019 1006   BASOSABS 0.0 06/29/2023 2216    BASOSABS 0.0 04/10/2019 1006   CMP     Component Value Date/Time   NA 139 06/30/2023 0258   NA 133 (L) 06/24/2023 1008   K 2.8 (L) 06/30/2023 0258   CL 97 (L) 06/30/2023 0258   CO2 26 06/30/2023 0258   GLUCOSE 329 (H) 06/30/2023 0258   BUN 19 06/30/2023 0258   BUN 26 06/24/2023 1008   CREATININE 0.77 06/30/2023 0258   CALCIUM 7.2 (L) 06/30/2023 0258   PROT 5.9 (L) 06/30/2023 0258   PROT 6.3 04/10/2019 1006   ALBUMIN 2.9 (L) 06/30/2023 0258   ALBUMIN 3.9 04/10/2019 1006   AST 45 (H) 06/30/2023 0258   ALT 118 (H) 06/30/2023 0258   ALKPHOS 85 06/30/2023 0258   BILITOT 1.1 06/30/2023 0258   BILITOT <0.2 04/10/2019 1006   EGFR 96 06/24/2023 1008   GFRNONAA >60 06/30/2023 0258    Physical Exam: Physical Exam Constitutional:      Appearance: Normal appearance.  HENT:     Head: Normocephalic and atraumatic.  Cardiovascular:     Rate and Rhythm: Normal rate and regular rhythm.  Pulmonary:     Effort: Pulmonary effort is normal.     Breath sounds: Normal breath sounds.  Abdominal:     General: Abdomen is flat.  Palpations: Abdomen is soft.  Musculoskeletal:     Right lower leg: 3+ Pitting Edema present.     Left lower leg: 3+ Pitting Edema present.  Neurological:     General: No focal deficit present.     Mental Status: He is alert and oriented to person, place, and time.     Comments: No fasciculations; 5/5 strength in upper and lower extremities  Psychiatric:        Mood and Affect: Mood normal.        Behavior: Behavior normal.     ASSESSMENT/PLAN:  Assessment: Principal Problem:   Generalized weakness   Plan: #Generalized weakness #Hypocalcemia  #Hypoalbuminemia  Generalized weakness noted throughout previous 2 weeks that had not been present before. Corrected calcium of 8.1 which was replenished and may have been contributing.  Patient noted to be on daily Xtandi for metastatic prostate cancer.  Xtandi held as this can exacerbate weakness.  Patient noted  to have had well balanced appetite at home with no decreased intake of fluids.  Patient also denies any recent nausea, vomiting, or diarrhea. He was also recently hospitalized for a gastric ulcer with hemorrhage.  On his last visit in the [facility], he was noted to have not been taking his PPI but resumed it after his visit on 06/24/2023.  Patient also denies any melena or other signs or symptoms of bleeding and his hemoglobin was stable at 11.0 so acute bleeding less likely cause of generalized weakness. TSH WNL. UA negative. Etiology remains unknown but likely multifactorial.  - PT/OT - Follow-up CMP and CBC - Hold Xtandi  #Bilateral lower extremity swelling Unknown etiology but likely multifactorial.  Patient has a history of hypoalbuminemia first noted in 10/2007.  This may be leading to decreased oncotic pressure and subsequent fluid shift leading to bilateral lower extremity swelling.  We will begin supplementing his diet with Ensure 3 times daily between meals.  Patient also denies any history of heart failure but BNP elevated at 156.4 so we will follow-up with an echo.  Of note, patient was on amlodipine so this was held and replaced with losartan.  Patient was given one-time dose of 40 mg IV Lasix in the ED.  He had good urine output with net out put of -500 mL.  We will trial another dose of Lasix and continue to monitor his weights, fluid status, and urine output. - 40 mg IV Lasix - Ensure 3 times daily between meals - Follow-up echo - Strict I's/O's and daily weights  #Hypertension Prior to admission, patient was taking amlodipine 10 mg.  This was held as this may be contributing to the bilateral lower extremity swelling.  Patient was initially started on losartan 25 mg but remained hypertensive with systolic blood pressure in the 160s.  Dose was increased to 50 mg once per day.  Patient denied any chest pain, vision changes, shortness of breath, or any other signs or symptoms. - Hold amlodipine -  Continue losartan 50 mg once per day  #Hypokalemia Potassium was noted to be 2.8 so this was replenished.  No changes on EKG and patient denies any chest pain, palpitations, or any other signs or symptoms. - Follow-up CMP  #Abnormal LFTs AST mildly elevated at 45 and ALT elevated at 118.  2 weeks ago, both of these values were within normal limits.  Patient denies any abdominal pain, diarrhea, or any other signs or symptoms. - Follow-up right upper quadrant ultrasound - Trend LFTs  #T2DM Last A1c  was 6.5 on 06/24/2023.  He had not been taking his prescribed metformin 500 mg twice daily as it been causing headaches.  He was started on Jardiance 10 mg. - Continue SSI and monitor CBG  #Elevated troponins Initial troponin of 24 with subsequent troponin of 25.  Denied any chest pain and will continue to monitor.   Chronic History #Glucoma Continue home dorzolamide and timolol.  #Metastatic prostate cancer Spoke with Dr. Berneice Heinrich who has been prescribing his Xtandi.  Per Dr. Berneice Heinrich, we will hold his [name] while hospitalized as this can contribute to weakness.  #Hx of gastric ulcer with hemorrhage Patient had previous admission for bleeding gastric ulcer and was discharged on Protonix 40 mg twice daily.  Patient noted that this had been causing him hallucinations confusion so he had not been taking this at all.  On his last visit with the [facility], he noted that he will begin taking the medication again. Patient denied any melena or other signs or symptoms of bleeding.  - Continue Protonix 40 mg once per day  Best Practice: Diet: Regular diet VTE: enoxaparin (LOVENOX) injection 40 mg Start: 06/30/23 1400 Code: Full AB: None Therapy Recs: Pending DISPO: Anticipated discharge  TBD  to Home pending  resolution of weakness .  Signature: Morrie Sheldon, MD Internal Medicine Resident, PGY-1 [facility]  Pager: [phone]  Please contact the on call pager after 5 pm  and on weekends at [phone].

## 2023-06-30 NOTE — Progress Notes (Signed)
Patient is made aware to have  cancer medication sent from home so the pharmacy can label for use here in the hospital. He agree to have his daughter to bring it in today.

## 2023-06-30 NOTE — ED Notes (Signed)
ED TO INPATIENT HANDOFF REPORT  ED Nurse Name and Phone #: Melinda/Nathan Moctezuma [phone]  S Name/Age/Gender William Jones 72 y.o. male Room/Bed: 044C/044C  Code Status   Code Status: Full Code  Home/SNF/Other Home Patient oriented to: self, place, time, and situation Is this baseline? Yes   Triage Complete: Triage complete  Chief Complaint Generalized weakness [R53.1]  Triage Note PT bib by [facility] from home. For the past 5 days PT has noticed feet swelling and generalized weakness all over his body.PT has no pain, SOB, n/v/d just stiffness in the lower body . CBG:445 but PT did not take jardiance this am because it makes him feel sluggish and he can't move around. PT also has HTN and does not take his BP medication because it makes him feel funny.150L of normal saline administered by EMS.   Allergies No Known Allergies  Level of Care/Admitting Diagnosis ED Disposition     ED Disposition  Admit   Condition  --   Comment  Hospital Area: [facility] [[id number]]  Level of Care: Telemetry Medical [104]  May place patient in observation at [facility] or [facility] if equivalent level of care is available:: No  Covid Evaluation: Symptomatic Person Under Investigation (PUI) or recent exposure (last 10 days) *Testing Required*  Diagnosis: Generalized weakness [130865]  Admitting Physician: Inez Catalina [[id number]  Attending Physician: Nena Polio          B Medical/Surgery History Past Medical History:  Diagnosis Date   Angiodysplasia of colon with hemorrhage 10/14/2020   Duodenitis    Essential hypertension 07/16/2015   Fatigue 11/06/2020   Glaucoma    H/O: substance abuse (HCC)    Hemorrhage of rectum and anus 10/14/2020   History of frostbite    History of prostate cancer 03/20/2014   Lost to f/u with [facility] in the past 2011 with elevated PSA >30 at that time.  Saw [facility] (Dr. Isabel Caprice) on 03/13/14. Cancer Noted 03/13/14  office visit. Gleason score 7. Plan CT Ab/pelvis with contrast, bone scan    History of radiation therapy nov 2015 to feb 2016   Hyperlipidemia 11/20/2019   Iron deficiency anemia    Malignant tumor of prostate (HCC) 10/14/2020   Peripheral neuropathy    Prostate cancer (HCC) 03/05/14   Gleason 7, volume 45 gm   Rash and nonspecific skin eruption 08/04/2017   Rectal mass 09/15/2016   S/P radiation therapy 09/18/14-11/15/14   prostate/ 7800Gy/40sessions   Thrombocytopenia [facility])    Past Surgical History:  Procedure Laterality Date   BIOPSY  06/11/2023   Procedure: BIOPSY;  Surgeon: Benancio Deeds, MD;  Location: [facility] ENDOSCOPY;  Service: Gastroenterology;;   COLONOSCOPY N/A 01/10/2014   Procedure: COLONOSCOPY;  Surgeon: Theda Belfast, MD;  Location: [facility] ENDOSCOPY;  Service: Endoscopy;  Laterality: N/A;   ESOPHAGOGASTRODUODENOSCOPY N/A 01/10/2014   Procedure: ESOPHAGOGASTRODUODENOSCOPY (EGD);  Surgeon: Theda Belfast, MD;  Location: [facility] ENDOSCOPY;  Service: Endoscopy;  Laterality: N/A;   ESOPHAGOGASTRODUODENOSCOPY (EGD) WITH PROPOFOL N/A 06/11/2023   Procedure: ESOPHAGOGASTRODUODENOSCOPY (EGD) WITH PROPOFOL;  Surgeon: Benancio Deeds, MD;  Location: [facility] ENDOSCOPY;  Service: Gastroenterology;  Laterality: N/A;   FLEXIBLE SIGMOIDOSCOPY N/A 09/19/2015   Procedure: FLEXIBLE SIGMOIDOSCOPY;  Surgeon: Jeani Hawking, MD;  Location: [facility] ENDOSCOPY;  Service: Endoscopy;  Laterality: N/A;   FLEXIBLE SIGMOIDOSCOPY N/A 06/03/2017   Procedure: FLEXIBLE SIGMOIDOSCOPY;  Surgeon: Jeani Hawking, MD;  Location: [facility] ENDOSCOPY;  Service: Endoscopy;  Laterality: N/A;   FLEXIBLE SIGMOIDOSCOPY  N/A 01/06/2018   Procedure: FLEXIBLE SIGMOIDOSCOPY;  Surgeon: Jeani Hawking, MD;  Location: [facility] ENDOSCOPY;  Service: Endoscopy;  Laterality: N/A;   GIVENS CAPSULE STUDY N/A 01/10/2014   Procedure: GIVENS CAPSULE STUDY;  Surgeon: Theda Belfast, MD;  Location: [facility] ENDOSCOPY;  Service: Endoscopy;  Laterality: N/A;   HOT HEMOSTASIS N/A  09/19/2015   Procedure: HOT HEMOSTASIS (ARGON PLASMA COAGULATION/BICAP);  Surgeon: Jeani Hawking, MD;  Location: [facility] ENDOSCOPY;  Service: Endoscopy;  Laterality: N/A;   HOT HEMOSTASIS N/A 06/03/2017   Procedure: HOT HEMOSTASIS (ARGON PLASMA COAGULATION/BICAP);  Surgeon: Jeani Hawking, MD;  Location: [facility] ENDOSCOPY;  Service: Endoscopy;  Laterality: N/A;   PROSTATE BIOPSY  03/05/14   Gleason 7, vol 45 gm     A IV Location/Drains/Wounds Patient Lines/Drains/Airways Status     Active Line/Drains/Airways     Name Placement date Placement time Site Days   Peripheral IV 06/30/23 20 G Left Antecubital 06/30/23  0105  Antecubital  less than 1            Intake/Output Last 24 hours  Intake/Output Summary (Last 24 hours) at 06/30/2023 0233 Last data filed at 06/30/2023 0155 Gross per 24 hour  Intake 100 ml  Output 600 ml  Net -500 ml    Labs/Imaging Results for orders placed or performed during the hospital encounter of 06/29/23 (from the past 48 hour(s))  CBC with Differential     Status: Abnormal   Collection Time: 06/29/23 10:16 PM  Result Value Ref Range   WBC 4.6 4.0 - 10.5 K/uL   RBC 3.38 (L) 4.22 - 5.81 MIL/uL   Hemoglobin 10.2 (L) 13.0 - 17.0 g/dL   HCT 08.6 (L) 57.8 - 46.9 %   MCV 90.8 80.0 - 100.0 fL   MCH 30.2 26.0 - 34.0 pg   MCHC 33.2 30.0 - 36.0 g/dL   RDW 62.9 52.8 - 41.3 %   Platelets 163 150 - 400 K/uL   nRBC 0.0 0.0 - 0.2 %   Neutrophils Relative % 83 %   Neutro Abs 3.8 1.7 - 7.7 K/uL   Lymphocytes Relative 4 %   Lymphs Abs 0.2 (L) 0.7 - 4.0 K/uL   Monocytes Relative 13 %   Monocytes Absolute 0.6 0.1 - 1.0 K/uL   Eosinophils Relative 0 %   Eosinophils Absolute 0.0 0.0 - 0.5 K/uL   Basophils Relative 0 %   Basophils Absolute 0.0 0.0 - 0.1 K/uL   Immature Granulocytes 0 %   Abs Immature Granulocytes 0.02 0.00 - 0.07 K/uL    Comment: Performed at [facility], [address], [place], [place] [address]  Comprehensive metabolic panel     Status: Abnormal    Collection Time: 06/29/23 10:16 PM  Result Value Ref Range   Sodium 136 135 - 145 mmol/L   Potassium 3.0 (L) 3.5 - 5.1 mmol/L   Chloride 98 98 - 111 mmol/L   CO2 21 (L) 22 - 32 mmol/L   Glucose, Bld 383 (H) 70 - 99 mg/dL    Comment: Glucose reference range applies only to samples taken after fasting for at least 8 hours.   BUN 25 (H) 8 - 23 mg/dL   Creatinine, Ser 0.27 0.61 - 1.24 mg/dL   Calcium 7.3 (L) 8.9 - 10.3 mg/dL   Total Protein 5.4 (L) 6.5 - 8.1 g/dL   Albumin 2.7 (L) 3.5 - 5.0 g/dL   AST 40 15 - 41 U/L   ALT 112 (H) 0 - 44 U/L  Alkaline Phosphatase 79 38 - 126 U/L   Total Bilirubin 1.0 0.3 - 1.2 mg/dL   GFR, Estimated >16 >10 mL/min    Comment: (NOTE) Calculated using the CKD-EPI Creatinine Equation (2021)    Anion gap 17 (H) 5 - 15    Comment: Performed at [facility], [address] [place], [place] [address]  Brain natriuretic peptide     Status: Abnormal   Collection Time: 06/29/23 10:16 PM  Result Value Ref Range   B Natriuretic Peptide 156.4 (H) 0.0 - 100.0 pg/mL    Comment: Performed at [facility], [address], [place], [place] [address]  Troponin I (High Sensitivity)     Status: Abnormal   Collection Time: 06/29/23 10:16 PM  Result Value Ref Range   Troponin I (High Sensitivity) 24 (H) <18 ng/L    Comment: (NOTE) Elevated high sensitivity troponin I (hsTnI) values and significant  changes across serial measurements may suggest ACS but many other  chronic and acute conditions are known to elevate hsTnI results.  Refer to the "Links" section for chest pain algorithms and additional  guidance. Performed at [facility], [address] [place], [place] [address]   CK     Status: None   Collection Time: 06/29/23 10:16 PM  Result Value Ref Range   Total CK 153 49 - 397 U/L    Comment: Performed at [facility], [address], [place], [place] [address]  Urinalysis, w/ Reflex to Culture (Infection Suspected) -Urine, Clean  Catch     Status: Abnormal   Collection Time: 06/29/23 11:04 PM  Result Value Ref Range   Specimen Source URINE, CLEAN CATCH    Color, Urine YELLOW YELLOW   APPearance CLEAR CLEAR   Specific Gravity, Urine 1.023 1.005 - 1.030   pH 6.0 5.0 - 8.0   Glucose, UA >=500 (A) NEGATIVE mg/dL   Hgb urine dipstick NEGATIVE NEGATIVE   Bilirubin Urine NEGATIVE NEGATIVE   Ketones, ur 20 (A) NEGATIVE mg/dL   Protein, ur NEGATIVE NEGATIVE mg/dL   Nitrite NEGATIVE NEGATIVE   Leukocytes,Ua NEGATIVE NEGATIVE   RBC / HPF 0-5 0 - 5 RBC/hpf   WBC, UA 0-5 0 - 5 WBC/hpf    Comment:        Reflex urine culture not performed if WBC <=10, OR if Squamous epithelial cells >5. If Squamous epithelial cells >5 suggest recollection.    Bacteria, UA MANY (A) NONE SEEN   Squamous Epithelial / HPF 0-5 0 - 5 /HPF   Mucus PRESENT     Comment: Performed at [facility], [address] [place], [place] [address]  I-Stat venous blood gas, [facility] ED, [facility], [facility])     Status: Abnormal   Collection Time: 06/29/23 11:13 PM  Result Value Ref Range   pH, Ven 7.485 (H) 7.25 - 7.43   pCO2, Ven 32.2 (L) 44 - 60 mmHg   pO2, Ven 108 (H) 32 - 45 mmHg   Bicarbonate 24.3 20.0 - 28.0 mmol/L   TCO2 25 22 - 32 mmol/L   O2 Saturation 99 %   Acid-Base Excess 1.0 0.0 - 2.0 mmol/L   Sodium 136 135 - 145 mmol/L   Potassium 3.1 (L) 3.5 - 5.1 mmol/L   Calcium, Ion 0.96 (L) 1.15 - 1.40 mmol/L   HCT 30.0 (L) 39.0 - 52.0 %   Hemoglobin 10.2 (L) 13.0 - 17.0 g/dL   Sample type VENOUS   Lactic acid,  plasma     Status: None   Collection Time: 06/30/23 12:58 AM  Result Value Ref Range   Lactic Acid, Venous 1.3 0.5 - 1.9 mmol/L    Comment: Performed at [facility], [address], [place], [place] [address]   DG Chest Port 1 View  Result Date: 06/29/2023 CLINICAL DATA:  Weakness EXAM: PORTABLE CHEST 1 VIEW COMPARISON:  None Available. FINDINGS: The heart size and mediastinal contours are within normal limits. Both lungs are clear.  The visualized skeletal structures are unremarkable. IMPRESSION: No active disease. Electronically Signed   By: Darliss Cheney M.D.   On: 06/29/2023 23:28    Pending Labs Unresulted Labs (From admission, onward)     Start     Ordered   06/30/23 0500  Comprehensive metabolic panel  Tomorrow morning,   R        06/30/23 0100   06/30/23 0500  CBC  Tomorrow morning,   R        06/30/23 0100   06/30/23 0500  TSH  Tomorrow morning,   R        06/30/23 0143   06/30/23 0130  Resp Panel by RT-PCR (Flu A&B, Covid)  Once,   R        06/30/23 0130   06/30/23 0126  Magnesium  Add-on,   AD        06/30/23 0125            Vitals/Pain Today's Vitals   06/29/23 2159 06/29/23 2205 06/29/23 2307 06/29/23 2308  BP: (!) 165/100  (!) 156/87   Pulse: 95  90   Resp: (!) 23  19   Temp: (!) 97.5 F (36.4 C)  (!) 97.4 F (36.3 C)   TempSrc: Oral  Oral   SpO2: 100%  100%   Weight: 175 lb 11.3 oz (79.7 kg)     Height: 5\' 8"  (1.727 m)     PainSc: 0-No pain 0-No pain 0-No pain 0-No pain    Isolation Precautions No active isolations  Medications Medications  enoxaparin (LOVENOX) injection 40 mg (has no administration in time range)  ferrous sulfate tablet 325 mg (has no administration in time range)  enzalutamide (XTANDI) capsule 160 mg (has no administration in time range)  insulin aspart (novoLOG) injection 0-15 Units (has no administration in time range)  dorzolamide (TRUSOPT) 2 % ophthalmic solution 1 drop (has no administration in time range)  pantoprazole (PROTONIX) EC tablet 40 mg (has no administration in time range)  losartan (COZAAR) tablet 25 mg (has no administration in time range)  furosemide (LASIX) injection 40 mg (40 mg Intravenous Given 06/30/23 0105)  potassium chloride SA (KLOR-CON M) CR tablet 40 mEq (40 mEq Oral Given 06/30/23 0058)  cefTRIAXone (ROCEPHIN) 1 g in sodium chloride 0.9 % 100 mL IVPB (0 g Intravenous Stopped 06/30/23 0151)    Mobility walks with person assist      Focused Assessments Cardiac Assessment Handoff:  Cardiac Rhythm: Normal sinus rhythm Lab Results  Component Value Date   CKTOTAL 153 06/29/2023   CKMB 4.5 (H) 10/30/2007   TROPONINI <0.30 01/09/2014   No results found for: "DDIMER" Does the Patient currently have chest pain? No    R Recommendations: See Admitting Provider Note  Report given to:   Additional Notes: .

## 2023-06-30 NOTE — Hospital Course (Addendum)
#Innumerable hypoenhancing liver masses, bilobar hepatic metastatic disease vs. primary hepatic malignancy  #Bilateral adrenal metastases  #Abnormal LFTs Prior to admission, patient endorsed two week history of weakness.  Xtandi for metastatic prostate cancer was held.  Noted to have had a well-balanced diet at home with normal intake of fluids.  Denied any nausea, vomiting, diarrhea, melena, or other signs or symptoms of bleeding.  Hemoglobin was stable throughout stay.  TSH within normal limits and UA was negative. Right upper quadrant demonstrated numerous echogenic nodules throughout the liver that was suspicious for diffuse hepatic metastatic disease. This was further worked up with a CT of the chest, abdomen, and pelvis with contrast to further characterize these findings and assess for potential primary. The CT chest, abdomen, and pelvis demonstrated innumerable hypoenhancing masses throughout the liver concerning for bilobar metastatic disease versus primary hepatic malignancy such as hepatocellular carcinoma. The CT also found bilateral adrenal metastases.  LFTs also remained elevated likely secondary to the mets. AFP was normal at 6.5. An MRI of the brain with and without contrast was ordered to evaluate for brain metastases in the context of increased confusion at his previous clinic visit, but this demonstrated no acute findings. As the patient has been noted to have poor follow-up, we were concerned he was at risk for re-admission, so we consulted IR for biopsy of the liver and reached out to oncology for follow-up. Patient also received NM bone scan to follow-up outpatient. Stable at discharge.   #Generalized weakness #Hypocalcemia, resolved  #Hypoalbuminemia  Generalized weakness noted throughout previous 2 weeks that had not been present before. Initial corrected calcium of 8.1 that was replenished and may have been contributing.  Patient noted to be on daily Xtandi for metastatic prostate  cancer.  Xtandi held as this can exacerbate weakness per urologist.  Patient noted to have had well balanced appetite at home with no decreased intake of fluids.  Patient also denied any recent nausea, vomiting, or diarrhea. He was also recently hospitalized for a gastric ulcer with hemorrhage.  On his last visit in the [facility], he was noted to have not been taking his PPI but resumed it after his visit on 06/24/2023.  Patient also denied any melena or other signs or symptoms of bleeding and his hemoglobin was stable at 11.0 so acute bleeding less likely cause of generalized weakness. TSH WNL. UA negative. Etiology remained unknown but likely multifactorial. Continued to work with PT/OT. Continued to hold Xtandi for his metastatic prostate cancer which could lead to weakness. Instructed patient to follow-up with urology after discharge.   #Bilateral lower extremity edema Unknown etiology but likely multifactorial.  Patient had a history of hypoalbuminemia first noted in 10/2007.  We began supplementing his diet with Ensure 3 times daily between meals.  Patient denied any history of heart failure but BNP elevated at 156.4 so we followed up with an echo. Echo largely unremarkable. Of note, patient was on amlodipine so this was held and replaced with losartan.  Patient was given one-time dose of 40 mg IV Lasix in the ED.  He had good urine output with net output of -500 mL.  We gave another dose of Lasix and continued to monitor his weights, fluid status, and urine output. At discharge, lower extremity edema largely improved.   #Hypertension Patient initially on losartan 25 mg/day but systolic blood pressure was noted to be in the 160s.  His losartan was increased to 50 mg/day, and his pressure moderately improved with systolic in  the 140s.  Resumed home Jardiance after persistently elevated blood pressure.  Amlodipine was stopped prior to admission due to LE edema.  Patient noted to have had taken 25 mg  hydrochlorothiazide daily in the past but self-discontinued. We began 12.5 mg daily hydrochlorothiazide and continued to monitor pressures. Stable at discharge and will follow-up outpatient  #Hypokalemia Noted to be hypokalemic throughout the day so this was replenished.  Follow-up CMP outpatient.  #T2DM Prior to visit, patient was not taking any insulin.  His glucose was persistently in the 300s to 400s so he was started on basal insulin with glargine 10 units daily. CBG improving some with addition of semglee 10 units.  Restart home Jardiance. Glucose stable at discharge. Follow-up outpatient.   #Elevated troponins Initial troponin of 24 with subsequent troponin of 25.  Denied any chest pain and will continue to monitor. No concerns at discharge.   #Glucoma Continued home dorzolamide and timolol.   #Metastatic prostate cancer Spoke with urologist Dr. Berneice Heinrich who has been prescribing his Xtandi.  Per Dr. Berneice Heinrich, we held his [place] while hospitalized as this can contribute to weakness. Patient was informed to follow-up outpatient. PSA less than 0.01. Stable at discharge.    #Hx of gastric ulcer with hemorrhage Patient had previous admission for bleeding gastric ulcer and was discharged on Protonix 40 mg twice daily.  Patient noted that this had been causing him hallucinations confusion so he had not been taking this at all.  On his last visit with the [facility], he noted that he began taking the medication again. Patient denied any melena or other signs or symptoms of bleeding.

## 2023-06-30 NOTE — Plan of Care (Signed)
Problem: Education: Goal: Ability to describe self-care measures that may prevent or decrease complications (Diabetes Survival Skills Education) will improve 06/30/2023 0610 by Tennis Ship, RN Outcome: Progressing 06/30/2023 0550 by Tennis Ship, RN Outcome: Progressing Goal: Individualized Educational Video(s) 06/30/2023 0610 by Tennis Ship, RN Outcome: Progressing 06/30/2023 0550 by Tennis Ship, RN Outcome: Progressing   Problem: Coping: Goal: Ability to adjust to condition or change in health will improve 06/30/2023 0610 by Tennis Ship, RN Outcome: Progressing 06/30/2023 0550 by Tennis Ship, RN Outcome: Progressing   Problem: Fluid Volume: Goal: Ability to maintain a balanced intake and output will improve 06/30/2023 0610 by Tennis Ship, RN Outcome: Progressing 06/30/2023 0550 by Tennis Ship, RN Outcome: Progressing   Problem: Health Behavior/Discharge Planning: Goal: Ability to identify and utilize available resources and services will improve 06/30/2023 0610 by Tennis Ship, RN Outcome: Progressing 06/30/2023 0550 by Tennis Ship, RN Outcome: Progressing Goal: Ability to manage health-related needs will improve 06/30/2023 0610 by Tennis Ship, RN Outcome: Progressing 06/30/2023 0550 by Tennis Ship, RN Outcome: Progressing   Problem: Metabolic: Goal: Ability to maintain appropriate glucose levels will improve 06/30/2023 0610 by Tennis Ship, RN Outcome: Progressing 06/30/2023 0550 by Tennis Ship, RN Outcome: Progressing   Problem: Nutritional: Goal: Maintenance of adequate nutrition will improve 06/30/2023 0610 by Tennis Ship, RN Outcome: Progressing 06/30/2023 0550 by Tennis Ship, RN Outcome: Progressing Goal: Progress toward achieving an optimal weight will improve 06/30/2023 0610 by Tennis Ship, RN Outcome: Progressing 06/30/2023 0550 by Tennis Ship,  RN Outcome: Progressing   Problem: Skin Integrity: Goal: Risk for impaired skin integrity will decrease 06/30/2023 0610 by Tennis Ship, RN Outcome: Progressing 06/30/2023 0550 by Tennis Ship, RN Outcome: Progressing   Problem: Tissue Perfusion: Goal: Adequacy of tissue perfusion will improve 06/30/2023 0610 by Tennis Ship, RN Outcome: Progressing 06/30/2023 0550 by Tennis Ship, RN Outcome: Progressing   Problem: Education: Goal: Knowledge of General Education information will improve Description: Including pain rating scale, medication(s)/side effects and non-pharmacologic comfort measures 06/30/2023 0610 by Tennis Ship, RN Outcome: Progressing 06/30/2023 0550 by Tennis Ship, RN Outcome: Progressing   Problem: Health Behavior/Discharge Planning: Goal: Ability to manage health-related needs will improve 06/30/2023 0610 by Tennis Ship, RN Outcome: Progressing 06/30/2023 0550 by Tennis Ship, RN Outcome: Progressing   Problem: Clinical Measurements: Goal: Ability to maintain clinical measurements within normal limits will improve 06/30/2023 0610 by Tennis Ship, RN Outcome: Progressing 06/30/2023 0550 by Tennis Ship, RN Outcome: Progressing Goal: Will remain free from infection 06/30/2023 0610 by Tennis Ship, RN Outcome: Progressing 06/30/2023 0550 by Tennis Ship, RN Outcome: Progressing Goal: Diagnostic test results will improve 06/30/2023 0610 by Tennis Ship, RN Outcome: Progressing 06/30/2023 0550 by Tennis Ship, RN Outcome: Progressing Goal: Respiratory complications will improve 06/30/2023 0610 by Tennis Ship, RN Outcome: Progressing 06/30/2023 0550 by Tennis Ship, RN Outcome: Progressing Goal: Cardiovascular complication will be avoided 06/30/2023 0610 by Tennis Ship, RN Outcome: Progressing 06/30/2023 0550 by Tennis Ship, RN Outcome: Progressing    Problem: Activity: Goal: Risk for activity intolerance will decrease 06/30/2023 0610 by Tennis Ship, RN Outcome: Progressing 06/30/2023 0550 by Tennis Ship, RN Outcome: Progressing   Problem: Nutrition: Goal: Adequate nutrition will be maintained 06/30/2023 0610 by Tennis Ship, RN Outcome: Progressing 06/30/2023 0550 by Tennis Ship, RN Outcome: Progressing  Problem: Coping: Goal: Level of anxiety will decrease 06/30/2023 0610 by Tennis Ship, RN Outcome: Progressing 06/30/2023 0550 by Tennis Ship, RN Outcome: Progressing   Problem: Elimination: Goal: Will not experience complications related to bowel motility 06/30/2023 0610 by Tennis Ship, RN Outcome: Progressing 06/30/2023 0550 by Tennis Ship, RN Outcome: Progressing Goal: Will not experience complications related to urinary retention 06/30/2023 0610 by Tennis Ship, RN Outcome: Progressing 06/30/2023 0550 by Tennis Ship, RN Outcome: Progressing   Problem: Pain Managment: Goal: General experience of comfort will improve 06/30/2023 0610 by Tennis Ship, RN Outcome: Progressing 06/30/2023 0550 by Tennis Ship, RN Outcome: Progressing   Problem: Safety: Goal: Ability to remain free from injury will improve 06/30/2023 0610 by Tennis Ship, RN Outcome: Progressing 06/30/2023 0550 by Tennis Ship, RN Outcome: Progressing   Problem: Skin Integrity: Goal: Risk for impaired skin integrity will decrease 06/30/2023 0610 by Tennis Ship, RN Outcome: Progressing 06/30/2023 0550 by Tennis Ship, RN Outcome: Progressing

## 2023-06-30 NOTE — ED Notes (Signed)
Assumed care of patient, MD provider at bedside at present.

## 2023-07-01 DIAGNOSIS — R531 Weakness: Secondary | ICD-10-CM | POA: Diagnosis not present

## 2023-07-01 LAB — COMPREHENSIVE METABOLIC PANEL
ALT: 111 U/L — ABNORMAL HIGH (ref 0–44)
AST: 44 U/L — ABNORMAL HIGH (ref 15–41)
Albumin: 2.9 g/dL — ABNORMAL LOW (ref 3.5–5.0)
Alkaline Phosphatase: 83 U/L (ref 38–126)
Anion gap: 10 (ref 5–15)
BUN: 24 mg/dL — ABNORMAL HIGH (ref 8–23)
CO2: 28 mmol/L (ref 22–32)
Calcium: 8.2 mg/dL — ABNORMAL LOW (ref 8.9–10.3)
Chloride: 102 mmol/L (ref 98–111)
Creatinine, Ser: 0.74 mg/dL (ref 0.61–1.24)
GFR, Estimated: >60 mL/min (ref >60)
Glucose, Bld: 380 mg/dL — ABNORMAL HIGH (ref 70–99)
Potassium: 3.3 mmol/L — ABNORMAL LOW (ref 3.5–5.1)
Sodium: 140 mmol/L (ref 135–145)
Total Bilirubin: 0.7 mg/dL (ref 0.3–1.2)
Total Protein: 5.9 g/dL — ABNORMAL LOW (ref 6.5–8.1)

## 2023-07-01 LAB — CBC
HCT: 32.8 % — ABNORMAL LOW (ref 39.0–52.0)
Hemoglobin: 10.9 g/dL — ABNORMAL LOW (ref 13.0–17.0)
MCH: 30.6 pg (ref 26.0–34.0)
MCHC: 33.2 g/dL (ref 30.0–36.0)
MCV: 92.1 fL (ref 80.0–100.0)
Platelets: 171 10*3/uL (ref 150–400)
RBC: 3.56 MIL/uL — ABNORMAL LOW (ref 4.22–5.81)
RDW: 15.6 % — ABNORMAL HIGH (ref 11.5–15.5)
WBC: 5 10*3/uL (ref 4.0–10.5)
nRBC: 0 % (ref 0.0–0.2)

## 2023-07-01 LAB — GLUCOSE, CAPILLARY
Glucose-Capillary: 309 mg/dL — ABNORMAL HIGH (ref 70–99)
Glucose-Capillary: 352 mg/dL — ABNORMAL HIGH (ref 70–99)
Glucose-Capillary: 343 mg/dL — ABNORMAL HIGH (ref 70–99)
Glucose-Capillary: 206 mg/dL — ABNORMAL HIGH (ref 70–99)

## 2023-07-01 MED ORDER — POTASSIUM CHLORIDE CRYS ER 20 MEQ PO TBCR
40.0000 meq | EXTENDED_RELEASE_TABLET | Freq: Two times a day (BID) | ORAL | Status: AC
  Administered 2023-07-01 (×2): 40 meq via ORAL
  Filled 2023-07-01 (×2): qty 2

## 2023-07-01 MED ORDER — INSULIN GLARGINE-YFGN 100 UNIT/ML ~~LOC~~ SOLN
10.0000 [IU] | Freq: Every day | SUBCUTANEOUS | Status: DC
  Administered 2023-07-01 – 2023-07-05 (×5): 10 [IU] via SUBCUTANEOUS
  Filled 2023-07-01 (×5): qty 0.1

## 2023-07-01 NOTE — Progress Notes (Signed)
HD#1 SUBJECTIVE:  Patient Summary: William Jones is a 72 y.o. 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   Overnight Events: No acute events overnight  Interim History: Patient was evaluated bedside.  Patient endorsed slightly increased weakness compared to yesterday.  He continues to deny any abdominal pain, chest pain, headaches, nausea, vomiting, or any other signs or symptoms.  He feels like the swelling has improved in his feet as well and he continues to tolerate ambulation.  OBJECTIVE:  Vital Signs: Vitals:   06/30/23 2017 07/01/23 0030 07/01/23 0338 07/01/23 0816  BP: (!) 151/91 (!) 141/88 (!) 149/104 (!) 158/91  Pulse: (!) 107 (!) 109 86 88  Resp: 18 18 18 17   Temp: 97.7 F (36.5 C) 97.7 F (36.5 C)  97.9 F (36.6 C)  TempSrc:    Oral  SpO2: 100% 100% 100% 100%  Weight:   71.6 kg   Height:       Supplemental O2: Room Air SpO2: 100 %  Filed Weights   06/29/23 2159 07/01/23 0338  Weight: 79.7 kg 71.6 kg    Intake/Output Summary (Last 24 hours) at 07/01/2023 1229 Last data filed at 07/01/2023 1021 Gross per 24 hour  Intake 255 ml  Output 450 ml  Net -195 ml    Net IO Since Admission: -695 mL [07/01/23 1229]  CBC    Component Value Date/Time   WBC 5.0 07/01/2023 0712   RBC 3.56 (L) 07/01/2023 0712   HGB 10.9 (L) 07/01/2023 0712   HGB 10.6 (L) 06/24/2023 1008   HCT 32.8 (L) 07/01/2023 0712   HCT 33.8 (L) 06/24/2023 1008   PLT 171 07/01/2023 0712   PLT 262 06/24/2023 1008   MCV 92.1 07/01/2023 0712   MCV 95 06/24/2023 1008   MCH 30.6 07/01/2023 0712   MCHC 33.2 07/01/2023 0712   RDW 15.6 (H) 07/01/2023 0712   RDW 14.3 06/24/2023 1008   LYMPHSABS 0.2 (L) 06/29/2023 2216   LYMPHSABS 1.3 04/10/2019 1006   MONOABS 0.6 06/29/2023 2216   EOSABS 0.0 06/29/2023 2216   EOSABS 0.2  04/10/2019 1006   BASOSABS 0.0 06/29/2023 2216   BASOSABS 0.0 04/10/2019 1006   CMP     Component Value Date/Time   NA 140 07/01/2023 0712   NA 133 (L) 06/24/2023 1008   K 3.3 (L) 07/01/2023 0712   CL 102 07/01/2023 0712   CO2 28 07/01/2023 0712   GLUCOSE 380 (H) 07/01/2023 0712   BUN 24 (H) 07/01/2023 0712   BUN 26 06/24/2023 1008   CREATININE 0.74 07/01/2023 0712   CALCIUM 8.2 (L) 07/01/2023 0712   PROT 5.9 (L) 07/01/2023 0712   PROT 6.3 04/10/2019 1006   ALBUMIN 2.9 (L) 07/01/2023 0712   ALBUMIN 3.9 04/10/2019 1006   AST 44 (H) 07/01/2023 0712   ALT 111 (H) 07/01/2023 0712   ALKPHOS 83 07/01/2023 0712   BILITOT 0.7 07/01/2023 0712   BILITOT <0.2 04/10/2019 1006   EGFR 96 06/24/2023 1008   GFRNONAA >60 07/01/2023 1610    Physical Exam: Physical Exam Constitutional:      Appearance: Normal appearance.  HENT:     Head: Normocephalic and atraumatic.  Cardiovascular:     Rate and Rhythm: Normal rate and regular rhythm.     Comments: Edema improved but present up to his shins Pulmonary:     Effort:  Pulmonary effort is normal.     Breath sounds: Normal breath sounds.  Abdominal:     General: Abdomen is flat. Bowel sounds are normal.     Palpations: Abdomen is soft.  Musculoskeletal:     Right lower leg: 2+ Pitting Edema present.     Left lower leg: 2+ Pitting Edema present.  Skin:    General: Skin is warm.  Neurological:     General: No focal deficit present.     Mental Status: He is alert.    ASSESSMENT/PLAN:  Assessment: Principal Problem:   Generalized weakness   Plan: #Innumerable hypoenhancing liver masses, bilobar hepatic metastatic disease vs. primary hepatic malignancy  #Bilateral adrenal metastases  #Abnormal LFTs Right upper quadrant yesterday demonstrated numerous echogenic nodules throughout the liver that was suspicious for diffuse hepatic metastatic disease. This was further worked up with a CT of the chest, abdomen, and pelvis with contrast  to further characterize these findings and assess for potential primary. The CT chest, abdomen, and pelvis demonstrated innumerable hypoenhancing masses throughout the liver concerning for bilobar metastatic disease versus primary hepatic malignancy such as hepatocellular carcinoma. An AFP was ordered to further characterize concern for [facility]. The CT also found bilateral adrenal metastases.  LFTs also remain elevated likely secondary to the mets. An MRI of the brain with and without contrast was ordered to evaluate for brain metastases in the context of increased confusion at his previous clinic visit, but this demonstrated no acute findings. As the patient has been noted to have poor follow-up, we are concerned he is at risk for re-admission, so we will consult IR for biopsy of the liver and reach out to oncology for follow-up.  - Consult oncology - Consult IR for liver biopsy - Follow-up AFP  #Generalized weakness #Hypocalcemia, resolved  #Hypoalbuminemia  Patient endorsed mild improvement in his weakness yesterday but he still remains weak.  Corrected calcium of 9.1.  Xtandi for metastatic prostate cancer continues to be held as this can contribute to weakness.  Patient continues to deny any nausea, vomiting, diarrhea, or any other signs or symptoms.  - Trend BMP - Continue PT/OT - Continue to hold Xtandi  #Bilateral lower extremity swelling Etiology unknown but likely multifactorial.  Echo yesterday noted ejection fraction of 60% to 65% with grade 1 diastolic dysfunction.  He has history of hypoalbuminemia that was first noted in January 2009 which could be contributing through decreased oncotic pressure and subsequent fluid shift.  After beginning Ensure yesterday for protein supplementation, his albumin still remains decreased at 2.9.  There is moderate improvement in his swelling.  His weight is down to 157.9 pounds.  This is down from his recorded weight of 175.71 pounds on admission.  Patient  notes that he believes he is back at his baseline weight.  He had net -195 mL of fluid over the past 24 hours.  His Lasix will be held as patient endorses weakness and that he believes he is back at his baseline weight.  We will continue to monitor his volume status. - Hold Lasix and continue to monitor volume status - Ensure 3 times daily between meals - Strict I's/O's and daily weights  #Hypertension Patient's losartan was increased to 50 mg/day yesterday after systolic blood pressure was in the 160s.  Blood pressure is moderately improved with systolic in the 140s.  Patient continues to deny chest pain, vision changes, shortness of breath or any other signs or symptoms. - Continue losartan 50 mg once per day  #  Hypokalemia Potassium was noted to be 3.3 so this was replenished.  - Follow-up CMP  #T2DM Patient's glucose was noted to be persistently in the 380s to 400s.  He is currently not on any insulin at home but we we will begin basal insulin and continue to monitor. - Continue SSI - Begin glargine 10 units daily  - Monitor CBG  Best Practice: Diet: Regular diet VTE: enoxaparin (LOVENOX) injection 40 mg Start: 06/30/23 1400 Code: Full AB: None Therapy Recs: Home health PT  DISPO: Anticipated discharge  TBD  to Home pending  resolution of weakness and workup of malignancy .  Signature: Morrie Sheldon, MD Internal Medicine Resident, PGY-1 [facility]  Pager: [phone]  Please contact the on call pager after 5 pm and on weekends at [phone].

## 2023-07-01 NOTE — Progress Notes (Signed)
Physical Therapy Treatment Patient Details Name: William Jones MRN: [id number] DOB: 1951-03-19 Today's Date: 07/01/2023   History of Present Illness Pt is 72 yo arrived by EMS. Pt has noticed feet swelling and generalized weakness. Pt had high CBG and HTN on arrival. Recent admission on 9/8 for GI bleed. PMH: angiodysplasia of colon with hemorrhage, duodenitis, HTN, glaucoma, hemorrhage of rectum and anus, hx prostate cancer, hyperlipidemia, iron deficiency anemia, malignant tumor of prostate, peripheral neuropathy, rectal mass, thrombocytopenia, DM.    PT Comments  Pt presents today slightly weaker than yesterday with sit to stand but improved gait overall with improved balance. Pt demonstrates difficulty ascending stairs with reciprocal gait pattern due to weakness in the LLE; encourage to perform step to gait pattern using the RLE as strong leg with Cga for stair navigation.  Due to pt current functional status, home set up and available assistance at home recommending skilled physical therapy services 3x/weekly on discharge from acute care hospital setting in order to decrease risk for falls, injury and re-hospitalization. Pt tolerated treatment session well.      If plan is discharge home, recommend the following: Help with stairs or ramp for entrance     Equipment Recommendations  None recommended by PT       Precautions / Restrictions Precautions Precautions: Fall Restrictions Weight Bearing Restrictions: No     Mobility  Bed Mobility Overal bed mobility: Needs Assistance Bed Mobility: Sit to Supine       Sit to supine: Supervision   General bed mobility comments: Verbal cues for using the RLE to assist LLE for sitting to supine to progress bil LE into the bed    Transfers Overall transfer level: Needs assistance Equipment used: None Transfers: Sit to/from Stand Sit to Stand: Contact guard assist           General transfer comment: Pt is weaker today from  initial evaluation. CGA for stability on standing    Ambulation/Gait Ambulation/Gait assistance: Contact guard assist, Supervision Gait Distance (Feet): 200 Feet Assistive device: None Gait Pattern/deviations: Step-through pattern, Decreased step length - right, Decreased step length - left, Narrow base of support Gait velocity: decreased Gait velocity interpretation: 1.31 - 2.62 ft/sec, indicative of limited community ambulator   General Gait Details: continues with ER bil feet with no arm swing. improves with encouragement to perform normal gait pattern. Pt stability during gait improved to supervision with distance.   Stairs Stairs: Yes Stairs assistance: Contact guard assist Stair Management: Forwards, Step to pattern, Alternating pattern, One rail Left Number of Stairs: 5 General stair comments: Pt tried alternating pattern initially with weakness noted in the LLE with stepping up. Pt encouraged to perform step to leading with the RLE with significant improvement. Pt encourated to step down with tle L at [facility] for stair navigation this session      Balance Overall balance assessment: Needs assistance Sitting-balance support: Single extremity supported Sitting balance-Leahy Scale: Good     Standing balance support: Single extremity supported, No upper extremity supported, During functional activity Standing balance-Leahy Scale: Fair Standing balance comment: fair to good-. able to mobilize without AD no LOB this session      Cognition Arousal: Alert Behavior During Therapy: WFL for tasks assessed/performed Overall Cognitive Status: Within Functional Limits for tasks assessed         General Comments General comments (skin integrity, edema, etc.): continues with edema in bil LE with L>R      Pertinent Vitals/Pain Pain Assessment Pain Assessment:  No/denies pain     PT Goals (current goals can now be found in the care plan section) Acute Rehab PT Goals Patient  Stated Goal: to return home and improve strength PT Goal Formulation: With patient Time For Goal Achievement: 07/14/23 Potential to Achieve Goals: Good Progress towards PT goals: Progressing toward goals    Frequency    Min 1X/week      PT Plan  Continue with current POC       AM-PAC PT "6 Clicks" Mobility   Outcome Measure  Help needed turning from your back to your side while in a flat bed without using bedrails?: None Help needed moving from lying on your back to sitting on the side of a flat bed without using bedrails?: A Little Help needed moving to and from a bed to a chair (including a wheelchair)?: A Little Help needed standing up from a chair using your arms (e.g., wheelchair or bedside chair)?: A Little Help needed to walk in hospital room?: A Little Help needed climbing 3-5 steps with a railing? : A Little 6 Click Score: 19    End of Session Equipment Utilized During Treatment: Gait belt Activity Tolerance: Patient tolerated treatment well Patient left: with call bell/phone within reach;in bed Nurse Communication: Mobility status PT Visit Diagnosis: Other abnormalities of gait and mobility (R26.89);Unsteadiness on feet (R26.81);Muscle weakness (generalized) (M62.81)     Time: 0981-1914 PT Time Calculation (min) (ACUTE ONLY): 23 min  Charges:    $Gait Training: 8-22 mins $Therapeutic Activity: 8-22 mins PT General Charges $$ ACUTE PT VISIT: 1 Visit                    Harrel Carina, DPT, CLT  Acute Rehabilitation Services Office: [phone] (Secure chat preferred)    Claudia Desanctis 07/01/2023, 11:14 AM

## 2023-07-01 NOTE — Progress Notes (Signed)
Interventional Radiology Brief Note:  IR consulted for liver lesion biopsy.  Case reviewed and approved by Dr. Loreta Ave.  Will plan to proceed with biopsy early next week as schedule allows.   Loyce Dys, MS RD PA-C

## 2023-07-01 NOTE — Plan of Care (Signed)

## 2023-07-01 NOTE — Inpatient Diabetes Management (Signed)
Inpatient Diabetes Program Recommendations  AACE/ADA: New Consensus Statement on Inpatient Glycemic Control (2015)  Target Ranges:  Prepandial:   less than 140 mg/dL      Peak postprandial:   less than 180 mg/dL (1-2 hours)      Critically ill patients:  140 - 180 mg/dL   Lab Results  Component Value Date   GLUCAP 309 (H) 07/01/2023   HGBA1C 6.5 (A) 06/24/2023    Review of Glycemic Control  Latest Reference Range & Units 06/30/23 16:48 06/30/23 17:31 06/30/23 18:12 06/30/23 21:55 07/01/23 08:15  Glucose-Capillary 70 - 99 mg/dL 161 (H) 096 (H) 045 (H) 387 (H) 309 (H)  (H): Data is abnormally high  Latest Reference Range & Units 06/24/23 10:08  Hemoglobin 13.0 - 17.7 g/dL 40.9 (L)  (L): Data is abnormally low  Diabetes history: DM2 Outpatient Diabetes medications: Jardiance 10 mg every day, Metformin (not taking) Current orders for Inpatient glycemic control: Novolog 0-15 units TID and 0-5 units QHS  Inpatient Diabetes Program Recommendations:    Last A1C was 6.5%; however HGB was 10.6 when it was drawn on 9/20 which may be inaccurate.    Please consider Semglee 12 units every day.    Will continue to follow while inpatient.  Thank you, Dulce Sellar, MSN, CDCES Diabetes Coordinator Inpatient Diabetes Program [phone] (team pager from 8a-5p)

## 2023-07-02 DIAGNOSIS — K769 Liver disease, unspecified: Secondary | ICD-10-CM | POA: Diagnosis not present

## 2023-07-02 DIAGNOSIS — R531 Weakness: Secondary | ICD-10-CM

## 2023-07-02 LAB — COMPREHENSIVE METABOLIC PANEL
ALT: 137 U/L — ABNORMAL HIGH (ref 0–44)
AST: 74 U/L — ABNORMAL HIGH (ref 15–41)
Albumin: 3 g/dL — ABNORMAL LOW (ref 3.5–5.0)
Alkaline Phosphatase: 98 U/L (ref 38–126)
Anion gap: 10 (ref 5–15)
BUN: 19 mg/dL (ref 8–23)
CO2: 25 mmol/L (ref 22–32)
Calcium: 8.7 mg/dL — ABNORMAL LOW (ref 8.9–10.3)
Chloride: 105 mmol/L (ref 98–111)
Creatinine, Ser: 0.68 mg/dL (ref 0.61–1.24)
GFR, Estimated: >60 mL/min (ref >60)
Glucose, Bld: 232 mg/dL — ABNORMAL HIGH (ref 70–99)
Potassium: 4.1 mmol/L (ref 3.5–5.1)
Sodium: 140 mmol/L (ref 135–145)
Total Bilirubin: 0.5 mg/dL (ref 0.3–1.2)
Total Protein: 6.6 g/dL (ref 6.5–8.1)

## 2023-07-02 LAB — CBC
HCT: 34.8 % — ABNORMAL LOW (ref 39.0–52.0)
Hemoglobin: 11.5 g/dL — ABNORMAL LOW (ref 13.0–17.0)
MCH: 30.4 pg (ref 26.0–34.0)
MCHC: 33 g/dL (ref 30.0–36.0)
MCV: 92.1 fL (ref 80.0–100.0)
Platelets: 170 10*3/uL (ref 150–400)
RBC: 3.78 MIL/uL — ABNORMAL LOW (ref 4.22–5.81)
RDW: 15.7 % — ABNORMAL HIGH (ref 11.5–15.5)
WBC: 6.7 10*3/uL (ref 4.0–10.5)
nRBC: 0 % (ref 0.0–0.2)

## 2023-07-02 LAB — GLUCOSE, CAPILLARY
Glucose-Capillary: 219 mg/dL — ABNORMAL HIGH (ref 70–99)
Glucose-Capillary: 240 mg/dL — ABNORMAL HIGH (ref 70–99)
Glucose-Capillary: 156 mg/dL — ABNORMAL HIGH (ref 70–99)
Glucose-Capillary: 177 mg/dL — ABNORMAL HIGH (ref 70–99)

## 2023-07-02 LAB — AFP TUMOR MARKER: AFP, Serum, Tumor Marker: 6.5 ng/mL (ref 0.0–8.4)

## 2023-07-02 LAB — PSA: Prostatic Specific Antigen: <0.01 ng/mL (ref 0.00–4.00)

## 2023-07-02 MED ORDER — EMPAGLIFLOZIN 10 MG PO TABS
10.0000 mg | ORAL_TABLET | Freq: Every day | ORAL | Status: DC
  Administered 2023-07-03 – 2023-07-05 (×2): 10 mg via ORAL
  Filled 2023-07-02 (×3): qty 1

## 2023-07-02 NOTE — Consult Note (Cosign Needed Addendum)
Chief Complaint: Patient was seen in consultation today for liver mass biopsy   Referring Physician(s): Dr. Morrie Sheldon Dr. Al Pimple  Supervising Physician: Roanna Banning  Patient Status: [facility] - In-pt  History of Present Illness: William Jones is a 72 y.o. male with hx of prostate cancer, admitted with weakness. Liver enzymes found to be elevated so imaging studies pursued. Findings of liver lesions concerning for mets.  IR is asked to review for possible biopsy.  PMHx, meds, labs, imaging, allergies reviewed. Feels better since admission, no recent fevers, chills, illness.     Past Medical History:  Diagnosis Date   Angiodysplasia of colon with hemorrhage 10/14/2020   Duodenitis    Essential hypertension 07/16/2015   Fatigue 11/06/2020   Glaucoma    H/O: substance abuse (HCC)    Hemorrhage of rectum and anus 10/14/2020   History of frostbite    History of prostate cancer 03/20/2014   Lost to f/u with [facility] in the past 2011 with elevated PSA >30 at that time.  Saw [facility] (Dr. Isabel Caprice) on 03/13/14. Cancer Noted 03/13/14 office visit. Gleason score 7. Plan CT Ab/pelvis with contrast, bone scan    History of radiation therapy nov 2015 to feb 2016   Hyperlipidemia 11/20/2019   Iron deficiency anemia    Malignant tumor of prostate (HCC) 10/14/2020   Peripheral neuropathy    Prostate cancer (HCC) 03/05/14   Gleason 7, volume 45 gm   Rash and nonspecific skin eruption 08/04/2017   Rectal mass 09/15/2016   S/P radiation therapy 09/18/14-11/15/14   prostate/ 7800Gy/40sessions   Thrombocytopenia [facility])     Past Surgical History:  Procedure Laterality Date   BIOPSY  06/11/2023   Procedure: BIOPSY;  Surgeon: Benancio Deeds, MD;  Location: [facility] ENDOSCOPY;  Service: Gastroenterology;;   COLONOSCOPY N/A 01/10/2014   Procedure: COLONOSCOPY;  Surgeon: Theda Belfast, MD;  Location: [facility] ENDOSCOPY;  Service: Endoscopy;  Laterality: N/A;    ESOPHAGOGASTRODUODENOSCOPY N/A 01/10/2014   Procedure: ESOPHAGOGASTRODUODENOSCOPY (EGD);  Surgeon: Theda Belfast, MD;  Location: [facility] ENDOSCOPY;  Service: Endoscopy;  Laterality: N/A;   ESOPHAGOGASTRODUODENOSCOPY (EGD) WITH PROPOFOL N/A 06/11/2023   Procedure: ESOPHAGOGASTRODUODENOSCOPY (EGD) WITH PROPOFOL;  Surgeon: Benancio Deeds, MD;  Location: [facility] ENDOSCOPY;  Service: Gastroenterology;  Laterality: N/A;   FLEXIBLE SIGMOIDOSCOPY N/A 09/19/2015   Procedure: FLEXIBLE SIGMOIDOSCOPY;  Surgeon: Jeani Hawking, MD;  Location: [facility] ENDOSCOPY;  Service: Endoscopy;  Laterality: N/A;   FLEXIBLE SIGMOIDOSCOPY N/A 06/03/2017   Procedure: FLEXIBLE SIGMOIDOSCOPY;  Surgeon: Jeani Hawking, MD;  Location: [facility] ENDOSCOPY;  Service: Endoscopy;  Laterality: N/A;   FLEXIBLE SIGMOIDOSCOPY N/A 01/06/2018   Procedure: FLEXIBLE SIGMOIDOSCOPY;  Surgeon: Jeani Hawking, MD;  Location: [facility] ENDOSCOPY;  Service: Endoscopy;  Laterality: N/A;   GIVENS CAPSULE STUDY N/A 01/10/2014   Procedure: GIVENS CAPSULE STUDY;  Surgeon: Theda Belfast, MD;  Location: [facility] ENDOSCOPY;  Service: Endoscopy;  Laterality: N/A;   HOT HEMOSTASIS N/A 09/19/2015   Procedure: HOT HEMOSTASIS (ARGON PLASMA COAGULATION/BICAP);  Surgeon: Jeani Hawking, MD;  Location: [facility] ENDOSCOPY;  Service: Endoscopy;  Laterality: N/A;   HOT HEMOSTASIS N/A 06/03/2017   Procedure: HOT HEMOSTASIS (ARGON PLASMA COAGULATION/BICAP);  Surgeon: Jeani Hawking, MD;  Location: [facility] ENDOSCOPY;  Service: Endoscopy;  Laterality: N/A;   PROSTATE BIOPSY  03/05/14   Gleason 7, vol 45 gm    Allergies: Patient has no known allergies.  Medications:  Current Facility-Administered Medications:    dorzolamide (TRUSOPT) 2 % ophthalmic solution 1 drop, 1 drop, Right Eye,  BID, Morrie Sheldon, MD, 1 drop at 07/02/23 0914   [START ON 07/03/2023] empagliflozin (JARDIANCE) tablet 10 mg, 10 mg, Oral, QAC breakfast, Rana Snare, DO   enoxaparin (LOVENOX) injection 40 mg, 40 mg, Subcutaneous, Q24H, Masters,  Katie, DO, 40 mg at 07/01/23 1209   feeding supplement (ENSURE ENLIVE / ENSURE PLUS) liquid 237 mL, 237 mL, Oral, TID BM, Morrie Sheldon, MD, 237 mL at 07/02/23 0912   ferrous sulfate tablet 325 mg, 325 mg, Oral, Daily, Masters, Katie, DO, 325 mg at 07/02/23 0912   insulin aspart (novoLOG) injection 0-15 Units, 0-15 Units, Subcutaneous, TID WC, Masters, Katie, DO, 5 Units at 07/02/23 0913   insulin aspart (novoLOG) injection 0-5 Units, 0-5 Units, Subcutaneous, QHS, Masters, Katie, DO, 2 Units at 07/01/23 2205   insulin glargine-yfgn (SEMGLEE) injection 10 Units, 10 Units, Subcutaneous, Daily, Morrie Sheldon, MD, 10 Units at 07/02/23 0913   losartan (COZAAR) tablet 50 mg, 50 mg, Oral, Daily, Morrie Sheldon, MD, 50 mg at 07/02/23 0912   pantoprazole (PROTONIX) EC tablet 40 mg, 40 mg, Oral, Daily, Masters, Katie, DO, 40 mg at 07/02/23 0913   timolol (TIMOPTIC) 0.5 % ophthalmic solution 1 drop, 1 drop, Right Eye, BID, Morrie Sheldon, MD, 1 drop at 07/02/23 8119    Family History  Problem Relation Age of Onset   Kidney failure Brother 20       has been on HD since age 19    Edema Mother        Legs   Arthritis Sister        knees   Stroke Maternal Uncle        70s-80s   Cancer Neg Hx     Social History   Socioeconomic History   Marital status: Widowed    Spouse name: [name]   Number of children: 2   Years of education: Not on file   Highest education level: Not on file  Occupational History   Occupation: Disabled  Tobacco Use   Smoking status: Former    Current packs/day: 0.00    Average packs/day: 0.5 packs/day for 30.0 years (15.0 ttl pk-yrs)    Types: Cigarettes    Start date: 10/04/1977    Quit date: 10/05/2007    Years since quitting: 15.7   Smokeless tobacco: Never  Vaping Use   Vaping status: Never Used  Substance and Sexual Activity   Alcohol use: Not Currently    Alcohol/week: 0.0 standard drinks of alcohol    Comment: no alcohol for five years (per  conversation 2020)   Drug use: No    Types: "Crack" cocaine, Marijuana    Comment: per Epic note 2009hx of crack cocaine use 12 years ago per pt, no current marijuana use per pt   Sexual activity: Yes  Other Topics Concern   Not on file  Social History Narrative   On disability - frostbite. Used to work for [employer]. Retired in 1990. Has 2 kids. 6th grade. Resides with significant other.      Current Social History 06/08/2019        Patient lives with a significant other in a/an apartment which is 1 story/stories. There are steps up to the entrance the patient uses.       Patient's method of transportation is personal car.      The highest level of education was some high school.      The patient currently disabled.      Identified important Relationships are Office manager (Fiance)  and [name] (Daughter)       Pets : None       Interests / Fun: "Going to the park and walking around. Go visit people that we know."       Current Stressors: "I don't have any"       Religious / Personal Beliefs: "Holiness"       L. Leward Quan, RN, BSN       Social Determinants of Health   Financial Resource Strain: Low Risk  (06/25/2020)   Overall Financial Resource Strain (CARDIA)    Difficulty of Paying Living Expenses: Not hard at all  Food Insecurity: No Food Insecurity (06/30/2023)   Hunger Vital Sign    Worried About Running Out of Food in the Last Year: Never true    Ran Out of Food in the Last Year: Never true  Transportation Needs: No Transportation Needs (06/30/2023)   PRAPARE - Administrator, Civil Service (Medical): No    Lack of Transportation (Non-Medical): No  Physical Activity: Sufficiently Active (06/25/2020)   Exercise Vital Sign    Days of Exercise per Week: 5 days    Minutes of Exercise per Session: 150+ min  Stress: No Stress Concern Present (10/20/2022)   [employer] of Occupational Health - Occupational Stress Questionnaire    Feeling of  Stress : Not at all  Social Connections: Moderately Integrated (06/25/2020)   Social Connection and Isolation Panel [NHANES]    Frequency of Communication with Friends and Family: More than three times a week    Frequency of Social Gatherings with Friends and Family: Twice a week    Attends Religious Services: More than 4 times per year    Active Member of [employer] or Organizations: No    Attends Banker Meetings: Never    Marital Status: Living with partner    Review of Systems: A 12 point ROS discussed and pertinent positives are indicated in the HPI above.  All other systems are negative.  Review of Systems  Vital Signs: BP (!) 169/95 (BP Location: Right Arm)   Pulse 69   Temp 97.7 F (36.5 C) (Oral)   Resp 17   Ht 5\' 8"  (1.727 m)   Wt 161 lb 9.6 oz (73.3 kg)   SpO2 100%   BMI 24.57 kg/m   Physical Exam Constitutional:      Appearance: He is not ill-appearing.  HENT:     Mouth/Throat:     Mouth: Mucous membranes are moist.     Pharynx: Oropharynx is clear.  Cardiovascular:     Rate and Rhythm: Normal rate and regular rhythm.     Heart sounds: Normal heart sounds.  Pulmonary:     Effort: Pulmonary effort is normal. No respiratory distress.     Breath sounds: Normal breath sounds.  Abdominal:     General: There is no distension.     Palpations: Abdomen is soft. There is no mass.  Skin:    General: Skin is warm and dry.     Coloration: Skin is not jaundiced.  Neurological:     General: No focal deficit present.     Mental Status: He is alert and oriented to person, place, and time.  Psychiatric:        Mood and Affect: Mood normal.        Thought Content: Thought content normal.     Imaging: CT CHEST ABDOMEN PELVIS W CONTRAST  Result Date: 06/30/2023 CLINICAL DATA:  Metastatic disease of  unknown primary. * Tracking Code: BO * EXAM: CT CHEST, ABDOMEN, AND PELVIS WITH CONTRAST TECHNIQUE: Multidetector CT imaging of the chest, abdomen and pelvis was  performed following the standard protocol during bolus administration of intravenous contrast. RADIATION DOSE REDUCTION: This exam was performed according to the departmental dose-optimization program which includes automated exposure control, adjustment of the mA and/or kV according to patient size and/or use of iterative reconstruction technique. CONTRAST:  75mL OMNIPAQUE IOHEXOL 350 MG/ML SOLN COMPARISON:  CT abdomen pelvis 10/30/2018 FINDINGS: CT CHEST FINDINGS Cardiovascular: Mild coronary artery calcification. Global cardiac size within normal limits. No pericardial effusion. Central pulmonary arteries are of normal caliber. No aortic aneurysm. No significant atherosclerotic calcification within thoracic aorta. Mediastinum/Nodes: No enlarged mediastinal, hilar, or axillary lymph nodes. Thyroid gland, trachea, and esophagus demonstrate no significant findings. Lungs/Pleura: Lungs are clear. No pleural effusion or pneumothorax. Musculoskeletal: No lytic or blastic bone lesion. No acute bone abnormality. CT ABDOMEN PELVIS FINDINGS Hepatobiliary: Innumerable hypoenhancing masses are seen throughout the liver, most in keeping with bilobar hepatic metastatic disease or a primary hepatic malignancy such as multifocal hepatocellular carcinoma. Index lesion within the subcapsular segment 4 of the liver measures 1.6 x 2.9 cm at axial image # 56/4. No intra or extrahepatic biliary ductal dilation. Gallbladder unremarkable. Portal vein is patent. Hepatic veins are patent. Pancreas: Unremarkable. No pancreatic ductal dilatation or surrounding inflammatory changes. Spleen: Normal in size without focal abnormality. Adrenals/Urinary Tract: The adrenal glands demonstrate nodular enlargement and hyperenhancement in keeping with bilateral adrenal metastases. Right adrenal gland measures 2.1 x 4.1 cm at image # 59/4. Left adrenal gland measures 2.2 x 3.1 cm at image # 54/4. The kidneys are normal in size and position. 2 mm  nonobstructing calculus noted within the interpolar region of the right kidney. The kidneys are otherwise unremarkable. The bladder is unremarkable. Stomach/Bowel: Stomach is within normal limits. Appendix appears normal. No evidence of bowel wall thickening, distention, or inflammatory changes. Vascular/Lymphatic: Aortic atherosclerosis. No enlarged abdominal or pelvic lymph nodes. Reproductive: Fiducial markers are noted within the prostate gland. The prostate gland is otherwise unremarkable. Other: Small fat containing umbilical hernia. Musculoskeletal: No lytic or blastic bone lesions. No acute bone abnormality. Osseous structures are age-appropriate. IMPRESSION: 1. Innumerable hypoenhancing masses throughout the liver, most in keeping with bilobar hepatic metastatic disease or a primary hepatic malignancy such as multifocal hepatocellular carcinoma. Correlation with serum alpha fetoprotein level may be helpful. If indicated, widespread hepatic metastatic disease should be easily amenable to ultrasound-guided tissue sampling for further evaluation. 2. Bilateral adrenal metastases. 3. Mild coronary artery calcification. 4. Nonobstructing right renal calculus. Aortic Atherosclerosis (ICD10-I70.0). Electronically Signed   By: Helyn Numbers M.D.   On: 06/30/2023 23:17   MR BRAIN W WO CONTRAST  Result Date: 06/30/2023 CLINICAL DATA:  Metastatic disease evaluation. EXAM: MRI HEAD WITHOUT AND WITH CONTRAST TECHNIQUE: Multiplanar, multiecho pulse sequences of the brain and surrounding structures were obtained without and with intravenous contrast. CONTRAST:  8mL GADAVIST GADOBUTROL 1 MMOL/ML IV SOLN COMPARISON:  Head CT 02/12/2018. FINDINGS: Brain: No acute infarct or hemorrhage. No hydrocephalus or extra-axial collection. No foci of abnormal susceptibility. No mass or abnormal enhancement. Vascular: Normal flow voids and vessel enhancement. Skull and upper cervical spine: Normal marrow signal and enhancement.  Sinuses/Orbits: Near-complete opacification of the frontal sinuses, maxillary sinuses and right sphenoid sinus. Orbits are unremarkable. Other: None. IMPRESSION: 1. No evidence of intracranial metastatic disease. 2. Near-complete opacification of the frontal sinuses, maxillary sinuses and right sphenoid sinus. Electronically Signed  By: Orvan Falconer M.D.   On: 06/30/2023 21:43   US Abdomen Limited RUQ (LIVER/GB)  Result Date: 06/30/2023 CLINICAL DATA:  Elevated liver enzymes EXAM: ULTRASOUND ABDOMEN LIMITED RIGHT UPPER QUADRANT COMPARISON:  None Available. FINDINGS: Gallbladder: No gallstones or wall thickening visualized. No sonographic Murphy sign noted by sonographer. Common bile duct: Diameter: 0.2 cm Liver: Scattered mostly echogenic nodules are present throughout the liver. These were not present the CT of 10/30/2018. Some have a targetoid appearance as on image 37 of series 1. Appearance suspicious for diffuse hepatic metastatic disease. Portal vein is patent on color Doppler imaging with normal direction of blood flow towards the liver. Other: None. IMPRESSION: 1. Numerous echogenic nodules throughout the liver, suspicious for diffuse hepatic metastatic disease. Consider further workup such as CT of the chest, abdomen, pelvis with contrast for further characterization and to assess for potential primary. PET-CT could also be an alternative imaging choice or adjunct. The patient has a history of prostate cancer but this would be an unusual metastatic pathway for prostate cancer, and a gastrointestinal primary or liver primary would be more common. The possibility of macronodular cirrhosis is not entirely discarded but seems less likely given the appearance. Electronically Signed   By: Gaylyn Rong M.D.   On: 06/30/2023 13:45   ECHOCARDIOGRAM COMPLETE  Result Date: 06/30/2023    ECHOCARDIOGRAM REPORT   Patient Name:   ESIAS MCTIER Date of Exam: 06/30/2023 Medical Rec #:  [id number]              Height:       68.0 in Accession #:    [id number]            Weight:       175.7 lb Date of Birth:  [birth year]              BSA:          1.934 m Patient Age:    72 years              BP:           146/71 mmHg Patient Gender: M                     HR:           71 bpm. Exam Location:  Inpatient Procedure: 2D Echo, Cardiac Doppler and Color Doppler Indications:    R06.9 DOE; R60.0 Lower extremity edema; R53.83 Fatigue  History:        Patient has no prior history of Echocardiogram examinations.                 Signs/Symptoms:Dyspnea, Edema and Fatigue; Risk                 Factors:Hypertension, Diabetes, Former Smoker, History of                 substance abuse. and Dyslipidemia. Patient denies chest pain. He                 does have DOE, leg edema and extreme fatigue since 06/06/2023.  Sonographer:    Carlos American RVT, RDCS (AE), RDMS Referring Phys: [id number] EMILY B MULLEN  Sonographer Comments: Suboptimal apical window. Image acquisition challenging due to respiratory motion. IMPRESSIONS  1. Left ventricular ejection fraction, by estimation, is 60 to 65%. The left ventricle has normal function. The left ventricle has no regional wall motion abnormalities. There is mild concentric left ventricular hypertrophy. Left  ventricular diastolic parameters are consistent with Grade I diastolic dysfunction (impaired relaxation).  2. Right ventricular systolic function is normal. The right ventricular size is normal.  3. The mitral valve is normal in structure. Trivial mitral valve regurgitation. No evidence of mitral stenosis.  4. The aortic valve is normal in structure. Aortic valve regurgitation is not visualized. Aortic valve sclerosis/calcification is present, without any evidence of aortic stenosis.  5. The inferior vena cava is normal in size with greater than 50% respiratory variability, suggesting right atrial pressure of 3 mmHg. FINDINGS  Left Ventricle: Left ventricular ejection fraction, by estimation, is 60 to  65%. The left ventricle has normal function. The left ventricle has no regional wall motion abnormalities. The left ventricular internal cavity size was normal in size. There is  mild concentric left ventricular hypertrophy. Left ventricular diastolic parameters are consistent with Grade I diastolic dysfunction (impaired relaxation). Right Ventricle: The right ventricular size is normal. No increase in right ventricular wall thickness. Right ventricular systolic function is normal. Left Atrium: Left atrial size was normal in size. Right Atrium: Right atrial size was normal in size. Pericardium: There is no evidence of pericardial effusion. Mitral Valve: The mitral valve is normal in structure. Trivial mitral valve regurgitation. No evidence of mitral valve stenosis. Tricuspid Valve: The tricuspid valve is normal in structure. Tricuspid valve regurgitation is trivial. No evidence of tricuspid stenosis. Aortic Valve: The aortic valve is normal in structure. Aortic valve regurgitation is not visualized. Aortic valve sclerosis/calcification is present, without any evidence of aortic stenosis. Aortic valve mean gradient measures 5.0 mmHg. Aortic valve peak  gradient measures 11.3 mmHg. Aortic valve area, by VTI measures 3.00 cm. Pulmonic Valve: The pulmonic valve was normal in structure. Pulmonic valve regurgitation is trivial. No evidence of pulmonic stenosis. Aorta: The aortic root is normal in size and structure. Venous: The inferior vena cava is normal in size with greater than 50% respiratory variability, suggesting right atrial pressure of 3 mmHg. IAS/Shunts: No atrial level shunt detected by color flow Doppler.  LEFT VENTRICLE PLAX 2D LVIDd:         3.90 cm   Diastology LVIDs:         2.10 cm   LV e' medial:    5.44 cm/s LV PW:         1.20 cm   LV E/e' medial:  9.3 LV IVS:        1.00 cm   LV e' lateral:   7.18 cm/s LVOT diam:     2.10 cm   LV E/e' lateral: 7.1 LV SV:         76 LV SV Index:   39 LVOT Area:      3.46 cm  RIGHT VENTRICLE RV S prime:     17.40 cm/s TAPSE (M-mode): 2.5 cm LEFT ATRIUM           Index        RIGHT ATRIUM           Index LA diam:      4.30 cm 2.22 cm/m   RA Area:     13.70 cm LA Vol (A2C): 72.1 ml 37.27 ml/m  RA Volume:   29.30 ml  15.15 ml/m LA Vol (A4C): 42.0 ml 21.71 ml/m  AORTIC VALVE                     PULMONIC VALVE AV Area (Vmax):    2.66 cm  PV Vmax:          1.10 m/s AV Area (Vmean):   2.63 cm      PV Peak grad:     4.8 mmHg AV Area (VTI):     3.00 cm      PR End Diast Vel: 4.38 msec AV Vmax:           168.00 cm/s AV Vmean:          107.000 cm/s AV VTI:            0.254 m AV Peak Grad:      11.3 mmHg AV Mean Grad:      5.0 mmHg LVOT Vmax:         129.00 cm/s LVOT Vmean:        81.400 cm/s LVOT VTI:          0.220 m LVOT/AV VTI ratio: 0.87  AORTA Ao Root diam: 3.50 cm Ao Asc diam:  3.40 cm Ao Arch diam: 3.2 cm MITRAL VALVE                TRICUSPID VALVE MV Area (PHT): 2.08 cm     TR Peak grad:   31.8 mmHg MV Decel Time: 365 msec     TR Vmax:        282.00 cm/s MV E velocity: 50.70 cm/s MV A velocity: 107.50 cm/s  SHUNTS MV E/A ratio:  0.47         Systemic VTI:  0.22 m                             Systemic Diam: 2.10 cm Kardie Tobb DO Electronically signed by Thomasene Ripple DO Signature Date/Time: 06/30/2023/12:36:13 PM    Final    DG Chest Port 1 View  Result Date: 06/29/2023 CLINICAL DATA:  Weakness EXAM: PORTABLE CHEST 1 VIEW COMPARISON:  None Available. FINDINGS: The heart size and mediastinal contours are within normal limits. Both lungs are clear. The visualized skeletal structures are unremarkable. IMPRESSION: No active disease. Electronically Signed   By: Darliss Cheney M.D.   On: 06/29/2023 23:28   DG Chest 2 View  Result Date: 06/10/2023 CLINICAL DATA:  Shortness of breath. EXAM: CHEST - 2 VIEW COMPARISON:  December 06, 2014 FINDINGS: The heart size and mediastinal contours are within normal limits. Very mild focal scarring and/or atelectasis is seen along the  periphery of the mid right lung. There is no evidence of an acute infiltrate, pleural effusion or pneumothorax. Multiple chronic left-sided rib fractures are noted. Multilevel degenerative changes seen throughout the thoracic spine. IMPRESSION: Very mild mid right lung focal scarring and/or atelectasis. Correlation with nonemergent chest CT is recommended to exclude the presence of a small pulmonary nodule. Electronically Signed   By: Aram Candela M.D.   On: 06/10/2023 20:46    Labs:  CBC: Recent Labs    06/24/23 1008 06/29/23 2216 06/29/23 2313 06/30/23 0258 07/01/23 0712  WBC 7.0 4.6  --  4.4 5.0  HGB 10.6* 10.2* 10.2* 11.0* 10.9*  HCT 33.8* 30.7* 30.0* 32.7* 32.8*  PLT 262 163  --  161 171    COAGS: Recent Labs    06/11/23 0203 06/30/23 1113  INR 1.1 1.1  APTT 22*  --     BMP: Recent Labs    06/20/23 0901 06/24/23 1008 06/29/23 2216 06/29/23 2313 06/30/23 0258 06/30/23 1113 07/01/23 0712  NA 136 133* 136 136 139  --  140  K 3.2* 4.4 3.0* 3.1* 2.8* 3.5 3.3*  CL 97* 94* 98  --  97*  --  102  CO2 28 25 21*  --  26  --  28  GLUCOSE 195* 449* 383*  --  329*  --  380*  BUN 12 26 25*  --  19  --  24*  CALCIUM 7.6* 9.8 7.3*  --  7.2*  --  8.2*  CREATININE 0.67 0.78 0.82  --  0.77  --  0.74  GFRNONAA >60  --  >60  --  >60  --  >60    LIVER FUNCTION TESTS: Recent Labs    06/11/23 0203 06/29/23 2216 06/30/23 0258 07/01/23 0712  BILITOT 0.9 1.0 1.1 0.7  AST 30 40 45* 44*  ALT 40 112* 118* 111*  ALKPHOS 41 79 85 83  PROT 5.8* 5.4* 5.9* 5.9*  ALBUMIN 2.8* 2.7* 2.9* 2.9*     Assessment and Plan: Liver lesions Hx of prostate cancer Imaging reviewed, amenable to percutaneous liver lesion biopsy. Labs reviewed. Risks and benefits of liver lesion biopsy was discussed with the patient and/or patient's family including, but not limited to bleeding, infection, damage to adjacent structures or low yield requiring additional tests.  All of the questions were  answered and there is agreement to proceed.  Consent signed and in chart.  Tentative plan for procedure on Mon 9/30, holding Lovenox tomorrow and Monday Made NPO p MN for Monday   Electronically Signed: Brayton El, PA-C 07/02/2023, 10:31 AM   I spent a total of 20 minutes in face to face in clinical consultation, greater than 50% of which was counseling/coordinating care for liver biopsy

## 2023-07-02 NOTE — Progress Notes (Signed)
HD#2 SUBJECTIVE:  Patient Summary: William Jones is a 72 y.o. 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   Overnight Events: no acute events  Interim History: Patient states feeling well this morning.  Able to ambulate much better than before.  No new concerns at this time.  Leg swelling is stable and not worsening.  Discussed with patient about further workup for liver metastasis versus primary, IR liver biopsy and oncology follow-up.   OBJECTIVE:  Vital Signs: Vitals:   07/02/23 0300 07/02/23 0608 07/02/23 0730 07/02/23 0900  BP:  (!) 181/89 (!) 185/84 (!) 169/95  Pulse:  66 69   Resp:  17    Temp:  97.8 F (36.6 C) 97.7 F (36.5 C)   TempSrc:  Oral Oral   SpO2:  100% 100%   Weight: 73.3 kg     Height:       Supplemental O2: Room Air SpO2: 100 %  Filed Weights   06/29/23 2159 07/01/23 0338 07/02/23 0300  Weight: 79.7 kg 71.6 kg 73.3 kg    Intake/Output Summary (Last 24 hours) at 07/02/2023 0603 Last data filed at 07/01/2023 1021 Gross per 24 hour  Intake 255 ml  Output 450 ml  Net -195 ml    Net IO Since Admission: -695 mL [07/02/23 0603]  CBC    Component Value Date/Time   WBC 5.0 07/01/2023 0712   RBC 3.56 (L) 07/01/2023 0712   HGB 10.9 (L) 07/01/2023 0712   HGB 10.6 (L) 06/24/2023 1008   HCT 32.8 (L) 07/01/2023 0712   HCT 33.8 (L) 06/24/2023 1008   PLT 171 07/01/2023 0712   PLT 262 06/24/2023 1008   MCV 92.1 07/01/2023 0712   MCV 95 06/24/2023 1008   MCH 30.6 07/01/2023 0712   MCHC 33.2 07/01/2023 0712   RDW 15.6 (H) 07/01/2023 0712   RDW 14.3 06/24/2023 1008   LYMPHSABS 0.2 (L) 06/29/2023 2216   LYMPHSABS 1.3 04/10/2019 1006   MONOABS 0.6 06/29/2023 2216   EOSABS 0.0 06/29/2023 2216   EOSABS 0.2 04/10/2019 1006   BASOSABS 0.0 06/29/2023 2216   BASOSABS 0.0 04/10/2019 1006    CMP     Component Value Date/Time   NA 140 07/01/2023 0712   NA 133 (L) 06/24/2023 1008   K 3.3 (L) 07/01/2023 0712   CL 102 07/01/2023 0712   CO2 28 07/01/2023 0712   GLUCOSE 380 (H) 07/01/2023 0712   BUN 24 (H) 07/01/2023 0712   BUN 26 06/24/2023 1008   CREATININE 0.74 07/01/2023 0712   CALCIUM 8.2 (L) 07/01/2023 0712   PROT 5.9 (L) 07/01/2023 0712   PROT 6.3 04/10/2019 1006   ALBUMIN 2.9 (L) 07/01/2023 0712   ALBUMIN 3.9 04/10/2019 1006   AST 44 (H) 07/01/2023 0712   ALT 111 (H) 07/01/2023 0712   ALKPHOS 83 07/01/2023 0712   BILITOT 0.7 07/01/2023 0712   BILITOT <0.2 04/10/2019 1006   EGFR 96 06/24/2023 1008   GFRNONAA >60 07/01/2023 4034    Physical Exam: Physical Exam Constitutional:      General: He is not in acute distress.    Appearance: Normal appearance.  HENT:     Head: Normocephalic and atraumatic.  Cardiovascular:     Rate and Rhythm: Normal rate and regular rhythm.  Pulmonary:     Effort: Pulmonary effort is normal.  Breath sounds: Normal breath sounds.  Musculoskeletal:     Right lower leg: 1+ Pitting Edema present.     Left lower leg: 1+ Pitting Edema present.     Comments: Edema up to shins bilaterally, improving.  Skin:    General: Skin is warm.  Neurological:     General: No focal deficit present.     Mental Status: He is alert.    ASSESSMENT/PLAN:  Assessment: Principal Problem:   Generalized weakness  William Jones is a 72 y.o. 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  Plan: #Innumerable hypoenhancing liver masses, bilobar hepatic metastatic disease vs. primary hepatic malignancy  #Bilateral adrenal metastases  #Abnormal LFTs Numerous hypoenhancing masses throughout liver concerning for metastatic vs primary. AFP was normal 6.5. No acute findings on brain  MRI. Oncology consulted and following. IR consulted and plans for liver biopsy next week. Liver enzymes trending down.  -Appreciate oncology and IR assistance -Anticipate liver biopsy next week -Will consider palliative consult  #Generalized weakness #Hypocalcemia, resolved  Endorses improvement in his overall weakness.  Continues to work with PT/OT.  Calcium has improved.  Still holding Xtandi for his metastatic prostate cancer which could lead to weakness. -Continue working with PT/OT -[facility] for [facility]  #Bilateral lower extremity swelling #Hypoalbuminemia  Hypoalbuminemia and LE swelling could be in the setting of metastatic liver disease along with being multi factorial.  Appetite per patient has been fair/good and he has been taking Ensures throughout the day.  Output documented for yesterday was 450 mL.  Weight is at 73.3 kg down from admission and patient states closer to his baseline weight. -Ensure TID  -strict I&Os  -daily weights  -assess volume status daily   #Hypertension Last BP was 169/95. Plan to resume home Jardiance today.  Amlodipine was stopped prior to admission due to LE edema.  Patient denies any new symptoms or concerns at this time.  -Continue losartan 50 mg daily -Restart home Jardiance  #Hypokalemia Pending a.m. labs still.  Potassium was repleted yesterday.  Last Mg was 1.9.  -Trend BMP, replete potassium as needed  #T2DM CBG improving some with addition of semglee 10 units.  A.m. CBG was 219.  Plan to restart home Jardiance.  -Restart home Jardiance 10 mg daily -continue SSI-M ACHS -continue semglee 10 units daily   Hx of prostate cancer metastatic to bone Holding home Xtandi per discussion with patient's urologist. Checking PSA today.  Will attempt to obtain records from [facility] to help with further evaluation of his metastatic disease. -f/u PSA -Reach out to urology for records   Best Practice: Diet: CM VTE: enoxaparin (LOVENOX) injection  40 mg Start: 06/30/23 1400 Code: Full AB: None Therapy Recs: HH PT DISPO: Anticipated discharge  TBD  to Home pending liver biopsy next week and further workup.  Signature: Rana Snare, DO Internal Medicine Resident PGY-2 Pager: [phone] Please contact the on-call pager after 5 pm and on weekends at [phone].

## 2023-07-02 NOTE — Plan of Care (Signed)

## 2023-07-02 NOTE — Consult Note (Signed)
[id number]             Height:       68.0 in Accession #:    [id number]            Weight:       175.7 lb Date of Birth:  27-Feb-1951              BSA:          1.934 m Patient Age:    72 years              BP:           146/71 mmHg Patient Gender: M                     HR:           71 bpm. Exam Location:  Inpatient Procedure: 2D Echo, Cardiac Doppler and Color Doppler Indications:    R06.9 DOE; R60.0 Lower extremity edema; R53.83 Fatigue  History:        Patient has no prior history of Echocardiogram examinations.                  Signs/Symptoms:Dyspnea, Edema and Fatigue; Risk                 Factors:Hypertension, Diabetes, Former Smoker, History of                 substance abuse. and Dyslipidemia. Patient denies chest pain. He                 does have DOE, leg edema and extreme fatigue since 06/06/2023.  Sonographer:    Carlos American RVT, RDCS (AE), RDMS Referring Phys: [id number] EMILY B MULLEN  Sonographer Comments: Suboptimal apical window. Image acquisition challenging due to respiratory motion. IMPRESSIONS  1. Left ventricular ejection fraction, by estimation, is 60 to 65%. The left ventricle has normal function. The left ventricle has no regional wall motion abnormalities. There is mild concentric left ventricular hypertrophy. Left ventricular diastolic parameters are consistent with Grade I diastolic dysfunction (impaired relaxation).  2. Right ventricular systolic function is normal. The right ventricular size is normal.  3. The mitral valve is normal in structure. Trivial mitral valve regurgitation. No evidence of mitral stenosis.  4. The aortic valve is normal in structure. Aortic valve regurgitation is not visualized. Aortic valve sclerosis/calcification is present, without any evidence of aortic stenosis.  5. The inferior vena cava is normal in size with greater than 50% respiratory variability, suggesting right atrial pressure of 3 mmHg. FINDINGS  Left Ventricle: Left ventricular ejection fraction, by estimation, is 60 to 65%. The left ventricle has normal function. The left ventricle has no regional wall motion abnormalities. The left ventricular internal cavity size was normal in size. There is  mild concentric left ventricular hypertrophy. Left ventricular diastolic parameters are consistent with Grade I diastolic dysfunction (impaired relaxation). Right Ventricle: The right ventricular size is normal. No increase in right ventricular wall thickness. Right ventricular systolic function is normal. Left Atrium: Left atrial  size was normal in size. Right Atrium: Right atrial size was normal in size. Pericardium: There is no evidence of pericardial effusion. Mitral Valve: The mitral valve is normal in structure. Trivial mitral valve regurgitation. No evidence of mitral valve stenosis. Tricuspid Valve: The tricuspid valve is normal in structure. Tricuspid valve regurgitation is trivial. No evidence of tricuspid stenosis. Aortic Valve: The aortic valve is normal in structure. Aortic valve regurgitation  pelvic lymph nodes. Reproductive: Fiducial markers are noted within the prostate gland. The prostate gland is otherwise unremarkable. Other: Small fat containing umbilical hernia. Musculoskeletal: No lytic or blastic bone lesions. No acute bone abnormality. Osseous structures are age-appropriate. IMPRESSION: 1. Innumerable hypoenhancing masses throughout the liver, most in keeping with bilobar hepatic metastatic disease or a primary hepatic malignancy such as multifocal hepatocellular carcinoma. Correlation with serum alpha fetoprotein level may be helpful. If indicated, widespread hepatic metastatic disease should be easily amenable to ultrasound-guided tissue sampling for further evaluation. 2. Bilateral adrenal metastases. 3. Mild coronary artery calcification. 4. Nonobstructing right renal calculus. Aortic Atherosclerosis (ICD10-I70.0). Electronically Signed   By: Helyn Numbers M.D.   On: 06/30/2023 23:17   MR BRAIN W WO CONTRAST  Result Date: 06/30/2023 CLINICAL DATA:  Metastatic disease evaluation. EXAM: MRI HEAD WITHOUT AND WITH CONTRAST TECHNIQUE: Multiplanar, multiecho pulse sequences of the brain and surrounding structures were obtained without and with intravenous contrast. CONTRAST:  8mL GADAVIST GADOBUTROL 1 MMOL/ML IV SOLN COMPARISON:  Head CT 02/12/2018. FINDINGS: Brain: No acute infarct  or hemorrhage. No hydrocephalus or extra-axial collection. No foci of abnormal susceptibility. No mass or abnormal enhancement. Vascular: Normal flow voids and vessel enhancement. Skull and upper cervical spine: Normal marrow signal and enhancement. Sinuses/Orbits: Near-complete opacification of the frontal sinuses, maxillary sinuses and right sphenoid sinus. Orbits are unremarkable. Other: None. IMPRESSION: 1. No evidence of intracranial metastatic disease. 2. Near-complete opacification of the frontal sinuses, maxillary sinuses and right sphenoid sinus. Electronically Signed   By: Orvan Falconer M.D.   On: 06/30/2023 21:43   US Abdomen Limited RUQ (LIVER/GB)  Result Date: 06/30/2023 CLINICAL DATA:  Elevated liver enzymes EXAM: ULTRASOUND ABDOMEN LIMITED RIGHT UPPER QUADRANT COMPARISON:  None Available. FINDINGS: Gallbladder: No gallstones or wall thickening visualized. No sonographic Murphy sign noted by sonographer. Common bile duct: Diameter: 0.2 cm Liver: Scattered mostly echogenic nodules are present throughout the liver. These were not present the CT of 10/30/2018. Some have a targetoid appearance as on image 37 of series 1. Appearance suspicious for diffuse hepatic metastatic disease. Portal vein is patent on color Doppler imaging with normal direction of blood flow towards the liver. Other: None. IMPRESSION: 1. Numerous echogenic nodules throughout the liver, suspicious for diffuse hepatic metastatic disease. Consider further workup such as CT of the chest, abdomen, pelvis with contrast for further characterization and to assess for potential primary. PET-CT could also be an alternative imaging choice or adjunct. The patient has a history of prostate cancer but this would be an unusual metastatic pathway for prostate cancer, and a gastrointestinal primary or liver primary would be more common. The possibility of macronodular cirrhosis is not entirely discarded but seems less likely given the  appearance. Electronically Signed   By: Gaylyn Rong M.D.   On: 06/30/2023 13:45   ECHOCARDIOGRAM COMPLETE  Result Date: 06/30/2023    ECHOCARDIOGRAM REPORT   Patient Name:   William Jones Date of Exam: 06/30/2023 Medical Rec #:  [id number]             Height:       68.0 in Accession #:    [id number]            Weight:       175.7 lb Date of Birth:  [birth year]              BSA:          1.934 m Patient Age:    39 years  pelvic lymph nodes. Reproductive: Fiducial markers are noted within the prostate gland. The prostate gland is otherwise unremarkable. Other: Small fat containing umbilical hernia. Musculoskeletal: No lytic or blastic bone lesions. No acute bone abnormality. Osseous structures are age-appropriate. IMPRESSION: 1. Innumerable hypoenhancing masses throughout the liver, most in keeping with bilobar hepatic metastatic disease or a primary hepatic malignancy such as multifocal hepatocellular carcinoma. Correlation with serum alpha fetoprotein level may be helpful. If indicated, widespread hepatic metastatic disease should be easily amenable to ultrasound-guided tissue sampling for further evaluation. 2. Bilateral adrenal metastases. 3. Mild coronary artery calcification. 4. Nonobstructing right renal calculus. Aortic Atherosclerosis (ICD10-I70.0). Electronically Signed   By: Helyn Numbers M.D.   On: 06/30/2023 23:17   MR BRAIN W WO CONTRAST  Result Date: 06/30/2023 CLINICAL DATA:  Metastatic disease evaluation. EXAM: MRI HEAD WITHOUT AND WITH CONTRAST TECHNIQUE: Multiplanar, multiecho pulse sequences of the brain and surrounding structures were obtained without and with intravenous contrast. CONTRAST:  8mL GADAVIST GADOBUTROL 1 MMOL/ML IV SOLN COMPARISON:  Head CT 02/12/2018. FINDINGS: Brain: No acute infarct  or hemorrhage. No hydrocephalus or extra-axial collection. No foci of abnormal susceptibility. No mass or abnormal enhancement. Vascular: Normal flow voids and vessel enhancement. Skull and upper cervical spine: Normal marrow signal and enhancement. Sinuses/Orbits: Near-complete opacification of the frontal sinuses, maxillary sinuses and right sphenoid sinus. Orbits are unremarkable. Other: None. IMPRESSION: 1. No evidence of intracranial metastatic disease. 2. Near-complete opacification of the frontal sinuses, maxillary sinuses and right sphenoid sinus. Electronically Signed   By: Orvan Falconer M.D.   On: 06/30/2023 21:43   US Abdomen Limited RUQ (LIVER/GB)  Result Date: 06/30/2023 CLINICAL DATA:  Elevated liver enzymes EXAM: ULTRASOUND ABDOMEN LIMITED RIGHT UPPER QUADRANT COMPARISON:  None Available. FINDINGS: Gallbladder: No gallstones or wall thickening visualized. No sonographic Murphy sign noted by sonographer. Common bile duct: Diameter: 0.2 cm Liver: Scattered mostly echogenic nodules are present throughout the liver. These were not present the CT of 10/30/2018. Some have a targetoid appearance as on image 37 of series 1. Appearance suspicious for diffuse hepatic metastatic disease. Portal vein is patent on color Doppler imaging with normal direction of blood flow towards the liver. Other: None. IMPRESSION: 1. Numerous echogenic nodules throughout the liver, suspicious for diffuse hepatic metastatic disease. Consider further workup such as CT of the chest, abdomen, pelvis with contrast for further characterization and to assess for potential primary. PET-CT could also be an alternative imaging choice or adjunct. The patient has a history of prostate cancer but this would be an unusual metastatic pathway for prostate cancer, and a gastrointestinal primary or liver primary would be more common. The possibility of macronodular cirrhosis is not entirely discarded but seems less likely given the  appearance. Electronically Signed   By: Gaylyn Rong M.D.   On: 06/30/2023 13:45   ECHOCARDIOGRAM COMPLETE  Result Date: 06/30/2023    ECHOCARDIOGRAM REPORT   Patient Name:   William Jones Date of Exam: 06/30/2023 Medical Rec #:  [id number]             Height:       68.0 in Accession #:    [id number]            Weight:       175.7 lb Date of Birth:  [birth year]              BSA:          1.934 m Patient Age:    39 years  pelvic lymph nodes. Reproductive: Fiducial markers are noted within the prostate gland. The prostate gland is otherwise unremarkable. Other: Small fat containing umbilical hernia. Musculoskeletal: No lytic or blastic bone lesions. No acute bone abnormality. Osseous structures are age-appropriate. IMPRESSION: 1. Innumerable hypoenhancing masses throughout the liver, most in keeping with bilobar hepatic metastatic disease or a primary hepatic malignancy such as multifocal hepatocellular carcinoma. Correlation with serum alpha fetoprotein level may be helpful. If indicated, widespread hepatic metastatic disease should be easily amenable to ultrasound-guided tissue sampling for further evaluation. 2. Bilateral adrenal metastases. 3. Mild coronary artery calcification. 4. Nonobstructing right renal calculus. Aortic Atherosclerosis (ICD10-I70.0). Electronically Signed   By: Helyn Numbers M.D.   On: 06/30/2023 23:17   MR BRAIN W WO CONTRAST  Result Date: 06/30/2023 CLINICAL DATA:  Metastatic disease evaluation. EXAM: MRI HEAD WITHOUT AND WITH CONTRAST TECHNIQUE: Multiplanar, multiecho pulse sequences of the brain and surrounding structures were obtained without and with intravenous contrast. CONTRAST:  8mL GADAVIST GADOBUTROL 1 MMOL/ML IV SOLN COMPARISON:  Head CT 02/12/2018. FINDINGS: Brain: No acute infarct  or hemorrhage. No hydrocephalus or extra-axial collection. No foci of abnormal susceptibility. No mass or abnormal enhancement. Vascular: Normal flow voids and vessel enhancement. Skull and upper cervical spine: Normal marrow signal and enhancement. Sinuses/Orbits: Near-complete opacification of the frontal sinuses, maxillary sinuses and right sphenoid sinus. Orbits are unremarkable. Other: None. IMPRESSION: 1. No evidence of intracranial metastatic disease. 2. Near-complete opacification of the frontal sinuses, maxillary sinuses and right sphenoid sinus. Electronically Signed   By: Orvan Falconer M.D.   On: 06/30/2023 21:43   US Abdomen Limited RUQ (LIVER/GB)  Result Date: 06/30/2023 CLINICAL DATA:  Elevated liver enzymes EXAM: ULTRASOUND ABDOMEN LIMITED RIGHT UPPER QUADRANT COMPARISON:  None Available. FINDINGS: Gallbladder: No gallstones or wall thickening visualized. No sonographic Murphy sign noted by sonographer. Common bile duct: Diameter: 0.2 cm Liver: Scattered mostly echogenic nodules are present throughout the liver. These were not present the CT of 10/30/2018. Some have a targetoid appearance as on image 37 of series 1. Appearance suspicious for diffuse hepatic metastatic disease. Portal vein is patent on color Doppler imaging with normal direction of blood flow towards the liver. Other: None. IMPRESSION: 1. Numerous echogenic nodules throughout the liver, suspicious for diffuse hepatic metastatic disease. Consider further workup such as CT of the chest, abdomen, pelvis with contrast for further characterization and to assess for potential primary. PET-CT could also be an alternative imaging choice or adjunct. The patient has a history of prostate cancer but this would be an unusual metastatic pathway for prostate cancer, and a gastrointestinal primary or liver primary would be more common. The possibility of macronodular cirrhosis is not entirely discarded but seems less likely given the  appearance. Electronically Signed   By: Gaylyn Rong M.D.   On: 06/30/2023 13:45   ECHOCARDIOGRAM COMPLETE  Result Date: 06/30/2023    ECHOCARDIOGRAM REPORT   Patient Name:   William Jones Date of Exam: 06/30/2023 Medical Rec #:  [id number]             Height:       68.0 in Accession #:    [id number]            Weight:       175.7 lb Date of Birth:  [birth year]              BSA:          1.934 m Patient Age:    39 years  pelvic lymph nodes. Reproductive: Fiducial markers are noted within the prostate gland. The prostate gland is otherwise unremarkable. Other: Small fat containing umbilical hernia. Musculoskeletal: No lytic or blastic bone lesions. No acute bone abnormality. Osseous structures are age-appropriate. IMPRESSION: 1. Innumerable hypoenhancing masses throughout the liver, most in keeping with bilobar hepatic metastatic disease or a primary hepatic malignancy such as multifocal hepatocellular carcinoma. Correlation with serum alpha fetoprotein level may be helpful. If indicated, widespread hepatic metastatic disease should be easily amenable to ultrasound-guided tissue sampling for further evaluation. 2. Bilateral adrenal metastases. 3. Mild coronary artery calcification. 4. Nonobstructing right renal calculus. Aortic Atherosclerosis (ICD10-I70.0). Electronically Signed   By: Helyn Numbers M.D.   On: 06/30/2023 23:17   MR BRAIN W WO CONTRAST  Result Date: 06/30/2023 CLINICAL DATA:  Metastatic disease evaluation. EXAM: MRI HEAD WITHOUT AND WITH CONTRAST TECHNIQUE: Multiplanar, multiecho pulse sequences of the brain and surrounding structures were obtained without and with intravenous contrast. CONTRAST:  8mL GADAVIST GADOBUTROL 1 MMOL/ML IV SOLN COMPARISON:  Head CT 02/12/2018. FINDINGS: Brain: No acute infarct  or hemorrhage. No hydrocephalus or extra-axial collection. No foci of abnormal susceptibility. No mass or abnormal enhancement. Vascular: Normal flow voids and vessel enhancement. Skull and upper cervical spine: Normal marrow signal and enhancement. Sinuses/Orbits: Near-complete opacification of the frontal sinuses, maxillary sinuses and right sphenoid sinus. Orbits are unremarkable. Other: None. IMPRESSION: 1. No evidence of intracranial metastatic disease. 2. Near-complete opacification of the frontal sinuses, maxillary sinuses and right sphenoid sinus. Electronically Signed   By: Orvan Falconer M.D.   On: 06/30/2023 21:43   US Abdomen Limited RUQ (LIVER/GB)  Result Date: 06/30/2023 CLINICAL DATA:  Elevated liver enzymes EXAM: ULTRASOUND ABDOMEN LIMITED RIGHT UPPER QUADRANT COMPARISON:  None Available. FINDINGS: Gallbladder: No gallstones or wall thickening visualized. No sonographic Murphy sign noted by sonographer. Common bile duct: Diameter: 0.2 cm Liver: Scattered mostly echogenic nodules are present throughout the liver. These were not present the CT of 10/30/2018. Some have a targetoid appearance as on image 37 of series 1. Appearance suspicious for diffuse hepatic metastatic disease. Portal vein is patent on color Doppler imaging with normal direction of blood flow towards the liver. Other: None. IMPRESSION: 1. Numerous echogenic nodules throughout the liver, suspicious for diffuse hepatic metastatic disease. Consider further workup such as CT of the chest, abdomen, pelvis with contrast for further characterization and to assess for potential primary. PET-CT could also be an alternative imaging choice or adjunct. The patient has a history of prostate cancer but this would be an unusual metastatic pathway for prostate cancer, and a gastrointestinal primary or liver primary would be more common. The possibility of macronodular cirrhosis is not entirely discarded but seems less likely given the  appearance. Electronically Signed   By: Gaylyn Rong M.D.   On: 06/30/2023 13:45   ECHOCARDIOGRAM COMPLETE  Result Date: 06/30/2023    ECHOCARDIOGRAM REPORT   Patient Name:   William Jones Date of Exam: 06/30/2023 Medical Rec #:  [id number]             Height:       68.0 in Accession #:    [id number]            Weight:       175.7 lb Date of Birth:  [birth year]              BSA:          1.934 m Patient Age:    39 years  [id number]             Height:       68.0 in Accession #:    [id number]            Weight:       175.7 lb Date of Birth:  27-Feb-1951              BSA:          1.934 m Patient Age:    72 years              BP:           146/71 mmHg Patient Gender: M                     HR:           71 bpm. Exam Location:  Inpatient Procedure: 2D Echo, Cardiac Doppler and Color Doppler Indications:    R06.9 DOE; R60.0 Lower extremity edema; R53.83 Fatigue  History:        Patient has no prior history of Echocardiogram examinations.                  Signs/Symptoms:Dyspnea, Edema and Fatigue; Risk                 Factors:Hypertension, Diabetes, Former Smoker, History of                 substance abuse. and Dyslipidemia. Patient denies chest pain. He                 does have DOE, leg edema and extreme fatigue since 06/06/2023.  Sonographer:    Carlos American RVT, RDCS (AE), RDMS Referring Phys: [id number] EMILY B MULLEN  Sonographer Comments: Suboptimal apical window. Image acquisition challenging due to respiratory motion. IMPRESSIONS  1. Left ventricular ejection fraction, by estimation, is 60 to 65%. The left ventricle has normal function. The left ventricle has no regional wall motion abnormalities. There is mild concentric left ventricular hypertrophy. Left ventricular diastolic parameters are consistent with Grade I diastolic dysfunction (impaired relaxation).  2. Right ventricular systolic function is normal. The right ventricular size is normal.  3. The mitral valve is normal in structure. Trivial mitral valve regurgitation. No evidence of mitral stenosis.  4. The aortic valve is normal in structure. Aortic valve regurgitation is not visualized. Aortic valve sclerosis/calcification is present, without any evidence of aortic stenosis.  5. The inferior vena cava is normal in size with greater than 50% respiratory variability, suggesting right atrial pressure of 3 mmHg. FINDINGS  Left Ventricle: Left ventricular ejection fraction, by estimation, is 60 to 65%. The left ventricle has normal function. The left ventricle has no regional wall motion abnormalities. The left ventricular internal cavity size was normal in size. There is  mild concentric left ventricular hypertrophy. Left ventricular diastolic parameters are consistent with Grade I diastolic dysfunction (impaired relaxation). Right Ventricle: The right ventricular size is normal. No increase in right ventricular wall thickness. Right ventricular systolic function is normal. Left Atrium: Left atrial  size was normal in size. Right Atrium: Right atrial size was normal in size. Pericardium: There is no evidence of pericardial effusion. Mitral Valve: The mitral valve is normal in structure. Trivial mitral valve regurgitation. No evidence of mitral valve stenosis. Tricuspid Valve: The tricuspid valve is normal in structure. Tricuspid valve regurgitation is trivial. No evidence of tricuspid stenosis. Aortic Valve: The aortic valve is normal in structure. Aortic valve regurgitation  [id number]             Height:       68.0 in Accession #:    [id number]            Weight:       175.7 lb Date of Birth:  27-Feb-1951              BSA:          1.934 m Patient Age:    72 years              BP:           146/71 mmHg Patient Gender: M                     HR:           71 bpm. Exam Location:  Inpatient Procedure: 2D Echo, Cardiac Doppler and Color Doppler Indications:    R06.9 DOE; R60.0 Lower extremity edema; R53.83 Fatigue  History:        Patient has no prior history of Echocardiogram examinations.                  Signs/Symptoms:Dyspnea, Edema and Fatigue; Risk                 Factors:Hypertension, Diabetes, Former Smoker, History of                 substance abuse. and Dyslipidemia. Patient denies chest pain. He                 does have DOE, leg edema and extreme fatigue since 06/06/2023.  Sonographer:    Carlos American RVT, RDCS (AE), RDMS Referring Phys: [id number] EMILY B MULLEN  Sonographer Comments: Suboptimal apical window. Image acquisition challenging due to respiratory motion. IMPRESSIONS  1. Left ventricular ejection fraction, by estimation, is 60 to 65%. The left ventricle has normal function. The left ventricle has no regional wall motion abnormalities. There is mild concentric left ventricular hypertrophy. Left ventricular diastolic parameters are consistent with Grade I diastolic dysfunction (impaired relaxation).  2. Right ventricular systolic function is normal. The right ventricular size is normal.  3. The mitral valve is normal in structure. Trivial mitral valve regurgitation. No evidence of mitral stenosis.  4. The aortic valve is normal in structure. Aortic valve regurgitation is not visualized. Aortic valve sclerosis/calcification is present, without any evidence of aortic stenosis.  5. The inferior vena cava is normal in size with greater than 50% respiratory variability, suggesting right atrial pressure of 3 mmHg. FINDINGS  Left Ventricle: Left ventricular ejection fraction, by estimation, is 60 to 65%. The left ventricle has normal function. The left ventricle has no regional wall motion abnormalities. The left ventricular internal cavity size was normal in size. There is  mild concentric left ventricular hypertrophy. Left ventricular diastolic parameters are consistent with Grade I diastolic dysfunction (impaired relaxation). Right Ventricle: The right ventricular size is normal. No increase in right ventricular wall thickness. Right ventricular systolic function is normal. Left Atrium: Left atrial  size was normal in size. Right Atrium: Right atrial size was normal in size. Pericardium: There is no evidence of pericardial effusion. Mitral Valve: The mitral valve is normal in structure. Trivial mitral valve regurgitation. No evidence of mitral valve stenosis. Tricuspid Valve: The tricuspid valve is normal in structure. Tricuspid valve regurgitation is trivial. No evidence of tricuspid stenosis. Aortic Valve: The aortic valve is normal in structure. Aortic valve regurgitation  [id number]             Height:       68.0 in Accession #:    [id number]            Weight:       175.7 lb Date of Birth:  27-Feb-1951              BSA:          1.934 m Patient Age:    72 years              BP:           146/71 mmHg Patient Gender: M                     HR:           71 bpm. Exam Location:  Inpatient Procedure: 2D Echo, Cardiac Doppler and Color Doppler Indications:    R06.9 DOE; R60.0 Lower extremity edema; R53.83 Fatigue  History:        Patient has no prior history of Echocardiogram examinations.                  Signs/Symptoms:Dyspnea, Edema and Fatigue; Risk                 Factors:Hypertension, Diabetes, Former Smoker, History of                 substance abuse. and Dyslipidemia. Patient denies chest pain. He                 does have DOE, leg edema and extreme fatigue since 06/06/2023.  Sonographer:    Carlos American RVT, RDCS (AE), RDMS Referring Phys: [id number] EMILY B MULLEN  Sonographer Comments: Suboptimal apical window. Image acquisition challenging due to respiratory motion. IMPRESSIONS  1. Left ventricular ejection fraction, by estimation, is 60 to 65%. The left ventricle has normal function. The left ventricle has no regional wall motion abnormalities. There is mild concentric left ventricular hypertrophy. Left ventricular diastolic parameters are consistent with Grade I diastolic dysfunction (impaired relaxation).  2. Right ventricular systolic function is normal. The right ventricular size is normal.  3. The mitral valve is normal in structure. Trivial mitral valve regurgitation. No evidence of mitral stenosis.  4. The aortic valve is normal in structure. Aortic valve regurgitation is not visualized. Aortic valve sclerosis/calcification is present, without any evidence of aortic stenosis.  5. The inferior vena cava is normal in size with greater than 50% respiratory variability, suggesting right atrial pressure of 3 mmHg. FINDINGS  Left Ventricle: Left ventricular ejection fraction, by estimation, is 60 to 65%. The left ventricle has normal function. The left ventricle has no regional wall motion abnormalities. The left ventricular internal cavity size was normal in size. There is  mild concentric left ventricular hypertrophy. Left ventricular diastolic parameters are consistent with Grade I diastolic dysfunction (impaired relaxation). Right Ventricle: The right ventricular size is normal. No increase in right ventricular wall thickness. Right ventricular systolic function is normal. Left Atrium: Left atrial  size was normal in size. Right Atrium: Right atrial size was normal in size. Pericardium: There is no evidence of pericardial effusion. Mitral Valve: The mitral valve is normal in structure. Trivial mitral valve regurgitation. No evidence of mitral valve stenosis. Tricuspid Valve: The tricuspid valve is normal in structure. Tricuspid valve regurgitation is trivial. No evidence of tricuspid stenosis. Aortic Valve: The aortic valve is normal in structure. Aortic valve regurgitation  pelvic lymph nodes. Reproductive: Fiducial markers are noted within the prostate gland. The prostate gland is otherwise unremarkable. Other: Small fat containing umbilical hernia. Musculoskeletal: No lytic or blastic bone lesions. No acute bone abnormality. Osseous structures are age-appropriate. IMPRESSION: 1. Innumerable hypoenhancing masses throughout the liver, most in keeping with bilobar hepatic metastatic disease or a primary hepatic malignancy such as multifocal hepatocellular carcinoma. Correlation with serum alpha fetoprotein level may be helpful. If indicated, widespread hepatic metastatic disease should be easily amenable to ultrasound-guided tissue sampling for further evaluation. 2. Bilateral adrenal metastases. 3. Mild coronary artery calcification. 4. Nonobstructing right renal calculus. Aortic Atherosclerosis (ICD10-I70.0). Electronically Signed   By: Helyn Numbers M.D.   On: 06/30/2023 23:17   MR BRAIN W WO CONTRAST  Result Date: 06/30/2023 CLINICAL DATA:  Metastatic disease evaluation. EXAM: MRI HEAD WITHOUT AND WITH CONTRAST TECHNIQUE: Multiplanar, multiecho pulse sequences of the brain and surrounding structures were obtained without and with intravenous contrast. CONTRAST:  8mL GADAVIST GADOBUTROL 1 MMOL/ML IV SOLN COMPARISON:  Head CT 02/12/2018. FINDINGS: Brain: No acute infarct  or hemorrhage. No hydrocephalus or extra-axial collection. No foci of abnormal susceptibility. No mass or abnormal enhancement. Vascular: Normal flow voids and vessel enhancement. Skull and upper cervical spine: Normal marrow signal and enhancement. Sinuses/Orbits: Near-complete opacification of the frontal sinuses, maxillary sinuses and right sphenoid sinus. Orbits are unremarkable. Other: None. IMPRESSION: 1. No evidence of intracranial metastatic disease. 2. Near-complete opacification of the frontal sinuses, maxillary sinuses and right sphenoid sinus. Electronically Signed   By: Orvan Falconer M.D.   On: 06/30/2023 21:43   US Abdomen Limited RUQ (LIVER/GB)  Result Date: 06/30/2023 CLINICAL DATA:  Elevated liver enzymes EXAM: ULTRASOUND ABDOMEN LIMITED RIGHT UPPER QUADRANT COMPARISON:  None Available. FINDINGS: Gallbladder: No gallstones or wall thickening visualized. No sonographic Murphy sign noted by sonographer. Common bile duct: Diameter: 0.2 cm Liver: Scattered mostly echogenic nodules are present throughout the liver. These were not present the CT of 10/30/2018. Some have a targetoid appearance as on image 37 of series 1. Appearance suspicious for diffuse hepatic metastatic disease. Portal vein is patent on color Doppler imaging with normal direction of blood flow towards the liver. Other: None. IMPRESSION: 1. Numerous echogenic nodules throughout the liver, suspicious for diffuse hepatic metastatic disease. Consider further workup such as CT of the chest, abdomen, pelvis with contrast for further characterization and to assess for potential primary. PET-CT could also be an alternative imaging choice or adjunct. The patient has a history of prostate cancer but this would be an unusual metastatic pathway for prostate cancer, and a gastrointestinal primary or liver primary would be more common. The possibility of macronodular cirrhosis is not entirely discarded but seems less likely given the  appearance. Electronically Signed   By: Gaylyn Rong M.D.   On: 06/30/2023 13:45   ECHOCARDIOGRAM COMPLETE  Result Date: 06/30/2023    ECHOCARDIOGRAM REPORT   Patient Name:   William Jones Date of Exam: 06/30/2023 Medical Rec #:  [id number]             Height:       68.0 in Accession #:    [id number]            Weight:       175.7 lb Date of Birth:  [birth year]              BSA:          1.934 m Patient Age:    39 years  [id number]             Height:       68.0 in Accession #:    [id number]            Weight:       175.7 lb Date of Birth:  27-Feb-1951              BSA:          1.934 m Patient Age:    72 years              BP:           146/71 mmHg Patient Gender: M                     HR:           71 bpm. Exam Location:  Inpatient Procedure: 2D Echo, Cardiac Doppler and Color Doppler Indications:    R06.9 DOE; R60.0 Lower extremity edema; R53.83 Fatigue  History:        Patient has no prior history of Echocardiogram examinations.                  Signs/Symptoms:Dyspnea, Edema and Fatigue; Risk                 Factors:Hypertension, Diabetes, Former Smoker, History of                 substance abuse. and Dyslipidemia. Patient denies chest pain. He                 does have DOE, leg edema and extreme fatigue since 06/06/2023.  Sonographer:    Carlos American RVT, RDCS (AE), RDMS Referring Phys: [id number] EMILY B MULLEN  Sonographer Comments: Suboptimal apical window. Image acquisition challenging due to respiratory motion. IMPRESSIONS  1. Left ventricular ejection fraction, by estimation, is 60 to 65%. The left ventricle has normal function. The left ventricle has no regional wall motion abnormalities. There is mild concentric left ventricular hypertrophy. Left ventricular diastolic parameters are consistent with Grade I diastolic dysfunction (impaired relaxation).  2. Right ventricular systolic function is normal. The right ventricular size is normal.  3. The mitral valve is normal in structure. Trivial mitral valve regurgitation. No evidence of mitral stenosis.  4. The aortic valve is normal in structure. Aortic valve regurgitation is not visualized. Aortic valve sclerosis/calcification is present, without any evidence of aortic stenosis.  5. The inferior vena cava is normal in size with greater than 50% respiratory variability, suggesting right atrial pressure of 3 mmHg. FINDINGS  Left Ventricle: Left ventricular ejection fraction, by estimation, is 60 to 65%. The left ventricle has normal function. The left ventricle has no regional wall motion abnormalities. The left ventricular internal cavity size was normal in size. There is  mild concentric left ventricular hypertrophy. Left ventricular diastolic parameters are consistent with Grade I diastolic dysfunction (impaired relaxation). Right Ventricle: The right ventricular size is normal. No increase in right ventricular wall thickness. Right ventricular systolic function is normal. Left Atrium: Left atrial  size was normal in size. Right Atrium: Right atrial size was normal in size. Pericardium: There is no evidence of pericardial effusion. Mitral Valve: The mitral valve is normal in structure. Trivial mitral valve regurgitation. No evidence of mitral valve stenosis. Tricuspid Valve: The tricuspid valve is normal in structure. Tricuspid valve regurgitation is trivial. No evidence of tricuspid stenosis. Aortic Valve: The aortic valve is normal in structure. Aortic valve regurgitation  [id number]             Height:       68.0 in Accession #:    [id number]            Weight:       175.7 lb Date of Birth:  27-Feb-1951              BSA:          1.934 m Patient Age:    72 years              BP:           146/71 mmHg Patient Gender: M                     HR:           71 bpm. Exam Location:  Inpatient Procedure: 2D Echo, Cardiac Doppler and Color Doppler Indications:    R06.9 DOE; R60.0 Lower extremity edema; R53.83 Fatigue  History:        Patient has no prior history of Echocardiogram examinations.                  Signs/Symptoms:Dyspnea, Edema and Fatigue; Risk                 Factors:Hypertension, Diabetes, Former Smoker, History of                 substance abuse. and Dyslipidemia. Patient denies chest pain. He                 does have DOE, leg edema and extreme fatigue since 06/06/2023.  Sonographer:    Carlos American RVT, RDCS (AE), RDMS Referring Phys: [id number] EMILY B MULLEN  Sonographer Comments: Suboptimal apical window. Image acquisition challenging due to respiratory motion. IMPRESSIONS  1. Left ventricular ejection fraction, by estimation, is 60 to 65%. The left ventricle has normal function. The left ventricle has no regional wall motion abnormalities. There is mild concentric left ventricular hypertrophy. Left ventricular diastolic parameters are consistent with Grade I diastolic dysfunction (impaired relaxation).  2. Right ventricular systolic function is normal. The right ventricular size is normal.  3. The mitral valve is normal in structure. Trivial mitral valve regurgitation. No evidence of mitral stenosis.  4. The aortic valve is normal in structure. Aortic valve regurgitation is not visualized. Aortic valve sclerosis/calcification is present, without any evidence of aortic stenosis.  5. The inferior vena cava is normal in size with greater than 50% respiratory variability, suggesting right atrial pressure of 3 mmHg. FINDINGS  Left Ventricle: Left ventricular ejection fraction, by estimation, is 60 to 65%. The left ventricle has normal function. The left ventricle has no regional wall motion abnormalities. The left ventricular internal cavity size was normal in size. There is  mild concentric left ventricular hypertrophy. Left ventricular diastolic parameters are consistent with Grade I diastolic dysfunction (impaired relaxation). Right Ventricle: The right ventricular size is normal. No increase in right ventricular wall thickness. Right ventricular systolic function is normal. Left Atrium: Left atrial  size was normal in size. Right Atrium: Right atrial size was normal in size. Pericardium: There is no evidence of pericardial effusion. Mitral Valve: The mitral valve is normal in structure. Trivial mitral valve regurgitation. No evidence of mitral valve stenosis. Tricuspid Valve: The tricuspid valve is normal in structure. Tricuspid valve regurgitation is trivial. No evidence of tricuspid stenosis. Aortic Valve: The aortic valve is normal in structure. Aortic valve regurgitation  pelvic lymph nodes. Reproductive: Fiducial markers are noted within the prostate gland. The prostate gland is otherwise unremarkable. Other: Small fat containing umbilical hernia. Musculoskeletal: No lytic or blastic bone lesions. No acute bone abnormality. Osseous structures are age-appropriate. IMPRESSION: 1. Innumerable hypoenhancing masses throughout the liver, most in keeping with bilobar hepatic metastatic disease or a primary hepatic malignancy such as multifocal hepatocellular carcinoma. Correlation with serum alpha fetoprotein level may be helpful. If indicated, widespread hepatic metastatic disease should be easily amenable to ultrasound-guided tissue sampling for further evaluation. 2. Bilateral adrenal metastases. 3. Mild coronary artery calcification. 4. Nonobstructing right renal calculus. Aortic Atherosclerosis (ICD10-I70.0). Electronically Signed   By: Helyn Numbers M.D.   On: 06/30/2023 23:17   MR BRAIN W WO CONTRAST  Result Date: 06/30/2023 CLINICAL DATA:  Metastatic disease evaluation. EXAM: MRI HEAD WITHOUT AND WITH CONTRAST TECHNIQUE: Multiplanar, multiecho pulse sequences of the brain and surrounding structures were obtained without and with intravenous contrast. CONTRAST:  8mL GADAVIST GADOBUTROL 1 MMOL/ML IV SOLN COMPARISON:  Head CT 02/12/2018. FINDINGS: Brain: No acute infarct  or hemorrhage. No hydrocephalus or extra-axial collection. No foci of abnormal susceptibility. No mass or abnormal enhancement. Vascular: Normal flow voids and vessel enhancement. Skull and upper cervical spine: Normal marrow signal and enhancement. Sinuses/Orbits: Near-complete opacification of the frontal sinuses, maxillary sinuses and right sphenoid sinus. Orbits are unremarkable. Other: None. IMPRESSION: 1. No evidence of intracranial metastatic disease. 2. Near-complete opacification of the frontal sinuses, maxillary sinuses and right sphenoid sinus. Electronically Signed   By: Orvan Falconer M.D.   On: 06/30/2023 21:43   US Abdomen Limited RUQ (LIVER/GB)  Result Date: 06/30/2023 CLINICAL DATA:  Elevated liver enzymes EXAM: ULTRASOUND ABDOMEN LIMITED RIGHT UPPER QUADRANT COMPARISON:  None Available. FINDINGS: Gallbladder: No gallstones or wall thickening visualized. No sonographic Murphy sign noted by sonographer. Common bile duct: Diameter: 0.2 cm Liver: Scattered mostly echogenic nodules are present throughout the liver. These were not present the CT of 10/30/2018. Some have a targetoid appearance as on image 37 of series 1. Appearance suspicious for diffuse hepatic metastatic disease. Portal vein is patent on color Doppler imaging with normal direction of blood flow towards the liver. Other: None. IMPRESSION: 1. Numerous echogenic nodules throughout the liver, suspicious for diffuse hepatic metastatic disease. Consider further workup such as CT of the chest, abdomen, pelvis with contrast for further characterization and to assess for potential primary. PET-CT could also be an alternative imaging choice or adjunct. The patient has a history of prostate cancer but this would be an unusual metastatic pathway for prostate cancer, and a gastrointestinal primary or liver primary would be more common. The possibility of macronodular cirrhosis is not entirely discarded but seems less likely given the  appearance. Electronically Signed   By: Gaylyn Rong M.D.   On: 06/30/2023 13:45   ECHOCARDIOGRAM COMPLETE  Result Date: 06/30/2023    ECHOCARDIOGRAM REPORT   Patient Name:   William Jones Date of Exam: 06/30/2023 Medical Rec #:  [id number]             Height:       68.0 in Accession #:    [id number]            Weight:       175.7 lb Date of Birth:  [birth year]              BSA:          1.934 m Patient Age:    39 years

## 2023-07-03 DIAGNOSIS — K769 Liver disease, unspecified: Secondary | ICD-10-CM | POA: Diagnosis not present

## 2023-07-03 DIAGNOSIS — R531 Weakness: Secondary | ICD-10-CM | POA: Diagnosis not present

## 2023-07-03 LAB — GLUCOSE, CAPILLARY
Glucose-Capillary: 126 mg/dL — ABNORMAL HIGH (ref 70–99)
Glucose-Capillary: 186 mg/dL — ABNORMAL HIGH (ref 70–99)
Glucose-Capillary: 195 mg/dL — ABNORMAL HIGH (ref 70–99)
Glucose-Capillary: 69 mg/dL — ABNORMAL LOW (ref 70–99)

## 2023-07-03 NOTE — Progress Notes (Signed)
HD#3 SUBJECTIVE:  Patient Summary: William Jones is a 72 y.o. 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   Overnight Events: no acute events  Interim History: Patient reports feeling well this morning.  No acute concerns.  States he has been ambulating around the room which has improved from admission.  States his lower extremity edema is improving as well especially able to see wrinkling in his feet.  Patient states he is remaining positive with upcoming liver biopsy.   OBJECTIVE:  Vital Signs: Vitals:   07/02/23 2030 07/02/23 2300 07/03/23 0554 07/03/23 0556  BP: (!) 182/117 (!) 151/89 (!) 197/109 (!) 188/105  Pulse: 98 75 78 84  Resp: 18 18 16    Temp: 98 F (36.7 C)  98 F (36.7 C)   TempSrc: Oral  Oral   SpO2: 100% 100% 100% 100%  Weight:    73.4 kg  Height:       Supplemental O2: Room Air SpO2: 100 %  Filed Weights   07/01/23 0338 07/02/23 0300 07/03/23 0556  Weight: 71.6 kg 73.3 kg 73.4 kg   No intake or output data in the 24 hours ending 07/03/23 0611   Net IO Since Admission: -695 mL [07/03/23 0611]  CBC    Component Value Date/Time   WBC 6.7 07/02/2023 1107   RBC 3.78 (L) 07/02/2023 1107   HGB 11.5 (L) 07/02/2023 1107   HGB 10.6 (L) 06/24/2023 1008   HCT 34.8 (L) 07/02/2023 1107   HCT 33.8 (L) 06/24/2023 1008   PLT 170 07/02/2023 1107   PLT 262 06/24/2023 1008   MCV 92.1 07/02/2023 1107   MCV 95 06/24/2023 1008   MCH 30.4 07/02/2023 1107   MCHC 33.0 07/02/2023 1107   RDW 15.7 (H) 07/02/2023 1107   RDW 14.3 06/24/2023 1008   LYMPHSABS 0.2 (L) 06/29/2023 2216   LYMPHSABS 1.3 04/10/2019 1006   MONOABS 0.6 06/29/2023 2216   EOSABS 0.0 06/29/2023 2216   EOSABS 0.2 04/10/2019 1006   BASOSABS 0.0 06/29/2023 2216   BASOSABS 0.0 04/10/2019 1006   CMP     Component Value  Date/Time   NA 140 07/02/2023 1107   NA 133 (L) 06/24/2023 1008   K 4.1 07/02/2023 1107   CL 105 07/02/2023 1107   CO2 25 07/02/2023 1107   GLUCOSE 232 (H) 07/02/2023 1107   BUN 19 07/02/2023 1107   BUN 26 06/24/2023 1008   CREATININE 0.68 07/02/2023 1107   CALCIUM 8.7 (L) 07/02/2023 1107   PROT 6.6 07/02/2023 1107   PROT 6.3 04/10/2019 1006   ALBUMIN 3.0 (L) 07/02/2023 1107   ALBUMIN 3.9 04/10/2019 1006   AST 74 (H) 07/02/2023 1107   ALT 137 (H) 07/02/2023 1107   ALKPHOS 98 07/02/2023 1107   BILITOT 0.5 07/02/2023 1107   BILITOT <0.2 04/10/2019 1006   EGFR 96 06/24/2023 1008   GFRNONAA >60 07/02/2023 1107    Physical Exam:  Physical Exam Constitutional:      General: He is not in acute distress.    Appearance: Normal appearance.  HENT:     Head: Normocephalic and atraumatic.  Cardiovascular:     Rate and Rhythm: Normal rate and regular rhythm.  Pulmonary:     Effort: Pulmonary effort is normal. No respiratory distress.     Breath sounds: Normal breath sounds.  Abdominal:     General:  Bowel sounds are normal.     Palpations: Abdomen is soft.  Musculoskeletal:     Right lower leg: 1+ Pitting Edema present.     Left lower leg: 1+ Pitting Edema present.     Comments: Improving LE edema bilaterally.  Skin:    General: Skin is warm.  Neurological:     General: No focal deficit present.     Mental Status: He is alert.  Psychiatric:        Mood and Affect: Mood normal.        Behavior: Behavior normal.    ASSESSMENT/PLAN:  Assessment: Principal Problem:   Generalized weakness Active Problems:   Lesion of liver  William Jones is a 72 y.o. 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  Plan: #Innumerable hypoenhancing liver masses, bilobar hepatic metastatic disease vs. primary hepatic  malignancy  #Bilateral adrenal metastases  #Abnormal LFTs Normal AFP.  Plan for IR liver biopsy tomorrow (9/30).  Oncology continues following, will obtain records from his urologist for his metastatic prostate cancer.  Liver enzymes have been elevated but stable. -Appreciate oncology and IR assistance -Anticipate liver biopsy tomorrow (9/30) -Holding Lovenox (VTE prophylaxis) today and tomorrow -N.p.o. at midnight  #Generalized weakness #Hypocalcemia, resolved  Patient endorses improvement in his overall weakness.  He is working with PT and OT.  Still holding Xtandi for his metastatic prostate cancer which can lead to weakness per his urologist. -Continue working with PT/OT -Plan for [facility] PT  #Bilateral lower extremity swelling #Hypoalbuminemia  Hypoalbuminemia and LE swelling could be in the setting of metastatic liver disease along with being multi factorial.  Appetite is improving and taking ensures throughout the day.  No documented output upon chart review.  Weight is stable around 73 kg. -continue Ensure 3 times daily -strict I&Os  -daily weights  -assess volume status daily   #Hypertension Recent BP 169/89. Plan to resume home Jardiance.  Amlodipine was stopped prior to admission due to LE edema.  Patient denies any new symptoms or concerns at this time.  -Continue losartan 50 mg daily -Restart home Jardiance 10 mg daily  #T2DM Fasting glucose today was 180.  Restarting Jardiance today.  -Restart home Jardiance 10 mg daily  -continue SSI-M ACHS -continue semglee 10 units daily   Hx of prostate cancer metastatic to bone PSA was less than 0.01. Holding home [place] per discussion with patient's urologist. Checking PSA today.  Will attempt to obtain records from [facility] to help with further evaluation of his metastatic disease. -Reach out to urology for records -Holding home [place]   Best Practice: Diet: CM (n.p.o. at midnight) VTE: enoxaparin (LOVENOX) injection  40 mg Start: 06/30/23 1400 (HOLD today and tomorrow 9/29-30) Code: Full AB: None Therapy Recs: HH PT DISPO: Anticipated discharge  2-3 days  to Home pending liver biopsy this week and further workup.  Signature: Rana Snare, DO Internal Medicine Resident PGY-2 Pager: [phone] Please contact the on-call pager after 5 pm and on weekends at [phone].

## 2023-07-03 NOTE — Progress Notes (Signed)
MEWS Progress Note  Patient Details Name: William Jones MRN: [id number] DOB: 07/11/1951 Today's Date: 07/03/2023   MEWS Flowsheet Documentation:  Assess: MEWS Score Temp: 97.7 F (36.5 C) BP: (!) 169/89 MAP (mmHg): 114 Pulse Rate: 66 ECG Heart Rate: 92 Resp: 16 Level of Consciousness: Alert SpO2: 100 % O2 Device: Room Air Patient Activity (if Appropriate): In bed Assess: MEWS Score MEWS Temp: 0 MEWS Systolic: 0 MEWS Pulse: 0 MEWS RR: 0 MEWS LOC: 0 MEWS Score: 0 MEWS Score Color: Green Assess: SIRS CRITERIA SIRS Temperature : 0 SIRS Respirations : 0 SIRS Pulse: 0 SIRS WBC: 0 SIRS Score Sum : 0          Santiago Bumpers 07/03/2023, 1:52 PM

## 2023-07-04 ENCOUNTER — Inpatient Hospital Stay (HOSPITAL_COMMUNITY): Payer: 59

## 2023-07-04 DIAGNOSIS — C7970 Secondary malignant neoplasm of unspecified adrenal gland: Secondary | ICD-10-CM | POA: Diagnosis not present

## 2023-07-04 DIAGNOSIS — R2243 Localized swelling, mass and lump, lower limb, bilateral: Secondary | ICD-10-CM | POA: Diagnosis not present

## 2023-07-04 DIAGNOSIS — E8809 Other disorders of plasma-protein metabolism, not elsewhere classified: Secondary | ICD-10-CM | POA: Diagnosis not present

## 2023-07-04 LAB — COMPREHENSIVE METABOLIC PANEL
ALT: 181 U/L — ABNORMAL HIGH (ref 0–44)
AST: 91 U/L — ABNORMAL HIGH (ref 15–41)
Albumin: 2.8 g/dL — ABNORMAL LOW (ref 3.5–5.0)
Alkaline Phosphatase: 105 U/L (ref 38–126)
Anion gap: 13 (ref 5–15)
BUN: 16 mg/dL (ref 8–23)
CO2: 24 mmol/L (ref 22–32)
Calcium: 7.8 mg/dL — ABNORMAL LOW (ref 8.9–10.3)
Chloride: 102 mmol/L (ref 98–111)
Creatinine, Ser: 0.83 mg/dL (ref 0.61–1.24)
GFR, Estimated: >60 mL/min (ref >60)
Glucose, Bld: 150 mg/dL — ABNORMAL HIGH (ref 70–99)
Potassium: 3.3 mmol/L — ABNORMAL LOW (ref 3.5–5.1)
Sodium: 139 mmol/L (ref 135–145)
Total Bilirubin: 0.6 mg/dL (ref 0.3–1.2)
Total Protein: 5.8 g/dL — ABNORMAL LOW (ref 6.5–8.1)

## 2023-07-04 LAB — CBC
HCT: 33.1 % — ABNORMAL LOW (ref 39.0–52.0)
Hemoglobin: 11 g/dL — ABNORMAL LOW (ref 13.0–17.0)
MCH: 29.8 pg (ref 26.0–34.0)
MCHC: 33.2 g/dL (ref 30.0–36.0)
MCV: 89.7 fL (ref 80.0–100.0)
Platelets: 163 10*3/uL (ref 150–400)
RBC: 3.69 MIL/uL — ABNORMAL LOW (ref 4.22–5.81)
RDW: 15.5 % (ref 11.5–15.5)
WBC: 5 10*3/uL (ref 4.0–10.5)
nRBC: 0 % (ref 0.0–0.2)

## 2023-07-04 LAB — GLUCOSE, CAPILLARY
Glucose-Capillary: 105 mg/dL — ABNORMAL HIGH (ref 70–99)
Glucose-Capillary: 156 mg/dL — ABNORMAL HIGH (ref 70–99)
Glucose-Capillary: 160 mg/dL — ABNORMAL HIGH (ref 70–99)
Glucose-Capillary: 175 mg/dL — ABNORMAL HIGH (ref 70–99)
Glucose-Capillary: 200 mg/dL — ABNORMAL HIGH (ref 70–99)
Glucose-Capillary: 329 mg/dL — ABNORMAL HIGH (ref 70–99)
Glucose-Capillary: 473 mg/dL — ABNORMAL HIGH (ref 70–99)

## 2023-07-04 LAB — MAGNESIUM: Magnesium: 2 mg/dL (ref 1.7–2.4)

## 2023-07-04 LAB — GLUCOSE, RANDOM: Glucose, Bld: 366 mg/dL — ABNORMAL HIGH (ref 70–99)

## 2023-07-04 MED ORDER — FENTANYL CITRATE (PF) 100 MCG/2ML IJ SOLN
INTRAMUSCULAR | Status: AC | PRN
  Administered 2023-07-04: 50 ug via INTRAVENOUS

## 2023-07-04 MED ORDER — HYDROCHLOROTHIAZIDE 12.5 MG PO TABS
12.5000 mg | ORAL_TABLET | Freq: Every day | ORAL | Status: DC
  Administered 2023-07-04 – 2023-07-05 (×2): 12.5 mg via ORAL
  Filled 2023-07-04 (×2): qty 1

## 2023-07-04 MED ORDER — MIDAZOLAM HCL 2 MG/2ML IJ SOLN
INTRAMUSCULAR | Status: AC
  Filled 2023-07-04: qty 2

## 2023-07-04 MED ORDER — GELATIN ABSORBABLE 12-7 MM EX MISC
1.0000 | Freq: Once | CUTANEOUS | Status: DC
  Filled 2023-07-04 (×2): qty 1

## 2023-07-04 MED ORDER — POTASSIUM CHLORIDE CRYS ER 20 MEQ PO TBCR
40.0000 meq | EXTENDED_RELEASE_TABLET | Freq: Once | ORAL | Status: AC
  Administered 2023-07-04: 40 meq via ORAL
  Filled 2023-07-04: qty 2

## 2023-07-04 MED ORDER — MIDAZOLAM HCL 2 MG/2ML IJ SOLN
INTRAMUSCULAR | Status: AC | PRN
Start: 2023-07-04 — End: 2023-07-04
  Administered 2023-07-04: 1 mg via INTRAVENOUS

## 2023-07-04 MED ORDER — ORAL CARE MOUTH RINSE: 15.0000 mL | OROMUCOSAL | Status: DC | PRN

## 2023-07-04 MED ORDER — LIDOCAINE HCL 1 % IJ SOLN
10.0000 mL | Freq: Once | INTRAMUSCULAR | Status: DC
  Filled 2023-07-04: qty 10

## 2023-07-04 MED ORDER — FENTANYL CITRATE (PF) 100 MCG/2ML IJ SOLN
INTRAMUSCULAR | Status: AC
  Filled 2023-07-04: qty 2

## 2023-07-04 NOTE — Procedures (Signed)
Vascular and Interventional Radiology Procedure Note  Patient: William Jones DOB: 05-26-1951 Medical Record Number: [id number] Note Date/Time: 07/04/23 9:43 AM   Performing Physician: Roanna Banning, MD Assistant(s): None  Diagnosis: Hx prostate CA. Liver masses  Procedure: LIVER MASS BIOPSY  Anesthesia: Conscious Sedation Complications: None Estimated Blood Loss: Minimal Specimens: Sent for Pathology  Findings:  Successful Ultrasound-guided biopsy of liver mass. A total of 3 samples were obtained. Hemostasis of the tract was achieved using Gelfoam Slurry Embolization.  Plan: Bed rest for 2 hours.  See detailed procedure note with images in PACS. The patient tolerated the procedure well without incident or complication and was returned to Recovery in stable condition.    Roanna Banning, MD [facility] Radiology   Pager. [phone] Clinic. [phone]

## 2023-07-04 NOTE — Progress Notes (Signed)
Transition of Care [facility]) - Inpatient Brief Assessment   Patient Details  Name: KETAN RENZ MRN: [id number] Date of Birth: 1951/03/19  Transition of Care [facility]) CM/SW Contact:    Janae Bridgeman, RN Phone Number: 07/04/2023, 1:50 PM   Clinical Narrative: CM met with the patient at the bedside to discuss TOC needs for home.  The patient admitted to the hospital for Malignancy of Hepatobiliary system and plans to return home with family tonight after pending biopsy.  Medicare choice for home health services offered to the patient but he did not have a preference.  I called [facility] and requested services for PT.  No other TOC needs at this time and the patient plans to have daughter provide transportation to home tonight via car.   Transition of Care Asessment: Insurance and Status: (P) Insurance coverage has been reviewed Patient has primary care physician: (P) Yes Home environment has been reviewed: (P) Home with girlfriend Prior level of function:: (P) INdependent Prior/Current Home Services: (P) No current home services Social Determinants of Health Reivew: (P) SDOH reviewed interventions complete Readmission risk has been reviewed: (P) Yes Transition of care needs: (P) transition of care needs identified, TOC will continue to follow

## 2023-07-04 NOTE — Plan of Care (Signed)
  Problem: Education: Goal: Ability to describe self-care measures that may prevent or decrease complications (Diabetes Survival Skills Education) will improve Outcome: Progressing   Problem: Coping: Goal: Ability to adjust to condition or change in health will improve Outcome: Progressing   Problem: Fluid Volume: Goal: Ability to maintain a balanced intake and output will improve Outcome: Progressing   

## 2023-07-04 NOTE — Progress Notes (Signed)
Physical Therapy Treatment Patient Details Name: William Jones MRN: [id number] DOB: 02/04/1951 Today's Date: 07/04/2023   History of Present Illness Pt is 72 yo arrived by EMS. Pt has noticed feet swelling and generalized weakness. Pt had high CBG and HTN on arrival. Recent admission on 9/8 for GI bleed. PMH: angiodysplasia of colon with hemorrhage, duodenitis, HTN, glaucoma, hemorrhage of rectum and anus, hx prostate cancer, hyperlipidemia, iron deficiency anemia, malignant tumor of prostate, peripheral neuropathy, rectal mass, thrombocytopenia, DM.    PT Comments  Pt greeted up in chair on arrival and agreeable to session with steady progress towards goals, despite pt c/o of increased fatigue this afternoon vs AM. Pt able to progress gait distance without AD support with no overt LOB noted with CGA provided for safety and cues for increased BLE clearance. Pt declining stair training this session due to fatigue. Educated pt on importance of continued activity as well as appropriate activity progression with pt verbalizing understanding. Pt continues to benefit from skilled PT services to progress toward functional mobility goals.      If plan is discharge home, recommend the following: Help with stairs or ramp for entrance   Can travel by private vehicle        Equipment Recommendations  None recommended by PT    Recommendations for Other Services       Precautions / Restrictions Precautions Precautions: Fall Restrictions Weight Bearing Restrictions: No     Mobility  Bed Mobility               General bed mobility comments: up in room upon arrival    Transfers Overall transfer level: Modified independent Equipment used: None Transfers: Sit to/from Stand Sit to Stand: Modified independent (Device/Increase time)                Ambulation/Gait Ambulation/Gait assistance: Contact guard assist, Supervision Gait Distance (Feet): 265 Feet Assistive device:  None Gait Pattern/deviations: Step-through pattern, Decreased step length - right, Decreased step length - left, Narrow base of support Gait velocity: decr     General Gait Details: low clearance with pt dragging slippers throughout, able to improve clearance with encouragement, noted bil external roation of LEs throughout   Stairs         General stair comments: pt declining stair training due to fatigue   Wheelchair Mobility     Tilt Bed    Modified Rankin (Stroke Patients Only)       Balance Overall balance assessment: Needs assistance Sitting-balance support: Single extremity supported Sitting balance-Leahy Scale: Good     Standing balance support: No upper extremity supported, During functional activity Standing balance-Leahy Scale: Fair Standing balance comment: fair to good-. able to mobilize without AD no LOB this session                            Cognition Arousal: Alert Behavior During Therapy: WFL for tasks assessed/performed Overall Cognitive Status: Within Functional Limits for tasks assessed                                          Exercises      General Comments General comments (skin integrity, edema, etc.): VSS on RA      Pertinent Vitals/Pain Pain Assessment Pain Assessment: No/denies pain    Home Living  Prior Function            PT Goals (current goals can now be found in the care plan section) Acute Rehab PT Goals PT Goal Formulation: With patient Time For Goal Achievement: 07/14/23 Progress towards PT goals: Progressing toward goals    Frequency    Min 1X/week      PT Plan      Co-evaluation              AM-PAC PT "6 Clicks" Mobility   Outcome Measure  Help needed turning from your back to your side while in a flat bed without using bedrails?: None Help needed moving from lying on your back to sitting on the side of a flat bed without using  bedrails?: A Little Help needed moving to and from a bed to a chair (including a wheelchair)?: A Little Help needed standing up from a chair using your arms (e.g., wheelchair or bedside chair)?: A Little Help needed to walk in hospital room?: A Little Help needed climbing 3-5 steps with a railing? : A Little 6 Click Score: 19    End of Session   Activity Tolerance: Patient tolerated treatment well Patient left: with call bell/phone within reach;in chair Nurse Communication: Mobility status PT Visit Diagnosis: Other abnormalities of gait and mobility (R26.89);Unsteadiness on feet (R26.81);Muscle weakness (generalized) (M62.81)     Time: 1610-9604 PT Time Calculation (min) (ACUTE ONLY): 15 min  Charges:    $Gait Training: 8-22 mins PT General Charges $$ ACUTE PT VISIT: 1 Visit                     Eryck Negron R. PTA Acute Rehabilitation Services Office: [phone]   Catalina Antigua 07/04/2023, 4:10 PM

## 2023-07-04 NOTE — Progress Notes (Signed)
Occupational Therapy Treatment Patient Details Name: William Jones MRN: [id number] DOB: 1951/05/20 Today's Date: 07/04/2023   History of present illness Pt is 72 yo arrived by EMS. Pt has noticed feet swelling and generalized weakness. Pt had high CBG and HTN on arrival. Recent admission on 9/8 for GI bleed. PMH: angiodysplasia of colon with hemorrhage, duodenitis, HTN, glaucoma, hemorrhage of rectum and anus, hx prostate cancer, hyperlipidemia, iron deficiency anemia, malignant tumor of prostate, peripheral neuropathy, rectal mass, thrombocytopenia, DM.   OT comments  Patient continues to make steady progress towards goals in skilled OT session. Patient is now at [facility] I for ADLs and ambulating around the room without difficulty. Excellent implementation of energy conservation techniques, edema management techniques, and use of DME over the toilet to promote increased independence. Patient with all OT goals met, with all patient's questions answered. OT will sign off at this time, however continue to promote work with PT as patient has 10 steps to enter and would benefit from increased functional mobility and exercises to improve overall endurance.       If plan is discharge home, recommend the following:  Help with stairs or ramp for entrance   Equipment Recommendations  None recommended by OT    Recommendations for Other Services      Precautions / Restrictions Precautions Precautions: Fall Restrictions Weight Bearing Restrictions: No       Mobility Bed Mobility               General bed mobility comments: up in room upon OT arrival    Transfers Overall transfer level: Modified independent Equipment used: None Transfers: Sit to/from Stand Sit to Stand: Modified independent (Device/Increase time)                 Balance                                           ADL either performed or assessed with clinical judgement   ADL Overall  ADL's : Modified independent                                     Functional mobility during ADLs: Modified independent General ADL Comments: Patient is now at mod I for ADLs and ambulating around the room without difficulty. Excellent implementation of energy conservation techniques, edema management techniques, and use of DME over the toilet to promote increased independence. Patient with all OT goals met, with all patient's questions answered. OT will sign off at this time, however continue to promote work with PT as patient has 10 steps to enter and would benefit from increased functional mobility and exercises to improve overall endurance.    Extremity/Trunk Assessment              Vision       Perception     Praxis      Cognition Arousal: Alert Behavior During Therapy: WFL for tasks assessed/performed Overall Cognitive Status: Within Functional Limits for tasks assessed                                          Exercises      Shoulder Instructions  General Comments VSS    Pertinent Vitals/ Pain       Pain Assessment Pain Assessment: No/denies pain  Home Living                                          Prior Functioning/Environment              Frequency  Min 1X/week        Progress Toward Goals  OT Goals(current goals can now be found in the care plan section)  Progress towards OT goals: Progressing toward goals  Acute Rehab OT Goals Patient Stated Goal: to get my energy back OT Goal Formulation: With patient Time For Goal Achievement: 07/14/23 Potential to Achieve Goals: Good  Plan      Co-evaluation                 AM-PAC OT "6 Clicks" Daily Activity     Outcome Measure   Help from another person eating meals?: None Help from another person taking care of personal grooming?: None Help from another person toileting, which includes using toliet, bedpan, or urinal?: None Help  from another person bathing (including washing, rinsing, drying)?: None Help from another person to put on and taking off regular upper body clothing?: None Help from another person to put on and taking off regular lower body clothing?: None 6 Click Score: 24    End of Session    OT Visit Diagnosis: Other abnormalities of gait and mobility (R26.89)   Activity Tolerance Patient tolerated treatment well   Patient Left in bed (off to radiology with transport)   Nurse Communication Mobility status        Time: 8295-6213 OT Time Calculation (min): 12 min  Charges: OT General Charges $OT Visit: 1 Visit OT Treatments $Self Care/Home Management : 8-22 mins  Pollyann Glen E. [name], OTR/L Acute Rehabilitation Services [phone]   Cherlyn Cushing 07/04/2023, 9:23 AM

## 2023-07-04 NOTE — Progress Notes (Signed)
HD#4 SUBJECTIVE:  Patient Summary: William Jones is a 71 y.o. 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   Overnight Events: NAEO   Interim History: Patient was evaluated at bedside. Lower extremity edema improving. Patient endorses mild weakness but continuing to improve. Denies any SOB, chest pain, N/V, abdominal pain, or any or signs or symptoms.    OBJECTIVE:  Vital Signs: Vitals:   07/04/23 0955 07/04/23 1000 07/04/23 1005 07/04/23 1044  BP: (!) 141/79 (!) 141/88 (!) 146/86 (!) 166/100  Pulse: 81 77 80 81  Resp: 18 12 18 17   Temp:    (!) 97.3 F (36.3 C)  TempSrc:    Oral  SpO2: 100% 100% 100% 100%  Weight:      Height:       Supplemental O2: Room Air SpO2: 100 % O2 Flow Rate (L/min): 2 L/min  Filed Weights   07/01/23 0338 07/02/23 0300 07/03/23 0556  Weight: 71.6 kg 73.3 kg 73.4 kg   No intake or output data in the 24 hours ending 07/04/23 1444   Net IO Since Admission: -695 mL [07/04/23 1444]  CBC    Component Value Date/Time   WBC 5.0 07/04/2023 0731   RBC 3.69 (L) 07/04/2023 0731   HGB 11.0 (L) 07/04/2023 0731   HGB 10.6 (L) 06/24/2023 1008   HCT 33.1 (L) 07/04/2023 0731   HCT 33.8 (L) 06/24/2023 1008   PLT 163 07/04/2023 0731   PLT 262 06/24/2023 1008   MCV 89.7 07/04/2023 0731   MCV 95 06/24/2023 1008   MCH 29.8 07/04/2023 0731   MCHC 33.2 07/04/2023 0731   RDW 15.5 07/04/2023 0731   RDW 14.3 06/24/2023 1008   LYMPHSABS 0.2 (L) 06/29/2023 2216   LYMPHSABS 1.3 04/10/2019 1006   MONOABS 0.6 06/29/2023 2216   EOSABS 0.0 06/29/2023 2216   EOSABS 0.2 04/10/2019 1006   BASOSABS 0.0 06/29/2023 2216   BASOSABS 0.0 04/10/2019 1006   CMP     Component Value Date/Time   NA 139 07/04/2023 0731   NA 133 (L) 06/24/2023 1008   K 3.3 (L) 07/04/2023 0731   CL 102 07/04/2023 0731    CO2 24 07/04/2023 0731   GLUCOSE 150 (H) 07/04/2023 0731   BUN 16 07/04/2023 0731   BUN 26 06/24/2023 1008   CREATININE 0.83 07/04/2023 0731   CALCIUM 7.8 (L) 07/04/2023 0731   PROT 5.8 (L) 07/04/2023 0731   PROT 6.3 04/10/2019 1006   ALBUMIN 2.8 (L) 07/04/2023 0731   ALBUMIN 3.9 04/10/2019 1006   AST 91 (H) 07/04/2023 0731   ALT 181 (H) 07/04/2023 0731   ALKPHOS 105 07/04/2023 0731   BILITOT 0.6 07/04/2023 0731   BILITOT <0.2 04/10/2019 1006   EGFR 96 06/24/2023 1008   GFRNONAA >60 07/04/2023 0731    Physical Exam:  Physical Exam Constitutional:      Appearance: Normal appearance.  HENT:     Head: Normocephalic and atraumatic.  Cardiovascular:     Rate and Rhythm: Normal rate and regular rhythm.  Pulmonary:     Effort: Pulmonary effort is normal.     Breath sounds: Normal breath sounds.  Abdominal:     General: Abdomen is flat. Bowel sounds are normal.  Musculoskeletal:     Right lower leg: 2+ Edema present.     Left lower leg: 2+ Edema present.  Skin:  General: Skin is warm.  Neurological:     General: No focal deficit present.     Mental Status: He is alert and oriented to person, place, and time.    ASSESSMENT/PLAN:  Assessment: Principal Problem:   Generalized weakness Active Problems:   Liver lesion  William Jones is a 72 y.o. 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  Plan: #Innumerable hypoenhancing liver masses, bilobar hepatic metastatic disease vs. primary hepatic malignancy  #Bilateral adrenal metastases  #Abnormal LFTs Patient scheduled for IR biopsy today. Oncology continues to follow and will obtain urologist records regarding metastatic prostate cancer. They also recommended a nuclear medicine bone scan for possible osseous metastasis.  - Resume Lovenox (VTE  prophylaxis) post-operatively. - F/U nuclear medicine bone scan (scheduled for 10/1 in AM) - Appreciate oncology and IR assistance   #Generalized weakness #Hypocalcemia, resolved  Patient notes continued improvement in generalized weakness. He continues to work with PT and OT. Per his urologist, we will continue to hold Xtandi for his metastatic prostate cancer that can exacerbate weakness.  - Continue to hold Xtandi  - Continue working w/ PT/OT  - Plan for HH/PT   #Bilateral lower extremity swelling #Hypoalbuminemia  Hypoalbuminemia and LE swelling likely multifactorial and 2/2 to metastatic liver disease. Appetite continues to improve and patient continue taking Ensures throughout the day. Per chart review, no documented output. Weight remains stable at 73.4 kg (last measured 07/03/23)  - Continue Ensure TID between meals  - Strict I&Os  - Daily weights  - Daily assessment of volume status   #Hypertension Continues to be elevated in 160s/110s. Currently taking 50 mg daily losartan. Noted to have had taken 25 mg hydrochlorothiazide daily in the past but self-discontinued. We will begin 12.5 mg daily hydrochlorothiazide and continue to monitor pressures. Patient continues to deny any new symptoms or concerns at this time.  -Continue losartan 50 mg daily -Begin hydrochlorothiazide 12.5 mg daily  -Continue Jardiance 10 mg daily  #T2DM Fasting glucose today was 160. Patient denies any new signs or symptoms at this time.  -Continue home Jardiance 10 mg daily  -Continue SSI-M ACHS -Continue semglee 10 units daily   #Hx of prostate cancer metastatic to bone PSA was less than 0.01, and we will continue to hold Xtandi per discussion with the patient's urologists. We will also attempt to obtain records from [facility] to further assist and evaluate his metastatic disease.  - Reach out to urology for records - Continue to hold [employer] [facility]: Diet: Carb-modified  VTE: Place  and maintain sequential compression device Start: 07/03/23 1337 enoxaparin (LOVENOX) injection 40 mg Start: 06/30/23 1400 (HOLD today and tomorrow 9/29-30) Code: Full AB: None Therapy Recs: HH PT DISPO: Anticipated discharge in 1 day to Home pending liver biopsy and further workup.  Signature: Morrie Sheldon, MD  Internal Medicine Resident PGY-1 Pager: [phone] Please contact the on-call pager after 5 pm and on weekends at [phone].

## 2023-07-04 NOTE — Care Management Important Message (Signed)
Important Message  Patient Details  Name: William Jones MRN: [id number] Date of Birth: [birth year]   Important Message Given:  Yes - Medicare IM     Dorena Bodo 07/04/2023, 3:09 PM

## 2023-07-05 ENCOUNTER — Inpatient Hospital Stay (HOSPITAL_COMMUNITY): Payer: 59

## 2023-07-05 ENCOUNTER — Other Ambulatory Visit (HOSPITAL_COMMUNITY): Payer: Self-pay

## 2023-07-05 DIAGNOSIS — R2243 Localized swelling, mass and lump, lower limb, bilateral: Secondary | ICD-10-CM | POA: Diagnosis not present

## 2023-07-05 DIAGNOSIS — C7970 Secondary malignant neoplasm of unspecified adrenal gland: Secondary | ICD-10-CM | POA: Diagnosis not present

## 2023-07-05 DIAGNOSIS — E8809 Other disorders of plasma-protein metabolism, not elsewhere classified: Secondary | ICD-10-CM | POA: Diagnosis not present

## 2023-07-05 LAB — COMPREHENSIVE METABOLIC PANEL
ALT: 204 U/L — ABNORMAL HIGH (ref 0–44)
AST: 90 U/L — ABNORMAL HIGH (ref 15–41)
Albumin: 2.9 g/dL — ABNORMAL LOW (ref 3.5–5.0)
Alkaline Phosphatase: 119 U/L (ref 38–126)
Anion gap: 9 (ref 5–15)
BUN: 15 mg/dL (ref 8–23)
CO2: 27 mmol/L (ref 22–32)
Calcium: 8 mg/dL — ABNORMAL LOW (ref 8.9–10.3)
Chloride: 104 mmol/L (ref 98–111)
Creatinine, Ser: 0.65 mg/dL (ref 0.61–1.24)
GFR, Estimated: >60 mL/min (ref >60)
Glucose, Bld: 140 mg/dL — ABNORMAL HIGH (ref 70–99)
Potassium: 4.5 mmol/L (ref 3.5–5.1)
Sodium: 140 mmol/L (ref 135–145)
Total Bilirubin: 0.6 mg/dL (ref 0.3–1.2)
Total Protein: 6.2 g/dL — ABNORMAL LOW (ref 6.5–8.1)

## 2023-07-05 LAB — GLUCOSE, CAPILLARY
Glucose-Capillary: 156 mg/dL — ABNORMAL HIGH (ref 70–99)
Glucose-Capillary: 104 mg/dL — ABNORMAL HIGH (ref 70–99)

## 2023-07-05 MED ORDER — TECHNETIUM TC 99M MEDRONATE IV KIT
20.0000 | PACK | Freq: Once | INTRAVENOUS | Status: AC | PRN
  Administered 2023-07-05: 20 via INTRAVENOUS

## 2023-07-05 MED ORDER — ENSURE ENLIVE PO LIQD
237.0000 mL | Freq: Three times a day (TID) | ORAL | 12 refills | Status: DC
  Filled 2023-07-05: qty 237, 1d supply, fill #0

## 2023-07-05 MED ORDER — HYDROCHLOROTHIAZIDE 12.5 MG PO TABS
12.5000 mg | ORAL_TABLET | Freq: Every day | ORAL | 0 refills | Status: DC
  Filled 2023-07-05: qty 30, 30d supply, fill #0

## 2023-07-05 MED ORDER — LOSARTAN POTASSIUM 50 MG PO TABS
50.0000 mg | ORAL_TABLET | Freq: Every day | ORAL | 0 refills | Status: DC
  Filled 2023-07-05: qty 30, 30d supply, fill #0

## 2023-07-05 NOTE — Discharge Summary (Signed)
Name: William Jones MRN: [id number] DOB: [birth year] 72 y.o. PCP: Morene Crocker, MD  Date of Admission: 06/29/2023  9:49 PM Date of Discharge:  07/05/23 Attending Physician: Dr. Criselda Peaches  DISCHARGE DIAGNOSIS:  Primary Problem: Generalized weakness   Hospital Problems: Principal Problem:   Generalized weakness Active Problems:   Liver lesion    DISCHARGE MEDICATIONS:   Allergies as of 07/05/2023   No Known Allergies      Medication List     STOP taking these medications    amLODipine 10 MG tablet Commonly known as: NORVASC       TAKE these medications    CALCIUM CITRATE PO Take 600 mg by mouth daily.   cholecalciferol 10 MCG (400 UNIT) Tabs tablet Commonly known as: VITAMIN D3 Take 1,000 Units by mouth daily.   dorzolamide 2 % ophthalmic solution Commonly known as: TRUSOPT Place 1 drop into the right eye 2 (two) times daily.   empagliflozin 10 MG Tabs tablet Commonly known as: Jardiance Take 1 tablet (10 mg total) by mouth daily before breakfast.   feeding supplement Liqd Take 237 mLs by mouth 3 (three) times daily between meals.   hydrochlorothiazide 12.5 MG tablet Commonly known as: HYDRODIURIL Take 1 tablet (12.5 mg total) by mouth daily. Start taking on: July 06, 2023   IRON PO Take 1 tablet by mouth daily.   losartan 50 MG tablet Commonly known as: COZAAR Take 1 tablet (50 mg total) by mouth daily. Start taking on: July 06, 2023   One-A-Day Proactive 65+ Tabs Take 1 tablet by mouth daily.   pantoprazole 40 MG tablet Commonly known as: Protonix Take 1 tablet (40 mg total) by mouth daily.   timolol 0.5 % ophthalmic solution Commonly known as: BETIMOL Place 1 drop into the right eye 2 (two) times daily.   vitamin E 180 MG (400 UNITS) capsule Take 400 Units by mouth daily.   Xtandi 40 MG capsule Generic drug: enzalutamide Take 160 mg by mouth at bedtime.        DISPOSITION AND FOLLOW-UP:  Mr.William F  Jones was discharged from [facility] in stable condition. At the hospital follow up visit please address:  Follow-up Recommendations: Consults: Oncology, Urology  Labs: Comprehensive Metabolic Panel Studies: Surgical Pathology IR Liver; NM Bone Scan Whole Body  Medications: Xtandi; hydrochlorothiazide; losartan   Follow-up Appointments:  Follow-up Information     Care, [facility] Follow up.   Specialty: Home Health Services Why: [facility] will be providing home health services.  They will call you in the next 24-48 hours to set up services. Contact information: [address] [place] [address] [phone]         Lovie Macadamia, MD Follow up on 07/21/2023.   Specialty: Internal Medicine Why: Scheduled for 1:15 PM Contact information: [address] [place] [address] [phone]         Rachel Moulds, MD Follow up on 07/11/2023.   Specialty: Hematology and Oncology Why: Scheduled for 2 PM Contact information: [address] [place] [address] [phone]         Loletta Parish., MD. Call.   Specialty: Urology Why: Please reach out to Dr. Berneice Heinrich regarding the [place]. He is aware of the new diagnosis. Contact information: [address] [place] [address] [phone]                 HOSPITAL COURSE:  Patient Summary: #Innumerable hypoenhancing liver  Name: William Jones MRN: [id number] DOB: [birth year] 72 y.o. PCP: Morene Crocker, MD  Date of Admission: 06/29/2023  9:49 PM Date of Discharge:  07/05/23 Attending Physician: Dr. Criselda Peaches  DISCHARGE DIAGNOSIS:  Primary Problem: Generalized weakness   Hospital Problems: Principal Problem:   Generalized weakness Active Problems:   Liver lesion    DISCHARGE MEDICATIONS:   Allergies as of 07/05/2023   No Known Allergies      Medication List     STOP taking these medications    amLODipine 10 MG tablet Commonly known as: NORVASC       TAKE these medications    CALCIUM CITRATE PO Take 600 mg by mouth daily.   cholecalciferol 10 MCG (400 UNIT) Tabs tablet Commonly known as: VITAMIN D3 Take 1,000 Units by mouth daily.   dorzolamide 2 % ophthalmic solution Commonly known as: TRUSOPT Place 1 drop into the right eye 2 (two) times daily.   empagliflozin 10 MG Tabs tablet Commonly known as: Jardiance Take 1 tablet (10 mg total) by mouth daily before breakfast.   feeding supplement Liqd Take 237 mLs by mouth 3 (three) times daily between meals.   hydrochlorothiazide 12.5 MG tablet Commonly known as: HYDRODIURIL Take 1 tablet (12.5 mg total) by mouth daily. Start taking on: July 06, 2023   IRON PO Take 1 tablet by mouth daily.   losartan 50 MG tablet Commonly known as: COZAAR Take 1 tablet (50 mg total) by mouth daily. Start taking on: July 06, 2023   One-A-Day Proactive 65+ Tabs Take 1 tablet by mouth daily.   pantoprazole 40 MG tablet Commonly known as: Protonix Take 1 tablet (40 mg total) by mouth daily.   timolol 0.5 % ophthalmic solution Commonly known as: BETIMOL Place 1 drop into the right eye 2 (two) times daily.   vitamin E 180 MG (400 UNITS) capsule Take 400 Units by mouth daily.   Xtandi 40 MG capsule Generic drug: enzalutamide Take 160 mg by mouth at bedtime.        DISPOSITION AND FOLLOW-UP:  Mr.William F  Jones was discharged from [facility] in stable condition. At the hospital follow up visit please address:  Follow-up Recommendations: Consults: Oncology, Urology  Labs: Comprehensive Metabolic Panel Studies: Surgical Pathology IR Liver; NM Bone Scan Whole Body  Medications: Xtandi; hydrochlorothiazide; losartan   Follow-up Appointments:  Follow-up Information     Care, [facility] Follow up.   Specialty: Home Health Services Why: [facility] will be providing home health services.  They will call you in the next 24-48 hours to set up services. Contact information: [address] [place] [address] [phone]         Lovie Macadamia, MD Follow up on 07/21/2023.   Specialty: Internal Medicine Why: Scheduled for 1:15 PM Contact information: [address] [place] [address] [phone]         Rachel Moulds, MD Follow up on 07/11/2023.   Specialty: Hematology and Oncology Why: Scheduled for 2 PM Contact information: [address] [place] [address] [phone]         Loletta Parish., MD. Call.   Specialty: Urology Why: Please reach out to Dr. Berneice Heinrich regarding the [place]. He is aware of the new diagnosis. Contact information: [address] [place] [address] [phone]                 HOSPITAL COURSE:  Patient Summary: #Innumerable hypoenhancing liver  Name: William Jones MRN: [id number] DOB: [birth year] 72 y.o. PCP: Morene Crocker, MD  Date of Admission: 06/29/2023  9:49 PM Date of Discharge:  07/05/23 Attending Physician: Dr. Criselda Peaches  DISCHARGE DIAGNOSIS:  Primary Problem: Generalized weakness   Hospital Problems: Principal Problem:   Generalized weakness Active Problems:   Liver lesion    DISCHARGE MEDICATIONS:   Allergies as of 07/05/2023   No Known Allergies      Medication List     STOP taking these medications    amLODipine 10 MG tablet Commonly known as: NORVASC       TAKE these medications    CALCIUM CITRATE PO Take 600 mg by mouth daily.   cholecalciferol 10 MCG (400 UNIT) Tabs tablet Commonly known as: VITAMIN D3 Take 1,000 Units by mouth daily.   dorzolamide 2 % ophthalmic solution Commonly known as: TRUSOPT Place 1 drop into the right eye 2 (two) times daily.   empagliflozin 10 MG Tabs tablet Commonly known as: Jardiance Take 1 tablet (10 mg total) by mouth daily before breakfast.   feeding supplement Liqd Take 237 mLs by mouth 3 (three) times daily between meals.   hydrochlorothiazide 12.5 MG tablet Commonly known as: HYDRODIURIL Take 1 tablet (12.5 mg total) by mouth daily. Start taking on: July 06, 2023   IRON PO Take 1 tablet by mouth daily.   losartan 50 MG tablet Commonly known as: COZAAR Take 1 tablet (50 mg total) by mouth daily. Start taking on: July 06, 2023   One-A-Day Proactive 65+ Tabs Take 1 tablet by mouth daily.   pantoprazole 40 MG tablet Commonly known as: Protonix Take 1 tablet (40 mg total) by mouth daily.   timolol 0.5 % ophthalmic solution Commonly known as: BETIMOL Place 1 drop into the right eye 2 (two) times daily.   vitamin E 180 MG (400 UNITS) capsule Take 400 Units by mouth daily.   Xtandi 40 MG capsule Generic drug: enzalutamide Take 160 mg by mouth at bedtime.        DISPOSITION AND FOLLOW-UP:  Mr.William F  Jones was discharged from [facility] in stable condition. At the hospital follow up visit please address:  Follow-up Recommendations: Consults: Oncology, Urology  Labs: Comprehensive Metabolic Panel Studies: Surgical Pathology IR Liver; NM Bone Scan Whole Body  Medications: Xtandi; hydrochlorothiazide; losartan   Follow-up Appointments:  Follow-up Information     Care, [facility] Follow up.   Specialty: Home Health Services Why: [facility] will be providing home health services.  They will call you in the next 24-48 hours to set up services. Contact information: [address] [place] [address] [phone]         Lovie Macadamia, MD Follow up on 07/21/2023.   Specialty: Internal Medicine Why: Scheduled for 1:15 PM Contact information: [address] [place] [address] [phone]         Rachel Moulds, MD Follow up on 07/11/2023.   Specialty: Hematology and Oncology Why: Scheduled for 2 PM Contact information: [address] [place] [address] [phone]         Loletta Parish., MD. Call.   Specialty: Urology Why: Please reach out to Dr. Berneice Heinrich regarding the [place]. He is aware of the new diagnosis. Contact information: [address] [place] [address] [phone]                 HOSPITAL COURSE:  Patient Summary: #Innumerable hypoenhancing liver  Name: William Jones MRN: [id number] DOB: [birth year] 72 y.o. PCP: Morene Crocker, MD  Date of Admission: 06/29/2023  9:49 PM Date of Discharge:  07/05/23 Attending Physician: Dr. Criselda Peaches  DISCHARGE DIAGNOSIS:  Primary Problem: Generalized weakness   Hospital Problems: Principal Problem:   Generalized weakness Active Problems:   Liver lesion    DISCHARGE MEDICATIONS:   Allergies as of 07/05/2023   No Known Allergies      Medication List     STOP taking these medications    amLODipine 10 MG tablet Commonly known as: NORVASC       TAKE these medications    CALCIUM CITRATE PO Take 600 mg by mouth daily.   cholecalciferol 10 MCG (400 UNIT) Tabs tablet Commonly known as: VITAMIN D3 Take 1,000 Units by mouth daily.   dorzolamide 2 % ophthalmic solution Commonly known as: TRUSOPT Place 1 drop into the right eye 2 (two) times daily.   empagliflozin 10 MG Tabs tablet Commonly known as: Jardiance Take 1 tablet (10 mg total) by mouth daily before breakfast.   feeding supplement Liqd Take 237 mLs by mouth 3 (three) times daily between meals.   hydrochlorothiazide 12.5 MG tablet Commonly known as: HYDRODIURIL Take 1 tablet (12.5 mg total) by mouth daily. Start taking on: July 06, 2023   IRON PO Take 1 tablet by mouth daily.   losartan 50 MG tablet Commonly known as: COZAAR Take 1 tablet (50 mg total) by mouth daily. Start taking on: July 06, 2023   One-A-Day Proactive 65+ Tabs Take 1 tablet by mouth daily.   pantoprazole 40 MG tablet Commonly known as: Protonix Take 1 tablet (40 mg total) by mouth daily.   timolol 0.5 % ophthalmic solution Commonly known as: BETIMOL Place 1 drop into the right eye 2 (two) times daily.   vitamin E 180 MG (400 UNITS) capsule Take 400 Units by mouth daily.   Xtandi 40 MG capsule Generic drug: enzalutamide Take 160 mg by mouth at bedtime.        DISPOSITION AND FOLLOW-UP:  Mr.William F  Jones was discharged from [facility] in stable condition. At the hospital follow up visit please address:  Follow-up Recommendations: Consults: Oncology, Urology  Labs: Comprehensive Metabolic Panel Studies: Surgical Pathology IR Liver; NM Bone Scan Whole Body  Medications: Xtandi; hydrochlorothiazide; losartan   Follow-up Appointments:  Follow-up Information     Care, [facility] Follow up.   Specialty: Home Health Services Why: [facility] will be providing home health services.  They will call you in the next 24-48 hours to set up services. Contact information: [address] [place] [address] [phone]         Lovie Macadamia, MD Follow up on 07/21/2023.   Specialty: Internal Medicine Why: Scheduled for 1:15 PM Contact information: [address] [place] [address] [phone]         Rachel Moulds, MD Follow up on 07/11/2023.   Specialty: Hematology and Oncology Why: Scheduled for 2 PM Contact information: [address] [place] [address] [phone]         Loletta Parish., MD. Call.   Specialty: Urology Why: Please reach out to Dr. Berneice Heinrich regarding the [place]. He is aware of the new diagnosis. Contact information: [address] [place] [address] [phone]                 HOSPITAL COURSE:  Patient Summary: #Innumerable hypoenhancing liver  or extra-axial collection. No foci of abnormal susceptibility. No mass or abnormal enhancement. Vascular: Normal flow voids and vessel enhancement. Skull and upper cervical spine: Normal marrow signal and enhancement. Sinuses/Orbits: Near-complete opacification of the frontal sinuses, maxillary sinuses and right sphenoid sinus. Orbits are unremarkable. Other: None.  IMPRESSION: 1. No evidence of intracranial metastatic disease. 2. Near-complete opacification of the frontal sinuses, maxillary sinuses and right sphenoid sinus. Electronically Signed   By: Orvan Falconer M.D.   On: 06/30/2023 21:43   US Abdomen Limited RUQ (LIVER/GB)  Result Date: 06/30/2023 CLINICAL DATA:  Elevated liver enzymes EXAM: ULTRASOUND ABDOMEN LIMITED RIGHT UPPER QUADRANT COMPARISON:  None Available. FINDINGS: Gallbladder: No gallstones or wall thickening visualized. No sonographic Murphy sign noted by sonographer. Common bile duct: Diameter: 0.2 cm Liver: Scattered mostly echogenic nodules are present throughout the liver. These were not present the CT of 10/30/2018. Some have a targetoid appearance as on image 37 of series 1. Appearance suspicious for diffuse hepatic metastatic disease. Portal vein is patent on color Doppler imaging with normal direction of blood flow towards the liver. Other: None. IMPRESSION: 1. Numerous echogenic nodules throughout the liver, suspicious for diffuse hepatic metastatic disease. Consider further workup such as CT of the chest, abdomen, pelvis with contrast for further characterization and to assess for potential primary. PET-CT could also be an alternative imaging choice or adjunct. The patient has a history of prostate cancer but this would be an unusual metastatic pathway for prostate cancer, and a gastrointestinal primary or liver primary would be more common. The possibility of macronodular cirrhosis is not entirely discarded but seems less likely given the appearance. Electronically Signed   By: Gaylyn Rong M.D.   On: 06/30/2023 13:45   ECHOCARDIOGRAM COMPLETE  Result Date: 06/30/2023    ECHOCARDIOGRAM REPORT   Patient Name:   MILDRED BOLLARD Date of Exam: 06/30/2023 Medical Rec #:  [id number]             Height:       68.0 in Accession #:    [id number]            Weight:       175.7 lb Date of Birth:  1951/08/28              BSA:          1.934  m Patient Age:    72 years              BP:           146/71 mmHg Patient Gender: M                     HR:           71 bpm. Exam Location:  Inpatient Procedure: 2D Echo, Cardiac Doppler and Color Doppler Indications:    R06.9 DOE; R60.0 Lower extremity edema; R53.83 Fatigue  History:        Patient has no prior history of Echocardiogram examinations.                 Signs/Symptoms:Dyspnea, Edema and Fatigue; Risk                 Factors:Hypertension, Diabetes, Former Smoker, History of                 substance abuse. and Dyslipidemia. Patient denies chest pain. He                 does have  or extra-axial collection. No foci of abnormal susceptibility. No mass or abnormal enhancement. Vascular: Normal flow voids and vessel enhancement. Skull and upper cervical spine: Normal marrow signal and enhancement. Sinuses/Orbits: Near-complete opacification of the frontal sinuses, maxillary sinuses and right sphenoid sinus. Orbits are unremarkable. Other: None.  IMPRESSION: 1. No evidence of intracranial metastatic disease. 2. Near-complete opacification of the frontal sinuses, maxillary sinuses and right sphenoid sinus. Electronically Signed   By: Orvan Falconer M.D.   On: 06/30/2023 21:43   US Abdomen Limited RUQ (LIVER/GB)  Result Date: 06/30/2023 CLINICAL DATA:  Elevated liver enzymes EXAM: ULTRASOUND ABDOMEN LIMITED RIGHT UPPER QUADRANT COMPARISON:  None Available. FINDINGS: Gallbladder: No gallstones or wall thickening visualized. No sonographic Murphy sign noted by sonographer. Common bile duct: Diameter: 0.2 cm Liver: Scattered mostly echogenic nodules are present throughout the liver. These were not present the CT of 10/30/2018. Some have a targetoid appearance as on image 37 of series 1. Appearance suspicious for diffuse hepatic metastatic disease. Portal vein is patent on color Doppler imaging with normal direction of blood flow towards the liver. Other: None. IMPRESSION: 1. Numerous echogenic nodules throughout the liver, suspicious for diffuse hepatic metastatic disease. Consider further workup such as CT of the chest, abdomen, pelvis with contrast for further characterization and to assess for potential primary. PET-CT could also be an alternative imaging choice or adjunct. The patient has a history of prostate cancer but this would be an unusual metastatic pathway for prostate cancer, and a gastrointestinal primary or liver primary would be more common. The possibility of macronodular cirrhosis is not entirely discarded but seems less likely given the appearance. Electronically Signed   By: Gaylyn Rong M.D.   On: 06/30/2023 13:45   ECHOCARDIOGRAM COMPLETE  Result Date: 06/30/2023    ECHOCARDIOGRAM REPORT   Patient Name:   MILDRED BOLLARD Date of Exam: 06/30/2023 Medical Rec #:  [id number]             Height:       68.0 in Accession #:    [id number]            Weight:       175.7 lb Date of Birth:  1951/08/28              BSA:          1.934  m Patient Age:    72 years              BP:           146/71 mmHg Patient Gender: M                     HR:           71 bpm. Exam Location:  Inpatient Procedure: 2D Echo, Cardiac Doppler and Color Doppler Indications:    R06.9 DOE; R60.0 Lower extremity edema; R53.83 Fatigue  History:        Patient has no prior history of Echocardiogram examinations.                 Signs/Symptoms:Dyspnea, Edema and Fatigue; Risk                 Factors:Hypertension, Diabetes, Former Smoker, History of                 substance abuse. and Dyslipidemia. Patient denies chest pain. He                 does have  or extra-axial collection. No foci of abnormal susceptibility. No mass or abnormal enhancement. Vascular: Normal flow voids and vessel enhancement. Skull and upper cervical spine: Normal marrow signal and enhancement. Sinuses/Orbits: Near-complete opacification of the frontal sinuses, maxillary sinuses and right sphenoid sinus. Orbits are unremarkable. Other: None.  IMPRESSION: 1. No evidence of intracranial metastatic disease. 2. Near-complete opacification of the frontal sinuses, maxillary sinuses and right sphenoid sinus. Electronically Signed   By: Orvan Falconer M.D.   On: 06/30/2023 21:43   US Abdomen Limited RUQ (LIVER/GB)  Result Date: 06/30/2023 CLINICAL DATA:  Elevated liver enzymes EXAM: ULTRASOUND ABDOMEN LIMITED RIGHT UPPER QUADRANT COMPARISON:  None Available. FINDINGS: Gallbladder: No gallstones or wall thickening visualized. No sonographic Murphy sign noted by sonographer. Common bile duct: Diameter: 0.2 cm Liver: Scattered mostly echogenic nodules are present throughout the liver. These were not present the CT of 10/30/2018. Some have a targetoid appearance as on image 37 of series 1. Appearance suspicious for diffuse hepatic metastatic disease. Portal vein is patent on color Doppler imaging with normal direction of blood flow towards the liver. Other: None. IMPRESSION: 1. Numerous echogenic nodules throughout the liver, suspicious for diffuse hepatic metastatic disease. Consider further workup such as CT of the chest, abdomen, pelvis with contrast for further characterization and to assess for potential primary. PET-CT could also be an alternative imaging choice or adjunct. The patient has a history of prostate cancer but this would be an unusual metastatic pathway for prostate cancer, and a gastrointestinal primary or liver primary would be more common. The possibility of macronodular cirrhosis is not entirely discarded but seems less likely given the appearance. Electronically Signed   By: Gaylyn Rong M.D.   On: 06/30/2023 13:45   ECHOCARDIOGRAM COMPLETE  Result Date: 06/30/2023    ECHOCARDIOGRAM REPORT   Patient Name:   MILDRED BOLLARD Date of Exam: 06/30/2023 Medical Rec #:  [id number]             Height:       68.0 in Accession #:    [id number]            Weight:       175.7 lb Date of Birth:  1951/08/28              BSA:          1.934  m Patient Age:    72 years              BP:           146/71 mmHg Patient Gender: M                     HR:           71 bpm. Exam Location:  Inpatient Procedure: 2D Echo, Cardiac Doppler and Color Doppler Indications:    R06.9 DOE; R60.0 Lower extremity edema; R53.83 Fatigue  History:        Patient has no prior history of Echocardiogram examinations.                 Signs/Symptoms:Dyspnea, Edema and Fatigue; Risk                 Factors:Hypertension, Diabetes, Former Smoker, History of                 substance abuse. and Dyslipidemia. Patient denies chest pain. He                 does have  or extra-axial collection. No foci of abnormal susceptibility. No mass or abnormal enhancement. Vascular: Normal flow voids and vessel enhancement. Skull and upper cervical spine: Normal marrow signal and enhancement. Sinuses/Orbits: Near-complete opacification of the frontal sinuses, maxillary sinuses and right sphenoid sinus. Orbits are unremarkable. Other: None.  IMPRESSION: 1. No evidence of intracranial metastatic disease. 2. Near-complete opacification of the frontal sinuses, maxillary sinuses and right sphenoid sinus. Electronically Signed   By: Orvan Falconer M.D.   On: 06/30/2023 21:43   US Abdomen Limited RUQ (LIVER/GB)  Result Date: 06/30/2023 CLINICAL DATA:  Elevated liver enzymes EXAM: ULTRASOUND ABDOMEN LIMITED RIGHT UPPER QUADRANT COMPARISON:  None Available. FINDINGS: Gallbladder: No gallstones or wall thickening visualized. No sonographic Murphy sign noted by sonographer. Common bile duct: Diameter: 0.2 cm Liver: Scattered mostly echogenic nodules are present throughout the liver. These were not present the CT of 10/30/2018. Some have a targetoid appearance as on image 37 of series 1. Appearance suspicious for diffuse hepatic metastatic disease. Portal vein is patent on color Doppler imaging with normal direction of blood flow towards the liver. Other: None. IMPRESSION: 1. Numerous echogenic nodules throughout the liver, suspicious for diffuse hepatic metastatic disease. Consider further workup such as CT of the chest, abdomen, pelvis with contrast for further characterization and to assess for potential primary. PET-CT could also be an alternative imaging choice or adjunct. The patient has a history of prostate cancer but this would be an unusual metastatic pathway for prostate cancer, and a gastrointestinal primary or liver primary would be more common. The possibility of macronodular cirrhosis is not entirely discarded but seems less likely given the appearance. Electronically Signed   By: Gaylyn Rong M.D.   On: 06/30/2023 13:45   ECHOCARDIOGRAM COMPLETE  Result Date: 06/30/2023    ECHOCARDIOGRAM REPORT   Patient Name:   MILDRED BOLLARD Date of Exam: 06/30/2023 Medical Rec #:  [id number]             Height:       68.0 in Accession #:    [id number]            Weight:       175.7 lb Date of Birth:  1951/08/28              BSA:          1.934  m Patient Age:    72 years              BP:           146/71 mmHg Patient Gender: M                     HR:           71 bpm. Exam Location:  Inpatient Procedure: 2D Echo, Cardiac Doppler and Color Doppler Indications:    R06.9 DOE; R60.0 Lower extremity edema; R53.83 Fatigue  History:        Patient has no prior history of Echocardiogram examinations.                 Signs/Symptoms:Dyspnea, Edema and Fatigue; Risk                 Factors:Hypertension, Diabetes, Former Smoker, History of                 substance abuse. and Dyslipidemia. Patient denies chest pain. He                 does have

## 2023-07-05 NOTE — Plan of Care (Signed)

## 2023-07-05 NOTE — Progress Notes (Signed)
Physical Therapy Treatment Patient Details Name: William Jones MRN: [id number] DOB: 04-08-1951 Today's Date: 07/05/2023   History of Present Illness Pt is 72 yo arrived by EMS. Pt has noticed feet swelling and generalized weakness. Pt had high CBG and HTN on arrival. Recent admission on 9/8 for GI bleed. PMH: angiodysplasia of colon with hemorrhage, duodenitis, HTN, glaucoma, hemorrhage of rectum and anus, hx prostate cancer, hyperlipidemia, iron deficiency anemia, malignant tumor of prostate, peripheral neuropathy, rectal mass, thrombocytopenia, DM.    PT Comments  Pt greeted up in chair on arrival, eager for session and demonstrating continued progress towards acute goals. Pt able to progress stair training in anticipation of d/c to home with pt able to ascend/descend 10 steps with good recall for sequencing and no LOB. Pt with increased fatigue post stair training needing x3 standing rest breaks during gait on return to room. Educated pt on energy conservation techniques with pt verbalizing understanding. Pt continues to benefit from skilled PT services to progress toward functional mobility goals.     If plan is discharge home, recommend the following: Help with stairs or ramp for entrance   Can travel by private vehicle        Equipment Recommendations  None recommended by PT    Recommendations for Other Services       Precautions / Restrictions Precautions Precautions: Fall Restrictions Weight Bearing Restrictions: No     Mobility  Bed Mobility Overal bed mobility: Needs Assistance             General bed mobility comments: up in room upon arrival    Transfers Overall transfer level: Modified independent Equipment used: None Transfers: Sit to/from Stand Sit to Stand: Modified independent (Device/Increase time)           General transfer comment: CGA for stability on standing    Ambulation/Gait Ambulation/Gait assistance: Contact guard assist,  Supervision Gait Distance (Feet): 250 Feet Assistive device: None Gait Pattern/deviations: Step-through pattern, Decreased step length - right, Decreased step length - left, Narrow base of support Gait velocity: decr     General Gait Details: low clearance with pt dragging slippers throughout, able to improve clearance with encouragement, noted bil external roation of LEs throughout, x1 scissoring of feet with pt able to self correct with mild LOB. encouraged pt to stop before turning   Stairs Stairs: Yes Stairs assistance: Contact guard assist Stair Management: Forwards, Step to pattern, One rail Left Number of Stairs: 10 General stair comments: pt with good recall for LE sequencing, light cues on descent, CGA for safety   Wheelchair Mobility     Tilt Bed    Modified Rankin (Stroke Patients Only)       Balance Overall balance assessment: Needs assistance Sitting-balance support: Single extremity supported Sitting balance-Leahy Scale: Good     Standing balance support: No upper extremity supported, During functional activity Standing balance-Leahy Scale: Fair Standing balance comment: fair to good-. able to mobilize without AD no LOB this session                            Cognition Arousal: Alert Behavior During Therapy: WFL for tasks assessed/performed Overall Cognitive Status: Within Functional Limits for tasks assessed                                          Exercises  General Comments General comments (skin integrity, edema, etc.): VSS on RA, pt with more fatigue noted this session needing x3 standing rest breaks during gait      Pertinent Vitals/Pain      Home Living                          Prior Function            PT Goals (current goals can now be found in the care plan section) Acute Rehab PT Goals Patient Stated Goal: to return home and improve strength PT Goal Formulation: With patient Time For  Goal Achievement: 07/14/23 Progress towards PT goals: Progressing toward goals    Frequency    Min 1X/week      PT Plan      Co-evaluation              AM-PAC PT "6 Clicks" Mobility   Outcome Measure  Help needed turning from your back to your side while in a flat bed without using bedrails?: None Help needed moving from lying on your back to sitting on the side of a flat bed without using bedrails?: A Little Help needed moving to and from a bed to a chair (including a wheelchair)?: A Little Help needed standing up from a chair using your arms (e.g., wheelchair or bedside chair)?: A Little Help needed to walk in hospital room?: A Little Help needed climbing 3-5 steps with a railing? : A Little 6 Click Score: 19    End of Session Equipment Utilized During Treatment: Gait belt Activity Tolerance: Patient tolerated treatment well Patient left: with call bell/phone within reach;in chair Nurse Communication: Mobility status PT Visit Diagnosis: Other abnormalities of gait and mobility (R26.89);Unsteadiness on feet (R26.81);Muscle weakness (generalized) (M62.81)     Time: 3086-5784 PT Time Calculation (min) (ACUTE ONLY): 12 min  Charges:    $Gait Training: 8-22 mins PT General Charges $$ ACUTE PT VISIT: 1 Visit                     Sareena Odeh R. PTA Acute Rehabilitation Services Office: [phone]   Catalina Antigua 07/05/2023, 12:43 PM

## 2023-07-06 ENCOUNTER — Telehealth: Payer: Self-pay

## 2023-07-06 LAB — SURGICAL PATHOLOGY

## 2023-07-06 NOTE — Transitions of Care (Post Inpatient/ED Visit) (Signed)
07/06/2023  Name: William Jones MRN: [id number] DOB: [birth year]  Today's TOC FU Call Status: Today's TOC FU Call Status:: Successful TOC FU Call Completed TOC FU Call Complete Date: 07/06/23 Patient's Name and Date of Birth confirmed.  Transition Care Management Follow-up Telephone Call Date of Discharge: 07/05/23 Discharge Facility[facility] Type of Discharge: Inpatient Admission Primary Inpatient Discharge Diagnosis:: weakness How have you been since you were released from the hospital?: Better Any questions or concerns?: No  Items Reviewed: Did you receive and understand the discharge instructions provided?: Yes Medications obtained,verified, and reconciled?: Yes (Medications Reviewed) Any new allergies since your discharge?: No Dietary orders reviewed?: Yes Do you have support at home?: Yes People in Home: child(ren), adult  Medications Reviewed Today: Medications Reviewed Today     Reviewed by Karena Addison, LPN (Licensed Practical Nurse) on 07/06/23 at 1041  Med List Status: <None>   Medication Order Taking? Sig Documenting Provider Last Dose Status Informant  CALCIUM CITRATE PO [id number] No Take 600 mg by mouth daily. [provider] 06/28/2023 Active Self, Pharmacy Records  cholecalciferol (VITAMIN D3) 10 MCG (400 UNIT) TABS tablet [id number] No Take 1,000 Units by mouth daily. [provider] 06/28/2023 Active Self, Pharmacy Records  dorzolamide (TRUSOPT) 2 % ophthalmic solution [id number] No Place 1 drop into the right eye 2 (two) times daily. [provider] 06/28/2023 Active Self, Pharmacy Records  empagliflozin (JARDIANCE) 10 MG TABS tablet [id number] No Take 1 tablet (10 mg total) by mouth daily before breakfast. Monna Fam, MD 06/28/2023 Active Self, Pharmacy Records  feeding supplement (ENSURE ENLIVE / ENSURE PLUS) LIQD [id number]  Take 237 mLs by mouth 3 (three) times daily between meals. Morrie Sheldon, MD  Active   Ferrous  Sulfate (IRON PO) [id number] No Take 1 tablet by mouth daily. [provider] 06/28/2023 Active Self, Pharmacy Records  hydrochlorothiazide (HYDRODIURIL) 12.5 MG tablet [id number]  Take 1 tablet (12.5 mg total) by mouth daily. Morrie Sheldon, MD  Active   losartan (COZAAR) 50 MG tablet [id number]  Take 1 tablet (50 mg total) by mouth daily. Morrie Sheldon, MD  Active   Multiple Vitamins-Minerals (ONE-A-DAY PROACTIVE 65+) TABS [id number] No Take 1 tablet by mouth daily. [provider] 06/28/2023 Active Self, Pharmacy Records  pantoprazole (PROTONIX) 40 MG tablet [id number] No Take 1 tablet (40 mg total) by mouth daily. Monna Fam, MD 06/28/2023 Active Self, Pharmacy Records  timolol (BETIMOL) 0.5 % ophthalmic solution [id number] No Place 1 drop into the right eye 2 (two) times daily. [provider] 06/28/2023 Active Self, Pharmacy Records  vitamin E 180 MG (400 UNITS) capsule [id number] No Take 400 Units by mouth daily. [provider] 06/28/2023 Active Self, Pharmacy Records  XTANDI 40 MG capsule [id number] No Take 160 mg by mouth at bedtime. [provider] 06/28/2023 Active Self, Pharmacy Records           Med Note Helaine Chess, Marcia Brash   Thu Jun 30, 2023 10:52 AM)              Home Care and Equipment/Supplies: Were Home Health Services Ordered?: NA Any new equipment or medical supplies ordered?: NA  Functional Questionnaire: Do you need assistance with bathing/showering or dressing?: No Do you need assistance with meal preparation?: No Do you need assistance with eating?: No Do you have difficulty maintaining continence: No Do you need assistance with getting out of bed/getting out of a chair/moving?: No Do you have difficulty managing or taking your medications?: No  Follow  up appointments reviewed: PCP Follow-up appointment confirmed?: Yes Date of PCP follow-up appointment?: 07/21/23 Follow-up Provider: [facility]  Follow-up appointment confirmed?: NA Do you need transportation to your follow-up appointment?: No Do you understand care options if your condition(s) worsen?: Yes-patient verbalized understanding    SIGNATURE Karena Addison, LPN [facility] Nurse Health Advisor Direct Dial [phone]

## 2023-07-07 ENCOUNTER — Encounter: Payer: Self-pay | Admitting: Hematology and Oncology

## 2023-07-07 ENCOUNTER — Inpatient Hospital Stay: Payer: 59 | Attending: Hematology and Oncology | Admitting: Hematology and Oncology

## 2023-07-07 ENCOUNTER — Inpatient Hospital Stay: Payer: 59

## 2023-07-07 VITALS — BP 135/94 | HR 114 | Temp 96.8°F | Resp 18 | Wt 150.6 lb

## 2023-07-07 DIAGNOSIS — Z87891 Personal history of nicotine dependence: Secondary | ICD-10-CM | POA: Diagnosis not present

## 2023-07-07 DIAGNOSIS — Z923 Personal history of irradiation: Secondary | ICD-10-CM | POA: Insufficient documentation

## 2023-07-07 DIAGNOSIS — C801 Malignant (primary) neoplasm, unspecified: Secondary | ICD-10-CM | POA: Diagnosis not present

## 2023-07-07 DIAGNOSIS — Z8261 Family history of arthritis: Secondary | ICD-10-CM | POA: Insufficient documentation

## 2023-07-07 DIAGNOSIS — Z8719 Personal history of other diseases of the digestive system: Secondary | ICD-10-CM | POA: Insufficient documentation

## 2023-07-07 DIAGNOSIS — K429 Umbilical hernia without obstruction or gangrene: Secondary | ICD-10-CM | POA: Diagnosis not present

## 2023-07-07 DIAGNOSIS — I700 Atherosclerosis of aorta: Secondary | ICD-10-CM | POA: Diagnosis not present

## 2023-07-07 DIAGNOSIS — N200 Calculus of kidney: Secondary | ICD-10-CM | POA: Diagnosis not present

## 2023-07-07 DIAGNOSIS — Z79899 Other long term (current) drug therapy: Secondary | ICD-10-CM | POA: Diagnosis not present

## 2023-07-07 DIAGNOSIS — I119 Hypertensive heart disease without heart failure: Secondary | ICD-10-CM | POA: Diagnosis not present

## 2023-07-07 DIAGNOSIS — I371 Nonrheumatic pulmonary valve insufficiency: Secondary | ICD-10-CM | POA: Insufficient documentation

## 2023-07-07 DIAGNOSIS — C61 Malignant neoplasm of prostate: Secondary | ICD-10-CM | POA: Diagnosis present

## 2023-07-07 DIAGNOSIS — Z823 Family history of stroke: Secondary | ICD-10-CM | POA: Insufficient documentation

## 2023-07-07 DIAGNOSIS — K259 Gastric ulcer, unspecified as acute or chronic, without hemorrhage or perforation: Secondary | ICD-10-CM | POA: Diagnosis not present

## 2023-07-07 DIAGNOSIS — M47814 Spondylosis without myelopathy or radiculopathy, thoracic region: Secondary | ICD-10-CM | POA: Diagnosis not present

## 2023-07-07 LAB — CBC WITH DIFFERENTIAL/PLATELET
Abs Immature Granulocytes: 0.03 10*3/uL (ref 0.00–0.07)
Basophils Absolute: 0 10*3/uL (ref 0.0–0.1)
Basophils Relative: 0 %
Eosinophils Absolute: 0 10*3/uL (ref 0.0–0.5)
Eosinophils Relative: 0 %
HCT: 35 % — ABNORMAL LOW (ref 39.0–52.0)
Hemoglobin: 12.1 g/dL — ABNORMAL LOW (ref 13.0–17.0)
Immature Granulocytes: 1 %
Lymphocytes Relative: 3 %
Lymphs Abs: 0.2 10*3/uL — ABNORMAL LOW (ref 0.7–4.0)
MCH: 30.5 pg (ref 26.0–34.0)
MCHC: 34.6 g/dL (ref 30.0–36.0)
MCV: 88.2 fL (ref 80.0–100.0)
Monocytes Absolute: 0.6 10*3/uL (ref 0.1–1.0)
Monocytes Relative: 9 %
Neutro Abs: 5.6 10*3/uL (ref 1.7–7.7)
Neutrophils Relative %: 87 %
Platelets: 200 10*3/uL (ref 150–400)
RBC: 3.97 MIL/uL — ABNORMAL LOW (ref 4.22–5.81)
RDW: 14.9 % (ref 11.5–15.5)
WBC: 6.4 10*3/uL (ref 4.0–10.5)
nRBC: 0 % (ref 0.0–0.2)

## 2023-07-07 LAB — CMP (CANCER CENTER ONLY)
ALT: 129 U/L — ABNORMAL HIGH (ref 0–44)
AST: 50 U/L — ABNORMAL HIGH (ref 15–41)
Albumin: 3.7 g/dL (ref 3.5–5.0)
Alkaline Phosphatase: 123 U/L (ref 38–126)
Anion gap: 14 (ref 5–15)
BUN: 32 mg/dL — ABNORMAL HIGH (ref 8–23)
CO2: 24 mmol/L (ref 22–32)
Calcium: 7.7 mg/dL — ABNORMAL LOW (ref 8.9–10.3)
Chloride: 100 mmol/L (ref 98–111)
Creatinine: 1.02 mg/dL (ref 0.61–1.24)
GFR, Estimated: >60 mL/min (ref >60)
Glucose, Bld: 248 mg/dL — ABNORMAL HIGH (ref 70–99)
Potassium: 2.8 mmol/L — ABNORMAL LOW (ref 3.5–5.1)
Sodium: 138 mmol/L (ref 135–145)
Total Bilirubin: 0.7 mg/dL (ref 0.3–1.2)
Total Protein: 6.5 g/dL (ref 6.5–8.1)

## 2023-07-07 NOTE — Progress Notes (Signed)
Elevated liver enzymes EXAM: ULTRASOUND ABDOMEN LIMITED RIGHT UPPER QUADRANT COMPARISON:  None Available. FINDINGS: Gallbladder: No gallstones or wall thickening visualized. No sonographic Murphy sign noted by sonographer. Common bile duct: Diameter: 0.2 cm Liver:  Scattered mostly echogenic nodules are present throughout the liver. These were not present the CT of 10/30/2018. Some have a targetoid appearance as on image 37 of series 1. Appearance suspicious for diffuse hepatic metastatic disease. Portal vein is patent on color Doppler imaging with normal direction of blood flow towards the liver. Other: None. IMPRESSION: 1. Numerous echogenic nodules throughout the liver, suspicious for diffuse hepatic metastatic disease. Consider further workup such as CT of the chest, abdomen, pelvis with contrast for further characterization and to assess for potential primary. PET-CT could also be an alternative imaging choice or adjunct. The patient has a history of prostate cancer but this would be an unusual metastatic pathway for prostate cancer, and a gastrointestinal primary or liver primary would be more common. The possibility of macronodular cirrhosis is not entirely discarded but seems less likely given the appearance. Electronically Signed   By: Gaylyn Rong M.D.   On: 06/30/2023 13:45   ECHOCARDIOGRAM COMPLETE  Result Date: 06/30/2023    ECHOCARDIOGRAM REPORT   Patient Name:   William Jones Date of Exam: 06/30/2023 Medical Rec #:  [id number]             Height:       68.0 in Accession #:    [id number]            Weight:       175.7 lb Date of Birth:  [birth year]              BSA:          1.934 m Patient Age:    72 years              BP:           146/71 mmHg Patient Gender: M                     HR:           71 bpm. Exam Location:  Inpatient Procedure: 2D Echo, Cardiac Doppler and Color Doppler Indications:    R06.9 DOE; R60.0 Lower extremity edema; R53.83 Fatigue  History:        Patient has no prior history of Echocardiogram examinations.                 Signs/Symptoms:Dyspnea, Edema and Fatigue; Risk                 Factors:Hypertension, Diabetes, Former Smoker, History of                 substance abuse. and Dyslipidemia. Patient denies chest pain. He                  does have DOE, leg edema and extreme fatigue since 06/06/2023.  Sonographer:    Carlos American RVT, RDCS (AE), RDMS Referring Phys: [id number] EMILY B MULLEN  Sonographer Comments: Suboptimal apical window. Image acquisition challenging due to respiratory motion. IMPRESSIONS  1. Left ventricular ejection fraction, by estimation, is 60 to 65%. The left ventricle has normal function. The left ventricle has no regional wall motion abnormalities. There is mild concentric left ventricular hypertrophy. Left ventricular diastolic parameters are consistent with Grade I diastolic dysfunction (impaired relaxation).  2. Right ventricular systolic function is normal. The right ventricular  unremarkable. Other: Small fat containing umbilical hernia. Musculoskeletal: No lytic or blastic bone lesions. No acute bone abnormality. Osseous structures are age-appropriate. IMPRESSION: 1. Innumerable hypoenhancing masses throughout the liver, most in keeping with bilobar hepatic metastatic disease or a primary hepatic malignancy such as multifocal hepatocellular carcinoma. Correlation with serum alpha fetoprotein level may be helpful. If indicated, widespread hepatic metastatic disease should be easily amenable to ultrasound-guided tissue sampling for further evaluation. 2. Bilateral adrenal metastases. 3. Mild coronary artery calcification. 4. Nonobstructing right renal calculus. Aortic Atherosclerosis (ICD10-I70.0). Electronically Signed   By: Helyn Numbers M.D.   On: 06/30/2023 23:17   MR BRAIN W WO CONTRAST  Result Date: 06/30/2023 CLINICAL DATA:  Metastatic disease evaluation. EXAM: MRI HEAD WITHOUT AND WITH CONTRAST TECHNIQUE: Multiplanar, multiecho pulse sequences of the brain and surrounding structures were obtained without and with intravenous contrast. CONTRAST:  8mL GADAVIST GADOBUTROL 1 MMOL/ML IV SOLN COMPARISON:  Head CT 02/12/2018. FINDINGS: Brain: No acute infarct or hemorrhage. No hydrocephalus or extra-axial collection. No foci of abnormal susceptibility. No mass or abnormal enhancement. Vascular: Normal flow voids and vessel enhancement. Skull and upper cervical spine: Normal marrow signal and enhancement. Sinuses/Orbits: Near-complete opacification of the frontal sinuses, maxillary sinuses and right sphenoid sinus. Orbits are unremarkable. Other: None. IMPRESSION: 1. No evidence of intracranial metastatic disease. 2. Near-complete opacification of the frontal sinuses, maxillary sinuses and right sphenoid sinus. Electronically Signed   By: Orvan Falconer M.D.   On: 06/30/2023 21:43   US Abdomen Limited RUQ (LIVER/GB)  Result Date: 06/30/2023 CLINICAL DATA:  Elevated liver enzymes  EXAM: ULTRASOUND ABDOMEN LIMITED RIGHT UPPER QUADRANT COMPARISON:  None Available. FINDINGS: Gallbladder: No gallstones or wall thickening visualized. No sonographic Murphy sign noted by sonographer. Common bile duct: Diameter: 0.2 cm Liver: Scattered mostly echogenic nodules are present throughout the liver. These were not present the CT of 10/30/2018. Some have a targetoid appearance as on image 37 of series 1. Appearance suspicious for diffuse hepatic metastatic disease. Portal vein is patent on color Doppler imaging with normal direction of blood flow towards the liver. Other: None. IMPRESSION: 1. Numerous echogenic nodules throughout the liver, suspicious for diffuse hepatic metastatic disease. Consider further workup such as CT of the chest, abdomen, pelvis with contrast for further characterization and to assess for potential primary. PET-CT could also be an alternative imaging choice or adjunct. The patient has a history of prostate cancer but this would be an unusual metastatic pathway for prostate cancer, and a gastrointestinal primary or liver primary would be more common. The possibility of macronodular cirrhosis is not entirely discarded but seems less likely given the appearance. Electronically Signed   By: Gaylyn Rong M.D.   On: 06/30/2023 13:45   ECHOCARDIOGRAM COMPLETE  Result Date: 06/30/2023    ECHOCARDIOGRAM REPORT   Patient Name:   William Jones Date of Exam: 06/30/2023 Medical Rec #:  [id number]             Height:       68.0 in Accession #:    [id number]            Weight:       175.7 lb Date of Birth:  May 26, 1951              BSA:          1.934 m Patient Age:    72 years              BP:  Elevated liver enzymes EXAM: ULTRASOUND ABDOMEN LIMITED RIGHT UPPER QUADRANT COMPARISON:  None Available. FINDINGS: Gallbladder: No gallstones or wall thickening visualized. No sonographic Murphy sign noted by sonographer. Common bile duct: Diameter: 0.2 cm Liver:  Scattered mostly echogenic nodules are present throughout the liver. These were not present the CT of 10/30/2018. Some have a targetoid appearance as on image 37 of series 1. Appearance suspicious for diffuse hepatic metastatic disease. Portal vein is patent on color Doppler imaging with normal direction of blood flow towards the liver. Other: None. IMPRESSION: 1. Numerous echogenic nodules throughout the liver, suspicious for diffuse hepatic metastatic disease. Consider further workup such as CT of the chest, abdomen, pelvis with contrast for further characterization and to assess for potential primary. PET-CT could also be an alternative imaging choice or adjunct. The patient has a history of prostate cancer but this would be an unusual metastatic pathway for prostate cancer, and a gastrointestinal primary or liver primary would be more common. The possibility of macronodular cirrhosis is not entirely discarded but seems less likely given the appearance. Electronically Signed   By: Gaylyn Rong M.D.   On: 06/30/2023 13:45   ECHOCARDIOGRAM COMPLETE  Result Date: 06/30/2023    ECHOCARDIOGRAM REPORT   Patient Name:   William Jones Date of Exam: 06/30/2023 Medical Rec #:  [id number]             Height:       68.0 in Accession #:    [id number]            Weight:       175.7 lb Date of Birth:  [birth year]              BSA:          1.934 m Patient Age:    72 years              BP:           146/71 mmHg Patient Gender: M                     HR:           71 bpm. Exam Location:  Inpatient Procedure: 2D Echo, Cardiac Doppler and Color Doppler Indications:    R06.9 DOE; R60.0 Lower extremity edema; R53.83 Fatigue  History:        Patient has no prior history of Echocardiogram examinations.                 Signs/Symptoms:Dyspnea, Edema and Fatigue; Risk                 Factors:Hypertension, Diabetes, Former Smoker, History of                 substance abuse. and Dyslipidemia. Patient denies chest pain. He                  does have DOE, leg edema and extreme fatigue since 06/06/2023.  Sonographer:    Carlos American RVT, RDCS (AE), RDMS Referring Phys: [id number] EMILY B MULLEN  Sonographer Comments: Suboptimal apical window. Image acquisition challenging due to respiratory motion. IMPRESSIONS  1. Left ventricular ejection fraction, by estimation, is 60 to 65%. The left ventricle has normal function. The left ventricle has no regional wall motion abnormalities. There is mild concentric left ventricular hypertrophy. Left ventricular diastolic parameters are consistent with Grade I diastolic dysfunction (impaired relaxation).  2. Right ventricular systolic function is normal. The right ventricular  Elevated liver enzymes EXAM: ULTRASOUND ABDOMEN LIMITED RIGHT UPPER QUADRANT COMPARISON:  None Available. FINDINGS: Gallbladder: No gallstones or wall thickening visualized. No sonographic Murphy sign noted by sonographer. Common bile duct: Diameter: 0.2 cm Liver:  Scattered mostly echogenic nodules are present throughout the liver. These were not present the CT of 10/30/2018. Some have a targetoid appearance as on image 37 of series 1. Appearance suspicious for diffuse hepatic metastatic disease. Portal vein is patent on color Doppler imaging with normal direction of blood flow towards the liver. Other: None. IMPRESSION: 1. Numerous echogenic nodules throughout the liver, suspicious for diffuse hepatic metastatic disease. Consider further workup such as CT of the chest, abdomen, pelvis with contrast for further characterization and to assess for potential primary. PET-CT could also be an alternative imaging choice or adjunct. The patient has a history of prostate cancer but this would be an unusual metastatic pathway for prostate cancer, and a gastrointestinal primary or liver primary would be more common. The possibility of macronodular cirrhosis is not entirely discarded but seems less likely given the appearance. Electronically Signed   By: Gaylyn Rong M.D.   On: 06/30/2023 13:45   ECHOCARDIOGRAM COMPLETE  Result Date: 06/30/2023    ECHOCARDIOGRAM REPORT   Patient Name:   William Jones Date of Exam: 06/30/2023 Medical Rec #:  [id number]             Height:       68.0 in Accession #:    [id number]            Weight:       175.7 lb Date of Birth:  [birth year]              BSA:          1.934 m Patient Age:    72 years              BP:           146/71 mmHg Patient Gender: M                     HR:           71 bpm. Exam Location:  Inpatient Procedure: 2D Echo, Cardiac Doppler and Color Doppler Indications:    R06.9 DOE; R60.0 Lower extremity edema; R53.83 Fatigue  History:        Patient has no prior history of Echocardiogram examinations.                 Signs/Symptoms:Dyspnea, Edema and Fatigue; Risk                 Factors:Hypertension, Diabetes, Former Smoker, History of                 substance abuse. and Dyslipidemia. Patient denies chest pain. He                  does have DOE, leg edema and extreme fatigue since 06/06/2023.  Sonographer:    Carlos American RVT, RDCS (AE), RDMS Referring Phys: [id number] EMILY B MULLEN  Sonographer Comments: Suboptimal apical window. Image acquisition challenging due to respiratory motion. IMPRESSIONS  1. Left ventricular ejection fraction, by estimation, is 60 to 65%. The left ventricle has normal function. The left ventricle has no regional wall motion abnormalities. There is mild concentric left ventricular hypertrophy. Left ventricular diastolic parameters are consistent with Grade I diastolic dysfunction (impaired relaxation).  2. Right ventricular systolic function is normal. The right ventricular  Elevated liver enzymes EXAM: ULTRASOUND ABDOMEN LIMITED RIGHT UPPER QUADRANT COMPARISON:  None Available. FINDINGS: Gallbladder: No gallstones or wall thickening visualized. No sonographic Murphy sign noted by sonographer. Common bile duct: Diameter: 0.2 cm Liver:  Scattered mostly echogenic nodules are present throughout the liver. These were not present the CT of 10/30/2018. Some have a targetoid appearance as on image 37 of series 1. Appearance suspicious for diffuse hepatic metastatic disease. Portal vein is patent on color Doppler imaging with normal direction of blood flow towards the liver. Other: None. IMPRESSION: 1. Numerous echogenic nodules throughout the liver, suspicious for diffuse hepatic metastatic disease. Consider further workup such as CT of the chest, abdomen, pelvis with contrast for further characterization and to assess for potential primary. PET-CT could also be an alternative imaging choice or adjunct. The patient has a history of prostate cancer but this would be an unusual metastatic pathway for prostate cancer, and a gastrointestinal primary or liver primary would be more common. The possibility of macronodular cirrhosis is not entirely discarded but seems less likely given the appearance. Electronically Signed   By: Gaylyn Rong M.D.   On: 06/30/2023 13:45   ECHOCARDIOGRAM COMPLETE  Result Date: 06/30/2023    ECHOCARDIOGRAM REPORT   Patient Name:   William Jones Date of Exam: 06/30/2023 Medical Rec #:  [id number]             Height:       68.0 in Accession #:    [id number]            Weight:       175.7 lb Date of Birth:  [birth year]              BSA:          1.934 m Patient Age:    72 years              BP:           146/71 mmHg Patient Gender: M                     HR:           71 bpm. Exam Location:  Inpatient Procedure: 2D Echo, Cardiac Doppler and Color Doppler Indications:    R06.9 DOE; R60.0 Lower extremity edema; R53.83 Fatigue  History:        Patient has no prior history of Echocardiogram examinations.                 Signs/Symptoms:Dyspnea, Edema and Fatigue; Risk                 Factors:Hypertension, Diabetes, Former Smoker, History of                 substance abuse. and Dyslipidemia. Patient denies chest pain. He                  does have DOE, leg edema and extreme fatigue since 06/06/2023.  Sonographer:    Carlos American RVT, RDCS (AE), RDMS Referring Phys: [id number] EMILY B MULLEN  Sonographer Comments: Suboptimal apical window. Image acquisition challenging due to respiratory motion. IMPRESSIONS  1. Left ventricular ejection fraction, by estimation, is 60 to 65%. The left ventricle has normal function. The left ventricle has no regional wall motion abnormalities. There is mild concentric left ventricular hypertrophy. Left ventricular diastolic parameters are consistent with Grade I diastolic dysfunction (impaired relaxation).  2. Right ventricular systolic function is normal. The right ventricular  Elevated liver enzymes EXAM: ULTRASOUND ABDOMEN LIMITED RIGHT UPPER QUADRANT COMPARISON:  None Available. FINDINGS: Gallbladder: No gallstones or wall thickening visualized. No sonographic Murphy sign noted by sonographer. Common bile duct: Diameter: 0.2 cm Liver:  Scattered mostly echogenic nodules are present throughout the liver. These were not present the CT of 10/30/2018. Some have a targetoid appearance as on image 37 of series 1. Appearance suspicious for diffuse hepatic metastatic disease. Portal vein is patent on color Doppler imaging with normal direction of blood flow towards the liver. Other: None. IMPRESSION: 1. Numerous echogenic nodules throughout the liver, suspicious for diffuse hepatic metastatic disease. Consider further workup such as CT of the chest, abdomen, pelvis with contrast for further characterization and to assess for potential primary. PET-CT could also be an alternative imaging choice or adjunct. The patient has a history of prostate cancer but this would be an unusual metastatic pathway for prostate cancer, and a gastrointestinal primary or liver primary would be more common. The possibility of macronodular cirrhosis is not entirely discarded but seems less likely given the appearance. Electronically Signed   By: Gaylyn Rong M.D.   On: 06/30/2023 13:45   ECHOCARDIOGRAM COMPLETE  Result Date: 06/30/2023    ECHOCARDIOGRAM REPORT   Patient Name:   William Jones Date of Exam: 06/30/2023 Medical Rec #:  [id number]             Height:       68.0 in Accession #:    [id number]            Weight:       175.7 lb Date of Birth:  [birth year]              BSA:          1.934 m Patient Age:    72 years              BP:           146/71 mmHg Patient Gender: M                     HR:           71 bpm. Exam Location:  Inpatient Procedure: 2D Echo, Cardiac Doppler and Color Doppler Indications:    R06.9 DOE; R60.0 Lower extremity edema; R53.83 Fatigue  History:        Patient has no prior history of Echocardiogram examinations.                 Signs/Symptoms:Dyspnea, Edema and Fatigue; Risk                 Factors:Hypertension, Diabetes, Former Smoker, History of                 substance abuse. and Dyslipidemia. Patient denies chest pain. He                  does have DOE, leg edema and extreme fatigue since 06/06/2023.  Sonographer:    Carlos American RVT, RDCS (AE), RDMS Referring Phys: [id number] EMILY B MULLEN  Sonographer Comments: Suboptimal apical window. Image acquisition challenging due to respiratory motion. IMPRESSIONS  1. Left ventricular ejection fraction, by estimation, is 60 to 65%. The left ventricle has normal function. The left ventricle has no regional wall motion abnormalities. There is mild concentric left ventricular hypertrophy. Left ventricular diastolic parameters are consistent with Grade I diastolic dysfunction (impaired relaxation).  2. Right ventricular systolic function is normal. The right ventricular  Elevated liver enzymes EXAM: ULTRASOUND ABDOMEN LIMITED RIGHT UPPER QUADRANT COMPARISON:  None Available. FINDINGS: Gallbladder: No gallstones or wall thickening visualized. No sonographic Murphy sign noted by sonographer. Common bile duct: Diameter: 0.2 cm Liver:  Scattered mostly echogenic nodules are present throughout the liver. These were not present the CT of 10/30/2018. Some have a targetoid appearance as on image 37 of series 1. Appearance suspicious for diffuse hepatic metastatic disease. Portal vein is patent on color Doppler imaging with normal direction of blood flow towards the liver. Other: None. IMPRESSION: 1. Numerous echogenic nodules throughout the liver, suspicious for diffuse hepatic metastatic disease. Consider further workup such as CT of the chest, abdomen, pelvis with contrast for further characterization and to assess for potential primary. PET-CT could also be an alternative imaging choice or adjunct. The patient has a history of prostate cancer but this would be an unusual metastatic pathway for prostate cancer, and a gastrointestinal primary or liver primary would be more common. The possibility of macronodular cirrhosis is not entirely discarded but seems less likely given the appearance. Electronically Signed   By: Gaylyn Rong M.D.   On: 06/30/2023 13:45   ECHOCARDIOGRAM COMPLETE  Result Date: 06/30/2023    ECHOCARDIOGRAM REPORT   Patient Name:   William Jones Date of Exam: 06/30/2023 Medical Rec #:  [id number]             Height:       68.0 in Accession #:    [id number]            Weight:       175.7 lb Date of Birth:  [birth year]              BSA:          1.934 m Patient Age:    72 years              BP:           146/71 mmHg Patient Gender: M                     HR:           71 bpm. Exam Location:  Inpatient Procedure: 2D Echo, Cardiac Doppler and Color Doppler Indications:    R06.9 DOE; R60.0 Lower extremity edema; R53.83 Fatigue  History:        Patient has no prior history of Echocardiogram examinations.                 Signs/Symptoms:Dyspnea, Edema and Fatigue; Risk                 Factors:Hypertension, Diabetes, Former Smoker, History of                 substance abuse. and Dyslipidemia. Patient denies chest pain. He                  does have DOE, leg edema and extreme fatigue since 06/06/2023.  Sonographer:    Carlos American RVT, RDCS (AE), RDMS Referring Phys: [id number] EMILY B MULLEN  Sonographer Comments: Suboptimal apical window. Image acquisition challenging due to respiratory motion. IMPRESSIONS  1. Left ventricular ejection fraction, by estimation, is 60 to 65%. The left ventricle has normal function. The left ventricle has no regional wall motion abnormalities. There is mild concentric left ventricular hypertrophy. Left ventricular diastolic parameters are consistent with Grade I diastolic dysfunction (impaired relaxation).  2. Right ventricular systolic function is normal. The right ventricular  unremarkable. Other: Small fat containing umbilical hernia. Musculoskeletal: No lytic or blastic bone lesions. No acute bone abnormality. Osseous structures are age-appropriate. IMPRESSION: 1. Innumerable hypoenhancing masses throughout the liver, most in keeping with bilobar hepatic metastatic disease or a primary hepatic malignancy such as multifocal hepatocellular carcinoma. Correlation with serum alpha fetoprotein level may be helpful. If indicated, widespread hepatic metastatic disease should be easily amenable to ultrasound-guided tissue sampling for further evaluation. 2. Bilateral adrenal metastases. 3. Mild coronary artery calcification. 4. Nonobstructing right renal calculus. Aortic Atherosclerosis (ICD10-I70.0). Electronically Signed   By: Helyn Numbers M.D.   On: 06/30/2023 23:17   MR BRAIN W WO CONTRAST  Result Date: 06/30/2023 CLINICAL DATA:  Metastatic disease evaluation. EXAM: MRI HEAD WITHOUT AND WITH CONTRAST TECHNIQUE: Multiplanar, multiecho pulse sequences of the brain and surrounding structures were obtained without and with intravenous contrast. CONTRAST:  8mL GADAVIST GADOBUTROL 1 MMOL/ML IV SOLN COMPARISON:  Head CT 02/12/2018. FINDINGS: Brain: No acute infarct or hemorrhage. No hydrocephalus or extra-axial collection. No foci of abnormal susceptibility. No mass or abnormal enhancement. Vascular: Normal flow voids and vessel enhancement. Skull and upper cervical spine: Normal marrow signal and enhancement. Sinuses/Orbits: Near-complete opacification of the frontal sinuses, maxillary sinuses and right sphenoid sinus. Orbits are unremarkable. Other: None. IMPRESSION: 1. No evidence of intracranial metastatic disease. 2. Near-complete opacification of the frontal sinuses, maxillary sinuses and right sphenoid sinus. Electronically Signed   By: Orvan Falconer M.D.   On: 06/30/2023 21:43   US Abdomen Limited RUQ (LIVER/GB)  Result Date: 06/30/2023 CLINICAL DATA:  Elevated liver enzymes  EXAM: ULTRASOUND ABDOMEN LIMITED RIGHT UPPER QUADRANT COMPARISON:  None Available. FINDINGS: Gallbladder: No gallstones or wall thickening visualized. No sonographic Murphy sign noted by sonographer. Common bile duct: Diameter: 0.2 cm Liver: Scattered mostly echogenic nodules are present throughout the liver. These were not present the CT of 10/30/2018. Some have a targetoid appearance as on image 37 of series 1. Appearance suspicious for diffuse hepatic metastatic disease. Portal vein is patent on color Doppler imaging with normal direction of blood flow towards the liver. Other: None. IMPRESSION: 1. Numerous echogenic nodules throughout the liver, suspicious for diffuse hepatic metastatic disease. Consider further workup such as CT of the chest, abdomen, pelvis with contrast for further characterization and to assess for potential primary. PET-CT could also be an alternative imaging choice or adjunct. The patient has a history of prostate cancer but this would be an unusual metastatic pathway for prostate cancer, and a gastrointestinal primary or liver primary would be more common. The possibility of macronodular cirrhosis is not entirely discarded but seems less likely given the appearance. Electronically Signed   By: Gaylyn Rong M.D.   On: 06/30/2023 13:45   ECHOCARDIOGRAM COMPLETE  Result Date: 06/30/2023    ECHOCARDIOGRAM REPORT   Patient Name:   William Jones Date of Exam: 06/30/2023 Medical Rec #:  [id number]             Height:       68.0 in Accession #:    [id number]            Weight:       175.7 lb Date of Birth:  May 26, 1951              BSA:          1.934 m Patient Age:    72 years              BP:  Elevated liver enzymes EXAM: ULTRASOUND ABDOMEN LIMITED RIGHT UPPER QUADRANT COMPARISON:  None Available. FINDINGS: Gallbladder: No gallstones or wall thickening visualized. No sonographic Murphy sign noted by sonographer. Common bile duct: Diameter: 0.2 cm Liver:  Scattered mostly echogenic nodules are present throughout the liver. These were not present the CT of 10/30/2018. Some have a targetoid appearance as on image 37 of series 1. Appearance suspicious for diffuse hepatic metastatic disease. Portal vein is patent on color Doppler imaging with normal direction of blood flow towards the liver. Other: None. IMPRESSION: 1. Numerous echogenic nodules throughout the liver, suspicious for diffuse hepatic metastatic disease. Consider further workup such as CT of the chest, abdomen, pelvis with contrast for further characterization and to assess for potential primary. PET-CT could also be an alternative imaging choice or adjunct. The patient has a history of prostate cancer but this would be an unusual metastatic pathway for prostate cancer, and a gastrointestinal primary or liver primary would be more common. The possibility of macronodular cirrhosis is not entirely discarded but seems less likely given the appearance. Electronically Signed   By: Gaylyn Rong M.D.   On: 06/30/2023 13:45   ECHOCARDIOGRAM COMPLETE  Result Date: 06/30/2023    ECHOCARDIOGRAM REPORT   Patient Name:   William Jones Date of Exam: 06/30/2023 Medical Rec #:  [id number]             Height:       68.0 in Accession #:    [id number]            Weight:       175.7 lb Date of Birth:  [birth year]              BSA:          1.934 m Patient Age:    72 years              BP:           146/71 mmHg Patient Gender: M                     HR:           71 bpm. Exam Location:  Inpatient Procedure: 2D Echo, Cardiac Doppler and Color Doppler Indications:    R06.9 DOE; R60.0 Lower extremity edema; R53.83 Fatigue  History:        Patient has no prior history of Echocardiogram examinations.                 Signs/Symptoms:Dyspnea, Edema and Fatigue; Risk                 Factors:Hypertension, Diabetes, Former Smoker, History of                 substance abuse. and Dyslipidemia. Patient denies chest pain. He                  does have DOE, leg edema and extreme fatigue since 06/06/2023.  Sonographer:    Carlos American RVT, RDCS (AE), RDMS Referring Phys: [id number] EMILY B MULLEN  Sonographer Comments: Suboptimal apical window. Image acquisition challenging due to respiratory motion. IMPRESSIONS  1. Left ventricular ejection fraction, by estimation, is 60 to 65%. The left ventricle has normal function. The left ventricle has no regional wall motion abnormalities. There is mild concentric left ventricular hypertrophy. Left ventricular diastolic parameters are consistent with Grade I diastolic dysfunction (impaired relaxation).  2. Right ventricular systolic function is normal. The right ventricular  Elevated liver enzymes EXAM: ULTRASOUND ABDOMEN LIMITED RIGHT UPPER QUADRANT COMPARISON:  None Available. FINDINGS: Gallbladder: No gallstones or wall thickening visualized. No sonographic Murphy sign noted by sonographer. Common bile duct: Diameter: 0.2 cm Liver:  Scattered mostly echogenic nodules are present throughout the liver. These were not present the CT of 10/30/2018. Some have a targetoid appearance as on image 37 of series 1. Appearance suspicious for diffuse hepatic metastatic disease. Portal vein is patent on color Doppler imaging with normal direction of blood flow towards the liver. Other: None. IMPRESSION: 1. Numerous echogenic nodules throughout the liver, suspicious for diffuse hepatic metastatic disease. Consider further workup such as CT of the chest, abdomen, pelvis with contrast for further characterization and to assess for potential primary. PET-CT could also be an alternative imaging choice or adjunct. The patient has a history of prostate cancer but this would be an unusual metastatic pathway for prostate cancer, and a gastrointestinal primary or liver primary would be more common. The possibility of macronodular cirrhosis is not entirely discarded but seems less likely given the appearance. Electronically Signed   By: Gaylyn Rong M.D.   On: 06/30/2023 13:45   ECHOCARDIOGRAM COMPLETE  Result Date: 06/30/2023    ECHOCARDIOGRAM REPORT   Patient Name:   William Jones Date of Exam: 06/30/2023 Medical Rec #:  [id number]             Height:       68.0 in Accession #:    [id number]            Weight:       175.7 lb Date of Birth:  [birth year]              BSA:          1.934 m Patient Age:    72 years              BP:           146/71 mmHg Patient Gender: M                     HR:           71 bpm. Exam Location:  Inpatient Procedure: 2D Echo, Cardiac Doppler and Color Doppler Indications:    R06.9 DOE; R60.0 Lower extremity edema; R53.83 Fatigue  History:        Patient has no prior history of Echocardiogram examinations.                 Signs/Symptoms:Dyspnea, Edema and Fatigue; Risk                 Factors:Hypertension, Diabetes, Former Smoker, History of                 substance abuse. and Dyslipidemia. Patient denies chest pain. He                  does have DOE, leg edema and extreme fatigue since 06/06/2023.  Sonographer:    Carlos American RVT, RDCS (AE), RDMS Referring Phys: [id number] EMILY B MULLEN  Sonographer Comments: Suboptimal apical window. Image acquisition challenging due to respiratory motion. IMPRESSIONS  1. Left ventricular ejection fraction, by estimation, is 60 to 65%. The left ventricle has normal function. The left ventricle has no regional wall motion abnormalities. There is mild concentric left ventricular hypertrophy. Left ventricular diastolic parameters are consistent with Grade I diastolic dysfunction (impaired relaxation).  2. Right ventricular systolic function is normal. The right ventricular  unremarkable. Other: Small fat containing umbilical hernia. Musculoskeletal: No lytic or blastic bone lesions. No acute bone abnormality. Osseous structures are age-appropriate. IMPRESSION: 1. Innumerable hypoenhancing masses throughout the liver, most in keeping with bilobar hepatic metastatic disease or a primary hepatic malignancy such as multifocal hepatocellular carcinoma. Correlation with serum alpha fetoprotein level may be helpful. If indicated, widespread hepatic metastatic disease should be easily amenable to ultrasound-guided tissue sampling for further evaluation. 2. Bilateral adrenal metastases. 3. Mild coronary artery calcification. 4. Nonobstructing right renal calculus. Aortic Atherosclerosis (ICD10-I70.0). Electronically Signed   By: Helyn Numbers M.D.   On: 06/30/2023 23:17   MR BRAIN W WO CONTRAST  Result Date: 06/30/2023 CLINICAL DATA:  Metastatic disease evaluation. EXAM: MRI HEAD WITHOUT AND WITH CONTRAST TECHNIQUE: Multiplanar, multiecho pulse sequences of the brain and surrounding structures were obtained without and with intravenous contrast. CONTRAST:  8mL GADAVIST GADOBUTROL 1 MMOL/ML IV SOLN COMPARISON:  Head CT 02/12/2018. FINDINGS: Brain: No acute infarct or hemorrhage. No hydrocephalus or extra-axial collection. No foci of abnormal susceptibility. No mass or abnormal enhancement. Vascular: Normal flow voids and vessel enhancement. Skull and upper cervical spine: Normal marrow signal and enhancement. Sinuses/Orbits: Near-complete opacification of the frontal sinuses, maxillary sinuses and right sphenoid sinus. Orbits are unremarkable. Other: None. IMPRESSION: 1. No evidence of intracranial metastatic disease. 2. Near-complete opacification of the frontal sinuses, maxillary sinuses and right sphenoid sinus. Electronically Signed   By: Orvan Falconer M.D.   On: 06/30/2023 21:43   US Abdomen Limited RUQ (LIVER/GB)  Result Date: 06/30/2023 CLINICAL DATA:  Elevated liver enzymes  EXAM: ULTRASOUND ABDOMEN LIMITED RIGHT UPPER QUADRANT COMPARISON:  None Available. FINDINGS: Gallbladder: No gallstones or wall thickening visualized. No sonographic Murphy sign noted by sonographer. Common bile duct: Diameter: 0.2 cm Liver: Scattered mostly echogenic nodules are present throughout the liver. These were not present the CT of 10/30/2018. Some have a targetoid appearance as on image 37 of series 1. Appearance suspicious for diffuse hepatic metastatic disease. Portal vein is patent on color Doppler imaging with normal direction of blood flow towards the liver. Other: None. IMPRESSION: 1. Numerous echogenic nodules throughout the liver, suspicious for diffuse hepatic metastatic disease. Consider further workup such as CT of the chest, abdomen, pelvis with contrast for further characterization and to assess for potential primary. PET-CT could also be an alternative imaging choice or adjunct. The patient has a history of prostate cancer but this would be an unusual metastatic pathway for prostate cancer, and a gastrointestinal primary or liver primary would be more common. The possibility of macronodular cirrhosis is not entirely discarded but seems less likely given the appearance. Electronically Signed   By: Gaylyn Rong M.D.   On: 06/30/2023 13:45   ECHOCARDIOGRAM COMPLETE  Result Date: 06/30/2023    ECHOCARDIOGRAM REPORT   Patient Name:   William Jones Date of Exam: 06/30/2023 Medical Rec #:  [id number]             Height:       68.0 in Accession #:    [id number]            Weight:       175.7 lb Date of Birth:  May 26, 1951              BSA:          1.934 m Patient Age:    72 years              BP:  Elevated liver enzymes EXAM: ULTRASOUND ABDOMEN LIMITED RIGHT UPPER QUADRANT COMPARISON:  None Available. FINDINGS: Gallbladder: No gallstones or wall thickening visualized. No sonographic Murphy sign noted by sonographer. Common bile duct: Diameter: 0.2 cm Liver:  Scattered mostly echogenic nodules are present throughout the liver. These were not present the CT of 10/30/2018. Some have a targetoid appearance as on image 37 of series 1. Appearance suspicious for diffuse hepatic metastatic disease. Portal vein is patent on color Doppler imaging with normal direction of blood flow towards the liver. Other: None. IMPRESSION: 1. Numerous echogenic nodules throughout the liver, suspicious for diffuse hepatic metastatic disease. Consider further workup such as CT of the chest, abdomen, pelvis with contrast for further characterization and to assess for potential primary. PET-CT could also be an alternative imaging choice or adjunct. The patient has a history of prostate cancer but this would be an unusual metastatic pathway for prostate cancer, and a gastrointestinal primary or liver primary would be more common. The possibility of macronodular cirrhosis is not entirely discarded but seems less likely given the appearance. Electronically Signed   By: Gaylyn Rong M.D.   On: 06/30/2023 13:45   ECHOCARDIOGRAM COMPLETE  Result Date: 06/30/2023    ECHOCARDIOGRAM REPORT   Patient Name:   William Jones Date of Exam: 06/30/2023 Medical Rec #:  [id number]             Height:       68.0 in Accession #:    [id number]            Weight:       175.7 lb Date of Birth:  [birth year]              BSA:          1.934 m Patient Age:    72 years              BP:           146/71 mmHg Patient Gender: M                     HR:           71 bpm. Exam Location:  Inpatient Procedure: 2D Echo, Cardiac Doppler and Color Doppler Indications:    R06.9 DOE; R60.0 Lower extremity edema; R53.83 Fatigue  History:        Patient has no prior history of Echocardiogram examinations.                 Signs/Symptoms:Dyspnea, Edema and Fatigue; Risk                 Factors:Hypertension, Diabetes, Former Smoker, History of                 substance abuse. and Dyslipidemia. Patient denies chest pain. He                  does have DOE, leg edema and extreme fatigue since 06/06/2023.  Sonographer:    Carlos American RVT, RDCS (AE), RDMS Referring Phys: [id number] EMILY B MULLEN  Sonographer Comments: Suboptimal apical window. Image acquisition challenging due to respiratory motion. IMPRESSIONS  1. Left ventricular ejection fraction, by estimation, is 60 to 65%. The left ventricle has normal function. The left ventricle has no regional wall motion abnormalities. There is mild concentric left ventricular hypertrophy. Left ventricular diastolic parameters are consistent with Grade I diastolic dysfunction (impaired relaxation).  2. Right ventricular systolic function is normal. The right ventricular  Elevated liver enzymes EXAM: ULTRASOUND ABDOMEN LIMITED RIGHT UPPER QUADRANT COMPARISON:  None Available. FINDINGS: Gallbladder: No gallstones or wall thickening visualized. No sonographic Murphy sign noted by sonographer. Common bile duct: Diameter: 0.2 cm Liver:  Scattered mostly echogenic nodules are present throughout the liver. These were not present the CT of 10/30/2018. Some have a targetoid appearance as on image 37 of series 1. Appearance suspicious for diffuse hepatic metastatic disease. Portal vein is patent on color Doppler imaging with normal direction of blood flow towards the liver. Other: None. IMPRESSION: 1. Numerous echogenic nodules throughout the liver, suspicious for diffuse hepatic metastatic disease. Consider further workup such as CT of the chest, abdomen, pelvis with contrast for further characterization and to assess for potential primary. PET-CT could also be an alternative imaging choice or adjunct. The patient has a history of prostate cancer but this would be an unusual metastatic pathway for prostate cancer, and a gastrointestinal primary or liver primary would be more common. The possibility of macronodular cirrhosis is not entirely discarded but seems less likely given the appearance. Electronically Signed   By: Gaylyn Rong M.D.   On: 06/30/2023 13:45   ECHOCARDIOGRAM COMPLETE  Result Date: 06/30/2023    ECHOCARDIOGRAM REPORT   Patient Name:   William Jones Date of Exam: 06/30/2023 Medical Rec #:  [id number]             Height:       68.0 in Accession #:    [id number]            Weight:       175.7 lb Date of Birth:  [birth year]              BSA:          1.934 m Patient Age:    72 years              BP:           146/71 mmHg Patient Gender: M                     HR:           71 bpm. Exam Location:  Inpatient Procedure: 2D Echo, Cardiac Doppler and Color Doppler Indications:    R06.9 DOE; R60.0 Lower extremity edema; R53.83 Fatigue  History:        Patient has no prior history of Echocardiogram examinations.                 Signs/Symptoms:Dyspnea, Edema and Fatigue; Risk                 Factors:Hypertension, Diabetes, Former Smoker, History of                 substance abuse. and Dyslipidemia. Patient denies chest pain. He                  does have DOE, leg edema and extreme fatigue since 06/06/2023.  Sonographer:    Carlos American RVT, RDCS (AE), RDMS Referring Phys: [id number] EMILY B MULLEN  Sonographer Comments: Suboptimal apical window. Image acquisition challenging due to respiratory motion. IMPRESSIONS  1. Left ventricular ejection fraction, by estimation, is 60 to 65%. The left ventricle has normal function. The left ventricle has no regional wall motion abnormalities. There is mild concentric left ventricular hypertrophy. Left ventricular diastolic parameters are consistent with Grade I diastolic dysfunction (impaired relaxation).  2. Right ventricular systolic function is normal. The right ventricular

## 2023-07-07 NOTE — Progress Notes (Signed)
START ON PATHWAY REGIMEN - Neuroendocrine     A cycle is every 21 days:     Carboplatin      Etoposide   **Always confirm dose/schedule in your pharmacy ordering system**  Patient Characteristics: GI Tract, Poorly-differentiated, First Line Tumor Location: GI Tract Line of therapy: First Line  Intent of Therapy: Non-Curative / Palliative Intent, Discussed with Patient 

## 2023-07-08 ENCOUNTER — Telehealth: Payer: Self-pay | Admitting: *Deleted

## 2023-07-08 ENCOUNTER — Telehealth: Payer: Self-pay | Admitting: Hematology and Oncology

## 2023-07-08 ENCOUNTER — Other Ambulatory Visit: Payer: Self-pay

## 2023-07-08 MED ORDER — POTASSIUM CHLORIDE ER 10 MEQ PO CPCR: 20.0000 meq | ORAL_CAPSULE | Freq: Every day | ORAL | 3 refills | Status: DC

## 2023-07-08 NOTE — Telephone Encounter (Signed)
Patient is aware and confirmed all scheduled appointment times/dates regarding patient education and treatment dates

## 2023-07-08 NOTE — Telephone Encounter (Signed)
-----   Message from Elliott Iruku sent at 07/07/2023  4:48 PM EDT ----- William Jones, he needs to take potassium 20 meq BID for 5 days and then continue 20 meq daily> Can we let him know and send a prescription please.

## 2023-07-10 ENCOUNTER — Encounter: Payer: Self-pay | Admitting: Hematology and Oncology

## 2023-07-10 ENCOUNTER — Encounter (HOSPITAL_COMMUNITY): Payer: Self-pay | Admitting: Internal Medicine

## 2023-07-10 ENCOUNTER — Emergency Department (HOSPITAL_COMMUNITY): Payer: 59

## 2023-07-10 ENCOUNTER — Other Ambulatory Visit: Payer: Self-pay

## 2023-07-10 ENCOUNTER — Inpatient Hospital Stay (HOSPITAL_COMMUNITY)
Admission: EM | Admit: 2023-07-10 | Discharge: 2023-08-05 | DRG: 871 | Disposition: E | Payer: 59 | Attending: Internal Medicine | Admitting: Internal Medicine

## 2023-07-10 DIAGNOSIS — R627 Adult failure to thrive: Secondary | ICD-10-CM | POA: Diagnosis present

## 2023-07-10 DIAGNOSIS — R6521 Severe sepsis with septic shock: Secondary | ICD-10-CM | POA: Diagnosis present

## 2023-07-10 DIAGNOSIS — E1165 Type 2 diabetes mellitus with hyperglycemia: Secondary | ICD-10-CM | POA: Diagnosis present

## 2023-07-10 DIAGNOSIS — Z66 Do not resuscitate: Secondary | ICD-10-CM | POA: Diagnosis not present

## 2023-07-10 DIAGNOSIS — E785 Hyperlipidemia, unspecified: Secondary | ICD-10-CM | POA: Diagnosis present

## 2023-07-10 DIAGNOSIS — Z7189 Other specified counseling: Secondary | ICD-10-CM

## 2023-07-10 DIAGNOSIS — R64 Cachexia: Secondary | ICD-10-CM | POA: Diagnosis present

## 2023-07-10 DIAGNOSIS — K254 Chronic or unspecified gastric ulcer with hemorrhage: Secondary | ICD-10-CM | POA: Diagnosis present

## 2023-07-10 DIAGNOSIS — E878 Other disorders of electrolyte and fluid balance, not elsewhere classified: Secondary | ICD-10-CM | POA: Diagnosis present

## 2023-07-10 DIAGNOSIS — R54 Age-related physical debility: Secondary | ICD-10-CM | POA: Diagnosis present

## 2023-07-10 DIAGNOSIS — Z711 Person with feared health complaint in whom no diagnosis is made: Secondary | ICD-10-CM

## 2023-07-10 DIAGNOSIS — D62 Acute posthemorrhagic anemia: Secondary | ICD-10-CM | POA: Diagnosis not present

## 2023-07-10 DIAGNOSIS — D638 Anemia in other chronic diseases classified elsewhere: Secondary | ICD-10-CM | POA: Diagnosis present

## 2023-07-10 DIAGNOSIS — E440 Moderate protein-calorie malnutrition: Secondary | ICD-10-CM | POA: Diagnosis present

## 2023-07-10 DIAGNOSIS — B961 Klebsiella pneumoniae [K. pneumoniae] as the cause of diseases classified elsewhere: Secondary | ICD-10-CM | POA: Diagnosis present

## 2023-07-10 DIAGNOSIS — A419 Sepsis, unspecified organism: Secondary | ICD-10-CM | POA: Diagnosis not present

## 2023-07-10 DIAGNOSIS — C801 Malignant (primary) neoplasm, unspecified: Secondary | ICD-10-CM | POA: Diagnosis present

## 2023-07-10 DIAGNOSIS — Z1152 Encounter for screening for COVID-19: Secondary | ICD-10-CM

## 2023-07-10 DIAGNOSIS — I5033 Acute on chronic diastolic (congestive) heart failure: Secondary | ICD-10-CM | POA: Diagnosis present

## 2023-07-10 DIAGNOSIS — B9689 Other specified bacterial agents as the cause of diseases classified elsewhere: Secondary | ICD-10-CM | POA: Diagnosis present

## 2023-07-10 DIAGNOSIS — H409 Unspecified glaucoma: Secondary | ICD-10-CM | POA: Diagnosis present

## 2023-07-10 DIAGNOSIS — Z923 Personal history of irradiation: Secondary | ICD-10-CM

## 2023-07-10 DIAGNOSIS — R4589 Other symptoms and signs involving emotional state: Secondary | ICD-10-CM

## 2023-07-10 DIAGNOSIS — R531 Weakness: Secondary | ICD-10-CM

## 2023-07-10 DIAGNOSIS — Z79899 Other long term (current) drug therapy: Secondary | ICD-10-CM

## 2023-07-10 DIAGNOSIS — D696 Thrombocytopenia, unspecified: Secondary | ICD-10-CM | POA: Diagnosis not present

## 2023-07-10 DIAGNOSIS — I110 Hypertensive heart disease with heart failure: Secondary | ICD-10-CM | POA: Diagnosis present

## 2023-07-10 DIAGNOSIS — R5383 Other fatigue: Secondary | ICD-10-CM

## 2023-07-10 DIAGNOSIS — K627 Radiation proctitis: Secondary | ICD-10-CM | POA: Diagnosis present

## 2023-07-10 DIAGNOSIS — N3000 Acute cystitis without hematuria: Principal | ICD-10-CM | POA: Diagnosis present

## 2023-07-10 DIAGNOSIS — C787 Secondary malignant neoplasm of liver and intrahepatic bile duct: Secondary | ICD-10-CM | POA: Diagnosis present

## 2023-07-10 DIAGNOSIS — J80 Acute respiratory distress syndrome: Secondary | ICD-10-CM | POA: Diagnosis not present

## 2023-07-10 DIAGNOSIS — K3189 Other diseases of stomach and duodenum: Secondary | ICD-10-CM | POA: Diagnosis present

## 2023-07-10 DIAGNOSIS — K5730 Diverticulosis of large intestine without perforation or abscess without bleeding: Secondary | ICD-10-CM | POA: Diagnosis present

## 2023-07-10 DIAGNOSIS — Z8546 Personal history of malignant neoplasm of prostate: Secondary | ICD-10-CM

## 2023-07-10 DIAGNOSIS — C61 Malignant neoplasm of prostate: Secondary | ICD-10-CM | POA: Diagnosis present

## 2023-07-10 DIAGNOSIS — Z7984 Long term (current) use of oral hypoglycemic drugs: Secondary | ICD-10-CM

## 2023-07-10 DIAGNOSIS — F419 Anxiety disorder, unspecified: Secondary | ICD-10-CM | POA: Diagnosis present

## 2023-07-10 DIAGNOSIS — Z6822 Body mass index (BMI) 22.0-22.9, adult: Secondary | ICD-10-CM

## 2023-07-10 DIAGNOSIS — Z888 Allergy status to other drugs, medicaments and biological substances status: Secondary | ICD-10-CM

## 2023-07-10 DIAGNOSIS — C7951 Secondary malignant neoplasm of bone: Secondary | ICD-10-CM | POA: Diagnosis present

## 2023-07-10 DIAGNOSIS — E872 Acidosis, unspecified: Secondary | ICD-10-CM | POA: Diagnosis not present

## 2023-07-10 DIAGNOSIS — N390 Urinary tract infection, site not specified: Secondary | ICD-10-CM | POA: Diagnosis present

## 2023-07-10 DIAGNOSIS — J189 Pneumonia, unspecified organism: Secondary | ICD-10-CM | POA: Diagnosis present

## 2023-07-10 DIAGNOSIS — I10 Essential (primary) hypertension: Secondary | ICD-10-CM | POA: Diagnosis present

## 2023-07-10 DIAGNOSIS — E876 Hypokalemia: Secondary | ICD-10-CM | POA: Diagnosis present

## 2023-07-10 DIAGNOSIS — Z515 Encounter for palliative care: Secondary | ICD-10-CM

## 2023-07-10 DIAGNOSIS — E8770 Fluid overload, unspecified: Secondary | ICD-10-CM | POA: Diagnosis not present

## 2023-07-10 DIAGNOSIS — R579 Shock, unspecified: Secondary | ICD-10-CM | POA: Diagnosis present

## 2023-07-10 DIAGNOSIS — C7A8 Other malignant neuroendocrine tumors: Secondary | ICD-10-CM | POA: Diagnosis present

## 2023-07-10 DIAGNOSIS — E119 Type 2 diabetes mellitus without complications: Secondary | ICD-10-CM

## 2023-07-10 DIAGNOSIS — C7970 Secondary malignant neoplasm of unspecified adrenal gland: Secondary | ICD-10-CM | POA: Diagnosis present

## 2023-07-10 DIAGNOSIS — B59 Pneumocystosis: Secondary | ICD-10-CM | POA: Diagnosis not present

## 2023-07-10 DIAGNOSIS — Y842 Radiological procedure and radiotherapy as the cause of abnormal reaction of the patient, or of later complication, without mention of misadventure at the time of the procedure: Secondary | ICD-10-CM | POA: Diagnosis present

## 2023-07-10 DIAGNOSIS — K7200 Acute and subacute hepatic failure without coma: Secondary | ICD-10-CM | POA: Diagnosis not present

## 2023-07-10 DIAGNOSIS — E8809 Other disorders of plasma-protein metabolism, not elsewhere classified: Secondary | ICD-10-CM | POA: Diagnosis present

## 2023-07-10 DIAGNOSIS — K644 Residual hemorrhoidal skin tags: Secondary | ICD-10-CM | POA: Diagnosis present

## 2023-07-10 DIAGNOSIS — J810 Acute pulmonary edema: Secondary | ICD-10-CM

## 2023-07-10 DIAGNOSIS — Z87891 Personal history of nicotine dependence: Secondary | ICD-10-CM

## 2023-07-10 DIAGNOSIS — J9601 Acute respiratory failure with hypoxia: Secondary | ICD-10-CM | POA: Diagnosis present

## 2023-07-10 LAB — URINALYSIS, W/ REFLEX TO CULTURE (INFECTION SUSPECTED)
Bilirubin Urine: NEGATIVE
Glucose, UA: >=500 mg/dL — AB
Ketones, ur: 20 mg/dL — AB
Leukocytes,Ua: NEGATIVE
Nitrite: POSITIVE — AB
Protein, ur: NEGATIVE mg/dL
Specific Gravity, Urine: 1.026 (ref 1.005–1.030)
pH: 6 (ref 5.0–8.0)

## 2023-07-10 LAB — COMPREHENSIVE METABOLIC PANEL
ALT: 90 U/L — ABNORMAL HIGH (ref 0–44)
AST: 50 U/L — ABNORMAL HIGH (ref 15–41)
Albumin: 2.9 g/dL — ABNORMAL LOW (ref 3.5–5.0)
Alkaline Phosphatase: 100 U/L (ref 38–126)
Anion gap: 16 — ABNORMAL HIGH (ref 5–15)
BUN: 26 mg/dL — ABNORMAL HIGH (ref 8–23)
CO2: 23 mmol/L (ref 22–32)
Calcium: 7.4 mg/dL — ABNORMAL LOW (ref 8.9–10.3)
Chloride: 96 mmol/L — ABNORMAL LOW (ref 98–111)
Creatinine, Ser: 0.72 mg/dL (ref 0.61–1.24)
GFR, Estimated: >60 mL/min (ref >60)
Glucose, Bld: 267 mg/dL — ABNORMAL HIGH (ref 70–99)
Potassium: 3 mmol/L — ABNORMAL LOW (ref 3.5–5.1)
Sodium: 135 mmol/L (ref 135–145)
Total Bilirubin: 1.1 mg/dL (ref 0.3–1.2)
Total Protein: 5.9 g/dL — ABNORMAL LOW (ref 6.5–8.1)

## 2023-07-10 LAB — CBC WITH DIFFERENTIAL/PLATELET
Abs Immature Granulocytes: 0.03 10*3/uL (ref 0.00–0.07)
Basophils Absolute: 0 10*3/uL (ref 0.0–0.1)
Basophils Relative: 0 %
Eosinophils Absolute: 0 10*3/uL (ref 0.0–0.5)
Eosinophils Relative: 0 %
HCT: 32 % — ABNORMAL LOW (ref 39.0–52.0)
Hemoglobin: 10.8 g/dL — ABNORMAL LOW (ref 13.0–17.0)
Immature Granulocytes: 1 %
Lymphocytes Relative: 3 %
Lymphs Abs: 0.2 10*3/uL — ABNORMAL LOW (ref 0.7–4.0)
MCH: 30.9 pg (ref 26.0–34.0)
MCHC: 33.8 g/dL (ref 30.0–36.0)
MCV: 91.7 fL (ref 80.0–100.0)
Monocytes Absolute: 0.4 10*3/uL (ref 0.1–1.0)
Monocytes Relative: 7 %
Neutro Abs: 5.5 10*3/uL (ref 1.7–7.7)
Neutrophils Relative %: 89 %
Platelets: 170 10*3/uL (ref 150–400)
RBC: 3.49 MIL/uL — ABNORMAL LOW (ref 4.22–5.81)
RDW: 15.5 % (ref 11.5–15.5)
WBC: 6.1 10*3/uL (ref 4.0–10.5)
nRBC: 0 % (ref 0.0–0.2)

## 2023-07-10 LAB — PROTIME-INR
INR: 1.1 (ref 0.8–1.2)
Prothrombin Time: 13.9 s (ref 11.4–15.2)

## 2023-07-10 LAB — LACTIC ACID, PLASMA
Lactic Acid, Venous: 1.2 mmol/L (ref 0.5–1.9)
Lactic Acid, Venous: 1.4 mmol/L (ref 0.5–1.9)

## 2023-07-10 LAB — TSH: TSH: 1.073 u[IU]/mL (ref 0.350–4.500)

## 2023-07-10 LAB — RESP PANEL BY RT-PCR (RSV, FLU A&B, COVID)  RVPGX2
Influenza A by PCR: NEGATIVE
Influenza B by PCR: NEGATIVE
Resp Syncytial Virus by PCR: NEGATIVE
SARS Coronavirus 2 by RT PCR: NEGATIVE

## 2023-07-10 LAB — BRAIN NATRIURETIC PEPTIDE: B Natriuretic Peptide: 92 pg/mL (ref 0.0–100.0)

## 2023-07-10 LAB — AMMONIA: Ammonia: 22 umol/L (ref 9–35)

## 2023-07-10 LAB — GLUCOSE, CAPILLARY: Glucose-Capillary: 210 mg/dL — ABNORMAL HIGH (ref 70–99)

## 2023-07-10 MED ORDER — TIMOLOL MALEATE 0.5 % OP SOLN
1.0000 [drp] | Freq: Two times a day (BID) | OPHTHALMIC | Status: DC
  Administered 2023-07-10 – 2023-08-03 (×48): 1 [drp] via OPHTHALMIC
  Filled 2023-07-10: qty 5

## 2023-07-10 MED ORDER — INSULIN ASPART 100 UNIT/ML IJ SOLN
0.0000 [IU] | Freq: Three times a day (TID) | INTRAMUSCULAR | Status: DC
  Administered 2023-07-11: 8 [IU] via SUBCUTANEOUS
  Administered 2023-07-11: 5 [IU] via SUBCUTANEOUS
  Administered 2023-07-11: 8 [IU] via SUBCUTANEOUS
  Administered 2023-07-12: 5 [IU] via SUBCUTANEOUS
  Administered 2023-07-12: 11 [IU] via SUBCUTANEOUS
  Administered 2023-07-12: 15 [IU] via SUBCUTANEOUS
  Administered 2023-07-13: 3 [IU] via SUBCUTANEOUS
  Administered 2023-07-13: 8 [IU] via SUBCUTANEOUS
  Administered 2023-07-13 – 2023-07-14 (×2): 2 [IU] via SUBCUTANEOUS
  Administered 2023-07-14 (×2): 5 [IU] via SUBCUTANEOUS
  Administered 2023-07-15: 2 [IU] via SUBCUTANEOUS
  Administered 2023-07-15 – 2023-07-16 (×3): 3 [IU] via SUBCUTANEOUS
  Administered 2023-07-16: 2 [IU] via SUBCUTANEOUS
  Administered 2023-07-17 (×2): 3 [IU] via SUBCUTANEOUS
  Administered 2023-07-17 – 2023-07-18 (×2): 2 [IU] via SUBCUTANEOUS
  Administered 2023-07-18 (×2): 3 [IU] via SUBCUTANEOUS
  Administered 2023-07-19: 8 [IU] via SUBCUTANEOUS
  Administered 2023-07-19: 11 [IU] via SUBCUTANEOUS
  Administered 2023-07-20: 2 [IU] via SUBCUTANEOUS
  Administered 2023-07-20: 3 [IU] via SUBCUTANEOUS
  Administered 2023-07-21: 2 [IU] via SUBCUTANEOUS
  Administered 2023-07-23: 5 [IU] via SUBCUTANEOUS
  Administered 2023-07-23: 3 [IU] via SUBCUTANEOUS
  Administered 2023-07-23: 2 [IU] via SUBCUTANEOUS
  Administered 2023-07-24 (×3): 3 [IU] via SUBCUTANEOUS
  Administered 2023-07-25: 5 [IU] via SUBCUTANEOUS
  Administered 2023-07-25 – 2023-07-26 (×2): 3 [IU] via SUBCUTANEOUS
  Administered 2023-07-26: 5 [IU] via SUBCUTANEOUS
  Administered 2023-07-26: 3 [IU] via SUBCUTANEOUS
  Administered 2023-07-27: 5 [IU] via SUBCUTANEOUS
  Administered 2023-07-27: 3 [IU] via SUBCUTANEOUS
  Administered 2023-07-27: 2 [IU] via SUBCUTANEOUS
  Administered 2023-07-28: 11 [IU] via SUBCUTANEOUS
  Administered 2023-07-28: 5 [IU] via SUBCUTANEOUS
  Administered 2023-07-29: 3 [IU] via SUBCUTANEOUS
  Administered 2023-07-29: 5 [IU] via SUBCUTANEOUS
  Administered 2023-07-30 (×2): 3 [IU] via SUBCUTANEOUS
  Administered 2023-07-31: 5 [IU] via SUBCUTANEOUS
  Administered 2023-07-31: 2 [IU] via SUBCUTANEOUS
  Administered 2023-08-01: 8 [IU] via SUBCUTANEOUS
  Administered 2023-08-01 – 2023-08-02 (×2): 2 [IU] via SUBCUTANEOUS
  Administered 2023-08-02 (×2): 3 [IU] via SUBCUTANEOUS

## 2023-07-10 MED ORDER — ONDANSETRON HCL 4 MG/2ML IJ SOLN: 4.0000 mg | Freq: Four times a day (QID) | INTRAMUSCULAR | Status: DC | PRN

## 2023-07-10 MED ORDER — ENOXAPARIN SODIUM 40 MG/0.4ML IJ SOSY
40.0000 mg | PREFILLED_SYRINGE | Freq: Every day | INTRAMUSCULAR | Status: DC
  Administered 2023-07-10 – 2023-07-13 (×4): 40 mg via SUBCUTANEOUS
  Filled 2023-07-10 (×4): qty 0.4

## 2023-07-10 MED ORDER — ALBUTEROL SULFATE (2.5 MG/3ML) 0.083% IN NEBU
2.5000 mg | INHALATION_SOLUTION | RESPIRATORY_TRACT | Status: DC | PRN
  Administered 2023-07-24 – 2023-07-31 (×2): 2.5 mg via RESPIRATORY_TRACT
  Filled 2023-07-10 (×2): qty 3

## 2023-07-10 MED ORDER — POTASSIUM CHLORIDE CRYS ER 20 MEQ PO TBCR
40.0000 meq | EXTENDED_RELEASE_TABLET | Freq: Once | ORAL | Status: AC
  Administered 2023-07-10: 40 meq via ORAL
  Filled 2023-07-10: qty 2

## 2023-07-10 MED ORDER — ACETAMINOPHEN 325 MG PO TABS
650.0000 mg | ORAL_TABLET | Freq: Four times a day (QID) | ORAL | Status: DC | PRN
  Administered 2023-07-26: 650 mg via ORAL
  Filled 2023-07-10: qty 2

## 2023-07-10 MED ORDER — POLYETHYLENE GLYCOL 3350 17 G PO PACK: 17.0000 g | PACK | Freq: Every day | ORAL | Status: DC | PRN

## 2023-07-10 MED ORDER — PANTOPRAZOLE SODIUM 40 MG PO TBEC: 40.0000 mg | DELAYED_RELEASE_TABLET | Freq: Every day | ORAL | Status: DC

## 2023-07-10 MED ORDER — SODIUM CHLORIDE 0.9 % IV SOLN
1.0000 g | Freq: Once | INTRAVENOUS | Status: AC
  Administered 2023-07-10: 1 g via INTRAVENOUS
  Filled 2023-07-10: qty 10

## 2023-07-10 MED ORDER — POTASSIUM CHLORIDE CRYS ER 20 MEQ PO TBCR
40.0000 meq | EXTENDED_RELEASE_TABLET | Freq: Once | ORAL | Status: AC
  Administered 2023-07-11: 40 meq via ORAL
  Filled 2023-07-10: qty 2

## 2023-07-10 MED ORDER — ONDANSETRON HCL 4 MG PO TABS: 4.0000 mg | ORAL_TABLET | Freq: Four times a day (QID) | ORAL | Status: DC | PRN

## 2023-07-10 MED ORDER — ACETAMINOPHEN 650 MG RE SUPP: 650.0000 mg | Freq: Four times a day (QID) | RECTAL | Status: DC | PRN

## 2023-07-10 MED ORDER — BISACODYL 5 MG PO TBEC: 5.0000 mg | DELAYED_RELEASE_TABLET | Freq: Every day | ORAL | Status: DC | PRN

## 2023-07-10 MED ORDER — LOSARTAN POTASSIUM 50 MG PO TABS
50.0000 mg | ORAL_TABLET | Freq: Every day | ORAL | Status: DC
  Administered 2023-07-11: 50 mg via ORAL
  Filled 2023-07-10 (×2): qty 1

## 2023-07-10 MED ORDER — HYDRALAZINE HCL 20 MG/ML IJ SOLN
5.0000 mg | INTRAMUSCULAR | Status: DC | PRN
  Administered 2023-07-17 – 2023-07-19 (×2): 5 mg via INTRAVENOUS
  Filled 2023-07-10 (×2): qty 1

## 2023-07-10 MED ORDER — DOCUSATE SODIUM 100 MG PO CAPS
100.0000 mg | ORAL_CAPSULE | Freq: Two times a day (BID) | ORAL | Status: DC
  Administered 2023-07-10 – 2023-07-28 (×9): 100 mg via ORAL
  Filled 2023-07-10 (×24): qty 1

## 2023-07-10 MED ORDER — OXYCODONE HCL 5 MG PO TABS
5.0000 mg | ORAL_TABLET | ORAL | Status: DC | PRN
  Administered 2023-07-18 – 2023-08-01 (×4): 5 mg via ORAL
  Filled 2023-07-10 (×4): qty 1

## 2023-07-10 MED ORDER — DORZOLAMIDE HCL 2 % OP SOLN
1.0000 [drp] | Freq: Two times a day (BID) | OPHTHALMIC | Status: DC
  Administered 2023-07-10 – 2023-08-03 (×48): 1 [drp] via OPHTHALMIC
  Filled 2023-07-10: qty 10

## 2023-07-10 MED ORDER — SODIUM CHLORIDE 0.9 % IV SOLN
1.0000 g | INTRAVENOUS | Status: DC
  Administered 2023-07-11: 1 g via INTRAVENOUS
  Filled 2023-07-10: qty 10

## 2023-07-10 NOTE — ED Triage Notes (Signed)
Pt BIB EMS and coming from home.  He has increasing weakness since labor day.  Pt is unable to ambulate. Hx of testicular cancer that has spread to the liver.  Pt a/o x 4. Bilateral non pitting edema noted to ankles but this is normal for the patient.  EMS got an  CGB of 350.

## 2023-07-10 NOTE — ED Provider Notes (Signed)
[place] EMERGENCY DEPARTMENT AT [facility] Provider Note   CSN: [id number] Arrival date & time: 07/10/23  1127     History  Chief Complaint  Patient presents with   Weakness    William Jones is a 72 y.o. male.  The history is provided by the patient and medical records. No language interpreter was used.  Weakness Severity:  Moderate Onset quality:  Gradual Duration:  2 days Timing:  Constant Progression:  Worsening Chronicity:  New Relieved by:  Nothing Worsened by:  Nothing Ineffective treatments:  None tried Associated symptoms: no abdominal pain, no chest pain, no cough, no diarrhea, no dizziness, no dysuria, no numbness in extremities, no falls, no fever, no headaches, no loss of consciousness, no nausea, no near-syncope, no sensory-motor deficit, no shortness of breath, no stroke symptoms, no syncope and no vomiting        Home Medications Prior to Admission medications   Medication Sig Start Date End Date Taking? Authorizing Provider  CALCIUM CITRATE PO Take 600 mg by mouth daily.    [provider]  cholecalciferol (VITAMIN D3) 10 MCG (400 UNIT) TABS tablet Take 1,000 Units by mouth daily.    [provider]  dorzolamide (TRUSOPT) 2 % ophthalmic solution Place 1 drop into the right eye 2 (two) times daily.    [provider]  empagliflozin (JARDIANCE) 10 MG TABS tablet Take 1 tablet (10 mg total) by mouth daily before breakfast. 06/24/23   Monna Fam, MD  feeding supplement (ENSURE ENLIVE / ENSURE PLUS) LIQD Take 237 mLs by mouth 3 (three) times daily between meals. 07/05/23   Morrie Sheldon, MD  Ferrous Sulfate (IRON PO) Take 1 tablet by mouth daily.    [provider]  hydrochlorothiazide (HYDRODIURIL) 12.5 MG tablet Take 1 tablet (12.5 mg total) by mouth daily. 07/06/23   Morrie Sheldon, MD  losartan (COZAAR) 50 MG tablet Take 1 tablet (50 mg total) by mouth daily. 07/06/23   Morrie Sheldon, MD  Multiple  Vitamins-Minerals (ONE-A-DAY PROACTIVE 65+) TABS Take 1 tablet by mouth daily.    [provider]  pantoprazole (PROTONIX) 40 MG tablet Take 1 tablet (40 mg total) by mouth daily. 06/24/23 06/23/24  Monna Fam, MD  potassium chloride (MICRO-K) 10 MEQ CR capsule Take 2 capsules (20 mEq total) by mouth daily. 07/08/23   Rachel Moulds, MD  timolol (BETIMOL) 0.5 % ophthalmic solution Place 1 drop into the right eye 2 (two) times daily.    [provider]  vitamin E 180 MG (400 UNITS) capsule Take 400 Units by mouth daily.    [provider]  XTANDI 40 MG capsule Take 160 mg by mouth at bedtime. 11/19/19   [provider]      Allergies    Patient has no known allergies.    Review of Systems   Review of Systems  Constitutional:  Positive for fatigue. Negative for chills and fever.  HENT:  Negative for congestion.   Eyes:  Negative for visual disturbance.  Respiratory:  Negative for cough, chest tightness, shortness of breath, wheezing and stridor.   Cardiovascular:  Positive for leg swelling (chronic). Negative for chest pain, palpitations, syncope and near-syncope.  Gastrointestinal:  Negative for abdominal pain, constipation, diarrhea, nausea and vomiting.  Genitourinary:  Negative for dysuria and flank pain.  Musculoskeletal:  Negative for back pain, falls, neck pain and neck stiffness.  Skin:  Negative for rash and wound.  Neurological:  Positive for weakness. Negative for dizziness,  loss of consciousness, light-headedness and headaches.  Psychiatric/Behavioral:  Negative for agitation and confusion.   All other systems reviewed and are negative.   Physical Exam Updated Vital Signs BP (!) 151/94 (BP Location: Left Arm)   Pulse 85   Temp (!) 97.4 F (36.3 C) (Oral)   Resp 16   Ht 5\' 9"  (1.753 m)   Wt 68 kg   SpO2 100%   BMI 22.15 kg/m  Physical Exam Vitals and nursing note reviewed.  Constitutional:      General: He is not in acute  distress.    Appearance: He is well-developed. He is not ill-appearing, toxic-appearing or diaphoretic.  HENT:     Head: Normocephalic and atraumatic.     Nose: No congestion or rhinorrhea.     Mouth/Throat:     Pharynx: No oropharyngeal exudate or posterior oropharyngeal erythema.  Eyes:     Extraocular Movements: Extraocular movements intact.     Conjunctiva/sclera: Conjunctivae normal.     Pupils: Pupils are equal, round, and reactive to light.  Cardiovascular:     Rate and Rhythm: Normal rate and regular rhythm.     Heart sounds: No murmur heard. Pulmonary:     Effort: Pulmonary effort is normal. No respiratory distress.     Breath sounds: Normal breath sounds. No stridor. No wheezing, rhonchi or rales.  Chest:     Chest wall: No tenderness.  Abdominal:     General: Abdomen is flat. There is no distension.     Palpations: Abdomen is soft.     Tenderness: There is no abdominal tenderness. There is no right CVA tenderness, left CVA tenderness, guarding or rebound.  Musculoskeletal:        General: No swelling or tenderness.     Cervical back: Neck supple. No tenderness.     Right lower leg: Edema present.     Left lower leg: Edema present.  Skin:    General: Skin is warm and dry.     Capillary Refill: Capillary refill takes less than 2 seconds.     Findings: No erythema or rash.  Neurological:     General: No focal deficit present.     Mental Status: He is alert.  Psychiatric:        Mood and Affect: Mood normal.     ED Results / Procedures / Treatments   Labs (all labs ordered are listed, but only abnormal results are displayed) Labs Reviewed  CBC WITH DIFFERENTIAL/PLATELET - Abnormal; Notable for the following components:      Result Value   RBC 3.49 (*)    Hemoglobin 10.8 (*)    HCT 32.0 (*)    Lymphs Abs 0.2 (*)    All other components within normal limits  COMPREHENSIVE METABOLIC PANEL - Abnormal; Notable for the following components:   Potassium 3.0 (*)     Chloride 96 (*)    Glucose, Bld 267 (*)    BUN 26 (*)    Calcium 7.4 (*)    Total Protein 5.9 (*)    Albumin 2.9 (*)    AST 50 (*)    ALT 90 (*)    Anion gap 16 (*)    All other components within normal limits  URINALYSIS, W/ REFLEX TO CULTURE (INFECTION SUSPECTED) - Abnormal; Notable for the following components:   Glucose, UA >=500 (*)    Hgb urine dipstick SMALL (*)    Ketones, ur 20 (*)    Nitrite POSITIVE (*)    Bacteria,  UA MANY (*)    All other components within normal limits  RESP PANEL BY RT-PCR (RSV, FLU A&B, COVID)  RVPGX2  LACTIC ACID, PLASMA  LACTIC ACID, PLASMA  PROTIME-INR  AMMONIA  TSH  BRAIN NATRIURETIC PEPTIDE    EKG EKG Interpretation Date/Time:  Sunday July 10 2023 13:43:34 EDT Ventricular Rate:  85 PR Interval:  154 QRS Duration:  90 QT Interval:  392 QTC Calculation: 467 R Axis:   33  Text Interpretation: Sinus rhythm Probable left atrial enlargement Borderline T abnormalities, inferior leads when compared to prior, similar appearance; No STEMI Confirmed by Theda Belfast ([id number]) on 07/10/2023 4:32:13 PM  Radiology DG Chest Portable 1 View  Result Date: 07/10/2023 CLINICAL DATA:  Fatigue EXAM: PORTABLE CHEST 1 VIEW COMPARISON:  X-ray 06/29/2023 and older.  CT scan 06/30/2023 FINDINGS: Film is rotated to the left. Slight linear opacity left lung base likely scar or atelectasis. No consolidation, pneumothorax or effusion. No edema. Overlapping cardiac leads. Normal cardiopericardial silhouette. IMPRESSION: Slight linear opacity left lung base likely scar or atelectasis. Rotated radiograph. Electronically Signed   By: Karen Kays M.D.   On: 07/10/2023 15:26    Procedures Procedures    Medications Ordered in ED Medications  cefTRIAXone (ROCEPHIN) 1 g in sodium chloride 0.9 % 100 mL IVPB (has no administration in time range)  potassium chloride SA (KLOR-CON M) CR tablet 40 mEq (has no administration in time range)    ED Course/ Medical Decision  Making/ A&P                                 Medical Decision Making Amount and/or Complexity of Data Reviewed Labs: ordered. Radiology: ordered.  Risk Prescription drug management. Decision regarding hospitalization.    SIRAJ DERMODY is a 72 y.o. male with a past medical history significant for hypertension, hyperlipidemia, previous gastric ulcer, and prostate cancer with metastasis to bone and liver who presents for worsening fatigue and generalized weakness.  According to patient, he is scheduled to have a radiation treatment at this facility tomorrow at the cancer center but over the last 2 days he has had worsening fatigue and generalized weakness and was worried he would not make it if he waits until tomorrow.  He said he is had more and more generalized with fatigue and weakness and will starting have difficulty with ambulation.  He reports he is eating and drinking fairly well, denies nausea, vomiting, constipation, or diarrhea.  Denies any chest pain, shortness breath, or palpitations.  Denies any new abdominal pain chest pain back or flank pain.  Denies any new headache or neck pain.  Denies other neurologic complaints.  Just feels drained and tired.  Denies medication changes otherwise.  On exam, lungs clear.  Chest nontender.  Abdomen nontender.  Patient moving all extremities.  Legs are edematous but he reports this is chronic for him and not significantly changed.  Good pulses in extremities.  No focal neurologic deficits initially.  Patient just feeling tired.  We will start workup to look for electrolyte abnormality, hepatic encephalopathy, occult infection, or other cause of generalized fatigue and worsening generalized weakness.  Anticipate reassessment and ambulation evaluation to make sure there is not a reason he will need to be admitted before his procedure tomorrow.  5:43 PM Workup continues to return.  Patient's labs only seems significant for low potassium, low  calcium, and likely urinary tract infection with nitrites and  many bacteria.  Patient does say on reassessment that he was told he may have a UTI but was not given antibiotics.  He says that his urine has been changing colors recently and has been more dark but he is denying focal dysuria.  He does not a leukocytosis and has similar anemia to prior.  No lactic acidosis and his ammonia was normal.  BNP nonelevated and otherwise patient is well-appearing other than the fatigue.  X-ray did not show pneumonia convincingly given lack of cough.  Given the patient's reported worsening fatigue and is concerned about being able to make it back to the hospital tomorrow and this new evidence of urinary tract infection, will give him IV antibiotics, oral potassium, and call for admission.         Final Clinical Impression(s) / ED Diagnoses Final diagnoses:  Acute cystitis without hematuria  Hypokalemia  Hypocalcemia  Fatigue, unspecified type  Generalized weakness    Clinical Impression: 1. Acute cystitis without hematuria   2. Hypokalemia   3. Hypocalcemia   4. Fatigue, unspecified type   5. Generalized weakness     Disposition: Admit  This note was prepared with assistance of Dragon voice recognition software. Occasional wrong-word or sound-a-like substitutions may have occurred due to the inherent limitations of voice recognition software.      Amea Mcphail, Canary Brim, MD 07/10/23 [phone]

## 2023-07-10 NOTE — H&P (Addendum)
History and Physical    Patient: William Jones WGN:[id number] DOB: 10-27-1950 DOA: 08/01/2023 DOS: the patient was seen and examined on 07/23/2023 PCP: Morene Crocker, MD  Patient coming from: Home - lives with girlfriend; NOK: Girlfriend, [name], [phone]   Chief Complaint: Weakness  HPI: William Jones is a 72 y.o. male with medical history significant of HTN, glaucoma, HLD, and prostate CA presenting with generalized weakness.  He was recently admitted from 9/25-10/1 with generalized weakness.  During that admission, Xtandi for metastatic prostate cancer was held since this can exacerbate weakness.  He was noted to have diffuse hepatic metastatic disease as well as B adrenal mets.  Liver biopsy showed SCC, unsure if primary vs. Metastatic.  He was seen by Dr. Al Pimple on 10/3 and is planning to start [place] etoposide with first dose on 10/8.  He was noted to have hypokalemia and was sent in KCl 20 mEq BID x 5 days and then 20 mEq daily.  He reports that he was doing ok at home, walking and independent but became acutely weak and was unable to function so came in.  He thinks he needs to stay in the hospital through his treatment since it is too hard for him to function at home in his 2nd floor apartment and his girlfriend can't take care of him.  He has no other specific complaints.  He is full code and wants everything possible done.    ER Course:  Due for radiation tomorrow, more fatigue the last few days, too tired to get up.  Afraid he couldn't make it to rad onc tomorrow.  ?UTI, no symptoms of UTI.       Review of Systems: As mentioned in the history of present illness. All other systems reviewed and are negative. Past Medical History:  Diagnosis Date   Angiodysplasia of colon with hemorrhage 10/14/2020   Duodenitis    Essential hypertension 07/16/2015   Fatigue 11/06/2020   Glaucoma    H/O: substance abuse (HCC)    Hemorrhage of rectum and anus  10/14/2020   History of frostbite    History of prostate cancer 03/20/2014   Lost to f/u with [facility] in the past 2011 with elevated PSA >30 at that time.  Saw [facility] (Dr. Isabel Caprice) on 03/13/14. Cancer Noted 03/13/14 office visit. Gleason score 7. Plan CT Ab/pelvis with contrast, bone scan    Hyperlipidemia 11/20/2019   Iron deficiency anemia    Malignant tumor of prostate (HCC) 10/14/2020   Peripheral neuropathy    S/P radiation therapy 09/18/14-11/15/14   prostate/ 7800Gy/40sessions   Thrombocytopenia [facility])    Past Surgical History:  Procedure Laterality Date   BIOPSY  06/11/2023   Procedure: BIOPSY;  Surgeon: Benancio Deeds, MD;  Location: [facility] ENDOSCOPY;  Service: Gastroenterology;;   COLONOSCOPY N/A 01/10/2014   Procedure: COLONOSCOPY;  Surgeon: Theda Belfast, MD;  Location: [facility] ENDOSCOPY;  Service: Endoscopy;  Laterality: N/A;   ESOPHAGOGASTRODUODENOSCOPY N/A 01/10/2014   Procedure: ESOPHAGOGASTRODUODENOSCOPY (EGD);  Surgeon: Theda Belfast, MD;  Location: [facility] ENDOSCOPY;  Service: Endoscopy;  Laterality: N/A;   ESOPHAGOGASTRODUODENOSCOPY (EGD) WITH PROPOFOL N/A 06/11/2023   Procedure: ESOPHAGOGASTRODUODENOSCOPY (EGD) WITH PROPOFOL;  Surgeon: Benancio Deeds, MD;  Location: [facility] ENDOSCOPY;  Service: Gastroenterology;  Laterality: N/A;   FLEXIBLE SIGMOIDOSCOPY N/A 09/19/2015   Procedure: FLEXIBLE SIGMOIDOSCOPY;  Surgeon: Jeani Hawking, MD;  Location: [facility] ENDOSCOPY;  Service: Endoscopy;  Laterality: N/A;   FLEXIBLE SIGMOIDOSCOPY N/A 06/03/2017   Procedure: FLEXIBLE SIGMOIDOSCOPY;  Surgeon: Jeani Hawking, MD;  Location: [facility] ENDOSCOPY;  Service: Endoscopy;  Laterality: N/A;   FLEXIBLE SIGMOIDOSCOPY N/A 01/06/2018   Procedure: FLEXIBLE SIGMOIDOSCOPY;  Surgeon: Jeani Hawking, MD;  Location: [facility] ENDOSCOPY;  Service: Endoscopy;  Laterality: N/A;   GIVENS CAPSULE STUDY N/A 01/10/2014   Procedure: GIVENS CAPSULE STUDY;  Surgeon: Theda Belfast, MD;  Location: [facility] ENDOSCOPY;  Service:  Endoscopy;  Laterality: N/A;   HOT HEMOSTASIS N/A 09/19/2015   Procedure: HOT HEMOSTASIS (ARGON PLASMA COAGULATION/BICAP);  Surgeon: Jeani Hawking, MD;  Location: [facility] ENDOSCOPY;  Service: Endoscopy;  Laterality: N/A;   HOT HEMOSTASIS N/A 06/03/2017   Procedure: HOT HEMOSTASIS (ARGON PLASMA COAGULATION/BICAP);  Surgeon: Jeani Hawking, MD;  Location: [facility] ENDOSCOPY;  Service: Endoscopy;  Laterality: N/A;   PROSTATE BIOPSY  03/05/14   Gleason 7, vol 45 gm   Social History:  reports that he quit smoking about 15 years ago. His smoking use included cigarettes. He started smoking about 45 years ago. He has a 15 pack-year smoking history. He has never used smokeless tobacco. He reports that he does not currently use alcohol. He reports that he does not use drugs.  Allergies  Allergen Reactions   Metformin And Related Other (See Comments)    Bad headaches   Pantoprazole Other (See Comments)    "Made me see things. I cannot function while taking this."    Family History  Problem Relation Age of Onset   Kidney failure Brother 36       has been on HD since age 52    Edema Mother        Legs   Arthritis Sister        knees   Stroke Maternal Uncle        70s-80s   Cancer Neg Hx     Prior to Admission medications   Medication Sig Start Date End Date Taking? Authorizing Provider  CALCIUM CITRATE PO Take 600 mg by mouth daily.    [provider]  cholecalciferol (VITAMIN D3) 10 MCG (400 UNIT) TABS tablet Take 1,000 Units by mouth daily.    [provider]  dorzolamide (TRUSOPT) 2 % ophthalmic solution Place 1 drop into the right eye 2 (two) times daily.    [provider]  empagliflozin (JARDIANCE) 10 MG TABS tablet Take 1 tablet (10 mg total) by mouth daily before breakfast. 06/24/23   Monna Fam, MD  feeding supplement (ENSURE ENLIVE / ENSURE PLUS) LIQD Take 237 mLs by mouth 3 (three) times daily between meals. 07/05/23   Morrie Sheldon, MD  Ferrous Sulfate (IRON PO) Take  1 tablet by mouth daily.    [provider]  hydrochlorothiazide (HYDRODIURIL) 12.5 MG tablet Take 1 tablet (12.5 mg total) by mouth daily. 07/06/23   Morrie Sheldon, MD  losartan (COZAAR) 50 MG tablet Take 1 tablet (50 mg total) by mouth daily. 07/06/23   Morrie Sheldon, MD  Multiple Vitamins-Minerals (ONE-A-DAY PROACTIVE 65+) TABS Take 1 tablet by mouth daily.    [provider]  pantoprazole (PROTONIX) 40 MG tablet Take 1 tablet (40 mg total) by mouth daily. 06/24/23 06/23/24  Monna Fam, MD  potassium chloride (MICRO-K) 10 MEQ CR capsule Take 2 capsules (20 mEq total) by mouth daily. 07/08/23   Rachel Moulds, MD  timolol (BETIMOL) 0.5 % ophthalmic solution Place 1 drop into the right eye 2 (two) times daily.    [provider]  vitamin E 180 MG (400 UNITS) capsule Take 400 Units by mouth daily.  [provider]  XTANDI 40 MG capsule Take 160 mg by mouth at bedtime. 11/19/19   [provider]    Physical Exam: Vitals:   07/20/2023 1845 07/18/2023 1900 07/08/2023 1915 07/20/2023 1935  BP: (!) 141/114 (!) 143/89 (!) 147/92 (!) 166/90  Pulse: 82 86 89 82  Resp: 14 17 (!) 23 18  Temp:   97.7 F (36.5 C) 98.3 F (36.8 C)  TempSrc:   Oral Oral  SpO2: 100% 100% 100% 100%  Weight:      Height:       General:  Appears calm and comfortable and is in NAD Eyes:  EOMI, normal lids, iris ENT:  grossly normal hearing, lips & tongue, mmm; some absent dentition Neck:  no LAD, masses or thyromegaly Cardiovascular:  RRR, no m/r/g. 3+ pitting LE edema.  Respiratory:   CTA bilaterally with no wheezes/rales/rhonchi.  Normal respiratory effort. Abdomen:  soft, NT, ND Skin:  no rash or induration seen on limited exam Musculoskeletal:  grossly normal tone BUE/BLE, good ROM, no bony abnormality Psychiatric:  grossly normal mood and affect, speech fluent and appropriate, AOx3 Neurologic:  CN 2-12 grossly intact, moves all extremities in coordinated  fashion   Radiological Exams on Admission: Independently reviewed - see discussion in A/P where applicable  DG Chest Portable 1 View  Result Date: 07/26/2023 CLINICAL DATA:  Fatigue EXAM: PORTABLE CHEST 1 VIEW COMPARISON:  X-ray 06/29/2023 and older.  CT scan 06/30/2023 FINDINGS: Film is rotated to the left. Slight linear opacity left lung base likely scar or atelectasis. No consolidation, pneumothorax or effusion. No edema. Overlapping cardiac leads. Normal cardiopericardial silhouette. IMPRESSION: Slight linear opacity left lung base likely scar or atelectasis. Rotated radiograph. Electronically Signed   By: Karen Kays M.D.   On: 07/14/2023 15:26    EKG: Independently reviewed.  NSR with rate 85; nonspecific ST changes with no evidence of acute ischemia   Labs on Admission: I have personally reviewed the available labs and imaging studies at the time of the admission.  Pertinent labs:    K+ 3.0 Glucose 267 BUN 26 Anion gap 16 Albumin 2.9 AST 50/ALT 90 Lactate normal WBC 6.1 Hgb 10.8 TSH 1.073 UA: >500 glucose, small Hgb, 20 ketones, + nitrite, many bacteria   Assessment and Plan: Principal Problem:   Generalized weakness Active Problems:   Essential hypertension   Glaucoma   Prostate cancer metastatic to bone (HCC)   Type 2 diabetes mellitus (HCC)   Anemia of chronic disease   Malignant small cell cancer (HCC)   UTI (urinary tract infection)   Hypokalemia    Generalized weakness Previously hospitalized last week for the same He has metastatic CA and may need placement No obvious acute needs for hospitalization currently other than possible UTI Will treat with Rocephin for UTI and order PT/OT and TOC consults for possible placement Continue to hold Xtandi since this can be associated with fatigue  Possible UTI No symptoms but abnormal UA and generalized weakness Received Ceftriaxone in the ER, will continue F/u culture  Prostate CA, small cell  carcinoma Prior diagnosis of prostate adenocarcinoma, on Xgeva, Xtandi (on hold), and Lupron Recent scans with diffuse hepatic mets, adrenal mets Elevated LFTs are somewhat improved, likely related to this issue Biopsy with small cell CA, uncertain primary Planning to start treatment on 10/8 PET with high concern for skeletal mets in T10 vertebral body Will add Dr. Al Pimple to the treatment team at this time He is currently full code; GOC conversations  should be held on an ongoing basis  BLE edema H/o hypoalbuminemia Also with apparently widespread malignancy, possibly lymphedema Will add BLE compression stockings  Hypokalemia This may also be contributing to generalized weakness Will replete  Anemia Normocytic, likely related to anemia of chronic disease Will trend Transfusion goal is >7  DM A1c 6.5 recently, good control Hold Jardiance while inpatient Continue glargine Will cover with moderate-scale SSI  H/o GI bleed due to gastric ulcer Continue Protonix (not taking at home)  HTN Continue losartan Hold hydrochlorothiazide, consider dc given recurrent hypokalemia    Advance Care Planning:   Code Status: Full Code - Code status was discussed with the patient and/or family at the time of admission.  The patient would want to receive full resuscitative measures at this time.   Consults: OT/PT; TOC team; nutrition  DVT Prophylaxis: Lovenox  Family Communication: None present  Severity of Illness: The appropriate patient status for this patient is OBSERVATION. Observation status is judged to be reasonable and necessary in order to provide the required intensity of service to ensure the patient's safety. The patient's presenting symptoms, physical exam findings, and initial radiographic and laboratory data in the context of their medical condition is felt to place them at decreased risk for further clinical deterioration. Furthermore, it is anticipated that the patient will  be medically stable for discharge from the hospital within 2 midnights of admission.   Author: Jonah Blue, MD 07/30/2023 8:11 PM  For on call review [email or web address].

## 2023-07-11 ENCOUNTER — Other Ambulatory Visit: Payer: 59

## 2023-07-11 ENCOUNTER — Encounter (HOSPITAL_COMMUNITY): Payer: Self-pay | Admitting: Internal Medicine

## 2023-07-11 ENCOUNTER — Inpatient Hospital Stay: Payer: 59 | Admitting: Hematology and Oncology

## 2023-07-11 DIAGNOSIS — R531 Weakness: Secondary | ICD-10-CM | POA: Diagnosis not present

## 2023-07-11 DIAGNOSIS — E440 Moderate protein-calorie malnutrition: Secondary | ICD-10-CM | POA: Insufficient documentation

## 2023-07-11 LAB — BASIC METABOLIC PANEL
Anion gap: 14 (ref 5–15)
BUN: 19 mg/dL (ref 8–23)
CO2: 25 mmol/L (ref 22–32)
Calcium: 7.3 mg/dL — ABNORMAL LOW (ref 8.9–10.3)
Chloride: 99 mmol/L (ref 98–111)
Creatinine, Ser: 0.64 mg/dL (ref 0.61–1.24)
GFR, Estimated: >60 mL/min (ref >60)
Glucose, Bld: 344 mg/dL — ABNORMAL HIGH (ref 70–99)
Potassium: 3.8 mmol/L (ref 3.5–5.1)
Sodium: 138 mmol/L (ref 135–145)

## 2023-07-11 LAB — CBC
HCT: 31.6 % — ABNORMAL LOW (ref 39.0–52.0)
Hemoglobin: 10.2 g/dL — ABNORMAL LOW (ref 13.0–17.0)
MCH: 30.5 pg (ref 26.0–34.0)
MCHC: 32.3 g/dL (ref 30.0–36.0)
MCV: 94.6 fL (ref 80.0–100.0)
Platelets: 164 10*3/uL (ref 150–400)
RBC: 3.34 MIL/uL — ABNORMAL LOW (ref 4.22–5.81)
RDW: 15.5 % (ref 11.5–15.5)
WBC: 6.6 10*3/uL (ref 4.0–10.5)
nRBC: 0 % (ref 0.0–0.2)

## 2023-07-11 LAB — GLUCOSE, CAPILLARY
Glucose-Capillary: 277 mg/dL — ABNORMAL HIGH (ref 70–99)
Glucose-Capillary: 238 mg/dL — ABNORMAL HIGH (ref 70–99)
Glucose-Capillary: 292 mg/dL — ABNORMAL HIGH (ref 70–99)
Glucose-Capillary: 248 mg/dL — ABNORMAL HIGH (ref 70–99)

## 2023-07-11 MED ORDER — ENSURE ENLIVE PO LIQD: 237.0000 mL | Freq: Two times a day (BID) | ORAL | Status: DC

## 2023-07-11 MED ORDER — GLUCERNA SHAKE PO LIQD
237.0000 mL | Freq: Two times a day (BID) | ORAL | Status: DC
  Administered 2023-07-13 – 2023-07-16 (×3): 237 mL via ORAL
  Filled 2023-07-11 (×16): qty 237

## 2023-07-11 NOTE — Assessment & Plan Note (Signed)
Today, acutely worse, tachycardic, altered, lactate >9, troponins up, INR 1.5.  Having urinary frequency but no chest pain, no dyspnea, no cough.    - Urine cultures Klebsiella --> broaden to ESBL coverage - LFTs up --> obtain abdomen imaging and add vancomycin - Stat CTA chest - Given adrenal meds --> start steroids - Trend lactate

## 2023-07-11 NOTE — Plan of Care (Signed)

## 2023-07-11 NOTE — Inpatient Diabetes Management (Signed)
Inpatient Diabetes Program Recommendations  AACE/ADA: New Consensus Statement on Inpatient Glycemic Control (2015)  Target Ranges:  Prepandial:   less than 140 mg/dL      Peak postprandial:   less than 180 mg/dL (1-2 hours)      Critically ill patients:  140 - 180 mg/dL   Lab Results  Component Value Date   GLUCAP 238 (H) 07/11/2023   HGBA1C 6.5 (A) 06/24/2023    Review of Glycemic Control  Latest Reference Range & Units 07/10/23 19:53 07/11/23 08:03 07/11/23 12:16  Glucose-Capillary 70 - 99 mg/dL 440 (H) 102 (H) 725 (H)   Diabetes history: DM 2 Outpatient Diabetes medications: Jardiance 10 mg Daily Current orders for Inpatient glycemic control:  Novolog 0-15 units tid  Inpatient Diabetes Program Recommendations:    -   start Semglee 10 units -   Add Novolog hs scale  Thanks,  Christena Deem RN, MSN, BC-ADM Inpatient Diabetes Coordinator Team Pager [phone] (8a-5p)

## 2023-07-11 NOTE — Evaluation (Addendum)
Physical Therapy Evaluation Patient Details Name: William Jones MRN: [id number] DOB: 27-Mar-1951 Today's Date: 07/11/2023  History of Present Illness  Patient is a 72 year old male who presented with generalized weakness. Patient was admitted with possible UTI. PMH: prostate cancer small cell carcinoma, BLE edema, DM, GI bleed secondary to gastric ulcer.  Clinical Impression     Pt admitted with above diagnosis.  Pt currently with functional limitations due to the deficits listed below (see PT Problem List). Pt in bed when therapist arrived. Pt agreeable to therapy intervention. Pt reported feeling weak and stated light headedness like he was "high" when transitioning from supine to sit with increased time and CGA. Bp assessed please see below. Pt required CGA and cues for safety with sit to stand with RW and CGA for transfer tasks commode and recliner. Pt able to amb short distance in room 12 and 10 feet with noted discontinuous shuffling pattern. Pt presented with B LE edema and blisters on dorsal surface of feet with R foot cool in comparison to L. Pt agreeable to remaining OOB and left seated in recliner with all needs in place.  Pt will benefit from acute skilled PT to increase their independence and safety with mobility to allow discharge.  Bp seated EOB 161/90 (90 PR) Bp immediate standing 169/111 (116 PR) Bp s/p 3 mins standing 161/108) (105 PR)     If plan is discharge home, recommend the following: A little help with walking and/or transfers;A little help with bathing/dressing/bathroom;Assistance with cooking/housework;Assist for transportation;Help with stairs or ramp for entrance   Can travel by private vehicle        Equipment Recommendations Other (comment);Rolling walker (2 wheels) (TBD pending pt progress)  Recommendations for Other Services       Functional Status Assessment Patient has had a recent decline in their functional status and demonstrates the ability to  make significant improvements in function in a reasonable and predictable amount of time.     Precautions / Restrictions Precautions Precautions: Fall Restrictions Weight Bearing Restrictions: No      Mobility  Bed Mobility Overal bed mobility: Needs Assistance Bed Mobility: Sit to Supine       Sit to supine: HOB elevated, Used rails, Contact guard assist   General bed mobility comments: increased time, cues for IND and use of bed rails, no bed rails PLOF    Transfers Overall transfer level: Needs assistance Equipment used: Rolling walker (2 wheels) Transfers: Sit to/from Stand Sit to Stand: Contact guard assist, From elevated surface           General transfer comment: cues for proper UE placement and noted postural swaying with initial standing balance    Ambulation/Gait Ambulation/Gait assistance: Contact guard assist Gait Distance (Feet): 12 Feet Assistive device: None Gait Pattern/deviations: Step-through pattern, Decreased step length - right, Decreased step length - left, Narrow base of support Gait velocity: decreased     General Gait Details: shuffling pattern with pt electing to don personal slippers vs gripper socks due to B distal LE edema, pt demonstrated discontinous pattern with 2 episodes of LOB/stagering with pt reaching for door frame of bathroom. PT had set up for use of RW and pt elected to step around AD indicating he does not use it at home and ed provided on pt presentation to ED secondary to weakness and use of AD for support and energy conservation  Careers information officer  Tilt Bed    Modified Rankin (Stroke Patients Only)       Balance Overall balance assessment: Needs assistance Sitting-balance support: Feet supported Sitting balance-Leahy Scale: Fair     Standing balance support: During functional activity, No upper extremity supported Standing balance-Leahy Scale: Fair                                Pertinent Vitals/Pain Pain Assessment Pain Assessment: No/denies pain    Home Living Family/patient expects to be discharged to:: Private residence Living Arrangements: Spouse/significant other Available Help at Discharge: Family;Available PRN/intermittently Type of Home: Apartment Home Access: Stairs to enter Entrance Stairs-Rails: Right;Left Entrance Stairs-Number of Steps: 10   Home Layout: One level Home Equipment: None      Prior Function Prior Level of Function : Independent/Modified Independent;Working/employed;Driving             Mobility Comments: ambulates without AD ADLs Comments: reported being independent in ADLs at home. reported girlfriend is unable to help him     Extremity/Trunk Assessment        Lower Extremity Assessment Lower Extremity Assessment: Generalized weakness    Cervical / Trunk Assessment Cervical / Trunk Assessment: Normal  Communication   Communication Communication: No apparent difficulties  Cognition Arousal: Alert Behavior During Therapy: WFL for tasks assessed/performed Overall Cognitive Status: Within Functional Limits for tasks assessed                                 General Comments: some stuttering wiht speech. overall appropriate. appeared to have some decreased insight to deficits, occationally difficult to redirect        General Comments General comments (skin integrity, edema, etc.): Noted B distal LE edema and blisters on dorsal aspect of B feet, reports of dizziness wtih supine to sit and sit to stand, BP assessed    Exercises     Assessment/Plan    PT Assessment Patient needs continued PT services  PT Problem List Decreased strength;Decreased activity tolerance;Decreased balance;Decreased mobility;Decreased coordination       PT Treatment Interventions DME instruction;Gait training;Stair training;Functional mobility training;Therapeutic activities;Therapeutic exercise;Balance  training;Patient/family education    PT Goals (Current goals can be found in the Care Plan section)  Acute Rehab PT Goals Patient Stated Goal: to be able to move and get stronger PT Goal Formulation: With patient Time For Goal Achievement: 07/25/23 Potential to Achieve Goals: Good    Frequency Min 1X/week     Co-evaluation PT/OT/SLP Co-Evaluation/Treatment: Yes Reason for Co-Treatment: Complexity of the patient's impairments (multi-system involvement);Necessary to address cognition/behavior during functional activity PT goals addressed during session: Mobility/safety with mobility;Balance;Proper use of DME OT goals addressed during session: ADL's and self-care;Proper use of Adaptive equipment and DME       AM-PAC PT "6 Clicks" Mobility  Outcome Measure Help needed turning from your back to your side while in a flat bed without using bedrails?: None Help needed moving from lying on your back to sitting on the side of a flat bed without using bedrails?: A Little Help needed moving to and from a bed to a chair (including a wheelchair)?: A Little Help needed standing up from a chair using your arms (e.g., wheelchair or bedside chair)?: A Little Help needed to walk in hospital room?: A Little Help needed climbing 3-5 steps with a railing? : A Lot 6 Click Score: 18  End of Session Equipment Utilized During Treatment: Gait belt Activity Tolerance: Patient limited by fatigue Patient left: in chair;with call bell/phone within reach;with chair alarm set Nurse Communication: Mobility status PT Visit Diagnosis: Unsteadiness on feet (R26.81);Other abnormalities of gait and mobility (R26.89);Muscle weakness (generalized) (M62.81);Difficulty in walking, not elsewhere classified (R26.2)    Time: 5409-8119 PT Time Calculation (min) (ACUTE ONLY): 24 min   Charges:   PT Evaluation $PT Eval Low Complexity: 1 Low   PT General Charges $$ ACUTE PT VISIT: 1 Visit         Johnny Bridge,  PT Acute Rehab    Jacqualyn Posey 07/11/2023, 2:40 PM

## 2023-07-11 NOTE — Assessment & Plan Note (Addendum)
Resolved

## 2023-07-11 NOTE — Hospital Course (Signed)
William Jones is a 72 year old M with HTN, HLD, and prostate cancer, recently admitted for generalized weakness, found to have widely metastatic liver and bone disease.  Was discharged home, but the weakness returned on resuming Xtandi, so he returned to the hospital

## 2023-07-11 NOTE — Assessment & Plan Note (Addendum)
Hgb slightly down.  No GI bleeding noted clinically. - FOBT if BM

## 2023-07-11 NOTE — Assessment & Plan Note (Addendum)
Hypoalbuminemia As evidenced by moderate loss of subcutaneous muscle mass and fat - Continue Glucerna - Consult dietitian

## 2023-07-11 NOTE — Assessment & Plan Note (Signed)
Continue eyedrops 

## 2023-07-11 NOTE — Assessment & Plan Note (Signed)
-   Continue Rocephin 

## 2023-07-11 NOTE — TOC Initial Note (Addendum)
Transition of Care [facility]) - Initial/Assessment Note    Patient Details  Name: William Jones MRN: [id number] Date of Birth: 1951-04-09  Transition of Care [facility]) CM/SW Contact:    Harriett Sine, RN Phone Number:[phone]  07/11/2023, 3:13 PM  Clinical Narrative:                 Pt from home with significant other. Pt has pcp. Spoke with pt at bedside about SNF rehab and d/c plans. Pt states he has thought about SNF for rehab. PASRR # [id number] A , [facility] started and bed request faxed out. TOC following  Expected Discharge Plan: Skilled Nursing Facility Barriers to Discharge: No Barriers Identified   Patient Goals and CMS Choice Patient states their goals for this hospitalization and ongoing recovery are:: to get his strenght back CMS Medicare.gov Compare Post Acute Care list provided to:: Patient Choice offered to / list presented to : Patient      Expected Discharge Plan and Services In-house Referral: NA Discharge Planning Services: NA Post Acute Care Choice: Skilled Nursing Facility Living arrangements for the past 2 months: Apartment                                      Prior Living Arrangements/Services Living arrangements for the past 2 months: Apartment Lives with:: Self, Significant Other Patient language and need for interpreter reviewed:: Yes        Need for Family Participation in Patient Care: Yes (Comment) Care giver support system in place?: Yes (comment) Current home services:  (none) Criminal Activity/Legal Involvement Pertinent to Current Situation/Hospitalization: No - Comment as needed  Activities of Daily Living   ADL Screening (condition at time of admission) Independently performs ADLs?: No Does the patient have a NEW difficulty with bathing/dressing/toileting/self-feeding that is expected to last >3 days?: Yes (Initiates electronic notice to provider for possible OT consult) Does the patient have a NEW difficulty with getting in/out  of bed, walking, or climbing stairs that is expected to last >3 days?: Yes (Initiates electronic notice to provider for possible PT consult) Does the patient have a NEW difficulty with communication that is expected to last >3 days?: No Is the patient deaf or have difficulty hearing?: No Does the patient have difficulty seeing, even when wearing glasses/contacts?: No Does the patient have difficulty concentrating, remembering, or making decisions?: No  Permission Sought/Granted Permission sought to share information with : Facility Industrial/product designer granted to share information with : Yes, Verbal Permission Granted              Emotional Assessment Appearance:: Appears stated age Attitude/Demeanor/Rapport: Engaged Affect (typically observed): Accepting, Calm Orientation: : Oriented to Self, Oriented to Place, Oriented to  Time, Oriented to Situation Alcohol / Substance Use: Other (comment) (quit smoking 15 ago) Psych Involvement: No (comment)  Admission diagnosis:  Hypocalcemia [E83.51] Hypokalemia [E87.6] UTI (urinary tract infection) [N39.0] Acute cystitis without hematuria [N30.00] Generalized weakness [R53.1] Fatigue, unspecified type [R53.83] Patient Active Problem List   Diagnosis Date Noted   UTI (urinary tract infection) 07/27/2023   Hypokalemia 07/16/2023   Malignant small cell cancer (HCC) 07/07/2023   Liver lesion 07/02/2023   Generalized weakness 06/30/2023   Abnormal chest x-ray 06/24/2023   Bilateral lower extremity edema 06/24/2023   Confusion 06/24/2023   Acute gastric ulcer with hemorrhage 06/11/2023   Anemia of chronic disease 06/10/2023   Rash 07/07/2021   First  degree hemorrhoids 10/14/2020   Thrombosed hemorrhoids 10/14/2020   Type 2 diabetes mellitus (HCC) 12/18/2019   Hyperlipidemia 11/20/2019   Prostate cancer metastatic to bone (HCC) 12/29/2018   Glaucoma 08/21/2018   Leukopenia 04/20/2017   Healthcare maintenance 06/23/2016    Essential hypertension 07/16/2015   Erectile dysfunction 01/23/2014   Iron deficiency anemia 01/08/2014   PCP:  Morene Crocker, MD Pharmacy:   [facility] [phone] - [place], [place] - [address] AT [facility] [address] & [address] [place] [id number] Phone: [phone] Fax: [phone]     Social Determinants of Health (SDOH) Social History: SDOH Screenings   Food Insecurity: No Food Insecurity (06/30/2023)  Housing: Patient Declined (06/30/2023)  Transportation Needs: No Transportation Needs (06/30/2023)  Utilities: Not At Risk (06/30/2023)  Alcohol Screen: Low Risk  (10/20/2022)  Depression (PHQ2-9): Low Risk  (06/24/2023)  Financial Resource Strain: Low Risk  (06/25/2020)  Physical Activity: Sufficiently Active (06/25/2020)  Social Connections: Moderately Integrated (06/25/2020)  Stress: No Stress Concern Present (10/20/2022)  Tobacco Use: Medium Risk (07/11/2023)   SDOH Interventions:     Readmission Risk Interventions    07/04/2023    1:50 PM  Readmission Risk Prevention Plan  Transportation Screening Complete  PCP or Specialist Appt within 3-5 Days Complete  HRI or Home Care Consult Complete  Social Work Consult for Recovery Care Planning/Counseling Complete  Palliative Care Screening Complete  Medication Review Oceanographer) Complete

## 2023-07-11 NOTE — Evaluation (Signed)
Occupational Therapy Evaluation Patient Details Name: NORIS HOHMAN MRN: [id number] DOB: September 23, 1951 Today's Date: 07/11/2023   History of Present Illness Patient is a 72 year old male who presented with generalized weakness. Patient was admitted with possible UTI. PMH: prostate cancer small cell carcinoma, BLE edema, DM, GI bleed secondary to gastric ulcer.   Clinical Impression   Patient is a 72 year old male who was admitted for above. Patient was living at home with significant other with independence in ADLs with no AD per patient report. Patient currently was min A for transfers with noted sliding of feet with slippers in place with patient declining to wear gripper socks  "because they cut into my legs" when provided with bariatric blue socks. Patient was CGA for hygiene in bathroom with patient declining to participate in using RW for balance during this session. Concerns raised about patient reporting that "that medication made me feel weird so I didn't want to take it". Patient reported that he stopped taking all meds prior to coming to the hospital because he did not like how they made him feel but did endorse that they were same medications he was taking prior to d/c from hospital. Patient will need 24/7 caregiver support at home and assist for medication management in next level of care.        If plan is discharge home, recommend the following: Direct supervision/assist for medications management;Assist for transportation;Assistance with cooking/housework;Help with stairs or ramp for entrance;Direct supervision/assist for financial management    Functional Status Assessment  Patient has had a recent decline in their functional status and demonstrates the ability to make significant improvements in function in a reasonable and predictable amount of time.  Equipment Recommendations  None recommended by OT       Precautions / Restrictions Precautions Precautions:  Fall Restrictions Weight Bearing Restrictions: No      Mobility Bed Mobility Overal bed mobility: Needs Assistance Bed Mobility: Sit to Supine       Sit to supine: HOB elevated, Used rails, Contact guard assist   General bed mobility comments: increased time, cues for IND and use of bed rails, no bed rails PLOF    Transfers Overall transfer level: Needs assistance Equipment used: Rolling walker (2 wheels) Transfers: Sit to/from Stand Sit to Stand: Contact guard assist           General transfer comment: cues for proper UE placement and noted postural swaying with initial standing balance      Balance Overall balance assessment: Needs assistance Sitting-balance support: Feet supported Sitting balance-Leahy Scale: Fair   Postural control: Posterior lean Standing balance support: During functional activity, No upper extremity supported Standing balance-Leahy Scale: Fair Standing balance comment: fair to good-. able to mobilize without AD no LOB this session         ADL either performed or assessed with clinical judgement   ADL Overall ADL's : Needs assistance/impaired Eating/Feeding: Supervision/ safety Eating/Feeding Details (indicate cue type and reason): patient noted to have coughing after taking sip of water sitting in bed. patient reported it went down the wrong pipe and that normally does not happen.         Vision Baseline Vision/History: 1 Wears glasses Vision Assessment?: Wears glasses for reading            Pertinent Vitals/Pain Pain Assessment Pain Assessment: No/denies pain     Extremity/Trunk Assessment     Lower Extremity Assessment Lower Extremity Assessment: Generalized weakness   Cervical /  Trunk Assessment Cervical / Trunk Assessment: Normal   Communication Communication Communication: No apparent difficulties   Cognition Arousal: Alert Behavior During Therapy: WFL for tasks assessed/performed Overall Cognitive Status:  Within Functional Limits for tasks assessed       General Comments: some stuttering wiht speech. overall appropriate. appeared to have some decreased insight to deficits, occationally difficult to redirect     General Comments  Noted B distal LE edema and blisters on dorsal aspect of B feet, reports of dizziness wtih supine to sit and sit to stand, BP assessed            Home Living Family/patient expects to be discharged to:: Private residence Living Arrangements: Spouse/significant other Available Help at Discharge: Family;Available PRN/intermittently Type of Home: Apartment Home Access: Stairs to enter Entrance Stairs-Number of Steps: 10 Entrance Stairs-Rails: Right;Left Home Layout: One level     Bathroom Shower/Tub: Chief Strategy Officer: Standard Bathroom Accessibility: No   Home Equipment: None          Prior Functioning/Environment Prior Level of Function : Independent/Modified Independent;Working/employed;Driving             Mobility Comments: ambulates without AD ADLs Comments: reported being independent in ADLs at home. reported girlfriend is unable to help him        OT Problem List: Increased edema;Decreased activity tolerance;Impaired balance (sitting and/or standing);Decreased strength;Decreased safety awareness      OT Treatment/Interventions: Self-care/ADL training;Therapeutic exercise;Energy conservation;DME and/or AE instruction;Therapeutic activities;Balance training;Patient/family education    OT Goals(Current goals can be found in the care plan section) Acute Rehab OT Goals Patient Stated Goal: to get better OT Goal Formulation: With patient Time For Goal Achievement: 07/25/23 Potential to Achieve Goals: Fair  OT Frequency: Min 1X/week    Co-evaluation   Reason for Co-Treatment: Complexity of the patient's impairments (multi-system involvement);Necessary to address cognition/behavior during functional activity PT goals  addressed during session: Mobility/safety with mobility;Balance;Proper use of DME OT goals addressed during session: ADL's and self-care;Proper use of Adaptive equipment and DME      AM-PAC OT "6 Clicks" Daily Activity     Outcome Measure Help from another person eating meals?: None Help from another person taking care of personal grooming?: A Little Help from another person toileting, which includes using toliet, bedpan, or urinal?: A Little Help from another person bathing (including washing, rinsing, drying)?: A Little Help from another person to put on and taking off regular upper body clothing?: A Little Help from another person to put on and taking off regular lower body clothing?: A Little 6 Click Score: 19   End of Session Equipment Utilized During Treatment: Gait belt;Rolling walker (2 wheels) Nurse Communication: Mobility status  Activity Tolerance: Patient tolerated treatment well Patient left: in chair;with call bell/phone within reach;with chair alarm set  OT Visit Diagnosis: Other abnormalities of gait and mobility (R26.89)                Time: 4098-1191 OT Time Calculation (min): 22 min Charges:  OT General Charges $OT Visit: 1 Visit OT Evaluation $OT Eval Low Complexity: 1 Low  Lollie Gunner OTR/L, MS Acute Rehabilitation Department Office# [phone]   Selinda Flavin 07/11/2023, 2:42 PM

## 2023-07-11 NOTE — Progress Notes (Signed)
Progress Note   Patient: William Jones RJJ:[id number] DOB: 08/08/1951 DOA: 07/07/2023     0 DOS: the patient was seen and examined on 07/11/2023        Brief hospital course: William Jones is a 72 year old M with HTN, HLD, and prostate cancer, recently admitted for generalized weakness, found to have widely metastatic liver and bone disease.  Was discharged home, but the weakness returned on resuming Xtandi, so he returned to the hospital     Assessment and Plan: * Generalized weakness TSH, ammonia, BNP normal.  Possible UTI. - Check CK and cortisol - If these are normal, I suspect his symptoms are primarily from William Jones, in the setting of metastatic prostate cancer/failure to thrive    Malnutrition of moderate degree As evidenced by moderate loss of subcutaneous muscle mass and fat - Continue Glucerna - Consult dietitian  Hypokalemia - Supplement potassium  UTI (urinary tract infection) - Continue Rocephin  Malignant small cell cancer [facility]) Consult oncology, appreciate recommendations  Anemia of chronic disease Hemoglobin stable relative to recent baseline  Type 2 diabetes mellitus (HCC) - Continue sliding scale corrections - Hold Jardiance  Prostate cancer metastatic to bone [facility]) - Consult oncology, appreciate cares  Glaucoma - Continue eyedrops  Essential hypertension Blood pressure 140s - Continue losartan - Hold HCTZ          Subjective: Patient is weak and tired, he has trouble sitting up out of a chair, he has no shortness of breath, he has no joint pain, no muscle pain.     Physical Exam: BP 130/73   Pulse (!) 109   Temp 97.6 F (36.4 C) (Oral)   Resp 18   Ht 5\' 9"  (1.753 m)   Wt 68 kg   SpO2 100%   BMI 22.15 kg/m   Thin adult male, lying in bed, interactive and appropriate RRR, no murmurs, no peripheral edema Respiratory normal, lungs clear without rales or wheezes Attention normal, affect blunted, judgment and insight  appear slightly impaired but at baseline, face symmetric, speech fluent, severe severe generalized weakness    Data Reviewed: Discussed with oncology Basic metabolic panel unremarkable CBC shows mild anemia   Family Communication: None    Disposition: Status is: Observation         Author: Alberteen Sam, MD 07/11/2023 6:09 PM  For on call review [email or web address].

## 2023-07-11 NOTE — [place]2 (Signed)
[place] MEDICAID [place]2 LEVEL OF CARE FORM     IDENTIFICATION  Patient Name: William Jones Birthdate: [birth year] Sex: male Admission Date (Current Location): 07/28/2023  [facility] and [place] Number:  Producer, television/film/video and Address:  [facility],  [address] [place], [place] [address]      Provider Number: [id number]  Attending Physician Name and Address:  Alberteen Sam, *  Relative Name and Phone Number:  [name] (Daughter)  [phone] (Mobile)    Current Level of Care: Hospital Recommended Level of Care: Skilled Nursing Facility Prior Approval Number:    Date Approved/Denied:   PASRR Number: [id number]  Discharge Plan: SNF    Current Diagnoses: Patient Active Problem List   Diagnosis Date Noted   Malnutrition of moderate degree 07/11/2023   UTI (urinary tract infection) 07/26/2023   Hypokalemia 07/24/2023   Malignant small cell cancer (HCC) 07/07/2023   Liver lesion 07/02/2023   Generalized weakness 06/30/2023   Abnormal chest x-ray 06/24/2023   Bilateral lower extremity edema 06/24/2023   Confusion 06/24/2023   Acute gastric ulcer with hemorrhage 06/11/2023   Anemia of chronic disease 06/10/2023   Rash 07/07/2021   First degree hemorrhoids 10/14/2020   Thrombosed hemorrhoids 10/14/2020   Type 2 diabetes mellitus (HCC) 12/18/2019   Hyperlipidemia 11/20/2019   Prostate cancer metastatic to bone (HCC) 12/29/2018   Glaucoma 08/21/2018   Leukopenia 04/20/2017   Healthcare maintenance 06/23/2016   Essential hypertension 07/16/2015   Erectile dysfunction 01/23/2014   Iron deficiency anemia 01/08/2014    Orientation RESPIRATION BLADDER Height & Weight     Self, Time, Situation, Place  Normal Continent Weight: 68 kg Height:  5\' 9"  (175.3 cm)  BEHAVIORAL SYMPTOMS/MOOD NEUROLOGICAL BOWEL NUTRITION STATUS      Continent Diet  AMBULATORY STATUS COMMUNICATION OF NEEDS Skin   Supervision Verbally Other (Comment) (Abdomen  puncture, upper, medial)                       Personal Care Assistance Level of Assistance  Bathing, Feeding, Dressing, Total care Bathing Assistance: Limited assistance Feeding assistance: Independent Dressing Assistance: Limited assistance Total Care Assistance: Limited assistance   Functional Limitations Info  Sight, Hearing, Speech Sight Info: Adequate (glasses) Hearing Info: Adequate Speech Info: Adequate    SPECIAL CARE FACTORS FREQUENCY  PT (By licensed PT), OT (By licensed OT)     PT Frequency: 5 X per week OT Frequency: 5 X per week            Contractures Contractures Info: Not present    Additional Factors Info  Allergies   Allergies Info: Metformin  Bad headaches,   Pantoprazole           Current Medications (07/11/2023):  This is the current hospital active medication list Current Facility-Administered Medications  Medication Dose Route Frequency Provider Last Rate Last Admin   acetaminophen (TYLENOL) tablet 650 mg  650 mg Oral Q6H PRN Jonah Blue, MD       Or   acetaminophen (TYLENOL) suppository 650 mg  650 mg Rectal Q6H PRN Jonah Blue, MD       albuterol (PROVENTIL) (2.5 MG/3ML) 0.083% nebulizer solution 2.5 mg  2.5 mg Nebulization Q2H PRN Jonah Blue, MD       bisacodyl (DULCOLAX) EC tablet 5 mg  5 mg Oral Daily PRN Jonah Blue, MD       cefTRIAXone (ROCEPHIN) 1 g in sodium chloride 0.9 % 100 mL IVPB  1 g Intravenous Q24H  Jonah Blue, MD       docusate sodium (COLACE) capsule 100 mg  100 mg Oral BID Jonah Blue, MD   100 mg at 07/11/23 0827   dorzolamide (TRUSOPT) 2 % ophthalmic solution 1 drop  1 drop Right Eye BID Jonah Blue, MD   1 drop at 07/11/23 0740   enoxaparin (LOVENOX) injection 40 mg  40 mg Subcutaneous QHS Jonah Blue, MD   40 mg at 2023-07-21 2211   feeding supplement (ENSURE ENLIVE / ENSURE PLUS) liquid 237 mL  237 mL Oral BID BM Danford, Earl Lites, MD       hydrALAZINE (APRESOLINE) injection 5  mg  5 mg Intravenous Q4H PRN Jonah Blue, MD       insulin aspart (novoLOG) injection 0-15 Units  0-15 Units Subcutaneous TID WC Jonah Blue, MD   5 Units at 07/11/23 1221   losartan (COZAAR) tablet 50 mg  50 mg Oral Daily Jonah Blue, MD   50 mg at 07/11/23 0826   ondansetron (ZOFRAN) tablet 4 mg  4 mg Oral Q6H PRN Jonah Blue, MD       Or   ondansetron [facility]) injection 4 mg  4 mg Intravenous Q6H PRN Jonah Blue, MD       oxyCODONE (Oxy IR/ROXICODONE) immediate release tablet 5 mg  5 mg Oral Q4H PRN Jonah Blue, MD       polyethylene glycol (MIRALAX / GLYCOLAX) packet 17 g  17 g Oral Daily PRN Jonah Blue, MD       timolol (TIMOPTIC) 0.5 % ophthalmic solution 1 drop  1 drop Right Eye BID Jonah Blue, MD   1 drop at 07/11/23 0740     Discharge Medications: Please see discharge summary for a list of discharge medications.  Relevant Imaging Results:  Relevant Lab Results:   Additional Information    Harriett Sine, RN

## 2023-07-11 NOTE — Discharge Instructions (Signed)

## 2023-07-11 NOTE — Assessment & Plan Note (Signed)
New diagnosis last admission.  Unclear source.  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   - Consult Oncology

## 2023-07-11 NOTE — Progress Notes (Signed)
with bilobar hepatic metastatic disease or a primary hepatic malignancy such as multifocal hepatocellular carcinoma. Correlation with serum alpha fetoprotein level may be helpful. If indicated, widespread hepatic metastatic disease should be easily amenable to ultrasound-guided tissue sampling  for further evaluation. 2. Bilateral adrenal metastases. 3. Mild coronary artery calcification. 4. Nonobstructing right renal calculus. Aortic Atherosclerosis (ICD10-I70.0). Electronically Signed   By: Helyn Numbers M.D.   On: 06/30/2023 23:17   MR BRAIN W WO CONTRAST  Result Date: 06/30/2023 CLINICAL DATA:  Metastatic disease evaluation. EXAM: MRI HEAD WITHOUT AND WITH CONTRAST TECHNIQUE: Multiplanar, multiecho pulse sequences of the brain and surrounding structures were obtained without and with intravenous contrast. CONTRAST:  8mL GADAVIST GADOBUTROL 1 MMOL/ML IV SOLN COMPARISON:  Head CT 02/12/2018. FINDINGS: Brain: No acute infarct or hemorrhage. No hydrocephalus or extra-axial collection. No foci of abnormal susceptibility. No mass or abnormal enhancement. Vascular: Normal flow voids and vessel enhancement. Skull and upper cervical spine: Normal marrow signal and enhancement. Sinuses/Orbits: Near-complete opacification of the frontal sinuses, maxillary sinuses and right sphenoid sinus. Orbits are unremarkable. Other: None. IMPRESSION: 1. No evidence of intracranial metastatic disease. 2. Near-complete opacification of the frontal sinuses, maxillary sinuses and right sphenoid sinus. Electronically Signed   By: Orvan Falconer M.D.   On: 06/30/2023 21:43   US Abdomen Limited RUQ (LIVER/GB)  Result Date: 06/30/2023 CLINICAL DATA:  Elevated liver enzymes EXAM: ULTRASOUND ABDOMEN LIMITED RIGHT UPPER QUADRANT COMPARISON:  None Available. FINDINGS: Gallbladder: No gallstones or wall thickening visualized. No sonographic Murphy sign noted by sonographer. Common bile duct: Diameter: 0.2 cm Liver: Scattered mostly echogenic nodules are present throughout the liver. These were not present the CT of 10/30/2018. Some have a targetoid appearance as on image 37 of series 1. Appearance suspicious for diffuse hepatic metastatic disease. Portal vein is patent on color Doppler imaging with normal direction of blood flow  towards the liver. Other: None. IMPRESSION: 1. Numerous echogenic nodules throughout the liver, suspicious for diffuse hepatic metastatic disease. Consider further workup such as CT of the chest, abdomen, pelvis with contrast for further characterization and to assess for potential primary. PET-CT could also be an alternative imaging choice or adjunct. The patient has a history of prostate cancer but this would be an unusual metastatic pathway for prostate cancer, and a gastrointestinal primary or liver primary would be more common. The possibility of macronodular cirrhosis is not entirely discarded but seems less likely given the appearance. Electronically Signed   By: Gaylyn Rong M.D.   On: 06/30/2023 13:45   ECHOCARDIOGRAM COMPLETE  Result Date: 06/30/2023    ECHOCARDIOGRAM REPORT   Patient Name:   William Jones Date of Exam: 06/30/2023 Medical Rec #:  [id number]             Height:       68.0 in Accession #:    [id number]            Weight:       175.7 lb Date of Birth:  1950/12/21              BSA:          1.934 m Patient Age:    72 years              BP:           146/71 mmHg Patient Gender: M                     HR:  with bilobar hepatic metastatic disease or a primary hepatic malignancy such as multifocal hepatocellular carcinoma. Correlation with serum alpha fetoprotein level may be helpful. If indicated, widespread hepatic metastatic disease should be easily amenable to ultrasound-guided tissue sampling  for further evaluation. 2. Bilateral adrenal metastases. 3. Mild coronary artery calcification. 4. Nonobstructing right renal calculus. Aortic Atherosclerosis (ICD10-I70.0). Electronically Signed   By: Helyn Numbers M.D.   On: 06/30/2023 23:17   MR BRAIN W WO CONTRAST  Result Date: 06/30/2023 CLINICAL DATA:  Metastatic disease evaluation. EXAM: MRI HEAD WITHOUT AND WITH CONTRAST TECHNIQUE: Multiplanar, multiecho pulse sequences of the brain and surrounding structures were obtained without and with intravenous contrast. CONTRAST:  8mL GADAVIST GADOBUTROL 1 MMOL/ML IV SOLN COMPARISON:  Head CT 02/12/2018. FINDINGS: Brain: No acute infarct or hemorrhage. No hydrocephalus or extra-axial collection. No foci of abnormal susceptibility. No mass or abnormal enhancement. Vascular: Normal flow voids and vessel enhancement. Skull and upper cervical spine: Normal marrow signal and enhancement. Sinuses/Orbits: Near-complete opacification of the frontal sinuses, maxillary sinuses and right sphenoid sinus. Orbits are unremarkable. Other: None. IMPRESSION: 1. No evidence of intracranial metastatic disease. 2. Near-complete opacification of the frontal sinuses, maxillary sinuses and right sphenoid sinus. Electronically Signed   By: Orvan Falconer M.D.   On: 06/30/2023 21:43   US Abdomen Limited RUQ (LIVER/GB)  Result Date: 06/30/2023 CLINICAL DATA:  Elevated liver enzymes EXAM: ULTRASOUND ABDOMEN LIMITED RIGHT UPPER QUADRANT COMPARISON:  None Available. FINDINGS: Gallbladder: No gallstones or wall thickening visualized. No sonographic Murphy sign noted by sonographer. Common bile duct: Diameter: 0.2 cm Liver: Scattered mostly echogenic nodules are present throughout the liver. These were not present the CT of 10/30/2018. Some have a targetoid appearance as on image 37 of series 1. Appearance suspicious for diffuse hepatic metastatic disease. Portal vein is patent on color Doppler imaging with normal direction of blood flow  towards the liver. Other: None. IMPRESSION: 1. Numerous echogenic nodules throughout the liver, suspicious for diffuse hepatic metastatic disease. Consider further workup such as CT of the chest, abdomen, pelvis with contrast for further characterization and to assess for potential primary. PET-CT could also be an alternative imaging choice or adjunct. The patient has a history of prostate cancer but this would be an unusual metastatic pathway for prostate cancer, and a gastrointestinal primary or liver primary would be more common. The possibility of macronodular cirrhosis is not entirely discarded but seems less likely given the appearance. Electronically Signed   By: Gaylyn Rong M.D.   On: 06/30/2023 13:45   ECHOCARDIOGRAM COMPLETE  Result Date: 06/30/2023    ECHOCARDIOGRAM REPORT   Patient Name:   William Jones Date of Exam: 06/30/2023 Medical Rec #:  [id number]             Height:       68.0 in Accession #:    [id number]            Weight:       175.7 lb Date of Birth:  1950/12/21              BSA:          1.934 m Patient Age:    72 years              BP:           146/71 mmHg Patient Gender: M                     HR:  with bilobar hepatic metastatic disease or a primary hepatic malignancy such as multifocal hepatocellular carcinoma. Correlation with serum alpha fetoprotein level may be helpful. If indicated, widespread hepatic metastatic disease should be easily amenable to ultrasound-guided tissue sampling  for further evaluation. 2. Bilateral adrenal metastases. 3. Mild coronary artery calcification. 4. Nonobstructing right renal calculus. Aortic Atherosclerosis (ICD10-I70.0). Electronically Signed   By: Helyn Numbers M.D.   On: 06/30/2023 23:17   MR BRAIN W WO CONTRAST  Result Date: 06/30/2023 CLINICAL DATA:  Metastatic disease evaluation. EXAM: MRI HEAD WITHOUT AND WITH CONTRAST TECHNIQUE: Multiplanar, multiecho pulse sequences of the brain and surrounding structures were obtained without and with intravenous contrast. CONTRAST:  8mL GADAVIST GADOBUTROL 1 MMOL/ML IV SOLN COMPARISON:  Head CT 02/12/2018. FINDINGS: Brain: No acute infarct or hemorrhage. No hydrocephalus or extra-axial collection. No foci of abnormal susceptibility. No mass or abnormal enhancement. Vascular: Normal flow voids and vessel enhancement. Skull and upper cervical spine: Normal marrow signal and enhancement. Sinuses/Orbits: Near-complete opacification of the frontal sinuses, maxillary sinuses and right sphenoid sinus. Orbits are unremarkable. Other: None. IMPRESSION: 1. No evidence of intracranial metastatic disease. 2. Near-complete opacification of the frontal sinuses, maxillary sinuses and right sphenoid sinus. Electronically Signed   By: Orvan Falconer M.D.   On: 06/30/2023 21:43   US Abdomen Limited RUQ (LIVER/GB)  Result Date: 06/30/2023 CLINICAL DATA:  Elevated liver enzymes EXAM: ULTRASOUND ABDOMEN LIMITED RIGHT UPPER QUADRANT COMPARISON:  None Available. FINDINGS: Gallbladder: No gallstones or wall thickening visualized. No sonographic Murphy sign noted by sonographer. Common bile duct: Diameter: 0.2 cm Liver: Scattered mostly echogenic nodules are present throughout the liver. These were not present the CT of 10/30/2018. Some have a targetoid appearance as on image 37 of series 1. Appearance suspicious for diffuse hepatic metastatic disease. Portal vein is patent on color Doppler imaging with normal direction of blood flow  towards the liver. Other: None. IMPRESSION: 1. Numerous echogenic nodules throughout the liver, suspicious for diffuse hepatic metastatic disease. Consider further workup such as CT of the chest, abdomen, pelvis with contrast for further characterization and to assess for potential primary. PET-CT could also be an alternative imaging choice or adjunct. The patient has a history of prostate cancer but this would be an unusual metastatic pathway for prostate cancer, and a gastrointestinal primary or liver primary would be more common. The possibility of macronodular cirrhosis is not entirely discarded but seems less likely given the appearance. Electronically Signed   By: Gaylyn Rong M.D.   On: 06/30/2023 13:45   ECHOCARDIOGRAM COMPLETE  Result Date: 06/30/2023    ECHOCARDIOGRAM REPORT   Patient Name:   William Jones Date of Exam: 06/30/2023 Medical Rec #:  [id number]             Height:       68.0 in Accession #:    [id number]            Weight:       175.7 lb Date of Birth:  1950/12/21              BSA:          1.934 m Patient Age:    72 years              BP:           146/71 mmHg Patient Gender: M                     HR:  hepatic metastatic disease. Portal vein is patent on color Doppler imaging with normal direction of blood flow towards the liver. Other: None. IMPRESSION: 1. Numerous echogenic nodules throughout the liver, suspicious for diffuse hepatic metastatic disease. Consider further workup such as CT of the chest, abdomen, pelvis with contrast for further characterization and to assess for potential primary. PET-CT could also be an alternative imaging choice or adjunct. The patient has a history of prostate cancer but this would be an  unusual metastatic pathway for prostate cancer, and a gastrointestinal primary or liver primary would be more common. The possibility of macronodular cirrhosis is not entirely discarded but seems less likely given the appearance. Electronically Signed   By: Gaylyn Rong M.D.   On: 06/30/2023 13:45   ECHOCARDIOGRAM COMPLETE  Result Date: 06/30/2023    ECHOCARDIOGRAM REPORT   Patient Name:   William Jones Date of Exam: 06/30/2023 Medical Rec #:  [id number]             Height:       68.0 in Accession #:    [id number]            Weight:       175.7 lb Date of Birth:  July 13, 1951              BSA:          1.934 m Patient Age:    72 years              BP:           146/71 mmHg Patient Gender: M                     HR:           71 bpm. Exam Location:  Inpatient Procedure: 2D Echo, Cardiac Doppler and Color Doppler Indications:    R06.9 DOE; R60.0 Lower extremity edema; R53.83 Fatigue  History:        Patient has no prior history of Echocardiogram examinations.                 Signs/Symptoms:Dyspnea, Edema and Fatigue; Risk                 Factors:Hypertension, Diabetes, Former Smoker, History of                 substance abuse. and Dyslipidemia. Patient denies chest pain. He                 does have DOE, leg edema and extreme fatigue since 06/06/2023.  Sonographer:    Carlos American RVT, RDCS (AE), RDMS Referring Phys: [id number] EMILY B MULLEN  Sonographer Comments: Suboptimal apical window. Image acquisition challenging due to respiratory motion. IMPRESSIONS  1. Left ventricular ejection fraction, by estimation, is 60 to 65%. The left ventricle has normal function. The left ventricle has no regional wall motion abnormalities. There is mild concentric left ventricular hypertrophy. Left ventricular diastolic parameters are consistent with Grade I diastolic dysfunction (impaired relaxation).  2. Right ventricular systolic function is normal. The right ventricular size is normal.  3. The mitral valve is normal in  structure. Trivial mitral valve regurgitation. No evidence of mitral stenosis.  4. The aortic valve is normal in structure. Aortic valve regurgitation is not visualized. Aortic valve sclerosis/calcification is present, without any evidence of aortic stenosis.  5. The inferior vena cava is normal in size with greater than 50% respiratory variability, suggesting right atrial pressure of 3  with bilobar hepatic metastatic disease or a primary hepatic malignancy such as multifocal hepatocellular carcinoma. Correlation with serum alpha fetoprotein level may be helpful. If indicated, widespread hepatic metastatic disease should be easily amenable to ultrasound-guided tissue sampling  for further evaluation. 2. Bilateral adrenal metastases. 3. Mild coronary artery calcification. 4. Nonobstructing right renal calculus. Aortic Atherosclerosis (ICD10-I70.0). Electronically Signed   By: Helyn Numbers M.D.   On: 06/30/2023 23:17   MR BRAIN W WO CONTRAST  Result Date: 06/30/2023 CLINICAL DATA:  Metastatic disease evaluation. EXAM: MRI HEAD WITHOUT AND WITH CONTRAST TECHNIQUE: Multiplanar, multiecho pulse sequences of the brain and surrounding structures were obtained without and with intravenous contrast. CONTRAST:  8mL GADAVIST GADOBUTROL 1 MMOL/ML IV SOLN COMPARISON:  Head CT 02/12/2018. FINDINGS: Brain: No acute infarct or hemorrhage. No hydrocephalus or extra-axial collection. No foci of abnormal susceptibility. No mass or abnormal enhancement. Vascular: Normal flow voids and vessel enhancement. Skull and upper cervical spine: Normal marrow signal and enhancement. Sinuses/Orbits: Near-complete opacification of the frontal sinuses, maxillary sinuses and right sphenoid sinus. Orbits are unremarkable. Other: None. IMPRESSION: 1. No evidence of intracranial metastatic disease. 2. Near-complete opacification of the frontal sinuses, maxillary sinuses and right sphenoid sinus. Electronically Signed   By: Orvan Falconer M.D.   On: 06/30/2023 21:43   US Abdomen Limited RUQ (LIVER/GB)  Result Date: 06/30/2023 CLINICAL DATA:  Elevated liver enzymes EXAM: ULTRASOUND ABDOMEN LIMITED RIGHT UPPER QUADRANT COMPARISON:  None Available. FINDINGS: Gallbladder: No gallstones or wall thickening visualized. No sonographic Murphy sign noted by sonographer. Common bile duct: Diameter: 0.2 cm Liver: Scattered mostly echogenic nodules are present throughout the liver. These were not present the CT of 10/30/2018. Some have a targetoid appearance as on image 37 of series 1. Appearance suspicious for diffuse hepatic metastatic disease. Portal vein is patent on color Doppler imaging with normal direction of blood flow  towards the liver. Other: None. IMPRESSION: 1. Numerous echogenic nodules throughout the liver, suspicious for diffuse hepatic metastatic disease. Consider further workup such as CT of the chest, abdomen, pelvis with contrast for further characterization and to assess for potential primary. PET-CT could also be an alternative imaging choice or adjunct. The patient has a history of prostate cancer but this would be an unusual metastatic pathway for prostate cancer, and a gastrointestinal primary or liver primary would be more common. The possibility of macronodular cirrhosis is not entirely discarded but seems less likely given the appearance. Electronically Signed   By: Gaylyn Rong M.D.   On: 06/30/2023 13:45   ECHOCARDIOGRAM COMPLETE  Result Date: 06/30/2023    ECHOCARDIOGRAM REPORT   Patient Name:   William Jones Date of Exam: 06/30/2023 Medical Rec #:  [id number]             Height:       68.0 in Accession #:    [id number]            Weight:       175.7 lb Date of Birth:  1950/12/21              BSA:          1.934 m Patient Age:    72 years              BP:           146/71 mmHg Patient Gender: M                     HR:  hepatic metastatic disease. Portal vein is patent on color Doppler imaging with normal direction of blood flow towards the liver. Other: None. IMPRESSION: 1. Numerous echogenic nodules throughout the liver, suspicious for diffuse hepatic metastatic disease. Consider further workup such as CT of the chest, abdomen, pelvis with contrast for further characterization and to assess for potential primary. PET-CT could also be an alternative imaging choice or adjunct. The patient has a history of prostate cancer but this would be an  unusual metastatic pathway for prostate cancer, and a gastrointestinal primary or liver primary would be more common. The possibility of macronodular cirrhosis is not entirely discarded but seems less likely given the appearance. Electronically Signed   By: Gaylyn Rong M.D.   On: 06/30/2023 13:45   ECHOCARDIOGRAM COMPLETE  Result Date: 06/30/2023    ECHOCARDIOGRAM REPORT   Patient Name:   William Jones Date of Exam: 06/30/2023 Medical Rec #:  [id number]             Height:       68.0 in Accession #:    [id number]            Weight:       175.7 lb Date of Birth:  July 13, 1951              BSA:          1.934 m Patient Age:    72 years              BP:           146/71 mmHg Patient Gender: M                     HR:           71 bpm. Exam Location:  Inpatient Procedure: 2D Echo, Cardiac Doppler and Color Doppler Indications:    R06.9 DOE; R60.0 Lower extremity edema; R53.83 Fatigue  History:        Patient has no prior history of Echocardiogram examinations.                 Signs/Symptoms:Dyspnea, Edema and Fatigue; Risk                 Factors:Hypertension, Diabetes, Former Smoker, History of                 substance abuse. and Dyslipidemia. Patient denies chest pain. He                 does have DOE, leg edema and extreme fatigue since 06/06/2023.  Sonographer:    Carlos American RVT, RDCS (AE), RDMS Referring Phys: [id number] EMILY B MULLEN  Sonographer Comments: Suboptimal apical window. Image acquisition challenging due to respiratory motion. IMPRESSIONS  1. Left ventricular ejection fraction, by estimation, is 60 to 65%. The left ventricle has normal function. The left ventricle has no regional wall motion abnormalities. There is mild concentric left ventricular hypertrophy. Left ventricular diastolic parameters are consistent with Grade I diastolic dysfunction (impaired relaxation).  2. Right ventricular systolic function is normal. The right ventricular size is normal.  3. The mitral valve is normal in  structure. Trivial mitral valve regurgitation. No evidence of mitral stenosis.  4. The aortic valve is normal in structure. Aortic valve regurgitation is not visualized. Aortic valve sclerosis/calcification is present, without any evidence of aortic stenosis.  5. The inferior vena cava is normal in size with greater than 50% respiratory variability, suggesting right atrial pressure of 3  with bilobar hepatic metastatic disease or a primary hepatic malignancy such as multifocal hepatocellular carcinoma. Correlation with serum alpha fetoprotein level may be helpful. If indicated, widespread hepatic metastatic disease should be easily amenable to ultrasound-guided tissue sampling  for further evaluation. 2. Bilateral adrenal metastases. 3. Mild coronary artery calcification. 4. Nonobstructing right renal calculus. Aortic Atherosclerosis (ICD10-I70.0). Electronically Signed   By: Helyn Numbers M.D.   On: 06/30/2023 23:17   MR BRAIN W WO CONTRAST  Result Date: 06/30/2023 CLINICAL DATA:  Metastatic disease evaluation. EXAM: MRI HEAD WITHOUT AND WITH CONTRAST TECHNIQUE: Multiplanar, multiecho pulse sequences of the brain and surrounding structures were obtained without and with intravenous contrast. CONTRAST:  8mL GADAVIST GADOBUTROL 1 MMOL/ML IV SOLN COMPARISON:  Head CT 02/12/2018. FINDINGS: Brain: No acute infarct or hemorrhage. No hydrocephalus or extra-axial collection. No foci of abnormal susceptibility. No mass or abnormal enhancement. Vascular: Normal flow voids and vessel enhancement. Skull and upper cervical spine: Normal marrow signal and enhancement. Sinuses/Orbits: Near-complete opacification of the frontal sinuses, maxillary sinuses and right sphenoid sinus. Orbits are unremarkable. Other: None. IMPRESSION: 1. No evidence of intracranial metastatic disease. 2. Near-complete opacification of the frontal sinuses, maxillary sinuses and right sphenoid sinus. Electronically Signed   By: Orvan Falconer M.D.   On: 06/30/2023 21:43   US Abdomen Limited RUQ (LIVER/GB)  Result Date: 06/30/2023 CLINICAL DATA:  Elevated liver enzymes EXAM: ULTRASOUND ABDOMEN LIMITED RIGHT UPPER QUADRANT COMPARISON:  None Available. FINDINGS: Gallbladder: No gallstones or wall thickening visualized. No sonographic Murphy sign noted by sonographer. Common bile duct: Diameter: 0.2 cm Liver: Scattered mostly echogenic nodules are present throughout the liver. These were not present the CT of 10/30/2018. Some have a targetoid appearance as on image 37 of series 1. Appearance suspicious for diffuse hepatic metastatic disease. Portal vein is patent on color Doppler imaging with normal direction of blood flow  towards the liver. Other: None. IMPRESSION: 1. Numerous echogenic nodules throughout the liver, suspicious for diffuse hepatic metastatic disease. Consider further workup such as CT of the chest, abdomen, pelvis with contrast for further characterization and to assess for potential primary. PET-CT could also be an alternative imaging choice or adjunct. The patient has a history of prostate cancer but this would be an unusual metastatic pathway for prostate cancer, and a gastrointestinal primary or liver primary would be more common. The possibility of macronodular cirrhosis is not entirely discarded but seems less likely given the appearance. Electronically Signed   By: Gaylyn Rong M.D.   On: 06/30/2023 13:45   ECHOCARDIOGRAM COMPLETE  Result Date: 06/30/2023    ECHOCARDIOGRAM REPORT   Patient Name:   William Jones Date of Exam: 06/30/2023 Medical Rec #:  [id number]             Height:       68.0 in Accession #:    [id number]            Weight:       175.7 lb Date of Birth:  1950/12/21              BSA:          1.934 m Patient Age:    72 years              BP:           146/71 mmHg Patient Gender: M                     HR:  hepatic metastatic disease. Portal vein is patent on color Doppler imaging with normal direction of blood flow towards the liver. Other: None. IMPRESSION: 1. Numerous echogenic nodules throughout the liver, suspicious for diffuse hepatic metastatic disease. Consider further workup such as CT of the chest, abdomen, pelvis with contrast for further characterization and to assess for potential primary. PET-CT could also be an alternative imaging choice or adjunct. The patient has a history of prostate cancer but this would be an  unusual metastatic pathway for prostate cancer, and a gastrointestinal primary or liver primary would be more common. The possibility of macronodular cirrhosis is not entirely discarded but seems less likely given the appearance. Electronically Signed   By: Gaylyn Rong M.D.   On: 06/30/2023 13:45   ECHOCARDIOGRAM COMPLETE  Result Date: 06/30/2023    ECHOCARDIOGRAM REPORT   Patient Name:   William Jones Date of Exam: 06/30/2023 Medical Rec #:  [id number]             Height:       68.0 in Accession #:    [id number]            Weight:       175.7 lb Date of Birth:  July 13, 1951              BSA:          1.934 m Patient Age:    72 years              BP:           146/71 mmHg Patient Gender: M                     HR:           71 bpm. Exam Location:  Inpatient Procedure: 2D Echo, Cardiac Doppler and Color Doppler Indications:    R06.9 DOE; R60.0 Lower extremity edema; R53.83 Fatigue  History:        Patient has no prior history of Echocardiogram examinations.                 Signs/Symptoms:Dyspnea, Edema and Fatigue; Risk                 Factors:Hypertension, Diabetes, Former Smoker, History of                 substance abuse. and Dyslipidemia. Patient denies chest pain. He                 does have DOE, leg edema and extreme fatigue since 06/06/2023.  Sonographer:    Carlos American RVT, RDCS (AE), RDMS Referring Phys: [id number] EMILY B MULLEN  Sonographer Comments: Suboptimal apical window. Image acquisition challenging due to respiratory motion. IMPRESSIONS  1. Left ventricular ejection fraction, by estimation, is 60 to 65%. The left ventricle has normal function. The left ventricle has no regional wall motion abnormalities. There is mild concentric left ventricular hypertrophy. Left ventricular diastolic parameters are consistent with Grade I diastolic dysfunction (impaired relaxation).  2. Right ventricular systolic function is normal. The right ventricular size is normal.  3. The mitral valve is normal in  structure. Trivial mitral valve regurgitation. No evidence of mitral stenosis.  4. The aortic valve is normal in structure. Aortic valve regurgitation is not visualized. Aortic valve sclerosis/calcification is present, without any evidence of aortic stenosis.  5. The inferior vena cava is normal in size with greater than 50% respiratory variability, suggesting right atrial pressure of 3  with bilobar hepatic metastatic disease or a primary hepatic malignancy such as multifocal hepatocellular carcinoma. Correlation with serum alpha fetoprotein level may be helpful. If indicated, widespread hepatic metastatic disease should be easily amenable to ultrasound-guided tissue sampling  for further evaluation. 2. Bilateral adrenal metastases. 3. Mild coronary artery calcification. 4. Nonobstructing right renal calculus. Aortic Atherosclerosis (ICD10-I70.0). Electronically Signed   By: Helyn Numbers M.D.   On: 06/30/2023 23:17   MR BRAIN W WO CONTRAST  Result Date: 06/30/2023 CLINICAL DATA:  Metastatic disease evaluation. EXAM: MRI HEAD WITHOUT AND WITH CONTRAST TECHNIQUE: Multiplanar, multiecho pulse sequences of the brain and surrounding structures were obtained without and with intravenous contrast. CONTRAST:  8mL GADAVIST GADOBUTROL 1 MMOL/ML IV SOLN COMPARISON:  Head CT 02/12/2018. FINDINGS: Brain: No acute infarct or hemorrhage. No hydrocephalus or extra-axial collection. No foci of abnormal susceptibility. No mass or abnormal enhancement. Vascular: Normal flow voids and vessel enhancement. Skull and upper cervical spine: Normal marrow signal and enhancement. Sinuses/Orbits: Near-complete opacification of the frontal sinuses, maxillary sinuses and right sphenoid sinus. Orbits are unremarkable. Other: None. IMPRESSION: 1. No evidence of intracranial metastatic disease. 2. Near-complete opacification of the frontal sinuses, maxillary sinuses and right sphenoid sinus. Electronically Signed   By: Orvan Falconer M.D.   On: 06/30/2023 21:43   US Abdomen Limited RUQ (LIVER/GB)  Result Date: 06/30/2023 CLINICAL DATA:  Elevated liver enzymes EXAM: ULTRASOUND ABDOMEN LIMITED RIGHT UPPER QUADRANT COMPARISON:  None Available. FINDINGS: Gallbladder: No gallstones or wall thickening visualized. No sonographic Murphy sign noted by sonographer. Common bile duct: Diameter: 0.2 cm Liver: Scattered mostly echogenic nodules are present throughout the liver. These were not present the CT of 10/30/2018. Some have a targetoid appearance as on image 37 of series 1. Appearance suspicious for diffuse hepatic metastatic disease. Portal vein is patent on color Doppler imaging with normal direction of blood flow  towards the liver. Other: None. IMPRESSION: 1. Numerous echogenic nodules throughout the liver, suspicious for diffuse hepatic metastatic disease. Consider further workup such as CT of the chest, abdomen, pelvis with contrast for further characterization and to assess for potential primary. PET-CT could also be an alternative imaging choice or adjunct. The patient has a history of prostate cancer but this would be an unusual metastatic pathway for prostate cancer, and a gastrointestinal primary or liver primary would be more common. The possibility of macronodular cirrhosis is not entirely discarded but seems less likely given the appearance. Electronically Signed   By: Gaylyn Rong M.D.   On: 06/30/2023 13:45   ECHOCARDIOGRAM COMPLETE  Result Date: 06/30/2023    ECHOCARDIOGRAM REPORT   Patient Name:   William Jones Date of Exam: 06/30/2023 Medical Rec #:  [id number]             Height:       68.0 in Accession #:    [id number]            Weight:       175.7 lb Date of Birth:  1950/12/21              BSA:          1.934 m Patient Age:    72 years              BP:           146/71 mmHg Patient Gender: M                     HR:  hepatic metastatic disease. Portal vein is patent on color Doppler imaging with normal direction of blood flow towards the liver. Other: None. IMPRESSION: 1. Numerous echogenic nodules throughout the liver, suspicious for diffuse hepatic metastatic disease. Consider further workup such as CT of the chest, abdomen, pelvis with contrast for further characterization and to assess for potential primary. PET-CT could also be an alternative imaging choice or adjunct. The patient has a history of prostate cancer but this would be an  unusual metastatic pathway for prostate cancer, and a gastrointestinal primary or liver primary would be more common. The possibility of macronodular cirrhosis is not entirely discarded but seems less likely given the appearance. Electronically Signed   By: Gaylyn Rong M.D.   On: 06/30/2023 13:45   ECHOCARDIOGRAM COMPLETE  Result Date: 06/30/2023    ECHOCARDIOGRAM REPORT   Patient Name:   William Jones Date of Exam: 06/30/2023 Medical Rec #:  [id number]             Height:       68.0 in Accession #:    [id number]            Weight:       175.7 lb Date of Birth:  July 13, 1951              BSA:          1.934 m Patient Age:    72 years              BP:           146/71 mmHg Patient Gender: M                     HR:           71 bpm. Exam Location:  Inpatient Procedure: 2D Echo, Cardiac Doppler and Color Doppler Indications:    R06.9 DOE; R60.0 Lower extremity edema; R53.83 Fatigue  History:        Patient has no prior history of Echocardiogram examinations.                 Signs/Symptoms:Dyspnea, Edema and Fatigue; Risk                 Factors:Hypertension, Diabetes, Former Smoker, History of                 substance abuse. and Dyslipidemia. Patient denies chest pain. He                 does have DOE, leg edema and extreme fatigue since 06/06/2023.  Sonographer:    Carlos American RVT, RDCS (AE), RDMS Referring Phys: [id number] EMILY B MULLEN  Sonographer Comments: Suboptimal apical window. Image acquisition challenging due to respiratory motion. IMPRESSIONS  1. Left ventricular ejection fraction, by estimation, is 60 to 65%. The left ventricle has normal function. The left ventricle has no regional wall motion abnormalities. There is mild concentric left ventricular hypertrophy. Left ventricular diastolic parameters are consistent with Grade I diastolic dysfunction (impaired relaxation).  2. Right ventricular systolic function is normal. The right ventricular size is normal.  3. The mitral valve is normal in  structure. Trivial mitral valve regurgitation. No evidence of mitral stenosis.  4. The aortic valve is normal in structure. Aortic valve regurgitation is not visualized. Aortic valve sclerosis/calcification is present, without any evidence of aortic stenosis.  5. The inferior vena cava is normal in size with greater than 50% respiratory variability, suggesting right atrial pressure of 3  with bilobar hepatic metastatic disease or a primary hepatic malignancy such as multifocal hepatocellular carcinoma. Correlation with serum alpha fetoprotein level may be helpful. If indicated, widespread hepatic metastatic disease should be easily amenable to ultrasound-guided tissue sampling  for further evaluation. 2. Bilateral adrenal metastases. 3. Mild coronary artery calcification. 4. Nonobstructing right renal calculus. Aortic Atherosclerosis (ICD10-I70.0). Electronically Signed   By: Helyn Numbers M.D.   On: 06/30/2023 23:17   MR BRAIN W WO CONTRAST  Result Date: 06/30/2023 CLINICAL DATA:  Metastatic disease evaluation. EXAM: MRI HEAD WITHOUT AND WITH CONTRAST TECHNIQUE: Multiplanar, multiecho pulse sequences of the brain and surrounding structures were obtained without and with intravenous contrast. CONTRAST:  8mL GADAVIST GADOBUTROL 1 MMOL/ML IV SOLN COMPARISON:  Head CT 02/12/2018. FINDINGS: Brain: No acute infarct or hemorrhage. No hydrocephalus or extra-axial collection. No foci of abnormal susceptibility. No mass or abnormal enhancement. Vascular: Normal flow voids and vessel enhancement. Skull and upper cervical spine: Normal marrow signal and enhancement. Sinuses/Orbits: Near-complete opacification of the frontal sinuses, maxillary sinuses and right sphenoid sinus. Orbits are unremarkable. Other: None. IMPRESSION: 1. No evidence of intracranial metastatic disease. 2. Near-complete opacification of the frontal sinuses, maxillary sinuses and right sphenoid sinus. Electronically Signed   By: Orvan Falconer M.D.   On: 06/30/2023 21:43   US Abdomen Limited RUQ (LIVER/GB)  Result Date: 06/30/2023 CLINICAL DATA:  Elevated liver enzymes EXAM: ULTRASOUND ABDOMEN LIMITED RIGHT UPPER QUADRANT COMPARISON:  None Available. FINDINGS: Gallbladder: No gallstones or wall thickening visualized. No sonographic Murphy sign noted by sonographer. Common bile duct: Diameter: 0.2 cm Liver: Scattered mostly echogenic nodules are present throughout the liver. These were not present the CT of 10/30/2018. Some have a targetoid appearance as on image 37 of series 1. Appearance suspicious for diffuse hepatic metastatic disease. Portal vein is patent on color Doppler imaging with normal direction of blood flow  towards the liver. Other: None. IMPRESSION: 1. Numerous echogenic nodules throughout the liver, suspicious for diffuse hepatic metastatic disease. Consider further workup such as CT of the chest, abdomen, pelvis with contrast for further characterization and to assess for potential primary. PET-CT could also be an alternative imaging choice or adjunct. The patient has a history of prostate cancer but this would be an unusual metastatic pathway for prostate cancer, and a gastrointestinal primary or liver primary would be more common. The possibility of macronodular cirrhosis is not entirely discarded but seems less likely given the appearance. Electronically Signed   By: Gaylyn Rong M.D.   On: 06/30/2023 13:45   ECHOCARDIOGRAM COMPLETE  Result Date: 06/30/2023    ECHOCARDIOGRAM REPORT   Patient Name:   William Jones Date of Exam: 06/30/2023 Medical Rec #:  [id number]             Height:       68.0 in Accession #:    [id number]            Weight:       175.7 lb Date of Birth:  1950/12/21              BSA:          1.934 m Patient Age:    72 years              BP:           146/71 mmHg Patient Gender: M                     HR:  with bilobar hepatic metastatic disease or a primary hepatic malignancy such as multifocal hepatocellular carcinoma. Correlation with serum alpha fetoprotein level may be helpful. If indicated, widespread hepatic metastatic disease should be easily amenable to ultrasound-guided tissue sampling  for further evaluation. 2. Bilateral adrenal metastases. 3. Mild coronary artery calcification. 4. Nonobstructing right renal calculus. Aortic Atherosclerosis (ICD10-I70.0). Electronically Signed   By: Helyn Numbers M.D.   On: 06/30/2023 23:17   MR BRAIN W WO CONTRAST  Result Date: 06/30/2023 CLINICAL DATA:  Metastatic disease evaluation. EXAM: MRI HEAD WITHOUT AND WITH CONTRAST TECHNIQUE: Multiplanar, multiecho pulse sequences of the brain and surrounding structures were obtained without and with intravenous contrast. CONTRAST:  8mL GADAVIST GADOBUTROL 1 MMOL/ML IV SOLN COMPARISON:  Head CT 02/12/2018. FINDINGS: Brain: No acute infarct or hemorrhage. No hydrocephalus or extra-axial collection. No foci of abnormal susceptibility. No mass or abnormal enhancement. Vascular: Normal flow voids and vessel enhancement. Skull and upper cervical spine: Normal marrow signal and enhancement. Sinuses/Orbits: Near-complete opacification of the frontal sinuses, maxillary sinuses and right sphenoid sinus. Orbits are unremarkable. Other: None. IMPRESSION: 1. No evidence of intracranial metastatic disease. 2. Near-complete opacification of the frontal sinuses, maxillary sinuses and right sphenoid sinus. Electronically Signed   By: Orvan Falconer M.D.   On: 06/30/2023 21:43   US Abdomen Limited RUQ (LIVER/GB)  Result Date: 06/30/2023 CLINICAL DATA:  Elevated liver enzymes EXAM: ULTRASOUND ABDOMEN LIMITED RIGHT UPPER QUADRANT COMPARISON:  None Available. FINDINGS: Gallbladder: No gallstones or wall thickening visualized. No sonographic Murphy sign noted by sonographer. Common bile duct: Diameter: 0.2 cm Liver: Scattered mostly echogenic nodules are present throughout the liver. These were not present the CT of 10/30/2018. Some have a targetoid appearance as on image 37 of series 1. Appearance suspicious for diffuse hepatic metastatic disease. Portal vein is patent on color Doppler imaging with normal direction of blood flow  towards the liver. Other: None. IMPRESSION: 1. Numerous echogenic nodules throughout the liver, suspicious for diffuse hepatic metastatic disease. Consider further workup such as CT of the chest, abdomen, pelvis with contrast for further characterization and to assess for potential primary. PET-CT could also be an alternative imaging choice or adjunct. The patient has a history of prostate cancer but this would be an unusual metastatic pathway for prostate cancer, and a gastrointestinal primary or liver primary would be more common. The possibility of macronodular cirrhosis is not entirely discarded but seems less likely given the appearance. Electronically Signed   By: Gaylyn Rong M.D.   On: 06/30/2023 13:45   ECHOCARDIOGRAM COMPLETE  Result Date: 06/30/2023    ECHOCARDIOGRAM REPORT   Patient Name:   William Jones Date of Exam: 06/30/2023 Medical Rec #:  [id number]             Height:       68.0 in Accession #:    [id number]            Weight:       175.7 lb Date of Birth:  1950/12/21              BSA:          1.934 m Patient Age:    72 years              BP:           146/71 mmHg Patient Gender: M                     HR:  hepatic metastatic disease. Portal vein is patent on color Doppler imaging with normal direction of blood flow towards the liver. Other: None. IMPRESSION: 1. Numerous echogenic nodules throughout the liver, suspicious for diffuse hepatic metastatic disease. Consider further workup such as CT of the chest, abdomen, pelvis with contrast for further characterization and to assess for potential primary. PET-CT could also be an alternative imaging choice or adjunct. The patient has a history of prostate cancer but this would be an  unusual metastatic pathway for prostate cancer, and a gastrointestinal primary or liver primary would be more common. The possibility of macronodular cirrhosis is not entirely discarded but seems less likely given the appearance. Electronically Signed   By: Gaylyn Rong M.D.   On: 06/30/2023 13:45   ECHOCARDIOGRAM COMPLETE  Result Date: 06/30/2023    ECHOCARDIOGRAM REPORT   Patient Name:   William Jones Date of Exam: 06/30/2023 Medical Rec #:  [id number]             Height:       68.0 in Accession #:    [id number]            Weight:       175.7 lb Date of Birth:  July 13, 1951              BSA:          1.934 m Patient Age:    72 years              BP:           146/71 mmHg Patient Gender: M                     HR:           71 bpm. Exam Location:  Inpatient Procedure: 2D Echo, Cardiac Doppler and Color Doppler Indications:    R06.9 DOE; R60.0 Lower extremity edema; R53.83 Fatigue  History:        Patient has no prior history of Echocardiogram examinations.                 Signs/Symptoms:Dyspnea, Edema and Fatigue; Risk                 Factors:Hypertension, Diabetes, Former Smoker, History of                 substance abuse. and Dyslipidemia. Patient denies chest pain. He                 does have DOE, leg edema and extreme fatigue since 06/06/2023.  Sonographer:    Carlos American RVT, RDCS (AE), RDMS Referring Phys: [id number] EMILY B MULLEN  Sonographer Comments: Suboptimal apical window. Image acquisition challenging due to respiratory motion. IMPRESSIONS  1. Left ventricular ejection fraction, by estimation, is 60 to 65%. The left ventricle has normal function. The left ventricle has no regional wall motion abnormalities. There is mild concentric left ventricular hypertrophy. Left ventricular diastolic parameters are consistent with Grade I diastolic dysfunction (impaired relaxation).  2. Right ventricular systolic function is normal. The right ventricular size is normal.  3. The mitral valve is normal in  structure. Trivial mitral valve regurgitation. No evidence of mitral stenosis.  4. The aortic valve is normal in structure. Aortic valve regurgitation is not visualized. Aortic valve sclerosis/calcification is present, without any evidence of aortic stenosis.  5. The inferior vena cava is normal in size with greater than 50% respiratory variability, suggesting right atrial pressure of 3

## 2023-07-11 NOTE — Assessment & Plan Note (Signed)
Hyperglycemic. - Add semglee insulin - Continue sliding scale corrections, increase dose - Hold Jardiance given UTI

## 2023-07-11 NOTE — Progress Notes (Signed)
Initial Nutrition Assessment  DOCUMENTATION CODES:   Non-severe (moderate) malnutrition in context of chronic illness  INTERVENTION:   -Ensure Plus High Protein po BID, each supplement provides 350 kcal and 20 grams of protein.   -Placed "High Calorie, High Protein" handout in AVS  NUTRITION DIAGNOSIS:   Moderate Malnutrition related to chronic illness, cancer and cancer related treatments as evidenced by mild fat depletion, moderate muscle depletion, percent weight loss.  GOAL:   Patient will meet greater than or equal to 90% of their needs  MONITOR:   PO intake, Supplement acceptance, Labs, Weight trends, I & O's  REASON FOR ASSESSMENT:   Consult Assessment of nutrition requirement/status  ASSESSMENT:   71 y.o. male with medical history significant of HTN, glaucoma, HLD, and prostate CA presenting with generalized weakness.  He was recently admitted from 9/25-10/1 with generalized weakness.  During that admission, Xtandi for metastatic prostate cancer was held since this can exacerbate weakness.  He was noted to have diffuse hepatic metastatic disease as well as B adrenal mets.  Liver biopsy showed SCC, unsure if primary vs. Metastatic.  He was seen by Dr. Al Pimple on 10/3 and is planning to start [place] etoposide with first dose on 10/8.  Patient in room ,sitting in chair. Pt reports increased weakness lately. Pt states he is unable to eat chicken or beef. He has some difficulty with chewing and has had incidents where food gets stuck in his throat. Pt ate egg salad this morning and he did well with this. Likes fish and well cooked vegetables. Pt with no issues swallowing liquids. Agreeable to drinking Ensure as he is to start radiation tomorrow.  Per weight records, pt has lost 42 lbs since 1/17 (21% wt loss x 8.5 months, significant for time frame).  Medications: Colace, KLOR-CON  Labs reviewed: CBGs: 210-277   NUTRITION - FOCUSED PHYSICAL EXAM:  Flowsheet Row Most Recent  Value  Orbital Region Mild depletion  Upper Arm Region Moderate depletion  Thoracic and Lumbar Region Unable to assess  Buccal Region Mild depletion  Temple Region Mild depletion  Clavicle Bone Region No depletion  Clavicle and Acromion Bone Region No depletion  Scapular Bone Region No depletion  Dorsal Hand Mild depletion  Patellar Region Moderate depletion  Anterior Thigh Region Moderate depletion  Posterior Calf Region Moderate depletion  Edema (RD Assessment) None  Hair Reviewed  Eyes Reviewed  Mouth Reviewed  [missing some teeth]  Skin Reviewed  Nails Reviewed       Diet Order:   Diet Order             DIET DYS 3 Room service appropriate? Yes; Fluid consistency: Thin  Diet effective now                   EDUCATION NEEDS:   Education needs have been addressed  Skin:  Skin Assessment: Reviewed RN Assessment  Last BM:  10/6  Height:   Ht Readings from Last 1 Encounters:  07/10/23 5\' 9"  (1.753 m)    Weight:   Wt Readings from Last 1 Encounters:  07/10/23 68 kg    BMI:  Body mass index is 22.15 kg/m.  Estimated Nutritional Needs:   Kcal:  2050-2250  Protein:  100-115g  Fluid:  2.2L/day  Tilda Franco, MS, RD, LDN Inpatient Clinical Dietitian Contact information available via Amion

## 2023-07-11 NOTE — Assessment & Plan Note (Signed)
-   Consult oncology, appreciate cares ?

## 2023-07-11 NOTE — Assessment & Plan Note (Signed)
Now in shock - Hold losartan and HCTZ

## 2023-07-12 ENCOUNTER — Inpatient Hospital Stay: Payer: 59

## 2023-07-12 ENCOUNTER — Inpatient Hospital Stay (HOSPITAL_COMMUNITY): Payer: 59

## 2023-07-12 DIAGNOSIS — E440 Moderate protein-calorie malnutrition: Secondary | ICD-10-CM | POA: Diagnosis present

## 2023-07-12 DIAGNOSIS — E119 Type 2 diabetes mellitus without complications: Secondary | ICD-10-CM

## 2023-07-12 DIAGNOSIS — Z711 Person with feared health complaint in whom no diagnosis is made: Secondary | ICD-10-CM | POA: Diagnosis not present

## 2023-07-12 DIAGNOSIS — D638 Anemia in other chronic diseases classified elsewhere: Secondary | ICD-10-CM

## 2023-07-12 DIAGNOSIS — K921 Melena: Secondary | ICD-10-CM | POA: Diagnosis not present

## 2023-07-12 DIAGNOSIS — E1165 Type 2 diabetes mellitus with hyperglycemia: Secondary | ICD-10-CM | POA: Diagnosis present

## 2023-07-12 DIAGNOSIS — I110 Hypertensive heart disease with heart failure: Secondary | ICD-10-CM | POA: Diagnosis present

## 2023-07-12 DIAGNOSIS — R64 Cachexia: Secondary | ICD-10-CM | POA: Diagnosis present

## 2023-07-12 DIAGNOSIS — C61 Malignant neoplasm of prostate: Secondary | ICD-10-CM | POA: Diagnosis present

## 2023-07-12 DIAGNOSIS — Z515 Encounter for palliative care: Secondary | ICD-10-CM | POA: Diagnosis not present

## 2023-07-12 DIAGNOSIS — Y842 Radiological procedure and radiotherapy as the cause of abnormal reaction of the patient, or of later complication, without mention of misadventure at the time of the procedure: Secondary | ICD-10-CM | POA: Diagnosis present

## 2023-07-12 DIAGNOSIS — D62 Acute posthemorrhagic anemia: Secondary | ICD-10-CM | POA: Diagnosis not present

## 2023-07-12 DIAGNOSIS — Z79899 Other long term (current) drug therapy: Secondary | ICD-10-CM | POA: Diagnosis not present

## 2023-07-12 DIAGNOSIS — R531 Weakness: Secondary | ICD-10-CM | POA: Diagnosis not present

## 2023-07-12 DIAGNOSIS — Z66 Do not resuscitate: Secondary | ICD-10-CM | POA: Diagnosis not present

## 2023-07-12 DIAGNOSIS — N3000 Acute cystitis without hematuria: Secondary | ICD-10-CM | POA: Diagnosis present

## 2023-07-12 DIAGNOSIS — J810 Acute pulmonary edema: Secondary | ICD-10-CM | POA: Diagnosis not present

## 2023-07-12 DIAGNOSIS — J80 Acute respiratory distress syndrome: Secondary | ICD-10-CM | POA: Diagnosis not present

## 2023-07-12 DIAGNOSIS — R5383 Other fatigue: Secondary | ICD-10-CM | POA: Diagnosis not present

## 2023-07-12 DIAGNOSIS — Z1152 Encounter for screening for COVID-19: Secondary | ICD-10-CM | POA: Diagnosis not present

## 2023-07-12 DIAGNOSIS — N189 Chronic kidney disease, unspecified: Secondary | ICD-10-CM | POA: Diagnosis not present

## 2023-07-12 DIAGNOSIS — K5730 Diverticulosis of large intestine without perforation or abscess without bleeding: Secondary | ICD-10-CM | POA: Diagnosis not present

## 2023-07-12 DIAGNOSIS — C7951 Secondary malignant neoplasm of bone: Secondary | ICD-10-CM | POA: Diagnosis present

## 2023-07-12 DIAGNOSIS — E876 Hypokalemia: Secondary | ICD-10-CM

## 2023-07-12 DIAGNOSIS — E872 Acidosis, unspecified: Secondary | ICD-10-CM | POA: Diagnosis not present

## 2023-07-12 DIAGNOSIS — K7200 Acute and subacute hepatic failure without coma: Secondary | ICD-10-CM | POA: Diagnosis not present

## 2023-07-12 DIAGNOSIS — I10 Essential (primary) hypertension: Secondary | ICD-10-CM

## 2023-07-12 DIAGNOSIS — C801 Malignant (primary) neoplasm, unspecified: Secondary | ICD-10-CM | POA: Diagnosis not present

## 2023-07-12 DIAGNOSIS — I129 Hypertensive chronic kidney disease with stage 1 through stage 4 chronic kidney disease, or unspecified chronic kidney disease: Secondary | ICD-10-CM | POA: Diagnosis not present

## 2023-07-12 DIAGNOSIS — R579 Shock, unspecified: Secondary | ICD-10-CM | POA: Diagnosis not present

## 2023-07-12 DIAGNOSIS — C7970 Secondary malignant neoplasm of unspecified adrenal gland: Secondary | ICD-10-CM | POA: Diagnosis present

## 2023-07-12 DIAGNOSIS — C7A8 Other malignant neuroendocrine tumors: Secondary | ICD-10-CM | POA: Diagnosis present

## 2023-07-12 DIAGNOSIS — J9601 Acute respiratory failure with hypoxia: Secondary | ICD-10-CM | POA: Diagnosis not present

## 2023-07-12 DIAGNOSIS — J189 Pneumonia, unspecified organism: Secondary | ICD-10-CM | POA: Diagnosis present

## 2023-07-12 DIAGNOSIS — K254 Chronic or unspecified gastric ulcer with hemorrhage: Secondary | ICD-10-CM | POA: Diagnosis present

## 2023-07-12 DIAGNOSIS — R6521 Severe sepsis with septic shock: Secondary | ICD-10-CM | POA: Diagnosis present

## 2023-07-12 DIAGNOSIS — I5033 Acute on chronic diastolic (congestive) heart failure: Secondary | ICD-10-CM | POA: Diagnosis present

## 2023-07-12 DIAGNOSIS — C787 Secondary malignant neoplasm of liver and intrahepatic bile duct: Secondary | ICD-10-CM | POA: Diagnosis present

## 2023-07-12 DIAGNOSIS — R0609 Other forms of dyspnea: Secondary | ICD-10-CM | POA: Diagnosis not present

## 2023-07-12 DIAGNOSIS — B59 Pneumocystosis: Secondary | ICD-10-CM | POA: Diagnosis not present

## 2023-07-12 DIAGNOSIS — E8809 Other disorders of plasma-protein metabolism, not elsewhere classified: Secondary | ICD-10-CM | POA: Insufficient documentation

## 2023-07-12 DIAGNOSIS — E1122 Type 2 diabetes mellitus with diabetic chronic kidney disease: Secondary | ICD-10-CM | POA: Diagnosis not present

## 2023-07-12 DIAGNOSIS — A419 Sepsis, unspecified organism: Secondary | ICD-10-CM | POA: Diagnosis present

## 2023-07-12 LAB — CBC
HCT: 26.6 % — ABNORMAL LOW (ref 39.0–52.0)
Hemoglobin: 8.8 g/dL — ABNORMAL LOW (ref 13.0–17.0)
MCH: 30.8 pg (ref 26.0–34.0)
MCHC: 33.1 g/dL (ref 30.0–36.0)
MCV: 93 fL (ref 80.0–100.0)
Platelets: 171 10*3/uL (ref 150–400)
RBC: 2.86 MIL/uL — ABNORMAL LOW (ref 4.22–5.81)
RDW: 15.7 % — ABNORMAL HIGH (ref 11.5–15.5)
WBC: 7.1 10*3/uL (ref 4.0–10.5)
nRBC: 0 % (ref 0.0–0.2)

## 2023-07-12 LAB — CBC WITH DIFFERENTIAL/PLATELET
Abs Immature Granulocytes: 0.07 10*3/uL (ref 0.00–0.07)
Basophils Absolute: 0 10*3/uL (ref 0.0–0.1)
Basophils Relative: 0 %
Eosinophils Absolute: 0 10*3/uL (ref 0.0–0.5)
Eosinophils Relative: 0 %
HCT: 29.9 % — ABNORMAL LOW (ref 39.0–52.0)
Hemoglobin: 9.2 g/dL — ABNORMAL LOW (ref 13.0–17.0)
Immature Granulocytes: 1 %
Lymphocytes Relative: 6 %
Lymphs Abs: 0.3 10*3/uL — ABNORMAL LOW (ref 0.7–4.0)
MCH: 30.6 pg (ref 26.0–34.0)
MCHC: 30.8 g/dL (ref 30.0–36.0)
MCV: 99.3 fL (ref 80.0–100.0)
Monocytes Absolute: 0.2 10*3/uL (ref 0.1–1.0)
Monocytes Relative: 4 %
Neutro Abs: 4.8 10*3/uL (ref 1.7–7.7)
Neutrophils Relative %: 89 %
Platelets: 183 10*3/uL (ref 150–400)
RBC: 3.01 MIL/uL — ABNORMAL LOW (ref 4.22–5.81)
RDW: 15.9 % — ABNORMAL HIGH (ref 11.5–15.5)
WBC: 5.4 10*3/uL (ref 4.0–10.5)
nRBC: 0 % (ref 0.0–0.2)

## 2023-07-12 LAB — COMPREHENSIVE METABOLIC PANEL
ALT: 68 U/L — ABNORMAL HIGH (ref 0–44)
ALT: 352 U/L — ABNORMAL HIGH (ref 0–44)
AST: 38 U/L (ref 15–41)
AST: 239 U/L — ABNORMAL HIGH (ref 15–41)
Albumin: 2.2 g/dL — ABNORMAL LOW (ref 3.5–5.0)
Albumin: 2.5 g/dL — ABNORMAL LOW (ref 3.5–5.0)
Alkaline Phosphatase: 83 U/L (ref 38–126)
Alkaline Phosphatase: 96 U/L (ref 38–126)
Anion gap: 15 (ref 5–15)
Anion gap: >20 — ABNORMAL HIGH (ref 5–15)
BUN: 43 mg/dL — ABNORMAL HIGH (ref 8–23)
BUN: 50 mg/dL — ABNORMAL HIGH (ref 8–23)
CO2: 23 mmol/L (ref 22–32)
CO2: 13 mmol/L — ABNORMAL LOW (ref 22–32)
Calcium: 7.5 mg/dL — ABNORMAL LOW (ref 8.9–10.3)
Calcium: 7.7 mg/dL — ABNORMAL LOW (ref 8.9–10.3)
Chloride: 105 mmol/L (ref 98–111)
Chloride: 110 mmol/L (ref 98–111)
Creatinine, Ser: 0.56 mg/dL — ABNORMAL LOW (ref 0.61–1.24)
Creatinine, Ser: 0.89 mg/dL (ref 0.61–1.24)
GFR, Estimated: >60 mL/min (ref >60)
GFR, Estimated: >60 mL/min (ref >60)
Glucose, Bld: 428 mg/dL — ABNORMAL HIGH (ref 70–99)
Glucose, Bld: 302 mg/dL — ABNORMAL HIGH (ref 70–99)
Potassium: 3.8 mmol/L (ref 3.5–5.1)
Potassium: 4.3 mmol/L (ref 3.5–5.1)
Sodium: 143 mmol/L (ref 135–145)
Sodium: 146 mmol/L — ABNORMAL HIGH (ref 135–145)
Total Bilirubin: 0.9 mg/dL (ref 0.3–1.2)
Total Bilirubin: 1 mg/dL (ref 0.3–1.2)
Total Protein: 4.9 g/dL — ABNORMAL LOW (ref 6.5–8.1)
Total Protein: 5.2 g/dL — ABNORMAL LOW (ref 6.5–8.1)

## 2023-07-12 LAB — PROTIME-INR
INR: 1.5 — ABNORMAL HIGH (ref 0.8–1.2)
Prothrombin Time: 18 s — ABNORMAL HIGH (ref 11.4–15.2)

## 2023-07-12 LAB — D-DIMER, QUANTITATIVE: D-Dimer, Quant: 1.32 ug{FEU}/mL — ABNORMAL HIGH (ref 0.00–0.50)

## 2023-07-12 LAB — CK: Total CK: 93 U/L (ref 49–397)

## 2023-07-12 LAB — LACTIC ACID, PLASMA
Lactic Acid, Venous: >9 mmol/L (ref 0.5–1.9)
Lactic Acid, Venous: >9 mmol/L (ref 0.5–1.9)
Lactic Acid, Venous: 8.1 mmol/L (ref 0.5–1.9)
Lactic Acid, Venous: 3.4 mmol/L (ref 0.5–1.9)

## 2023-07-12 LAB — TROPONIN I (HIGH SENSITIVITY)
Troponin I (High Sensitivity): 33 ng/L — ABNORMAL HIGH (ref <18)
Troponin I (High Sensitivity): 35 ng/L — ABNORMAL HIGH (ref <18)

## 2023-07-12 LAB — MRSA NEXT GEN BY PCR, NASAL: MRSA by PCR Next Gen: NOT DETECTED

## 2023-07-12 LAB — GLUCOSE, CAPILLARY
Glucose-Capillary: 409 mg/dL — ABNORMAL HIGH (ref 70–99)
Glucose-Capillary: 352 mg/dL — ABNORMAL HIGH (ref 70–99)
Glucose-Capillary: 345 mg/dL — ABNORMAL HIGH (ref 70–99)
Glucose-Capillary: 238 mg/dL — ABNORMAL HIGH (ref 70–99)
Glucose-Capillary: 305 mg/dL — ABNORMAL HIGH (ref 70–99)
Glucose-Capillary: 167 mg/dL — ABNORMAL HIGH (ref 70–99)

## 2023-07-12 LAB — PHOSPHORUS: Phosphorus: 1.2 mg/dL — ABNORMAL LOW (ref 2.5–4.6)

## 2023-07-12 LAB — APTT: aPTT: 28 s (ref 24–36)

## 2023-07-12 LAB — URIC ACID: Uric Acid, Serum: 8 mg/dL (ref 3.7–8.6)

## 2023-07-12 LAB — CORTISOL-AM, BLOOD: Cortisol - AM: 84 ug/dL — ABNORMAL HIGH (ref 6.7–22.6)

## 2023-07-12 MED ORDER — SODIUM CHLORIDE 0.9 % IV SOLN: Freq: Once | INTRAVENOUS | Status: AC

## 2023-07-12 MED ORDER — LACTATED RINGERS IV BOLUS
1000.0000 mL | Freq: Once | INTRAVENOUS | Status: AC
  Administered 2023-07-12: 1000 mL via INTRAVENOUS

## 2023-07-12 MED ORDER — SODIUM CHLORIDE 0.9 % IV SOLN
470.5000 mg | Freq: Once | INTRAVENOUS | Status: DC
  Filled 2023-07-12: qty 47

## 2023-07-12 MED ORDER — LACTATED RINGERS IV BOLUS
500.0000 mL | Freq: Once | INTRAVENOUS | Status: AC
  Administered 2023-07-13: 500 mL via INTRAVENOUS

## 2023-07-12 MED ORDER — LACTATED RINGERS IV BOLUS (SEPSIS)
1250.0000 mL | Freq: Once | INTRAVENOUS | Status: AC
  Administered 2023-07-12: 1250 mL via INTRAVENOUS

## 2023-07-12 MED ORDER — VANCOMYCIN HCL 750 MG/150ML IV SOLN
750.0000 mg | Freq: Two times a day (BID) | INTRAVENOUS | Status: DC
  Administered 2023-07-13 – 2023-07-14 (×3): 750 mg via INTRAVENOUS
  Filled 2023-07-12 (×3): qty 150

## 2023-07-12 MED ORDER — IOHEXOL 350 MG/ML SOLN
100.0000 mL | Freq: Once | INTRAVENOUS | Status: AC | PRN
  Administered 2023-07-12: 100 mL via INTRAVENOUS

## 2023-07-12 MED ORDER — MAGNESIUM SULFATE 2 GM/50ML IV SOLN
2.0000 g | Freq: Once | INTRAVENOUS | Status: AC
  Administered 2023-07-12: 2 g via INTRAVENOUS
  Filled 2023-07-12: qty 50

## 2023-07-12 MED ORDER — SODIUM CHLORIDE 0.9 % IV SOLN
1.0000 g | Freq: Three times a day (TID) | INTRAVENOUS | Status: DC
  Administered 2023-07-12 – 2023-07-14 (×6): 1 g via INTRAVENOUS
  Filled 2023-07-12 (×8): qty 20

## 2023-07-12 MED ORDER — SODIUM CHLORIDE 0.9 % IV SOLN
10.0000 mg | Freq: Once | INTRAVENOUS | Status: DC
  Filled 2023-07-12: qty 1

## 2023-07-12 MED ORDER — HYDROCORTISONE SOD SUC (PF) 100 MG IJ SOLR
100.0000 mg | Freq: Three times a day (TID) | INTRAMUSCULAR | Status: DC
  Administered 2023-07-12 – 2023-07-14 (×6): 100 mg via INTRAVENOUS
  Filled 2023-07-12 (×7): qty 2

## 2023-07-12 MED ORDER — INSULIN GLARGINE-YFGN 100 UNIT/ML ~~LOC~~ SOLN
12.0000 [IU] | Freq: Every day | SUBCUTANEOUS | Status: DC
  Administered 2023-07-12 – 2023-07-20 (×9): 12 [IU] via SUBCUTANEOUS
  Filled 2023-07-12 (×11): qty 0.12

## 2023-07-12 MED ORDER — CHLORHEXIDINE GLUCONATE CLOTH 2 % EX PADS
6.0000 | MEDICATED_PAD | Freq: Every day | CUTANEOUS | Status: DC
  Administered 2023-07-12 – 2023-07-18 (×6): 6 via TOPICAL

## 2023-07-12 MED ORDER — HOT PACK MISC ONCOLOGY: 1.0000 | Freq: Once | Status: AC | PRN

## 2023-07-12 MED ORDER — ORAL CARE MOUTH RINSE: 15.0000 mL | OROMUCOSAL | Status: DC | PRN

## 2023-07-12 MED ORDER — INSULIN ASPART 100 UNIT/ML IJ SOLN
0.0000 [IU] | Freq: Every day | INTRAMUSCULAR | Status: DC
  Administered 2023-07-15 – 2023-07-22 (×2): 2 [IU] via SUBCUTANEOUS

## 2023-07-12 MED ORDER — PALONOSETRON HCL INJECTION 0.25 MG/5ML
0.2500 mg | Freq: Once | INTRAVENOUS | Status: DC
  Filled 2023-07-12: qty 5

## 2023-07-12 MED ORDER — SODIUM CHLORIDE 0.9 % IV SOLN
150.0000 mg | Freq: Once | INTRAVENOUS | Status: DC
  Filled 2023-07-12: qty 5

## 2023-07-12 MED ORDER — VANCOMYCIN HCL 1500 MG/300ML IV SOLN
1500.0000 mg | Freq: Once | INTRAVENOUS | Status: AC
  Administered 2023-07-12: 1500 mg via INTRAVENOUS
  Filled 2023-07-12: qty 300

## 2023-07-12 MED ORDER — SODIUM CHLORIDE 0.9 % IV SOLN
80.0000 mg/m2 | Freq: Once | INTRAVENOUS | Status: DC
  Filled 2023-07-12: qty 7.4

## 2023-07-12 NOTE — Assessment & Plan Note (Deleted)
Due to cancer.

## 2023-07-12 NOTE — Sepsis Progress Note (Signed)
Notified bedside nurse of need to administer antibiotics.  

## 2023-07-12 NOTE — Progress Notes (Signed)
Progress Note   Patient: William Jones WUJ:[id number] DOB: 1951-03-19 DOA: 07/26/2023     0 DOS: the patient was seen and examined on 07/12/2023 at 10:45AM and 1:22PM      Brief hospital course: William Jones is a 72 year old M with HTN, HLD, and prostate cancer, recently admitted for generalized weakness, found to have new metastatic neuroendocrine tumor to liver and adrenals.  Was discharged home with plans for outpatient chemo start, but became profoundly weak, returned to the hospital   In the ER, mild transaminitis due to tumor, mild hypokalemia, WBC normal, no fever.  UA with pyuria.    10/6: Started on CTX, admitted for weakness, Xtandi held 10/7: Still weak but stable, Oncology consulted, planned for inpatient chemo 10/8: Patient acutely tachycardic, weak --> Lactate >9 --> transferred to ICU and antibiotics broadened, CCM consulted        Assessment and Plan: * Shock (HCC) Presumed septic shock due to UTI?  GI source?  Today, acutely worse, tachycardic, altered, lactate >9, troponins up, INR 1.5.  Having urinary frequency but no chest pain, no dyspnea, no cough.    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  Unclear source.  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   - Consult Oncology  Malnutrition of moderate degree Hypoalbuminemia As evidenced by moderate loss of subcutaneous muscle mass and fat - Continue Glucerna - Consult dietitian  Hypokalemia Resolved  Anemia of chronic disease Hgb slightly down.  No GI bleeding noted clinically. - FOBT if BM  Type 2 diabetes mellitus (HCC) Hyperglycemic. - Add semglee insulin - Continue sliding scale corrections, increase dose - Hold Jardiance  given UTI  Prostate cancer metastatic to bone (HCC) - Hold Xtandi for now  Glaucoma - Continue eyedrops  Essential hypertension Now in shock - Hold losartan and HCTZ          Subjective: Dizzy, confused, and weak today.  Became tachycardic and severely weak.  Transferred to ICU.  CT pending.     Physical Exam: BP 123/64 (BP Location: Right Arm)   Pulse (!) 117   Temp 97.7 F (36.5 C) (Axillary)   Resp 18   Ht 5\' 9"  (1.753 m)   Wt 70.9 kg   SpO2 100%   BMI 23.08 kg/m   Adult male lying in bed, appears weak and disoriented, distal extremities cold and clammy without mottling. Tachycardic, regular, no murmurs, no pitting edema, no JVD Respiratory rate seems increased, no rales or wheezes, lung sounds overall slightly diminished Abdomen soft, diffuse tenderness but no guarding, no rigidity Attention diminished, affect blunted, oriented to self only, face symmetric, severe generalized weakness    Data Reviewed: Gust with critical care CT imaging ordered Repeat metabolic panel shows marked jump in LFTs Repeat CBC unremarkable Lactate greater than 9 D-dimer elevated  The patient is critically ill with multi-organ failure.  Critical care was necessary to treat or prevent imminent or life-threatening deterioration of sepsis, cardiac failure, encephalopathy and was exclusive of separately billable procedures and treating other patients. Total critical care time spent by me: 70 minutes Time spent personally by me on obtaining history from patient or surrogate, evaluation of the patient, evaluation of patient's response to treatment, ordering and review of laboratory studies,  ordering and review of radiographic studies, ordering and performing treatments and interventions, and re-evaluation of the patient's condition.   Family Communication: Daughter by phone    Disposition: Status is: Inpatient Patient was admitted for chemo.  Plan was initially to start chemo  today and likely discharge to SNF after chemo on Thursday  Today however, he became acutely worse and will need to be stabilized and complete work up in the ICU        Author: Alberteen Sam, MD 07/12/2023 4:53 PM  For on call review [email or web address].

## 2023-07-12 NOTE — Progress Notes (Signed)
PT Cancellation Note  Patient Details Name: William Jones MRN: [id number] DOB: 04/11/1951   Cancelled Treatment:    Reason Eval/Treat Not Completed: Medical issues which prohibited therapy. PT spoke with RN, patient currently being transitioned to ICU. Not medically appropriate for therapy at this time. Will re-attempt at later date/time when medically appropriate.    Creed Copper Fairly, PT, DPT 07/12/23 3:35 PM

## 2023-07-12 NOTE — Progress Notes (Signed)
Patient came to the floor (room 1437) at 1251 from the 6th floor. Patient was ill-appearing, weak, alert and oriented, and very drowsy. Patient was hooked up to MX cardiac monitor machine. Patient's vital signs were completed. Patient's MEWS was yellow due to increased heart rate and increased respirations. BP was also soft. At 1300 lab called RN and told RN that patient had a critical lab with lactic acid of greater than 9. RN notified MD and Consulting civil engineer. Rapid Response RN was also made aware. Rapid Response RN and MD came and saw patient at the bedside. New orders were placed, as well as an order to transfer patient to ICU. RN unable to do full transfer assessment due to patient being transferred to ICU. RN administered IV Lactated Ringers 1250 mL bolus as well as other necessary medications.   At 1535 patient was transferred to ICU (room 1237). MD talked to patient's daughter. 4th floor Charge RN gave report to ICU RN receiving patient and resuming patient's care for the rest of the shift. 4th floor Charge RN and NT transported patient down to ICU (room 1237).

## 2023-07-12 NOTE — Assessment & Plan Note (Signed)
New diagnosis last admission.  Unclear source.  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   - Consult Oncology

## 2023-07-12 NOTE — Sepsis Progress Note (Signed)
Dr Maryfrances Bunnell made aware of lactic acid result >9.0.  Additional IVF ordered. Bedside RN made aware of new order for IVF bolus.

## 2023-07-12 NOTE — Consult Note (Signed)
NAME:  William Jones, MRN:  [id number], DOB:  [birth year], LOS: 0 ADMISSION DATE:  07/29/2023, CONSULTATION DATE:  10/8 REFERRING MD:  Dr. Maryfrances Bunnell, CHIEF COMPLAINT:  hypotension, lactic acidosis   History of Present Illness:  72 year old male with PMH as below, which is significant for prostate cancer, substance abuse, GI bleeding, HTN, and HLD. He takes Xtandi for metastatic prostate Ca. Recently admitted for weakness and was found to have diffuse hepatic metastatic disease and metastasis to the adrenal glands as well. 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 Primary vs mets uncertain. He was discharged and followed up with oncology 10/3 with plans to start carboplatin and etoposide 10/8, but unfortunately he once again became weak and presented to [facility] ED 10/6. He was concerned he would be unable to get the care he needed at home and would miss appointments due to feeling weak and was hopeful to be admitted to the hospital. He was admitted to the hospital for generalized weakness and possible UTI. He was seen by oncology inpatient and was scheduled to start inpatient chemotherapy, however, in the AM hours of 10/8 he became tachycardic and labs indicated worsening acidosis. Lactic acid was check and was found to be greater than 9. The patient was transferred to ICU for flower monitoring and PCCM was consulted.   Pertinent  Medical History   has a past medical history of Angiodysplasia of colon with hemorrhage (10/14/2020), Duodenitis, Essential hypertension (07/16/2015), Fatigue (11/06/2020), Glaucoma, H/O: substance abuse (HCC), Hemorrhage of rectum and anus (10/14/2020), History of frostbite, History of prostate cancer (03/20/2014), Hyperlipidemia (11/20/2019), Iron deficiency anemia, Malignant tumor of prostate (HCC) (10/14/2020), Peripheral neuropathy, S/P radiation therapy (09/18/14-11/15/14), and Thrombocytopenia (HCC).   Significant Hospital  Events: Including procedures, antibiotic start and stop dates in addition to other pertinent events   10/6 admit for generalized weakness 10/8 tx to ICU for LA 9  Interim History / Subjective:    Objective   Blood pressure (!) 95/58, pulse (!) 117, temperature (!) 97.5 F (36.4 C), temperature source Rectal, resp. rate (!) 23, height 5\' 9"  (1.753 m), weight 68 kg, SpO2 100%.        Intake/Output Summary (Last 24 hours) at 07/12/2023 1359 Last data filed at 07/12/2023 1357 Gross per 24 hour  Intake 487.53 ml  Output 1475 ml  Net -987.47 ml   Filed Weights   07/16/2023 1145  Weight: 68 kg    Examination: General: adult male of normal body habitus in NAD HENT: [place]/AT, PERRL, no JVD Lungs: Coarse crackles R base Cardiovascular: Tachy, regular, murmur Abdomen: Soft, NT, ND Extremities: No acute deformity Neuro: Slurring speech. Moving all extremities with good strength to command. No facial droop. Tongue midline.    10/6 urine culture > 100k colonies kleb oxytoca >>>  Resolved Hospital Problem list     Assessment & Plan:   Lactic acidosis: undetectably high. Etiology not entirely clear. Given liver disease, lactic acid is likely to accumulate and be slow to clear. Has not been hypotensive to a significant degree. Not on metformin or other medication known to cause lactic acidosis. It it possible he had a seizure at some point with how quickly this has changed? MRI brain from 9/26 with no intracranial metastatic disease. Will need to rule out TLS as well although less likely as he has not received chemo. Blood pressure improved with volume resuscitation.  - Transfer to SDU for closer monitoring - CT chest/abdomen reasonable to further  investigate for source.  - Trend lactic - Cultures pending - Broad spectrum antibiotics until sepsis can be ruled out.  - Check uric acid, phos, and repeat chemistry  UTI - klebsiella oxytoca - Continue meropenem and vancomycin   Small cell  carcinoma of liver, adrenals: Uncertain if liver is primary or a metastatic site with unknown primary.  - Management per oncology, primary - If BP becomes an issues consider stress dose steroids.  - Trend LFT  DM - management per primary  Prostate cancer (adeno) with bone mets - management per oncology - Holding home Su Monks, Lupron  Best Practice (right click and "Reselect all SmartList Selections" daily)   Per primary  Labs   CBC: Recent Labs  Lab 07/07/23 1547 08/07/23 1433 07/11/23 0519 07/12/23 0546 07/12/23 1118  WBC 6.4 6.1 6.6 7.1 5.4  NEUTROABS 5.6 5.5  --   --  4.8  HGB 12.1* 10.8* 10.2* 8.8* 9.2*  HCT 35.0* 32.0* 31.6* 26.6* 29.9*  MCV 88.2 91.7 94.6 93.0 99.3  PLT 200 170 164 171 183    Basic Metabolic Panel: Recent Labs  Lab 07/07/23 1547 Aug 07, 2023 1433 07/11/23 0519 07/12/23 0546 07/12/23 1118  NA 138 135 138 143 146*  K 2.8* 3.0* 3.8 3.8 4.3  CL 100 96* 99 105 110  CO2 24 23 25 23  13*  GLUCOSE 248* 267* 344* 428* 302*  BUN 32* 26* 19 43* 50*  CREATININE 1.02 0.72 0.64 0.56* 0.89  CALCIUM 7.7* 7.4* 7.3* 7.5* 7.7*   GFR: Estimated Creatinine Clearance: 74.3 mL/min (by C-G formula based on SCr of 0.89 mg/dL). Recent Labs  Lab 08/07/23 1433 08/07/2023 1527 07/11/23 0519 07/12/23 0546 07/12/23 1118 07/12/23 1215  WBC 6.1  --  6.6 7.1 5.4  --   LATICACIDVEN 1.2 1.4  --   --  >9.0* >9.0*    Liver Function Tests: Recent Labs  Lab 07/07/23 1547 Aug 07, 2023 1433 07/12/23 0546 07/12/23 1118  AST 50* 50* 38 239*  ALT 129* 90* 68* 352*  ALKPHOS 123 100 83 96  BILITOT 0.7 1.1 0.9 1.0  PROT 6.5 5.9* 4.9* 5.2*  ALBUMIN 3.7 2.9* 2.2* 2.5*   No results for input(s): "LIPASE", "AMYLASE" in the last 168 hours. Recent Labs  Lab 2023/08/07 1433  AMMONIA 22    ABG    Component Value Date/Time   HCO3 24.3 06/29/2023 2313   TCO2 25 06/29/2023 2313   O2SAT 99 06/29/2023 2313     Coagulation Profile: Recent Labs  Lab Aug 07, 2023 1433  07/12/23 1118  INR 1.1 1.5*    Cardiac Enzymes: Recent Labs  Lab 07/12/23 0546  CKTOTAL 93    HbA1C: Hemoglobin A1C  Date/Time Value Ref Range Status  06/24/2023 08:55 AM 6.5 (A) 4.0 - 5.6 % Final  10/20/2022 10:41 AM 6.7 (A) 4.0 - 5.6 % Final    CBG: Recent Labs  Lab 07/11/23 2125 07/12/23 0747 07/12/23 0910 07/12/23 1026 07/12/23 1324  GLUCAP 248* 409* 352* 345* 238*    Review of Systems:   Bolds are positive  Constitutional: weight loss, gain, night sweats, Fevers, chills, fatigue .  HEENT: headaches, Sore throat, sneezing, nasal congestion, post nasal drip, Difficulty swallowing, Tooth/dental problems, visual complaints visual changes, ear ache CV:  chest pain, radiates:,Orthopnea, PND, swelling in lower extremities, dizziness, palpitations, syncope.  GI  heartburn, indigestion, abdominal pain, nausea, vomiting, diarrhea, change in bowel habits, loss of appetite, bloody stools.  Resp: cough, productive: , hemoptysis, dyspnea, chest pain, pleuritic.  Skin: rash  or itching or icterus GU: dysuria, change in color of urine, urgency or frequency. flank pain, hematuria  MS: joint pain or swelling. decreased range of motion  Psych: change in mood or affect. depression or anxiety.  Neuro: difficulty with speech, weakness, numbness, ataxia    Past Medical History:  He,  has a past medical history of Angiodysplasia of colon with hemorrhage (10/14/2020), Duodenitis, Essential hypertension (07/16/2015), Fatigue (11/06/2020), Glaucoma, H/O: substance abuse (HCC), Hemorrhage of rectum and anus (10/14/2020), History of frostbite, History of prostate cancer (03/20/2014), Hyperlipidemia (11/20/2019), Iron deficiency anemia, Malignant tumor of prostate (HCC) (10/14/2020), Peripheral neuropathy, S/P radiation therapy (09/18/14-11/15/14), and Thrombocytopenia (HCC).   Surgical History:   Past Surgical History:  Procedure Laterality Date   BIOPSY  06/11/2023   Procedure: BIOPSY;   Surgeon: Benancio Deeds, MD;  Location: [facility] ENDOSCOPY;  Service: Gastroenterology;;   COLONOSCOPY N/A 01/10/2014   Procedure: COLONOSCOPY;  Surgeon: Theda Belfast, MD;  Location: [facility] ENDOSCOPY;  Service: Endoscopy;  Laterality: N/A;   ESOPHAGOGASTRODUODENOSCOPY N/A 01/10/2014   Procedure: ESOPHAGOGASTRODUODENOSCOPY (EGD);  Surgeon: Theda Belfast, MD;  Location: [facility] ENDOSCOPY;  Service: Endoscopy;  Laterality: N/A;   ESOPHAGOGASTRODUODENOSCOPY (EGD) WITH PROPOFOL N/A 06/11/2023   Procedure: ESOPHAGOGASTRODUODENOSCOPY (EGD) WITH PROPOFOL;  Surgeon: Benancio Deeds, MD;  Location[facility] ENDOSCOPY;  Service: Gastroenterology;  Laterality: N/A;   FLEXIBLE SIGMOIDOSCOPY N/A 09/19/2015   Procedure: FLEXIBLE SIGMOIDOSCOPY;  Surgeon: Jeani Hawking, MD;  Location: [facility] ENDOSCOPY;  Service: Endoscopy;  Laterality: N/A;   FLEXIBLE SIGMOIDOSCOPY N/A 06/03/2017   Procedure: FLEXIBLE SIGMOIDOSCOPY;  Surgeon: Jeani Hawking, MD;  Location: [facility] ENDOSCOPY;  Service: Endoscopy;  Laterality: N/A;   FLEXIBLE SIGMOIDOSCOPY N/A 01/06/2018   Procedure: FLEXIBLE SIGMOIDOSCOPY;  Surgeon: Jeani Hawking, MD;  Location: [facility] ENDOSCOPY;  Service: Endoscopy;  Laterality: N/A;   GIVENS CAPSULE STUDY N/A 01/10/2014   Procedure: GIVENS CAPSULE STUDY;  Surgeon: Theda Belfast, MD;  Location: [facility] ENDOSCOPY;  Service: Endoscopy;  Laterality: N/A;   HOT HEMOSTASIS N/A 09/19/2015   Procedure: HOT HEMOSTASIS (ARGON PLASMA COAGULATION/BICAP);  Surgeon: Jeani Hawking, MD;  Location: [facility] ENDOSCOPY;  Service: Endoscopy;  Laterality: N/A;   HOT HEMOSTASIS N/A 06/03/2017   Procedure: HOT HEMOSTASIS (ARGON PLASMA COAGULATION/BICAP);  Surgeon: Jeani Hawking, MD;  Location: [facility] ENDOSCOPY;  Service: Endoscopy;  Laterality: N/A;   PROSTATE BIOPSY  03/05/14   Gleason 7, vol 45 gm     Social History:   reports that he quit smoking about 15 years ago. His smoking use included cigarettes. He started smoking about 45 years ago. He has a 15 pack-year smoking  history. He has never used smokeless tobacco. He reports that he does not currently use alcohol. He reports that he does not use drugs.   Family History:  His family history includes Arthritis in his sister; Edema in his mother; Kidney failure (age of onset: 10) in his brother; Stroke in his maternal uncle. There is no history of Cancer.   Allergies Allergies  Allergen Reactions   Metformin And Related Other (See Comments)    Bad headaches   Pantoprazole Other (See Comments)    "Made me see things. I cannot function while taking this."     Home Medications  Prior to Admission medications   Medication Sig Start Date End Date Taking? Authorizing Provider  Calcium Carbonate (CALCIUM 600 PO) Take 600 mg by mouth daily.   Yes [provider]  Cholecalciferol (VITAMIN D3) 1000 units CAPS Take 1,000 Units by mouth daily.   Yes [provider]  dorzolamide (TRUSOPT) 2 % ophthalmic solution Place 1 drop into the right eye in the morning and at bedtime.   Yes [provider]  empagliflozin (JARDIANCE) 10 MG TABS tablet Take 1 tablet (10 mg total) by mouth daily before breakfast. 06/24/23  Yes Monna Fam, MD  ferrous sulfate 325 (65 FE) MG tablet Take 325 mg by mouth daily with breakfast.   Yes [provider]  hydrochlorothiazide (HYDRODIURIL) 12.5 MG tablet Take 1 tablet (12.5 mg total) by mouth daily. 07/06/23  Yes Morrie Sheldon, MD  losartan (COZAAR) 50 MG tablet Take 1 tablet (50 mg total) by mouth daily. 07/06/23  Yes Morrie Sheldon, MD  Multiple Vitamins-Minerals (ONE-A-DAY PROACTIVE 65+) TABS Take 1 tablet by mouth daily.   Yes [provider]  potassium chloride (MICRO-K) 10 MEQ CR capsule Take 2 capsules (20 mEq total) by mouth daily. Patient taking differently: Take 10 mEq by mouth in the morning and at bedtime. 07/08/23  Yes Rachel Moulds, MD  timolol (BETIMOL) 0.5 % ophthalmic solution Place 1 drop into the right eye in the morning and at  bedtime.   Yes [provider]  triamcinolone (KENALOG) 0.025 % ointment Apply 1 Application topically 2 (two) times daily as needed (for rashes or itching).   Yes [provider]  vitamin E 180 MG (400 UNITS) capsule Take 400 Units by mouth daily.   Yes [provider]  pantoprazole (PROTONIX) 40 MG tablet Take 1 tablet (40 mg total) by mouth daily. Patient not taking: Reported on 07/08/2023 06/24/23 06/23/24  Monna Fam, MD     Critical care time:      Joneen Roach, AGACNP-BC [facility]  See Amion for personal pager PCCM on call pager [phone] until 7pm. Please call Elink 7p-7a. [phone]  07/12/2023 4:10 PM

## 2023-07-12 NOTE — Progress Notes (Signed)
   07/12/23 1040  Assess: MEWS Score  BP 107/74  Pulse Rate (!) 130  Resp (!) 22  Level of Consciousness Alert  SpO2 100 %  O2 Device Room Air  Assess: MEWS Score  MEWS Temp 0  MEWS Systolic 0  MEWS Pulse 3  MEWS RR 1  MEWS LOC 0  MEWS Score 4  MEWS Score Color Red  Assess: if the MEWS score is Yellow or Red  Were vital signs accurate and taken at a resting state? Yes  MEWS guidelines implemented  Yes, red  Treat  MEWS Interventions Considered administering scheduled or prn medications/treatments as ordered  Take Vital Signs  Increase Vital Sign Frequency  Red: Q1hr x2, continue Q4hrs until patient remains green for 12hrs  Escalate  MEWS: Escalate Red: Discuss with charge nurse and notify provider. Consider notifying RRT. If remains red for 2 hours consider need for higher level of care  Notify: Charge Nurse/RN  Name of Charge Nurse/RN Notified KIM ,RN  Provider Notification  Provider Name/Title Joen Laura  Date Provider Notified 07/12/23  Time Provider Notified 1140  Method of Notification Face-to-face  Notification Reason Change in status  Provider response At bedside  Date of Provider Response 07/12/23  Notify: Rapid Response  Name of Rapid Response RN Notified Britta Mccreedy ,RN  Date Rapid Response Notified 07/12/23  Time Rapid Response Notified 1015  Assess: SIRS CRITERIA  SIRS Temperature  0  SIRS Pulse 1  SIRS Respirations  1  SIRS WBC 0  SIRS Score Sum  2

## 2023-07-12 NOTE — Progress Notes (Signed)
   07/12/23 1300  Assess: MEWS Score  Temp 97.7 F (36.5 C)  BP (!) 95/58  MAP (mmHg) 70  Pulse Rate (!) 117  ECG Heart Rate (!) 103  Resp (!) 23  Level of Consciousness Alert  SpO2 100 %  O2 Device Room Air  Patient Activity (if Appropriate) In bed  Assess: MEWS Score  MEWS Temp 0  MEWS Systolic 1  MEWS Pulse 1  MEWS RR 1  MEWS LOC 0  MEWS Score 3  MEWS Score Color Yellow  Assess: if the MEWS score is Yellow or Red  Were vital signs accurate and taken at a resting state? Yes  Does the patient meet 2 or more of the SIRS criteria? Yes  Does the patient have a confirmed or suspected source of infection? Yes  MEWS guidelines implemented  Yes, yellow  Treat  MEWS Interventions Considered administering scheduled or prn medications/treatments as ordered  Take Vital Signs  Increase Vital Sign Frequency  Yellow: Q2hr x1, continue Q4hrs until patient remains green for 12hrs  Escalate  MEWS: Escalate Yellow: Discuss with charge nurse and consider notifying provider and/or RRT  Notify: Charge Nurse/RN  Name of Charge Nurse/RN Notified Renda Rolls, RN  Provider Notification  Provider Name/Title Joen Laura, MD  Date Provider Notified 07/12/23  Time Provider Notified 1305  Method of Notification Page  Notification Reason Critical Result  Provider response At bedside;See new orders  Date of Provider Response 07/12/23  Time of Provider Response 1306  Notify: Rapid Response  Name of Rapid Response RN Notified [facility] May, Rapid Response RN  Date Rapid Response Notified 07/12/23  Time Rapid Response Notified 1313  Assess: SIRS CRITERIA  SIRS Temperature  0  SIRS Pulse 1  SIRS Respirations  1  SIRS WBC 0  SIRS Score Sum  2

## 2023-07-12 NOTE — Sepsis Progress Note (Signed)
Code Sepsis protocol being monitored by eLink. 

## 2023-07-12 NOTE — Progress Notes (Signed)
Pharmacy Antibiotic Note  William Jones is a 72 y.o. male with history of prostate cancer with mets to bone on Xgeva, Xtandi, and lupon PTA, recent biopsy also showed small cell carcinoma. He presented to the ED 07/10/23 with c/o of generalized weakness. He was started on ceftriaxone on admission for suspected UTI. Urine culture collected on 10/6 resulted in klebsiella oxytoca. On 07/12/2023, pharmacy has been consulted to change antibiotic to meropenem for sepsis. Patient also scheduled to receive IV chemo on 10/8.   Plan: - Meropenem 1g IV q8h - Monitoring: Renal  function (Scr), clinical status and culture result   Height: 5\' 9"  (175.3 cm) Weight: 68 kg (150 lb) IBW/kg (Calculated) : 70.7  Temp (24hrs), Avg:97.7 F (36.5 C), Min:97.5 F (36.4 C), Max:97.9 F (36.6 C)  Recent Labs  Lab 07/07/23 1547 07/10/23 1433 07/10/23 1527 07/11/23 0519 07/12/23 0546 07/12/23 1118  WBC 6.4 6.1  --  6.6 7.1 5.4  CREATININE 1.02 0.72  --  0.64 0.56*  --   LATICACIDVEN  --  1.2 1.4  --   --   --     Estimated Creatinine Clearance: 82.6 mL/min (A) (by C-G formula based on SCr of 0.56 mg/dL (L)).    Allergies  Allergen Reactions   Metformin And Related Other (See Comments)    Bad headaches   Pantoprazole Other (See Comments)    "Made me see things. I cannot function while taking this."    Antimicrobials this admission: Meropenem 10/8 >>  Ceftriaxone 10/6>> 10/8   Microbiology results: 10/8 BCx: Pending 10/6 UCx: >=100,000 COLONIES/mL KLEBSIELLA OXYTOCA  10/6 Respiratory panel PCR: Negative for COVID, Influenza, RSV  Thank you for allowing pharmacy to be a part of this patient's care.  Lucyann Romano 07/12/2023 12:07 PM

## 2023-07-13 ENCOUNTER — Inpatient Hospital Stay: Payer: 59

## 2023-07-13 ENCOUNTER — Encounter: Payer: Self-pay | Admitting: Hematology and Oncology

## 2023-07-13 ENCOUNTER — Other Ambulatory Visit: Payer: Self-pay

## 2023-07-13 DIAGNOSIS — R579 Shock, unspecified: Secondary | ICD-10-CM | POA: Diagnosis not present

## 2023-07-13 LAB — CBC
HCT: 20 % — ABNORMAL LOW (ref 39.0–52.0)
Hemoglobin: 6.4 g/dL — CL (ref 13.0–17.0)
MCH: 30.3 pg (ref 26.0–34.0)
MCHC: 32 g/dL (ref 30.0–36.0)
MCV: 94.8 fL (ref 80.0–100.0)
Platelets: 116 10*3/uL — ABNORMAL LOW (ref 150–400)
RBC: 2.11 MIL/uL — ABNORMAL LOW (ref 4.22–5.81)
RDW: 15.9 % — ABNORMAL HIGH (ref 11.5–15.5)
WBC: 6.1 10*3/uL (ref 4.0–10.5)
nRBC: 0 % (ref 0.0–0.2)

## 2023-07-13 LAB — GLUCOSE, CAPILLARY
Glucose-Capillary: 161 mg/dL — ABNORMAL HIGH (ref 70–99)
Glucose-Capillary: 293 mg/dL — ABNORMAL HIGH (ref 70–99)
Glucose-Capillary: 138 mg/dL — ABNORMAL HIGH (ref 70–99)
Glucose-Capillary: 130 mg/dL — ABNORMAL HIGH (ref 70–99)
Glucose-Capillary: 151 mg/dL — ABNORMAL HIGH (ref 70–99)

## 2023-07-13 LAB — COMPREHENSIVE METABOLIC PANEL
ALT: 3140 U/L — ABNORMAL HIGH (ref 0–44)
AST: 3101 U/L — ABNORMAL HIGH (ref 15–41)
Albumin: 2.1 g/dL — ABNORMAL LOW (ref 3.5–5.0)
Alkaline Phosphatase: 74 U/L (ref 38–126)
Anion gap: 9 (ref 5–15)
BUN: 39 mg/dL — ABNORMAL HIGH (ref 8–23)
CO2: 26 mmol/L (ref 22–32)
Calcium: 7.1 mg/dL — ABNORMAL LOW (ref 8.9–10.3)
Chloride: 111 mmol/L (ref 98–111)
Creatinine, Ser: 0.52 mg/dL — ABNORMAL LOW (ref 0.61–1.24)
GFR, Estimated: >60 mL/min (ref >60)
Glucose, Bld: 193 mg/dL — ABNORMAL HIGH (ref 70–99)
Potassium: 3.2 mmol/L — ABNORMAL LOW (ref 3.5–5.1)
Sodium: 146 mmol/L — ABNORMAL HIGH (ref 135–145)
Total Bilirubin: 0.9 mg/dL (ref 0.3–1.2)
Total Protein: 4.5 g/dL — ABNORMAL LOW (ref 6.5–8.1)

## 2023-07-13 LAB — URINE CULTURE: Culture: >=100000 — AB

## 2023-07-13 LAB — HEMOGLOBIN AND HEMATOCRIT, BLOOD
HCT: 25.7 % — ABNORMAL LOW (ref 39.0–52.0)
Hemoglobin: 8.6 g/dL — ABNORMAL LOW (ref 13.0–17.0)

## 2023-07-13 LAB — PREPARE RBC (CROSSMATCH)

## 2023-07-13 MED ORDER — PANTOPRAZOLE SODIUM 40 MG IV SOLR
40.0000 mg | Freq: Two times a day (BID) | INTRAVENOUS | Status: DC
  Administered 2023-07-13 – 2023-07-17 (×8): 40 mg via INTRAVENOUS
  Filled 2023-07-13 (×8): qty 10

## 2023-07-13 MED ORDER — FAMOTIDINE IN NACL 20-0.9 MG/50ML-% IV SOLN
20.0000 mg | Freq: Two times a day (BID) | INTRAVENOUS | Status: DC
  Administered 2023-07-13: 20 mg via INTRAVENOUS
  Filled 2023-07-13: qty 50

## 2023-07-13 MED ORDER — POTASSIUM PHOSPHATES 15 MMOLE/5ML IV SOLN
30.0000 mmol | Freq: Once | INTRAVENOUS | Status: AC
  Administered 2023-07-13: 30 mmol via INTRAVENOUS
  Filled 2023-07-13: qty 10

## 2023-07-13 MED ORDER — SODIUM CHLORIDE 0.9% IV SOLUTION: Freq: Once | INTRAVENOUS | Status: DC

## 2023-07-13 MED ORDER — SODIUM CHLORIDE 0.9% FLUSH
3.0000 mL | Freq: Two times a day (BID) | INTRAVENOUS | Status: DC
  Administered 2023-07-13 – 2023-07-23 (×20): 3 mL via INTRAVENOUS
  Administered 2023-07-24: 10 mL via INTRAVENOUS
  Administered 2023-07-24 – 2023-08-04 (×23): 3 mL via INTRAVENOUS

## 2023-07-13 NOTE — TOC Progression Note (Signed)
Transition of Care [facility]) - Progression Note    Patient Details  Name: William Jones MRN: [id number] Date of Birth: [birth year]  Transition of Care [facility] CM/SW Contact  Jordanna Hendrie, Olegario Messier, RN Phone Number: 07/13/2023, 11:09 AM  Clinical Narrative: Sherron Monday to Tierney(dtr) about  d/c plans-she agrees to ST SNF-provided w/bed offers-await choice. Informed her of cnare meds @ home to have available for ST SNF if appropriate d/t cost(maybe too expensive for facilities pharmacy-she agrees to have available, too consider following up with cancer center for scheduled appts for chemo if appropriate she is aware of tel#(she will f/u). Await choice, med stablility within 24-48hrs of d/c prior getting auth.     Expected Discharge Plan: Skilled Nursing Facility Barriers to Discharge: Continued Medical Work up  Expected Discharge Plan and Services In-house Referral: NA Discharge Planning Services: NA Post Acute Care Choice: Skilled Nursing Facility Living arrangements for the past 2 months: Apartment                                       Social Determinants of Health (SDOH) Interventions SDOH Screenings   Food Insecurity: No Food Insecurity (06/30/2023)  Housing: Patient Declined (06/30/2023)  Transportation Needs: No Transportation Needs (06/30/2023)  Utilities: Not At Risk (06/30/2023)  Alcohol Screen: Low Risk  (10/20/2022)  Depression (PHQ2-9): Low Risk  (06/24/2023)  Financial Resource Strain: Low Risk  (06/25/2020)  Physical Activity: Sufficiently Active (06/25/2020)  Social Connections: Moderately Integrated (06/25/2020)  Stress: No Stress Concern Present (10/20/2022)  Tobacco Use: Medium Risk (07/11/2023)    Readmission Risk Interventions    07/04/2023    1:50 PM  Readmission Risk Prevention Plan  Transportation Screening Complete  PCP or Specialist Appt within 3-5 Days Complete  HRI or Home Care Consult Complete  Social Work Consult for Recovery Care Planning/Counseling  Complete  Palliative Care Screening Complete  Medication Review Oceanographer) Complete

## 2023-07-13 NOTE — Progress Notes (Signed)
macronodular cirrhosis is not entirely discarded but seems less likely given the appearance. Electronically Signed   By: Gaylyn Rong M.D.   On: 06/30/2023 13:45   ECHOCARDIOGRAM COMPLETE  Result Date: 06/30/2023    ECHOCARDIOGRAM REPORT   Patient Name:   William Jones Date of Exam: 06/30/2023 Medical Rec #:  [id number]             Height:       68.0 in Accession #:    [id number]            Weight:       175.7 lb Date of Birth:  09-01-1951              BSA:          1.934 m Patient Age:    72 years              BP:           146/71 mmHg Patient Gender: M                     HR:           71 bpm. Exam Location:  Inpatient Procedure: 2D Echo, Cardiac Doppler and Color Doppler Indications:    R06.9 DOE; R60.0 Lower extremity edema; R53.83 Fatigue  History:        Patient has no prior history of Echocardiogram examinations.                 Signs/Symptoms:Dyspnea, Edema and Fatigue; Risk                 Factors:Hypertension, Diabetes, Former Smoker, History of                 substance abuse. and Dyslipidemia. Patient denies chest pain. He                 does have DOE, leg edema and extreme fatigue since 06/06/2023.  Sonographer:    Carlos American RVT, RDCS (AE), RDMS Referring Phys: [id number] EMILY B MULLEN  Sonographer Comments: Suboptimal apical window. Image acquisition challenging due to respiratory motion. IMPRESSIONS  1. Left ventricular ejection fraction, by estimation, is 60 to 65%. The left ventricle has normal function. The left ventricle has no regional wall motion abnormalities. There is mild concentric left ventricular hypertrophy. Left ventricular diastolic parameters are consistent with Grade I diastolic dysfunction (impaired relaxation).  2. Right ventricular systolic function is normal. The right ventricular size  is normal.  3. The mitral valve is normal in structure. Trivial mitral valve regurgitation. No evidence of mitral stenosis.  4. The aortic valve is normal in structure. Aortic valve regurgitation is not visualized. Aortic valve sclerosis/calcification is present, without any evidence of aortic stenosis.  5. The inferior vena cava is normal in size with greater than 50% respiratory variability, suggesting right atrial pressure of 3 mmHg. FINDINGS  Left Ventricle: Left ventricular ejection fraction, by estimation, is 60 to 65%. The left ventricle has normal function. The left ventricle has no regional wall motion abnormalities. The left ventricular internal cavity size was normal in size. There is  mild concentric left ventricular hypertrophy. Left ventricular diastolic parameters are consistent with Grade I diastolic dysfunction (impaired relaxation). Right Ventricle: The right ventricular size is normal. No increase in right ventricular wall thickness. Right ventricular systolic function is normal. Left Atrium: Left atrial size was normal in size. Right Atrium: Right atrial size was normal in size. Pericardium: There  macronodular cirrhosis is not entirely discarded but seems less likely given the appearance. Electronically Signed   By: Gaylyn Rong M.D.   On: 06/30/2023 13:45   ECHOCARDIOGRAM COMPLETE  Result Date: 06/30/2023    ECHOCARDIOGRAM REPORT   Patient Name:   William Jones Date of Exam: 06/30/2023 Medical Rec #:  [id number]             Height:       68.0 in Accession #:    [id number]            Weight:       175.7 lb Date of Birth:  09-01-1951              BSA:          1.934 m Patient Age:    72 years              BP:           146/71 mmHg Patient Gender: M                     HR:           71 bpm. Exam Location:  Inpatient Procedure: 2D Echo, Cardiac Doppler and Color Doppler Indications:    R06.9 DOE; R60.0 Lower extremity edema; R53.83 Fatigue  History:        Patient has no prior history of Echocardiogram examinations.                 Signs/Symptoms:Dyspnea, Edema and Fatigue; Risk                 Factors:Hypertension, Diabetes, Former Smoker, History of                 substance abuse. and Dyslipidemia. Patient denies chest pain. He                 does have DOE, leg edema and extreme fatigue since 06/06/2023.  Sonographer:    Carlos American RVT, RDCS (AE), RDMS Referring Phys: [id number] EMILY B MULLEN  Sonographer Comments: Suboptimal apical window. Image acquisition challenging due to respiratory motion. IMPRESSIONS  1. Left ventricular ejection fraction, by estimation, is 60 to 65%. The left ventricle has normal function. The left ventricle has no regional wall motion abnormalities. There is mild concentric left ventricular hypertrophy. Left ventricular diastolic parameters are consistent with Grade I diastolic dysfunction (impaired relaxation).  2. Right ventricular systolic function is normal. The right ventricular size  is normal.  3. The mitral valve is normal in structure. Trivial mitral valve regurgitation. No evidence of mitral stenosis.  4. The aortic valve is normal in structure. Aortic valve regurgitation is not visualized. Aortic valve sclerosis/calcification is present, without any evidence of aortic stenosis.  5. The inferior vena cava is normal in size with greater than 50% respiratory variability, suggesting right atrial pressure of 3 mmHg. FINDINGS  Left Ventricle: Left ventricular ejection fraction, by estimation, is 60 to 65%. The left ventricle has normal function. The left ventricle has no regional wall motion abnormalities. The left ventricular internal cavity size was normal in size. There is  mild concentric left ventricular hypertrophy. Left ventricular diastolic parameters are consistent with Grade I diastolic dysfunction (impaired relaxation). Right Ventricle: The right ventricular size is normal. No increase in right ventricular wall thickness. Right ventricular systolic function is normal. Left Atrium: Left atrial size was normal in size. Right Atrium: Right atrial size was normal in size. Pericardium: There  macronodular cirrhosis is not entirely discarded but seems less likely given the appearance. Electronically Signed   By: Gaylyn Rong M.D.   On: 06/30/2023 13:45   ECHOCARDIOGRAM COMPLETE  Result Date: 06/30/2023    ECHOCARDIOGRAM REPORT   Patient Name:   William Jones Date of Exam: 06/30/2023 Medical Rec #:  [id number]             Height:       68.0 in Accession #:    [id number]            Weight:       175.7 lb Date of Birth:  09-01-1951              BSA:          1.934 m Patient Age:    72 years              BP:           146/71 mmHg Patient Gender: M                     HR:           71 bpm. Exam Location:  Inpatient Procedure: 2D Echo, Cardiac Doppler and Color Doppler Indications:    R06.9 DOE; R60.0 Lower extremity edema; R53.83 Fatigue  History:        Patient has no prior history of Echocardiogram examinations.                 Signs/Symptoms:Dyspnea, Edema and Fatigue; Risk                 Factors:Hypertension, Diabetes, Former Smoker, History of                 substance abuse. and Dyslipidemia. Patient denies chest pain. He                 does have DOE, leg edema and extreme fatigue since 06/06/2023.  Sonographer:    Carlos American RVT, RDCS (AE), RDMS Referring Phys: [id number] EMILY B MULLEN  Sonographer Comments: Suboptimal apical window. Image acquisition challenging due to respiratory motion. IMPRESSIONS  1. Left ventricular ejection fraction, by estimation, is 60 to 65%. The left ventricle has normal function. The left ventricle has no regional wall motion abnormalities. There is mild concentric left ventricular hypertrophy. Left ventricular diastolic parameters are consistent with Grade I diastolic dysfunction (impaired relaxation).  2. Right ventricular systolic function is normal. The right ventricular size  is normal.  3. The mitral valve is normal in structure. Trivial mitral valve regurgitation. No evidence of mitral stenosis.  4. The aortic valve is normal in structure. Aortic valve regurgitation is not visualized. Aortic valve sclerosis/calcification is present, without any evidence of aortic stenosis.  5. The inferior vena cava is normal in size with greater than 50% respiratory variability, suggesting right atrial pressure of 3 mmHg. FINDINGS  Left Ventricle: Left ventricular ejection fraction, by estimation, is 60 to 65%. The left ventricle has normal function. The left ventricle has no regional wall motion abnormalities. The left ventricular internal cavity size was normal in size. There is  mild concentric left ventricular hypertrophy. Left ventricular diastolic parameters are consistent with Grade I diastolic dysfunction (impaired relaxation). Right Ventricle: The right ventricular size is normal. No increase in right ventricular wall thickness. Right ventricular systolic function is normal. Left Atrium: Left atrial size was normal in size. Right Atrium: Right atrial size was normal in size. Pericardium: There  macronodular cirrhosis is not entirely discarded but seems less likely given the appearance. Electronically Signed   By: Gaylyn Rong M.D.   On: 06/30/2023 13:45   ECHOCARDIOGRAM COMPLETE  Result Date: 06/30/2023    ECHOCARDIOGRAM REPORT   Patient Name:   William Jones Date of Exam: 06/30/2023 Medical Rec #:  [id number]             Height:       68.0 in Accession #:    [id number]            Weight:       175.7 lb Date of Birth:  09-01-1951              BSA:          1.934 m Patient Age:    72 years              BP:           146/71 mmHg Patient Gender: M                     HR:           71 bpm. Exam Location:  Inpatient Procedure: 2D Echo, Cardiac Doppler and Color Doppler Indications:    R06.9 DOE; R60.0 Lower extremity edema; R53.83 Fatigue  History:        Patient has no prior history of Echocardiogram examinations.                 Signs/Symptoms:Dyspnea, Edema and Fatigue; Risk                 Factors:Hypertension, Diabetes, Former Smoker, History of                 substance abuse. and Dyslipidemia. Patient denies chest pain. He                 does have DOE, leg edema and extreme fatigue since 06/06/2023.  Sonographer:    Carlos American RVT, RDCS (AE), RDMS Referring Phys: [id number] EMILY B MULLEN  Sonographer Comments: Suboptimal apical window. Image acquisition challenging due to respiratory motion. IMPRESSIONS  1. Left ventricular ejection fraction, by estimation, is 60 to 65%. The left ventricle has normal function. The left ventricle has no regional wall motion abnormalities. There is mild concentric left ventricular hypertrophy. Left ventricular diastolic parameters are consistent with Grade I diastolic dysfunction (impaired relaxation).  2. Right ventricular systolic function is normal. The right ventricular size  is normal.  3. The mitral valve is normal in structure. Trivial mitral valve regurgitation. No evidence of mitral stenosis.  4. The aortic valve is normal in structure. Aortic valve regurgitation is not visualized. Aortic valve sclerosis/calcification is present, without any evidence of aortic stenosis.  5. The inferior vena cava is normal in size with greater than 50% respiratory variability, suggesting right atrial pressure of 3 mmHg. FINDINGS  Left Ventricle: Left ventricular ejection fraction, by estimation, is 60 to 65%. The left ventricle has normal function. The left ventricle has no regional wall motion abnormalities. The left ventricular internal cavity size was normal in size. There is  mild concentric left ventricular hypertrophy. Left ventricular diastolic parameters are consistent with Grade I diastolic dysfunction (impaired relaxation). Right Ventricle: The right ventricular size is normal. No increase in right ventricular wall thickness. Right ventricular systolic function is normal. Left Atrium: Left atrial size was normal in size. Right Atrium: Right atrial size was normal in size. Pericardium: There  wall thickening visualized. No sonographic Murphy sign noted by sonographer. Common bile duct: Diameter: 0.2 cm Liver: Scattered mostly echogenic nodules are present throughout the liver. These were not present the CT of 10/30/2018. Some have a targetoid appearance as on image 37 of series 1. Appearance suspicious for diffuse hepatic metastatic disease. Portal vein is patent on color Doppler imaging with normal direction of blood flow towards the liver. Other: None. IMPRESSION: 1. Numerous echogenic nodules throughout the liver, suspicious for diffuse hepatic metastatic disease. Consider further workup such as CT of the chest, abdomen, pelvis with contrast for further characterization and to assess for potential primary. PET-CT could also be an alternative imaging choice or adjunct. The patient has a history of prostate cancer but this would be an unusual metastatic pathway for prostate cancer, and a gastrointestinal primary or liver primary would be more common. The possibility of macronodular cirrhosis is not entirely discarded but seems less likely given the appearance. Electronically Signed   By: Gaylyn Rong M.D.   On: 06/30/2023 13:45   ECHOCARDIOGRAM COMPLETE  Result Date: 06/30/2023    ECHOCARDIOGRAM REPORT   Patient Name:   William Jones Date of Exam: 06/30/2023 Medical Rec #:  [id number]             Height:       68.0 in Accession #:    [id number]            Weight:       175.7 lb Date  of Birth:  [birth year]              BSA:          1.934 m Patient Age:    72 years              BP:           146/71 mmHg Patient Gender: M                     HR:           71 bpm. Exam Location:  Inpatient Procedure: 2D Echo, Cardiac Doppler and Color Doppler Indications:    R06.9 DOE; R60.0 Lower extremity edema; R53.83 Fatigue  History:        Patient has no prior history of Echocardiogram examinations.                 Signs/Symptoms:Dyspnea, Edema and Fatigue; Risk                 Factors:Hypertension, Diabetes, Former Smoker, History of                 substance abuse. and Dyslipidemia. Patient denies chest pain. He                 does have DOE, leg edema and extreme fatigue since 06/06/2023.  Sonographer:    Carlos American RVT, RDCS (AE), RDMS Referring Phys: [id number] EMILY B MULLEN  Sonographer Comments: Suboptimal apical window. Image acquisition challenging due to respiratory motion. IMPRESSIONS  1. Left ventricular ejection fraction, by estimation, is 60 to 65%. The left ventricle has normal function. The left ventricle has no regional wall motion abnormalities. There is mild concentric left ventricular hypertrophy. Left ventricular diastolic parameters are consistent with Grade I diastolic dysfunction (impaired relaxation).  2. Right ventricular systolic function is normal. The right ventricular size is normal.  3. The mitral valve is normal in structure. Trivial mitral valve regurgitation. No evidence of mitral  wall thickening visualized. No sonographic Murphy sign noted by sonographer. Common bile duct: Diameter: 0.2 cm Liver: Scattered mostly echogenic nodules are present throughout the liver. These were not present the CT of 10/30/2018. Some have a targetoid appearance as on image 37 of series 1. Appearance suspicious for diffuse hepatic metastatic disease. Portal vein is patent on color Doppler imaging with normal direction of blood flow towards the liver. Other: None. IMPRESSION: 1. Numerous echogenic nodules throughout the liver, suspicious for diffuse hepatic metastatic disease. Consider further workup such as CT of the chest, abdomen, pelvis with contrast for further characterization and to assess for potential primary. PET-CT could also be an alternative imaging choice or adjunct. The patient has a history of prostate cancer but this would be an unusual metastatic pathway for prostate cancer, and a gastrointestinal primary or liver primary would be more common. The possibility of macronodular cirrhosis is not entirely discarded but seems less likely given the appearance. Electronically Signed   By: Gaylyn Rong M.D.   On: 06/30/2023 13:45   ECHOCARDIOGRAM COMPLETE  Result Date: 06/30/2023    ECHOCARDIOGRAM REPORT   Patient Name:   William Jones Date of Exam: 06/30/2023 Medical Rec #:  [id number]             Height:       68.0 in Accession #:    [id number]            Weight:       175.7 lb Date  of Birth:  [birth year]              BSA:          1.934 m Patient Age:    72 years              BP:           146/71 mmHg Patient Gender: M                     HR:           71 bpm. Exam Location:  Inpatient Procedure: 2D Echo, Cardiac Doppler and Color Doppler Indications:    R06.9 DOE; R60.0 Lower extremity edema; R53.83 Fatigue  History:        Patient has no prior history of Echocardiogram examinations.                 Signs/Symptoms:Dyspnea, Edema and Fatigue; Risk                 Factors:Hypertension, Diabetes, Former Smoker, History of                 substance abuse. and Dyslipidemia. Patient denies chest pain. He                 does have DOE, leg edema and extreme fatigue since 06/06/2023.  Sonographer:    Carlos American RVT, RDCS (AE), RDMS Referring Phys: [id number] EMILY B MULLEN  Sonographer Comments: Suboptimal apical window. Image acquisition challenging due to respiratory motion. IMPRESSIONS  1. Left ventricular ejection fraction, by estimation, is 60 to 65%. The left ventricle has normal function. The left ventricle has no regional wall motion abnormalities. There is mild concentric left ventricular hypertrophy. Left ventricular diastolic parameters are consistent with Grade I diastolic dysfunction (impaired relaxation).  2. Right ventricular systolic function is normal. The right ventricular size is normal.  3. The mitral valve is normal in structure. Trivial mitral valve regurgitation. No evidence of mitral  wall thickening visualized. No sonographic Murphy sign noted by sonographer. Common bile duct: Diameter: 0.2 cm Liver: Scattered mostly echogenic nodules are present throughout the liver. These were not present the CT of 10/30/2018. Some have a targetoid appearance as on image 37 of series 1. Appearance suspicious for diffuse hepatic metastatic disease. Portal vein is patent on color Doppler imaging with normal direction of blood flow towards the liver. Other: None. IMPRESSION: 1. Numerous echogenic nodules throughout the liver, suspicious for diffuse hepatic metastatic disease. Consider further workup such as CT of the chest, abdomen, pelvis with contrast for further characterization and to assess for potential primary. PET-CT could also be an alternative imaging choice or adjunct. The patient has a history of prostate cancer but this would be an unusual metastatic pathway for prostate cancer, and a gastrointestinal primary or liver primary would be more common. The possibility of macronodular cirrhosis is not entirely discarded but seems less likely given the appearance. Electronically Signed   By: Gaylyn Rong M.D.   On: 06/30/2023 13:45   ECHOCARDIOGRAM COMPLETE  Result Date: 06/30/2023    ECHOCARDIOGRAM REPORT   Patient Name:   William Jones Date of Exam: 06/30/2023 Medical Rec #:  [id number]             Height:       68.0 in Accession #:    [id number]            Weight:       175.7 lb Date  of Birth:  [birth year]              BSA:          1.934 m Patient Age:    72 years              BP:           146/71 mmHg Patient Gender: M                     HR:           71 bpm. Exam Location:  Inpatient Procedure: 2D Echo, Cardiac Doppler and Color Doppler Indications:    R06.9 DOE; R60.0 Lower extremity edema; R53.83 Fatigue  History:        Patient has no prior history of Echocardiogram examinations.                 Signs/Symptoms:Dyspnea, Edema and Fatigue; Risk                 Factors:Hypertension, Diabetes, Former Smoker, History of                 substance abuse. and Dyslipidemia. Patient denies chest pain. He                 does have DOE, leg edema and extreme fatigue since 06/06/2023.  Sonographer:    Carlos American RVT, RDCS (AE), RDMS Referring Phys: [id number] EMILY B MULLEN  Sonographer Comments: Suboptimal apical window. Image acquisition challenging due to respiratory motion. IMPRESSIONS  1. Left ventricular ejection fraction, by estimation, is 60 to 65%. The left ventricle has normal function. The left ventricle has no regional wall motion abnormalities. There is mild concentric left ventricular hypertrophy. Left ventricular diastolic parameters are consistent with Grade I diastolic dysfunction (impaired relaxation).  2. Right ventricular systolic function is normal. The right ventricular size is normal.  3. The mitral valve is normal in structure. Trivial mitral valve regurgitation. No evidence of mitral  wall thickening visualized. No sonographic Murphy sign noted by sonographer. Common bile duct: Diameter: 0.2 cm Liver: Scattered mostly echogenic nodules are present throughout the liver. These were not present the CT of 10/30/2018. Some have a targetoid appearance as on image 37 of series 1. Appearance suspicious for diffuse hepatic metastatic disease. Portal vein is patent on color Doppler imaging with normal direction of blood flow towards the liver. Other: None. IMPRESSION: 1. Numerous echogenic nodules throughout the liver, suspicious for diffuse hepatic metastatic disease. Consider further workup such as CT of the chest, abdomen, pelvis with contrast for further characterization and to assess for potential primary. PET-CT could also be an alternative imaging choice or adjunct. The patient has a history of prostate cancer but this would be an unusual metastatic pathway for prostate cancer, and a gastrointestinal primary or liver primary would be more common. The possibility of macronodular cirrhosis is not entirely discarded but seems less likely given the appearance. Electronically Signed   By: Gaylyn Rong M.D.   On: 06/30/2023 13:45   ECHOCARDIOGRAM COMPLETE  Result Date: 06/30/2023    ECHOCARDIOGRAM REPORT   Patient Name:   William Jones Date of Exam: 06/30/2023 Medical Rec #:  [id number]             Height:       68.0 in Accession #:    [id number]            Weight:       175.7 lb Date  of Birth:  [birth year]              BSA:          1.934 m Patient Age:    72 years              BP:           146/71 mmHg Patient Gender: M                     HR:           71 bpm. Exam Location:  Inpatient Procedure: 2D Echo, Cardiac Doppler and Color Doppler Indications:    R06.9 DOE; R60.0 Lower extremity edema; R53.83 Fatigue  History:        Patient has no prior history of Echocardiogram examinations.                 Signs/Symptoms:Dyspnea, Edema and Fatigue; Risk                 Factors:Hypertension, Diabetes, Former Smoker, History of                 substance abuse. and Dyslipidemia. Patient denies chest pain. He                 does have DOE, leg edema and extreme fatigue since 06/06/2023.  Sonographer:    Carlos American RVT, RDCS (AE), RDMS Referring Phys: [id number] EMILY B MULLEN  Sonographer Comments: Suboptimal apical window. Image acquisition challenging due to respiratory motion. IMPRESSIONS  1. Left ventricular ejection fraction, by estimation, is 60 to 65%. The left ventricle has normal function. The left ventricle has no regional wall motion abnormalities. There is mild concentric left ventricular hypertrophy. Left ventricular diastolic parameters are consistent with Grade I diastolic dysfunction (impaired relaxation).  2. Right ventricular systolic function is normal. The right ventricular size is normal.  3. The mitral valve is normal in structure. Trivial mitral valve regurgitation. No evidence of mitral  wall thickening visualized. No sonographic Murphy sign noted by sonographer. Common bile duct: Diameter: 0.2 cm Liver: Scattered mostly echogenic nodules are present throughout the liver. These were not present the CT of 10/30/2018. Some have a targetoid appearance as on image 37 of series 1. Appearance suspicious for diffuse hepatic metastatic disease. Portal vein is patent on color Doppler imaging with normal direction of blood flow towards the liver. Other: None. IMPRESSION: 1. Numerous echogenic nodules throughout the liver, suspicious for diffuse hepatic metastatic disease. Consider further workup such as CT of the chest, abdomen, pelvis with contrast for further characterization and to assess for potential primary. PET-CT could also be an alternative imaging choice or adjunct. The patient has a history of prostate cancer but this would be an unusual metastatic pathway for prostate cancer, and a gastrointestinal primary or liver primary would be more common. The possibility of macronodular cirrhosis is not entirely discarded but seems less likely given the appearance. Electronically Signed   By: Gaylyn Rong M.D.   On: 06/30/2023 13:45   ECHOCARDIOGRAM COMPLETE  Result Date: 06/30/2023    ECHOCARDIOGRAM REPORT   Patient Name:   William Jones Date of Exam: 06/30/2023 Medical Rec #:  [id number]             Height:       68.0 in Accession #:    [id number]            Weight:       175.7 lb Date  of Birth:  [birth year]              BSA:          1.934 m Patient Age:    72 years              BP:           146/71 mmHg Patient Gender: M                     HR:           71 bpm. Exam Location:  Inpatient Procedure: 2D Echo, Cardiac Doppler and Color Doppler Indications:    R06.9 DOE; R60.0 Lower extremity edema; R53.83 Fatigue  History:        Patient has no prior history of Echocardiogram examinations.                 Signs/Symptoms:Dyspnea, Edema and Fatigue; Risk                 Factors:Hypertension, Diabetes, Former Smoker, History of                 substance abuse. and Dyslipidemia. Patient denies chest pain. He                 does have DOE, leg edema and extreme fatigue since 06/06/2023.  Sonographer:    Carlos American RVT, RDCS (AE), RDMS Referring Phys: [id number] EMILY B MULLEN  Sonographer Comments: Suboptimal apical window. Image acquisition challenging due to respiratory motion. IMPRESSIONS  1. Left ventricular ejection fraction, by estimation, is 60 to 65%. The left ventricle has normal function. The left ventricle has no regional wall motion abnormalities. There is mild concentric left ventricular hypertrophy. Left ventricular diastolic parameters are consistent with Grade I diastolic dysfunction (impaired relaxation).  2. Right ventricular systolic function is normal. The right ventricular size is normal.  3. The mitral valve is normal in structure. Trivial mitral valve regurgitation. No evidence of mitral  wall thickening visualized. No sonographic Murphy sign noted by sonographer. Common bile duct: Diameter: 0.2 cm Liver: Scattered mostly echogenic nodules are present throughout the liver. These were not present the CT of 10/30/2018. Some have a targetoid appearance as on image 37 of series 1. Appearance suspicious for diffuse hepatic metastatic disease. Portal vein is patent on color Doppler imaging with normal direction of blood flow towards the liver. Other: None. IMPRESSION: 1. Numerous echogenic nodules throughout the liver, suspicious for diffuse hepatic metastatic disease. Consider further workup such as CT of the chest, abdomen, pelvis with contrast for further characterization and to assess for potential primary. PET-CT could also be an alternative imaging choice or adjunct. The patient has a history of prostate cancer but this would be an unusual metastatic pathway for prostate cancer, and a gastrointestinal primary or liver primary would be more common. The possibility of macronodular cirrhosis is not entirely discarded but seems less likely given the appearance. Electronically Signed   By: Gaylyn Rong M.D.   On: 06/30/2023 13:45   ECHOCARDIOGRAM COMPLETE  Result Date: 06/30/2023    ECHOCARDIOGRAM REPORT   Patient Name:   William Jones Date of Exam: 06/30/2023 Medical Rec #:  [id number]             Height:       68.0 in Accession #:    [id number]            Weight:       175.7 lb Date  of Birth:  [birth year]              BSA:          1.934 m Patient Age:    72 years              BP:           146/71 mmHg Patient Gender: M                     HR:           71 bpm. Exam Location:  Inpatient Procedure: 2D Echo, Cardiac Doppler and Color Doppler Indications:    R06.9 DOE; R60.0 Lower extremity edema; R53.83 Fatigue  History:        Patient has no prior history of Echocardiogram examinations.                 Signs/Symptoms:Dyspnea, Edema and Fatigue; Risk                 Factors:Hypertension, Diabetes, Former Smoker, History of                 substance abuse. and Dyslipidemia. Patient denies chest pain. He                 does have DOE, leg edema and extreme fatigue since 06/06/2023.  Sonographer:    Carlos American RVT, RDCS (AE), RDMS Referring Phys: [id number] EMILY B MULLEN  Sonographer Comments: Suboptimal apical window. Image acquisition challenging due to respiratory motion. IMPRESSIONS  1. Left ventricular ejection fraction, by estimation, is 60 to 65%. The left ventricle has normal function. The left ventricle has no regional wall motion abnormalities. There is mild concentric left ventricular hypertrophy. Left ventricular diastolic parameters are consistent with Grade I diastolic dysfunction (impaired relaxation).  2. Right ventricular systolic function is normal. The right ventricular size is normal.  3. The mitral valve is normal in structure. Trivial mitral valve regurgitation. No evidence of mitral  macronodular cirrhosis is not entirely discarded but seems less likely given the appearance. Electronically Signed   By: Gaylyn Rong M.D.   On: 06/30/2023 13:45   ECHOCARDIOGRAM COMPLETE  Result Date: 06/30/2023    ECHOCARDIOGRAM REPORT   Patient Name:   William Jones Date of Exam: 06/30/2023 Medical Rec #:  [id number]             Height:       68.0 in Accession #:    [id number]            Weight:       175.7 lb Date of Birth:  09-01-1951              BSA:          1.934 m Patient Age:    72 years              BP:           146/71 mmHg Patient Gender: M                     HR:           71 bpm. Exam Location:  Inpatient Procedure: 2D Echo, Cardiac Doppler and Color Doppler Indications:    R06.9 DOE; R60.0 Lower extremity edema; R53.83 Fatigue  History:        Patient has no prior history of Echocardiogram examinations.                 Signs/Symptoms:Dyspnea, Edema and Fatigue; Risk                 Factors:Hypertension, Diabetes, Former Smoker, History of                 substance abuse. and Dyslipidemia. Patient denies chest pain. He                 does have DOE, leg edema and extreme fatigue since 06/06/2023.  Sonographer:    Carlos American RVT, RDCS (AE), RDMS Referring Phys: [id number] EMILY B MULLEN  Sonographer Comments: Suboptimal apical window. Image acquisition challenging due to respiratory motion. IMPRESSIONS  1. Left ventricular ejection fraction, by estimation, is 60 to 65%. The left ventricle has normal function. The left ventricle has no regional wall motion abnormalities. There is mild concentric left ventricular hypertrophy. Left ventricular diastolic parameters are consistent with Grade I diastolic dysfunction (impaired relaxation).  2. Right ventricular systolic function is normal. The right ventricular size  is normal.  3. The mitral valve is normal in structure. Trivial mitral valve regurgitation. No evidence of mitral stenosis.  4. The aortic valve is normal in structure. Aortic valve regurgitation is not visualized. Aortic valve sclerosis/calcification is present, without any evidence of aortic stenosis.  5. The inferior vena cava is normal in size with greater than 50% respiratory variability, suggesting right atrial pressure of 3 mmHg. FINDINGS  Left Ventricle: Left ventricular ejection fraction, by estimation, is 60 to 65%. The left ventricle has normal function. The left ventricle has no regional wall motion abnormalities. The left ventricular internal cavity size was normal in size. There is  mild concentric left ventricular hypertrophy. Left ventricular diastolic parameters are consistent with Grade I diastolic dysfunction (impaired relaxation). Right Ventricle: The right ventricular size is normal. No increase in right ventricular wall thickness. Right ventricular systolic function is normal. Left Atrium: Left atrial size was normal in size. Right Atrium: Right atrial size was normal in size. Pericardium: There  macronodular cirrhosis is not entirely discarded but seems less likely given the appearance. Electronically Signed   By: Gaylyn Rong M.D.   On: 06/30/2023 13:45   ECHOCARDIOGRAM COMPLETE  Result Date: 06/30/2023    ECHOCARDIOGRAM REPORT   Patient Name:   William Jones Date of Exam: 06/30/2023 Medical Rec #:  [id number]             Height:       68.0 in Accession #:    [id number]            Weight:       175.7 lb Date of Birth:  09-01-1951              BSA:          1.934 m Patient Age:    72 years              BP:           146/71 mmHg Patient Gender: M                     HR:           71 bpm. Exam Location:  Inpatient Procedure: 2D Echo, Cardiac Doppler and Color Doppler Indications:    R06.9 DOE; R60.0 Lower extremity edema; R53.83 Fatigue  History:        Patient has no prior history of Echocardiogram examinations.                 Signs/Symptoms:Dyspnea, Edema and Fatigue; Risk                 Factors:Hypertension, Diabetes, Former Smoker, History of                 substance abuse. and Dyslipidemia. Patient denies chest pain. He                 does have DOE, leg edema and extreme fatigue since 06/06/2023.  Sonographer:    Carlos American RVT, RDCS (AE), RDMS Referring Phys: [id number] EMILY B MULLEN  Sonographer Comments: Suboptimal apical window. Image acquisition challenging due to respiratory motion. IMPRESSIONS  1. Left ventricular ejection fraction, by estimation, is 60 to 65%. The left ventricle has normal function. The left ventricle has no regional wall motion abnormalities. There is mild concentric left ventricular hypertrophy. Left ventricular diastolic parameters are consistent with Grade I diastolic dysfunction (impaired relaxation).  2. Right ventricular systolic function is normal. The right ventricular size  is normal.  3. The mitral valve is normal in structure. Trivial mitral valve regurgitation. No evidence of mitral stenosis.  4. The aortic valve is normal in structure. Aortic valve regurgitation is not visualized. Aortic valve sclerosis/calcification is present, without any evidence of aortic stenosis.  5. The inferior vena cava is normal in size with greater than 50% respiratory variability, suggesting right atrial pressure of 3 mmHg. FINDINGS  Left Ventricle: Left ventricular ejection fraction, by estimation, is 60 to 65%. The left ventricle has normal function. The left ventricle has no regional wall motion abnormalities. The left ventricular internal cavity size was normal in size. There is  mild concentric left ventricular hypertrophy. Left ventricular diastolic parameters are consistent with Grade I diastolic dysfunction (impaired relaxation). Right Ventricle: The right ventricular size is normal. No increase in right ventricular wall thickness. Right ventricular systolic function is normal. Left Atrium: Left atrial size was normal in size. Right Atrium: Right atrial size was normal in size. Pericardium: There  macronodular cirrhosis is not entirely discarded but seems less likely given the appearance. Electronically Signed   By: Gaylyn Rong M.D.   On: 06/30/2023 13:45   ECHOCARDIOGRAM COMPLETE  Result Date: 06/30/2023    ECHOCARDIOGRAM REPORT   Patient Name:   William Jones Date of Exam: 06/30/2023 Medical Rec #:  [id number]             Height:       68.0 in Accession #:    [id number]            Weight:       175.7 lb Date of Birth:  09-01-1951              BSA:          1.934 m Patient Age:    72 years              BP:           146/71 mmHg Patient Gender: M                     HR:           71 bpm. Exam Location:  Inpatient Procedure: 2D Echo, Cardiac Doppler and Color Doppler Indications:    R06.9 DOE; R60.0 Lower extremity edema; R53.83 Fatigue  History:        Patient has no prior history of Echocardiogram examinations.                 Signs/Symptoms:Dyspnea, Edema and Fatigue; Risk                 Factors:Hypertension, Diabetes, Former Smoker, History of                 substance abuse. and Dyslipidemia. Patient denies chest pain. He                 does have DOE, leg edema and extreme fatigue since 06/06/2023.  Sonographer:    Carlos American RVT, RDCS (AE), RDMS Referring Phys: [id number] EMILY B MULLEN  Sonographer Comments: Suboptimal apical window. Image acquisition challenging due to respiratory motion. IMPRESSIONS  1. Left ventricular ejection fraction, by estimation, is 60 to 65%. The left ventricle has normal function. The left ventricle has no regional wall motion abnormalities. There is mild concentric left ventricular hypertrophy. Left ventricular diastolic parameters are consistent with Grade I diastolic dysfunction (impaired relaxation).  2. Right ventricular systolic function is normal. The right ventricular size  is normal.  3. The mitral valve is normal in structure. Trivial mitral valve regurgitation. No evidence of mitral stenosis.  4. The aortic valve is normal in structure. Aortic valve regurgitation is not visualized. Aortic valve sclerosis/calcification is present, without any evidence of aortic stenosis.  5. The inferior vena cava is normal in size with greater than 50% respiratory variability, suggesting right atrial pressure of 3 mmHg. FINDINGS  Left Ventricle: Left ventricular ejection fraction, by estimation, is 60 to 65%. The left ventricle has normal function. The left ventricle has no regional wall motion abnormalities. The left ventricular internal cavity size was normal in size. There is  mild concentric left ventricular hypertrophy. Left ventricular diastolic parameters are consistent with Grade I diastolic dysfunction (impaired relaxation). Right Ventricle: The right ventricular size is normal. No increase in right ventricular wall thickness. Right ventricular systolic function is normal. Left Atrium: Left atrial size was normal in size. Right Atrium: Right atrial size was normal in size. Pericardium: There  wall thickening visualized. No sonographic Murphy sign noted by sonographer. Common bile duct: Diameter: 0.2 cm Liver: Scattered mostly echogenic nodules are present throughout the liver. These were not present the CT of 10/30/2018. Some have a targetoid appearance as on image 37 of series 1. Appearance suspicious for diffuse hepatic metastatic disease. Portal vein is patent on color Doppler imaging with normal direction of blood flow towards the liver. Other: None. IMPRESSION: 1. Numerous echogenic nodules throughout the liver, suspicious for diffuse hepatic metastatic disease. Consider further workup such as CT of the chest, abdomen, pelvis with contrast for further characterization and to assess for potential primary. PET-CT could also be an alternative imaging choice or adjunct. The patient has a history of prostate cancer but this would be an unusual metastatic pathway for prostate cancer, and a gastrointestinal primary or liver primary would be more common. The possibility of macronodular cirrhosis is not entirely discarded but seems less likely given the appearance. Electronically Signed   By: Gaylyn Rong M.D.   On: 06/30/2023 13:45   ECHOCARDIOGRAM COMPLETE  Result Date: 06/30/2023    ECHOCARDIOGRAM REPORT   Patient Name:   William Jones Date of Exam: 06/30/2023 Medical Rec #:  [id number]             Height:       68.0 in Accession #:    [id number]            Weight:       175.7 lb Date  of Birth:  [birth year]              BSA:          1.934 m Patient Age:    72 years              BP:           146/71 mmHg Patient Gender: M                     HR:           71 bpm. Exam Location:  Inpatient Procedure: 2D Echo, Cardiac Doppler and Color Doppler Indications:    R06.9 DOE; R60.0 Lower extremity edema; R53.83 Fatigue  History:        Patient has no prior history of Echocardiogram examinations.                 Signs/Symptoms:Dyspnea, Edema and Fatigue; Risk                 Factors:Hypertension, Diabetes, Former Smoker, History of                 substance abuse. and Dyslipidemia. Patient denies chest pain. He                 does have DOE, leg edema and extreme fatigue since 06/06/2023.  Sonographer:    Carlos American RVT, RDCS (AE), RDMS Referring Phys: [id number] EMILY B MULLEN  Sonographer Comments: Suboptimal apical window. Image acquisition challenging due to respiratory motion. IMPRESSIONS  1. Left ventricular ejection fraction, by estimation, is 60 to 65%. The left ventricle has normal function. The left ventricle has no regional wall motion abnormalities. There is mild concentric left ventricular hypertrophy. Left ventricular diastolic parameters are consistent with Grade I diastolic dysfunction (impaired relaxation).  2. Right ventricular systolic function is normal. The right ventricular size is normal.  3. The mitral valve is normal in structure. Trivial mitral valve regurgitation. No evidence of mitral  macronodular cirrhosis is not entirely discarded but seems less likely given the appearance. Electronically Signed   By: Gaylyn Rong M.D.   On: 06/30/2023 13:45   ECHOCARDIOGRAM COMPLETE  Result Date: 06/30/2023    ECHOCARDIOGRAM REPORT   Patient Name:   William Jones Date of Exam: 06/30/2023 Medical Rec #:  [id number]             Height:       68.0 in Accession #:    [id number]            Weight:       175.7 lb Date of Birth:  09-01-1951              BSA:          1.934 m Patient Age:    72 years              BP:           146/71 mmHg Patient Gender: M                     HR:           71 bpm. Exam Location:  Inpatient Procedure: 2D Echo, Cardiac Doppler and Color Doppler Indications:    R06.9 DOE; R60.0 Lower extremity edema; R53.83 Fatigue  History:        Patient has no prior history of Echocardiogram examinations.                 Signs/Symptoms:Dyspnea, Edema and Fatigue; Risk                 Factors:Hypertension, Diabetes, Former Smoker, History of                 substance abuse. and Dyslipidemia. Patient denies chest pain. He                 does have DOE, leg edema and extreme fatigue since 06/06/2023.  Sonographer:    Carlos American RVT, RDCS (AE), RDMS Referring Phys: [id number] EMILY B MULLEN  Sonographer Comments: Suboptimal apical window. Image acquisition challenging due to respiratory motion. IMPRESSIONS  1. Left ventricular ejection fraction, by estimation, is 60 to 65%. The left ventricle has normal function. The left ventricle has no regional wall motion abnormalities. There is mild concentric left ventricular hypertrophy. Left ventricular diastolic parameters are consistent with Grade I diastolic dysfunction (impaired relaxation).  2. Right ventricular systolic function is normal. The right ventricular size  is normal.  3. The mitral valve is normal in structure. Trivial mitral valve regurgitation. No evidence of mitral stenosis.  4. The aortic valve is normal in structure. Aortic valve regurgitation is not visualized. Aortic valve sclerosis/calcification is present, without any evidence of aortic stenosis.  5. The inferior vena cava is normal in size with greater than 50% respiratory variability, suggesting right atrial pressure of 3 mmHg. FINDINGS  Left Ventricle: Left ventricular ejection fraction, by estimation, is 60 to 65%. The left ventricle has normal function. The left ventricle has no regional wall motion abnormalities. The left ventricular internal cavity size was normal in size. There is  mild concentric left ventricular hypertrophy. Left ventricular diastolic parameters are consistent with Grade I diastolic dysfunction (impaired relaxation). Right Ventricle: The right ventricular size is normal. No increase in right ventricular wall thickness. Right ventricular systolic function is normal. Left Atrium: Left atrial size was normal in size. Right Atrium: Right atrial size was normal in size. Pericardium: There  wall thickening visualized. No sonographic Murphy sign noted by sonographer. Common bile duct: Diameter: 0.2 cm Liver: Scattered mostly echogenic nodules are present throughout the liver. These were not present the CT of 10/30/2018. Some have a targetoid appearance as on image 37 of series 1. Appearance suspicious for diffuse hepatic metastatic disease. Portal vein is patent on color Doppler imaging with normal direction of blood flow towards the liver. Other: None. IMPRESSION: 1. Numerous echogenic nodules throughout the liver, suspicious for diffuse hepatic metastatic disease. Consider further workup such as CT of the chest, abdomen, pelvis with contrast for further characterization and to assess for potential primary. PET-CT could also be an alternative imaging choice or adjunct. The patient has a history of prostate cancer but this would be an unusual metastatic pathway for prostate cancer, and a gastrointestinal primary or liver primary would be more common. The possibility of macronodular cirrhosis is not entirely discarded but seems less likely given the appearance. Electronically Signed   By: Gaylyn Rong M.D.   On: 06/30/2023 13:45   ECHOCARDIOGRAM COMPLETE  Result Date: 06/30/2023    ECHOCARDIOGRAM REPORT   Patient Name:   William Jones Date of Exam: 06/30/2023 Medical Rec #:  [id number]             Height:       68.0 in Accession #:    [id number]            Weight:       175.7 lb Date  of Birth:  [birth year]              BSA:          1.934 m Patient Age:    72 years              BP:           146/71 mmHg Patient Gender: M                     HR:           71 bpm. Exam Location:  Inpatient Procedure: 2D Echo, Cardiac Doppler and Color Doppler Indications:    R06.9 DOE; R60.0 Lower extremity edema; R53.83 Fatigue  History:        Patient has no prior history of Echocardiogram examinations.                 Signs/Symptoms:Dyspnea, Edema and Fatigue; Risk                 Factors:Hypertension, Diabetes, Former Smoker, History of                 substance abuse. and Dyslipidemia. Patient denies chest pain. He                 does have DOE, leg edema and extreme fatigue since 06/06/2023.  Sonographer:    Carlos American RVT, RDCS (AE), RDMS Referring Phys: [id number] EMILY B MULLEN  Sonographer Comments: Suboptimal apical window. Image acquisition challenging due to respiratory motion. IMPRESSIONS  1. Left ventricular ejection fraction, by estimation, is 60 to 65%. The left ventricle has normal function. The left ventricle has no regional wall motion abnormalities. There is mild concentric left ventricular hypertrophy. Left ventricular diastolic parameters are consistent with Grade I diastolic dysfunction (impaired relaxation).  2. Right ventricular systolic function is normal. The right ventricular size is normal.  3. The mitral valve is normal in structure. Trivial mitral valve regurgitation. No evidence of mitral  macronodular cirrhosis is not entirely discarded but seems less likely given the appearance. Electronically Signed   By: Gaylyn Rong M.D.   On: 06/30/2023 13:45   ECHOCARDIOGRAM COMPLETE  Result Date: 06/30/2023    ECHOCARDIOGRAM REPORT   Patient Name:   William Jones Date of Exam: 06/30/2023 Medical Rec #:  [id number]             Height:       68.0 in Accession #:    [id number]            Weight:       175.7 lb Date of Birth:  09-01-1951              BSA:          1.934 m Patient Age:    72 years              BP:           146/71 mmHg Patient Gender: M                     HR:           71 bpm. Exam Location:  Inpatient Procedure: 2D Echo, Cardiac Doppler and Color Doppler Indications:    R06.9 DOE; R60.0 Lower extremity edema; R53.83 Fatigue  History:        Patient has no prior history of Echocardiogram examinations.                 Signs/Symptoms:Dyspnea, Edema and Fatigue; Risk                 Factors:Hypertension, Diabetes, Former Smoker, History of                 substance abuse. and Dyslipidemia. Patient denies chest pain. He                 does have DOE, leg edema and extreme fatigue since 06/06/2023.  Sonographer:    Carlos American RVT, RDCS (AE), RDMS Referring Phys: [id number] EMILY B MULLEN  Sonographer Comments: Suboptimal apical window. Image acquisition challenging due to respiratory motion. IMPRESSIONS  1. Left ventricular ejection fraction, by estimation, is 60 to 65%. The left ventricle has normal function. The left ventricle has no regional wall motion abnormalities. There is mild concentric left ventricular hypertrophy. Left ventricular diastolic parameters are consistent with Grade I diastolic dysfunction (impaired relaxation).  2. Right ventricular systolic function is normal. The right ventricular size  is normal.  3. The mitral valve is normal in structure. Trivial mitral valve regurgitation. No evidence of mitral stenosis.  4. The aortic valve is normal in structure. Aortic valve regurgitation is not visualized. Aortic valve sclerosis/calcification is present, without any evidence of aortic stenosis.  5. The inferior vena cava is normal in size with greater than 50% respiratory variability, suggesting right atrial pressure of 3 mmHg. FINDINGS  Left Ventricle: Left ventricular ejection fraction, by estimation, is 60 to 65%. The left ventricle has normal function. The left ventricle has no regional wall motion abnormalities. The left ventricular internal cavity size was normal in size. There is  mild concentric left ventricular hypertrophy. Left ventricular diastolic parameters are consistent with Grade I diastolic dysfunction (impaired relaxation). Right Ventricle: The right ventricular size is normal. No increase in right ventricular wall thickness. Right ventricular systolic function is normal. Left Atrium: Left atrial size was normal in size. Right Atrium: Right atrial size was normal in size. Pericardium: There

## 2023-07-13 NOTE — Progress Notes (Addendum)
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  Was discharged home with plans for outpatient chemo start, but became profoundly weak, returned to the hospital    In the ER, mild transaminitis due to tumor, mild hypokalemia, WBC normal, no fever.  UA with pyuria.    10/6: Started on CTX, admitted for weakness, Xtandi held 10/7: Still weak but stable, Oncology consulted, planned for inpatient chemo 10/8: Patient acutely tachycardic, weak --> Lactate >9 --> transferred to ICU and antibiotics broadened, CCM consulted  10/9: Patient with hemoglobin less than 7.  Has history of bleeding gastric ulcer and 2 units of blood transfusion a month ago.  He was not able to continue Protonix with suspected allergy.   Assessment & Plan:   Septic shock and lactic acidosis: Lactic acid likely due to slow clearance from liver disease.  Blood pressure has been more or less stable.  Blood cultures negative. Urine cultures with Klebsiella oxytoca, antibiotic broadened to meropenem and vancomycin. On stress dose of steroids.  Underwent CT angiogram of the chest, CT scan abdomen pelvis 10/8 that shows Multifocal pneumonia, possibly atypical organism Innumerable liver masses consistent with metastatic disease, multiple adrenal masses Pancreatic edema. Will continue supportive care.  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  Oncology following.  Unlikely ready for chemotherapy with current level of weakness, serious illness requiring ICU admission.   Hypokalemia, hypophosphatemia: Replace aggressively and recheck levels   Anemia , acute on chronic blood loss: 1 unit of PRBC today.   Reported melanotic stool overnight. Known gastric ulcer, endoscopy about a month ago.  Reported not feeling well with Protonix show currently was not on any PPI.  This is not likely severe allergic reaction. Patient likely has recurrent gastric ulcer. Will start patient on Protonix 40 mg IV twice daily, continue to monitor hemoglobin.  If drops further will need upper GI endoscopy.  Essential hypertension: Blood pressure medications on hold at risk of hypotension.  Prostate cancer: Hold Xtandi for now.      DVT prophylaxis: enoxaparin (LOVENOX) injection 40 mg Start: 07/30/2023 2200   Code Status: Full code Family Communication: Daughter at the bedside Disposition Plan: Status is: Inpatient Remains inpatient appropriate because: IV antibiotics debility, acute infection     Consultants:  Oncology Critical care  Procedures:  None  Antimicrobials:  Vancomycin meropenem 10/8---   Subjective: Patient seen and examined.  Patient tells me he feels slightly better than yesterday.  Denies any cough or shortness of breath.  Remains afebrile.  Looks very frail and debilitated.  Patient tells me that he cannot swallow whole food and only can have soup and broth.  Will write for mechanical soft diet.  Blood pressures are adequate. Protonix made him feel bad.  Without any severe allergic reaction.  Objective: Vitals:   07/13/23 0944 07/13/23 1100 07/13/23 1200 07/13/23 1300  BP: 127/70 138/87 110/73 125/66  Pulse: (!) 110     Resp: (!) 29 17 (!) 22 (!) 21  Temp: 98.3 F (36.8 C)  98.3 F (36.8 C)   TempSrc: Axillary  Oral   SpO2: 96%     Weight:      Height:        Intake/Output Summary (Last  24 hours) at 07/13/2023 1705 Last data filed at 07/13/2023 1459 Gross per 24 hour  Intake 2073.06 ml  Output 1345 ml  Net 728.06 ml   Filed Weights   07/23/2023 1145 07/12/23 1545  Weight: 68 kg 70.9 kg    Examination:  General exam: Appears calm and comfortable  Patient is frail  and debilitated.  Chronically sick looking.  Pale. Respiratory system: Clear to auscultation. Respiratory effort normal.  No added sounds. Cardiovascular system: S1 & S2 heard, tachycardic. Gastrointestinal system: Abdomen is nondistended, soft and nontender. No organomegaly or masses felt. Normal bowel sounds heard. Central nervous system: Alert and oriented. No focal neurological deficits. Extremities: Symmetric 5 x 5 power.    Data Reviewed: I have personally reviewed following labs and imaging studies  CBC: Recent Labs  Lab 07/07/23 1547 07/28/2023 1433 07/11/23 0519 07/12/23 0546 07/12/23 1118 07/13/23 0318 07/13/23 1154  WBC 6.4 6.1 6.6 7.1 5.4 6.1  --   NEUTROABS 5.6 5.5  --   --  4.8  --   --   HGB 12.1* 10.8* 10.2* 8.8* 9.2* 6.4* 8.6*  HCT 35.0* 32.0* 31.6* 26.6* 29.9* 20.0* 25.7*  MCV 88.2 91.7 94.6 93.0 99.3 94.8  --   PLT 200 170 164 171 183 116*  --    Basic Metabolic Panel: Recent Labs  Lab 07/14/2023 1433 07/11/23 0519 07/12/23 0546 07/12/23 1118 07/12/23 1557 07/13/23 0318  NA 135 138 143 146*  --  146*  K 3.0* 3.8 3.8 4.3  --  3.2*  CL 96* 99 105 110  --  111  CO2 23 25 23  13*  --  26  GLUCOSE 267* 344* 428* 302*  --  193*  BUN 26* 19 43* 50*  --  39*  CREATININE 0.72 0.64 0.56* 0.89  --  0.52*  CALCIUM 7.4* 7.3* 7.5* 7.7*  --  7.1*  PHOS  --   --   --   --  1.2*  --    GFR: Estimated Creatinine Clearance: 85.9 mL/min (A) (by C-G formula based on SCr of 0.52 mg/dL (L)). Liver Function Tests: Recent Labs  Lab 07/07/23 1547 07/26/2023 1433 07/12/23 0546 07/12/23 1118 07/13/23 0318  AST 50* 50* 38 239* 3,101*  ALT 129* 90* 68* 352* 3,140*  ALKPHOS 123 100 83 96 74  BILITOT 0.7 1.1 0.9 1.0 0.9  PROT 6.5 5.9* 4.9* 5.2* 4.5*  ALBUMIN 3.7 2.9* 2.2* 2.5* 2.1*   No results for input(s): "LIPASE", "AMYLASE" in the last 168 hours. Recent Labs  Lab 08/02/2023 1433  AMMONIA 22   Coagulation Profile: Recent Labs  Lab 07/23/2023 1433 07/12/23 1118   INR 1.1 1.5*   Cardiac Enzymes: Recent Labs  Lab 07/12/23 0546  CKTOTAL 93   BNP (last 3 results) No results for input(s): "PROBNP" in the last 8760 hours. HbA1C: No results for input(s): "HGBA1C" in the last 72 hours. CBG: Recent Labs  Lab 07/12/23 1324 07/12/23 1644 07/12/23 2132 07/13/23 0810 07/13/23 1159  GLUCAP 238* 305* 167* 161* 293*   Lipid Profile: No results for input(s): "CHOL", "HDL", "LDLCALC", "TRIG", "CHOLHDL", "LDLDIRECT" in the last 72 hours. Thyroid Function Tests: No results for input(s): "TSH", "T4TOTAL", "FREET4", "T3FREE", "THYROIDAB" in the last 72 hours.  Anemia Panel: No results for input(s): "VITAMINB12", "FOLATE", "FERRITIN", "TIBC", "IRON", "RETICCTPCT" in the last 72 hours. Sepsis Labs: Recent Labs  Lab 07/12/23 1118 07/12/23 1215 07/12/23 1557 07/12/23 2149  LATICACIDVEN >9.0* >9.0* 8.1* 3.4*    Recent Results (from the  past 240 hour(s))  Resp panel by RT-PCR (RSV, Flu A&B, Covid) Urine, Clean Catch     Status: None   Collection Time: 07/08/2023  1:30 PM   Specimen: Urine, Clean Catch; Nasal Swab  Result Value Ref Range Status   SARS Coronavirus 2 by RT PCR NEGATIVE NEGATIVE Final    Comment: (NOTE) SARS-CoV-2 target nucleic acids are NOT DETECTED.  The SARS-CoV-2 RNA is generally detectable in upper respiratory specimens during the acute phase of infection. The lowest concentration of SARS-CoV-2 viral copies this assay can detect is 138 copies/mL. A negative result does not preclude SARS-Cov-2 infection and should not be used as the sole basis for treatment or other patient management decisions. A negative result may occur with  improper specimen collection/handling, submission of specimen other than nasopharyngeal swab, presence of viral mutation(s) within the areas targeted by this assay, and inadequate number of viral copies(<138 copies/mL). A negative result must be combined with clinical observations, patient history, and  epidemiological information. The expected result is Negative.  Fact Sheet for Patients:  BloggerCourse.com  Fact Sheet for Healthcare Providers:  SeriousBroker.it  This test is no t yet approved or cleared by the [place] FDA and  has been authorized for detection and/or diagnosis of SARS-CoV-2 by FDA under an Emergency Use Authorization (EUA). This EUA will remain  in effect (meaning this test can be used) for the duration of the COVID-19 declaration under Section 564(b)(1) of the Act, 21 U.S.C.section 360bbb-3(b)(1), unless the authorization is terminated  or revoked sooner.       Influenza A by PCR NEGATIVE NEGATIVE Final   Influenza B by PCR NEGATIVE NEGATIVE Final    Comment: (NOTE) The Xpert Xpress SARS-CoV-2/FLU/RSV plus assay is intended as an aid in the diagnosis of influenza from Nasopharyngeal swab specimens and should not be used as a sole basis for treatment. Nasal washings and aspirates are unacceptable for Xpert Xpress SARS-CoV-2/FLU/RSV testing.  Fact Sheet for Patients: BloggerCourse.com  Fact Sheet for Healthcare Providers: SeriousBroker.it  This test is not yet approved or cleared by the [place] FDA and has been authorized for detection and/or diagnosis of SARS-CoV-2 by FDA under an Emergency Use Authorization (EUA). This EUA will remain in effect (meaning this test can be used) for the duration of the COVID-19 declaration under Section 564(b)(1) of the Act, 21 U.S.C. section 360bbb-3(b)(1), unless the authorization is terminated or revoked.     Resp Syncytial Virus by PCR NEGATIVE NEGATIVE Final    Comment: (NOTE) Fact Sheet for Patients: BloggerCourse.com  Fact Sheet for Healthcare Providers: SeriousBroker.it  This test is not yet approved or cleared by the [place] FDA and has been  authorized for detection and/or diagnosis of SARS-CoV-2 by FDA under an Emergency Use Authorization (EUA). This EUA will remain in effect (meaning this test can be used) for the duration of the COVID-19 declaration under Section 564(b)(1) of the Act, 21 U.S.C. section 360bbb-3(b)(1), unless the authorization is terminated or revoked.  Performed at [facility], [address], [place], [place] [address]   Urine Culture (for pregnant, neutropenic or urologic patients or patients with an indwelling urinary catheter)     Status: Abnormal   Collection Time: 07/29/2023  1:30 PM   Specimen: Urine, Clean Catch  Result Value Ref Range Status   Specimen Description   Final    URINE, CLEAN CATCH Performed at [facility] [place], [address] [place], [place] [address]    Special Requests  Final    NONE Performed at [facility], [address] [place], [place] [address]    Culture >=100,000 COLONIES/mL KLEBSIELLA OXYTOCA (A)  Final   Report Status 07/13/2023 FINAL  Final   Organism ID, Bacteria KLEBSIELLA OXYTOCA (A)  Final      Susceptibility   Klebsiella oxytoca - MIC*    AMPICILLIN >=32 RESISTANT Resistant     CEFEPIME <=0.12 SENSITIVE Sensitive     CEFTRIAXONE <=0.25 SENSITIVE Sensitive     CIPROFLOXACIN <=0.25 SENSITIVE Sensitive     GENTAMICIN <=1 SENSITIVE Sensitive     IMIPENEM <=0.25 SENSITIVE Sensitive     NITROFURANTOIN 32 SENSITIVE Sensitive     TRIMETH/SULFA <=20 SENSITIVE Sensitive     AMPICILLIN/SULBACTAM 4 SENSITIVE Sensitive     PIP/TAZO <=4 SENSITIVE Sensitive ug/mL    * >=100,000 COLONIES/mL KLEBSIELLA OXYTOCA  Culture, blood (x 2)     Status: None (Preliminary result)   Collection Time: 07/12/23 11:18 AM   Specimen: BLOOD RIGHT HAND  Result Value Ref Range Status   Specimen Description   Final    BLOOD RIGHT HAND Performed at [facility], [address] [place], [place] [address]    Special Requests    Final    BOTTLES DRAWN AEROBIC AND ANAEROBIC Blood Culture adequate volume Performed at [facility], [address] [place], [place] [address]    Culture   Final    NO GROWTH < 24 HOURS Performed at [facility], [address] [place], [place] [address]    Report Status PENDING  Incomplete  Culture, blood (x 2)     Status: None (Preliminary result)   Collection Time: 07/12/23 11:19 AM   Specimen: BLOOD RIGHT ARM  Result Value Ref Range Status   Specimen Description   Final    BLOOD RIGHT ARM Performed at [facility], [address] [place], [place] [address]    Special Requests   Final    BOTTLES DRAWN AEROBIC AND ANAEROBIC Blood Culture adequate volume Performed at [facility], [address] [place], [place] [address]    Culture   Final    NO GROWTH < 24 HOURS Performed at [facility], [address], [place], [place] [address]    Report Status PENDING  Incomplete  MRSA Next Gen by PCR, Nasal     Status: None   Collection Time: 07/12/23  3:46 PM   Specimen: Nasal Mucosa; Nasal Swab  Result Value Ref Range Status   MRSA by PCR Next Gen NOT DETECTED NOT DETECTED Final    Comment: (NOTE) The GeneXpert MRSA Assay (FDA approved for NASAL specimens only), is one component of a comprehensive MRSA colonization surveillance program. It is not intended to diagnose MRSA infection nor to guide or monitor treatment for MRSA infections. Test performance is not FDA approved in patients less than 27 years old. Performed at [facility], [address] [place], [place] [address]          Radiology Studies: CT Angio Chest Pulmonary Embolism (PE) W or WO Contrast  Result Date: 07/12/2023 CLINICAL DATA:  Abdomen pain fatigue EXAM: CT ANGIOGRAPHY CHEST CT ABDOMEN AND PELVIS WITH CONTRAST TECHNIQUE: Multidetector CT imaging of the chest was performed using the standard protocol during bolus  administration of intravenous contrast. Multiplanar CT image reconstructions and MIPs were obtained to evaluate the vascular anatomy. Multidetector CT imaging of the abdomen and pelvis was performed  using the standard protocol during bolus administration of intravenous contrast. RADIATION DOSE REDUCTION: This exam was performed according to the departmental dose-optimization program which includes automated exposure control, adjustment of the mA and/or kV according to patient size and/or use of iterative reconstruction technique. CONTRAST:  OMNIPAQUE IOHEXOL 350 MG/ML SOLN COMPARISON:  Chest x-ray 07/07/2023, CT 06/30/2023 FINDINGS: CTA CHEST FINDINGS Cardiovascular: Satisfactory opacification of the pulmonary arteries to the segmental level. No evidence of pulmonary embolism. Mild aortic atherosclerosis. No aneurysm. No dissection. Normal cardiac size. No pericardial effusion Mediastinum/Nodes: Midline trachea. No thyroid mass. No suspicious lymph nodes. Esophagus within normal limits. Lungs/Pleura: Mild emphysema. No pleural effusion or pneumothorax. Interim development of multifocal bilateral heterogeneous ground-glass densities. Musculoskeletal: Sternum appears intact. Mild sclerosis in the T10 vertebral body on the left side, corresponding to recent bone scan finding and suspect for skeletal osseous metastatic disease Review of the MIP images confirms the above findings. CT ABDOMEN and PELVIS FINDINGS Hepatobiliary: Innumerable hypodense liver masses consistent with metastatic disease. No calcified gallstone. No biliary dilatation Pancreas: No ductal dilatation. New small volume fluid surrounding the distal body and tail of pancreas. Spleen: Normal in size without focal abnormality. Adrenals/Urinary Tract: Bilateral adrenal masses consistent with metastatic disease. Kidneys show no hydronephrosis. 7 mm soft tissue nodule in the left perirenal space on series 2, image 32, appears separate from kidney and  difficult to exclude small metastatic focus. Punctate nonobstructing right kidney stone. Urinary bladder is unremarkable Stomach/Bowel: The stomach is nonenlarged. Mild eccentric wall thickening at the posterior fundus. No small bowel distension. Fluid in the colon. Negative appendix Vascular/Lymphatic: Moderate aortic atherosclerosis. No aneurysm. No suspicious lymph nodes. Reproductive: Negative prostate other than fiducial markers Other: No free air Musculoskeletal: No acute osseous abnormality Review of the MIP images confirms the above findings. IMPRESSION: 1. Negative for acute pulmonary embolus. 2. Interim development of multifocal bilateral heterogeneous ground-glass densities, suspect for multifocal pneumonia, possible atypical/viral organism. 3. Innumerable liver masses consistent with metastatic disease or multifocal liver malignancy. Bilateral adrenal masses consistent with metastatic disease. 4. Mild eccentric wall thickening at the posterior fundus of stomach, could be correlated with endoscopy as indicated 5. New small volume fluid surrounding the distal body and tail of pancreas, findings could be secondary to pancreatitis, recommend correlation with appropriate laboratory studies. 6. 7 mm soft tissue nodule in the left perirenal space appears separate from the kidney and difficult to exclude small metastatic focus. 7. Minimal sclerosis at the T10 vertebral body suspicious for metastatic bone lesion Aortic Atherosclerosis (ICD10-I70.0) and Emphysema (ICD10-J43.9). Electronically Signed   By: Jasmine Pang M.D.   On: 07/12/2023 18:55   CT ABDOMEN PELVIS W CONTRAST  Result Date: 07/12/2023 CLINICAL DATA:  Abdomen pain fatigue EXAM: CT ANGIOGRAPHY CHEST CT ABDOMEN AND PELVIS WITH CONTRAST TECHNIQUE: Multidetector CT imaging of the chest was performed using the standard protocol during bolus administration of intravenous contrast. Multiplanar CT image reconstructions and MIPs were obtained to  evaluate the vascular anatomy. Multidetector CT imaging of the abdomen and pelvis was performed using the standard protocol during bolus administration of intravenous contrast. RADIATION DOSE REDUCTION: This exam was performed according to the departmental dose-optimization program which includes automated exposure control, adjustment of the mA and/or kV according to patient size and/or use of iterative reconstruction technique. CONTRAST:  OMNIPAQUE IOHEXOL 350 MG/ML SOLN COMPARISON:  Chest x-ray 07/09/2023, CT 06/30/2023 FINDINGS: CTA CHEST FINDINGS Cardiovascular: Satisfactory opacification of the pulmonary arteries to the segmental level. No evidence of pulmonary embolism. Mild  aortic atherosclerosis. No aneurysm. No dissection. Normal cardiac size. No pericardial effusion Mediastinum/Nodes: Midline trachea. No thyroid mass. No suspicious lymph nodes. Esophagus within normal limits. Lungs/Pleura: Mild emphysema. No pleural effusion or pneumothorax. Interim development of multifocal bilateral heterogeneous ground-glass densities. Musculoskeletal: Sternum appears intact. Mild sclerosis in the T10 vertebral body on the left side, corresponding to recent bone scan finding and suspect for skeletal osseous metastatic disease Review of the MIP images confirms the above findings. CT ABDOMEN and PELVIS FINDINGS Hepatobiliary: Innumerable hypodense liver masses consistent with metastatic disease. No calcified gallstone. No biliary dilatation Pancreas: No ductal dilatation. New small volume fluid surrounding the distal body and tail of pancreas. Spleen: Normal in size without focal abnormality. Adrenals/Urinary Tract: Bilateral adrenal masses consistent with metastatic disease. Kidneys show no hydronephrosis. 7 mm soft tissue nodule in the left perirenal space on series 2, image 32, appears separate from kidney and difficult to exclude small metastatic focus. Punctate nonobstructing right kidney stone. Urinary bladder  is unremarkable Stomach/Bowel: The stomach is nonenlarged. Mild eccentric wall thickening at the posterior fundus. No small bowel distension. Fluid in the colon. Negative appendix Vascular/Lymphatic: Moderate aortic atherosclerosis. No aneurysm. No suspicious lymph nodes. Reproductive: Negative prostate other than fiducial markers Other: No free air Musculoskeletal: No acute osseous abnormality Review of the MIP images confirms the above findings. IMPRESSION: 1. Negative for acute pulmonary embolus. 2. Interim development of multifocal bilateral heterogeneous ground-glass densities, suspect for multifocal pneumonia, possible atypical/viral organism. 3. Innumerable liver masses consistent with metastatic disease or multifocal liver malignancy. Bilateral adrenal masses consistent with metastatic disease. 4. Mild eccentric wall thickening at the posterior fundus of stomach, could be correlated with endoscopy as indicated 5. New small volume fluid surrounding the distal body and tail of pancreas, findings could be secondary to pancreatitis, recommend correlation with appropriate laboratory studies. 6. 7 mm soft tissue nodule in the left perirenal space appears separate from the kidney and difficult to exclude small metastatic focus. 7. Minimal sclerosis at the T10 vertebral body suspicious for metastatic bone lesion Aortic Atherosclerosis (ICD10-I70.0) and Emphysema (ICD10-J43.9). Electronically Signed   By: Jasmine Pang M.D.   On: 07/12/2023 18:55        Scheduled Meds:  sodium chloride   Intravenous Once   CARBOplatin  470 mg Intravenous Once   Chlorhexidine Gluconate Cloth  6 each Topical Daily   docusate sodium  100 mg Oral BID   dorzolamide  1 drop Right Eye BID   enoxaparin (LOVENOX) injection  40 mg Subcutaneous QHS   etoposide  80 mg/m2 (Treatment Plan Recorded) Intravenous Once   feeding supplement (GLUCERNA SHAKE)  237 mL Oral BID BM   hydrocortisone sod succinate (SOLU-CORTEF) inj  100 mg  Intravenous Q8H   insulin aspart  0-15 Units Subcutaneous TID WC   insulin aspart  0-5 Units Subcutaneous QHS   insulin glargine-yfgn  12 Units Subcutaneous Daily   palonosetron  0.25 mg Intravenous Once   pantoprazole (PROTONIX) IV  40 mg Intravenous Q12H   sodium chloride flush  3 mL Intravenous Q12H   timolol  1 drop Right Eye BID   Continuous Infusions:  dexamethasone (DECADRON) IVPB ([facility])     fosaprepitant (EMEND) 150 mg in sodium chloride 0.9 % 145 mL IVPB     meropenem (MERREM) IV Stopped (07/13/23 1300)   vancomycin Stopped (07/13/23 0335)     LOS: 1 day    Time spent: 50 minutes    Dorcas Carrow, MD [facility]

## 2023-07-13 NOTE — Progress Notes (Signed)
Physical Therapy Treatment Patient Details Name: ZAMARRION SEGARRA MRN: [id number] DOB: [birth year] Today's Date: 07/13/2023   History of Present Illness Mr. 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   Was discharged home with plans for outpatient chemo start, but became profoundly weak, returned to the hospital 07/12/23.: 10/8 Patient acutely tachycardic, weak --> Lactate >9 --> transferred to ICU    PT Comments  The patient  is very weak since worsening medical issues. Patient required  +2 assist to stand and step to recliner. Patient's HR up to 140 briefly and noted with SOB, RR 30's. SPO2 remained > 90 on RA. Patient will benefit from continued inpatient follow up therapy, <3 hours/day     If plan is discharge home, recommend the following: Two people to help with walking and/or transfers;A lot of help with bathing/dressing/bathroom;Assistance with cooking/housework   Can travel by private vehicle     No  Equipment Recommendations  None recommended by PT    Recommendations for Other Services       Precautions / Restrictions Precautions Precautions: Fall Precaution Comments: monitor VS, HR high Restrictions Weight Bearing Restrictions: No     Mobility  Bed Mobility   Bed Mobility: Supine to Sit     Supine to sit: Mod assist, HOB elevated     General bed mobility comments: increased time and required mod assist to sit upright,  mod to scoot to bed edge    Transfers Overall transfer level: Needs assistance Equipment used: Rolling walker (2 wheels) Transfers: Sit to/from Stand, Bed to chair/wheelchair/BSC Sit to Stand: Mod assist, +2 safety/equipment, +2 physical assistance           General transfer comment: mod support to stand from bed x 3, legs very weak. Step to rcliner with mod of 2, very small steps, knees  flexed     Ambulation/Gait                   Stairs             Wheelchair Mobility     Tilt Bed    Modified Rankin (Stroke Patients Only)       Balance Overall balance assessment: Needs assistance Sitting-balance support: Feet supported Sitting balance-Leahy Scale: Poor Sitting balance - Comments: tending to lean posteriorly Postural control: Posterior lean Standing balance support: During functional activity, Bilateral upper extremity supported, Reliant on assistive device for balance Standing balance-Leahy Scale: Poor Standing balance comment: reliant on UE and  external support                            Cognition Arousal: Alert Behavior During Therapy: WFL for tasks assessed/performed Overall Cognitive Status: Impaired/Different from baseline Area of Impairment: Orientation                 Orientation Level: Time, Situation             General Comments: some stuttering wiht speech. overall appropriate. appeared to have some decreased insight to deficits, occationally difficult to redirect        Exercises      General Comments        Pertinent Vitals/Pain Pain Assessment Pain Assessment: No/denies pain    Home Living  Prior Function            PT Goals (current goals can now be found in the care plan section) Progress towards PT goals: Progressing toward goals    Frequency    Min 1X/week      PT Plan      Co-evaluation              AM-PAC PT "6 Clicks" Mobility   Outcome Measure  Help needed turning from your back to your side while in a flat bed without using bedrails?: A Lot Help needed moving from lying on your back to sitting on the side of a flat bed without using bedrails?: A Lot Help needed moving to and from a bed to a chair (including a wheelchair)?: A Lot Help needed standing up from a chair using your arms (e.g., wheelchair or bedside chair)?: A Lot Help  needed to walk in hospital room?: Total Help needed climbing 3-5 steps with a railing? : Total 6 Click Score: 10    End of Session Equipment Utilized During Treatment: Gait belt Activity Tolerance: Patient limited by fatigue Patient left: in chair;with call bell/phone within reach;with chair alarm set;with family/visitor present Nurse Communication: Mobility status PT Visit Diagnosis: Unsteadiness on feet (R26.81);Other abnormalities of gait and mobility (R26.89);Muscle weakness (generalized) (M62.81);Difficulty in walking, not elsewhere classified (R26.2)     Time: 6578-4696 PT Time Calculation (min) (ACUTE ONLY): 30 min  Charges:    $Therapeutic Activity: 23-37 mins PT General Charges $$ ACUTE PT VISIT: 1 Visit                     Blanchard Kelch PT Acute Rehabilitation Services Office [phone] Weekend [phone]    [name] 07/13/2023, 1:58 PM

## 2023-07-14 ENCOUNTER — Inpatient Hospital Stay: Payer: 59

## 2023-07-14 DIAGNOSIS — K7200 Acute and subacute hepatic failure without coma: Secondary | ICD-10-CM | POA: Diagnosis not present

## 2023-07-14 DIAGNOSIS — E872 Acidosis, unspecified: Secondary | ICD-10-CM | POA: Diagnosis not present

## 2023-07-14 DIAGNOSIS — R579 Shock, unspecified: Secondary | ICD-10-CM | POA: Diagnosis not present

## 2023-07-14 DIAGNOSIS — E878 Other disorders of electrolyte and fluid balance, not elsewhere classified: Secondary | ICD-10-CM | POA: Diagnosis present

## 2023-07-14 LAB — COMPREHENSIVE METABOLIC PANEL
ALT: 2337 U/L — ABNORMAL HIGH (ref 0–44)
AST: 1065 U/L — ABNORMAL HIGH (ref 15–41)
Albumin: 2 g/dL — ABNORMAL LOW (ref 3.5–5.0)
Alkaline Phosphatase: 81 U/L (ref 38–126)
Anion gap: 9 (ref 5–15)
BUN: 22 mg/dL (ref 8–23)
CO2: 25 mmol/L (ref 22–32)
Calcium: 6.7 mg/dL — ABNORMAL LOW (ref 8.9–10.3)
Chloride: 111 mmol/L (ref 98–111)
Creatinine, Ser: 0.4 mg/dL — ABNORMAL LOW (ref 0.61–1.24)
GFR, Estimated: >60 mL/min (ref >60)
Glucose, Bld: 134 mg/dL — ABNORMAL HIGH (ref 70–99)
Potassium: 3 mmol/L — ABNORMAL LOW (ref 3.5–5.1)
Sodium: 145 mmol/L (ref 135–145)
Total Bilirubin: 0.9 mg/dL (ref 0.3–1.2)
Total Protein: 4.3 g/dL — ABNORMAL LOW (ref 6.5–8.1)

## 2023-07-14 LAB — CBC WITH DIFFERENTIAL/PLATELET
Abs Immature Granulocytes: 0.09 10*3/uL — ABNORMAL HIGH (ref 0.00–0.07)
Basophils Absolute: 0 10*3/uL (ref 0.0–0.1)
Basophils Relative: 0 %
Eosinophils Absolute: 0 10*3/uL (ref 0.0–0.5)
Eosinophils Relative: 0 %
HCT: 23.4 % — ABNORMAL LOW (ref 39.0–52.0)
Hemoglobin: 7.8 g/dL — ABNORMAL LOW (ref 13.0–17.0)
Immature Granulocytes: 1 %
Lymphocytes Relative: 5 %
Lymphs Abs: 0.4 10*3/uL — ABNORMAL LOW (ref 0.7–4.0)
MCH: 30.8 pg (ref 26.0–34.0)
MCHC: 33.3 g/dL (ref 30.0–36.0)
MCV: 92.5 fL (ref 80.0–100.0)
Monocytes Absolute: 0.3 10*3/uL (ref 0.1–1.0)
Monocytes Relative: 5 %
Neutro Abs: 6.3 10*3/uL (ref 1.7–7.7)
Neutrophils Relative %: 89 %
Platelets: 72 10*3/uL — ABNORMAL LOW (ref 150–400)
RBC: 2.53 MIL/uL — ABNORMAL LOW (ref 4.22–5.81)
RDW: 16.1 % — ABNORMAL HIGH (ref 11.5–15.5)
WBC: 7.1 10*3/uL (ref 4.0–10.5)
nRBC: 1 % — ABNORMAL HIGH (ref 0.0–0.2)

## 2023-07-14 LAB — GLUCOSE, CAPILLARY
Glucose-Capillary: 148 mg/dL — ABNORMAL HIGH (ref 70–99)
Glucose-Capillary: 247 mg/dL — ABNORMAL HIGH (ref 70–99)
Glucose-Capillary: 206 mg/dL — ABNORMAL HIGH (ref 70–99)
Glucose-Capillary: 98 mg/dL (ref 70–99)

## 2023-07-14 LAB — MAGNESIUM: Magnesium: 1.8 mg/dL (ref 1.7–2.4)

## 2023-07-14 LAB — PHOSPHORUS: Phosphorus: 1.5 mg/dL — ABNORMAL LOW (ref 2.5–4.6)

## 2023-07-14 MED ORDER — ORAL CARE MOUTH RINSE
15.0000 mL | OROMUCOSAL | Status: DC
  Administered 2023-07-14 – 2023-07-26 (×40): 15 mL via OROMUCOSAL

## 2023-07-14 MED ORDER — SODIUM CHLORIDE 0.9 % IV SOLN
3.0000 g | Freq: Four times a day (QID) | INTRAVENOUS | Status: DC
  Administered 2023-07-14 – 2023-07-17 (×12): 3 g via INTRAVENOUS
  Filled 2023-07-14 (×12): qty 8

## 2023-07-14 MED ORDER — HYDROCORTISONE SOD SUC (PF) 100 MG IJ SOLR
50.0000 mg | Freq: Two times a day (BID) | INTRAMUSCULAR | Status: DC
  Administered 2023-07-14 – 2023-07-15 (×2): 50 mg via INTRAVENOUS
  Filled 2023-07-14 (×2): qty 2

## 2023-07-14 MED ORDER — ORAL CARE MOUTH RINSE: 15.0000 mL | OROMUCOSAL | Status: DC | PRN

## 2023-07-14 MED ORDER — POTASSIUM PHOSPHATES 15 MMOLE/5ML IV SOLN
30.0000 mmol | Freq: Once | INTRAVENOUS | Status: AC
  Administered 2023-07-14: 30 mmol via INTRAVENOUS
  Filled 2023-07-14: qty 10

## 2023-07-14 NOTE — Progress Notes (Signed)
Physical Therapy Treatment Patient Details Name: William Jones MRN: [id number] DOB: [birth year] Today's Date: 07/14/2023   History of Present Illness Mr. 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   Was discharged home with plans for outpatient chemo start, but became profoundly weak, returned to the hospital 07/12/23.: 10/8 Patient acutely tachycardic, weak --> Lactate >9 --> transferred to ICU    PT Comments  Mod assist for supine to sit, Mod assist of 2 for sit to stand and to take a few pivotal steps from bed to bedside commode, then to recliner with RW. HR up to 140 max with activity, SpO2 96% on 4L with activity. Pt fatigues quickly and is quite weak. Patient will benefit from continued inpatient follow up therapy, <3 hours/day.      If plan is discharge home, recommend the following: Two people to help with walking and/or transfers;A lot of help with bathing/dressing/bathroom;Assistance with cooking/housework;Assist for transportation;Help with stairs or ramp for entrance   Can travel by private vehicle     No  Equipment Recommendations  None recommended by PT    Recommendations for Other Services       Precautions / Restrictions Precautions Precautions: Fall Precaution Comments: monitor VS, HR high Restrictions Weight Bearing Restrictions: No     Mobility  Bed Mobility Overal bed mobility: Needs Assistance Bed Mobility: Supine to Sit     Supine to sit: Mod assist, HOB elevated, Used rails     General bed mobility comments: assist to raise trunk and pivot hips to EOB    Transfers Overall transfer level: Needs assistance Equipment used: Rolling walker (2 wheels) Transfers: Sit to/from Stand, Bed to chair/wheelchair/BSC Sit to Stand: Mod assist, +2 safety/equipment, +2 physical assistance           General transfer  comment: mod support to stand from bed x 3, legs very weak. Step to bedside commode, then step to recliner with mod of 2, very small steps, knees  flexed. HR 140 max with activity. SpO2 96% on 4L O2 with activity.    Ambulation/Gait                   Stairs             Wheelchair Mobility     Tilt Bed    Modified Rankin (Stroke Patients Only)       Balance Overall balance assessment: Needs assistance Sitting-balance support: Feet supported Sitting balance-Leahy Scale: Fair Sitting balance - Comments: tending to lean posteriorly Postural control: Posterior lean Standing balance support: During functional activity, Bilateral upper extremity supported, Reliant on assistive device for balance Standing balance-Leahy Scale: Poor Standing balance comment: reliant on BUE support                            Cognition Arousal: Alert Behavior During Therapy: WFL for tasks assessed/performed Overall Cognitive Status: Within Functional Limits for tasks assessed                                          Exercises Other Exercises Other Exercises: seated trunk rotation AROM x 5 bilaterally    General Comments        Pertinent Vitals/Pain Pain Assessment Pain Assessment: No/denies pain  Home Living                          Prior Function            PT Goals (current goals can now be found in the care plan section) Acute Rehab PT Goals Patient Stated Goal: to return home and improve strength PT Goal Formulation: With patient Time For Goal Achievement: 07/25/23 Potential to Achieve Goals: Good Progress towards PT goals: Progressing toward goals    Frequency    Min 1X/week      PT Plan      Co-evaluation              AM-PAC PT "6 Clicks" Mobility   Outcome Measure  Help needed turning from your back to your side while in a flat bed without using bedrails?: A Lot Help needed moving from lying on your  back to sitting on the side of a flat bed without using bedrails?: A Lot Help needed moving to and from a bed to a chair (including a wheelchair)?: A Lot Help needed standing up from a chair using your arms (e.g., wheelchair or bedside chair)?: A Lot Help needed to walk in hospital room?: Total Help needed climbing 3-5 steps with a railing? : Total 6 Click Score: 10    End of Session Equipment Utilized During Treatment: Gait belt;Oxygen Activity Tolerance: Patient limited by fatigue Patient left: in chair;with call bell/phone within reach;with chair alarm set Nurse Communication: Mobility status PT Visit Diagnosis: Unsteadiness on feet (R26.81);Other abnormalities of gait and mobility (R26.89);Muscle weakness (generalized) (M62.81);Difficulty in walking, not elsewhere classified (R26.2)     Time: 0865-7846 PT Time Calculation (min) (ACUTE ONLY): 28 min  Charges:    $Therapeutic Activity: 23-37 mins PT General Charges $$ ACUTE PT VISIT: 1 Visit                     Tamala Ser PT 07/14/2023  Acute Rehabilitation Services  Office [phone]

## 2023-07-14 NOTE — Progress Notes (Signed)
Weight:       175.7 lb Date of Birth:  1951-09-27              BSA:          1.934 m Patient Age:    72 years              BP:           146/71 mmHg Patient Gender: M                     HR:           71 bpm. Exam Location:  Inpatient Procedure: 2D Echo, Cardiac Doppler and Color Doppler Indications:    R06.9 DOE; R60.0 Lower extremity edema; R53.83 Fatigue  History:        Patient has no prior history of Echocardiogram examinations.                 Signs/Symptoms:Dyspnea, Edema and Fatigue; Risk                 Factors:Hypertension, Diabetes, Former Smoker, History of                 substance abuse. and Dyslipidemia. Patient denies chest pain. He                 does have DOE, leg edema and extreme fatigue since 06/06/2023.  Sonographer:    Carlos American RVT, RDCS (AE), RDMS Referring Phys: [id number] EMILY B MULLEN  Sonographer Comments: Suboptimal apical window. Image acquisition challenging due to respiratory motion. IMPRESSIONS  1. Left ventricular ejection fraction, by estimation, is 60 to 65%. The left ventricle has normal function. The left ventricle has no regional wall motion abnormalities. There is mild concentric left ventricular hypertrophy. Left ventricular diastolic parameters are consistent with Grade I diastolic dysfunction (impaired relaxation).  2. Right ventricular systolic function is normal. The right ventricular size is normal.  3. The mitral valve is normal in structure. Trivial mitral valve regurgitation. No evidence of mitral stenosis.  4. The aortic valve is normal in structure. Aortic valve regurgitation is not visualized. Aortic valve sclerosis/calcification is present, without any evidence of aortic stenosis.  5. The inferior vena cava is normal in size with greater than 50% respiratory variability, suggesting right atrial pressure of 3 mmHg. FINDINGS  Left Ventricle: Left ventricular ejection fraction, by estimation, is 60 to  65%. The left ventricle has normal function. The left ventricle has no regional wall motion abnormalities. The left ventricular internal cavity size was normal in size. There is  mild concentric left ventricular hypertrophy. Left ventricular diastolic parameters are consistent with Grade I diastolic dysfunction (impaired relaxation). Right Ventricle: The right ventricular size is normal. No increase in right ventricular wall thickness. Right ventricular systolic function is normal. Left Atrium: Left atrial size was normal in size. Right Atrium: Right atrial size was normal in size. Pericardium: There is no evidence of pericardial effusion. Mitral Valve: The mitral valve is normal in structure. Trivial mitral valve regurgitation. No evidence of mitral valve stenosis. Tricuspid Valve: The tricuspid valve is normal in structure. Tricuspid valve regurgitation is trivial. No evidence of tricuspid stenosis. Aortic Valve: The aortic valve is normal in structure. Aortic valve regurgitation is not visualized. Aortic valve sclerosis/calcification is present, without any evidence of aortic stenosis. Aortic valve mean gradient measures 5.0 mmHg. Aortic valve peak  gradient measures 11.3 mmHg. Aortic valve area, by VTI measures 3.00 cm. Pulmonic Valve: The  Weight:       175.7 lb Date of Birth:  1951-09-27              BSA:          1.934 m Patient Age:    72 years              BP:           146/71 mmHg Patient Gender: M                     HR:           71 bpm. Exam Location:  Inpatient Procedure: 2D Echo, Cardiac Doppler and Color Doppler Indications:    R06.9 DOE; R60.0 Lower extremity edema; R53.83 Fatigue  History:        Patient has no prior history of Echocardiogram examinations.                 Signs/Symptoms:Dyspnea, Edema and Fatigue; Risk                 Factors:Hypertension, Diabetes, Former Smoker, History of                 substance abuse. and Dyslipidemia. Patient denies chest pain. He                 does have DOE, leg edema and extreme fatigue since 06/06/2023.  Sonographer:    Carlos American RVT, RDCS (AE), RDMS Referring Phys: [id number] EMILY B MULLEN  Sonographer Comments: Suboptimal apical window. Image acquisition challenging due to respiratory motion. IMPRESSIONS  1. Left ventricular ejection fraction, by estimation, is 60 to 65%. The left ventricle has normal function. The left ventricle has no regional wall motion abnormalities. There is mild concentric left ventricular hypertrophy. Left ventricular diastolic parameters are consistent with Grade I diastolic dysfunction (impaired relaxation).  2. Right ventricular systolic function is normal. The right ventricular size is normal.  3. The mitral valve is normal in structure. Trivial mitral valve regurgitation. No evidence of mitral stenosis.  4. The aortic valve is normal in structure. Aortic valve regurgitation is not visualized. Aortic valve sclerosis/calcification is present, without any evidence of aortic stenosis.  5. The inferior vena cava is normal in size with greater than 50% respiratory variability, suggesting right atrial pressure of 3 mmHg. FINDINGS  Left Ventricle: Left ventricular ejection fraction, by estimation, is 60 to  65%. The left ventricle has normal function. The left ventricle has no regional wall motion abnormalities. The left ventricular internal cavity size was normal in size. There is  mild concentric left ventricular hypertrophy. Left ventricular diastolic parameters are consistent with Grade I diastolic dysfunction (impaired relaxation). Right Ventricle: The right ventricular size is normal. No increase in right ventricular wall thickness. Right ventricular systolic function is normal. Left Atrium: Left atrial size was normal in size. Right Atrium: Right atrial size was normal in size. Pericardium: There is no evidence of pericardial effusion. Mitral Valve: The mitral valve is normal in structure. Trivial mitral valve regurgitation. No evidence of mitral valve stenosis. Tricuspid Valve: The tricuspid valve is normal in structure. Tricuspid valve regurgitation is trivial. No evidence of tricuspid stenosis. Aortic Valve: The aortic valve is normal in structure. Aortic valve regurgitation is not visualized. Aortic valve sclerosis/calcification is present, without any evidence of aortic stenosis. Aortic valve mean gradient measures 5.0 mmHg. Aortic valve peak  gradient measures 11.3 mmHg. Aortic valve area, by VTI measures 3.00 cm. Pulmonic Valve: The  Weight:       175.7 lb Date of Birth:  1951-09-27              BSA:          1.934 m Patient Age:    72 years              BP:           146/71 mmHg Patient Gender: M                     HR:           71 bpm. Exam Location:  Inpatient Procedure: 2D Echo, Cardiac Doppler and Color Doppler Indications:    R06.9 DOE; R60.0 Lower extremity edema; R53.83 Fatigue  History:        Patient has no prior history of Echocardiogram examinations.                 Signs/Symptoms:Dyspnea, Edema and Fatigue; Risk                 Factors:Hypertension, Diabetes, Former Smoker, History of                 substance abuse. and Dyslipidemia. Patient denies chest pain. He                 does have DOE, leg edema and extreme fatigue since 06/06/2023.  Sonographer:    Carlos American RVT, RDCS (AE), RDMS Referring Phys: [id number] EMILY B MULLEN  Sonographer Comments: Suboptimal apical window. Image acquisition challenging due to respiratory motion. IMPRESSIONS  1. Left ventricular ejection fraction, by estimation, is 60 to 65%. The left ventricle has normal function. The left ventricle has no regional wall motion abnormalities. There is mild concentric left ventricular hypertrophy. Left ventricular diastolic parameters are consistent with Grade I diastolic dysfunction (impaired relaxation).  2. Right ventricular systolic function is normal. The right ventricular size is normal.  3. The mitral valve is normal in structure. Trivial mitral valve regurgitation. No evidence of mitral stenosis.  4. The aortic valve is normal in structure. Aortic valve regurgitation is not visualized. Aortic valve sclerosis/calcification is present, without any evidence of aortic stenosis.  5. The inferior vena cava is normal in size with greater than 50% respiratory variability, suggesting right atrial pressure of 3 mmHg. FINDINGS  Left Ventricle: Left ventricular ejection fraction, by estimation, is 60 to  65%. The left ventricle has normal function. The left ventricle has no regional wall motion abnormalities. The left ventricular internal cavity size was normal in size. There is  mild concentric left ventricular hypertrophy. Left ventricular diastolic parameters are consistent with Grade I diastolic dysfunction (impaired relaxation). Right Ventricle: The right ventricular size is normal. No increase in right ventricular wall thickness. Right ventricular systolic function is normal. Left Atrium: Left atrial size was normal in size. Right Atrium: Right atrial size was normal in size. Pericardium: There is no evidence of pericardial effusion. Mitral Valve: The mitral valve is normal in structure. Trivial mitral valve regurgitation. No evidence of mitral valve stenosis. Tricuspid Valve: The tricuspid valve is normal in structure. Tricuspid valve regurgitation is trivial. No evidence of tricuspid stenosis. Aortic Valve: The aortic valve is normal in structure. Aortic valve regurgitation is not visualized. Aortic valve sclerosis/calcification is present, without any evidence of aortic stenosis. Aortic valve mean gradient measures 5.0 mmHg. Aortic valve peak  gradient measures 11.3 mmHg. Aortic valve area, by VTI measures 3.00 cm. Pulmonic Valve: The  Weight:       175.7 lb Date of Birth:  1951-09-27              BSA:          1.934 m Patient Age:    72 years              BP:           146/71 mmHg Patient Gender: M                     HR:           71 bpm. Exam Location:  Inpatient Procedure: 2D Echo, Cardiac Doppler and Color Doppler Indications:    R06.9 DOE; R60.0 Lower extremity edema; R53.83 Fatigue  History:        Patient has no prior history of Echocardiogram examinations.                 Signs/Symptoms:Dyspnea, Edema and Fatigue; Risk                 Factors:Hypertension, Diabetes, Former Smoker, History of                 substance abuse. and Dyslipidemia. Patient denies chest pain. He                 does have DOE, leg edema and extreme fatigue since 06/06/2023.  Sonographer:    Carlos American RVT, RDCS (AE), RDMS Referring Phys: [id number] EMILY B MULLEN  Sonographer Comments: Suboptimal apical window. Image acquisition challenging due to respiratory motion. IMPRESSIONS  1. Left ventricular ejection fraction, by estimation, is 60 to 65%. The left ventricle has normal function. The left ventricle has no regional wall motion abnormalities. There is mild concentric left ventricular hypertrophy. Left ventricular diastolic parameters are consistent with Grade I diastolic dysfunction (impaired relaxation).  2. Right ventricular systolic function is normal. The right ventricular size is normal.  3. The mitral valve is normal in structure. Trivial mitral valve regurgitation. No evidence of mitral stenosis.  4. The aortic valve is normal in structure. Aortic valve regurgitation is not visualized. Aortic valve sclerosis/calcification is present, without any evidence of aortic stenosis.  5. The inferior vena cava is normal in size with greater than 50% respiratory variability, suggesting right atrial pressure of 3 mmHg. FINDINGS  Left Ventricle: Left ventricular ejection fraction, by estimation, is 60 to  65%. The left ventricle has normal function. The left ventricle has no regional wall motion abnormalities. The left ventricular internal cavity size was normal in size. There is  mild concentric left ventricular hypertrophy. Left ventricular diastolic parameters are consistent with Grade I diastolic dysfunction (impaired relaxation). Right Ventricle: The right ventricular size is normal. No increase in right ventricular wall thickness. Right ventricular systolic function is normal. Left Atrium: Left atrial size was normal in size. Right Atrium: Right atrial size was normal in size. Pericardium: There is no evidence of pericardial effusion. Mitral Valve: The mitral valve is normal in structure. Trivial mitral valve regurgitation. No evidence of mitral valve stenosis. Tricuspid Valve: The tricuspid valve is normal in structure. Tricuspid valve regurgitation is trivial. No evidence of tricuspid stenosis. Aortic Valve: The aortic valve is normal in structure. Aortic valve regurgitation is not visualized. Aortic valve sclerosis/calcification is present, without any evidence of aortic stenosis. Aortic valve mean gradient measures 5.0 mmHg. Aortic valve peak  gradient measures 11.3 mmHg. Aortic valve area, by VTI measures 3.00 cm. Pulmonic Valve: The  with contrast for further characterization and to assess for potential primary. PET-CT could also be an alternative imaging choice or adjunct. The patient has a history of prostate cancer but this would be an unusual metastatic pathway for prostate cancer, and a gastrointestinal primary or liver primary would be more common. The possibility of macronodular cirrhosis is not entirely discarded but seems less likely given the appearance. Electronically Signed   By: Gaylyn Rong M.D.   On: 06/30/2023 13:45   ECHOCARDIOGRAM COMPLETE  Result Date: 06/30/2023    ECHOCARDIOGRAM REPORT   Patient Name:   William Jones Date of Exam: 06/30/2023 Medical Rec #:  [id number]             Height:       68.0 in Accession #:    [id number]            Weight:       175.7 lb Date of Birth:  May 03, 1951              BSA:          1.934 m Patient Age:    72 years              BP:           146/71 mmHg Patient Gender: M                     HR:           71 bpm. Exam Location:  Inpatient Procedure: 2D Echo, Cardiac Doppler and Color Doppler Indications:    R06.9 DOE; R60.0 Lower extremity edema; R53.83 Fatigue  History:        Patient has no prior history of Echocardiogram examinations.                 Signs/Symptoms:Dyspnea, Edema and Fatigue; Risk                 Factors:Hypertension, Diabetes, Former Smoker, History  of                 substance abuse. and Dyslipidemia. Patient denies chest pain. He                 does have DOE, leg edema and extreme fatigue since 06/06/2023.  Sonographer:    Carlos American RVT, RDCS (AE), RDMS Referring Phys: [id number] EMILY B MULLEN  Sonographer Comments: Suboptimal apical window. Image acquisition challenging due to respiratory motion. IMPRESSIONS  1. Left ventricular ejection fraction, by estimation, is 60 to 65%. The left ventricle has normal function. The left ventricle has no regional wall motion abnormalities. There is mild concentric left ventricular hypertrophy. Left ventricular diastolic parameters are consistent with Grade I diastolic dysfunction (impaired relaxation).  2. Right ventricular systolic function is normal. The right ventricular size is normal.  3. The mitral valve is normal in structure. Trivial mitral valve regurgitation. No evidence of mitral stenosis.  4. The aortic valve is normal in structure. Aortic valve regurgitation is not visualized. Aortic valve sclerosis/calcification is present, without any evidence of aortic stenosis.  5. The inferior vena cava is normal in size with greater than 50% respiratory variability, suggesting right atrial pressure of 3 mmHg. FINDINGS  Left Ventricle: Left ventricular ejection fraction, by estimation, is 60 to 65%. The left ventricle has normal function. The left ventricle has no regional wall motion abnormalities. The left ventricular internal cavity size was normal in size. There is  mild concentric left ventricular hypertrophy.  with contrast for further characterization and to assess for potential primary. PET-CT could also be an alternative imaging choice or adjunct. The patient has a history of prostate cancer but this would be an unusual metastatic pathway for prostate cancer, and a gastrointestinal primary or liver primary would be more common. The possibility of macronodular cirrhosis is not entirely discarded but seems less likely given the appearance. Electronically Signed   By: Gaylyn Rong M.D.   On: 06/30/2023 13:45   ECHOCARDIOGRAM COMPLETE  Result Date: 06/30/2023    ECHOCARDIOGRAM REPORT   Patient Name:   William Jones Date of Exam: 06/30/2023 Medical Rec #:  [id number]             Height:       68.0 in Accession #:    [id number]            Weight:       175.7 lb Date of Birth:  May 03, 1951              BSA:          1.934 m Patient Age:    72 years              BP:           146/71 mmHg Patient Gender: M                     HR:           71 bpm. Exam Location:  Inpatient Procedure: 2D Echo, Cardiac Doppler and Color Doppler Indications:    R06.9 DOE; R60.0 Lower extremity edema; R53.83 Fatigue  History:        Patient has no prior history of Echocardiogram examinations.                 Signs/Symptoms:Dyspnea, Edema and Fatigue; Risk                 Factors:Hypertension, Diabetes, Former Smoker, History  of                 substance abuse. and Dyslipidemia. Patient denies chest pain. He                 does have DOE, leg edema and extreme fatigue since 06/06/2023.  Sonographer:    Carlos American RVT, RDCS (AE), RDMS Referring Phys: [id number] EMILY B MULLEN  Sonographer Comments: Suboptimal apical window. Image acquisition challenging due to respiratory motion. IMPRESSIONS  1. Left ventricular ejection fraction, by estimation, is 60 to 65%. The left ventricle has normal function. The left ventricle has no regional wall motion abnormalities. There is mild concentric left ventricular hypertrophy. Left ventricular diastolic parameters are consistent with Grade I diastolic dysfunction (impaired relaxation).  2. Right ventricular systolic function is normal. The right ventricular size is normal.  3. The mitral valve is normal in structure. Trivial mitral valve regurgitation. No evidence of mitral stenosis.  4. The aortic valve is normal in structure. Aortic valve regurgitation is not visualized. Aortic valve sclerosis/calcification is present, without any evidence of aortic stenosis.  5. The inferior vena cava is normal in size with greater than 50% respiratory variability, suggesting right atrial pressure of 3 mmHg. FINDINGS  Left Ventricle: Left ventricular ejection fraction, by estimation, is 60 to 65%. The left ventricle has normal function. The left ventricle has no regional wall motion abnormalities. The left ventricular internal cavity size was normal in size. There is  mild concentric left ventricular hypertrophy.  with contrast for further characterization and to assess for potential primary. PET-CT could also be an alternative imaging choice or adjunct. The patient has a history of prostate cancer but this would be an unusual metastatic pathway for prostate cancer, and a gastrointestinal primary or liver primary would be more common. The possibility of macronodular cirrhosis is not entirely discarded but seems less likely given the appearance. Electronically Signed   By: Gaylyn Rong M.D.   On: 06/30/2023 13:45   ECHOCARDIOGRAM COMPLETE  Result Date: 06/30/2023    ECHOCARDIOGRAM REPORT   Patient Name:   William Jones Date of Exam: 06/30/2023 Medical Rec #:  [id number]             Height:       68.0 in Accession #:    [id number]            Weight:       175.7 lb Date of Birth:  May 03, 1951              BSA:          1.934 m Patient Age:    72 years              BP:           146/71 mmHg Patient Gender: M                     HR:           71 bpm. Exam Location:  Inpatient Procedure: 2D Echo, Cardiac Doppler and Color Doppler Indications:    R06.9 DOE; R60.0 Lower extremity edema; R53.83 Fatigue  History:        Patient has no prior history of Echocardiogram examinations.                 Signs/Symptoms:Dyspnea, Edema and Fatigue; Risk                 Factors:Hypertension, Diabetes, Former Smoker, History  of                 substance abuse. and Dyslipidemia. Patient denies chest pain. He                 does have DOE, leg edema and extreme fatigue since 06/06/2023.  Sonographer:    Carlos American RVT, RDCS (AE), RDMS Referring Phys: [id number] EMILY B MULLEN  Sonographer Comments: Suboptimal apical window. Image acquisition challenging due to respiratory motion. IMPRESSIONS  1. Left ventricular ejection fraction, by estimation, is 60 to 65%. The left ventricle has normal function. The left ventricle has no regional wall motion abnormalities. There is mild concentric left ventricular hypertrophy. Left ventricular diastolic parameters are consistent with Grade I diastolic dysfunction (impaired relaxation).  2. Right ventricular systolic function is normal. The right ventricular size is normal.  3. The mitral valve is normal in structure. Trivial mitral valve regurgitation. No evidence of mitral stenosis.  4. The aortic valve is normal in structure. Aortic valve regurgitation is not visualized. Aortic valve sclerosis/calcification is present, without any evidence of aortic stenosis.  5. The inferior vena cava is normal in size with greater than 50% respiratory variability, suggesting right atrial pressure of 3 mmHg. FINDINGS  Left Ventricle: Left ventricular ejection fraction, by estimation, is 60 to 65%. The left ventricle has normal function. The left ventricle has no regional wall motion abnormalities. The left ventricular internal cavity size was normal in size. There is  mild concentric left ventricular hypertrophy.  with contrast for further characterization and to assess for potential primary. PET-CT could also be an alternative imaging choice or adjunct. The patient has a history of prostate cancer but this would be an unusual metastatic pathway for prostate cancer, and a gastrointestinal primary or liver primary would be more common. The possibility of macronodular cirrhosis is not entirely discarded but seems less likely given the appearance. Electronically Signed   By: Gaylyn Rong M.D.   On: 06/30/2023 13:45   ECHOCARDIOGRAM COMPLETE  Result Date: 06/30/2023    ECHOCARDIOGRAM REPORT   Patient Name:   William Jones Date of Exam: 06/30/2023 Medical Rec #:  [id number]             Height:       68.0 in Accession #:    [id number]            Weight:       175.7 lb Date of Birth:  May 03, 1951              BSA:          1.934 m Patient Age:    72 years              BP:           146/71 mmHg Patient Gender: M                     HR:           71 bpm. Exam Location:  Inpatient Procedure: 2D Echo, Cardiac Doppler and Color Doppler Indications:    R06.9 DOE; R60.0 Lower extremity edema; R53.83 Fatigue  History:        Patient has no prior history of Echocardiogram examinations.                 Signs/Symptoms:Dyspnea, Edema and Fatigue; Risk                 Factors:Hypertension, Diabetes, Former Smoker, History  of                 substance abuse. and Dyslipidemia. Patient denies chest pain. He                 does have DOE, leg edema and extreme fatigue since 06/06/2023.  Sonographer:    Carlos American RVT, RDCS (AE), RDMS Referring Phys: [id number] EMILY B MULLEN  Sonographer Comments: Suboptimal apical window. Image acquisition challenging due to respiratory motion. IMPRESSIONS  1. Left ventricular ejection fraction, by estimation, is 60 to 65%. The left ventricle has normal function. The left ventricle has no regional wall motion abnormalities. There is mild concentric left ventricular hypertrophy. Left ventricular diastolic parameters are consistent with Grade I diastolic dysfunction (impaired relaxation).  2. Right ventricular systolic function is normal. The right ventricular size is normal.  3. The mitral valve is normal in structure. Trivial mitral valve regurgitation. No evidence of mitral stenosis.  4. The aortic valve is normal in structure. Aortic valve regurgitation is not visualized. Aortic valve sclerosis/calcification is present, without any evidence of aortic stenosis.  5. The inferior vena cava is normal in size with greater than 50% respiratory variability, suggesting right atrial pressure of 3 mmHg. FINDINGS  Left Ventricle: Left ventricular ejection fraction, by estimation, is 60 to 65%. The left ventricle has normal function. The left ventricle has no regional wall motion abnormalities. The left ventricular internal cavity size was normal in size. There is  mild concentric left ventricular hypertrophy.  with contrast for further characterization and to assess for potential primary. PET-CT could also be an alternative imaging choice or adjunct. The patient has a history of prostate cancer but this would be an unusual metastatic pathway for prostate cancer, and a gastrointestinal primary or liver primary would be more common. The possibility of macronodular cirrhosis is not entirely discarded but seems less likely given the appearance. Electronically Signed   By: Gaylyn Rong M.D.   On: 06/30/2023 13:45   ECHOCARDIOGRAM COMPLETE  Result Date: 06/30/2023    ECHOCARDIOGRAM REPORT   Patient Name:   William Jones Date of Exam: 06/30/2023 Medical Rec #:  [id number]             Height:       68.0 in Accession #:    [id number]            Weight:       175.7 lb Date of Birth:  May 03, 1951              BSA:          1.934 m Patient Age:    72 years              BP:           146/71 mmHg Patient Gender: M                     HR:           71 bpm. Exam Location:  Inpatient Procedure: 2D Echo, Cardiac Doppler and Color Doppler Indications:    R06.9 DOE; R60.0 Lower extremity edema; R53.83 Fatigue  History:        Patient has no prior history of Echocardiogram examinations.                 Signs/Symptoms:Dyspnea, Edema and Fatigue; Risk                 Factors:Hypertension, Diabetes, Former Smoker, History  of                 substance abuse. and Dyslipidemia. Patient denies chest pain. He                 does have DOE, leg edema and extreme fatigue since 06/06/2023.  Sonographer:    Carlos American RVT, RDCS (AE), RDMS Referring Phys: [id number] EMILY B MULLEN  Sonographer Comments: Suboptimal apical window. Image acquisition challenging due to respiratory motion. IMPRESSIONS  1. Left ventricular ejection fraction, by estimation, is 60 to 65%. The left ventricle has normal function. The left ventricle has no regional wall motion abnormalities. There is mild concentric left ventricular hypertrophy. Left ventricular diastolic parameters are consistent with Grade I diastolic dysfunction (impaired relaxation).  2. Right ventricular systolic function is normal. The right ventricular size is normal.  3. The mitral valve is normal in structure. Trivial mitral valve regurgitation. No evidence of mitral stenosis.  4. The aortic valve is normal in structure. Aortic valve regurgitation is not visualized. Aortic valve sclerosis/calcification is present, without any evidence of aortic stenosis.  5. The inferior vena cava is normal in size with greater than 50% respiratory variability, suggesting right atrial pressure of 3 mmHg. FINDINGS  Left Ventricle: Left ventricular ejection fraction, by estimation, is 60 to 65%. The left ventricle has normal function. The left ventricle has no regional wall motion abnormalities. The left ventricular internal cavity size was normal in size. There is  mild concentric left ventricular hypertrophy.  with contrast for further characterization and to assess for potential primary. PET-CT could also be an alternative imaging choice or adjunct. The patient has a history of prostate cancer but this would be an unusual metastatic pathway for prostate cancer, and a gastrointestinal primary or liver primary would be more common. The possibility of macronodular cirrhosis is not entirely discarded but seems less likely given the appearance. Electronically Signed   By: Gaylyn Rong M.D.   On: 06/30/2023 13:45   ECHOCARDIOGRAM COMPLETE  Result Date: 06/30/2023    ECHOCARDIOGRAM REPORT   Patient Name:   William Jones Date of Exam: 06/30/2023 Medical Rec #:  [id number]             Height:       68.0 in Accession #:    [id number]            Weight:       175.7 lb Date of Birth:  May 03, 1951              BSA:          1.934 m Patient Age:    72 years              BP:           146/71 mmHg Patient Gender: M                     HR:           71 bpm. Exam Location:  Inpatient Procedure: 2D Echo, Cardiac Doppler and Color Doppler Indications:    R06.9 DOE; R60.0 Lower extremity edema; R53.83 Fatigue  History:        Patient has no prior history of Echocardiogram examinations.                 Signs/Symptoms:Dyspnea, Edema and Fatigue; Risk                 Factors:Hypertension, Diabetes, Former Smoker, History  of                 substance abuse. and Dyslipidemia. Patient denies chest pain. He                 does have DOE, leg edema and extreme fatigue since 06/06/2023.  Sonographer:    Carlos American RVT, RDCS (AE), RDMS Referring Phys: [id number] EMILY B MULLEN  Sonographer Comments: Suboptimal apical window. Image acquisition challenging due to respiratory motion. IMPRESSIONS  1. Left ventricular ejection fraction, by estimation, is 60 to 65%. The left ventricle has normal function. The left ventricle has no regional wall motion abnormalities. There is mild concentric left ventricular hypertrophy. Left ventricular diastolic parameters are consistent with Grade I diastolic dysfunction (impaired relaxation).  2. Right ventricular systolic function is normal. The right ventricular size is normal.  3. The mitral valve is normal in structure. Trivial mitral valve regurgitation. No evidence of mitral stenosis.  4. The aortic valve is normal in structure. Aortic valve regurgitation is not visualized. Aortic valve sclerosis/calcification is present, without any evidence of aortic stenosis.  5. The inferior vena cava is normal in size with greater than 50% respiratory variability, suggesting right atrial pressure of 3 mmHg. FINDINGS  Left Ventricle: Left ventricular ejection fraction, by estimation, is 60 to 65%. The left ventricle has normal function. The left ventricle has no regional wall motion abnormalities. The left ventricular internal cavity size was normal in size. There is  mild concentric left ventricular hypertrophy.  with contrast for further characterization and to assess for potential primary. PET-CT could also be an alternative imaging choice or adjunct. The patient has a history of prostate cancer but this would be an unusual metastatic pathway for prostate cancer, and a gastrointestinal primary or liver primary would be more common. The possibility of macronodular cirrhosis is not entirely discarded but seems less likely given the appearance. Electronically Signed   By: Gaylyn Rong M.D.   On: 06/30/2023 13:45   ECHOCARDIOGRAM COMPLETE  Result Date: 06/30/2023    ECHOCARDIOGRAM REPORT   Patient Name:   William Jones Date of Exam: 06/30/2023 Medical Rec #:  [id number]             Height:       68.0 in Accession #:    [id number]            Weight:       175.7 lb Date of Birth:  May 03, 1951              BSA:          1.934 m Patient Age:    72 years              BP:           146/71 mmHg Patient Gender: M                     HR:           71 bpm. Exam Location:  Inpatient Procedure: 2D Echo, Cardiac Doppler and Color Doppler Indications:    R06.9 DOE; R60.0 Lower extremity edema; R53.83 Fatigue  History:        Patient has no prior history of Echocardiogram examinations.                 Signs/Symptoms:Dyspnea, Edema and Fatigue; Risk                 Factors:Hypertension, Diabetes, Former Smoker, History  of                 substance abuse. and Dyslipidemia. Patient denies chest pain. He                 does have DOE, leg edema and extreme fatigue since 06/06/2023.  Sonographer:    Carlos American RVT, RDCS (AE), RDMS Referring Phys: [id number] EMILY B MULLEN  Sonographer Comments: Suboptimal apical window. Image acquisition challenging due to respiratory motion. IMPRESSIONS  1. Left ventricular ejection fraction, by estimation, is 60 to 65%. The left ventricle has normal function. The left ventricle has no regional wall motion abnormalities. There is mild concentric left ventricular hypertrophy. Left ventricular diastolic parameters are consistent with Grade I diastolic dysfunction (impaired relaxation).  2. Right ventricular systolic function is normal. The right ventricular size is normal.  3. The mitral valve is normal in structure. Trivial mitral valve regurgitation. No evidence of mitral stenosis.  4. The aortic valve is normal in structure. Aortic valve regurgitation is not visualized. Aortic valve sclerosis/calcification is present, without any evidence of aortic stenosis.  5. The inferior vena cava is normal in size with greater than 50% respiratory variability, suggesting right atrial pressure of 3 mmHg. FINDINGS  Left Ventricle: Left ventricular ejection fraction, by estimation, is 60 to 65%. The left ventricle has normal function. The left ventricle has no regional wall motion abnormalities. The left ventricular internal cavity size was normal in size. There is  mild concentric left ventricular hypertrophy.  Weight:       175.7 lb Date of Birth:  1951-09-27              BSA:          1.934 m Patient Age:    72 years              BP:           146/71 mmHg Patient Gender: M                     HR:           71 bpm. Exam Location:  Inpatient Procedure: 2D Echo, Cardiac Doppler and Color Doppler Indications:    R06.9 DOE; R60.0 Lower extremity edema; R53.83 Fatigue  History:        Patient has no prior history of Echocardiogram examinations.                 Signs/Symptoms:Dyspnea, Edema and Fatigue; Risk                 Factors:Hypertension, Diabetes, Former Smoker, History of                 substance abuse. and Dyslipidemia. Patient denies chest pain. He                 does have DOE, leg edema and extreme fatigue since 06/06/2023.  Sonographer:    Carlos American RVT, RDCS (AE), RDMS Referring Phys: [id number] EMILY B MULLEN  Sonographer Comments: Suboptimal apical window. Image acquisition challenging due to respiratory motion. IMPRESSIONS  1. Left ventricular ejection fraction, by estimation, is 60 to 65%. The left ventricle has normal function. The left ventricle has no regional wall motion abnormalities. There is mild concentric left ventricular hypertrophy. Left ventricular diastolic parameters are consistent with Grade I diastolic dysfunction (impaired relaxation).  2. Right ventricular systolic function is normal. The right ventricular size is normal.  3. The mitral valve is normal in structure. Trivial mitral valve regurgitation. No evidence of mitral stenosis.  4. The aortic valve is normal in structure. Aortic valve regurgitation is not visualized. Aortic valve sclerosis/calcification is present, without any evidence of aortic stenosis.  5. The inferior vena cava is normal in size with greater than 50% respiratory variability, suggesting right atrial pressure of 3 mmHg. FINDINGS  Left Ventricle: Left ventricular ejection fraction, by estimation, is 60 to  65%. The left ventricle has normal function. The left ventricle has no regional wall motion abnormalities. The left ventricular internal cavity size was normal in size. There is  mild concentric left ventricular hypertrophy. Left ventricular diastolic parameters are consistent with Grade I diastolic dysfunction (impaired relaxation). Right Ventricle: The right ventricular size is normal. No increase in right ventricular wall thickness. Right ventricular systolic function is normal. Left Atrium: Left atrial size was normal in size. Right Atrium: Right atrial size was normal in size. Pericardium: There is no evidence of pericardial effusion. Mitral Valve: The mitral valve is normal in structure. Trivial mitral valve regurgitation. No evidence of mitral valve stenosis. Tricuspid Valve: The tricuspid valve is normal in structure. Tricuspid valve regurgitation is trivial. No evidence of tricuspid stenosis. Aortic Valve: The aortic valve is normal in structure. Aortic valve regurgitation is not visualized. Aortic valve sclerosis/calcification is present, without any evidence of aortic stenosis. Aortic valve mean gradient measures 5.0 mmHg. Aortic valve peak  gradient measures 11.3 mmHg. Aortic valve area, by VTI measures 3.00 cm. Pulmonic Valve: The  Weight:       175.7 lb Date of Birth:  1951-09-27              BSA:          1.934 m Patient Age:    72 years              BP:           146/71 mmHg Patient Gender: M                     HR:           71 bpm. Exam Location:  Inpatient Procedure: 2D Echo, Cardiac Doppler and Color Doppler Indications:    R06.9 DOE; R60.0 Lower extremity edema; R53.83 Fatigue  History:        Patient has no prior history of Echocardiogram examinations.                 Signs/Symptoms:Dyspnea, Edema and Fatigue; Risk                 Factors:Hypertension, Diabetes, Former Smoker, History of                 substance abuse. and Dyslipidemia. Patient denies chest pain. He                 does have DOE, leg edema and extreme fatigue since 06/06/2023.  Sonographer:    Carlos American RVT, RDCS (AE), RDMS Referring Phys: [id number] EMILY B MULLEN  Sonographer Comments: Suboptimal apical window. Image acquisition challenging due to respiratory motion. IMPRESSIONS  1. Left ventricular ejection fraction, by estimation, is 60 to 65%. The left ventricle has normal function. The left ventricle has no regional wall motion abnormalities. There is mild concentric left ventricular hypertrophy. Left ventricular diastolic parameters are consistent with Grade I diastolic dysfunction (impaired relaxation).  2. Right ventricular systolic function is normal. The right ventricular size is normal.  3. The mitral valve is normal in structure. Trivial mitral valve regurgitation. No evidence of mitral stenosis.  4. The aortic valve is normal in structure. Aortic valve regurgitation is not visualized. Aortic valve sclerosis/calcification is present, without any evidence of aortic stenosis.  5. The inferior vena cava is normal in size with greater than 50% respiratory variability, suggesting right atrial pressure of 3 mmHg. FINDINGS  Left Ventricle: Left ventricular ejection fraction, by estimation, is 60 to  65%. The left ventricle has normal function. The left ventricle has no regional wall motion abnormalities. The left ventricular internal cavity size was normal in size. There is  mild concentric left ventricular hypertrophy. Left ventricular diastolic parameters are consistent with Grade I diastolic dysfunction (impaired relaxation). Right Ventricle: The right ventricular size is normal. No increase in right ventricular wall thickness. Right ventricular systolic function is normal. Left Atrium: Left atrial size was normal in size. Right Atrium: Right atrial size was normal in size. Pericardium: There is no evidence of pericardial effusion. Mitral Valve: The mitral valve is normal in structure. Trivial mitral valve regurgitation. No evidence of mitral valve stenosis. Tricuspid Valve: The tricuspid valve is normal in structure. Tricuspid valve regurgitation is trivial. No evidence of tricuspid stenosis. Aortic Valve: The aortic valve is normal in structure. Aortic valve regurgitation is not visualized. Aortic valve sclerosis/calcification is present, without any evidence of aortic stenosis. Aortic valve mean gradient measures 5.0 mmHg. Aortic valve peak  gradient measures 11.3 mmHg. Aortic valve area, by VTI measures 3.00 cm. Pulmonic Valve: The  with contrast for further characterization and to assess for potential primary. PET-CT could also be an alternative imaging choice or adjunct. The patient has a history of prostate cancer but this would be an unusual metastatic pathway for prostate cancer, and a gastrointestinal primary or liver primary would be more common. The possibility of macronodular cirrhosis is not entirely discarded but seems less likely given the appearance. Electronically Signed   By: Gaylyn Rong M.D.   On: 06/30/2023 13:45   ECHOCARDIOGRAM COMPLETE  Result Date: 06/30/2023    ECHOCARDIOGRAM REPORT   Patient Name:   William Jones Date of Exam: 06/30/2023 Medical Rec #:  [id number]             Height:       68.0 in Accession #:    [id number]            Weight:       175.7 lb Date of Birth:  May 03, 1951              BSA:          1.934 m Patient Age:    72 years              BP:           146/71 mmHg Patient Gender: M                     HR:           71 bpm. Exam Location:  Inpatient Procedure: 2D Echo, Cardiac Doppler and Color Doppler Indications:    R06.9 DOE; R60.0 Lower extremity edema; R53.83 Fatigue  History:        Patient has no prior history of Echocardiogram examinations.                 Signs/Symptoms:Dyspnea, Edema and Fatigue; Risk                 Factors:Hypertension, Diabetes, Former Smoker, History  of                 substance abuse. and Dyslipidemia. Patient denies chest pain. He                 does have DOE, leg edema and extreme fatigue since 06/06/2023.  Sonographer:    Carlos American RVT, RDCS (AE), RDMS Referring Phys: [id number] EMILY B MULLEN  Sonographer Comments: Suboptimal apical window. Image acquisition challenging due to respiratory motion. IMPRESSIONS  1. Left ventricular ejection fraction, by estimation, is 60 to 65%. The left ventricle has normal function. The left ventricle has no regional wall motion abnormalities. There is mild concentric left ventricular hypertrophy. Left ventricular diastolic parameters are consistent with Grade I diastolic dysfunction (impaired relaxation).  2. Right ventricular systolic function is normal. The right ventricular size is normal.  3. The mitral valve is normal in structure. Trivial mitral valve regurgitation. No evidence of mitral stenosis.  4. The aortic valve is normal in structure. Aortic valve regurgitation is not visualized. Aortic valve sclerosis/calcification is present, without any evidence of aortic stenosis.  5. The inferior vena cava is normal in size with greater than 50% respiratory variability, suggesting right atrial pressure of 3 mmHg. FINDINGS  Left Ventricle: Left ventricular ejection fraction, by estimation, is 60 to 65%. The left ventricle has normal function. The left ventricle has no regional wall motion abnormalities. The left ventricular internal cavity size was normal in size. There is  mild concentric left ventricular hypertrophy.  Weight:       175.7 lb Date of Birth:  1951-09-27              BSA:          1.934 m Patient Age:    72 years              BP:           146/71 mmHg Patient Gender: M                     HR:           71 bpm. Exam Location:  Inpatient Procedure: 2D Echo, Cardiac Doppler and Color Doppler Indications:    R06.9 DOE; R60.0 Lower extremity edema; R53.83 Fatigue  History:        Patient has no prior history of Echocardiogram examinations.                 Signs/Symptoms:Dyspnea, Edema and Fatigue; Risk                 Factors:Hypertension, Diabetes, Former Smoker, History of                 substance abuse. and Dyslipidemia. Patient denies chest pain. He                 does have DOE, leg edema and extreme fatigue since 06/06/2023.  Sonographer:    Carlos American RVT, RDCS (AE), RDMS Referring Phys: [id number] EMILY B MULLEN  Sonographer Comments: Suboptimal apical window. Image acquisition challenging due to respiratory motion. IMPRESSIONS  1. Left ventricular ejection fraction, by estimation, is 60 to 65%. The left ventricle has normal function. The left ventricle has no regional wall motion abnormalities. There is mild concentric left ventricular hypertrophy. Left ventricular diastolic parameters are consistent with Grade I diastolic dysfunction (impaired relaxation).  2. Right ventricular systolic function is normal. The right ventricular size is normal.  3. The mitral valve is normal in structure. Trivial mitral valve regurgitation. No evidence of mitral stenosis.  4. The aortic valve is normal in structure. Aortic valve regurgitation is not visualized. Aortic valve sclerosis/calcification is present, without any evidence of aortic stenosis.  5. The inferior vena cava is normal in size with greater than 50% respiratory variability, suggesting right atrial pressure of 3 mmHg. FINDINGS  Left Ventricle: Left ventricular ejection fraction, by estimation, is 60 to  65%. The left ventricle has normal function. The left ventricle has no regional wall motion abnormalities. The left ventricular internal cavity size was normal in size. There is  mild concentric left ventricular hypertrophy. Left ventricular diastolic parameters are consistent with Grade I diastolic dysfunction (impaired relaxation). Right Ventricle: The right ventricular size is normal. No increase in right ventricular wall thickness. Right ventricular systolic function is normal. Left Atrium: Left atrial size was normal in size. Right Atrium: Right atrial size was normal in size. Pericardium: There is no evidence of pericardial effusion. Mitral Valve: The mitral valve is normal in structure. Trivial mitral valve regurgitation. No evidence of mitral valve stenosis. Tricuspid Valve: The tricuspid valve is normal in structure. Tricuspid valve regurgitation is trivial. No evidence of tricuspid stenosis. Aortic Valve: The aortic valve is normal in structure. Aortic valve regurgitation is not visualized. Aortic valve sclerosis/calcification is present, without any evidence of aortic stenosis. Aortic valve mean gradient measures 5.0 mmHg. Aortic valve peak  gradient measures 11.3 mmHg. Aortic valve area, by VTI measures 3.00 cm. Pulmonic Valve: The  with contrast for further characterization and to assess for potential primary. PET-CT could also be an alternative imaging choice or adjunct. The patient has a history of prostate cancer but this would be an unusual metastatic pathway for prostate cancer, and a gastrointestinal primary or liver primary would be more common. The possibility of macronodular cirrhosis is not entirely discarded but seems less likely given the appearance. Electronically Signed   By: Gaylyn Rong M.D.   On: 06/30/2023 13:45   ECHOCARDIOGRAM COMPLETE  Result Date: 06/30/2023    ECHOCARDIOGRAM REPORT   Patient Name:   William Jones Date of Exam: 06/30/2023 Medical Rec #:  [id number]             Height:       68.0 in Accession #:    [id number]            Weight:       175.7 lb Date of Birth:  May 03, 1951              BSA:          1.934 m Patient Age:    72 years              BP:           146/71 mmHg Patient Gender: M                     HR:           71 bpm. Exam Location:  Inpatient Procedure: 2D Echo, Cardiac Doppler and Color Doppler Indications:    R06.9 DOE; R60.0 Lower extremity edema; R53.83 Fatigue  History:        Patient has no prior history of Echocardiogram examinations.                 Signs/Symptoms:Dyspnea, Edema and Fatigue; Risk                 Factors:Hypertension, Diabetes, Former Smoker, History  of                 substance abuse. and Dyslipidemia. Patient denies chest pain. He                 does have DOE, leg edema and extreme fatigue since 06/06/2023.  Sonographer:    Carlos American RVT, RDCS (AE), RDMS Referring Phys: [id number] EMILY B MULLEN  Sonographer Comments: Suboptimal apical window. Image acquisition challenging due to respiratory motion. IMPRESSIONS  1. Left ventricular ejection fraction, by estimation, is 60 to 65%. The left ventricle has normal function. The left ventricle has no regional wall motion abnormalities. There is mild concentric left ventricular hypertrophy. Left ventricular diastolic parameters are consistent with Grade I diastolic dysfunction (impaired relaxation).  2. Right ventricular systolic function is normal. The right ventricular size is normal.  3. The mitral valve is normal in structure. Trivial mitral valve regurgitation. No evidence of mitral stenosis.  4. The aortic valve is normal in structure. Aortic valve regurgitation is not visualized. Aortic valve sclerosis/calcification is present, without any evidence of aortic stenosis.  5. The inferior vena cava is normal in size with greater than 50% respiratory variability, suggesting right atrial pressure of 3 mmHg. FINDINGS  Left Ventricle: Left ventricular ejection fraction, by estimation, is 60 to 65%. The left ventricle has normal function. The left ventricle has no regional wall motion abnormalities. The left ventricular internal cavity size was normal in size. There is  mild concentric left ventricular hypertrophy.  Weight:       175.7 lb Date of Birth:  1951-09-27              BSA:          1.934 m Patient Age:    72 years              BP:           146/71 mmHg Patient Gender: M                     HR:           71 bpm. Exam Location:  Inpatient Procedure: 2D Echo, Cardiac Doppler and Color Doppler Indications:    R06.9 DOE; R60.0 Lower extremity edema; R53.83 Fatigue  History:        Patient has no prior history of Echocardiogram examinations.                 Signs/Symptoms:Dyspnea, Edema and Fatigue; Risk                 Factors:Hypertension, Diabetes, Former Smoker, History of                 substance abuse. and Dyslipidemia. Patient denies chest pain. He                 does have DOE, leg edema and extreme fatigue since 06/06/2023.  Sonographer:    Carlos American RVT, RDCS (AE), RDMS Referring Phys: [id number] EMILY B MULLEN  Sonographer Comments: Suboptimal apical window. Image acquisition challenging due to respiratory motion. IMPRESSIONS  1. Left ventricular ejection fraction, by estimation, is 60 to 65%. The left ventricle has normal function. The left ventricle has no regional wall motion abnormalities. There is mild concentric left ventricular hypertrophy. Left ventricular diastolic parameters are consistent with Grade I diastolic dysfunction (impaired relaxation).  2. Right ventricular systolic function is normal. The right ventricular size is normal.  3. The mitral valve is normal in structure. Trivial mitral valve regurgitation. No evidence of mitral stenosis.  4. The aortic valve is normal in structure. Aortic valve regurgitation is not visualized. Aortic valve sclerosis/calcification is present, without any evidence of aortic stenosis.  5. The inferior vena cava is normal in size with greater than 50% respiratory variability, suggesting right atrial pressure of 3 mmHg. FINDINGS  Left Ventricle: Left ventricular ejection fraction, by estimation, is 60 to  65%. The left ventricle has normal function. The left ventricle has no regional wall motion abnormalities. The left ventricular internal cavity size was normal in size. There is  mild concentric left ventricular hypertrophy. Left ventricular diastolic parameters are consistent with Grade I diastolic dysfunction (impaired relaxation). Right Ventricle: The right ventricular size is normal. No increase in right ventricular wall thickness. Right ventricular systolic function is normal. Left Atrium: Left atrial size was normal in size. Right Atrium: Right atrial size was normal in size. Pericardium: There is no evidence of pericardial effusion. Mitral Valve: The mitral valve is normal in structure. Trivial mitral valve regurgitation. No evidence of mitral valve stenosis. Tricuspid Valve: The tricuspid valve is normal in structure. Tricuspid valve regurgitation is trivial. No evidence of tricuspid stenosis. Aortic Valve: The aortic valve is normal in structure. Aortic valve regurgitation is not visualized. Aortic valve sclerosis/calcification is present, without any evidence of aortic stenosis. Aortic valve mean gradient measures 5.0 mmHg. Aortic valve peak  gradient measures 11.3 mmHg. Aortic valve area, by VTI measures 3.00 cm. Pulmonic Valve: The

## 2023-07-14 NOTE — Plan of Care (Signed)
  Problem: Education: Goal: Knowledge of General Education information will improve Description: Including pain rating scale, medication(s)/side effects and non-pharmacologic comfort measures Outcome: Progressing   Problem: Clinical Measurements: Goal: Ability to maintain clinical measurements within normal limits will improve Outcome: Progressing Goal: Diagnostic test results will improve Outcome: Progressing   Problem: Education: Goal: Knowledge of the prescribed therapeutic regimen will improve Outcome: Progressing   Problem: Activity: Goal: Ability to implement measures to reduce episodes of fatigue will improve Outcome: Progressing   Problem: Coping: Goal: Ability to identify and develop effective coping behavior will improve Outcome: Progressing

## 2023-07-14 NOTE — Progress Notes (Signed)
PROGRESS NOTE    Revel Hepper [place]  ZOX:[id number] DOB: [birth year] DOA: 07/13/2023 PCP: Morene Crocker, MD    Brief Narrative:  Mr. Staude is a 72 year old M with HTN, HLD, and prostate cancer, recently admitted for generalized weakness, found to have new metastatic neuroendocrine tumor to liver and adrenals.  Was discharged home with plans for outpatient chemo start, but became profoundly weak, returned to the hospital    In the ER,transaminitis due to tumor, mild hypokalemia, WBC normal, no fever.  UA with pyuria.   10/6: Started on CTX, admitted for weakness, Xtandi held 10/7: Still weak but stable, Oncology consulted, planned for inpatient chemo 10/8: Patient acutely tachycardic, weak --> Lactate >9 --> transferred to ICU and antibiotics broadened, CCM consulted  10/9: Patient with hemoglobin less than 7.  Has history of bleeding gastric ulcer and 2 units of blood transfusion a month ago.  He was not able to continue Protonix with suspected allergy.   Assessment & Plan:   Septic shock and lactic acidosis: Lactic acid likely due to slow clearance from liver disease.  Blood pressure has been more or less stable.  Blood cultures negative. Urine cultures with Klebsiella oxytoca, antibiotic broadened to meropenem and vancomycin. On stress dose of steroids.  Underwent CT angiogram of the chest, CT scan abdomen pelvis 10/8 that shows Multifocal pneumonia, possibly atypical organism Innumerable liver masses consistent with metastatic disease, multiple adrenal masses Pancreatic edema. With clinical improvement Discontinue meropenem and vancomycin, de-escalate antibiotics to Unasyn to cover Klebsiella UTI and pneumonia.  Mobilize in the hallway.  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  Oncology following.  Unlikely ready for chemotherapy with current level of weakness, serious illness requiring ICU admission.   Hypokalemia,  hypophosphatemia: Severe and persistent.  Replace further today. Replace aggressively and recheck levels   Anemia , acute on chronic blood loss: 1 unit of PRBC transfused 10/9. Known gastric ulcer, endoscopy about a month ago.  Reported not feeling well with Protonix and did not continue.  Patient likely has recurrent gastric ulcer. Started on Protonix.  Hemoglobin appropriately responded and is stable today. If drops further will need upper GI endoscopy.  Will notify his gastroenterologist, Dr. Elnoria Howard.  Abnormal liver function test, transaminitis.  Worsening due to shock liver.  Slight improvement today.  Continue to monitor.  Essential hypertension: Blood pressure medications on hold at risk of hypotension.  Prostate cancer: Hold Xtandi for now.      DVT prophylaxis: Place and maintain sequential compression device Start: 07/14/23 1101   Code Status: Full code Family Communication: None today. Disposition Plan: Status is: Inpatient Remains inpatient appropriate because: IV antibiotics debility, acute infection, GI bleeding     Consultants:  Oncology Critical care GI,  Procedures:  None  Antimicrobials:  Vancomycin meropenem 10/8--- 10/10 Unasyn 10/10---   Subjective:  Patient seen and examined.  Today he denies any complaints.  He did receive Protonix last night and had not experienced any allergic reactions.  He tells me that he was able to eat some chopped food.  No bowel movement since yesterday morning.  No family at the bedside today.  His daughter was at the bedside last night.  Objective: Vitals:   07/14/23 0835 07/14/23 0900 07/14/23 1000 07/14/23 1100  BP:  (!) 160/80 (!) 172/94 125/67  Pulse:      Resp:  18 (!) 21 (!) 24  Temp:      TempSrc:      SpO2: (!) 88%  Weight:      Height:        Intake/Output Summary (Last 24 hours) at 07/14/2023 1116 Last data filed at 07/14/2023 0922 Gross per 24 hour  Intake 875.07 ml  Output 375 ml  Net 500.07  ml   Filed Weights   07/06/2023 1145 07/12/23 1545  Weight: 68 kg 70.9 kg    Examination:  General exam: Appears calm and comfortable .  Chronically sick looking.  Frail and debilitated. Respiratory system: Clear to auscultation. Respiratory effort normal.  No added sounds. Cardiovascular system: S1 & S2 heard, tachycardic. Gastrointestinal system: Abdomen is nondistended, soft and nontender.  Bowel sound present. Central nervous system: Alert and oriented. No focal neurological deficits. Extremities: Symmetric 5 x 5 power.  Gross generalized weakness.    Data Reviewed: I have personally reviewed following labs and imaging studies  CBC: Recent Labs  Lab 07/07/23 1547 07/11/2023 1433 07/11/23 0519 07/12/23 0546 07/12/23 1118 07/13/23 0318 07/13/23 1154 07/14/23 0308  WBC 6.4 6.1 6.6 7.1 5.4 6.1  --  7.1  NEUTROABS 5.6 5.5  --   --  4.8  --   --  6.3  HGB 12.1* 10.8* 10.2* 8.8* 9.2* 6.4* 8.6* 7.8*  HCT 35.0* 32.0* 31.6* 26.6* 29.9* 20.0* 25.7* 23.4*  MCV 88.2 91.7 94.6 93.0 99.3 94.8  --  92.5  PLT 200 170 164 171 183 116*  --  72*   Basic Metabolic Panel: Recent Labs  Lab 07/11/23 0519 07/12/23 0546 07/12/23 1118 07/12/23 1557 07/13/23 0318 07/14/23 0308  NA 138 143 146*  --  146* 145  K 3.8 3.8 4.3  --  3.2* 3.0*  CL 99 105 110  --  111 111  CO2 25 23 13*  --  26 25  GLUCOSE 344* 428* 302*  --  193* 134*  BUN 19 43* 50*  --  39* 22  CREATININE 0.64 0.56* 0.89  --  0.52* 0.40*  CALCIUM 7.3* 7.5* 7.7*  --  7.1* 6.7*  MG  --   --   --   --   --  1.8  PHOS  --   --   --  1.2*  --  1.5*   GFR: Estimated Creatinine Clearance: 85.9 mL/min (A) (by C-G formula based on SCr of 0.4 mg/dL (L)). Liver Function Tests: Recent Labs  Lab 07/07/2023 1433 07/12/23 0546 07/12/23 1118 07/13/23 0318 07/14/23 0308  AST 50* 38 239* 3,101* 1,065*  ALT 90* 68* 352* 3,140* 2,337*  ALKPHOS 100 83 96 74 81  BILITOT 1.1 0.9 1.0 0.9 0.9  PROT 5.9* 4.9* 5.2* 4.5* 4.3*  ALBUMIN 2.9*  2.2* 2.5* 2.1* 2.0*   No results for input(s): "LIPASE", "AMYLASE" in the last 168 hours. Recent Labs  Lab 08/02/2023 1433  AMMONIA 22   Coagulation Profile: Recent Labs  Lab 07/26/2023 1433 07/12/23 1118  INR 1.1 1.5*   Cardiac Enzymes: Recent Labs  Lab 07/12/23 0546  CKTOTAL 93   BNP (last 3 results) No results for input(s): "PROBNP" in the last 8760 hours. HbA1C: No results for input(s): "HGBA1C" in the last 72 hours. CBG: Recent Labs  Lab 07/13/23 1159 07/13/23 1824 07/13/23 2154 07/13/23 2215 07/14/23 0742  GLUCAP 293* 138* 151* 130* 148*   Lipid Profile: No results for input(s): "CHOL", "HDL", "LDLCALC", "TRIG", "CHOLHDL", "LDLDIRECT" in the last 72 hours. Thyroid Function Tests: No results for input(s): "TSH", "T4TOTAL", "FREET4", "T3FREE", "THYROIDAB" in the last 72 hours.  Anemia Panel: No results for input(s): "VITAMINB12", "  FOLATE", "FERRITIN", "TIBC", "IRON", "RETICCTPCT" in the last 72 hours. Sepsis Labs: Recent Labs  Lab 07/12/23 1118 07/12/23 1215 07/12/23 1557 07/12/23 2149  LATICACIDVEN >9.0* >9.0* 8.1* 3.4*    Recent Results (from the past 240 hour(s))  Resp panel by RT-PCR (RSV, Flu A&B, Covid) Urine, Clean Catch     Status: None   Collection Time: 07/27/2023  1:30 PM   Specimen: Urine, Clean Catch; Nasal Swab  Result Value Ref Range Status   SARS Coronavirus 2 by RT PCR NEGATIVE NEGATIVE Final    Comment: (NOTE) SARS-CoV-2 target nucleic acids are NOT DETECTED.  The SARS-CoV-2 RNA is generally detectable in upper respiratory specimens during the acute phase of infection. The lowest concentration of SARS-CoV-2 viral copies this assay can detect is 138 copies/mL. A negative result does not preclude SARS-Cov-2 infection and should not be used as the sole basis for treatment or other patient management decisions. A negative result may occur with  improper specimen collection/handling, submission of specimen other than nasopharyngeal swab,  presence of viral mutation(s) within the areas targeted by this assay, and inadequate number of viral copies(<138 copies/mL). A negative result must be combined with clinical observations, patient history, and epidemiological information. The expected result is Negative.  Fact Sheet for Patients:  BloggerCourse.com  Fact Sheet for Healthcare Providers:  SeriousBroker.it  This test is no t yet approved or cleared by the [place] FDA and  has been authorized for detection and/or diagnosis of SARS-CoV-2 by FDA under an Emergency Use Authorization (EUA). This EUA will remain  in effect (meaning this test can be used) for the duration of the COVID-19 declaration under Section 564(b)(1) of the Act, 21 U.S.C.section 360bbb-3(b)(1), unless the authorization is terminated  or revoked sooner.       Influenza A by PCR NEGATIVE NEGATIVE Final   Influenza B by PCR NEGATIVE NEGATIVE Final    Comment: (NOTE) The Xpert Xpress SARS-CoV-2/FLU/RSV plus assay is intended as an aid in the diagnosis of influenza from Nasopharyngeal swab specimens and should not be used as a sole basis for treatment. Nasal washings and aspirates are unacceptable for Xpert Xpress SARS-CoV-2/FLU/RSV testing.  Fact Sheet for Patients: BloggerCourse.com  Fact Sheet for Healthcare Providers: SeriousBroker.it  This test is not yet approved or cleared by the [place] FDA and has been authorized for detection and/or diagnosis of SARS-CoV-2 by FDA under an Emergency Use Authorization (EUA). This EUA will remain in effect (meaning this test can be used) for the duration of the COVID-19 declaration under Section 564(b)(1) of the Act, 21 U.S.C. section 360bbb-3(b)(1), unless the authorization is terminated or revoked.     Resp Syncytial Virus by PCR NEGATIVE NEGATIVE Final    Comment: (NOTE) Fact Sheet for  Patients: BloggerCourse.com  Fact Sheet for Healthcare Providers: SeriousBroker.it  This test is not yet approved or cleared by the [place] FDA and has been authorized for detection and/or diagnosis of SARS-CoV-2 by FDA under an Emergency Use Authorization (EUA). This EUA will remain in effect (meaning this test can be used) for the duration of the COVID-19 declaration under Section 564(b)(1) of the Act, 21 U.S.C. section 360bbb-3(b)(1), unless the authorization is terminated or revoked.  Performed at [facility], [address] [place], [place] [address]   Urine Culture (for pregnant, neutropenic or urologic patients or patients with an indwelling urinary catheter)     Status: Abnormal   Collection Time: 07/11/2023  1:30 PM   Specimen: Urine, Clean Catch  Result Value Ref Range Status   Specimen Description   Final    URINE, CLEAN CATCH Performed at [facility], [address] [place], [place] [address]    Special Requests   Final    NONE Performed at [facility], [address] [place], [place] [address]    Culture >=100,000 COLONIES/mL KLEBSIELLA OXYTOCA (A)  Final   Report Status 07/13/2023 FINAL  Final   Organism ID, Bacteria KLEBSIELLA OXYTOCA (A)  Final      Susceptibility   Klebsiella oxytoca - MIC*    AMPICILLIN >=32 RESISTANT Resistant     CEFEPIME <=0.12 SENSITIVE Sensitive     CEFTRIAXONE <=0.25 SENSITIVE Sensitive     CIPROFLOXACIN <=0.25 SENSITIVE Sensitive     GENTAMICIN <=1 SENSITIVE Sensitive     IMIPENEM <=0.25 SENSITIVE Sensitive     NITROFURANTOIN 32 SENSITIVE Sensitive     TRIMETH/SULFA <=20 SENSITIVE Sensitive     AMPICILLIN/SULBACTAM 4 SENSITIVE Sensitive     PIP/TAZO <=4 SENSITIVE Sensitive ug/mL    * >=100,000 COLONIES/mL KLEBSIELLA OXYTOCA  Culture, blood (x 2)     Status: None (Preliminary result)   Collection Time: 07/12/23 11:18  AM   Specimen: BLOOD RIGHT HAND  Result Value Ref Range Status   Specimen Description   Final    BLOOD RIGHT HAND Performed at [facility], [address], [place], [place] [address]    Special Requests   Final    BOTTLES DRAWN AEROBIC AND ANAEROBIC Blood Culture adequate volume Performed at [facility], [address], [place], [place] [address]    Culture   Final    NO GROWTH < 24 HOURS Performed at [facility], [address] [place], [place] [address]    Report Status PENDING  Incomplete  Culture, blood (x 2)     Status: None (Preliminary result)   Collection Time: 07/12/23 11:19 AM   Specimen: BLOOD RIGHT ARM  Result Value Ref Range Status   Specimen Description   Final    BLOOD RIGHT ARM Performed at [facility], [address] [place], [place] [address]    Special Requests   Final    BOTTLES DRAWN AEROBIC AND ANAEROBIC Blood Culture adequate volume Performed at [facility], [address], [place], [place] [address]    Culture   Final    NO GROWTH < 24 HOURS Performed at [facility], [address], [place], [place] [address]    Report Status PENDING  Incomplete  MRSA Next Gen by PCR, Nasal     Status: None   Collection Time: 07/12/23  3:46 PM   Specimen: Nasal Mucosa; Nasal Swab  Result Value Ref Range Status   MRSA by PCR Next Gen NOT DETECTED NOT DETECTED Final    Comment: (NOTE) The GeneXpert MRSA Assay (FDA approved for NASAL specimens only), is one component of a comprehensive MRSA colonization surveillance program. It is not intended to diagnose MRSA infection nor to guide or monitor treatment for MRSA infections. Test performance is not FDA approved in patients less than 9 years old. Performed at [facility], [address] [place], [place] [address]          Radiology Studies: CT Angio Chest Pulmonary Embolism (PE) W or WO Contrast  Result Date:  07/12/2023 CLINICAL DATA:  Abdomen pain fatigue EXAM: CT ANGIOGRAPHY CHEST CT ABDOMEN AND PELVIS WITH CONTRAST TECHNIQUE: Multidetector CT imaging  of the chest was performed using the standard protocol during bolus administration of intravenous contrast. Multiplanar CT image reconstructions and MIPs were obtained to evaluate the vascular anatomy. Multidetector CT imaging of the abdomen and pelvis was performed using the standard protocol during bolus administration of intravenous contrast. RADIATION DOSE REDUCTION: This exam was performed according to the departmental dose-optimization program which includes automated exposure control, adjustment of the mA and/or kV according to patient size and/or use of iterative reconstruction technique. CONTRAST:  OMNIPAQUE IOHEXOL 350 MG/ML SOLN COMPARISON:  Chest x-ray 07/11/2023, CT 06/30/2023 FINDINGS: CTA CHEST FINDINGS Cardiovascular: Satisfactory opacification of the pulmonary arteries to the segmental level. No evidence of pulmonary embolism. Mild aortic atherosclerosis. No aneurysm. No dissection. Normal cardiac size. No pericardial effusion Mediastinum/Nodes: Midline trachea. No thyroid mass. No suspicious lymph nodes. Esophagus within normal limits. Lungs/Pleura: Mild emphysema. No pleural effusion or pneumothorax. Interim development of multifocal bilateral heterogeneous ground-glass densities. Musculoskeletal: Sternum appears intact. Mild sclerosis in the T10 vertebral body on the left side, corresponding to recent bone scan finding and suspect for skeletal osseous metastatic disease Review of the MIP images confirms the above findings. CT ABDOMEN and PELVIS FINDINGS Hepatobiliary: Innumerable hypodense liver masses consistent with metastatic disease. No calcified gallstone. No biliary dilatation Pancreas: No ductal dilatation. New small volume fluid surrounding the distal body and tail of pancreas. Spleen: Normal in size without focal abnormality.  Adrenals/Urinary Tract: Bilateral adrenal masses consistent with metastatic disease. Kidneys show no hydronephrosis. 7 mm soft tissue nodule in the left perirenal space on series 2, image 32, appears separate from kidney and difficult to exclude small metastatic focus. Punctate nonobstructing right kidney stone. Urinary bladder is unremarkable Stomach/Bowel: The stomach is nonenlarged. Mild eccentric wall thickening at the posterior fundus. No small bowel distension. Fluid in the colon. Negative appendix Vascular/Lymphatic: Moderate aortic atherosclerosis. No aneurysm. No suspicious lymph nodes. Reproductive: Negative prostate other than fiducial markers Other: No free air Musculoskeletal: No acute osseous abnormality Review of the MIP images confirms the above findings. IMPRESSION: 1. Negative for acute pulmonary embolus. 2. Interim development of multifocal bilateral heterogeneous ground-glass densities, suspect for multifocal pneumonia, possible atypical/viral organism. 3. Innumerable liver masses consistent with metastatic disease or multifocal liver malignancy. Bilateral adrenal masses consistent with metastatic disease. 4. Mild eccentric wall thickening at the posterior fundus of stomach, could be correlated with endoscopy as indicated 5. New small volume fluid surrounding the distal body and tail of pancreas, findings could be secondary to pancreatitis, recommend correlation with appropriate laboratory studies. 6. 7 mm soft tissue nodule in the left perirenal space appears separate from the kidney and difficult to exclude small metastatic focus. 7. Minimal sclerosis at the T10 vertebral body suspicious for metastatic bone lesion Aortic Atherosclerosis (ICD10-I70.0) and Emphysema (ICD10-J43.9). Electronically Signed   By: Jasmine Pang M.D.   On: 07/12/2023 18:55   CT ABDOMEN PELVIS W CONTRAST  Result Date: 07/12/2023 CLINICAL DATA:  Abdomen pain fatigue EXAM: CT ANGIOGRAPHY CHEST CT ABDOMEN AND PELVIS  WITH CONTRAST TECHNIQUE: Multidetector CT imaging of the chest was performed using the standard protocol during bolus administration of intravenous contrast. Multiplanar CT image reconstructions and MIPs were obtained to evaluate the vascular anatomy. Multidetector CT imaging of the abdomen and pelvis was performed using the standard protocol during bolus administration of intravenous contrast. RADIATION DOSE REDUCTION: This exam was performed according to the departmental dose-optimization program which includes automated exposure control, adjustment of the mA and/or kV according to patient size and/or use of iterative  reconstruction technique. CONTRAST:  OMNIPAQUE IOHEXOL 350 MG/ML SOLN COMPARISON:  Chest x-ray 07/31/2023, CT 06/30/2023 FINDINGS: CTA CHEST FINDINGS Cardiovascular: Satisfactory opacification of the pulmonary arteries to the segmental level. No evidence of pulmonary embolism. Mild aortic atherosclerosis. No aneurysm. No dissection. Normal cardiac size. No pericardial effusion Mediastinum/Nodes: Midline trachea. No thyroid mass. No suspicious lymph nodes. Esophagus within normal limits. Lungs/Pleura: Mild emphysema. No pleural effusion or pneumothorax. Interim development of multifocal bilateral heterogeneous ground-glass densities. Musculoskeletal: Sternum appears intact. Mild sclerosis in the T10 vertebral body on the left side, corresponding to recent bone scan finding and suspect for skeletal osseous metastatic disease Review of the MIP images confirms the above findings. CT ABDOMEN and PELVIS FINDINGS Hepatobiliary: Innumerable hypodense liver masses consistent with metastatic disease. No calcified gallstone. No biliary dilatation Pancreas: No ductal dilatation. New small volume fluid surrounding the distal body and tail of pancreas. Spleen: Normal in size without focal abnormality. Adrenals/Urinary Tract: Bilateral adrenal masses consistent with metastatic disease. Kidneys show no  hydronephrosis. 7 mm soft tissue nodule in the left perirenal space on series 2, image 32, appears separate from kidney and difficult to exclude small metastatic focus. Punctate nonobstructing right kidney stone. Urinary bladder is unremarkable Stomach/Bowel: The stomach is nonenlarged. Mild eccentric wall thickening at the posterior fundus. No small bowel distension. Fluid in the colon. Negative appendix Vascular/Lymphatic: Moderate aortic atherosclerosis. No aneurysm. No suspicious lymph nodes. Reproductive: Negative prostate other than fiducial markers Other: No free air Musculoskeletal: No acute osseous abnormality Review of the MIP images confirms the above findings. IMPRESSION: 1. Negative for acute pulmonary embolus. 2. Interim development of multifocal bilateral heterogeneous ground-glass densities, suspect for multifocal pneumonia, possible atypical/viral organism. 3. Innumerable liver masses consistent with metastatic disease or multifocal liver malignancy. Bilateral adrenal masses consistent with metastatic disease. 4. Mild eccentric wall thickening at the posterior fundus of stomach, could be correlated with endoscopy as indicated 5. New small volume fluid surrounding the distal body and tail of pancreas, findings could be secondary to pancreatitis, recommend correlation with appropriate laboratory studies. 6. 7 mm soft tissue nodule in the left perirenal space appears separate from the kidney and difficult to exclude small metastatic focus. 7. Minimal sclerosis at the T10 vertebral body suspicious for metastatic bone lesion Aortic Atherosclerosis (ICD10-I70.0) and Emphysema (ICD10-J43.9). Electronically Signed   By: Jasmine Pang M.D.   On: 07/12/2023 18:55        Scheduled Meds:  sodium chloride   Intravenous Once   CARBOplatin  470 mg Intravenous Once   Chlorhexidine Gluconate Cloth  6 each Topical Daily   docusate sodium  100 mg Oral BID   dorzolamide  1 drop Right Eye BID   etoposide  80  mg/m2 (Treatment Plan Recorded) Intravenous Once   feeding supplement (GLUCERNA SHAKE)  237 mL Oral BID BM   hydrocortisone sod succinate (SOLU-CORTEF) inj  50 mg Intravenous Q12H   insulin aspart  0-15 Units Subcutaneous TID WC   insulin aspart  0-5 Units Subcutaneous QHS   insulin glargine-yfgn  12 Units Subcutaneous Daily   palonosetron  0.25 mg Intravenous Once   pantoprazole (PROTONIX) IV  40 mg Intravenous Q12H   sodium chloride flush  3 mL Intravenous Q12H   timolol  1 drop Right Eye BID   Continuous Infusions:  ampicillin-sulbactam (UNASYN) IV     dexamethasone (DECADRON) IVPB ([facility])     fosaprepitant (EMEND) 150 mg in sodium chloride 0.9 % 145 mL IVPB     potassium PHOSPHATE IVPB (  in mmol) 30 mmol (07/14/23 0553)     LOS: 2 days    Time spent: 50 minutes    Dorcas Carrow, MD [facility]

## 2023-07-14 NOTE — Consult Note (Signed)
Reason for Consult: Melena, anemia, and elevated liver enzymes Referring Physician: [facility]  [place] HPI: This is a 72 year old male with a PMH of prostate cancer, recent diagnosis of metastatic small cell carcinoma of the liver, HTN, hyperlipidemia, and RAVE (radiation proctitis) admitted for weakness.  The patient was recently hospitalized in September for complaints of melenic stools and anemia.  At that time his HGB was at 5.8 g/dL.  Dr. Adela Lank performed an EGD with findings of a Forrest III gastric ulcer.  The gastric biopsies were negative for H pylori.  He was also diagnosed with small cell carcinoma of the liver with a needle biopsy.  While at home he felt weak and he presented to the ER.  There were no specific findings for his weakness and there was the possibility of an UTI and/or weakness from [place].  He was taking his pantoprazole for the gastric ulcer and his HGB was relatively stable.  During this admission, on 07/12/2023 his BP dropped and he also had melena.  The HGB dropped down to 6.4 g/dL on 40/06/8118.  Per his report he had two episodes of melena.  Currently his HGB is relatively stable at 7.8 g/dL.  He denies any abdominal pain, nausea, vomiting, or hematemesis.  The patient was also identified to have a marked elevation in his liver enzymes.  This correlated with his hypotension and the values are improving.  The patient was treated with antibiotics for the possibility of sepsis. Past Medical History:  Diagnosis Date   Angiodysplasia of colon with hemorrhage 10/14/2020   Duodenitis    Essential hypertension 07/16/2015   Fatigue 11/06/2020   Glaucoma    H/O: substance abuse (HCC)    Hemorrhage of rectum and anus 10/14/2020   History of frostbite    History of prostate cancer 03/20/2014   Lost to f/u with [facility] in the past 2011 with elevated PSA >30 at that time.  Saw [facility] (Dr. Isabel Caprice) on 03/13/14. Cancer Noted 03/13/14 office  visit. Gleason score 7. Plan CT Ab/pelvis with contrast, bone scan    Hyperlipidemia 11/20/2019   Iron deficiency anemia    Malignant tumor of prostate (HCC) 10/14/2020   Peripheral neuropathy    S/P radiation therapy 09/18/14-11/15/14   prostate/ 7800Gy/40sessions   Thrombocytopenia [facility])     Past Surgical History:  Procedure Laterality Date   BIOPSY  06/11/2023   Procedure: BIOPSY;  Surgeon: Benancio Deeds, MD;  Location: [facility] ENDOSCOPY;  Service: Gastroenterology;;   COLONOSCOPY N/A 01/10/2014   Procedure: COLONOSCOPY;  Surgeon: Theda Belfast, MD;  Location: [facility] ENDOSCOPY;  Service: Endoscopy;  Laterality: N/A;   ESOPHAGOGASTRODUODENOSCOPY N/A 01/10/2014   Procedure: ESOPHAGOGASTRODUODENOSCOPY (EGD);  Surgeon: Theda Belfast, MD;  Location: [facility] ENDOSCOPY;  Service: Endoscopy;  Laterality: N/A;   ESOPHAGOGASTRODUODENOSCOPY (EGD) WITH PROPOFOL N/A 06/11/2023   Procedure: ESOPHAGOGASTRODUODENOSCOPY (EGD) WITH PROPOFOL;  Surgeon: Benancio Deeds, MD;  Location: [facility] ENDOSCOPY;  Service: Gastroenterology;  Laterality: N/A;   FLEXIBLE SIGMOIDOSCOPY N/A 09/19/2015   Procedure: FLEXIBLE SIGMOIDOSCOPY;  Surgeon: Jeani Hawking, MD;  Location: [facility] ENDOSCOPY;  Service: Endoscopy;  Laterality: N/A;   FLEXIBLE SIGMOIDOSCOPY N/A 06/03/2017   Procedure: FLEXIBLE SIGMOIDOSCOPY;  Surgeon: Jeani Hawking, MD;  Location: [facility] ENDOSCOPY;  Service: Endoscopy;  Laterality: N/A;   FLEXIBLE SIGMOIDOSCOPY N/A 01/06/2018   Procedure: FLEXIBLE SIGMOIDOSCOPY;  Surgeon: Jeani Hawking, MD;  Location: [facility] ENDOSCOPY;  Service: Endoscopy;  Laterality: N/A;   GIVENS CAPSULE STUDY N/A 01/10/2014   Procedure: GIVENS  CAPSULE STUDY;  Surgeon: Theda Belfast, MD;  Location: [facility] ENDOSCOPY;  Service: Endoscopy;  Laterality: N/A;   HOT HEMOSTASIS N/A 09/19/2015   Procedure: HOT HEMOSTASIS (ARGON PLASMA COAGULATION/BICAP);  Surgeon: Jeani Hawking, MD;  Location: [facility] ENDOSCOPY;  Service: Endoscopy;  Laterality: N/A;   HOT HEMOSTASIS N/A  06/03/2017   Procedure: HOT HEMOSTASIS (ARGON PLASMA COAGULATION/BICAP);  Surgeon: Jeani Hawking, MD;  Location: [facility] ENDOSCOPY;  Service: Endoscopy;  Laterality: N/A;   PROSTATE BIOPSY  03/05/14   Gleason 7, vol 45 gm    Family History  Problem Relation Age of Onset   Kidney failure Brother 42       has been on HD since age 51    Edema Mother        Legs   Arthritis Sister        knees   Stroke Maternal Uncle        70s-80s   Cancer Neg Hx     Social History:  reports that he quit smoking about 15 years ago. His smoking use included cigarettes. He started smoking about 45 years ago. He has a 15 pack-year smoking history. He has never used smokeless tobacco. He reports that he does not currently use alcohol. He reports that he does not use drugs.  Allergies:  Allergies  Allergen Reactions   Metformin And Related Other (See Comments)    Bad headaches    Medications: Scheduled:  sodium chloride   Intravenous Once   CARBOplatin  470 mg Intravenous Once   Chlorhexidine Gluconate Cloth  6 each Topical Daily   docusate sodium  100 mg Oral BID   dorzolamide  1 drop Right Eye BID   etoposide  80 mg/m2 (Treatment Plan Recorded) Intravenous Once   feeding supplement (GLUCERNA SHAKE)  237 mL Oral BID BM   hydrocortisone sod succinate (SOLU-CORTEF) inj  50 mg Intravenous Q12H   insulin aspart  0-15 Units Subcutaneous TID WC   insulin aspart  0-5 Units Subcutaneous QHS   insulin glargine-yfgn  12 Units Subcutaneous Daily   mouth rinse  15 mL Mouth Rinse 4 times per day   palonosetron  0.25 mg Intravenous Once   pantoprazole (PROTONIX) IV  40 mg Intravenous Q12H   sodium chloride flush  3 mL Intravenous Q12H   timolol  1 drop Right Eye BID   Continuous:  ampicillin-sulbactam (UNASYN) IV Stopped (07/14/23 1234)   dexamethasone (DECADRON) IVPB ([facility])     fosaprepitant (EMEND) 150 mg in sodium chloride 0.9 % 145 mL IVPB      Results for orders placed or performed during the hospital  encounter of 07/10/23 (from the past 24 hour(s))  Glucose, capillary     Status: Abnormal   Collection Time: 07/13/23  6:24 PM  Result Value Ref Range   Glucose-Capillary 138 (H) 70 - 99 mg/dL   Comment 1 Notify RN    Comment 2 Document in Chart   Glucose, capillary     Status: Abnormal   Collection Time: 07/13/23  9:54 PM  Result Value Ref Range   Glucose-Capillary 151 (H) 70 - 99 mg/dL  Glucose, capillary     Status: Abnormal   Collection Time: 07/13/23 10:15 PM  Result Value Ref Range   Glucose-Capillary 130 (H) 70 - 99 mg/dL   Comment 1 Notify RN    Comment 2 Document in Chart   CBC with Differential/Platelet     Status: Abnormal   Collection Time: 07/14/23  3:08 AM  Result Value  Ref Range   WBC 7.1 4.0 - 10.5 K/uL   RBC 2.53 (L) 4.22 - 5.81 MIL/uL   Hemoglobin 7.8 (L) 13.0 - 17.0 g/dL   HCT 16.1 (L) 09.6 - 04.5 %   MCV 92.5 80.0 - 100.0 fL   MCH 30.8 26.0 - 34.0 pg   MCHC 33.3 30.0 - 36.0 g/dL   RDW 40.9 (H) 81.1 - 91.4 %   Platelets 72 (L) 150 - 400 K/uL   nRBC 1.0 (H) 0.0 - 0.2 %   Neutrophils Relative % 89 %   Neutro Abs 6.3 1.7 - 7.7 K/uL   Lymphocytes Relative 5 %   Lymphs Abs 0.4 (L) 0.7 - 4.0 K/uL   Monocytes Relative 5 %   Monocytes Absolute 0.3 0.1 - 1.0 K/uL   Eosinophils Relative 0 %   Eosinophils Absolute 0.0 0.0 - 0.5 K/uL   Basophils Relative 0 %   Basophils Absolute 0.0 0.0 - 0.1 K/uL   Immature Granulocytes 1 %   Abs Immature Granulocytes 0.09 (H) 0.00 - 0.07 K/uL  Comprehensive metabolic panel     Status: Abnormal   Collection Time: 07/14/23  3:08 AM  Result Value Ref Range   Sodium 145 135 - 145 mmol/L   Potassium 3.0 (L) 3.5 - 5.1 mmol/L   Chloride 111 98 - 111 mmol/L   CO2 25 22 - 32 mmol/L   Glucose, Bld 134 (H) 70 - 99 mg/dL   BUN 22 8 - 23 mg/dL   Creatinine, Ser 7.82 (L) 0.61 - 1.24 mg/dL   Calcium 6.7 (L) 8.9 - 10.3 mg/dL   Total Protein 4.3 (L) 6.5 - 8.1 g/dL   Albumin 2.0 (L) 3.5 - 5.0 g/dL   AST 9,562 (H) 15 - 41 U/L   ALT 2,337  (H) 0 - 44 U/L   Alkaline Phosphatase 81 38 - 126 U/L   Total Bilirubin 0.9 0.3 - 1.2 mg/dL   GFR, Estimated >13 >08 mL/min   Anion gap 9 5 - 15  Magnesium     Status: None   Collection Time: 07/14/23  3:08 AM  Result Value Ref Range   Magnesium 1.8 1.7 - 2.4 mg/dL  Phosphorus     Status: Abnormal   Collection Time: 07/14/23  3:08 AM  Result Value Ref Range   Phosphorus 1.5 (L) 2.5 - 4.6 mg/dL  Glucose, capillary     Status: Abnormal   Collection Time: 07/14/23  7:42 AM  Result Value Ref Range   Glucose-Capillary 148 (H) 70 - 99 mg/dL  Glucose, capillary     Status: Abnormal   Collection Time: 07/14/23 11:48 AM  Result Value Ref Range   Glucose-Capillary 247 (H) 70 - 99 mg/dL     CT Angio Chest Pulmonary Embolism (PE) W or WO Contrast  Result Date: 07/12/2023 CLINICAL DATA:  Abdomen pain fatigue EXAM: CT ANGIOGRAPHY CHEST CT ABDOMEN AND PELVIS WITH CONTRAST TECHNIQUE: Multidetector CT imaging of the chest was performed using the standard protocol during bolus administration of intravenous contrast. Multiplanar CT image reconstructions and MIPs were obtained to evaluate the vascular anatomy. Multidetector CT imaging of the abdomen and pelvis was performed using the standard protocol during bolus administration of intravenous contrast. RADIATION DOSE REDUCTION: This exam was performed according to the departmental dose-optimization program which includes automated exposure control, adjustment of the mA and/or kV according to patient size and/or use of iterative reconstruction technique. CONTRAST:  OMNIPAQUE IOHEXOL 350 MG/ML SOLN COMPARISON:  Chest x-ray 07/10/2023, CT  06/30/2023 FINDINGS: CTA CHEST FINDINGS Cardiovascular: Satisfactory opacification of the pulmonary arteries to the segmental level. No evidence of pulmonary embolism. Mild aortic atherosclerosis. No aneurysm. No dissection. Normal cardiac size. No pericardial effusion Mediastinum/Nodes: Midline trachea. No thyroid mass.  No suspicious lymph nodes. Esophagus within normal limits. Lungs/Pleura: Mild emphysema. No pleural effusion or pneumothorax. Interim development of multifocal bilateral heterogeneous ground-glass densities. Musculoskeletal: Sternum appears intact. Mild sclerosis in the T10 vertebral body on the left side, corresponding to recent bone scan finding and suspect for skeletal osseous metastatic disease Review of the MIP images confirms the above findings. CT ABDOMEN and PELVIS FINDINGS Hepatobiliary: Innumerable hypodense liver masses consistent with metastatic disease. No calcified gallstone. No biliary dilatation Pancreas: No ductal dilatation. New small volume fluid surrounding the distal body and tail of pancreas. Spleen: Normal in size without focal abnormality. Adrenals/Urinary Tract: Bilateral adrenal masses consistent with metastatic disease. Kidneys show no hydronephrosis. 7 mm soft tissue nodule in the left perirenal space on series 2, image 32, appears separate from kidney and difficult to exclude small metastatic focus. Punctate nonobstructing right kidney stone. Urinary bladder is unremarkable Stomach/Bowel: The stomach is nonenlarged. Mild eccentric wall thickening at the posterior fundus. No small bowel distension. Fluid in the colon. Negative appendix Vascular/Lymphatic: Moderate aortic atherosclerosis. No aneurysm. No suspicious lymph nodes. Reproductive: Negative prostate other than fiducial markers Other: No free air Musculoskeletal: No acute osseous abnormality Review of the MIP images confirms the above findings. IMPRESSION: 1. Negative for acute pulmonary embolus. 2. Interim development of multifocal bilateral heterogeneous ground-glass densities, suspect for multifocal pneumonia, possible atypical/viral organism. 3. Innumerable liver masses consistent with metastatic disease or multifocal liver malignancy. Bilateral adrenal masses consistent with metastatic disease. 4. Mild eccentric wall  thickening at the posterior fundus of stomach, could be correlated with endoscopy as indicated 5. New small volume fluid surrounding the distal body and tail of pancreas, findings could be secondary to pancreatitis, recommend correlation with appropriate laboratory studies. 6. 7 mm soft tissue nodule in the left perirenal space appears separate from the kidney and difficult to exclude small metastatic focus. 7. Minimal sclerosis at the T10 vertebral body suspicious for metastatic bone lesion Aortic Atherosclerosis (ICD10-I70.0) and Emphysema (ICD10-J43.9). Electronically Signed   By: Jasmine Pang M.D.   On: 07/12/2023 18:55   CT ABDOMEN PELVIS W CONTRAST  Result Date: 07/12/2023 CLINICAL DATA:  Abdomen pain fatigue EXAM: CT ANGIOGRAPHY CHEST CT ABDOMEN AND PELVIS WITH CONTRAST TECHNIQUE: Multidetector CT imaging of the chest was performed using the standard protocol during bolus administration of intravenous contrast. Multiplanar CT image reconstructions and MIPs were obtained to evaluate the vascular anatomy. Multidetector CT imaging of the abdomen and pelvis was performed using the standard protocol during bolus administration of intravenous contrast. RADIATION DOSE REDUCTION: This exam was performed according to the departmental dose-optimization program which includes automated exposure control, adjustment of the mA and/or kV according to patient size and/or use of iterative reconstruction technique. CONTRAST:  OMNIPAQUE IOHEXOL 350 MG/ML SOLN COMPARISON:  Chest x-ray 07/10/2023, CT 06/30/2023 FINDINGS: CTA CHEST FINDINGS Cardiovascular: Satisfactory opacification of the pulmonary arteries to the segmental level. No evidence of pulmonary embolism. Mild aortic atherosclerosis. No aneurysm. No dissection. Normal cardiac size. No pericardial effusion Mediastinum/Nodes: Midline trachea. No thyroid mass. No suspicious lymph nodes. Esophagus within normal limits. Lungs/Pleura: Mild emphysema. No pleural  effusion or pneumothorax. Interim development of multifocal bilateral heterogeneous ground-glass densities. Musculoskeletal: Sternum appears intact. Mild sclerosis in the T10 vertebral body on the left side,  corresponding to recent bone scan finding and suspect for skeletal osseous metastatic disease Review of the MIP images confirms the above findings. CT ABDOMEN and PELVIS FINDINGS Hepatobiliary: Innumerable hypodense liver masses consistent with metastatic disease. No calcified gallstone. No biliary dilatation Pancreas: No ductal dilatation. New small volume fluid surrounding the distal body and tail of pancreas. Spleen: Normal in size without focal abnormality. Adrenals/Urinary Tract: Bilateral adrenal masses consistent with metastatic disease. Kidneys show no hydronephrosis. 7 mm soft tissue nodule in the left perirenal space on series 2, image 32, appears separate from kidney and difficult to exclude small metastatic focus. Punctate nonobstructing right kidney stone. Urinary bladder is unremarkable Stomach/Bowel: The stomach is nonenlarged. Mild eccentric wall thickening at the posterior fundus. No small bowel distension. Fluid in the colon. Negative appendix Vascular/Lymphatic: Moderate aortic atherosclerosis. No aneurysm. No suspicious lymph nodes. Reproductive: Negative prostate other than fiducial markers Other: No free air Musculoskeletal: No acute osseous abnormality Review of the MIP images confirms the above findings. IMPRESSION: 1. Negative for acute pulmonary embolus. 2. Interim development of multifocal bilateral heterogeneous ground-glass densities, suspect for multifocal pneumonia, possible atypical/viral organism. 3. Innumerable liver masses consistent with metastatic disease or multifocal liver malignancy. Bilateral adrenal masses consistent with metastatic disease. 4. Mild eccentric wall thickening at the posterior fundus of stomach, could be correlated with endoscopy as indicated 5. New small  volume fluid surrounding the distal body and tail of pancreas, findings could be secondary to pancreatitis, recommend correlation with appropriate laboratory studies. 6. 7 mm soft tissue nodule in the left perirenal space appears separate from the kidney and difficult to exclude small metastatic focus. 7. Minimal sclerosis at the T10 vertebral body suspicious for metastatic bone lesion Aortic Atherosclerosis (ICD10-I70.0) and Emphysema (ICD10-J43.9). Electronically Signed   By: Jasmine Pang M.D.   On: 07/12/2023 18:55    ROS:  As stated above in the HPI otherwise negative.  Blood pressure (!) 143/87, pulse 99, temperature 97.9 F (36.6 C), temperature source Oral, resp. rate 19, height 5\' 9"  (1.753 m), weight 70.9 kg, SpO2 96%.    PE: Gen: NAD, Alert and Oriented HEENT:  [place]/AT, EOMI Neck: Supple, no LAD Lungs: CTA Bilaterally CV: RRR without M/G/R ABD: Soft, NTND, +BS Ext: No C/C/E  Assessment/Plan: 1) Anemia. 2) Melena. 3) Metastatic small cell carcinoma. 4) Shock liver.   The patient is currently stable.  His BP improved and it is back to his moderate hypertensive state.  Since he had a recent gastric ulcer and his HGB declined, further evaluation with a repeat EGD is in order.  Plan: 1) EGD tomorrow. 2) Follow HGB and transfuse as necessary. 3) Continue to follow liver enzymes  Selena Swaminathan D 07/14/2023, 5:05 PM

## 2023-07-14 NOTE — H&P (View-Only) (Signed)
Reason for Consult: Melena, anemia, and elevated liver enzymes Referring Physician: [facility]  [place] HPI: This is a 72 year old male with a PMH of prostate cancer, recent diagnosis of metastatic small cell carcinoma of the liver, HTN, hyperlipidemia, and RAVE (radiation proctitis) admitted for weakness.  The patient was recently hospitalized in September for complaints of melenic stools and anemia.  At that time his HGB was at 5.8 g/dL.  Dr. Adela Lank performed an EGD with findings of a Forrest III gastric ulcer.  The gastric biopsies were negative for H pylori.  He was also diagnosed with small cell carcinoma of the liver with a needle biopsy.  While at home he felt weak and he presented to the ER.  There were no specific findings for his weakness and there was the possibility of an UTI and/or weakness from [place].  He was taking his pantoprazole for the gastric ulcer and his HGB was relatively stable.  During this admission, on 07/12/2023 his BP dropped and he also had melena.  The HGB dropped down to 6.4 g/dL on 40/06/8118.  Per his report he had two episodes of melena.  Currently his HGB is relatively stable at 7.8 g/dL.  He denies any abdominal pain, nausea, vomiting, or hematemesis.  The patient was also identified to have a marked elevation in his liver enzymes.  This correlated with his hypotension and the values are improving.  The patient was treated with antibiotics for the possibility of sepsis. Past Medical History:  Diagnosis Date   Angiodysplasia of colon with hemorrhage 10/14/2020   Duodenitis    Essential hypertension 07/16/2015   Fatigue 11/06/2020   Glaucoma    H/O: substance abuse (HCC)    Hemorrhage of rectum and anus 10/14/2020   History of frostbite    History of prostate cancer 03/20/2014   Lost to f/u with [facility] in the past 2011 with elevated PSA >30 at that time.  Saw [facility] (Dr. Isabel Caprice) on 03/13/14. Cancer Noted 03/13/14 office  visit. Gleason score 7. Plan CT Ab/pelvis with contrast, bone scan    Hyperlipidemia 11/20/2019   Iron deficiency anemia    Malignant tumor of prostate (HCC) 10/14/2020   Peripheral neuropathy    S/P radiation therapy 09/18/14-11/15/14   prostate/ 7800Gy/40sessions   Thrombocytopenia [facility])     Past Surgical History:  Procedure Laterality Date   BIOPSY  06/11/2023   Procedure: BIOPSY;  Surgeon: Benancio Deeds, MD;  Location: [facility] ENDOSCOPY;  Service: Gastroenterology;;   COLONOSCOPY N/A 01/10/2014   Procedure: COLONOSCOPY;  Surgeon: Theda Belfast, MD;  Location: [facility] ENDOSCOPY;  Service: Endoscopy;  Laterality: N/A;   ESOPHAGOGASTRODUODENOSCOPY N/A 01/10/2014   Procedure: ESOPHAGOGASTRODUODENOSCOPY (EGD);  Surgeon: Theda Belfast, MD;  Location: [facility] ENDOSCOPY;  Service: Endoscopy;  Laterality: N/A;   ESOPHAGOGASTRODUODENOSCOPY (EGD) WITH PROPOFOL N/A 06/11/2023   Procedure: ESOPHAGOGASTRODUODENOSCOPY (EGD) WITH PROPOFOL;  Surgeon: Benancio Deeds, MD;  Location: [facility] ENDOSCOPY;  Service: Gastroenterology;  Laterality: N/A;   FLEXIBLE SIGMOIDOSCOPY N/A 09/19/2015   Procedure: FLEXIBLE SIGMOIDOSCOPY;  Surgeon: Jeani Hawking, MD;  Location: [facility] ENDOSCOPY;  Service: Endoscopy;  Laterality: N/A;   FLEXIBLE SIGMOIDOSCOPY N/A 06/03/2017   Procedure: FLEXIBLE SIGMOIDOSCOPY;  Surgeon: Jeani Hawking, MD;  Location: [facility] ENDOSCOPY;  Service: Endoscopy;  Laterality: N/A;   FLEXIBLE SIGMOIDOSCOPY N/A 01/06/2018   Procedure: FLEXIBLE SIGMOIDOSCOPY;  Surgeon: Jeani Hawking, MD;  Location: [facility] ENDOSCOPY;  Service: Endoscopy;  Laterality: N/A;   GIVENS CAPSULE STUDY N/A 01/10/2014   Procedure: GIVENS  CAPSULE STUDY;  Surgeon: Theda Belfast, MD;  Location: [facility] ENDOSCOPY;  Service: Endoscopy;  Laterality: N/A;   HOT HEMOSTASIS N/A 09/19/2015   Procedure: HOT HEMOSTASIS (ARGON PLASMA COAGULATION/BICAP);  Surgeon: Jeani Hawking, MD;  Location: [facility] ENDOSCOPY;  Service: Endoscopy;  Laterality: N/A;   HOT HEMOSTASIS N/A  06/03/2017   Procedure: HOT HEMOSTASIS (ARGON PLASMA COAGULATION/BICAP);  Surgeon: Jeani Hawking, MD;  Location: [facility] ENDOSCOPY;  Service: Endoscopy;  Laterality: N/A;   PROSTATE BIOPSY  03/05/14   Gleason 7, vol 45 gm    Family History  Problem Relation Age of Onset   Kidney failure Brother 42       has been on HD since age 51    Edema Mother        Legs   Arthritis Sister        knees   Stroke Maternal Uncle        70s-80s   Cancer Neg Hx     Social History:  reports that he quit smoking about 15 years ago. His smoking use included cigarettes. He started smoking about 45 years ago. He has a 15 pack-year smoking history. He has never used smokeless tobacco. He reports that he does not currently use alcohol. He reports that he does not use drugs.  Allergies:  Allergies  Allergen Reactions   Metformin And Related Other (See Comments)    Bad headaches    Medications: Scheduled:  sodium chloride   Intravenous Once   CARBOplatin  470 mg Intravenous Once   Chlorhexidine Gluconate Cloth  6 each Topical Daily   docusate sodium  100 mg Oral BID   dorzolamide  1 drop Right Eye BID   etoposide  80 mg/m2 (Treatment Plan Recorded) Intravenous Once   feeding supplement (GLUCERNA SHAKE)  237 mL Oral BID BM   hydrocortisone sod succinate (SOLU-CORTEF) inj  50 mg Intravenous Q12H   insulin aspart  0-15 Units Subcutaneous TID WC   insulin aspart  0-5 Units Subcutaneous QHS   insulin glargine-yfgn  12 Units Subcutaneous Daily   mouth rinse  15 mL Mouth Rinse 4 times per day   palonosetron  0.25 mg Intravenous Once   pantoprazole (PROTONIX) IV  40 mg Intravenous Q12H   sodium chloride flush  3 mL Intravenous Q12H   timolol  1 drop Right Eye BID   Continuous:  ampicillin-sulbactam (UNASYN) IV Stopped (07/14/23 1234)   dexamethasone (DECADRON) IVPB ([facility])     fosaprepitant (EMEND) 150 mg in sodium chloride 0.9 % 145 mL IVPB      Results for orders placed or performed during the hospital  encounter of 07/10/23 (from the past 24 hour(s))  Glucose, capillary     Status: Abnormal   Collection Time: 07/13/23  6:24 PM  Result Value Ref Range   Glucose-Capillary 138 (H) 70 - 99 mg/dL   Comment 1 Notify RN    Comment 2 Document in Chart   Glucose, capillary     Status: Abnormal   Collection Time: 07/13/23  9:54 PM  Result Value Ref Range   Glucose-Capillary 151 (H) 70 - 99 mg/dL  Glucose, capillary     Status: Abnormal   Collection Time: 07/13/23 10:15 PM  Result Value Ref Range   Glucose-Capillary 130 (H) 70 - 99 mg/dL   Comment 1 Notify RN    Comment 2 Document in Chart   CBC with Differential/Platelet     Status: Abnormal   Collection Time: 07/14/23  3:08 AM  Result Value  Ref Range   WBC 7.1 4.0 - 10.5 K/uL   RBC 2.53 (L) 4.22 - 5.81 MIL/uL   Hemoglobin 7.8 (L) 13.0 - 17.0 g/dL   HCT 16.1 (L) 09.6 - 04.5 %   MCV 92.5 80.0 - 100.0 fL   MCH 30.8 26.0 - 34.0 pg   MCHC 33.3 30.0 - 36.0 g/dL   RDW 40.9 (H) 81.1 - 91.4 %   Platelets 72 (L) 150 - 400 K/uL   nRBC 1.0 (H) 0.0 - 0.2 %   Neutrophils Relative % 89 %   Neutro Abs 6.3 1.7 - 7.7 K/uL   Lymphocytes Relative 5 %   Lymphs Abs 0.4 (L) 0.7 - 4.0 K/uL   Monocytes Relative 5 %   Monocytes Absolute 0.3 0.1 - 1.0 K/uL   Eosinophils Relative 0 %   Eosinophils Absolute 0.0 0.0 - 0.5 K/uL   Basophils Relative 0 %   Basophils Absolute 0.0 0.0 - 0.1 K/uL   Immature Granulocytes 1 %   Abs Immature Granulocytes 0.09 (H) 0.00 - 0.07 K/uL  Comprehensive metabolic panel     Status: Abnormal   Collection Time: 07/14/23  3:08 AM  Result Value Ref Range   Sodium 145 135 - 145 mmol/L   Potassium 3.0 (L) 3.5 - 5.1 mmol/L   Chloride 111 98 - 111 mmol/L   CO2 25 22 - 32 mmol/L   Glucose, Bld 134 (H) 70 - 99 mg/dL   BUN 22 8 - 23 mg/dL   Creatinine, Ser 7.82 (L) 0.61 - 1.24 mg/dL   Calcium 6.7 (L) 8.9 - 10.3 mg/dL   Total Protein 4.3 (L) 6.5 - 8.1 g/dL   Albumin 2.0 (L) 3.5 - 5.0 g/dL   AST 9,562 (H) 15 - 41 U/L   ALT 2,337  (H) 0 - 44 U/L   Alkaline Phosphatase 81 38 - 126 U/L   Total Bilirubin 0.9 0.3 - 1.2 mg/dL   GFR, Estimated >13 >08 mL/min   Anion gap 9 5 - 15  Magnesium     Status: None   Collection Time: 07/14/23  3:08 AM  Result Value Ref Range   Magnesium 1.8 1.7 - 2.4 mg/dL  Phosphorus     Status: Abnormal   Collection Time: 07/14/23  3:08 AM  Result Value Ref Range   Phosphorus 1.5 (L) 2.5 - 4.6 mg/dL  Glucose, capillary     Status: Abnormal   Collection Time: 07/14/23  7:42 AM  Result Value Ref Range   Glucose-Capillary 148 (H) 70 - 99 mg/dL  Glucose, capillary     Status: Abnormal   Collection Time: 07/14/23 11:48 AM  Result Value Ref Range   Glucose-Capillary 247 (H) 70 - 99 mg/dL     CT Angio Chest Pulmonary Embolism (PE) W or WO Contrast  Result Date: 07/12/2023 CLINICAL DATA:  Abdomen pain fatigue EXAM: CT ANGIOGRAPHY CHEST CT ABDOMEN AND PELVIS WITH CONTRAST TECHNIQUE: Multidetector CT imaging of the chest was performed using the standard protocol during bolus administration of intravenous contrast. Multiplanar CT image reconstructions and MIPs were obtained to evaluate the vascular anatomy. Multidetector CT imaging of the abdomen and pelvis was performed using the standard protocol during bolus administration of intravenous contrast. RADIATION DOSE REDUCTION: This exam was performed according to the departmental dose-optimization program which includes automated exposure control, adjustment of the mA and/or kV according to patient size and/or use of iterative reconstruction technique. CONTRAST:  OMNIPAQUE IOHEXOL 350 MG/ML SOLN COMPARISON:  Chest x-ray 07/10/2023, CT  06/30/2023 FINDINGS: CTA CHEST FINDINGS Cardiovascular: Satisfactory opacification of the pulmonary arteries to the segmental level. No evidence of pulmonary embolism. Mild aortic atherosclerosis. No aneurysm. No dissection. Normal cardiac size. No pericardial effusion Mediastinum/Nodes: Midline trachea. No thyroid mass.  No suspicious lymph nodes. Esophagus within normal limits. Lungs/Pleura: Mild emphysema. No pleural effusion or pneumothorax. Interim development of multifocal bilateral heterogeneous ground-glass densities. Musculoskeletal: Sternum appears intact. Mild sclerosis in the T10 vertebral body on the left side, corresponding to recent bone scan finding and suspect for skeletal osseous metastatic disease Review of the MIP images confirms the above findings. CT ABDOMEN and PELVIS FINDINGS Hepatobiliary: Innumerable hypodense liver masses consistent with metastatic disease. No calcified gallstone. No biliary dilatation Pancreas: No ductal dilatation. New small volume fluid surrounding the distal body and tail of pancreas. Spleen: Normal in size without focal abnormality. Adrenals/Urinary Tract: Bilateral adrenal masses consistent with metastatic disease. Kidneys show no hydronephrosis. 7 mm soft tissue nodule in the left perirenal space on series 2, image 32, appears separate from kidney and difficult to exclude small metastatic focus. Punctate nonobstructing right kidney stone. Urinary bladder is unremarkable Stomach/Bowel: The stomach is nonenlarged. Mild eccentric wall thickening at the posterior fundus. No small bowel distension. Fluid in the colon. Negative appendix Vascular/Lymphatic: Moderate aortic atherosclerosis. No aneurysm. No suspicious lymph nodes. Reproductive: Negative prostate other than fiducial markers Other: No free air Musculoskeletal: No acute osseous abnormality Review of the MIP images confirms the above findings. IMPRESSION: 1. Negative for acute pulmonary embolus. 2. Interim development of multifocal bilateral heterogeneous ground-glass densities, suspect for multifocal pneumonia, possible atypical/viral organism. 3. Innumerable liver masses consistent with metastatic disease or multifocal liver malignancy. Bilateral adrenal masses consistent with metastatic disease. 4. Mild eccentric wall  thickening at the posterior fundus of stomach, could be correlated with endoscopy as indicated 5. New small volume fluid surrounding the distal body and tail of pancreas, findings could be secondary to pancreatitis, recommend correlation with appropriate laboratory studies. 6. 7 mm soft tissue nodule in the left perirenal space appears separate from the kidney and difficult to exclude small metastatic focus. 7. Minimal sclerosis at the T10 vertebral body suspicious for metastatic bone lesion Aortic Atherosclerosis (ICD10-I70.0) and Emphysema (ICD10-J43.9). Electronically Signed   By: Jasmine Pang M.D.   On: 07/12/2023 18:55   CT ABDOMEN PELVIS W CONTRAST  Result Date: 07/12/2023 CLINICAL DATA:  Abdomen pain fatigue EXAM: CT ANGIOGRAPHY CHEST CT ABDOMEN AND PELVIS WITH CONTRAST TECHNIQUE: Multidetector CT imaging of the chest was performed using the standard protocol during bolus administration of intravenous contrast. Multiplanar CT image reconstructions and MIPs were obtained to evaluate the vascular anatomy. Multidetector CT imaging of the abdomen and pelvis was performed using the standard protocol during bolus administration of intravenous contrast. RADIATION DOSE REDUCTION: This exam was performed according to the departmental dose-optimization program which includes automated exposure control, adjustment of the mA and/or kV according to patient size and/or use of iterative reconstruction technique. CONTRAST:  OMNIPAQUE IOHEXOL 350 MG/ML SOLN COMPARISON:  Chest x-ray 07/10/2023, CT 06/30/2023 FINDINGS: CTA CHEST FINDINGS Cardiovascular: Satisfactory opacification of the pulmonary arteries to the segmental level. No evidence of pulmonary embolism. Mild aortic atherosclerosis. No aneurysm. No dissection. Normal cardiac size. No pericardial effusion Mediastinum/Nodes: Midline trachea. No thyroid mass. No suspicious lymph nodes. Esophagus within normal limits. Lungs/Pleura: Mild emphysema. No pleural  effusion or pneumothorax. Interim development of multifocal bilateral heterogeneous ground-glass densities. Musculoskeletal: Sternum appears intact. Mild sclerosis in the T10 vertebral body on the left side,  corresponding to recent bone scan finding and suspect for skeletal osseous metastatic disease Review of the MIP images confirms the above findings. CT ABDOMEN and PELVIS FINDINGS Hepatobiliary: Innumerable hypodense liver masses consistent with metastatic disease. No calcified gallstone. No biliary dilatation Pancreas: No ductal dilatation. New small volume fluid surrounding the distal body and tail of pancreas. Spleen: Normal in size without focal abnormality. Adrenals/Urinary Tract: Bilateral adrenal masses consistent with metastatic disease. Kidneys show no hydronephrosis. 7 mm soft tissue nodule in the left perirenal space on series 2, image 32, appears separate from kidney and difficult to exclude small metastatic focus. Punctate nonobstructing right kidney stone. Urinary bladder is unremarkable Stomach/Bowel: The stomach is nonenlarged. Mild eccentric wall thickening at the posterior fundus. No small bowel distension. Fluid in the colon. Negative appendix Vascular/Lymphatic: Moderate aortic atherosclerosis. No aneurysm. No suspicious lymph nodes. Reproductive: Negative prostate other than fiducial markers Other: No free air Musculoskeletal: No acute osseous abnormality Review of the MIP images confirms the above findings. IMPRESSION: 1. Negative for acute pulmonary embolus. 2. Interim development of multifocal bilateral heterogeneous ground-glass densities, suspect for multifocal pneumonia, possible atypical/viral organism. 3. Innumerable liver masses consistent with metastatic disease or multifocal liver malignancy. Bilateral adrenal masses consistent with metastatic disease. 4. Mild eccentric wall thickening at the posterior fundus of stomach, could be correlated with endoscopy as indicated 5. New small  volume fluid surrounding the distal body and tail of pancreas, findings could be secondary to pancreatitis, recommend correlation with appropriate laboratory studies. 6. 7 mm soft tissue nodule in the left perirenal space appears separate from the kidney and difficult to exclude small metastatic focus. 7. Minimal sclerosis at the T10 vertebral body suspicious for metastatic bone lesion Aortic Atherosclerosis (ICD10-I70.0) and Emphysema (ICD10-J43.9). Electronically Signed   By: Jasmine Pang M.D.   On: 07/12/2023 18:55    ROS:  As stated above in the HPI otherwise negative.  Blood pressure (!) 143/87, pulse 99, temperature 97.9 F (36.6 C), temperature source Oral, resp. rate 19, height 5\' 9"  (1.753 m), weight 70.9 kg, SpO2 96%.    PE: Gen: NAD, Alert and Oriented HEENT:  [place]/AT, EOMI Neck: Supple, no LAD Lungs: CTA Bilaterally CV: RRR without M/G/R ABD: Soft, NTND, +BS Ext: No C/C/E  Assessment/Plan: 1) Anemia. 2) Melena. 3) Metastatic small cell carcinoma. 4) Shock liver.   The patient is currently stable.  His BP improved and it is back to his moderate hypertensive state.  Since he had a recent gastric ulcer and his HGB declined, further evaluation with a repeat EGD is in order.  Plan: 1) EGD tomorrow. 2) Follow HGB and transfuse as necessary. 3) Continue to follow liver enzymes  Selena Swaminathan D 07/14/2023, 5:05 PM

## 2023-07-15 ENCOUNTER — Inpatient Hospital Stay: Payer: 59

## 2023-07-15 ENCOUNTER — Ambulatory Visit (HOSPITAL_COMMUNITY): Payer: 59 | Attending: Internal Medicine

## 2023-07-15 ENCOUNTER — Encounter (HOSPITAL_COMMUNITY): Admission: EM | Disposition: E | Payer: Self-pay | Source: Home / Self Care | Attending: Internal Medicine

## 2023-07-15 ENCOUNTER — Ambulatory Visit (HOSPITAL_COMMUNITY): Admission: RE | Admit: 2023-07-15 | Payer: 59 | Source: Ambulatory Visit

## 2023-07-15 DIAGNOSIS — R579 Shock, unspecified: Secondary | ICD-10-CM | POA: Diagnosis not present

## 2023-07-15 HISTORY — PX: ESOPHAGOGASTRODUODENOSCOPY (EGD) WITH PROPOFOL: SHX5813

## 2023-07-15 LAB — CBC WITH DIFFERENTIAL/PLATELET
Abs Immature Granulocytes: 0.1 10*3/uL — ABNORMAL HIGH (ref 0.00–0.07)
Basophils Absolute: 0 10*3/uL (ref 0.0–0.1)
Basophils Relative: 0 %
Eosinophils Absolute: 0 10*3/uL (ref 0.0–0.5)
Eosinophils Relative: 0 %
HCT: 21.6 % — ABNORMAL LOW (ref 39.0–52.0)
Hemoglobin: 7.1 g/dL — ABNORMAL LOW (ref 13.0–17.0)
Immature Granulocytes: 2 %
Lymphocytes Relative: 7 %
Lymphs Abs: 0.5 10*3/uL — ABNORMAL LOW (ref 0.7–4.0)
MCH: 30.3 pg (ref 26.0–34.0)
MCHC: 32.9 g/dL (ref 30.0–36.0)
MCV: 92.3 fL (ref 80.0–100.0)
Monocytes Absolute: 0.3 10*3/uL (ref 0.1–1.0)
Monocytes Relative: 5 %
Neutro Abs: 6 10*3/uL (ref 1.7–7.7)
Neutrophils Relative %: 86 %
Platelets: 69 10*3/uL — ABNORMAL LOW (ref 150–400)
RBC: 2.34 MIL/uL — ABNORMAL LOW (ref 4.22–5.81)
RDW: 15.7 % — ABNORMAL HIGH (ref 11.5–15.5)
WBC: 6.9 10*3/uL (ref 4.0–10.5)
nRBC: 1.5 % — ABNORMAL HIGH (ref 0.0–0.2)

## 2023-07-15 LAB — PREPARE RBC (CROSSMATCH)

## 2023-07-15 LAB — COMPREHENSIVE METABOLIC PANEL
ALT: 1564 U/L — ABNORMAL HIGH (ref 0–44)
AST: 365 U/L — ABNORMAL HIGH (ref 15–41)
Albumin: 1.9 g/dL — ABNORMAL LOW (ref 3.5–5.0)
Alkaline Phosphatase: 104 U/L (ref 38–126)
Anion gap: 8 (ref 5–15)
BUN: 16 mg/dL (ref 8–23)
CO2: 26 mmol/L (ref 22–32)
Calcium: 6.7 mg/dL — ABNORMAL LOW (ref 8.9–10.3)
Chloride: 108 mmol/L (ref 98–111)
Creatinine, Ser: 0.35 mg/dL — ABNORMAL LOW (ref 0.61–1.24)
GFR, Estimated: >60 mL/min (ref >60)
Glucose, Bld: 167 mg/dL — ABNORMAL HIGH (ref 70–99)
Potassium: 3.3 mmol/L — ABNORMAL LOW (ref 3.5–5.1)
Sodium: 142 mmol/L (ref 135–145)
Total Bilirubin: 0.5 mg/dL (ref 0.3–1.2)
Total Protein: 4.4 g/dL — ABNORMAL LOW (ref 6.5–8.1)

## 2023-07-15 LAB — GLUCOSE, CAPILLARY
Glucose-Capillary: 149 mg/dL — ABNORMAL HIGH (ref 70–99)
Glucose-Capillary: 161 mg/dL — ABNORMAL HIGH (ref 70–99)
Glucose-Capillary: 94 mg/dL (ref 70–99)
Glucose-Capillary: 83 mg/dL (ref 70–99)
Glucose-Capillary: 212 mg/dL — ABNORMAL HIGH (ref 70–99)

## 2023-07-15 LAB — MAGNESIUM: Magnesium: 1.6 mg/dL — ABNORMAL LOW (ref 1.7–2.4)

## 2023-07-15 LAB — PHOSPHORUS: Phosphorus: 1.4 mg/dL — ABNORMAL LOW (ref 2.5–4.6)

## 2023-07-15 LAB — HEMOGLOBIN AND HEMATOCRIT, BLOOD
HCT: 24.7 % — ABNORMAL LOW (ref 39.0–52.0)
Hemoglobin: 8.1 g/dL — ABNORMAL LOW (ref 13.0–17.0)

## 2023-07-15 SURGERY — ESOPHAGOGASTRODUODENOSCOPY (EGD) WITH PROPOFOL
Anesthesia: Moderate Sedation

## 2023-07-15 MED ORDER — FENTANYL CITRATE (PF) 100 MCG/2ML IJ SOLN
INTRAMUSCULAR | Status: DC | PRN
Start: 2023-07-15 — End: 2023-07-15
  Administered 2023-07-15 (×2): 25 ug via INTRAVENOUS

## 2023-07-15 MED ORDER — MIDAZOLAM HCL 2 MG/2ML IJ SOLN
INTRAMUSCULAR | Status: AC
  Filled 2023-07-15: qty 2

## 2023-07-15 MED ORDER — MIDAZOLAM HCL 2 MG/2ML IJ SOLN
INTRAMUSCULAR | Status: AC
  Filled 2023-07-15: qty 8

## 2023-07-15 MED ORDER — MIDAZOLAM HCL 2 MG/2ML IJ SOLN
INTRAMUSCULAR | Status: DC | PRN
Start: 2023-07-15 — End: 2023-07-15
  Administered 2023-07-15 (×2): 2 mg via INTRAVENOUS

## 2023-07-15 MED ORDER — SODIUM CHLORIDE 0.9% IV SOLUTION: Freq: Once | INTRAVENOUS | Status: AC

## 2023-07-15 MED ORDER — SODIUM CHLORIDE 0.9 % IV SOLN: INTRAVENOUS | Status: DC

## 2023-07-15 MED ORDER — FENTANYL CITRATE (PF) 100 MCG/2ML IJ SOLN
INTRAMUSCULAR | Status: AC
  Filled 2023-07-15: qty 4

## 2023-07-15 MED ORDER — POTASSIUM PHOSPHATES 15 MMOLE/5ML IV SOLN
30.0000 mmol | Freq: Once | INTRAVENOUS | Status: AC
  Administered 2023-07-15: 30 mmol via INTRAVENOUS
  Filled 2023-07-15: qty 10

## 2023-07-15 MED ORDER — MAGNESIUM SULFATE 2 GM/50ML IV SOLN
2.0000 g | Freq: Once | INTRAVENOUS | Status: AC
  Administered 2023-07-15: 2 g via INTRAVENOUS
  Filled 2023-07-15: qty 50

## 2023-07-15 SURGICAL SUPPLY — 15 items

## 2023-07-15 NOTE — Plan of Care (Signed)
Plan of care and goals reviewed with patient, time given for questions, patient handbook/guide at bedside.  Problem: Education: Goal: Ability to describe self-care measures that may prevent or decrease complications (Diabetes Survival Skills Education) will improve Outcome: Progressing Goal: Individualized Educational Video(s) Outcome: Progressing   Problem: Coping: Goal: Ability to adjust to condition or change in health will improve Outcome: Progressing   Problem: Fluid Volume: Goal: Ability to maintain a balanced intake and output will improve Outcome: Progressing   Problem: Health Behavior/Discharge Planning: Goal: Ability to identify and utilize available resources and services will improve Outcome: Progressing Goal: Ability to manage health-related needs will improve Outcome: Progressing   Problem: Metabolic: Goal: Ability to maintain appropriate glucose levels will improve Outcome: Progressing   Problem: Nutritional: Goal: Maintenance of adequate nutrition will improve Outcome: Progressing Goal: Progress toward achieving an optimal weight will improve Outcome: Progressing   Problem: Skin Integrity: Goal: Risk for impaired skin integrity will decrease Outcome: Progressing   Problem: Tissue Perfusion: Goal: Adequacy of tissue perfusion will improve Outcome: Progressing   Problem: Education: Goal: Knowledge of General Education information will improve Description: Including pain rating scale, medication(s)/side effects and non-pharmacologic comfort measures Outcome: Progressing   Problem: Health Behavior/Discharge Planning: Goal: Ability to manage health-related needs will improve Outcome: Progressing   Problem: Clinical Measurements: Goal: Ability to maintain clinical measurements within normal limits will improve Outcome: Progressing Goal: Will remain free from infection Outcome: Progressing Goal: Diagnostic test results will improve Outcome:  Progressing Goal: Respiratory complications will improve Outcome: Progressing Goal: Cardiovascular complication will be avoided Outcome: Progressing   Problem: Activity: Goal: Risk for activity intolerance will decrease Outcome: Progressing   Problem: Nutrition: Goal: Adequate nutrition will be maintained Outcome: Progressing   Problem: Coping: Goal: Level of anxiety will decrease Outcome: Progressing   Problem: Elimination: Goal: Will not experience complications related to bowel motility Outcome: Progressing Goal: Will not experience complications related to urinary retention Outcome: Progressing   Problem: Pain Managment: Goal: General experience of comfort will improve Outcome: Progressing   Problem: Safety: Goal: Ability to remain free from injury will improve Outcome: Progressing   Problem: Skin Integrity: Goal: Risk for impaired skin integrity will decrease Outcome: Progressing   Problem: Fluid Volume: Goal: Hemodynamic stability will improve Outcome: Progressing   Problem: Clinical Measurements: Goal: Diagnostic test results will improve Outcome: Progressing Goal: Signs and symptoms of infection will decrease Outcome: Progressing   Problem: Respiratory: Goal: Ability to maintain adequate ventilation will improve Outcome: Progressing   Problem: Education: Goal: Knowledge of the prescribed therapeutic regimen will improve Outcome: Progressing   Problem: Activity: Goal: Ability to implement measures to reduce episodes of fatigue will improve Outcome: Progressing   Problem: Bowel/Gastric: Goal: Will not experience complications related to bowel motility Outcome: Progressing   Problem: Coping: Goal: Ability to identify and develop effective coping behavior will improve Outcome: Progressing   Problem: Nutritional: Goal: Maintenance of adequate nutrition will improve Outcome: Progressing

## 2023-07-15 NOTE — Interval H&P Note (Signed)
History and Physical Interval Note:  07/15/2023 4:17 PM  William Jones  has presented today for surgery, with the diagnosis of Melena and anemia.  The various methods of treatment have been discussed with the patient and family. After consideration of risks, benefits and other options for treatment, the patient has consented to  Procedure(s): ESOPHAGOGASTRODUODENOSCOPY (EGD) WITH PROPOFOL (N/A) as a surgical intervention.  The patient's history has been reviewed, patient examined, no change in status, stable for surgery.  I have reviewed the patient's chart and labs.  Questions were answered to the patient's satisfaction.     Marylen Zuk D

## 2023-07-15 NOTE — Progress Notes (Signed)
OT Cancellation Note  Patient Details Name: William Jones MRN: [id number] DOB: 09-17-1951   Cancelled Treatment:    Reason Eval/Treat Not Completed: Patient at procedure or test/ unavailable.   Reuben Likes, OTR/L 07/15/2023, 4:28 PM

## 2023-07-15 NOTE — Plan of Care (Signed)
Progressing towards goals for transfer

## 2023-07-15 NOTE — Op Note (Signed)
[facility] Patient Name: William Jones Procedure Date: 07/08/2023 MRN: [id number] Attending MD: Jeani Hawking , MD, [id number] Date of Birth: 1951-07-24 CSN: [id number] Age: 72 Admit Type: Inpatient Procedure:                Upper GI endoscopy Indications:              Melena Providers:                Jeani Hawking, MD, Lorenza Evangelist, RN, [place]                            [place], Technician, [facility], Technician Referring MD:              Medicines:                Fentanyl 50 micrograms IV, Midazolam 4 mg IV Complications:            No immediate complications. Estimated Blood Loss:     Estimated blood loss: none. Procedure:                Pre-Anesthesia Assessment:                           - Prior to the procedure, a History and Physical                            was performed, and patient medications and                            allergies were reviewed. The patient's tolerance of                            previous anesthesia was also reviewed. The risks                            and benefits of the procedure and the sedation                            options and risks were discussed with the patient.                            All questions were answered, and informed consent                            was obtained. Prior Anticoagulants: The patient has                            taken no anticoagulant or antiplatelet agents. ASA                            Grade Assessment: IV - A patient with severe                            systemic disease that is a constant threat to life.  After reviewing the risks and benefits, the patient                            was deemed in satisfactory condition to undergo the                            procedure.                           - Sedation was administered by the nurse. The                            sedation level attained was moderate.                           After obtaining  informed consent, the endoscope was                            passed under direct vision. Throughout the                            procedure, the patient's blood pressure, pulse, and                            oxygen saturations were monitored continuously. The                            [id number]-[id number] ([id number]) Olympus endoscope was introduced                            through the mouth, and advanced to the second part                            of duodenum. The upper GI endoscopy was                            accomplished without difficulty. The patient                            tolerated the procedure well. Scope In: Scope Out: Findings:      The esophagus was normal.      Multiple dispersed diminutive erosions with no stigmata of recent       bleeding were found in the gastric body.      The examined duodenum was normal. Impression:               - Normal esophagus.                           - Erosive gastropathy with no stigmata of recent                            bleeding.                           - Normal examined duodenum.                           -  No specimens collected. Moderate Sedation:      Moderate (conscious) sedation was administered by the nurse and       supervised by the endoscopist. The following parameters were monitored:       oxygen saturation, heart rate, blood pressure, and response to care. Recommendation:           - Return patient to hospital ward for ongoing care.                           - Resume regular diet.                           - Continue present medications.                           - Follow HGB and transfuse as necessary.                           - Signing off. Procedure Code(s):        --- Professional ---                           [phone], Esophagogastroduodenoscopy, flexible,                            transoral; diagnostic, including collection of                            specimen(s) by brushing or washing, when performed                             (separate procedure) Diagnosis Code(s):        --- Professional ---                           K31.89, Other diseases of stomach and duodenum                           K92.1, Melena (includes Hematochezia) CPT copyright 2022 American [employer] Association. All rights reserved. The codes documented in this report are preliminary and upon coder review may  be revised to meet current compliance requirements. Jeani Hawking, MD Jeani Hawking, MD 07/30/2023 4:32:41 PM This report has been signed electronically. Number of Addenda: 0

## 2023-07-15 NOTE — Progress Notes (Signed)
PROGRESS NOTE    Jadakis Wiand [place]  ZOX:[id number] DOB: 1950/10/18 DOA: 2023/08/09 PCP: Morene Crocker, MD    Brief Narrative:  Mr. Worku is a 72 year old M with HTN, HLD, and prostate cancer, recently admitted for generalized weakness, found to have new metastatic neuroendocrine tumor to liver and adrenals.  Was discharged home with plans for outpatient chemo start, but became profoundly weak, returned to the hospital    In the ER,transaminitis due to tumor, mild hypokalemia, WBC normal, no fever.  UA with pyuria.   08/09/23: Started on CTX, admitted for weakness, Xtandi held 10/7: Still weak but stable, Oncology consulted, planned for inpatient chemo 10/8: Patient acutely tachycardic, weak --> Lactate >9 --> transferred to ICU and antibiotics broadened, CCM consulted  10/9: Patient with hemoglobin less than 7.  Has history of bleeding gastric ulcer and 2 units of blood transfusion a month ago.  He was not able to continue Protonix with suspected allergy. Stabilizing, for EGD today.   Assessment & Plan:   Septic shock and lactic acidosis: Lactic acid likely due to slow clearance from liver disease.  Blood pressure has been more or less stable.  Blood cultures negative. Urine cultures with Klebsiella oxytoca, antibiotic broadened to meropenem and vancomycin. On stress dose of steroids.  Underwent CT angiogram of the chest, CT scan abdomen pelvis 10/8 that shows Multifocal pneumonia, possibly atypical organism Innumerable liver masses consistent with metastatic disease, multiple adrenal masses Pancreatic edema. With clinical improvement Discontinue meropenem and vancomycin, de-escalate antibiotics to Unasyn to cover Klebsiella UTI and pneumonia.  Will continue Unasyn for 5 days. Discontinue steroids, he has adequate blood pressures.  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  Oncology following.  Unlikely ready for chemotherapy  with current level of weakness, serious illness requiring ICU admission.  Likely discharge to SNF and come back for chemotherapy.  Hypokalemia, hypophosphatemia and hypomagnesemia: Severe and persistent.  Replace further today. Replace aggressively and recheck levels   Anemia , acute on chronic blood loss: 1 unit of PRBC transfused 10/9. Hemoglobin 7.1 today.  With chronic illness, suspected ongoing bleeding, 1 more unit of PRBC today. Known gastric ulcer, endoscopy about a month ago.  Reported not feeling well with Protonix and did not continue.  Patient likely has recurrent gastric ulcer. Started on Protonix and tolerating.  Allergies updated.  Patient is not allergic to Protonix.  1 unit of PRBC today as above.  Upper GI endoscopy today.   Abnormal liver function test, transaminitis.  Worsening due to shock liver. improvement today.  Continue to monitor.  Essential hypertension: Blood pressure medications on hold at risk of hypotension.  Prostate cancer: Hold Xtandi for now.      DVT prophylaxis: Place and maintain sequential compression device Start: 07/14/23 1101   Code Status: Full code Family Communication: None today. Disposition Plan: Status is: Inpatient Remains inpatient appropriate because: IV antibiotics debility, acute infection, GI bleeding     Consultants:  Oncology Critical care GI,  Procedures:  None  Antimicrobials:  Vancomycin meropenem 10/8--- 10/10 Unasyn 10/10---   Subjective:  Patient seen and examined.  Feels more relaxed.  Denies any nausea vomiting.  Patient had 1 episode of small amount of black tarry stool overnight.  Denies any abdominal pain or cramping.  Consented for blood transfusion. Overnight remains stable.  On minimal oxygen.  Blood pressures are adequate.  More interactive than usual.  Objective: Vitals:   07/18/2023 0600 07/18/2023 0700 07/06/2023 0800 07/06/2023 0900  BP:  137/79 (!) 173/121 139/74 138/66  Pulse:      Resp: 14 (!)  35 (!) 24 16  Temp:   97.6 F (36.4 C)   TempSrc:   Oral   SpO2:      Weight:      Height:        Intake/Output Summary (Last 24 hours) at 07/12/2023 1008 Last data filed at 07/06/2023 0808 Gross per 24 hour  Intake 1404.07 ml  Output 750 ml  Net 654.07 ml   Filed Weights   18-Jul-2023 1145 07/12/23 1545  Weight: 68 kg 70.9 kg    Examination:  General exam: Calm and comfortable.  Frail and debilitated. Respiratory system: Clear to auscultation. Respiratory effort normal.  No added sounds. Cardiovascular system: S1 & S2 heard, regular rate rhythm. Gastrointestinal system: Abdomen is nondistended, soft and nontender.  Bowel sound present. Central nervous system: Alert and oriented. No focal neurological deficits. Extremities: Symmetric 5 x 5 power.  Gross generalized weakness.    Data Reviewed: I have personally reviewed following labs and imaging studies  CBC: Recent Labs  Lab 18-Jul-2023 1433 07/11/23 0519 07/12/23 0546 07/12/23 1118 07/13/23 0318 07/13/23 1154 07/14/23 0308 07/27/2023 0323  WBC 6.1   < > 7.1 5.4 6.1  --  7.1 6.9  NEUTROABS 5.5  --   --  4.8  --   --  6.3 6.0  HGB 10.8*   < > 8.8* 9.2* 6.4* 8.6* 7.8* 7.1*  HCT 32.0*   < > 26.6* 29.9* 20.0* 25.7* 23.4* 21.6*  MCV 91.7   < > 93.0 99.3 94.8  --  92.5 92.3  PLT 170   < > 171 183 116*  --  72* 69*   < > = values in this interval not displayed.   Basic Metabolic Panel: Recent Labs  Lab 07/12/23 0546 07/12/23 1118 07/12/23 1557 07/13/23 0318 07/14/23 0308 07/23/2023 0323  NA 143 146*  --  146* 145 142  K 3.8 4.3  --  3.2* 3.0* 3.3*  CL 105 110  --  111 111 108  CO2 23 13*  --  26 25 26   GLUCOSE 428* 302*  --  193* 134* 167*  BUN 43* 50*  --  39* 22 16  CREATININE 0.56* 0.89  --  0.52* 0.40* 0.35*  CALCIUM 7.5* 7.7*  --  7.1* 6.7* 6.7*  MG  --   --   --   --  1.8 1.6*  PHOS  --   --  1.2*  --  1.5* 1.4*   GFR: Estimated Creatinine Clearance: 85.9 mL/min (A) (by C-G formula based on SCr of 0.35  mg/dL (L)). Liver Function Tests: Recent Labs  Lab 07/12/23 0546 07/12/23 1118 07/13/23 0318 07/14/23 0308 08/01/2023 0323  AST 38 239* 3,101* 1,065* 365*  ALT 68* 352* 3,140* 2,337* 1,564*  ALKPHOS 83 96 74 81 104  BILITOT 0.9 1.0 0.9 0.9 0.5  PROT 4.9* 5.2* 4.5* 4.3* 4.4*  ALBUMIN 2.2* 2.5* 2.1* 2.0* 1.9*   No results for input(s): "LIPASE", "AMYLASE" in the last 168 hours. Recent Labs  Lab 18-Jul-2023 1433  AMMONIA 22   Coagulation Profile: Recent Labs  Lab 07-18-2023 1433 07/12/23 1118  INR 1.1 1.5*   Cardiac Enzymes: Recent Labs  Lab 07/12/23 0546  CKTOTAL 93   BNP (last 3 results) No results for input(s): "PROBNP" in the last 8760 hours. HbA1C: No results for input(s): "HGBA1C" in the last 72 hours. CBG: Recent Labs  Lab 07/14/23 [phone]  07/14/23 1148 07/14/23 1712 07/14/23 2156 07/26/2023 0808  GLUCAP 148* 247* 206* 98 149*   Lipid Profile: No results for input(s): "CHOL", "HDL", "LDLCALC", "TRIG", "CHOLHDL", "LDLDIRECT" in the last 72 hours. Thyroid Function Tests: No results for input(s): "TSH", "T4TOTAL", "FREET4", "T3FREE", "THYROIDAB" in the last 72 hours.  Anemia Panel: No results for input(s): "VITAMINB12", "FOLATE", "FERRITIN", "TIBC", "IRON", "RETICCTPCT" in the last 72 hours. Sepsis Labs: Recent Labs  Lab 07/12/23 1118 07/12/23 1215 07/12/23 1557 07/12/23 2149  LATICACIDVEN >9.0* >9.0* 8.1* 3.4*    Recent Results (from the past 240 hour(s))  Resp panel by RT-PCR (RSV, Flu A&B, Covid) Urine, Clean Catch     Status: None   Collection Time: 2023/07/24  1:30 PM   Specimen: Urine, Clean Catch; Nasal Swab  Result Value Ref Range Status   SARS Coronavirus 2 by RT PCR NEGATIVE NEGATIVE Final    Comment: (NOTE) SARS-CoV-2 target nucleic acids are NOT DETECTED.  The SARS-CoV-2 RNA is generally detectable in upper respiratory specimens during the acute phase of infection. The lowest concentration of SARS-CoV-2 viral copies this assay can detect  is 138 copies/mL. A negative result does not preclude SARS-Cov-2 infection and should not be used as the sole basis for treatment or other patient management decisions. A negative result may occur with  improper specimen collection/handling, submission of specimen other than nasopharyngeal swab, presence of viral mutation(s) within the areas targeted by this assay, and inadequate number of viral copies(<138 copies/mL). A negative result must be combined with clinical observations, patient history, and epidemiological information. The expected result is Negative.  Fact Sheet for Patients:  BloggerCourse.com  Fact Sheet for Healthcare Providers:  SeriousBroker.it  This test is no t yet approved or cleared by the [place] FDA and  has been authorized for detection and/or diagnosis of SARS-CoV-2 by FDA under an Emergency Use Authorization (EUA). This EUA will remain  in effect (meaning this test can be used) for the duration of the COVID-19 declaration under Section 564(b)(1) of the Act, 21 U.S.C.section 360bbb-3(b)(1), unless the authorization is terminated  or revoked sooner.       Influenza A by PCR NEGATIVE NEGATIVE Final   Influenza B by PCR NEGATIVE NEGATIVE Final    Comment: (NOTE) The Xpert Xpress SARS-CoV-2/FLU/RSV plus assay is intended as an aid in the diagnosis of influenza from Nasopharyngeal swab specimens and should not be used as a sole basis for treatment. Nasal washings and aspirates are unacceptable for Xpert Xpress SARS-CoV-2/FLU/RSV testing.  Fact Sheet for Patients: BloggerCourse.com  Fact Sheet for Healthcare Providers: SeriousBroker.it  This test is not yet approved or cleared by the [place] FDA and has been authorized for detection and/or diagnosis of SARS-CoV-2 by FDA under an Emergency Use Authorization (EUA). This EUA will remain in effect  (meaning this test can be used) for the duration of the COVID-19 declaration under Section 564(b)(1) of the Act, 21 U.S.C. section 360bbb-3(b)(1), unless the authorization is terminated or revoked.     Resp Syncytial Virus by PCR NEGATIVE NEGATIVE Final    Comment: (NOTE) Fact Sheet for Patients: BloggerCourse.com  Fact Sheet for Healthcare Providers: SeriousBroker.it  This test is not yet approved or cleared by the [place] FDA and has been authorized for detection and/or diagnosis of SARS-CoV-2 by FDA under an Emergency Use Authorization (EUA). This EUA will remain in effect (meaning this test can be used) for the duration of the COVID-19 declaration under Section 564(b)(1) of the Act, 21 U.S.C. section 360bbb-3(b)(1),  unless the authorization is terminated or revoked.  Performed at [facility], [address], [place], [place] [address]   Urine Culture (for pregnant, neutropenic or urologic patients or patients with an indwelling urinary catheter)     Status: Abnormal   Collection Time: 2023-08-05  1:30 PM   Specimen: Urine, Clean Catch  Result Value Ref Range Status   Specimen Description   Final    URINE, CLEAN CATCH Performed at [facility], [address], [place], [place] [address]    Special Requests   Final    NONE Performed at [facility], [address], [place], [place] [address]    Culture >=100,000 COLONIES/mL KLEBSIELLA OXYTOCA (A)  Final   Report Status 07/13/2023 FINAL  Final   Organism ID, Bacteria KLEBSIELLA OXYTOCA (A)  Final      Susceptibility   Klebsiella oxytoca - MIC*    AMPICILLIN >=32 RESISTANT Resistant     CEFEPIME <=0.12 SENSITIVE Sensitive     CEFTRIAXONE <=0.25 SENSITIVE Sensitive     CIPROFLOXACIN <=0.25 SENSITIVE Sensitive     GENTAMICIN <=1 SENSITIVE Sensitive     IMIPENEM <=0.25 SENSITIVE Sensitive     NITROFURANTOIN 32  SENSITIVE Sensitive     TRIMETH/SULFA <=20 SENSITIVE Sensitive     AMPICILLIN/SULBACTAM 4 SENSITIVE Sensitive     PIP/TAZO <=4 SENSITIVE Sensitive ug/mL    * >=100,000 COLONIES/mL KLEBSIELLA OXYTOCA  Culture, blood (x 2)     Status: None (Preliminary result)   Collection Time: 07/12/23 11:18 AM   Specimen: BLOOD RIGHT HAND  Result Value Ref Range Status   Specimen Description   Final    BLOOD RIGHT HAND Performed at [facility], [address], [place], [place] [address]    Special Requests   Final    BOTTLES DRAWN AEROBIC AND ANAEROBIC Blood Culture adequate volume Performed at [facility], [address], [place], [place] [address]    Culture   Final    NO GROWTH 2 DAYS Performed at [facility], [address], [place], [place] [address]    Report Status PENDING  Incomplete  Culture, blood (x 2)     Status: None (Preliminary result)   Collection Time: 07/12/23 11:19 AM   Specimen: BLOOD RIGHT ARM  Result Value Ref Range Status   Specimen Description   Final    BLOOD RIGHT ARM Performed at [facility], [address] [place], [place] [address]    Special Requests   Final    BOTTLES DRAWN AEROBIC AND ANAEROBIC Blood Culture adequate volume Performed at [facility], [address], [place], [place] [address]    Culture   Final    NO GROWTH 2 DAYS Performed at [facility], [address], [place], [place] [address]    Report Status PENDING  Incomplete  MRSA Next Gen by PCR, Nasal     Status: None   Collection Time: 07/12/23  3:46 PM   Specimen: Nasal Mucosa; Nasal Swab  Result Value Ref Range Status   MRSA by PCR Next Gen NOT DETECTED NOT DETECTED Final    Comment: (NOTE) The GeneXpert MRSA Assay (FDA approved for NASAL specimens only), is one component of a comprehensive MRSA colonization surveillance program. It is not intended to diagnose MRSA infection nor to guide or monitor treatment for MRSA  infections. Test performance is not FDA approved in patients less than 64 years old. Performed  at [facility], [address], [place], [place] [address]          Radiology Studies: No results found.      Scheduled Meds:  sodium chloride   Intravenous Once   sodium chloride   Intravenous Once   CARBOplatin  470 mg Intravenous Once   Chlorhexidine Gluconate Cloth  6 each Topical Daily   docusate sodium  100 mg Oral BID   dorzolamide  1 drop Right Eye BID   etoposide  80 mg/m2 (Treatment Plan Recorded) Intravenous Once   feeding supplement (GLUCERNA SHAKE)  237 mL Oral BID BM   insulin aspart  0-15 Units Subcutaneous TID WC   insulin aspart  0-5 Units Subcutaneous QHS   insulin glargine-yfgn  12 Units Subcutaneous Daily   mouth rinse  15 mL Mouth Rinse 4 times per day   palonosetron  0.25 mg Intravenous Once   pantoprazole (PROTONIX) IV  40 mg Intravenous Q12H   sodium chloride flush  3 mL Intravenous Q12H   timolol  1 drop Right Eye BID   Continuous Infusions:  ampicillin-sulbactam (UNASYN) IV Stopped (07/30/2023 0622)   dexamethasone (DECADRON) IVPB ([facility])     fosaprepitant (EMEND) 150 mg in sodium chloride 0.9 % 145 mL IVPB     potassium PHOSPHATE IVPB (in mmol) 30 mmol (07/14/2023 0937)     LOS: 3 days    Time spent: 40 minutes    Dorcas Carrow, MD [facility]

## 2023-07-15 NOTE — Plan of Care (Signed)
Progressing towards transfer

## 2023-07-16 DIAGNOSIS — R579 Shock, unspecified: Secondary | ICD-10-CM | POA: Diagnosis not present

## 2023-07-16 LAB — CBC WITH DIFFERENTIAL/PLATELET
Abs Immature Granulocytes: 0.14 10*3/uL — ABNORMAL HIGH (ref 0.00–0.07)
Basophils Absolute: 0 10*3/uL (ref 0.0–0.1)
Basophils Relative: 0 %
Eosinophils Absolute: 0 10*3/uL (ref 0.0–0.5)
Eosinophils Relative: 0 %
HCT: 26.5 % — ABNORMAL LOW (ref 39.0–52.0)
Hemoglobin: 8.9 g/dL — ABNORMAL LOW (ref 13.0–17.0)
Immature Granulocytes: 2 %
Lymphocytes Relative: 5 %
Lymphs Abs: 0.4 10*3/uL — ABNORMAL LOW (ref 0.7–4.0)
MCH: 31.1 pg (ref 26.0–34.0)
MCHC: 33.6 g/dL (ref 30.0–36.0)
MCV: 92.7 fL (ref 80.0–100.0)
Monocytes Absolute: 0.5 10*3/uL (ref 0.1–1.0)
Monocytes Relative: 7 %
Neutro Abs: 6.3 10*3/uL (ref 1.7–7.7)
Neutrophils Relative %: 86 %
Platelets: 73 10*3/uL — ABNORMAL LOW (ref 150–400)
RBC: 2.86 MIL/uL — ABNORMAL LOW (ref 4.22–5.81)
RDW: 15.1 % (ref 11.5–15.5)
WBC: 7.4 10*3/uL (ref 4.0–10.5)
nRBC: 2.6 % — ABNORMAL HIGH (ref 0.0–0.2)

## 2023-07-16 LAB — COMPREHENSIVE METABOLIC PANEL
ALT: 1123 U/L — ABNORMAL HIGH (ref 0–44)
AST: 166 U/L — ABNORMAL HIGH (ref 15–41)
Albumin: 1.9 g/dL — ABNORMAL LOW (ref 3.5–5.0)
Alkaline Phosphatase: 120 U/L (ref 38–126)
Anion gap: 7 (ref 5–15)
BUN: 13 mg/dL (ref 8–23)
CO2: 29 mmol/L (ref 22–32)
Calcium: 6.5 mg/dL — ABNORMAL LOW (ref 8.9–10.3)
Chloride: 108 mmol/L (ref 98–111)
Creatinine, Ser: 0.39 mg/dL — ABNORMAL LOW (ref 0.61–1.24)
GFR, Estimated: >60 mL/min (ref >60)
Glucose, Bld: 150 mg/dL — ABNORMAL HIGH (ref 70–99)
Potassium: 3.5 mmol/L (ref 3.5–5.1)
Sodium: 144 mmol/L (ref 135–145)
Total Bilirubin: 0.7 mg/dL (ref 0.3–1.2)
Total Protein: 4.6 g/dL — ABNORMAL LOW (ref 6.5–8.1)

## 2023-07-16 LAB — MAGNESIUM: Magnesium: 1.9 mg/dL (ref 1.7–2.4)

## 2023-07-16 LAB — GLUCOSE, CAPILLARY
Glucose-Capillary: 147 mg/dL — ABNORMAL HIGH (ref 70–99)
Glucose-Capillary: 182 mg/dL — ABNORMAL HIGH (ref 70–99)
Glucose-Capillary: 170 mg/dL — ABNORMAL HIGH (ref 70–99)
Glucose-Capillary: 151 mg/dL — ABNORMAL HIGH (ref 70–99)

## 2023-07-16 LAB — PHOSPHORUS: Phosphorus: 1.5 mg/dL — ABNORMAL LOW (ref 2.5–4.6)

## 2023-07-16 MED ORDER — ENOXAPARIN SODIUM 40 MG/0.4ML IJ SOSY
40.0000 mg | PREFILLED_SYRINGE | Freq: Every day | INTRAMUSCULAR | Status: DC
  Administered 2023-07-16: 40 mg via SUBCUTANEOUS
  Filled 2023-07-16 (×2): qty 0.4

## 2023-07-16 MED ORDER — K PHOS MONO-SOD PHOS DI & MONO 155-852-130 MG PO TABS
250.0000 mg | ORAL_TABLET | Freq: Three times a day (TID) | ORAL | Status: AC
  Administered 2023-07-16 – 2023-07-17 (×4): 250 mg via ORAL
  Filled 2023-07-16 (×4): qty 1

## 2023-07-16 MED ORDER — POTASSIUM CHLORIDE 20 MEQ PO PACK
40.0000 meq | PACK | Freq: Once | ORAL | Status: AC
  Administered 2023-07-16: 40 meq via ORAL
  Filled 2023-07-16: qty 2

## 2023-07-16 NOTE — Progress Notes (Signed)
PROGRESS NOTE    William Jones [facility]  FAO:[id number] DOB: 01/04/1951 DOA: 07/28/2023 PCP: Morene Crocker, MD    Brief Narrative:  The patient is a 72 year old African-American male with past medical history significant for prostate cancer, hypertension, hyperlipidemia and glaucoma.  Patient was admitted for generalized weakness.  Patient was found to have new metastatic neuroendocrine tumor to liver and adrenals.    10/6: Started on CTX, admitted for weakness, Xtandi held 10/7: Still weak but stable, Oncology consulted, planned for inpatient chemo 10/8: Patient acutely tachycardic, weak --> Lactate >9 --> transferred to ICU and antibiotics broadened, CCM consulted  10/9: Patient with hemoglobin less than 7.  Has history of bleeding gastric ulcer and 2 units of blood transfusion a month ago.  He was not able to continue Protonix with suspected allergy. Stabilizing, for EGD.  07/16/2023: Patient seen.  No new complaints.  Poor historian.  Patient underwent EGD on 07/13/2023 that revealed normal esophagus, erosive gastropathy with no stigmata of recent bleeding, normal examined duodenum.  No specimens were collected.  Labs reviewed today (07/15/2024 (: Chemistry reveals sodium of 144, potassium of 3.5, chloride 108, CO2 29, BUN of 13, serum creatinine was 0.39, calcium of 6.5, phosphorus of 1.5, albumin of 1.9, AST of 166, ALT of 1123, total protein of 4.6, estimated GFR of greater than 60 mL/min per 1.73 m.  CBC revealed WBC of 7.4, hemoglobin of 8.9, hematocrit of 26.5, MCV of 92.7 with platelet count of 73.  Assessment & Plan: Septic shock and lactic acidosis (as per prior documentation): -No sepsis physiology noted today.   -Urine culture grew Klebsiella oxytoca.  Blood culture has not grown any organisms. -Patient is on IV Unasyn 3 g every 6 hours.  Patient was previously on vancomycin and meropenem. -CT angiogram of the chest, CT scan abdomen pelvis 10/8 that showed multifocal  pneumonia, possibly, atypical organism; Innumerable liver masses consistent with metastatic disease, multiple adrenal masses and Pancreatic edema.  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   -Oncology following.   -Unlikely ready for chemotherapy with current level of weakness, serious illness requiring ICU admission.   -Likely discharge to SNF and come back for chemotherapy.  Hypokalemia: -Potassium is 3.5 today. -KCl 1M EQ x 1.  Hypophosphatemia: -Neutra-Phos. -Continue to monitor phosphorus level.  Hypomagnesemia: -Magnesium is 1.9 today. -Continue to monitor closely.  Anemia: -Likely multifactorial. -Patient underwent EGD on 07/19/2023 (see above documentation). -Hemoglobin of 8.9 g/dL today. -Continue to monitor closely.  Transfuse PRBC as deemed necessary.   -Prior history of gastric ulcer documented.   Abnormal liver function test, transaminitis: -See above documentation.  Essential hypertension: -Continue to optimize. -Last documented blood pressure was 148/80 mmHg.    Prostate cancer:  -[name] is on hold for now.   DVT prophylaxis: Subcutaneous Lovenox.  Code Status: Full code Family Communication: None today. Disposition Plan: Status is: Inpatient Remains inpatient appropriate because: IV antibiotics debility, acute infection, GI bleeding   Consultants:  Oncology Critical care GI  Procedures:  EGD  Antimicrobials:  Vancomycin meropenem 10/8--- 10/10 Unasyn 10/10---   Subjective: -Patient is a poor historian.  Objective: Vitals:   07/16/23 1100 07/16/23 1200 07/16/23 1229 07/16/23 1300  BP: 139/68 (!) 159/95  (!) 160/93  Pulse: 95 88  95  Resp: (!) 26 (!) 34  (!) 24  Temp:   98.1 F (36.7 C)   TempSrc:   Oral   SpO2: 99% 100%  100%  Weight:  Height:        Intake/Output Summary (Last 24 hours) at 07/16/2023 1405 Last data filed at 07/16/2023 0730 Gross per 24 hour  Intake 501.05 ml   Output 750 ml  Net -248.95 ml   Filed Weights   07/09/2023 1145 07/12/23 1545 07/31/2023 1538  Weight: 68 kg 70.9 kg 68 kg    Examination: General exam: Patient is chronically ill looking.  Patient appears weak.  Not in any distress.   HEENT: Patient is pale. Respiratory system: Clear to auscultation.  Cardiovascular system: S1 & S2 heard. Gastrointestinal system: Abdomen is soft and nontender.   Central nervous system: Awake and alert.  Patient moves all extremities.   Extremities: Edema.    Data Reviewed: I have personally reviewed following labs and imaging studies  CBC: Recent Labs  Lab 07/09/2023 1433 07/11/23 0519 07/12/23 1118 07/13/23 0318 07/13/23 1154 07/14/23 0308 07/25/2023 0323 07/13/2023 1812 07/16/23 0256  WBC 6.1   < > 5.4 6.1  --  7.1 6.9  --  7.4  NEUTROABS 5.5  --  4.8  --   --  6.3 6.0  --  6.3  HGB 10.8*   < > 9.2* 6.4* 8.6* 7.8* 7.1* 8.1* 8.9*  HCT 32.0*   < > 29.9* 20.0* 25.7* 23.4* 21.6* 24.7* 26.5*  MCV 91.7   < > 99.3 94.8  --  92.5 92.3  --  92.7  PLT 170   < > 183 116*  --  72* 69*  --  73*   < > = values in this interval not displayed.   Basic Metabolic Panel: Recent Labs  Lab 07/12/23 1118 07/12/23 1557 07/13/23 0318 07/14/23 0308 07/31/2023 0323 07/16/23 0256  NA 146*  --  146* 145 142 144  K 4.3  --  3.2* 3.0* 3.3* 3.5  CL 110  --  111 111 108 108  CO2 13*  --  26 25 26 29   GLUCOSE 302*  --  193* 134* 167* 150*  BUN 50*  --  39* 22 16 13   CREATININE 0.89  --  0.52* 0.40* 0.35* 0.39*  CALCIUM 7.7*  --  7.1* 6.7* 6.7* 6.5*  MG  --   --   --  1.8 1.6* 1.9  PHOS  --  1.2*  --  1.5* 1.4* 1.5*   GFR: Estimated Creatinine Clearance: 82.6 mL/min (A) (by C-G formula based on SCr of 0.39 mg/dL (L)). Liver Function Tests: Recent Labs  Lab 07/12/23 1118 07/13/23 0318 07/14/23 0308 07/21/2023 0323 07/16/23 0256  AST 239* 3,101* 1,065* 365* 166*  ALT 352* 3,140* 2,337* 1,564* 1,123*  ALKPHOS 96 74 81 104 120  BILITOT 1.0 0.9 0.9 0.5 0.7   PROT 5.2* 4.5* 4.3* 4.4* 4.6*  ALBUMIN 2.5* 2.1* 2.0* 1.9* 1.9*   No results for input(s): "LIPASE", "AMYLASE" in the last 168 hours. Recent Labs  Lab 07/17/2023 1433  AMMONIA 22   Coagulation Profile: Recent Labs  Lab 07/06/2023 1433 07/12/23 1118  INR 1.1 1.5*   Cardiac Enzymes: Recent Labs  Lab 07/12/23 0546  CKTOTAL 93   BNP (last 3 results) No results for input(s): "PROBNP" in the last 8760 hours. HbA1C: No results for input(s): "HGBA1C" in the last 72 hours. CBG: Recent Labs  Lab 07/23/2023 1600 07/17/2023 1710 07/20/2023 2129 07/16/23 0807 07/16/23 1131  GLUCAP 94 83 212* 147* 182*   Lipid Profile: No results for input(s): "CHOL", "HDL", "LDLCALC", "TRIG", "CHOLHDL", "LDLDIRECT" in the last 72 hours. Thyroid Function Tests: No  results for input(s): "TSH", "T4TOTAL", "FREET4", "T3FREE", "THYROIDAB" in the last 72 hours.  Anemia Panel: No results for input(s): "VITAMINB12", "FOLATE", "FERRITIN", "TIBC", "IRON", "RETICCTPCT" in the last 72 hours. Sepsis Labs: Recent Labs  Lab 07/12/23 1118 07/12/23 1215 07/12/23 1557 07/12/23 2149  LATICACIDVEN >9.0* >9.0* 8.1* 3.4*    Recent Results (from the past 240 hour(s))  Resp panel by RT-PCR (RSV, Flu A&B, Covid) Urine, Clean Catch     Status: None   Collection Time: August 03, 2023  1:30 PM   Specimen: Urine, Clean Catch; Nasal Swab  Result Value Ref Range Status   SARS Coronavirus 2 by RT PCR NEGATIVE NEGATIVE Final    Comment: (NOTE) SARS-CoV-2 target nucleic acids are NOT DETECTED.  The SARS-CoV-2 RNA is generally detectable in upper respiratory specimens during the acute phase of infection. The lowest concentration of SARS-CoV-2 viral copies this assay can detect is 138 copies/mL. A negative result does not preclude SARS-Cov-2 infection and should not be used as the sole basis for treatment or other patient management decisions. A negative result may occur with  improper specimen collection/handling, submission of  specimen other than nasopharyngeal swab, presence of viral mutation(s) within the areas targeted by this assay, and inadequate number of viral copies(<138 copies/mL). A negative result must be combined with clinical observations, patient history, and epidemiological information. The expected result is Negative.  Fact Sheet for Patients:  BloggerCourse.com  Fact Sheet for Healthcare Providers:  SeriousBroker.it  This test is no t yet approved or cleared by the [place] FDA and  has been authorized for detection and/or diagnosis of SARS-CoV-2 by FDA under an Emergency Use Authorization (EUA). This EUA will remain  in effect (meaning this test can be used) for the duration of the COVID-19 declaration under Section 564(b)(1) of the Act, 21 U.S.C.section 360bbb-3(b)(1), unless the authorization is terminated  or revoked sooner.       Influenza A by PCR NEGATIVE NEGATIVE Final   Influenza B by PCR NEGATIVE NEGATIVE Final    Comment: (NOTE) The Xpert Xpress SARS-CoV-2/FLU/RSV plus assay is intended as an aid in the diagnosis of influenza from Nasopharyngeal swab specimens and should not be used as a sole basis for treatment. Nasal washings and aspirates are unacceptable for Xpert Xpress SARS-CoV-2/FLU/RSV testing.  Fact Sheet for Patients: BloggerCourse.com  Fact Sheet for Healthcare Providers: SeriousBroker.it  This test is not yet approved or cleared by the [place] FDA and has been authorized for detection and/or diagnosis of SARS-CoV-2 by FDA under an Emergency Use Authorization (EUA). This EUA will remain in effect (meaning this test can be used) for the duration of the COVID-19 declaration under Section 564(b)(1) of the Act, 21 U.S.C. section 360bbb-3(b)(1), unless the authorization is terminated or revoked.     Resp Syncytial Virus by PCR NEGATIVE NEGATIVE Final     Comment: (NOTE) Fact Sheet for Patients: BloggerCourse.com  Fact Sheet for Healthcare Providers: SeriousBroker.it  This test is not yet approved or cleared by the [place] FDA and has been authorized for detection and/or diagnosis of SARS-CoV-2 by FDA under an Emergency Use Authorization (EUA). This EUA will remain in effect (meaning this test can be used) for the duration of the COVID-19 declaration under Section 564(b)(1) of the Act, 21 U.S.C. section 360bbb-3(b)(1), unless the authorization is terminated or revoked.  Performed at [facility], [address], [place], [place] [address]   Urine Culture (for pregnant, neutropenic or urologic patients or patients with an indwelling urinary catheter)  Status: Abnormal   Collection Time: 07/08/2023  1:30 PM   Specimen: Urine, Clean Catch  Result Value Ref Range Status   Specimen Description   Final    URINE, CLEAN CATCH Performed at [facility], [address], [place], [place] [address]    Special Requests   Final    NONE Performed at [facility], [address], [place], [place] [address]    Culture >=100,000 COLONIES/mL KLEBSIELLA OXYTOCA (A)  Final   Report Status 07/13/2023 FINAL  Final   Organism ID, Bacteria KLEBSIELLA OXYTOCA (A)  Final      Susceptibility   Klebsiella oxytoca - MIC*    AMPICILLIN >=32 RESISTANT Resistant     CEFEPIME <=0.12 SENSITIVE Sensitive     CEFTRIAXONE <=0.25 SENSITIVE Sensitive     CIPROFLOXACIN <=0.25 SENSITIVE Sensitive     GENTAMICIN <=1 SENSITIVE Sensitive     IMIPENEM <=0.25 SENSITIVE Sensitive     NITROFURANTOIN 32 SENSITIVE Sensitive     TRIMETH/SULFA <=20 SENSITIVE Sensitive     AMPICILLIN/SULBACTAM 4 SENSITIVE Sensitive     PIP/TAZO <=4 SENSITIVE Sensitive ug/mL    * >=100,000 COLONIES/mL KLEBSIELLA OXYTOCA  Culture, blood (x 2)     Status: None (Preliminary  result)   Collection Time: 07/12/23 11:18 AM   Specimen: BLOOD RIGHT HAND  Result Value Ref Range Status   Specimen Description   Final    BLOOD RIGHT HAND Performed at [facility], [address], [place], [place] [address]    Special Requests   Final    BOTTLES DRAWN AEROBIC AND ANAEROBIC Blood Culture adequate volume Performed at [facility], [address], [place], [place] [address]    Culture   Final    NO GROWTH 4 DAYS Performed at [facility], [address] [place], [place] [address]    Report Status PENDING  Incomplete  Culture, blood (x 2)     Status: None (Preliminary result)   Collection Time: 07/12/23 11:19 AM   Specimen: BLOOD RIGHT ARM  Result Value Ref Range Status   Specimen Description   Final    BLOOD RIGHT ARM Performed at [facility], [address] [place], [place] [address]    Special Requests   Final    BOTTLES DRAWN AEROBIC AND ANAEROBIC Blood Culture adequate volume Performed at [facility], [address], [place], [place] [address]    Culture   Final    NO GROWTH 4 DAYS Performed at [facility], [address] [place], [place] [address]    Report Status PENDING  Incomplete  MRSA Next Gen by PCR, Nasal     Status: None   Collection Time: 07/12/23  3:46 PM   Specimen: Nasal Mucosa; Nasal Swab  Result Value Ref Range Status   MRSA by PCR Next Gen NOT DETECTED NOT DETECTED Final    Comment: (NOTE) The GeneXpert MRSA Assay (FDA approved for NASAL specimens only), is one component of a comprehensive MRSA colonization surveillance program. It is not intended to diagnose MRSA infection nor to guide or monitor treatment for MRSA infections. Test performance is not FDA approved in patients less than 28 years old. Performed at [facility], [address] [place], [place] [address]          Radiology Studies: No results found.      Scheduled Meds:   sodium chloride   Intravenous Once  CARBOplatin  470 mg Intravenous Once   Chlorhexidine Gluconate Cloth  6 each Topical Daily   docusate sodium  100 mg Oral BID   dorzolamide  1 drop Right Eye BID   enoxaparin (LOVENOX) injection  40 mg Subcutaneous Daily   etoposide  80 mg/m2 (Treatment Plan Recorded) Intravenous Once   feeding supplement (GLUCERNA SHAKE)  237 mL Oral BID BM   insulin aspart  0-15 Units Subcutaneous TID WC   insulin aspart  0-5 Units Subcutaneous QHS   insulin glargine-yfgn  12 Units Subcutaneous Daily   mouth rinse  15 mL Mouth Rinse 4 times per day   palonosetron  0.25 mg Intravenous Once   pantoprazole (PROTONIX) IV  40 mg Intravenous Q12H   sodium chloride flush  3 mL Intravenous Q12H   timolol  1 drop Right Eye BID   Continuous Infusions:  ampicillin-sulbactam (UNASYN) IV Stopped (07/16/23 1405)   dexamethasone (DECADRON) IVPB ([facility])     fosaprepitant (EMEND) 150 mg in sodium chloride 0.9 % 145 mL IVPB       LOS: 4 days    Time spent: 55 minutes    Barnetta Chapel, MD [facility]

## 2023-07-16 NOTE — Plan of Care (Signed)
Problem: Education: Goal: Ability to describe self-care measures that may prevent or decrease complications (Diabetes Survival Skills Education) will improve Outcome: Progressing   Problem: Coping: Goal: Ability to adjust to condition or change in health will improve Outcome: Progressing   Problem: Health Behavior/Discharge Planning: Goal: Ability to identify and utilize available resources and services will improve Outcome: Progressing

## 2023-07-16 NOTE — Plan of Care (Signed)
  Problem: Education: Goal: Ability to describe self-care measures that may prevent or decrease complications (Diabetes Survival Skills Education) will improve 07/16/2023 1926 by Beatrix Shipper, RN Outcome: Progressing 07/16/2023 1902 by Beatrix Shipper, RN Outcome: Progressing Goal: Individualized Educational Video(s) Outcome: Progressing   Problem: Coping: Goal: Ability to adjust to condition or change in health will improve Outcome: Progressing   Problem: Fluid Volume: Goal: Ability to maintain a balanced intake and output will improve Outcome: Progressing   Problem: Health Behavior/Discharge Planning: Goal: Ability to identify and utilize available resources and services will improve Outcome: Progressing   Problem: Nutritional: Goal: Maintenance of adequate nutrition will improve Outcome: Progressing

## 2023-07-16 NOTE — Plan of Care (Signed)
  Problem: Education: Goal: Ability to describe self-care measures that may prevent or decrease complications (Diabetes Survival Skills Education) will improve Outcome: Progressing Goal: Individualized Educational Video(s) Outcome: Progressing   Problem: Coping: Goal: Ability to adjust to condition or change in health will improve Outcome: Progressing   Problem: Fluid Volume: Goal: Ability to maintain a balanced intake and output will improve Outcome: Progressing   Problem: Health Behavior/Discharge Planning: Goal: Ability to identify and utilize available resources and services will improve Outcome: Progressing Goal: Ability to manage health-related needs will improve Outcome: Progressing   Problem: Metabolic: Goal: Ability to maintain appropriate glucose levels will improve Outcome: Progressing   Problem: Nutritional: Goal: Maintenance of adequate nutrition will improve Outcome: Progressing Goal: Progress toward achieving an optimal weight will improve Outcome: Progressing   Problem: Skin Integrity: Goal: Risk for impaired skin integrity will decrease Outcome: Progressing   Problem: Tissue Perfusion: Goal: Adequacy of tissue perfusion will improve Outcome: Progressing   Problem: Education: Goal: Knowledge of General Education information will improve Description: Including pain rating scale, medication(s)/side effects and non-pharmacologic comfort measures Outcome: Progressing   Problem: Health Behavior/Discharge Planning: Goal: Ability to manage health-related needs will improve Outcome: Progressing   Problem: Clinical Measurements: Goal: Ability to maintain clinical measurements within normal limits will improve Outcome: Progressing Goal: Will remain free from infection Outcome: Progressing Goal: Diagnostic test results will improve Outcome: Progressing Goal: Respiratory complications will improve Outcome: Progressing Goal: Cardiovascular complication will  be avoided Outcome: Progressing   Problem: Activity: Goal: Risk for activity intolerance will decrease Outcome: Progressing   Problem: Nutrition: Goal: Adequate nutrition will be maintained Outcome: Progressing   Problem: Coping: Goal: Level of anxiety will decrease Outcome: Progressing   Problem: Elimination: Goal: Will not experience complications related to bowel motility Outcome: Progressing Goal: Will not experience complications related to urinary retention Outcome: Progressing   Problem: Pain Managment: Goal: General experience of comfort will improve Outcome: Progressing   Problem: Safety: Goal: Ability to remain free from injury will improve Outcome: Progressing   Problem: Skin Integrity: Goal: Risk for impaired skin integrity will decrease Outcome: Progressing   Problem: Fluid Volume: Goal: Hemodynamic stability will improve Outcome: Progressing   Problem: Clinical Measurements: Goal: Diagnostic test results will improve Outcome: Progressing Goal: Signs and symptoms of infection will decrease Outcome: Progressing   Problem: Respiratory: Goal: Ability to maintain adequate ventilation will improve Outcome: Progressing   Problem: Education: Goal: Knowledge of the prescribed therapeutic regimen will improve Outcome: Progressing   Problem: Activity: Goal: Ability to implement measures to reduce episodes of fatigue will improve Outcome: Progressing   Problem: Bowel/Gastric: Goal: Will not experience complications related to bowel motility Outcome: Progressing   Problem: Coping: Goal: Ability to identify and develop effective coping behavior will improve Outcome: Progressing   Problem: Nutritional: Goal: Maintenance of adequate nutrition will improve Outcome: Progressing

## 2023-07-17 DIAGNOSIS — D62 Acute posthemorrhagic anemia: Secondary | ICD-10-CM

## 2023-07-17 DIAGNOSIS — K921 Melena: Secondary | ICD-10-CM | POA: Diagnosis not present

## 2023-07-17 DIAGNOSIS — R579 Shock, unspecified: Secondary | ICD-10-CM | POA: Diagnosis not present

## 2023-07-17 LAB — CBC WITH DIFFERENTIAL/PLATELET
Abs Immature Granulocytes: 0.16 10*3/uL — ABNORMAL HIGH (ref 0.00–0.07)
Basophils Absolute: 0 10*3/uL (ref 0.0–0.1)
Basophils Relative: 0 %
Eosinophils Absolute: 0 10*3/uL (ref 0.0–0.5)
Eosinophils Relative: 0 %
HCT: 25.8 % — ABNORMAL LOW (ref 39.0–52.0)
Hemoglobin: 8.7 g/dL — ABNORMAL LOW (ref 13.0–17.0)
Immature Granulocytes: 2 %
Lymphocytes Relative: 6 %
Lymphs Abs: 0.5 10*3/uL — ABNORMAL LOW (ref 0.7–4.0)
MCH: 31.4 pg (ref 26.0–34.0)
MCHC: 33.7 g/dL (ref 30.0–36.0)
MCV: 93.1 fL (ref 80.0–100.0)
Monocytes Absolute: 0.5 10*3/uL (ref 0.1–1.0)
Monocytes Relative: 7 %
Neutro Abs: 6.4 10*3/uL (ref 1.7–7.7)
Neutrophils Relative %: 85 %
Platelets: 77 10*3/uL — ABNORMAL LOW (ref 150–400)
RBC: 2.77 MIL/uL — ABNORMAL LOW (ref 4.22–5.81)
RDW: 14.9 % (ref 11.5–15.5)
WBC: 7.6 10*3/uL (ref 4.0–10.5)
nRBC: 2.5 % — ABNORMAL HIGH (ref 0.0–0.2)

## 2023-07-17 LAB — COMPREHENSIVE METABOLIC PANEL
ALT: 776 U/L — ABNORMAL HIGH (ref 0–44)
AST: 95 U/L — ABNORMAL HIGH (ref 15–41)
Albumin: 1.9 g/dL — ABNORMAL LOW (ref 3.5–5.0)
Alkaline Phosphatase: 139 U/L — ABNORMAL HIGH (ref 38–126)
Anion gap: 9 (ref 5–15)
BUN: 13 mg/dL (ref 8–23)
CO2: 27 mmol/L (ref 22–32)
Calcium: 6.5 mg/dL — ABNORMAL LOW (ref 8.9–10.3)
Chloride: 108 mmol/L (ref 98–111)
Creatinine, Ser: 0.45 mg/dL — ABNORMAL LOW (ref 0.61–1.24)
GFR, Estimated: >60 mL/min (ref >60)
Glucose, Bld: 143 mg/dL — ABNORMAL HIGH (ref 70–99)
Potassium: 3.1 mmol/L — ABNORMAL LOW (ref 3.5–5.1)
Sodium: 144 mmol/L (ref 135–145)
Total Bilirubin: 0.7 mg/dL (ref 0.3–1.2)
Total Protein: 4.6 g/dL — ABNORMAL LOW (ref 6.5–8.1)

## 2023-07-17 LAB — HEMOGLOBIN AND HEMATOCRIT, BLOOD
HCT: 28.7 % — ABNORMAL LOW (ref 39.0–52.0)
HCT: 28.9 % — ABNORMAL LOW (ref 39.0–52.0)
Hemoglobin: 9.6 g/dL — ABNORMAL LOW (ref 13.0–17.0)
Hemoglobin: 9.4 g/dL — ABNORMAL LOW (ref 13.0–17.0)

## 2023-07-17 LAB — RENAL FUNCTION PANEL
Albumin: 1.9 g/dL — ABNORMAL LOW (ref 3.5–5.0)
Anion gap: 8 (ref 5–15)
BUN: 14 mg/dL (ref 8–23)
CO2: 28 mmol/L (ref 22–32)
Calcium: 6.5 mg/dL — ABNORMAL LOW (ref 8.9–10.3)
Chloride: 108 mmol/L (ref 98–111)
Creatinine, Ser: 0.4 mg/dL — ABNORMAL LOW (ref 0.61–1.24)
GFR, Estimated: >60 mL/min (ref >60)
Glucose, Bld: 145 mg/dL — ABNORMAL HIGH (ref 70–99)
Phosphorus: 1.2 mg/dL — ABNORMAL LOW (ref 2.5–4.6)
Potassium: 3 mmol/L — ABNORMAL LOW (ref 3.5–5.1)
Sodium: 144 mmol/L (ref 135–145)

## 2023-07-17 LAB — MAGNESIUM: Magnesium: 1.7 mg/dL (ref 1.7–2.4)

## 2023-07-17 LAB — CULTURE, BLOOD (ROUTINE X 2)
Culture: NO GROWTH
Culture: NO GROWTH
Special Requests: ADEQUATE
Special Requests: ADEQUATE

## 2023-07-17 LAB — GLUCOSE, CAPILLARY
Glucose-Capillary: 128 mg/dL — ABNORMAL HIGH (ref 70–99)
Glucose-Capillary: 196 mg/dL — ABNORMAL HIGH (ref 70–99)
Glucose-Capillary: 191 mg/dL — ABNORMAL HIGH (ref 70–99)
Glucose-Capillary: 88 mg/dL (ref 70–99)

## 2023-07-17 LAB — PROTIME-INR
INR: 1.1 (ref 0.8–1.2)
Prothrombin Time: 13.9 s (ref 11.4–15.2)

## 2023-07-17 LAB — PHOSPHORUS: Phosphorus: 1.2 mg/dL — ABNORMAL LOW (ref 2.5–4.6)

## 2023-07-17 MED ORDER — PANTOPRAZOLE SODIUM 40 MG PO TBEC
40.0000 mg | DELAYED_RELEASE_TABLET | Freq: Two times a day (BID) | ORAL | Status: DC
  Administered 2023-07-17 – 2023-07-28 (×15): 40 mg via ORAL
  Filled 2023-07-17 (×22): qty 1

## 2023-07-17 MED ORDER — POTASSIUM CHLORIDE 20 MEQ PO PACK
40.0000 meq | PACK | ORAL | Status: AC
  Administered 2023-07-17 (×2): 40 meq via ORAL
  Filled 2023-07-17 (×2): qty 2

## 2023-07-17 MED ORDER — MAGNESIUM SULFATE 2 GM/50ML IV SOLN
2.0000 g | Freq: Once | INTRAVENOUS | Status: AC
  Administered 2023-07-17: 2 g via INTRAVENOUS
  Filled 2023-07-17: qty 50

## 2023-07-17 MED ORDER — POTASSIUM PHOSPHATES 15 MMOLE/5ML IV SOLN
30.0000 mmol | Freq: Once | INTRAVENOUS | Status: AC
  Administered 2023-07-17: 30 mmol via INTRAVENOUS
  Filled 2023-07-17: qty 10

## 2023-07-17 MED ORDER — SODIUM CHLORIDE 0.9 % IV SOLN
3.0000 g | Freq: Four times a day (QID) | INTRAVENOUS | Status: AC
  Administered 2023-07-17 – 2023-07-18 (×3): 3 g via INTRAVENOUS
  Filled 2023-07-17 (×3): qty 8

## 2023-07-17 NOTE — Plan of Care (Signed)

## 2023-07-17 NOTE — Progress Notes (Signed)
[facility] Progress Note  Chief Complaint: Hematochezia  History: I was called and asked to reevaluate this patient of Dr. Elnoria Howard for hematochezia that occurred this morning.  From Dr. Haywood Pao initial consult note of 07/14/2023: "HPI: This is a 72 year old male with a PMH of prostate cancer, recent diagnosis of metastatic small cell carcinoma of the liver, HTN, hyperlipidemia, and RAVE (radiation proctitis) admitted for weakness.  The patient was recently hospitalized in September for complaints of melenic stools and anemia.  At that time his HGB was at 5.8 g/dL.  Dr. Adela Lank performed an EGD with findings of a Forrest III gastric ulcer.  The gastric biopsies were negative for H pylori.  He was also diagnosed with small cell carcinoma of the liver with a needle biopsy.  While at home he felt weak and he presented to the ER.  There were no specific findings for his weakness and there was the possibility of an UTI and/or weakness from [place].  He was taking his pantoprazole for the gastric ulcer and his HGB was relatively stable.  During this admission, on 07/12/2023 his BP dropped and he also had melena.  The HGB dropped down to 6.4 g/dL on 16/10/958.  Per his report he had two episodes of melena.  Currently his HGB is relatively stable at 7.8 g/dL.  He denies any abdominal pain, nausea, vomiting, or hematemesis.  The patient was also identified to have a marked elevation in his liver enzymes.  This correlated with his hypotension and the values are improving.  The patient was treated with antibiotics for the possibility of sepsis. "  EGD 07/15/2023 showed healing of the ulcer, some residual dispersed gastric erosions, no fresh or old blood in the upper GI tract.  This patient's nurse reported that he passed bright red blood earlier this morning.  Patient denies abdominal pain chest pain or dyspnea.  It is difficult to get a clear description from him on any GI bleeding that he had at home prior to  admission, because he keeps replying "it depends on what I eat". Hemoglobin earlier this morning (before the bleeding) was unchanged from yesterday's value. Patient has been on Lovenox, last dose this morning, and since discontinued after the nurse's conversation with the hospitalist. Repeat hemoglobin and hematocrit was about to be drawn as I was finished seeing this man, and I asked that an INR be added as well.  (His last known INR was 1.5 on 07/12/2023, having been normal at 1.12 days prior to that) ROS: Recent weight loss Remainder of GI systems negative except as noted above  Objective:   Current Facility-Administered Medications:    0.9 %  sodium chloride infusion (Manually program via Guardrails IV Fluids), , Intravenous, Once, Jeani Hawking, MD   acetaminophen (TYLENOL) tablet 650 mg, 650 mg, Oral, Q6H PRN **OR** acetaminophen (TYLENOL) suppository 650 mg, 650 mg, Rectal, Q6H PRN, Jeani Hawking, MD   albuterol (PROVENTIL) (2.5 MG/3ML) 0.083% nebulizer solution 2.5 mg, 2.5 mg, Nebulization, Q2H PRN, Jeani Hawking, MD   Ampicillin-Sulbactam (UNASYN) 3 g in sodium chloride 0.9 % 100 mL IVPB, 3 g, Intravenous, Q6H, Ogbata, Sylvester I, MD, Last Rate: 200 mL/hr at 07/17/23 1206, 3 g at 07/17/23 1206   bisacodyl (DULCOLAX) EC tablet 5 mg, 5 mg, Oral, Daily PRN, Jeani Hawking, MD   CARBOplatin (PARAPLATIN) 470 mg in sodium chloride 0.9 % 250 mL chemo infusion, 470 mg, Intravenous, Once, Jeani Hawking, MD   Chlorhexidine Gluconate Cloth 2 % PADS 6 each,  6 each, Topical, Daily, Jeani Hawking, MD, 6 each at 07/16/23 2100   dexamethasone (DECADRON) 10 mg in sodium chloride 0.9 % 50 mL IVPB, 10 mg, Intravenous, Once, Jeani Hawking, MD   docusate sodium (COLACE) capsule 100 mg, 100 mg, Oral, BID, Jeani Hawking, MD, 100 mg at 07/15/23 2157   dorzolamide (TRUSOPT) 2 % ophthalmic solution 1 drop, 1 drop, Right Eye, BID, Jeani Hawking, MD, 1 drop at 07/17/23 1049   enoxaparin (LOVENOX) injection 40 mg,  40 mg, Subcutaneous, Daily, [place], Amy S, PA-C, 40 mg at 07/16/23 1241   etoposide (VEPESID) 148 mg in sodium chloride 0.9 % 500 mL chemo infusion, 80 mg/m2 (Treatment Plan Recorded), Intravenous, Once, Jeani Hawking, MD   feeding supplement (GLUCERNA SHAKE) (GLUCERNA SHAKE) liquid 237 mL, 237 mL, Oral, BID BM, Jeani Hawking, MD, 237 mL at 07/16/23 0948   fosaprepitant (EMEND) 150 mg in sodium chloride 0.9 % 145 mL IVPB, 150 mg, Intravenous, Once, Jeani Hawking, MD   hydrALAZINE (APRESOLINE) injection 5 mg, 5 mg, Intravenous, Q4H PRN, Jeani Hawking, MD, 5 mg at 07/17/23 0007   insulin aspart (novoLOG) injection 0-15 Units, 0-15 Units, Subcutaneous, TID WC, Jeani Hawking, MD, 3 Units at 07/17/23 1201   insulin aspart (novoLOG) injection 0-5 Units, 0-5 Units, Subcutaneous, QHS, Jeani Hawking, MD, 2 Units at 07/15/23 2155   insulin glargine-yfgn (SEMGLEE) injection 12 Units, 12 Units, Subcutaneous, Daily, Jeani Hawking, MD, 12 Units at 07/17/23 1100   ondansetron (ZOFRAN) tablet 4 mg, 4 mg, Oral, Q6H PRN **OR** ondansetron (ZOFRAN) injection 4 mg, 4 mg, Intravenous, Q6H PRN, Jeani Hawking, MD   Oral care mouth rinse, 15 mL, Mouth Rinse, 4 times per day, Jeani Hawking, MD, 15 mL at 07/17/23 0800   Oral care mouth rinse, 15 mL, Mouth Rinse, PRN, Jeani Hawking, MD   oxyCODONE (Oxy IR/ROXICODONE) immediate release tablet 5 mg, 5 mg, Oral, Q4H PRN, Jeani Hawking, MD   palonosetron (ALOXI) injection 0.25 mg, 0.25 mg, Intravenous, Once, Jeani Hawking, MD   pantoprazole (PROTONIX) injection 40 mg, 40 mg, Intravenous, Q12H, Jeani Hawking, MD, 40 mg at 07/17/23 1050   phosphorus (K PHOS NEUTRAL) tablet 250 mg, 250 mg, Oral, TID, Berton Mount I, MD, 250 mg at 07/17/23 1049   polyethylene glycol (MIRALAX / GLYCOLAX) packet 17 g, 17 g, Oral, Daily PRN, Jeani Hawking, MD   sodium chloride flush (NS) 0.9 % injection 3 mL, 3 mL, Intravenous, Q12H, Jeani Hawking, MD, 3 mL at 07/17/23 1053   timolol (TIMOPTIC)  0.5 % ophthalmic solution 1 drop, 1 drop, Right Eye, BID, Jeani Hawking, MD, 1 drop at 07/17/23 1048   ampicillin-sulbactam (UNASYN) IV 3 g (07/17/23 1206)   dexamethasone (DECADRON) IVPB ([facility])     fosaprepitant (EMEND) 150 mg in sodium chloride 0.9 % 145 mL IVPB      Past Medical History:  Diagnosis Date   Angiodysplasia of colon with hemorrhage 10/14/2020   Duodenitis    Essential hypertension 07/16/2015   Fatigue 11/06/2020   Glaucoma    H/O: substance abuse (HCC)    Hemorrhage of rectum and anus 10/14/2020   History of frostbite    History of prostate cancer 03/20/2014   Lost to f/u with [facility] in the past 2011 with elevated PSA >30 at that time.  Saw [facility] (Dr. Isabel Caprice) on 03/13/14. Cancer Noted 03/13/14 office visit. Gleason score 7. Plan CT Ab/pelvis with contrast, bone scan    Hyperlipidemia 11/20/2019   Iron deficiency anemia    Malignant tumor  of prostate (HCC) 10/14/2020   Peripheral neuropathy    S/P radiation therapy 09/18/14-11/15/14   prostate/ [id number]   Thrombocytopenia [facility])    Past Surgical History:  Procedure Laterality Date   BIOPSY  06/11/2023   Procedure: BIOPSY;  Surgeon: Benancio Deeds, MD;  Location: [facility] ENDOSCOPY;  Service: Gastroenterology;;   COLONOSCOPY N/A 01/10/2014   Procedure: COLONOSCOPY;  Surgeon: Theda Belfast, MD;  Location: [facility] ENDOSCOPY;  Service: Endoscopy;  Laterality: N/A;   ESOPHAGOGASTRODUODENOSCOPY N/A 01/10/2014   Procedure: ESOPHAGOGASTRODUODENOSCOPY (EGD);  Surgeon: Theda Belfast, MD;  Location: [facility] ENDOSCOPY;  Service: Endoscopy;  Laterality: N/A;   ESOPHAGOGASTRODUODENOSCOPY (EGD) WITH PROPOFOL N/A 06/11/2023   Procedure: ESOPHAGOGASTRODUODENOSCOPY (EGD) WITH PROPOFOL;  Surgeon: Benancio Deeds, MD;  Location: [facility] ENDOSCOPY;  Service: Gastroenterology;  Laterality: N/A;   FLEXIBLE SIGMOIDOSCOPY N/A 09/19/2015   Procedure: FLEXIBLE SIGMOIDOSCOPY;  Surgeon: Jeani Hawking, MD;  Location: [facility] ENDOSCOPY;   Service: Endoscopy;  Laterality: N/A;   FLEXIBLE SIGMOIDOSCOPY N/A 06/03/2017   Procedure: FLEXIBLE SIGMOIDOSCOPY;  Surgeon: Jeani Hawking, MD;  Location: [facility] ENDOSCOPY;  Service: Endoscopy;  Laterality: N/A;   FLEXIBLE SIGMOIDOSCOPY N/A 01/06/2018   Procedure: FLEXIBLE SIGMOIDOSCOPY;  Surgeon: Jeani Hawking, MD;  Location: [facility] ENDOSCOPY;  Service: Endoscopy;  Laterality: N/A;   GIVENS CAPSULE STUDY N/A 01/10/2014   Procedure: GIVENS CAPSULE STUDY;  Surgeon: Theda Belfast, MD;  Location: [facility] ENDOSCOPY;  Service: Endoscopy;  Laterality: N/A;   HOT HEMOSTASIS N/A 09/19/2015   Procedure: HOT HEMOSTASIS (ARGON PLASMA COAGULATION/BICAP);  Surgeon: Jeani Hawking, MD;  Location: [facility] ENDOSCOPY;  Service: Endoscopy;  Laterality: N/A;   HOT HEMOSTASIS N/A 06/03/2017   Procedure: HOT HEMOSTASIS (ARGON PLASMA COAGULATION/BICAP);  Surgeon: Jeani Hawking, MD;  Location: [facility] ENDOSCOPY;  Service: Endoscopy;  Laterality: N/A;   PROSTATE BIOPSY  03/05/14   Gleason 7, vol 45 gm   Reports of multiple prior sigmoidoscopies reviewed because of intermittent lower GI bleeding.  He has had variable degrees of radiation proctitis and internal hemorrhoids found on multiple sigmoidoscopies.  Last full colonoscopy was April 2015.  Vital signs in last 24 hrs: Vitals:   07/17/23 0800 07/17/23 0817  BP: (!) 175/97   Pulse: 84   Resp: 19   Temp:  97.7 F (36.5 C)  SpO2: 95%     Intake/Output Summary (Last 24 hours) at 07/17/2023 1320 Last data filed at 07/17/2023 0800 Gross per 24 hour  Intake 407.37 ml  Output 300 ml  Net 107.37 ml     Physical Exam Patient laying comfortably tachycardic to 105, looks well, alert and conversational, watching television.  Limited historian. HEENT: sclera anicteric, oral mucosa without lesions Neck: supple, no thyromegaly, JVD or lymphadenopathy Cardiac: RRR without murmurs, Pulm: clear to auscultation bilaterally, normal RR and effort noted Abdomen: soft, no tenderness, with active  bowel sounds.  Skin; warm and dry, no jaundice  Recent Labs:     Latest Ref Rng & Units 07/17/2023   11:51 AM 07/17/2023    3:08 AM 07/16/2023    2:56 AM  CBC  WBC 4.0 - 10.5 K/uL  7.6  7.4   Hemoglobin 13.0 - 17.0 g/dL 9.6  8.7  8.9   Hematocrit 39.0 - 52.0 % 28.7  25.8  26.5   Platelets 150 - 400 K/uL  77  73     Recent Labs  Lab 07/17/23 1151  INR 1.1      Latest Ref Rng & Units 07/17/2023    3:09 AM 07/17/2023    3:08  AM 07/16/2023    2:56 AM  CMP  Glucose 70 - 99 mg/dL 161  096  045   BUN 8 - 23 mg/dL 14  13  13    Creatinine 0.61 - 1.24 mg/dL 4.09  8.11  9.14   Sodium 135 - 145 mmol/L 144  144  144   Potassium 3.5 - 5.1 mmol/L 3.0  3.1  3.5   Chloride 98 - 111 mmol/L 108  108  108   CO2 22 - 32 mmol/L 28  27  29    Calcium 8.9 - 10.3 mg/dL 6.5  6.5  6.5   Total Protein 6.5 - 8.1 g/dL  4.6  4.6   Total Bilirubin 0.3 - 1.2 mg/dL  0.7  0.7   Alkaline Phos 38 - 126 U/L  139  120   AST 15 - 41 U/L  95  166   ALT 0 - 44 U/L  776  1,123      Radiologic studies:   Assessment & Plan  Assessment: Admitted with melena and acute on chronic blood loss anemia.  Additional diagnoses of his metastatic small cell liver cancer also contributing to underlying anemia.  With his unrevealing EGD this admission, it does not appear that a source of melena has yet been discovered.  Now he has bright red blood per rectum, though he is hemodynamically stable.  Tachycardic, hypertensive, abdominal exam benign, hemoglobin increased on the most recent value drawn after the bleeding occurred.  Known to have radiation proctitis and internal hemorrhoids, so that could be a potential source exacerbated by Lovenox that is since been discontinued.  Consider possibility of a right colon source since he was admitted with melena.   Plan: Full liquid diet with additional liquid protein calorie supplements.  [facility] physician has ordered every 8 hour hemoglobin and hematocrit  We will  continue to monitor him, no emergent need for endoscopic testing.  It seems likely he will need sigmoidoscopy or colonoscopy this admission by Dr. Elnoria Howard, we will return to service on this patient tomorrow.   45 minutes were spent on this encounter (including chart review, history/exam, counseling/coordination of care, and documentation) > 50% of that time was spent on counseling and coordination of care.  (Extensive chart review required on this patient I had not previously seen and not signed out to [place].)  Charlie Pitter III Office: [phone]

## 2023-07-17 NOTE — Progress Notes (Signed)
PROGRESS NOTE    Jatayvion Mongiello [facility]  YQM:[id number] DOB: [birth year] DOA: Jul 24, 2023 PCP: Morene Crocker, MD    Brief Narrative:  The patient is a 72 year old African-American male with past medical history significant for prostate cancer, hypertension, hyperlipidemia and glaucoma.  Patient was admitted for generalized weakness.  Patient was found to have new metastatic neuroendocrine tumor to liver and adrenals.    24-Jul-2023: Started on CTX, admitted for weakness, Xtandi held 10/7: Still weak but stable, Oncology consulted, planned for inpatient chemo 10/8: Patient acutely tachycardic, weak --> Lactate >9 --> transferred to ICU and antibiotics broadened, CCM consulted  10/9: Patient with hemoglobin less than 7.  Has history of bleeding gastric ulcer and 2 units of blood transfusion a month ago.  He was not able to continue Protonix with suspected allergy. Stabilizing, for EGD.  07/16/2023: Patient seen.  No new complaints.  Poor historian.  Patient underwent EGD on 07/06/2023 that revealed normal esophagus, erosive gastropathy with no stigmata of recent bleeding, normal examined duodenum.  No specimens were collected.  07/17/2023: Patient seen alongside patient's nurse.  Bright red rectal bleeding was reported earlier.  Subcutaneous Lovenox is on hold.  H/H every 8 hours.  Patient has history of radiation proctitis and internal hemorrhoids.  GI input is appreciated.  Possible sigmoidoscopy versus colonoscopy prior to discharge.  GI is directing.  No new complaints today.  No hypotension.  No significant tachycardia.  Labs reviewed today 07/17/2023: Chemistry reveals sodium of 144, potassium of 3, chloride 108, CO2 28, BUN of 14, serum creatinine of 0.4 with blood sugar of 145.  Calcium is 6.5 (corrected calcium of about 8.1), phosphorus of 1.2, albumin of 1.9 and GFR of greater than 60 mL/min per 1.73 m.  Hemoglobin ranges from 8.7 to 9.6 g/dL.   Assessment & Plan: Septic shock and  lactic acidosis (as per prior documentation): -No sepsis physiology noted.   -Urine culture grew Klebsiella oxytoca.  Blood culture has not grown any organisms. -Patient is on IV Unasyn 3 g every 6 hours.  Patient was previously on vancomycin and meropenem. -CT angiogram of the chest, CT scan abdomen pelvis 10/8 that showed multifocal pneumonia, possibly, atypical organism; Innumerable liver masses consistent with metastatic disease, multiple adrenal masses and Pancreatic edema.  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   -Oncology following.   -Unlikely ready for chemotherapy with current level of weakness, serious illness requiring ICU admission.   -Likely discharge to SNF and come back for chemotherapy.  Hypokalemia: -Potassium is 3 today. -Will order IV potassium phosphate, as well as oral KCl.  Hypophosphatemia: -Phosphorus of 1.2. -Will order IV potassium phosphate. -Continue to monitor electrolytes.    Hypomagnesemia: -Magnesium of 1.7 today. -Will order IV magnesium 2 g x 1 dose.    -Continue to monitor closely.  Anemia: -Likely multifactorial. -Patient underwent EGD on 07/30/2023 (see above documentation). -Hemoglobin of 8.7-9.6 g/dL today. -Rectal bleeding reported today (bright red blood). -Hold subcutaneous Lovenox. -H/H every 8 hours. -GI input is highly appreciated.   -Prior history of gastric ulcer documented.   Abnormal liver function test, transaminitis: -See above documentation.  Essential hypertension: -Continue to optimize. -Last documented blood pressure was 148/80 mmHg.    Prostate cancer:  -[name] is on hold for now.   DVT prophylaxis: Subcutaneous Lovenox is on hold.  Code Status: Full code Family Communication: None today. Disposition Plan: Status is: Inpatient Remains inpatient appropriate because: IV antibiotics debility, acute infection, GI bleeding  Consultants:  Oncology Critical  care GI  Procedures:  EGD  Antimicrobials:  Vancomycin meropenem 10/8--- 10/10 Unasyn 10/10---   Subjective: -Bright red blood per rectum reported earlier today.  Objective: Vitals:   07/17/23 0700 07/17/23 0800 07/17/23 0817 07/17/23 1359  BP: (!) 160/77 (!) 175/97    Pulse: 81 84    Resp: 18 19    Temp:   97.7 F (36.5 C) 97.6 F (36.4 C)  TempSrc:   Axillary Axillary  SpO2: 100% 95%    Weight:      Height:        Intake/Output Summary (Last 24 hours) at 07/17/2023 1509 Last data filed at 07/17/2023 0800 Gross per 24 hour  Intake 407.37 ml  Output 300 ml  Net 107.37 ml   Filed Weights   Jul 17, 2023 1145 07/12/23 1545 07/28/2023 1538  Weight: 68 kg 70.9 kg 68 kg    Examination: General exam: Patient is chronically ill looking.  Patient appears weak.  Not in any distress.   HEENT: Patient is pale. Respiratory system: Clear to auscultation.  Cardiovascular system: S1 & S2 heard. Gastrointestinal system: Abdomen is soft and nontender.   Central nervous system: Awake and alert.  Patient moves all extremities.   Extremities: Edema is improving.   Data Reviewed: I have personally reviewed following labs and imaging studies  CBC: Recent Labs  Lab 07/12/23 1118 07/13/23 0318 07/13/23 1154 07/14/23 0308 07/18/2023 0323 07/09/2023 1812 07/16/23 0256 07/17/23 0308 07/17/23 1151  WBC 5.4 6.1  --  7.1 6.9  --  7.4 7.6  --   NEUTROABS 4.8  --   --  6.3 6.0  --  6.3 6.4  --   HGB 9.2* 6.4*   < > 7.8* 7.1* 8.1* 8.9* 8.7* 9.6*  HCT 29.9* 20.0*   < > 23.4* 21.6* 24.7* 26.5* 25.8* 28.7*  MCV 99.3 94.8  --  92.5 92.3  --  92.7 93.1  --   PLT 183 116*  --  72* 69*  --  73* 77*  --    < > = values in this interval not displayed.   Basic Metabolic Panel: Recent Labs  Lab 07/14/23 0308 08/02/2023 0323 07/16/23 0256 07/17/23 0308 07/17/23 0309  NA 145 142 144 144 144  K 3.0* 3.3* 3.5 3.1* 3.0*  CL 111 108 108 108 108  CO2 25 26 29 27 28   GLUCOSE 134* 167* 150* 143*  145*  BUN 22 16 13 13 14   CREATININE 0.40* 0.35* 0.39* 0.45* 0.40*  CALCIUM 6.7* 6.7* 6.5* 6.5* 6.5*  MG 1.8 1.6* 1.9 1.7  --   PHOS 1.5* 1.4* 1.5* 1.2* 1.2*   GFR: Estimated Creatinine Clearance: 82.6 mL/min (A) (by C-G formula based on SCr of 0.4 mg/dL (L)). Liver Function Tests: Recent Labs  Lab 07/13/23 0318 07/14/23 0308 07/06/2023 0323 07/16/23 0256 07/17/23 0308 07/17/23 0309  AST 3,101* 1,065* 365* 166* 95*  --   ALT 3,140* 2,337* 1,564* 1,123* 776*  --   ALKPHOS 74 81 104 120 139*  --   BILITOT 0.9 0.9 0.5 0.7 0.7  --   PROT 4.5* 4.3* 4.4* 4.6* 4.6*  --   ALBUMIN 2.1* 2.0* 1.9* 1.9* 1.9* 1.9*   No results for input(s): "LIPASE", "AMYLASE" in the last 168 hours. No results for input(s): "AMMONIA" in the last 168 hours.  Coagulation Profile: Recent Labs  Lab 07/12/23 1118 07/17/23 1151  INR 1.5* 1.1   Cardiac Enzymes: Recent Labs  Lab 07/12/23 0546  CKTOTAL 93   BNP (last 3 results) No results for input(s): "PROBNP" in the last 8760 hours. HbA1C: No results for input(s): "HGBA1C" in the last 72 hours. CBG: Recent Labs  Lab 07/16/23 1131 07/16/23 1235 07/16/23 2135 07/17/23 0754 07/17/23 1126  GLUCAP 182* 170* 151* 128* 196*   Lipid Profile: No results for input(s): "CHOL", "HDL", "LDLCALC", "TRIG", "CHOLHDL", "LDLDIRECT" in the last 72 hours. Thyroid Function Tests: No results for input(s): "TSH", "T4TOTAL", "FREET4", "T3FREE", "THYROIDAB" in the last 72 hours.  Anemia Panel: No results for input(s): "VITAMINB12", "FOLATE", "FERRITIN", "TIBC", "IRON", "RETICCTPCT" in the last 72 hours. Sepsis Labs: Recent Labs  Lab 07/12/23 1118 07/12/23 1215 07/12/23 1557 07/12/23 2149  LATICACIDVEN >9.0* >9.0* 8.1* 3.4*    Recent Results (from the past 240 hour(s))  Resp panel by RT-PCR (RSV, Flu A&B, Covid) Urine, Clean Catch     Status: None   Collection Time: 2023/07/26  1:30 PM   Specimen: Urine, Clean Catch; Nasal Swab  Result Value Ref Range  Status   SARS Coronavirus 2 by RT PCR NEGATIVE NEGATIVE Final    Comment: (NOTE) SARS-CoV-2 target nucleic acids are NOT DETECTED.  The SARS-CoV-2 RNA is generally detectable in upper respiratory specimens during the acute phase of infection. The lowest concentration of SARS-CoV-2 viral copies this assay can detect is 138 copies/mL. A negative result does not preclude SARS-Cov-2 infection and should not be used as the sole basis for treatment or other patient management decisions. A negative result may occur with  improper specimen collection/handling, submission of specimen other than nasopharyngeal swab, presence of viral mutation(s) within the areas targeted by this assay, and inadequate number of viral copies(<138 copies/mL). A negative result must be combined with clinical observations, patient history, and epidemiological information. The expected result is Negative.  Fact Sheet for Patients:  BloggerCourse.com  Fact Sheet for Healthcare Providers:  SeriousBroker.it  This test is no t yet approved or cleared by the [place] FDA and  has been authorized for detection and/or diagnosis of SARS-CoV-2 by FDA under an Emergency Use Authorization (EUA). This EUA will remain  in effect (meaning this test can be used) for the duration of the COVID-19 declaration under Section 564(b)(1) of the Act, 21 U.S.C.section 360bbb-3(b)(1), unless the authorization is terminated  or revoked sooner.       Influenza A by PCR NEGATIVE NEGATIVE Final   Influenza B by PCR NEGATIVE NEGATIVE Final    Comment: (NOTE) The Xpert Xpress SARS-CoV-2/FLU/RSV plus assay is intended as an aid in the diagnosis of influenza from Nasopharyngeal swab specimens and should not be used as a sole basis for treatment. Nasal washings and aspirates are unacceptable for Xpert Xpress SARS-CoV-2/FLU/RSV testing.  Fact Sheet for  Patients: BloggerCourse.com  Fact Sheet for Healthcare Providers: SeriousBroker.it  This test is not yet approved or cleared by the [place] FDA and has been authorized for detection and/or diagnosis of SARS-CoV-2 by FDA under an Emergency Use Authorization (EUA). This EUA will remain in effect (meaning this test can be used) for the duration of the COVID-19 declaration under Section 564(b)(1) of the Act, 21 U.S.C. section 360bbb-3(b)(1), unless the authorization is terminated or revoked.     Resp Syncytial Virus by PCR NEGATIVE NEGATIVE Final    Comment: (NOTE) Fact Sheet for Patients: BloggerCourse.com  Fact Sheet for Healthcare Providers: SeriousBroker.it  This test is not yet approved or cleared by the [place] FDA and has been authorized for detection and/or diagnosis of SARS-CoV-2 by  FDA under an Emergency Use Authorization (EUA). This EUA will remain in effect (meaning this test can be used) for the duration of the COVID-19 declaration under Section 564(b)(1) of the Act, 21 U.S.C. section 360bbb-3(b)(1), unless the authorization is terminated or revoked.  Performed at [facility], [address], [place], [place] [address]   Urine Culture (for pregnant, neutropenic or urologic patients or patients with an indwelling urinary catheter)     Status: Abnormal   Collection Time: 07/31/2023  1:30 PM   Specimen: Urine, Clean Catch  Result Value Ref Range Status   Specimen Description   Final    URINE, CLEAN CATCH Performed at [facility], [address], [place], [place] [address]    Special Requests   Final    NONE Performed at [facility], [address] [place], [place] [address]    Culture >=100,000 COLONIES/mL KLEBSIELLA OXYTOCA (A)  Final   Report Status 07/13/2023 FINAL  Final   Organism ID,  Bacteria KLEBSIELLA OXYTOCA (A)  Final      Susceptibility   Klebsiella oxytoca - MIC*    AMPICILLIN >=32 RESISTANT Resistant     CEFEPIME <=0.12 SENSITIVE Sensitive     CEFTRIAXONE <=0.25 SENSITIVE Sensitive     CIPROFLOXACIN <=0.25 SENSITIVE Sensitive     GENTAMICIN <=1 SENSITIVE Sensitive     IMIPENEM <=0.25 SENSITIVE Sensitive     NITROFURANTOIN 32 SENSITIVE Sensitive     TRIMETH/SULFA <=20 SENSITIVE Sensitive     AMPICILLIN/SULBACTAM 4 SENSITIVE Sensitive     PIP/TAZO <=4 SENSITIVE Sensitive ug/mL    * >=100,000 COLONIES/mL KLEBSIELLA OXYTOCA  Culture, blood (x 2)     Status: None   Collection Time: 07/12/23 11:18 AM   Specimen: BLOOD RIGHT HAND  Result Value Ref Range Status   Specimen Description   Final    BLOOD RIGHT HAND Performed at [facility], [address] [place], [place] [address]    Special Requests   Final    BOTTLES DRAWN AEROBIC AND ANAEROBIC Blood Culture adequate volume Performed at [facility], [address], [place], [place] [address]    Culture   Final    NO GROWTH 5 DAYS Performed at [facility], [address], [place], [place] [address]    Report Status 07/17/2023 FINAL  Final  Culture, blood (x 2)     Status: None   Collection Time: 07/12/23 11:19 AM   Specimen: BLOOD RIGHT ARM  Result Value Ref Range Status   Specimen Description   Final    BLOOD RIGHT ARM Performed at [facility], [address] [place], [place] [address]    Special Requests   Final    BOTTLES DRAWN AEROBIC AND ANAEROBIC Blood Culture adequate volume Performed at [facility], [address] [place], [place] [address]    Culture   Final    NO GROWTH 5 DAYS Performed at [facility], [address], [place], [place] [address]    Report Status 07/17/2023 FINAL  Final  MRSA Next Gen by PCR, Nasal     Status: None   Collection Time: 07/12/23  3:46 PM   Specimen: Nasal Mucosa; Nasal Swab  Result Value  Ref Range Status   MRSA by PCR Next Gen NOT DETECTED NOT DETECTED Final    Comment: (NOTE) The GeneXpert MRSA Assay (FDA approved for NASAL specimens only), is one component of a comprehensive MRSA  colonization surveillance program. It is not intended to diagnose MRSA infection nor to guide or monitor treatment for MRSA infections. Test performance is not FDA approved in patients less than 39 years old. Performed at [facility], [address] [place], [place] [address]          Radiology Studies: No results found.      Scheduled Meds:  sodium chloride   Intravenous Once   CARBOplatin  470 mg Intravenous Once   Chlorhexidine Gluconate Cloth  6 each Topical Daily   docusate sodium  100 mg Oral BID   dorzolamide  1 drop Right Eye BID   enoxaparin (LOVENOX) injection  40 mg Subcutaneous Daily   etoposide  80 mg/m2 (Treatment Plan Recorded) Intravenous Once   feeding supplement (GLUCERNA SHAKE)  237 mL Oral BID BM   insulin aspart  0-15 Units Subcutaneous TID WC   insulin aspart  0-5 Units Subcutaneous QHS   insulin glargine-yfgn  12 Units Subcutaneous Daily   mouth rinse  15 mL Mouth Rinse 4 times per day   palonosetron  0.25 mg Intravenous Once   pantoprazole  40 mg Oral BID AC   phosphorus  250 mg Oral TID   sodium chloride flush  3 mL Intravenous Q12H   timolol  1 drop Right Eye BID   Continuous Infusions:  ampicillin-sulbactam (UNASYN) IV 3 g (07/17/23 1206)   dexamethasone (DECADRON) IVPB ([facility])     fosaprepitant (EMEND) 150 mg in sodium chloride 0.9 % 145 mL IVPB       LOS: 5 days    Time spent: 55 minutes    Barnetta Chapel, MD [facility]

## 2023-07-18 DIAGNOSIS — R579 Shock, unspecified: Secondary | ICD-10-CM | POA: Diagnosis not present

## 2023-07-18 LAB — RENAL FUNCTION PANEL
Albumin: 1.9 g/dL — ABNORMAL LOW (ref 3.5–5.0)
Anion gap: 7 (ref 5–15)
BUN: 9 mg/dL (ref 8–23)
CO2: 27 mmol/L (ref 22–32)
Calcium: 6.1 mg/dL — CL (ref 8.9–10.3)
Chloride: 110 mmol/L (ref 98–111)
Creatinine, Ser: 0.39 mg/dL — ABNORMAL LOW (ref 0.61–1.24)
GFR, Estimated: >60 mL/min (ref >60)
Glucose, Bld: 83 mg/dL (ref 70–99)
Phosphorus: 2.1 mg/dL — ABNORMAL LOW (ref 2.5–4.6)
Potassium: 3.7 mmol/L (ref 3.5–5.1)
Sodium: 144 mmol/L (ref 135–145)

## 2023-07-18 LAB — COMPREHENSIVE METABOLIC PANEL
ALT: 554 U/L — ABNORMAL HIGH (ref 0–44)
AST: 68 U/L — ABNORMAL HIGH (ref 15–41)
Albumin: 1.9 g/dL — ABNORMAL LOW (ref 3.5–5.0)
Alkaline Phosphatase: 134 U/L — ABNORMAL HIGH (ref 38–126)
Anion gap: 6 (ref 5–15)
BUN: 9 mg/dL (ref 8–23)
CO2: 27 mmol/L (ref 22–32)
Calcium: 6.1 mg/dL — CL (ref 8.9–10.3)
Chloride: 110 mmol/L (ref 98–111)
Creatinine, Ser: 0.38 mg/dL — ABNORMAL LOW (ref 0.61–1.24)
GFR, Estimated: >60 mL/min (ref >60)
Glucose, Bld: 83 mg/dL (ref 70–99)
Potassium: 3.7 mmol/L (ref 3.5–5.1)
Sodium: 143 mmol/L (ref 135–145)
Total Bilirubin: 0.7 mg/dL (ref 0.3–1.2)
Total Protein: 4.7 g/dL — ABNORMAL LOW (ref 6.5–8.1)

## 2023-07-18 LAB — TYPE AND SCREEN
ABO/RH(D): A POS
Antibody Screen: NEGATIVE
Unit division: 0
Unit division: 0

## 2023-07-18 LAB — GLUCOSE, CAPILLARY
Glucose-Capillary: 139 mg/dL — ABNORMAL HIGH (ref 70–99)
Glucose-Capillary: 183 mg/dL — ABNORMAL HIGH (ref 70–99)
Glucose-Capillary: 168 mg/dL — ABNORMAL HIGH (ref 70–99)
Glucose-Capillary: 30 mg/dL — CL (ref 70–99)
Glucose-Capillary: 36 mg/dL — CL (ref 70–99)
Glucose-Capillary: 54 mg/dL — ABNORMAL LOW (ref 70–99)
Glucose-Capillary: 86 mg/dL (ref 70–99)
Glucose-Capillary: 162 mg/dL — ABNORMAL HIGH (ref 70–99)

## 2023-07-18 LAB — CBC WITH DIFFERENTIAL/PLATELET
Abs Immature Granulocytes: 0.16 10*3/uL — ABNORMAL HIGH (ref 0.00–0.07)
Basophils Absolute: 0 10*3/uL (ref 0.0–0.1)
Basophils Relative: 0 %
Eosinophils Absolute: 0 10*3/uL (ref 0.0–0.5)
Eosinophils Relative: 0 %
HCT: 26.5 % — ABNORMAL LOW (ref 39.0–52.0)
Hemoglobin: 8.7 g/dL — ABNORMAL LOW (ref 13.0–17.0)
Immature Granulocytes: 2 %
Lymphocytes Relative: 6 %
Lymphs Abs: 0.4 10*3/uL — ABNORMAL LOW (ref 0.7–4.0)
MCH: 31 pg (ref 26.0–34.0)
MCHC: 32.8 g/dL (ref 30.0–36.0)
MCV: 94.3 fL (ref 80.0–100.0)
Monocytes Absolute: 0.5 10*3/uL (ref 0.1–1.0)
Monocytes Relative: 6 %
Neutro Abs: 6.6 10*3/uL (ref 1.7–7.7)
Neutrophils Relative %: 86 %
Platelets: 84 10*3/uL — ABNORMAL LOW (ref 150–400)
RBC: 2.81 MIL/uL — ABNORMAL LOW (ref 4.22–5.81)
RDW: 15 % (ref 11.5–15.5)
WBC: 7.7 10*3/uL (ref 4.0–10.5)
nRBC: 1.6 % — ABNORMAL HIGH (ref 0.0–0.2)

## 2023-07-18 LAB — MAGNESIUM: Magnesium: 1.9 mg/dL (ref 1.7–2.4)

## 2023-07-18 LAB — BPAM RBC
Blood Product Expiration Date: 202411012359
Blood Product Expiration Date: 202411062359
ISSUE DATE / TIME: 202410090734
ISSUE DATE / TIME: 202410111025
Unit Type and Rh: 6200
Unit Type and Rh: 6200

## 2023-07-18 MED ORDER — CALCIUM GLUCONATE-NACL 1-0.675 GM/50ML-% IV SOLN
1.0000 g | Freq: Once | INTRAVENOUS | Status: AC
  Administered 2023-07-18: 1000 mg via INTRAVENOUS
  Filled 2023-07-18: qty 50

## 2023-07-18 MED ORDER — CALCIUM CARBONATE ANTACID 500 MG PO CHEW
1.0000 | CHEWABLE_TABLET | Freq: Two times a day (BID) | ORAL | Status: DC
  Administered 2023-07-18 – 2023-07-21 (×8): 200 mg via ORAL
  Filled 2023-07-18 (×8): qty 1

## 2023-07-18 MED ORDER — POTASSIUM PHOSPHATES 15 MMOLE/5ML IV SOLN
30.0000 mmol | Freq: Once | INTRAVENOUS | Status: AC
  Administered 2023-07-18: 30 mmol via INTRAVENOUS
  Filled 2023-07-18: qty 10

## 2023-07-18 MED ORDER — POTASSIUM & SODIUM PHOSPHATES 280-160-250 MG PO PACK
1.0000 | PACK | Freq: Three times a day (TID) | ORAL | Status: DC
  Administered 2023-07-18 – 2023-07-24 (×22): 1 via ORAL
  Filled 2023-07-18 (×25): qty 1

## 2023-07-18 NOTE — Progress Notes (Signed)
PROGRESS NOTE    [facility]  ZOX:[id number] DOB: [birth year] DOA: 07/19/2023 PCP: Morene Crocker, MD    Brief Narrative:  The patient is a 72 year old African-American male with past medical history significant for prostate cancer, hypertension, hyperlipidemia and glaucoma.  Patient was admitted for generalized weakness.  Patient was found to have new metastatic neuroendocrine tumor to liver and adrenals.    10/6: Started on CTX, admitted for weakness, Xtandi held 10/7: Still weak but stable, Oncology consulted, planned for inpatient chemo 10/8: Patient acutely tachycardic, weak --> Lactate >9 --> transferred to ICU and antibiotics broadened, CCM consulted  10/9: Patient with hemoglobin less than 7.  Has history of bleeding gastric ulcer and 2 units of blood transfusion a month ago.  He was not able to continue Protonix with suspected allergy. Stabilizing, for EGD. EGD on 07/25/2023 that revealed normal esophagus, erosive gastropathy with no stigmata of recent bleeding, normal examined duodenum.   07/17/2023: was noted to have episodes of BRBPR consistent with radiation proctitis. Stable hemoglobin.    Assessment & Plan: Septic shock and lactic acidosis  -No sepsis physiology noted.   -Urine culture grew Klebsiella oxytoca.  Blood culture has not grown any organisms. -Patient is on IV Unasyn 3 g every 6 hours.  Patient was previously on vancomycin and meropenem.  Completed antibiotics. -CT angiogram of the chest, CT scan abdomen pelvis 10/8 that showed multifocal pneumonia, possibly, atypical organism; Innumerable liver masses consistent with metastatic disease, multiple adrenal masses and Pancreatic edema.  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   -Oncology following.   -Unlikely ready for chemotherapy with current level of weakness, serious illness requiring ICU admission.   -Likely discharge to SNF and come back for  chemotherapy.  Hypokalemia: -Potassium is adequate. -Will order IV potassium phosphate, as well as oral KCl.  Hypophosphatemia: -Phosphorus of 2.  Additional K-Phos today through IV.  Patient is always hypophosphatemic, will add potassium phosphate scheduled replacement.  Hypomagnesemia: -Magnesium of 1.9 today.  Adequate.  Hypocalcemia: Calcium 6.1, albumin is 2.1 Corrected calcium is 7.6 This is mostly because of chronic illness.  1 g calcium gluconate was given overnight.  Will start patient on calcium supplementation as long-term therapy.  Anemia: -Likely multifactorial. -Patient underwent EGD on 07/24/2023 (see above documentation). -Hemoglobin of 8.7.  -Hold subcutaneous Lovenox. -GI input is highly appreciated.   -Prior history of gastric ulcer documented.   Abnormal liver function test, transaminitis: Shock liver. -See above documentation.  Appropriately trending down.  Essential hypertension: -Continue to optimize. -Last documented blood pressure was 148/80 mmHg.    Prostate cancer:  -[name] is on hold for now.   Out of ICU to telemetry bed.  If no further bleeding, stable to transfer to SNF.  DVT prophylaxis: Discontinue Lovenox.  Code Status: Full code Family Communication: None today. Disposition Plan: Status is: Inpatient Remains inpatient appropriate because: Needs a SNF for rehab.   Consultants:  Oncology Critical care GI  Procedures:  EGD  Antimicrobials:  Vancomycin meropenem 10/8--- 10/10 Unasyn 10/10--- 10/13   Subjective: -Patient seen and examined.  Patient tells me that he is feeling more stronger today.  And expects to get out of the bed with therapies.  He had 2 small amount of bright red blood yesterday while passing stool.  Patient tells me this has happened before and due to his radiation.  Denies any nausea vomiting chest pain or abdominal pain.  He is very much anticipating to go to  rehab.  Objective: Vitals:   07/18/23 0400  07/18/23 0500 07/18/23 0600 07/18/23 0700  BP: 139/61 (!) 151/73 (!) 162/66 (!) 146/98  Pulse: 90 88 75 80  Resp: 19 20 18  (!) 26  Temp:      TempSrc:      SpO2: 96% 96% 94% 98%  Weight:      Height:        Intake/Output Summary (Last 24 hours) at 07/18/2023 0754 Last data filed at 07/18/2023 0700 Gross per 24 hour  Intake 1146.29 ml  Output 1031 ml  Net 115.29 ml   Filed Weights   07/28/2023 1145 07/12/23 1545 2023/08/12 1538  Weight: 68 kg 70.9 kg 68 kg    Examination: General: Patient looks fairly comfortable.  Alert awake and oriented.  Grossly weak but without any neurological deficits.  In normal mood today. Cardiovascular: S1-S2 normal.  Regular rate rhythm. Respiratory: Bilateral clear.  No added sounds. Gastrointestinal: Soft.  Nontender.  Bowel sound present. Ext: No edema or swelling.   Data Reviewed: I have personally reviewed following labs and imaging studies  CBC: Recent Labs  Lab 07/14/23 0308 08-12-23 0323 Aug 12, 2023 1812 07/16/23 0256 07/17/23 0308 07/17/23 1151 07/17/23 1924 07/18/23 0306  WBC 7.1 6.9  --  7.4 7.6  --   --  7.7  NEUTROABS 6.3 6.0  --  6.3 6.4  --   --  6.6  HGB 7.8* 7.1*   < > 8.9* 8.7* 9.6* 9.4* 8.7*  HCT 23.4* 21.6*   < > 26.5* 25.8* 28.7* 28.9* 26.5*  MCV 92.5 92.3  --  92.7 93.1  --   --  94.3  PLT 72* 69*  --  73* 77*  --   --  84*   < > = values in this interval not displayed.   Basic Metabolic Panel: Recent Labs  Lab 07/14/23 0308 08/12/2023 0323 07/16/23 0256 07/17/23 0308 07/17/23 0309 07/18/23 0306  NA 145 142 144 144 144 143  144  K 3.0* 3.3* 3.5 3.1* 3.0* 3.7  3.7  CL 111 108 108 108 108 110  110  CO2 25 26 29 27 28 27  27   GLUCOSE 134* 167* 150* 143* 145* 83  83  BUN 22 16 13 13 14 9  9   CREATININE 0.40* 0.35* 0.39* 0.45* 0.40* 0.38*  0.39*  CALCIUM 6.7* 6.7* 6.5* 6.5* 6.5* 6.1*  6.1*  MG 1.8 1.6* 1.9 1.7  --  1.9  PHOS 1.5* 1.4* 1.5* 1.2* 1.2* 2.1*   GFR: Estimated Creatinine Clearance: 82.6  mL/min (A) (by C-G formula based on SCr of 0.38 mg/dL (L)). Liver Function Tests: Recent Labs  Lab 07/14/23 0308 2023-08-12 0323 07/16/23 0256 07/17/23 0308 07/17/23 0309 07/18/23 0306  AST 1,065* 365* 166* 95*  --  68*  ALT 2,337* 1,564* 1,123* 776*  --  554*  ALKPHOS 81 104 120 139*  --  134*  BILITOT 0.9 0.5 0.7 0.7  --  0.7  PROT 4.3* 4.4* 4.6* 4.6*  --  4.7*  ALBUMIN 2.0* 1.9* 1.9* 1.9* 1.9* 1.9*  1.9*   No results for input(s): "LIPASE", "AMYLASE" in the last 168 hours. No results for input(s): "AMMONIA" in the last 168 hours.  Coagulation Profile: Recent Labs  Lab 07/12/23 1118 07/17/23 1151  INR 1.5* 1.1   Cardiac Enzymes: Recent Labs  Lab 07/12/23 0546  CKTOTAL 93   BNP (last 3 results) No results for input(s): "PROBNP" in the last 8760 hours. HbA1C: No results for input(s): "  HGBA1C" in the last 72 hours. CBG: Recent Labs  Lab 07/17/23 0754 07/17/23 1126 07/17/23 1653 07/17/23 2136 07/18/23 0729  GLUCAP 128* 196* 191* 88 139*   Lipid Profile: No results for input(s): "CHOL", "HDL", "LDLCALC", "TRIG", "CHOLHDL", "LDLDIRECT" in the last 72 hours. Thyroid Function Tests: No results for input(s): "TSH", "T4TOTAL", "FREET4", "T3FREE", "THYROIDAB" in the last 72 hours.  Anemia Panel: No results for input(s): "VITAMINB12", "FOLATE", "FERRITIN", "TIBC", "IRON", "RETICCTPCT" in the last 72 hours. Sepsis Labs: Recent Labs  Lab 07/12/23 1118 07/12/23 1215 07/12/23 1557 07/12/23 2149  LATICACIDVEN >9.0* >9.0* 8.1* 3.4*    Recent Results (from the past 240 hour(s))  Resp panel by RT-PCR (RSV, Flu A&B, Covid) Urine, Clean Catch     Status: None   Collection Time: 07/28/2023  1:30 PM   Specimen: Urine, Clean Catch; Nasal Swab  Result Value Ref Range Status   SARS Coronavirus 2 by RT PCR NEGATIVE NEGATIVE Final    Comment: (NOTE) SARS-CoV-2 target nucleic acids are NOT DETECTED.  The SARS-CoV-2 RNA is generally detectable in upper respiratory specimens  during the acute phase of infection. The lowest concentration of SARS-CoV-2 viral copies this assay can detect is 138 copies/mL. A negative result does not preclude SARS-Cov-2 infection and should not be used as the sole basis for treatment or other patient management decisions. A negative result may occur with  improper specimen collection/handling, submission of specimen other than nasopharyngeal swab, presence of viral mutation(s) within the areas targeted by this assay, and inadequate number of viral copies(<138 copies/mL). A negative result must be combined with clinical observations, patient history, and epidemiological information. The expected result is Negative.  Fact Sheet for Patients:  BloggerCourse.com  Fact Sheet for Healthcare Providers:  SeriousBroker.it  This test is no t yet approved or cleared by the [place] FDA and  has been authorized for detection and/or diagnosis of SARS-CoV-2 by FDA under an Emergency Use Authorization (EUA). This EUA will remain  in effect (meaning this test can be used) for the duration of the COVID-19 declaration under Section 564(b)(1) of the Act, 21 U.S.C.section 360bbb-3(b)(1), unless the authorization is terminated  or revoked sooner.       Influenza A by PCR NEGATIVE NEGATIVE Final   Influenza B by PCR NEGATIVE NEGATIVE Final    Comment: (NOTE) The Xpert Xpress SARS-CoV-2/FLU/RSV plus assay is intended as an aid in the diagnosis of influenza from Nasopharyngeal swab specimens and should not be used as a sole basis for treatment. Nasal washings and aspirates are unacceptable for Xpert Xpress SARS-CoV-2/FLU/RSV testing.  Fact Sheet for Patients: BloggerCourse.com  Fact Sheet for Healthcare Providers: SeriousBroker.it  This test is not yet approved or cleared by the [place] FDA and has been authorized for detection  and/or diagnosis of SARS-CoV-2 by FDA under an Emergency Use Authorization (EUA). This EUA will remain in effect (meaning this test can be used) for the duration of the COVID-19 declaration under Section 564(b)(1) of the Act, 21 U.S.C. section 360bbb-3(b)(1), unless the authorization is terminated or revoked.     Resp Syncytial Virus by PCR NEGATIVE NEGATIVE Final    Comment: (NOTE) Fact Sheet for Patients: BloggerCourse.com  Fact Sheet for Healthcare Providers: SeriousBroker.it  This test is not yet approved or cleared by the [place] FDA and has been authorized for detection and/or diagnosis of SARS-CoV-2 by FDA under an Emergency Use Authorization (EUA). This EUA will remain in effect (meaning this test can be used) for the duration of  the COVID-19 declaration under Section 564(b)(1) of the Act, 21 U.S.C. section 360bbb-3(b)(1), unless the authorization is terminated or revoked.  Performed at [facility], [address], [place], [place] [address]   Urine Culture (for pregnant, neutropenic or urologic patients or patients with an indwelling urinary catheter)     Status: Abnormal   Collection Time: 07/25/2023  1:30 PM   Specimen: Urine, Clean Catch  Result Value Ref Range Status   Specimen Description   Final    URINE, CLEAN CATCH Performed at [facility], [address], [place], [place] [address]    Special Requests   Final    NONE Performed at [facility], [address], [place], [place] [address]    Culture >=100,000 COLONIES/mL KLEBSIELLA OXYTOCA (A)  Final   Report Status 07/13/2023 FINAL  Final   Organism ID, Bacteria KLEBSIELLA OXYTOCA (A)  Final      Susceptibility   Klebsiella oxytoca - MIC*    AMPICILLIN >=32 RESISTANT Resistant     CEFEPIME <=0.12 SENSITIVE Sensitive     CEFTRIAXONE <=0.25 SENSITIVE Sensitive     CIPROFLOXACIN <=0.25 SENSITIVE  Sensitive     GENTAMICIN <=1 SENSITIVE Sensitive     IMIPENEM <=0.25 SENSITIVE Sensitive     NITROFURANTOIN 32 SENSITIVE Sensitive     TRIMETH/SULFA <=20 SENSITIVE Sensitive     AMPICILLIN/SULBACTAM 4 SENSITIVE Sensitive     PIP/TAZO <=4 SENSITIVE Sensitive ug/mL    * >=100,000 COLONIES/mL KLEBSIELLA OXYTOCA  Culture, blood (x 2)     Status: None   Collection Time: 07/12/23 11:18 AM   Specimen: BLOOD RIGHT HAND  Result Value Ref Range Status   Specimen Description   Final    BLOOD RIGHT HAND Performed at [facility], [address], [place], [place] [address]    Special Requests   Final    BOTTLES DRAWN AEROBIC AND ANAEROBIC Blood Culture adequate volume Performed at [facility], [address], [place], [place] [address]    Culture   Final    NO GROWTH 5 DAYS Performed at [facility], [address] [place], [place] [address]    Report Status 07/17/2023 FINAL  Final  Culture, blood (x 2)     Status: None   Collection Time: 07/12/23 11:19 AM   Specimen: BLOOD RIGHT ARM  Result Value Ref Range Status   Specimen Description   Final    BLOOD RIGHT ARM Performed at [facility], [address], [place], [place] [address]    Special Requests   Final    BOTTLES DRAWN AEROBIC AND ANAEROBIC Blood Culture adequate volume Performed at [facility], [address], [place], [place] [address]    Culture   Final    NO GROWTH 5 DAYS Performed at [facility], [address], [place], [place] [address]    Report Status 07/17/2023 FINAL  Final  MRSA Next Gen by PCR, Nasal     Status: None   Collection Time: 07/12/23  3:46 PM   Specimen: Nasal Mucosa; Nasal Swab  Result Value Ref Range Status   MRSA by PCR Next Gen NOT DETECTED NOT DETECTED Final    Comment: (NOTE) The GeneXpert MRSA Assay (FDA approved for NASAL specimens only), is one component of a comprehensive MRSA colonization surveillance program. It is not  intended to diagnose MRSA infection nor to guide or monitor treatment for MRSA infections. Test performance is  not FDA approved in patients less than 77 years old. Performed at [facility], [address], [place], [place] [address]          Radiology Studies: No results found.      Scheduled Meds:  sodium chloride   Intravenous Once   calcium carbonate  1 tablet Oral BID WC   CARBOplatin  470 mg Intravenous Once   Chlorhexidine Gluconate Cloth  6 each Topical Daily   docusate sodium  100 mg Oral BID   dorzolamide  1 drop Right Eye BID   etoposide  80 mg/m2 (Treatment Plan Recorded) Intravenous Once   feeding supplement (GLUCERNA SHAKE)  237 mL Oral BID BM   insulin aspart  0-15 Units Subcutaneous TID WC   insulin aspart  0-5 Units Subcutaneous QHS   insulin glargine-yfgn  12 Units Subcutaneous Daily   mouth rinse  15 mL Mouth Rinse 4 times per day   palonosetron  0.25 mg Intravenous Once   pantoprazole  40 mg Oral BID AC   potassium & sodium phosphates  1 packet Oral TID WC & HS   sodium chloride flush  3 mL Intravenous Q12H   timolol  1 drop Right Eye BID   Continuous Infusions:  dexamethasone (DECADRON) IVPB ([facility])     fosaprepitant (EMEND) 150 mg in sodium chloride 0.9 % 145 mL IVPB     potassium PHOSPHATE IVPB (in mmol) 85 mL/hr at 07/18/23 0700     LOS: 6 days    Time spent: 35 minutes    Dorcas Carrow, MD [facility]

## 2023-07-18 NOTE — TOC CM/SW Note (Signed)
CMS list of facilities and star ratings provided to pt to review for facility preference.    [facility] [address] [place], [place] [address] [phone] Overall rating ??  Below average  [facility] at the [facility] [address] [place], [place] [address] [phone] Overall rating ?? Below average  [facility] [address] [place], [place] [address] [phone] Overall rating?? Below average  [facility] [address] [place], [place] [address] [phone] Overall rating ? Much below average  [facility] [address] [place], [place] [address] [phone] Overall rating ??? Average  [facility] [address] [place], [place] [address] [phone] Overall rating ? Much below average [facility] [address] [place], [place] [address] [phone] Overall rating ? Much below average  [employer] [facility] [employer] [facility] [address] [place], [place] [address] [phone] Overall rating ??? Average  [facility] [address] [place], [place] [address] [phone] Overall rating ? Much below average  [facility] [address] [place], [place] [address] [phone] Overall rating ?? Much below average  [facility] [address] [place], [place] [address] [phone] Overall rating ???? Above average  [facility] [address] [place], [place] [address] [phone] Overall rating ????? Much above average [facility] [address] [place], [place] [address] [phone] Overall rating ???? Above average  [facility] [address] [place], [place] [address] [phone] Overall rating ????? Much above average  [facility] [address] [place], [place] [address] [phone] Overall rating ????  [facility] [address] [place], [place] [address] [phone] Overall rating ????? Much above average  [facility] [address] [place], [place] [address] [phone] Overall rating ????? Much above average  [facility] [address] [place], [place] [address] [phone] Overall rating ? Much below average  [facility] [address] [place], [place] [address] [phone] Overall rating ??? Average  [facility] [address] [place], [place] [address] [phone] Overall rating ????? Much above average  [facility] [address] [place], [place] [address] [phone] Overall rating ??? Average  [facility] [address] [place], [place] [address] [phone] Overall rating ?? Below average  [facility] [address] [place], [place] [address] [phone] Overall rating ??? Average  [facility] [address] [place], [place] [address] [phone] Overall rating ????? Much above average  [facility] and [facility] [address] [place], [place] [address] [phone] Overall rating ? Much below average  [facility]	[place] at the [facility], [place] [address] [phone] Overall rating ????? Much above average  [facility] [address] [place], [place] [address] [phone] Overall rating ? Much below average  [facility] [address] [place], [place] [address] [phone] Overall rating ? Much below average  [facility] [address] [place], [place] [address] [phone] Overall rating ????? Much above average  [facility] [address] [place], [place] [address] [phone] Overall rating ??? Average  [facility] [address] [place], [place] [address] [phone] Overall rating ?? Below average  [facility] [address] [place], [place] [address] [phone] Overall rating ? Much below average  [facility] [address] [place], [place] [address] [phone] Overall rating ??? Average  [facility] [address] [place], [place] [address] [phone] Overall rating ? Much below average  [facility] [address] [place], [place] [address] [phone] Overall rating ??? Average [facility] [address] [place], [place] [address] [phone] Overall rating ? Much below average  [facility] [address] [place], [place] [address] [phone] Overall rating ????? Much above average  [facility] [address] [place], [place] [address] [phone] Overall rating ? Much below average  [facility] [address] [place], [place] [address] [phone] Overall rating ???? Above average  [facility] [address] [place], [place] [address] [phone] Overall rating ????? Above average

## 2023-07-18 NOTE — TOC Progression Note (Addendum)
Transition of Care [facility]) - Progression Note    Patient Details  Name: CAYETANO MIKITA MRN: [id number] Date of Birth: [birth year]  [facility] CM/SW Contact  Otelia Santee, LCSW Phone Number: 07/18/2023, 2:24 PM  Clinical Narrative:    Met with pt to review bed offers. CSW provided CMS list to pt w/ MCR star ratings. Pt accepted bed offer for [facility] CSW will begin insurance authorization once updated PT note is in chart.  CSW spoke with pt's daughter to also review bed offers. Pt's daughter agreeable to pt's decision to transfer to [facility].   ADDENDUM: Pt's insurance Berkley Harvey has been requested.   Expected Discharge Plan: Skilled Nursing Facility Barriers to Discharge: Continued Medical Work up  Expected Discharge Plan and Services In-house Referral: NA Discharge Planning Services: NA Post Acute Care Choice: Skilled Nursing Facility Living arrangements for the past 2 months: Apartment                                       Social Determinants of Health (SDOH) Interventions SDOH Screenings   Food Insecurity: No Food Insecurity (06/30/2023)  Housing: Patient Declined (06/30/2023)  Transportation Needs: No Transportation Needs (06/30/2023)  Utilities: Not At Risk (06/30/2023)  Alcohol Screen: Low Risk  (10/20/2022)  Depression (PHQ2-9): Low Risk  (06/24/2023)  Financial Resource Strain: Low Risk  (06/25/2020)  Physical Activity: Sufficiently Active (06/25/2020)  Social Connections: Moderately Integrated (06/25/2020)  Stress: No Stress Concern Present (10/20/2022)  Tobacco Use: Medium Risk (07/11/2023)    Readmission Risk Interventions    07/13/2023   11:19 AM 07/13/2023   11:18 AM 07/04/2023    1:50 PM  Readmission Risk Prevention Plan  Transportation Screening  Complete Complete  PCP or Specialist Appt within 3-5 Days   Complete  HRI or Home Care Consult   Complete  Social Work Consult for Recovery Care Planning/Counseling   Complete  Palliative  Care Screening   Complete  Medication Review Oceanographer)  Complete Complete  PCP or Specialist appointment within 3-5 days of discharge  Complete   HRI or Home Care Consult  Complete   SW Recovery Care/Counseling Consult  Complete   Palliative Care Screening Not Applicable Complete   Skilled Nursing Facility  Complete

## 2023-07-18 NOTE — Plan of Care (Signed)
  Problem: Education: Goal: Ability to describe self-care measures that may prevent or decrease complications (Diabetes Survival Skills Education) will improve Outcome: Progressing Goal: Individualized Educational Video(s) Outcome: Progressing   Problem: Coping: Goal: Ability to adjust to condition or change in health will improve Outcome: Progressing   Problem: Fluid Volume: Goal: Ability to maintain a balanced intake and output will improve Outcome: Progressing   Problem: Health Behavior/Discharge Planning: Goal: Ability to identify and utilize available resources and services will improve Outcome: Progressing Goal: Ability to manage health-related needs will improve Outcome: Progressing   Problem: Metabolic: Goal: Ability to maintain appropriate glucose levels will improve Outcome: Progressing   Problem: Nutritional: Goal: Maintenance of adequate nutrition will improve Outcome: Progressing Goal: Progress toward achieving an optimal weight will improve Outcome: Progressing   Problem: Skin Integrity: Goal: Risk for impaired skin integrity will decrease Outcome: Progressing   Problem: Tissue Perfusion: Goal: Adequacy of tissue perfusion will improve Outcome: Progressing   Problem: Education: Goal: Knowledge of General Education information will improve Description: Including pain rating scale, medication(s)/side effects and non-pharmacologic comfort measures Outcome: Progressing   Problem: Health Behavior/Discharge Planning: Goal: Ability to manage health-related needs will improve Outcome: Progressing   Problem: Clinical Measurements: Goal: Ability to maintain clinical measurements within normal limits will improve Outcome: Progressing Goal: Will remain free from infection Outcome: Progressing Goal: Diagnostic test results will improve Outcome: Progressing Goal: Respiratory complications will improve Outcome: Progressing Goal: Cardiovascular complication will  be avoided Outcome: Progressing   Problem: Activity: Goal: Risk for activity intolerance will decrease Outcome: Progressing   Problem: Nutrition: Goal: Adequate nutrition will be maintained Outcome: Progressing   Problem: Coping: Goal: Level of anxiety will decrease Outcome: Progressing   Problem: Elimination: Goal: Will not experience complications related to bowel motility Outcome: Progressing Goal: Will not experience complications related to urinary retention Outcome: Progressing   Problem: Pain Managment: Goal: General experience of comfort will improve Outcome: Progressing   Problem: Safety: Goal: Ability to remain free from injury will improve Outcome: Progressing   Problem: Skin Integrity: Goal: Risk for impaired skin integrity will decrease Outcome: Progressing   Problem: Fluid Volume: Goal: Hemodynamic stability will improve Outcome: Progressing   Problem: Clinical Measurements: Goal: Diagnostic test results will improve Outcome: Progressing Goal: Signs and symptoms of infection will decrease Outcome: Progressing   Problem: Respiratory: Goal: Ability to maintain adequate ventilation will improve Outcome: Progressing   Problem: Education: Goal: Knowledge of the prescribed therapeutic regimen will improve Outcome: Progressing   Problem: Activity: Goal: Ability to implement measures to reduce episodes of fatigue will improve Outcome: Progressing   Problem: Bowel/Gastric: Goal: Will not experience complications related to bowel motility Outcome: Progressing   Problem: Coping: Goal: Ability to identify and develop effective coping behavior will improve Outcome: Progressing   Problem: Nutritional: Goal: Maintenance of adequate nutrition will improve Outcome: Progressing

## 2023-07-18 NOTE — Progress Notes (Signed)
Physical Therapy Treatment Patient Details Name: William Jones MRN: [id number] DOB: 1951-07-30 Today's Date: 07/18/2023   History of Present Illness Mr. 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   Was discharged home with plans for outpatient chemo start, but became profoundly weak, returned to the hospital 07/12/23.: 10/8 Patient acutely tachycardic, weak --> Lactate >9 --> transferred to ICU    PT Comments  Patient received seated on BSc, mod assist to power up to stand, Pivot to bed with Rw. Patient performed UE exercises x 20 then noted SOB, HR 125. Patient assisted to supine  to rest.    If plan is discharge home, recommend the following: Two people to help with walking and/or transfers;A lot of help with bathing/dressing/bathroom;Assistance with cooking/housework;Assist for transportation;Help with stairs or ramp for entrance   Can travel by private vehicle     No  Equipment Recommendations  None recommended by PT    Recommendations for Other Services       Precautions / Restrictions Precautions Precautions: Fall Precaution Comments: monitor VS, HR high Restrictions Weight Bearing Restrictions: No     Mobility  Bed Mobility   Bed Mobility: Sit to Supine      Sit to supine: Min assist   General bed mobility comments: assist legs onto bed    Transfers Overall transfer level: Needs assistance Equipment used: Rolling walker (2 wheels) Transfers: Sit to/from Stand, Bed to chair/wheelchair/BSC Sit to Stand: Mod assist           General transfer comment: assist to power up from [facility], Step to bed using RW.    Ambulation/Gait                   Stairs             Wheelchair Mobility     Tilt Bed    Modified Rankin (Stroke Patients Only)       Balance Overall balance assessment: Needs  assistance Sitting-balance support: Feet supported Sitting balance-Leahy Scale: Fair     Standing balance support: During functional activity, Bilateral upper extremity supported, Reliant on assistive device for balance Standing balance-Leahy Scale: Poor                              Cognition Arousal: Alert Behavior During Therapy: WFL for tasks assessed/performed Overall Cognitive Status: Within Functional Limits for tasks assessed                                 General Comments: some stuttering wiht speech. overall appropriate. appeared to have some decreased insight to deficits,        Exercises Other Exercises Other Exercises: Blue TB for shoulder abduction,  x 20    General Comments        Pertinent Vitals/Pain Pain Assessment Pain Assessment: No/denies pain    Home Living Family/patient expects to be discharged to:: Private residence Living Arrangements: Spouse/significant other Available Help at Discharge: Family;Available PRN/intermittently                    Prior Function            PT Goals (current goals can now be found in the care plan section) Progress towards PT goals: Progressing toward goals  Frequency    Min 1X/week      PT Plan      Co-evaluation              AM-PAC PT "6 Clicks" Mobility   Outcome Measure  Help needed turning from your back to your side while in a flat bed without using bedrails?: A Little Help needed moving from lying on your back to sitting on the side of a flat bed without using bedrails?: A Little Help needed moving to and from a bed to a chair (including a wheelchair)?: A Lot Help needed standing up from a chair using your arms (e.g., wheelchair or bedside chair)?: A Lot Help needed to walk in hospital room?: Total Help needed climbing 3-5 steps with a railing? : Total 6 Click Score: 10    End of Session Equipment Utilized During Treatment: Gait belt Activity  Tolerance: Patient limited by fatigue Patient left: in bed;with call bell/phone within reach;with bed alarm set Nurse Communication: Mobility status PT Visit Diagnosis: Unsteadiness on feet (R26.81);Other abnormalities of gait and mobility (R26.89);Muscle weakness (generalized) (M62.81);Difficulty in walking, not elsewhere classified (R26.2)     Time: 4098-1191 PT Time Calculation (min) (ACUTE ONLY): 21 min  Charges:    $Therapeutic Activity: 8-22 mins PT General Charges $$ ACUTE PT VISIT: 1 Visit                    Blanchard Kelch PT Acute Rehabilitation Services Office [phone] Weekend [phone]    [name] 07/18/2023, 4:25 PM

## 2023-07-18 NOTE — Progress Notes (Signed)
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   Was discharged home with plans for outpatient chemo start, but became profoundly weak, returned to the hospital 07/12/23.: 10/8 Patient acutely tachycardic, weak --> Lactate >9 --> transferred to ICU    PT Comments  The patient continues to be very weak, especially sit to stand, does better with raised surface. Able to pivot to [facility] then  recliner brought up. Anticipate patient will be able to ambulate next visit with recliner close behind. Patient will benefit from continued inpatient follow up therapy, <3 hours/day    If plan is discharge home, recommend the following: Two people to help with walking and/or transfers;A lot of help with bathing/dressing/bathroom;Assistance with cooking/housework;Assist for transportation;Help with stairs or ramp for entrance   Can travel by private vehicle     No  Equipment Recommendations       Recommendations for Other Services       Precautions / Restrictions Precautions Precautions: Fall Precaution Comments: monitor VS, HR high Restrictions Weight Bearing Restrictions: No     Mobility  Bed Mobility   Bed Mobility: Supine to Sit     Supine to sit: Mod assist, HOB elevated, Used rails     General bed mobility comments: assist to raise trunk and pivot hips to EOB    Transfers Overall transfer level: Needs assistance Equipment used: Rolling walker (2 wheels) Transfers: Sit to/from Stand, Bed to chair/wheelchair/BSC Sit to Stand: Mod assist, +2 safety/equipment, +2 physical assistance           General transfer comment: mod support to stand from bed x 2, legs very weak. bed raised to stand  at  RW, Step to to bedside commode, then stood from  [facility], pulled to stand pulling on the bed rail. Recliner brought up  after standing from toilet.    Ambulation/Gait                   Stairs             Wheelchair Mobility     Tilt Bed    Modified Rankin (Stroke Patients Only)       Balance Overall balance assessment: Needs assistance Sitting-balance support: Feet supported Sitting balance-Leahy Scale: Fair                                      Cognition Arousal: Alert Behavior During Therapy: WFL for tasks assessed/performed Overall Cognitive Status: Within Functional Limits for tasks assessed                                 General Comments: some stuttering wiht speech. overall appropriate. appeared to have some decreased insight to deficits,        Exercises      General Comments        Pertinent Vitals/Pain Pain Assessment Pain Assessment: No/denies pain    Home Living Family/patient expects to be discharged to:: Private residence Living Arrangements: Spouse/significant other Available Help at Discharge: Family;Available PRN/intermittently  Prior Function            PT Goals (current goals can now be found in the care plan section) Progress towards PT goals: Progressing toward goals    Frequency    Min 1X/week      PT Plan      Co-evaluation              AM-PAC PT "6 Clicks" Mobility   Outcome Measure  Help needed turning from your back to your side while in a flat bed without using bedrails?: A Lot Help needed moving from lying on your back to sitting on the side of a flat bed without using bedrails?: A Lot Help needed moving to and from a bed to a chair (including a wheelchair)?: A Lot Help needed standing up from a chair using your arms (e.g., wheelchair or bedside chair)?: A Lot Help needed to walk in hospital room?: Total Help needed climbing 3-5 steps with a  railing? : Total 6 Click Score: 10    End of Session Equipment Utilized During Treatment: Gait belt Activity Tolerance: Patient tolerated treatment well Patient left: in chair;with call bell/phone within reach;with chair alarm set Nurse Communication: Mobility status PT Visit Diagnosis: Unsteadiness on feet (R26.81);Other abnormalities of gait and mobility (R26.89);Muscle weakness (generalized) (M62.81);Difficulty in walking, not elsewhere classified (R26.2)     Time: 1884-1660 PT Time Calculation (min) (ACUTE ONLY): 23 min  Charges:  2 therapeutic activity                          Blanchard Kelch PT Acute Rehabilitation Services Office [phone] Weekend pager-[phone]    [name] 07/18/2023, 2:23 PM

## 2023-07-19 ENCOUNTER — Encounter (HOSPITAL_COMMUNITY): Payer: Self-pay | Admitting: Gastroenterology

## 2023-07-19 DIAGNOSIS — R579 Shock, unspecified: Secondary | ICD-10-CM | POA: Diagnosis not present

## 2023-07-19 LAB — BASIC METABOLIC PANEL
Anion gap: 7 (ref 5–15)
BUN: 12 mg/dL (ref 8–23)
CO2: 25 mmol/L (ref 22–32)
Calcium: 6.4 mg/dL — CL (ref 8.9–10.3)
Chloride: 106 mmol/L (ref 98–111)
Creatinine, Ser: 0.35 mg/dL — ABNORMAL LOW (ref 0.61–1.24)
GFR, Estimated: >60 mL/min (ref >60)
Glucose, Bld: 169 mg/dL — ABNORMAL HIGH (ref 70–99)
Potassium: 2.7 mmol/L — CL (ref 3.5–5.1)
Sodium: 138 mmol/L (ref 135–145)

## 2023-07-19 LAB — CBC WITH DIFFERENTIAL/PLATELET
Abs Immature Granulocytes: 0.17 10*3/uL — ABNORMAL HIGH (ref 0.00–0.07)
Basophils Absolute: 0 10*3/uL (ref 0.0–0.1)
Basophils Relative: 0 %
Eosinophils Absolute: 0 10*3/uL (ref 0.0–0.5)
Eosinophils Relative: 0 %
HCT: 27.6 % — ABNORMAL LOW (ref 39.0–52.0)
Hemoglobin: 9.5 g/dL — ABNORMAL LOW (ref 13.0–17.0)
Immature Granulocytes: 2 %
Lymphocytes Relative: 5 %
Lymphs Abs: 0.5 10*3/uL — ABNORMAL LOW (ref 0.7–4.0)
MCH: 31.5 pg (ref 26.0–34.0)
MCHC: 34.4 g/dL (ref 30.0–36.0)
MCV: 91.4 fL (ref 80.0–100.0)
Monocytes Absolute: 0.4 10*3/uL (ref 0.1–1.0)
Monocytes Relative: 4 %
Neutro Abs: 8.7 10*3/uL — ABNORMAL HIGH (ref 1.7–7.7)
Neutrophils Relative %: 89 %
Platelets: 102 10*3/uL — ABNORMAL LOW (ref 150–400)
RBC: 3.02 MIL/uL — ABNORMAL LOW (ref 4.22–5.81)
RDW: 15.1 % (ref 11.5–15.5)
WBC: 9.8 10*3/uL (ref 4.0–10.5)
nRBC: 1.1 % — ABNORMAL HIGH (ref 0.0–0.2)

## 2023-07-19 LAB — GLUCOSE, CAPILLARY
Glucose-Capillary: 108 mg/dL — ABNORMAL HIGH (ref 70–99)
Glucose-Capillary: 261 mg/dL — ABNORMAL HIGH (ref 70–99)
Glucose-Capillary: 318 mg/dL — ABNORMAL HIGH (ref 70–99)
Glucose-Capillary: 132 mg/dL — ABNORMAL HIGH (ref 70–99)

## 2023-07-19 MED ORDER — POTASSIUM CHLORIDE CRYS ER 20 MEQ PO TBCR
40.0000 meq | EXTENDED_RELEASE_TABLET | Freq: Three times a day (TID) | ORAL | Status: AC
  Administered 2023-07-19 – 2023-07-20 (×4): 40 meq via ORAL
  Filled 2023-07-19 (×4): qty 2

## 2023-07-19 MED ORDER — METOPROLOL TARTRATE 25 MG PO TABS
50.0000 mg | ORAL_TABLET | Freq: Two times a day (BID) | ORAL | Status: DC
  Administered 2023-07-19 – 2023-07-25 (×13): 50 mg via ORAL
  Filled 2023-07-19: qty 2
  Filled 2023-07-19 (×7): qty 1
  Filled 2023-07-19 (×2): qty 2
  Filled 2023-07-19 (×3): qty 1

## 2023-07-19 MED ORDER — BOOST / RESOURCE BREEZE PO LIQD CUSTOM
1.0000 | Freq: Three times a day (TID) | ORAL | Status: DC
  Administered 2023-07-19 – 2023-07-28 (×6): 1 via ORAL
  Administered 2023-07-28: 237 mL via ORAL
  Administered 2023-07-29 – 2023-08-03 (×10): 1 via ORAL

## 2023-07-19 MED ORDER — SACCHAROMYCES BOULARDII 250 MG PO CAPS
250.0000 mg | ORAL_CAPSULE | Freq: Two times a day (BID) | ORAL | Status: AC
  Administered 2023-07-19 – 2023-07-20 (×2): 250 mg via ORAL
  Filled 2023-07-19 (×2): qty 1

## 2023-07-19 MED ORDER — LOPERAMIDE HCL 2 MG PO CAPS
2.0000 mg | ORAL_CAPSULE | Freq: Once | ORAL | Status: AC
  Administered 2023-07-19: 2 mg via ORAL
  Filled 2023-07-19: qty 1

## 2023-07-19 NOTE — Progress Notes (Signed)
   07/19/23 1124  Assess: MEWS Score  BP 111/68  Pulse Rate (!) 130  Level of Consciousness Alert  SpO2 99 %  O2 Device Room Air  Patient Activity (if Appropriate) In bed  Assess: if the MEWS score is Yellow or Red  Were vital signs accurate and taken at a resting state? Yes  Does the patient meet 2 or more of the SIRS criteria? No  Does the patient have a confirmed or suspected source of infection? No  MEWS guidelines implemented  Yes, yellow  Treat  MEWS Interventions Considered administering scheduled or prn medications/treatments as ordered  Take Vital Signs  Increase Vital Sign Frequency  Yellow: Q2hr x1, continue Q4hrs until patient remains green for 12hrs  Escalate  MEWS: Escalate Yellow: Discuss with charge nurse and consider notifying provider and/or RRT  Notify: Charge Nurse/RN  Name of Charge Nurse/RN Notified Hart Robinsons RN  Provider Notification  Provider Name/Title Knute Neu, MD  Date Provider Notified 07/19/23  Time Provider Notified 1125  Method of Notification Page  Notification Reason Change in status  Provider response See new orders  Date of Provider Response 07/19/23  Time of Provider Response 1125  Assess: SIRS CRITERIA  SIRS Temperature  0  SIRS Pulse 1  SIRS Respirations  0  SIRS WBC 0  SIRS Score Sum  1

## 2023-07-19 NOTE — Progress Notes (Signed)
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   Was discharged home with plans for outpatient chemo start, but became profoundly weak, returned to the hospital 07/12/23.: 10/8 Patient acutely tachycardic, weak --> Lactate >9 --> transferred to ICU   OT comments  Patient was motivated to participate in session but limited with increased HR.  Patient was noted to have increased HR during session with highest being 160bpm with standing EOB. Patient returned to bed at this time. Nurse present in room during session. Patients BP Was 141/95 mmhg sitting EOB. Patient would continue to benefit from skilled OT services at this time while admitted and after d/c to address noted deficits in order to improve overall safety and independence in ADLs.        If plan is discharge home, recommend the following:  Direct supervision/assist for medications management;Assist for transportation;Assistance with cooking/housework;Help with stairs or ramp for entrance;Direct supervision/assist for financial management   Equipment Recommendations  None recommended by OT       Precautions / Restrictions Precautions Precautions: Fall Precaution Comments: monitor VS, HR high Restrictions Weight Bearing Restrictions: No       Mobility Bed Mobility Overal bed mobility: Needs Assistance Bed Mobility: Supine to Sit, Sit to Supine     Supine to sit: Mod assist Sit to supine: Max assist   General bed mobility comments: physical A to get back trunk and BLE back into bed.         Balance Overall balance assessment: Needs assistance Sitting-balance support: Feet supported Sitting balance-Leahy Scale: Fair      Standing balance support: During functional activity, Bilateral upper extremity supported, Reliant on assistive device for balance Standing balance-Leahy Scale: Poor     ADL either performed or assessed with clinical judgement   ADL Overall ADL's : Needs assistance/impaired     Grooming: Wash/dry face;Oral care;Sitting;Supervision/safety Grooming Details (indicate cue type and reason): sitting EOB with increaed HR to 144 bpm with nurse present in room. nurse provided hydrolozine after this task.        Extremity/Trunk Assessment         Cervical / Trunk Assessment Cervical / Trunk Assessment: Normal     Cognition Arousal: Alert Behavior During Therapy: WFL for tasks assessed/performed Overall Cognitive Status: Within Functional Limits for tasks assessed             General Comments: some stuttering wiht speech. overall appropriate. appeared to have some decreased insight to deficits,                   Pertinent Vitals/ Pain       Pain Assessment Pain Assessment: No/denies pain         Frequency  Min 1X/week        Progress Toward Goals  OT Goals(current goals can now be found in the care plan section)        Plan      Co-evaluation      Reason for Co-Treatment: Complexity of the patient's impairments (multi-system involvement);Necessary to address cognition/behavior during functional activity PT goals addressed during session: Mobility/safety with mobility;Balance;Proper use of DME OT goals addressed during session: ADL's and self-care;Proper use of Adaptive equipment and DME      AM-PAC OT "  6 Clicks" Daily Activity     Outcome Measure   Help from another person eating meals?: None Help from another person taking care of personal grooming?: A Little Help from another person toileting, which includes using toliet, bedpan, or urinal?: A Little Help from another person bathing (including washing, rinsing, drying)?: A Little Help from  another person to put on and taking off regular upper body clothing?: A Little Help from another person to put on and taking off regular lower body clothing?: A Little 6 Click Score: 19    End of Session Equipment Utilized During Treatment: Gait belt;Rolling walker (2 wheels)  OT Visit Diagnosis: Other abnormalities of gait and mobility (R26.89)   Activity Tolerance Patient tolerated treatment well   Patient Left with call bell/phone within reach;in bed;with bed alarm set   Nurse Communication Mobility status        Time: 5784-6962 OT Time Calculation (min): 20 min  Charges: OT General Charges $OT Visit: 1 Visit OT Treatments $Self Care/Home Management : 8-22 mins  Rosalio Loud, MS Acute Rehabilitation Department Office# [phone]   Selinda Flavin 07/19/2023, 10:24 AM

## 2023-07-19 NOTE — Progress Notes (Signed)
PROGRESS NOTE    William Jones [facility]  WUJ:[id number] DOB: 01-14-1951 DOA: 07/11/2023 PCP: Morene Crocker, MD    Brief Narrative:  The patient is a 72 year old African-American male with past medical history significant for prostate cancer, hypertension, hyperlipidemia and glaucoma.  Patient was admitted for generalized weakness.  Patient was found to have new metastatic neuroendocrine tumor to liver and adrenals.    10/6: Started on ceftriaxone, admitted for weakness, Xtandi held 10/7: Still weak but stable, Oncology consulted, planned for inpatient chemo 10/8: Patient acutely tachycardic, weak --> Lactate >9 --> transferred to ICU and antibiotics broadened, CCM consulted  10/9: Patient with hemoglobin less than 7.  Has history of bleeding gastric ulcer and 2 units of blood transfusion a month ago.  He was not able to continue Protonix with suspected allergy. Stabilizing, for EGD. EGD on 07/27/2023 that revealed normal esophagus, erosive gastropathy with no stigmata of recent bleeding, normal examined duodenum.  07/17/2023: was noted to have episodes of BRBPR consistent with radiation proctitis. Stable hemoglobin.  07/19/2023: Clinically improving.  Potassium 2.7.  Orthostatic tachycardia.  Hemoglobin is stable.  Likely discharge to a SNF tomorrow.   Assessment & Plan: Septic shock and lactic acidosis  -Urine culture grew Klebsiella oxytoca.  Blood culture has been negative.   -Patient is on IV Unasyn 3 g every 6 hours.  Patient was previously on vancomycin and meropenem.  Completed antibiotics. -CT angiogram of the chest, CT scan abdomen pelvis 10/8 that showed multifocal pneumonia, possibly, atypical organism; Innumerable liver masses consistent with metastatic disease, multiple adrenal masses and Pancreatic edema.  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   -Oncology following.   -Unlikely ready for chemotherapy with current level  of weakness, serious illness requiring ICU admission.   -Likely discharge to SNF and come back for chemotherapy.  Hypokalemia: -Potassium is critically low.  Replaced today.  Recheck levels tomorrow.  Also check magnesium with morning labs. He will need scheduled replacement.  Hypophosphatemia: -Phosphorus is replaced.  Recheck tomorrow morning.  Hypomagnesemia: -Magnesium of 1.9 today.  Adequate.  Recheck tomorrow morning.  Hypocalcemia: Calcium 6.4, albumin is 2.1 Corrected calcium is 8 This is mostly because of chronic illness.  Will start patient on calcium supplementation as long-term therapy.  Anemia: -Likely multifactorial. -Patient underwent EGD on 07/06/2023 (see above documentation). -Hemoglobin of 9.5. -Hold subcutaneous Lovenox. -GI input is highly appreciated.   -Prior history of gastric ulcer documented.   Abnormal liver function test, transaminitis: Shock liver. -See above documentation.  Appropriately trending down.  Essential hypertension: -Continue to optimize. -Blood pressure is elevated now.  Will start patient on metoprolol 50 mg twice daily.  Prostate cancer:  -[name] is on hold for now.   Patient with much clinical improvement.  He was able to secure a bed at SNF, however his potassium came back as 2.7 and he was tachycardic on mobility.  Will monitor today replace electrolytes aggressively.  Anticipate discharge tomorrow.   DVT prophylaxis: Discontinue Lovenox.  Code Status: Full code Family Communication: None today. Disposition Plan: Status is: Inpatient Remains inpatient appropriate because: Needs a SNF for rehab.  Severe electrolyte abnormalities.   Consultants:  Oncology Critical care GI  Procedures:  EGD  Antimicrobials:  Vancomycin meropenem 10/8--- 10/10 Unasyn 10/10--- 10/13   Subjective:  Patient seen in the morning rounds.  She was very happy to be able to have more strength on the upper extremities.  Legs are still very  weak.  Denies any  other complaints.  Looks forward to go to rehab to get stronger.  Objective: Vitals:   07/18/23 1439 07/18/23 2113 07/19/23 0558 07/19/23 0944  BP: 132/83 (!) 153/94 (!) 154/92 (!) 141/95  Pulse: 96 83 85   Resp: 17 16 16    Temp: 98.1 F (36.7 C) (!) 97.5 F (36.4 C) (!) 97.5 F (36.4 C)   TempSrc: Oral Oral Oral   SpO2: 100% 98% 98%   Weight:      Height:        Intake/Output Summary (Last 24 hours) at 07/19/2023 1109 Last data filed at 07/19/2023 0030 Gross per 24 hour  Intake --  Output 100 ml  Net -100 ml   Filed Weights   2023/07/31 1145 07/12/23 1545 07/20/2023 1538  Weight: 68 kg 70.9 kg 68 kg    Examination: General: Patient looks fairly comfortable.  Alert awake and oriented.  Grossly weak but without any neurological deficits.  In normal mood today. His lower extremities are grossly weak and weaker than upper extremities. Cardiovascular: S1-S2 normal.  Regular rate rhythm. Respiratory: Bilateral clear.  No added sounds. Gastrointestinal: Soft.  Nontender.  Bowel sound present. Ext: No edema or swelling.   Data Reviewed: I have personally reviewed following labs and imaging studies  CBC: Recent Labs  Lab 07/13/2023 0323 07/08/2023 1812 07/16/23 0256 07/17/23 0308 07/17/23 1151 07/17/23 1924 07/18/23 0306 07/19/23 0540  WBC 6.9  --  7.4 7.6  --   --  7.7 9.8  NEUTROABS 6.0  --  6.3 6.4  --   --  6.6 8.7*  HGB 7.1*   < > 8.9* 8.7* 9.6* 9.4* 8.7* 9.5*  HCT 21.6*   < > 26.5* 25.8* 28.7* 28.9* 26.5* 27.6*  MCV 92.3  --  92.7 93.1  --   --  94.3 91.4  PLT 69*  --  73* 77*  --   --  84* 102*   < > = values in this interval not displayed.   Basic Metabolic Panel: Recent Labs  Lab 07/14/23 0308 07/16/2023 0323 07/16/23 0256 07/17/23 0308 07/17/23 0309 07/18/23 0306 07/19/23 0858  NA 145 142 144 144 144 143  144 138  K 3.0* 3.3* 3.5 3.1* 3.0* 3.7  3.7 2.7*  CL 111 108 108 108 108 110  110 106  CO2 25 26 29 27 28 27  27 25   GLUCOSE  134* 167* 150* 143* 145* 83  83 169*  BUN 22 16 13 13 14 9  9 12   CREATININE 0.40* 0.35* 0.39* 0.45* 0.40* 0.38*  0.39* 0.35*  CALCIUM 6.7* 6.7* 6.5* 6.5* 6.5* 6.1*  6.1* 6.4*  MG 1.8 1.6* 1.9 1.7  --  1.9  --   PHOS 1.5* 1.4* 1.5* 1.2* 1.2* 2.1*  --    GFR: Estimated Creatinine Clearance: 82.6 mL/min (A) (by C-G formula based on SCr of 0.35 mg/dL (L)). Liver Function Tests: Recent Labs  Lab 07/14/23 0308 07/18/2023 0323 07/16/23 0256 07/17/23 0308 07/17/23 0309 07/18/23 0306  AST 1,065* 365* 166* 95*  --  68*  ALT 2,337* 1,564* 1,123* 776*  --  554*  ALKPHOS 81 104 120 139*  --  134*  BILITOT 0.9 0.5 0.7 0.7  --  0.7  PROT 4.3* 4.4* 4.6* 4.6*  --  4.7*  ALBUMIN 2.0* 1.9* 1.9* 1.9* 1.9* 1.9*  1.9*   No results for input(s): "LIPASE", "AMYLASE" in the last 168 hours. No results for input(s): "AMMONIA" in the last 168 hours.  Coagulation Profile:  Recent Labs  Lab 07/12/23 1118 07/17/23 1151  INR 1.5* 1.1   Cardiac Enzymes: No results for input(s): "CKTOTAL", "CKMB", "CKMBINDEX", "TROPONINI" in the last 168 hours.  BNP (last 3 results) No results for input(s): "PROBNP" in the last 8760 hours. HbA1C: No results for input(s): "HGBA1C" in the last 72 hours. CBG: Recent Labs  Lab 07/18/23 2126 07/18/23 2145 07/18/23 2209 07/18/23 2307 07/19/23 0738  GLUCAP 36* 54* 86 162* 108*   Lipid Profile: No results for input(s): "CHOL", "HDL", "LDLCALC", "TRIG", "CHOLHDL", "LDLDIRECT" in the last 72 hours. Thyroid Function Tests: No results for input(s): "TSH", "T4TOTAL", "FREET4", "T3FREE", "THYROIDAB" in the last 72 hours.  Anemia Panel: No results for input(s): "VITAMINB12", "FOLATE", "FERRITIN", "TIBC", "IRON", "RETICCTPCT" in the last 72 hours. Sepsis Labs: Recent Labs  Lab 07/12/23 1118 07/12/23 1215 07/12/23 1557 07/12/23 2149  LATICACIDVEN >9.0* >9.0* 8.1* 3.4*    Recent Results (from the past 240 hour(s))  Resp panel by RT-PCR (RSV, Flu A&B, Covid)  Urine, Clean Catch     Status: None   Collection Time: 07/20/2023  1:30 PM   Specimen: Urine, Clean Catch; Nasal Swab  Result Value Ref Range Status   SARS Coronavirus 2 by RT PCR NEGATIVE NEGATIVE Final    Comment: (NOTE) SARS-CoV-2 target nucleic acids are NOT DETECTED.  The SARS-CoV-2 RNA is generally detectable in upper respiratory specimens during the acute phase of infection. The lowest concentration of SARS-CoV-2 viral copies this assay can detect is 138 copies/mL. A negative result does not preclude SARS-Cov-2 infection and should not be used as the sole basis for treatment or other patient management decisions. A negative result may occur with  improper specimen collection/handling, submission of specimen other than nasopharyngeal swab, presence of viral mutation(s) within the areas targeted by this assay, and inadequate number of viral copies(<138 copies/mL). A negative result must be combined with clinical observations, patient history, and epidemiological information. The expected result is Negative.  Fact Sheet for Patients:  BloggerCourse.com  Fact Sheet for Healthcare Providers:  SeriousBroker.it  This test is no t yet approved or cleared by the [place] FDA and  has been authorized for detection and/or diagnosis of SARS-CoV-2 by FDA under an Emergency Use Authorization (EUA). This EUA will remain  in effect (meaning this test can be used) for the duration of the COVID-19 declaration under Section 564(b)(1) of the Act, 21 U.S.C.section 360bbb-3(b)(1), unless the authorization is terminated  or revoked sooner.       Influenza A by PCR NEGATIVE NEGATIVE Final   Influenza B by PCR NEGATIVE NEGATIVE Final    Comment: (NOTE) The Xpert Xpress SARS-CoV-2/FLU/RSV plus assay is intended as an aid in the diagnosis of influenza from Nasopharyngeal swab specimens and should not be used as a sole basis for treatment.  Nasal washings and aspirates are unacceptable for Xpert Xpress SARS-CoV-2/FLU/RSV testing.  Fact Sheet for Patients: BloggerCourse.com  Fact Sheet for Healthcare Providers: SeriousBroker.it  This test is not yet approved or cleared by the [place] FDA and has been authorized for detection and/or diagnosis of SARS-CoV-2 by FDA under an Emergency Use Authorization (EUA). This EUA will remain in effect (meaning this test can be used) for the duration of the COVID-19 declaration under Section 564(b)(1) of the Act, 21 U.S.C. section 360bbb-3(b)(1), unless the authorization is terminated or revoked.     Resp Syncytial Virus by PCR NEGATIVE NEGATIVE Final    Comment: (NOTE) Fact Sheet for Patients: BloggerCourse.com  Fact Sheet for Healthcare Providers:  SeriousBroker.it  This test is not yet approved or cleared by the [place] and has been authorized for detection and/or diagnosis of SARS-CoV-2 by FDA under an Emergency Use Authorization (EUA). This EUA will remain in effect (meaning this test can be used) for the duration of the COVID-19 declaration under Section 564(b)(1) of the Act, 21 U.S.C. section 360bbb-3(b)(1), unless the authorization is terminated or revoked.  Performed at [facility], [address], [place], [place] [address]   Urine Culture (for pregnant, neutropenic or urologic patients or patients with an indwelling urinary catheter)     Status: Abnormal   Collection Time: 2023-08-03  1:30 PM   Specimen: Urine, Clean Catch  Result Value Ref Range Status   Specimen Description   Final    URINE, CLEAN CATCH Performed at [facility], [address] [place], [place] [address]    Special Requests   Final    NONE Performed at [facility], [address] [place], [place] [address]    Culture  >=100,000 COLONIES/mL KLEBSIELLA OXYTOCA (A)  Final   Report Status 07/13/2023 FINAL  Final   Organism ID, Bacteria KLEBSIELLA OXYTOCA (A)  Final      Susceptibility   Klebsiella oxytoca - MIC*    AMPICILLIN >=32 RESISTANT Resistant     CEFEPIME <=0.12 SENSITIVE Sensitive     CEFTRIAXONE <=0.25 SENSITIVE Sensitive     CIPROFLOXACIN <=0.25 SENSITIVE Sensitive     GENTAMICIN <=1 SENSITIVE Sensitive     IMIPENEM <=0.25 SENSITIVE Sensitive     NITROFURANTOIN 32 SENSITIVE Sensitive     TRIMETH/SULFA <=20 SENSITIVE Sensitive     AMPICILLIN/SULBACTAM 4 SENSITIVE Sensitive     PIP/TAZO <=4 SENSITIVE Sensitive ug/mL    * >=100,000 COLONIES/mL KLEBSIELLA OXYTOCA  Culture, blood (x 2)     Status: None   Collection Time: 07/12/23 11:18 AM   Specimen: BLOOD RIGHT HAND  Result Value Ref Range Status   Specimen Description   Final    BLOOD RIGHT HAND Performed at [facility], [address], [place], [place] [address]    Special Requests   Final    BOTTLES DRAWN AEROBIC AND ANAEROBIC Blood Culture adequate volume Performed at [facility], [address] [place], [place] [address]    Culture   Final    NO GROWTH 5 DAYS Performed at [facility], [address], [place], [place] [address]    Report Status 07/17/2023 FINAL  Final  Culture, blood (x 2)     Status: None   Collection Time: 07/12/23 11:19 AM   Specimen: BLOOD RIGHT ARM  Result Value Ref Range Status   Specimen Description   Final    BLOOD RIGHT ARM Performed at [facility], [address] [place], [place] [address]    Special Requests   Final    BOTTLES DRAWN AEROBIC AND ANAEROBIC Blood Culture adequate volume Performed at [facility], [address] [place], [place] [address]    Culture   Final    NO GROWTH 5 DAYS Performed at  [facility], [address], [place], [place] [address]    Report Status 07/17/2023 FINAL  Final  MRSA Next Gen by PCR, Nasal      Status: None   Collection Time: 07/12/23  3:46 PM   Specimen: Nasal Mucosa; Nasal Swab  Result Value Ref Range Status   MRSA by PCR Next Gen NOT  DETECTED NOT DETECTED Final    Comment: (NOTE) The GeneXpert MRSA Assay (FDA approved for NASAL specimens only), is one component of a comprehensive MRSA colonization surveillance program. It is not intended to diagnose MRSA infection nor to guide or monitor treatment for MRSA infections. Test performance is not FDA approved in patients less than 57 years old. Performed at [facility], [address] [place], [place] [address]          Radiology Studies: No results found.      Scheduled Meds:  sodium chloride   Intravenous Once   calcium carbonate  1 tablet Oral BID WC   CARBOplatin  470 mg Intravenous Once   docusate sodium  100 mg Oral BID   dorzolamide  1 drop Right Eye BID   etoposide  80 mg/m2 (Treatment Plan Recorded) Intravenous Once   feeding supplement (GLUCERNA SHAKE)  237 mL Oral BID BM   insulin aspart  0-15 Units Subcutaneous TID WC   insulin aspart  0-5 Units Subcutaneous QHS   insulin glargine-yfgn  12 Units Subcutaneous Daily   metoprolol tartrate  50 mg Oral BID   mouth rinse  15 mL Mouth Rinse 4 times per day   palonosetron  0.25 mg Intravenous Once   pantoprazole  40 mg Oral BID AC   potassium & sodium phosphates  1 packet Oral TID WC & HS   potassium chloride  40 mEq Oral TID   sodium chloride flush  3 mL Intravenous Q12H   timolol  1 drop Right Eye BID   Continuous Infusions:  dexamethasone (DECADRON) IVPB ([facility])     fosaprepitant (EMEND) 150 mg in sodium chloride 0.9 % 145 mL IVPB       LOS: 7 days    Time spent: 35 minutes    Dorcas Carrow, MD [facility]

## 2023-07-19 NOTE — Progress Notes (Signed)
Chaplain met with William Jones and his daughter to provide emotional and spiritual support.  William Jones wants to keep fighting, but his body is tired.  He laments how much this has "taken [him] down."  His daughter also stated how hard it has been to watch her father go from being so active to where he is at now.  William Jones was looking for counsel on what to do.  Chaplain affirmed that he is doing the things that are called for at this time: expressing his being honest with himself about how he feels about how tired he is, expressing those feelings, spending time with family, and seeking out support.  He requested prayer, which chaplain provided at his bedside with his daughter.  She is the only family he has and neither mentioned additional supports in their lives.  Chaplain will follow up tomorrow, but please page if needs arise.  Chaplain Dyanne Carrel, Bcc PAger, [phone]

## 2023-07-19 NOTE — Plan of Care (Signed)
  Problem: Coping: Goal: Ability to adjust to condition or change in health will improve Outcome: Progressing   Problem: Health Behavior/Discharge Planning: Goal: Ability to identify and utilize available resources and services will improve Outcome: Progressing Goal: Ability to manage health-related needs will improve Outcome: Progressing   Problem: Metabolic: Goal: Ability to maintain appropriate glucose levels will improve Outcome: Progressing   Problem: Nutritional: Goal: Maintenance of adequate nutrition will improve Outcome: Progressing   Problem: Skin Integrity: Goal: Risk for impaired skin integrity will decrease Outcome: Progressing   Problem: Tissue Perfusion: Goal: Adequacy of tissue perfusion will improve Outcome: Progressing   Problem: Education: Goal: Knowledge of General Education information will improve Description: Including pain rating scale, medication(s)/side effects and non-pharmacologic comfort measures Outcome: Progressing

## 2023-07-19 NOTE — Progress Notes (Signed)
Physical Therapy Treatment Patient Details Name: William Jones MRN: [id number] DOB: 1951-01-20 Today's Date: 07/19/2023   History of Present Illness William Jones is a 72 year old M with HTN, HLD, and prostate cancer, recently admitted for generalized weakness, found to have new metastatic neuroendocrine tumor to liver and adrenals.   Was discharged home with plans for outpatient chemo start, but became profoundly weak, returned to the hospital 07/12/23.: 10/8 Patient acutely tachycardic, weak --> Lactate >9 --> transferred to ICU    PT Comments  Pt admitted with above diagnosis.  Pt currently with functional limitations due to the deficits listed below (see PT Problem List). Pt in bed when therapist arrived. Pt reports ongoing issues with elevated HR. Pt at rest HR 124. Pt required CGA, use of hospital bed and increased time for supine to sit and HR elevated to 135. Nurse made aware and administered medication to address HR. Pt seated EOB performing ADL tasks and HR 144. Pt indicated SOB and O2 saturation 97% on RA, BP seated EOB 141/95. Pt required min A x 2 for sit to stand  from elevated EOB, CGA for standing balance and CGA for side stepping to the R toward HOB 2 feet with cues and HR elevating to 160. Pt required max A for sit to supine. Pt HR 137 once in supine. Pt left in bed all needs in place. Patient will benefit from continued inpatient follow up therapy, <3 hours/day. Pt will benefit from acute skilled PT to increase their independence and safety with mobility to allow discharge.      If plan is discharge home, recommend the following: Two people to help with walking and/or transfers;A lot of help with bathing/dressing/bathroom;Assistance with cooking/housework;Assist for transportation;Help with stairs or ramp for entrance   Can travel by private vehicle     No  Equipment Recommendations  None recommended by PT    Recommendations for Other Services       Precautions /  Restrictions Precautions Precautions: Fall Precaution Comments: monitor VS, HR high Restrictions Weight Bearing Restrictions: No     Mobility  Bed Mobility Overal bed mobility: Needs Assistance Bed Mobility: Sit to Supine, Supine to Sit     Supine to sit: HOB elevated, Used rails, Contact guard Sit to supine: Max assist   General bed mobility comments: max A for sit to supine especially for B LE    Transfers Overall transfer level: Needs assistance Equipment used: Rolling walker (2 wheels) Transfers: Sit to/from Stand Sit to Stand: Min assist, From elevated surface, +2 physical assistance, +2 safety/equipment           General transfer comment: assist to power up with cues and pt counting 123    Ambulation/Gait Ambulation/Gait assistance: Contact guard assist Gait Distance (Feet): 2 Feet Assistive device: Rolling walker (2 wheels)   Gait velocity: decreased     General Gait Details: side step to the R toward [facility] with CGA and cues   Stairs             Wheelchair Mobility     Tilt Bed    Modified Rankin (Stroke Patients Only)       Balance Overall balance assessment: Needs assistance Sitting-balance support: Feet supported Sitting balance-Leahy Scale: Fair     Standing balance support: During functional activity, Bilateral upper extremity supported, Reliant on assistive device for balance Standing balance-Leahy Scale: Poor Standing balance comment: reliant on BUE support  Cognition Arousal: Alert Behavior During Therapy: WFL for tasks assessed/performed Overall Cognitive Status: Within Functional Limits for tasks assessed                                          Exercises      General Comments        Pertinent Vitals/Pain      Home Living Family/patient expects to be discharged to:: Private residence Living Arrangements: Spouse/significant other Available Help at Discharge:  Family;Available PRN/intermittently Type of Home: Apartment Home Access: Stairs to enter Entrance Stairs-Rails: Doctor, general practice of Steps: 10   Home Layout: One level Home Equipment: None      Prior Function            PT Goals (current goals can now be found in the care plan section) Acute Rehab PT Goals Patient Stated Goal: to return home and improve strength PT Goal Formulation: With patient Time For Goal Achievement: 07/25/23 Potential to Achieve Goals: Good Progress towards PT goals: Not progressing toward goals - comment (due to medical complexity)    Frequency    Min 1X/week      PT Plan      Co-evaluation PT/OT/SLP Co-Evaluation/Treatment: Yes Reason for Co-Treatment: Complexity of the patient's impairments (multi-system involvement);Necessary to address cognition/behavior during functional activity PT goals addressed during session: Mobility/safety with mobility;Balance;Proper use of DME OT goals addressed during session: ADL's and self-care;Proper use of Adaptive equipment and DME      AM-PAC PT "6 Clicks" Mobility   Outcome Measure  Help needed turning from your back to your side while in a flat bed without using bedrails?: A Little Help needed moving from lying on your back to sitting on the side of a flat bed without using bedrails?: A Little Help needed moving to and from a bed to a chair (including a wheelchair)?: A Lot Help needed standing up from a chair using your arms (e.g., wheelchair or bedside chair)?: A Lot Help needed to walk in hospital room?: Total Help needed climbing 3-5 steps with a railing? : Total 6 Click Score: 12    End of Session Equipment Utilized During Treatment: Gait belt Activity Tolerance: Patient limited by fatigue;Treatment limited secondary to medical complications (Comment) (elevated HR and BP) Patient left: in bed;with call bell/phone within reach;with bed alarm set Nurse Communication: Mobility  status PT Visit Diagnosis: Unsteadiness on feet (R26.81);Other abnormalities of gait and mobility (R26.89);Muscle weakness (generalized) (M62.81);Difficulty in walking, not elsewhere classified (R26.2)     Time: 4270-6237 PT Time Calculation (min) (ACUTE ONLY): 21 min  Charges:    $Therapeutic Activity: 8-22 mins PT General Charges $$ ACUTE PT VISIT: 1 Visit                     Johnny Bridge, PT Acute Rehab    Jacqualyn Posey 07/19/2023, 10:28 AM

## 2023-07-20 DIAGNOSIS — R5383 Other fatigue: Secondary | ICD-10-CM | POA: Diagnosis not present

## 2023-07-20 DIAGNOSIS — R579 Shock, unspecified: Secondary | ICD-10-CM | POA: Diagnosis not present

## 2023-07-20 DIAGNOSIS — N3000 Acute cystitis without hematuria: Secondary | ICD-10-CM | POA: Diagnosis not present

## 2023-07-20 DIAGNOSIS — R531 Weakness: Secondary | ICD-10-CM | POA: Diagnosis not present

## 2023-07-20 LAB — BASIC METABOLIC PANEL
Anion gap: 10 (ref 5–15)
BUN: 20 mg/dL (ref 8–23)
CO2: 25 mmol/L (ref 22–32)
Calcium: 6.9 mg/dL — ABNORMAL LOW (ref 8.9–10.3)
Chloride: 110 mmol/L (ref 98–111)
Creatinine, Ser: 0.4 mg/dL — ABNORMAL LOW (ref 0.61–1.24)
GFR, Estimated: >60 mL/min (ref >60)
Glucose, Bld: 73 mg/dL (ref 70–99)
Potassium: 3 mmol/L — ABNORMAL LOW (ref 3.5–5.1)
Sodium: 145 mmol/L (ref 135–145)

## 2023-07-20 LAB — CBC WITH DIFFERENTIAL/PLATELET
Abs Immature Granulocytes: 0.13 10*3/uL — ABNORMAL HIGH (ref 0.00–0.07)
Basophils Absolute: 0 10*3/uL (ref 0.0–0.1)
Basophils Relative: 0 %
Eosinophils Absolute: 0 10*3/uL (ref 0.0–0.5)
Eosinophils Relative: 0 %
HCT: 19.4 % — ABNORMAL LOW (ref 39.0–52.0)
Hemoglobin: 6.6 g/dL — CL (ref 13.0–17.0)
Immature Granulocytes: 2 %
Lymphocytes Relative: 6 %
Lymphs Abs: 0.5 10*3/uL — ABNORMAL LOW (ref 0.7–4.0)
MCH: 31.3 pg (ref 26.0–34.0)
MCHC: 34 g/dL (ref 30.0–36.0)
MCV: 91.9 fL (ref 80.0–100.0)
Monocytes Absolute: 0.3 10*3/uL (ref 0.1–1.0)
Monocytes Relative: 3 %
Neutro Abs: 7.7 10*3/uL (ref 1.7–7.7)
Neutrophils Relative %: 89 %
Platelets: 100 10*3/uL — ABNORMAL LOW (ref 150–400)
RBC: 2.11 MIL/uL — ABNORMAL LOW (ref 4.22–5.81)
RDW: 15.1 % (ref 11.5–15.5)
WBC: 8.5 10*3/uL (ref 4.0–10.5)
nRBC: 2.3 % — ABNORMAL HIGH (ref 0.0–0.2)

## 2023-07-20 LAB — PREPARE RBC (CROSSMATCH)

## 2023-07-20 LAB — GLUCOSE, CAPILLARY
Glucose-Capillary: 66 mg/dL — ABNORMAL LOW (ref 70–99)
Glucose-Capillary: 128 mg/dL — ABNORMAL HIGH (ref 70–99)
Glucose-Capillary: 168 mg/dL — ABNORMAL HIGH (ref 70–99)
Glucose-Capillary: 95 mg/dL (ref 70–99)
Glucose-Capillary: 70 mg/dL (ref 70–99)

## 2023-07-20 LAB — HEMOGLOBIN AND HEMATOCRIT, BLOOD
HCT: 21.8 % — ABNORMAL LOW (ref 39.0–52.0)
HCT: 24.5 % — ABNORMAL LOW (ref 39.0–52.0)
Hemoglobin: 7.2 g/dL — ABNORMAL LOW (ref 13.0–17.0)
Hemoglobin: 8.1 g/dL — ABNORMAL LOW (ref 13.0–17.0)

## 2023-07-20 LAB — MAGNESIUM: Magnesium: 1.6 mg/dL — ABNORMAL LOW (ref 1.7–2.4)

## 2023-07-20 LAB — PHOSPHORUS: Phosphorus: 1.3 mg/dL — ABNORMAL LOW (ref 2.5–4.6)

## 2023-07-20 MED ORDER — MAGNESIUM SULFATE 4 GM/100ML IV SOLN
4.0000 g | Freq: Once | INTRAVENOUS | Status: AC
  Administered 2023-07-20: 4 g via INTRAVENOUS
  Filled 2023-07-20: qty 100

## 2023-07-20 MED ORDER — SODIUM CHLORIDE 0.9% IV SOLUTION: Freq: Once | INTRAVENOUS | Status: AC

## 2023-07-20 MED ORDER — PEG 3350-KCL-NA BICARB-NACL 420 G PO SOLR
4000.0000 mL | Freq: Once | ORAL | Status: AC
  Administered 2023-07-21: 4000 mL via ORAL

## 2023-07-20 NOTE — TOC Progression Note (Signed)
Transition of Care [facility]) - Progression Note    Patient Details  Name: William Jones MRN: [id number] Date of Birth: 1951/07/25  Transition of Care [facility]) CM/SW Contact  Otelia Santee, LCSW Phone Number: 07/20/2023, 10:38 AM  Clinical Narrative:    Pt's insurance approved for SNF from 10/15 to 10/17. Pt no longer medically stable for transfer. TOC to follow.    Expected Discharge Plan: Skilled Nursing Facility Barriers to Discharge: Continued Medical Work up  Expected Discharge Plan and Services In-house Referral: NA Discharge Planning Services: NA Post Acute Care Choice: Skilled Nursing Facility Living arrangements for the past 2 months: Apartment                                       Social Determinants of Health (SDOH) Interventions SDOH Screenings   Food Insecurity: No Food Insecurity (06/30/2023)  Housing: Patient Declined (06/30/2023)  Transportation Needs: No Transportation Needs (06/30/2023)  Utilities: Not At Risk (06/30/2023)  Alcohol Screen: Low Risk  (10/20/2022)  Depression (PHQ2-9): Low Risk  (06/24/2023)  Financial Resource Strain: Low Risk  (06/25/2020)  Physical Activity: Sufficiently Active (06/25/2020)  Social Connections: Moderately Integrated (06/25/2020)  Stress: No Stress Concern Present (10/20/2022)  Tobacco Use: Medium Risk (07/11/2023)    Readmission Risk Interventions    07/13/2023   11:19 AM 07/13/2023   11:18 AM 07/04/2023    1:50 PM  Readmission Risk Prevention Plan  Transportation Screening  Complete Complete  PCP or Specialist Appt within 3-5 Days   Complete  HRI or Home Care Consult   Complete  Social Work Consult for Recovery Care Planning/Counseling   Complete  Palliative Care Screening   Complete  Medication Review Oceanographer)  Complete Complete  PCP or Specialist appointment within 3-5 days of discharge  Complete   HRI or Home Care Consult  Complete   SW Recovery Care/Counseling Consult  Complete   Palliative  Care Screening Not Applicable Complete   Skilled Nursing Facility  Complete

## 2023-07-20 NOTE — Inpatient Diabetes Management (Signed)
Inpatient Diabetes Program Recommendations  AACE/ADA: New Consensus Statement on Inpatient Glycemic Control (2015)  Target Ranges:  Prepandial:   less than 140 mg/dL      Peak postprandial:   less than 180 mg/dL (1-2 hours)      Critically ill patients:  140 - 180 mg/dL   Lab Results  Component Value Date   GLUCAP 128 (H) 07/20/2023   HGBA1C 6.5 (A) 06/24/2023    Review of Glycemic Control  Diabetes history: DM2 Outpatient Diabetes medications: Jardiance 10 mg QAM Current orders for Inpatient glycemic control: Semglee 12 daily, Novolog 0-15 TID and 0-5 HS  HgbA1C - 6.5% Hypos x past 2 days  Inpatient Diabetes Program Recommendations:    Consider decreasing Semglee to 8 units daily  Follow.  Thank you. Ailene Ards, RD, LDN, CDCES Inpatient Diabetes Coordinator [phone]

## 2023-07-20 NOTE — Progress Notes (Signed)
Chaplain provided follow up care to [facility].  He is feeling some better today and his energy is better.  He felt motivated by our prayer yesterday and this has helped him with his energy today as well as the medication and transfusion. Spiritual Care will continue to follow Mayford Knife, but please page as needs arise.

## 2023-07-20 NOTE — Progress Notes (Addendum)
Progress Note   Patient: William Jones [id number] DOB: [birth year] DOA: 07/19/2023     8 DOS: the patient was seen and examined on 07/20/2023   Brief hospital course: William Jones is a 72 year old M with HTN, HLD, and prostate cancer, recently admitted for generalized weakness, found to have new metastatic neuroendocrine tumor to liver and adrenals.  Was discharged home with plans for outpatient chemo start, but became profoundly weak, returned to the hospital   In the ER, mild transaminitis due to tumor, mild hypokalemia, WBC normal, no fever.  UA with pyuria.    10/6: Started on CTX, admitted for weakness, Xtandi held 10/7: Still weak but stable, Oncology consulted, planned for inpatient chemo 10/8: Patient acutely tachycardic, weak --> Lactate >9 --> transferred to ICU and antibiotics broadened, CCM consulted  Assessment and Plan: Septic shock and lactic acidosis  -Urine culture grew Klebsiella oxytoca.  Blood culture has been negative.   -Patient is on IV Unasyn 3 g every 6 hours.  Patient was previously on vancomycin and meropenem.  Completed antibiotics. -CT angiogram of the chest, CT scan abdomen pelvis 10/8 that showed multifocal pneumonia, possibly, atypical organism; Innumerable liver masses consistent with metastatic disease, multiple adrenal masses and Pancreatic edema.   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   -Oncology following.   -Plans noted for ultimate d/c to SNF followed by continuation of chemo   Hypokalemia: -replaced   Hypophosphatemia: -Phosphorus is replaced..   Hypomagnesemia: -Magnesium remains low, replaced   Hypocalcemia: This is mostly because of chronic illness.  Will start patient on calcium supplementation as long-term therapy.   Anemia: -Patient underwent EGD on 07/25/23 -Hold subcutaneous Lovenox. -GI following.  -Hgb had remained in the 9 range, however on 10/15, hgb noted to  be 6.6 -transfusing 1 unit PRBC -Discussed with GI. Plan for lower endoscopy 10/18. Pt agrees with this   Abnormal liver function test, transaminitis: Shock liver. -had been trending down -Will recheck lft in AM   Essential hypertension: -Cont on metoprolol 50 mg twice daily. -BP stable at this time   Prostate cancer:  -William Jones is on hold for now.       Subjective: Reports feeling well this AM. Denies abd pain. No melena or hematochezia  Physical Exam: Vitals:   07/20/23 1305 07/20/23 1336 07/20/23 1337 07/20/23 1615  BP: 137/73 130/85 130/85 (!) 158/85  Pulse: 74 83 80 75  Resp: 17 15 15    Temp: 98.1 F (36.7 C) 98 F (36.7 C) 98 F (36.7 C) 98.3 F (36.8 C)  TempSrc: Oral  Oral Oral  SpO2: 96%  98% 99%  Weight:      Height:       General exam: Awake, laying in bed, in nad Respiratory system: Normal respiratory effort, no wheezing Cardiovascular system: regular rate, s1, s2 Gastrointestinal system: Soft, nondistended, positive BS Central nervous system: CN2-12 grossly intact, strength intact Extremities: Perfused, no clubbing Skin: Normal skin turgor, no notable skin lesions seen Psychiatry: Mood normal // no visual hallucinations   Data Reviewed:  Labs reviewed: Na 145, K 3.0, Cr 0.40, Hgb 7.2   Family Communication: Pt in room, family at bedside  Disposition: Status is: Inpatient Remains inpatient appropriate because: severity of illness  Planned Discharge Destination: Skilled nursing facility    Author: Rickey Barbara, MD 07/20/2023 6:08 PM  For on call review [email or web address].

## 2023-07-20 NOTE — Progress Notes (Signed)
Nutrition Follow-up  DOCUMENTATION CODES:   Non-severe (moderate) malnutrition in context of chronic illness  INTERVENTION:   -Boost Breeze po TID, each supplement provides 250 kcal and 9 grams of protein   -"High Calorie, High Protein" handout in AVS   NUTRITION DIAGNOSIS:   Moderate Malnutrition related to chronic illness, cancer and cancer related treatments as evidenced by mild fat depletion, moderate muscle depletion, percent weight loss.  Ongoing.  GOAL:   Patient will meet greater than or equal to 90% of their needs  Progressing.  MONITOR:   PO intake, Supplement acceptance, Labs, Weight trends, I & O's  ASSESSMENT:   72 y.o. male with medical history significant of HTN, glaucoma, HLD, and prostate CA presenting with generalized weakness.  He was recently admitted from 9/25-10/1 with generalized weakness.  During that admission, Xtandi for metastatic prostate cancer was held since this can exacerbate weakness.  He was noted to have diffuse hepatic metastatic disease as well as B adrenal mets.  Liver biopsy showed SCC, unsure if primary vs. Metastatic.  He was seen by Dr. Al Pimple on 10/3 and is planning to start [place] etoposide with first dose on 10/8.  10/6: admitted 10/8: started chemotherapy 10/11: EGD: erosive gastropathy  Patient currently on regular diet. Will be on clears 10/17 and NPO 10/18 in anticipation of colonoscopy.  Boost Breeze was ordered and pt accepting.  Currently consuming 100% of meals.  Admission weight: 150 lbs Current weight: 150 lbs   Medications: Tums, Colace,  PHOS-NAK, KLOR-CON, Florastor, IV Mg sulfate  Labs reviewed: CBGs: 66-318 Low K Low Mg Low Phos   Diet Order:   Diet Order             Diet NPO time specified  Diet effective midnight           Diet clear liquid Room service appropriate? Yes; Fluid consistency: Thin  Diet effective midnight           Diet regular Room service appropriate? Yes; Fluid consistency: Thin   Diet effective now                   EDUCATION NEEDS:   Education needs have been addressed  Skin:  Skin Assessment: Reviewed RN Assessment  Last BM:  10/16 -type 7  Height:   Ht Readings from Last 1 Encounters:  07/15/23 5\' 9"  (1.753 m)    Weight:   Wt Readings from Last 1 Encounters:  07/15/23 68 kg    BMI:  Body mass index is 22.15 kg/m.  Estimated Nutritional Needs:   Kcal:  2050-2250  Protein:  100-115g  Fluid:  2.2L/day   Tilda Franco, MS, RD, LDN Inpatient Clinical Dietitian Contact information available via Am[employer]

## 2023-07-20 NOTE — Progress Notes (Signed)
PT Cancellation Note  Patient Details Name: William Jones MRN: [id number] DOB: 03-12-1951   Cancelled Treatment:    Reason Eval/Treat Not Completed: Medical issues which prohibited therapy Pt has very weak.  Hgb was 6.6 then 7.2 when retested.  Pt currently getting PRBC.  Considering level of assist, HR typically elevates with PT, and currently getting blood - will f/u later date. Anise Salvo, PT Acute Rehab [facility] [phone]   Rayetta Humphrey 07/20/2023, 3:24 PM

## 2023-07-20 NOTE — Plan of Care (Signed)
  Problem: Coping: Goal: Ability to adjust to condition or change in health will improve Outcome: Progressing   Problem: Nutritional: Goal: Maintenance of adequate nutrition will improve Outcome: Progressing   Problem: Skin Integrity: Goal: Risk for impaired skin integrity will decrease Outcome: Progressing   Problem: Education: Goal: Knowledge of General Education information will improve Description: Including pain rating scale, medication(s)/side effects and non-pharmacologic comfort measures Outcome: Progressing   Problem: Activity: Goal: Risk for activity intolerance will decrease Outcome: Progressing   Problem: Nutrition: Goal: Adequate nutrition will be maintained Outcome: Progressing

## 2023-07-20 NOTE — Plan of Care (Signed)
  Problem: Coping: Goal: Ability to adjust to condition or change in health will improve Outcome: Progressing   Problem: Clinical Measurements: Goal: Respiratory complications will improve Outcome: Progressing Goal: Cardiovascular complication will be avoided Outcome: Progressing   Problem: Safety: Goal: Ability to remain free from injury will improve Outcome: Progressing

## 2023-07-21 ENCOUNTER — Other Ambulatory Visit (HOSPITAL_COMMUNITY): Payer: 59

## 2023-07-21 ENCOUNTER — Encounter: Payer: 59 | Admitting: Student

## 2023-07-21 ENCOUNTER — Encounter (HOSPITAL_COMMUNITY): Payer: Self-pay

## 2023-07-21 DIAGNOSIS — R579 Shock, unspecified: Secondary | ICD-10-CM | POA: Diagnosis not present

## 2023-07-21 DIAGNOSIS — R531 Weakness: Secondary | ICD-10-CM | POA: Diagnosis not present

## 2023-07-21 DIAGNOSIS — R5383 Other fatigue: Secondary | ICD-10-CM | POA: Diagnosis not present

## 2023-07-21 DIAGNOSIS — N3000 Acute cystitis without hematuria: Secondary | ICD-10-CM | POA: Diagnosis not present

## 2023-07-21 LAB — COMPREHENSIVE METABOLIC PANEL
ALT: 197 U/L — ABNORMAL HIGH (ref 0–44)
AST: 48 U/L — ABNORMAL HIGH (ref 15–41)
Albumin: 1.8 g/dL — ABNORMAL LOW (ref 3.5–5.0)
Alkaline Phosphatase: 107 U/L (ref 38–126)
Anion gap: 9 (ref 5–15)
BUN: 16 mg/dL (ref 8–23)
CO2: 23 mmol/L (ref 22–32)
Calcium: 6.6 mg/dL — ABNORMAL LOW (ref 8.9–10.3)
Chloride: 109 mmol/L (ref 98–111)
Creatinine, Ser: 0.44 mg/dL — ABNORMAL LOW (ref 0.61–1.24)
GFR, Estimated: >60 mL/min (ref >60)
Glucose, Bld: 78 mg/dL (ref 70–99)
Potassium: 2.8 mmol/L — ABNORMAL LOW (ref 3.5–5.1)
Sodium: 141 mmol/L (ref 135–145)
Total Bilirubin: 0.7 mg/dL (ref 0.3–1.2)
Total Protein: 4.5 g/dL — ABNORMAL LOW (ref 6.5–8.1)

## 2023-07-21 LAB — TYPE AND SCREEN
ABO/RH(D): A POS
Antibody Screen: NEGATIVE
Unit division: 0

## 2023-07-21 LAB — GLUCOSE, CAPILLARY
Glucose-Capillary: 65 mg/dL — ABNORMAL LOW (ref 70–99)
Glucose-Capillary: 99 mg/dL (ref 70–99)
Glucose-Capillary: 140 mg/dL — ABNORMAL HIGH (ref 70–99)
Glucose-Capillary: 93 mg/dL (ref 70–99)
Glucose-Capillary: 73 mg/dL (ref 70–99)

## 2023-07-21 LAB — BPAM RBC
Blood Product Expiration Date: 202411092359
ISSUE DATE / TIME: 202410161315
Unit Type and Rh: 6200

## 2023-07-21 LAB — CBC
HCT: 24.8 % — ABNORMAL LOW (ref 39.0–52.0)
Hemoglobin: 8.1 g/dL — ABNORMAL LOW (ref 13.0–17.0)
MCH: 30.7 pg (ref 26.0–34.0)
MCHC: 32.7 g/dL (ref 30.0–36.0)
MCV: 93.9 fL (ref 80.0–100.0)
Platelets: 98 10*3/uL — ABNORMAL LOW (ref 150–400)
RBC: 2.64 MIL/uL — ABNORMAL LOW (ref 4.22–5.81)
RDW: 15.9 % — ABNORMAL HIGH (ref 11.5–15.5)
WBC: 9.5 10*3/uL (ref 4.0–10.5)
nRBC: 2.2 % — ABNORMAL HIGH (ref 0.0–0.2)

## 2023-07-21 LAB — MAGNESIUM: Magnesium: 1.8 mg/dL (ref 1.7–2.4)

## 2023-07-21 MED ORDER — INSULIN GLARGINE-YFGN 100 UNIT/ML ~~LOC~~ SOLN
8.0000 [IU] | Freq: Every day | SUBCUTANEOUS | Status: DC
  Administered 2023-07-21 – 2023-07-28 (×7): 8 [IU] via SUBCUTANEOUS
  Filled 2023-07-21 (×9): qty 0.08

## 2023-07-21 MED ORDER — LOPERAMIDE HCL 2 MG PO CAPS
2.0000 mg | ORAL_CAPSULE | ORAL | Status: DC | PRN
  Administered 2023-07-21 – 2023-07-26 (×2): 2 mg via ORAL
  Filled 2023-07-21 (×2): qty 1

## 2023-07-21 MED ORDER — MAGNESIUM SULFATE 2 GM/50ML IV SOLN
2.0000 g | Freq: Once | INTRAVENOUS | Status: AC
  Administered 2023-07-21: 2 g via INTRAVENOUS
  Filled 2023-07-21: qty 50

## 2023-07-21 MED ORDER — POTASSIUM CHLORIDE 10 MEQ/100ML IV SOLN
10.0000 meq | INTRAVENOUS | Status: DC
  Administered 2023-07-21: 10 meq via INTRAVENOUS
  Filled 2023-07-21: qty 100

## 2023-07-21 MED ORDER — POTASSIUM CHLORIDE CRYS ER 20 MEQ PO TBCR
60.0000 meq | EXTENDED_RELEASE_TABLET | ORAL | Status: AC
  Administered 2023-07-21 (×2): 60 meq via ORAL
  Filled 2023-07-21 (×2): qty 3

## 2023-07-21 NOTE — Plan of Care (Signed)
  Problem: Education: Goal: Ability to describe self-care measures that may prevent or decrease complications (Diabetes Survival Skills Education) will improve Outcome: Progressing Goal: Individualized Educational Video(s) Outcome: Progressing   Problem: Coping: Goal: Ability to adjust to condition or change in health will improve Outcome: Progressing   Problem: Fluid Volume: Goal: Ability to maintain a balanced intake and output will improve Outcome: Progressing   Problem: Health Behavior/Discharge Planning: Goal: Ability to identify and utilize available resources and services will improve Outcome: Progressing Goal: Ability to manage health-related needs will improve Outcome: Progressing   Problem: Metabolic: Goal: Ability to maintain appropriate glucose levels will improve Outcome: Progressing   Problem: Nutritional: Goal: Maintenance of adequate nutrition will improve Outcome: Progressing Goal: Progress toward achieving an optimal weight will improve Outcome: Progressing   Problem: Skin Integrity: Goal: Risk for impaired skin integrity will decrease Outcome: Progressing   Problem: Tissue Perfusion: Goal: Adequacy of tissue perfusion will improve Outcome: Progressing   Problem: Education: Goal: Knowledge of General Education information will improve Description: Including pain rating scale, medication(s)/side effects and non-pharmacologic comfort measures Outcome: Progressing   Problem: Health Behavior/Discharge Planning: Goal: Ability to manage health-related needs will improve Outcome: Progressing   Problem: Clinical Measurements: Goal: Ability to maintain clinical measurements within normal limits will improve Outcome: Progressing Goal: Will remain free from infection Outcome: Progressing Goal: Diagnostic test results will improve Outcome: Progressing Goal: Respiratory complications will improve Outcome: Progressing Goal: Cardiovascular complication will  be avoided Outcome: Progressing   Problem: Activity: Goal: Risk for activity intolerance will decrease Outcome: Progressing   Problem: Nutrition: Goal: Adequate nutrition will be maintained Outcome: Progressing   Problem: Coping: Goal: Level of anxiety will decrease Outcome: Progressing   Problem: Elimination: Goal: Will not experience complications related to bowel motility Outcome: Progressing Goal: Will not experience complications related to urinary retention Outcome: Progressing   Problem: Pain Managment: Goal: General experience of comfort will improve Outcome: Progressing   Problem: Safety: Goal: Ability to remain free from injury will improve Outcome: Progressing   Problem: Skin Integrity: Goal: Risk for impaired skin integrity will decrease Outcome: Progressing   Problem: Fluid Volume: Goal: Hemodynamic stability will improve Outcome: Progressing   Problem: Clinical Measurements: Goal: Diagnostic test results will improve Outcome: Progressing Goal: Signs and symptoms of infection will decrease Outcome: Progressing   Problem: Respiratory: Goal: Ability to maintain adequate ventilation will improve Outcome: Progressing   Problem: Education: Goal: Knowledge of the prescribed therapeutic regimen will improve Outcome: Progressing   Problem: Activity: Goal: Ability to implement measures to reduce episodes of fatigue will improve Outcome: Progressing   Problem: Bowel/Gastric: Goal: Will not experience complications related to bowel motility Outcome: Progressing   Problem: Coping: Goal: Ability to identify and develop effective coping behavior will improve Outcome: Progressing   Problem: Nutritional: Goal: Maintenance of adequate nutrition will improve Outcome: Progressing   Problem: Education: Goal: Understanding of post-operative needs will improve Outcome: Progressing Goal: Individualized Educational Video(s) Outcome: Progressing   Problem:  Clinical Measurements: Goal: Postoperative complications will be avoided or minimized Outcome: Progressing   Problem: Respiratory: Goal: Will regain and/or maintain adequate ventilation Outcome: Progressing

## 2023-07-21 NOTE — Plan of Care (Signed)
  Problem: Coping: Goal: Ability to adjust to condition or change in health will improve Outcome: Progressing   Problem: Fluid Volume: Goal: Ability to maintain a balanced intake and output will improve Outcome: Progressing   Problem: Health Behavior/Discharge Planning: Goal: Ability to identify and utilize available resources and services will improve Outcome: Progressing   Problem: Metabolic: Goal: Ability to maintain appropriate glucose levels will improve Outcome: Progressing   Problem: Nutritional: Goal: Maintenance of adequate nutrition will improve Outcome: Progressing   Problem: Skin Integrity: Goal: Risk for impaired skin integrity will decrease Outcome: Progressing   Problem: Tissue Perfusion: Goal: Adequacy of tissue perfusion will improve Outcome: Progressing   Problem: Education: Goal: Knowledge of General Education information will improve Description: Including pain rating scale, medication(s)/side effects and non-pharmacologic comfort measures Outcome: Progressing   Problem: Clinical Measurements: Goal: Ability to maintain clinical measurements within normal limits will improve Outcome: Progressing   Problem: Pain Managment: Goal: General experience of comfort will improve Outcome: Progressing   Problem: Safety: Goal: Ability to remain free from injury will improve Outcome: Progressing   Problem: Fluid Volume: Goal: Hemodynamic stability will improve Outcome: Progressing

## 2023-07-21 NOTE — Progress Notes (Signed)
Physical Therapy Treatment Patient Details Name: William Jones MRN: [id number] DOB: 1951-03-10 Today's Date: 07/21/2023   History of Present Illness Mr. Argomaniz is a 72 year old M with HTN, HLD, and prostate cancer, recently admitted for generalized weakness, found to have new metastatic neuroendocrine tumor to liver and adrenals.   Was discharged home with plans for outpatient chemo start, but became profoundly weak, returned to the hospital 07/12/23.: 10/8 Patient acutely tachycardic, weak --> Lactate >9 --> transferred to ICU    PT Comments  Pt required 3 attempts before he was able to successfully perform sit to stand from a highly elevated bed with max assist. He was then able to ambulate 60' with RW, no loss of balance, distance limited by fatigue, HR 95 walking. Pt tolerated increased activity level today.     If plan is discharge home, recommend the following: A lot of help with walking and/or transfers;A lot of help with bathing/dressing/bathroom;Assistance with cooking/housework;Help with stairs or ramp for entrance;Direct supervision/assist for medications management;Assist for transportation   Can travel by private vehicle     No  Equipment Recommendations  None recommended by PT    Recommendations for Other Services       Precautions / Restrictions Precautions Precautions: Fall Precaution Comments: monitor VS, HR high Restrictions Weight Bearing Restrictions: No     Mobility  Bed Mobility Overal bed mobility: Needs Assistance Bed Mobility: Supine to Sit     Supine to sit: Min assist, HOB elevated, Used rails     General bed mobility comments: min A to raise trunk    Transfers Overall transfer level: Needs assistance Equipment used: Rolling walker (2 wheels) Transfers: Sit to/from Stand Sit to Stand: Max assist, From elevated surface           General transfer comment: took 3 attempts to successfully power up to stand from highly elevated bed,  Verbal cues for hand/foot placement    Ambulation/Gait Ambulation/Gait assistance: Contact guard assist Gait Distance (Feet): 60 Feet Assistive device: Rolling walker (2 wheels) Gait Pattern/deviations: Step-through pattern, Decreased step length - right, Decreased step length - left, Narrow base of support Gait velocity: decreased     General Gait Details: steady, no loss of balance, distance limited by fatigue, HR 95 with walking   Stairs             Wheelchair Mobility     Tilt Bed    Modified Rankin (Stroke Patients Only)       Balance Overall balance assessment: Needs assistance Sitting-balance support: Feet supported Sitting balance-Leahy Scale: Fair     Standing balance support: During functional activity, Bilateral upper extremity supported, Reliant on assistive device for balance Standing balance-Leahy Scale: Poor                              Cognition Arousal: Alert Behavior During Therapy: WFL for tasks assessed/performed Overall Cognitive Status: Within Functional Limits for tasks assessed                                 General Comments: some stuttering wiht speech. overall appropriate. increased time to process commands        Exercises General Exercises - Upper Extremity Shoulder Flexion: AROM, Both, 10 reps, Seated General Exercises - Lower Extremity Ankle Circles/Pumps: AROM, 10 reps, Supine, Both    General Comments  Pertinent Vitals/Pain Pain Assessment Pain Assessment: No/denies pain    Home Living                          Prior Function            PT Goals (current goals can now be found in the care plan section) Acute Rehab PT Goals Patient Stated Goal: to return home and improve strength PT Goal Formulation: With patient Time For Goal Achievement: 07/25/23 Potential to Achieve Goals: Good Progress towards PT goals: Progressing toward goals    Frequency    Min  1X/week      PT Plan      Co-evaluation              AM-PAC PT "6 Clicks" Mobility   Outcome Measure  Help needed turning from your back to your side while in a flat bed without using bedrails?: A Little Help needed moving from lying on your back to sitting on the side of a flat bed without using bedrails?: A Lot Help needed moving to and from a bed to a chair (including a wheelchair)?: A Lot Help needed standing up from a chair using your arms (e.g., wheelchair or bedside chair)?: A Lot Help needed to walk in hospital room?: A Little Help needed climbing 3-5 steps with a railing? : Total 6 Click Score: 13    End of Session Equipment Utilized During Treatment: Gait belt Activity Tolerance: Patient limited by fatigue Patient left: with call bell/phone within reach;in chair;with chair alarm set Nurse Communication: Mobility status PT Visit Diagnosis: Unsteadiness on feet (R26.81);Other abnormalities of gait and mobility (R26.89);Muscle weakness (generalized) (M62.81);Difficulty in walking, not elsewhere classified (R26.2)     Time: 7829-5621 PT Time Calculation (min) (ACUTE ONLY): 20 min  Charges:    $Gait Training: 8-22 mins PT General Charges $$ ACUTE PT VISIT: 1 Visit                    Tamala Ser PT 07/21/2023  Acute Rehabilitation Services  Office [phone]

## 2023-07-21 NOTE — Progress Notes (Signed)
Progress Note   Patient: William Jones:[id number] DOB: Jul 23, 1951 DOA: 07/09/2023     9 DOS: the patient was seen and examined on 07/21/2023   Brief hospital course: [name] is a 72 year old M with HTN, HLD, and prostate cancer, recently admitted for generalized weakness, found to have new metastatic neuroendocrine tumor to liver and adrenals.  Was discharged home with plans for outpatient chemo start, but became profoundly weak, returned to the hospital   In the ER, mild transaminitis due to tumor, mild hypokalemia, WBC normal, no fever.  UA with pyuria.    10/6: Started on CTX, admitted for weakness, Xtandi held 10/7: Still weak but stable, Oncology consulted, planned for inpatient chemo 10/8: Patient acutely tachycardic, weak --> Lactate >9 --> transferred to ICU and antibiotics broadened, CCM consulted  Assessment and Plan: Septic shock and lactic acidosis  -Urine culture grew Klebsiella oxytoca.  Blood culture has been negative.   -Patient is on IV Unasyn 3 g every 6 hours.  Patient was previously on vancomycin and meropenem.  Completed antibiotics. -CT angiogram of the chest, CT scan abdomen pelvis 10/8 that showed multifocal pneumonia, possibly, atypical organism; Innumerable liver masses consistent with metastatic disease, multiple adrenal masses and Pancreatic edema.   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   -Oncology had been following.   -Plans noted for ultimate d/c to SNF followed by continuation of chemo   Hypokalemia: -remains low, will replace   Hypophosphatemia: -Phosphorus is replaced..   Hypomagnesemia: -Magnesium remains low, replaced   Hypocalcemia: This is mostly because of chronic illness.   -Continue on calcium supplementation as long-term therapy.   Anemia: -Patient underwent EGD on August 12, 2023 -Hold subcutaneous Lovenox. -GI following.  -Hgb had remained in the 9 range, however on  10/15, hgb noted to be 6.6, s/p 1 unit PRBC -GI plans for lower endoscopy 10/18. Pt agrees with this   Abnormal liver function test, transaminitis: Shock liver. -had been trending down -Will recheck lft in AM   Essential hypertension: -Cont on metoprolol 50 mg twice daily. -BP stable at this time   Prostate cancer:  -[name] is on hold for now.       Subjective: Reporting loose stools after eating  Physical Exam: Vitals:   07/20/23 1615 07/20/23 2223 07/21/23 0442 07/21/23 1311  BP: (!) 158/85 (!) 140/83 (!) 155/84 (!) 160/91  Pulse: 75 90 72 71  Resp:  20 17 16   Temp: 98.3 F (36.8 C) 97.9 F (36.6 C) 97.6 F (36.4 C) 97.8 F (36.6 C)  TempSrc: Oral  Oral   SpO2: 99% 95% 95% 90%  Weight:      Height:       General exam: Conversant, in no acute distress Respiratory system: normal chest rise, clear, no audible wheezing Cardiovascular system: regular rhythm, s1-s2 Gastrointestinal system: Nondistended, nontender, pos BS Central nervous system: No seizures, no tremors Extremities: No cyanosis, no joint deformities Skin: No rashes, no pallor Psychiatry: Affect normal // no auditory hallucinations   Data Reviewed:  Labs reviewed: Na 141, K 2.8, Cr 0.44, WBC 9.5, Hgb 8.1   Family Communication: Pt in room, family not at bedside  Disposition: Status is: Inpatient Remains inpatient appropriate because: severity of illness  Planned Discharge Destination: Skilled nursing facility    Author: Rickey Barbara, MD 07/21/2023 4:37 PM  For on call review [email or web address].

## 2023-07-21 NOTE — H&P (View-Only) (Signed)
Progress Note   Patient: William Jones:[id number] DOB: Jul 23, 1951 DOA: 07/09/2023     9 DOS: the patient was seen and examined on 07/21/2023   Brief hospital course: William Jones is a 72 year old M with HTN, HLD, and prostate cancer, recently admitted for generalized weakness, found to have new metastatic neuroendocrine tumor to liver and adrenals.  Was discharged home with plans for outpatient chemo start, but became profoundly weak, returned to the hospital   In the ER, mild transaminitis due to tumor, mild hypokalemia, WBC normal, no fever.  UA with pyuria.    10/6: Started on CTX, admitted for weakness, Xtandi held 10/7: Still weak but stable, Oncology consulted, planned for inpatient chemo 10/8: Patient acutely tachycardic, weak --> Lactate >9 --> transferred to ICU and antibiotics broadened, CCM consulted  Assessment and Plan: Septic shock and lactic acidosis  -Urine culture grew Klebsiella oxytoca.  Blood culture has been negative.   -Patient is on IV Unasyn 3 g every 6 hours.  Patient was previously on vancomycin and meropenem.  Completed antibiotics. -CT angiogram of the chest, CT scan abdomen pelvis 10/8 that showed multifocal pneumonia, possibly, atypical organism; Innumerable liver masses consistent with metastatic disease, multiple adrenal masses and Pancreatic edema.   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   -Oncology had been following.   -Plans noted for ultimate d/c to SNF followed by continuation of chemo   Hypokalemia: -remains low, will replace   Hypophosphatemia: -Phosphorus is replaced..   Hypomagnesemia: -Magnesium remains low, replaced   Hypocalcemia: This is mostly because of chronic illness.   -Continue on calcium supplementation as long-term therapy.   Anemia: -Patient underwent EGD on August 12, 2023 -Hold subcutaneous Lovenox. -GI following.  -Hgb had remained in the 9 range, however on  10/15, hgb noted to be 6.6, s/p 1 unit PRBC -GI plans for lower endoscopy 10/18. Pt agrees with this   Abnormal liver function test, transaminitis: Shock liver. -had been trending down -Will recheck lft in AM   Essential hypertension: -Cont on metoprolol 50 mg twice daily. -BP stable at this time   Prostate cancer:  -William Jones is on hold for now.       Subjective: Reporting loose stools after eating  Physical Exam: Vitals:   07/20/23 1615 07/20/23 2223 07/21/23 0442 07/21/23 1311  BP: (!) 158/85 (!) 140/83 (!) 155/84 (!) 160/91  Pulse: 75 90 72 71  Resp:  20 17 16   Temp: 98.3 F (36.8 C) 97.9 F (36.6 C) 97.6 F (36.4 C) 97.8 F (36.6 C)  TempSrc: Oral  Oral   SpO2: 99% 95% 95% 90%  Weight:      Height:       General exam: Conversant, in no acute distress Respiratory system: normal chest rise, clear, no audible wheezing Cardiovascular system: regular rhythm, s1-s2 Gastrointestinal system: Nondistended, nontender, pos BS Central nervous system: No seizures, no tremors Extremities: No cyanosis, no joint deformities Skin: No rashes, no pallor Psychiatry: Affect normal // no auditory hallucinations   Data Reviewed:  Labs reviewed: Na 141, K 2.8, Cr 0.44, WBC 9.5, Hgb 8.1   Family Communication: Pt in room, family not at bedside  Disposition: Status is: Inpatient Remains inpatient appropriate because: severity of illness  Planned Discharge Destination: Skilled nursing facility    Author: Rickey Barbara, MD 07/21/2023 4:37 PM  For on call review [email or web address].

## 2023-07-22 ENCOUNTER — Inpatient Hospital Stay (HOSPITAL_COMMUNITY): Payer: 59 | Admitting: Anesthesiology

## 2023-07-22 ENCOUNTER — Other Ambulatory Visit: Payer: Self-pay

## 2023-07-22 ENCOUNTER — Encounter (HOSPITAL_COMMUNITY): Payer: Self-pay | Admitting: Family Medicine

## 2023-07-22 ENCOUNTER — Encounter (HOSPITAL_COMMUNITY): Admission: EM | Disposition: E | Payer: Self-pay | Source: Home / Self Care | Attending: Internal Medicine

## 2023-07-22 DIAGNOSIS — N3000 Acute cystitis without hematuria: Secondary | ICD-10-CM | POA: Diagnosis not present

## 2023-07-22 DIAGNOSIS — R531 Weakness: Secondary | ICD-10-CM | POA: Diagnosis not present

## 2023-07-22 DIAGNOSIS — N189 Chronic kidney disease, unspecified: Secondary | ICD-10-CM | POA: Diagnosis not present

## 2023-07-22 DIAGNOSIS — R579 Shock, unspecified: Secondary | ICD-10-CM | POA: Diagnosis not present

## 2023-07-22 DIAGNOSIS — R5383 Other fatigue: Secondary | ICD-10-CM | POA: Diagnosis not present

## 2023-07-22 DIAGNOSIS — K5730 Diverticulosis of large intestine without perforation or abscess without bleeding: Secondary | ICD-10-CM | POA: Diagnosis not present

## 2023-07-22 DIAGNOSIS — E1122 Type 2 diabetes mellitus with diabetic chronic kidney disease: Secondary | ICD-10-CM

## 2023-07-22 DIAGNOSIS — I129 Hypertensive chronic kidney disease with stage 1 through stage 4 chronic kidney disease, or unspecified chronic kidney disease: Secondary | ICD-10-CM | POA: Diagnosis not present

## 2023-07-22 HISTORY — PX: COLONOSCOPY WITH PROPOFOL: SHX5780

## 2023-07-22 LAB — CBC
HCT: 24.5 % — ABNORMAL LOW (ref 39.0–52.0)
Hemoglobin: 8.1 g/dL — ABNORMAL LOW (ref 13.0–17.0)
MCH: 31 pg (ref 26.0–34.0)
MCHC: 33.1 g/dL (ref 30.0–36.0)
MCV: 93.9 fL (ref 80.0–100.0)
Platelets: 100 10*3/uL — ABNORMAL LOW (ref 150–400)
RBC: 2.61 MIL/uL — ABNORMAL LOW (ref 4.22–5.81)
RDW: 16.3 % — ABNORMAL HIGH (ref 11.5–15.5)
WBC: 9.4 10*3/uL (ref 4.0–10.5)
nRBC: 1.6 % — ABNORMAL HIGH (ref 0.0–0.2)

## 2023-07-22 LAB — COMPREHENSIVE METABOLIC PANEL
ALT: 163 U/L — ABNORMAL HIGH (ref 0–44)
AST: 58 U/L — ABNORMAL HIGH (ref 15–41)
Albumin: 1.8 g/dL — ABNORMAL LOW (ref 3.5–5.0)
Alkaline Phosphatase: 137 U/L — ABNORMAL HIGH (ref 38–126)
Anion gap: 8 (ref 5–15)
BUN: 9 mg/dL (ref 8–23)
CO2: 24 mmol/L (ref 22–32)
Calcium: 5.9 mg/dL — CL (ref 8.9–10.3)
Chloride: 106 mmol/L (ref 98–111)
Creatinine, Ser: 0.42 mg/dL — ABNORMAL LOW (ref 0.61–1.24)
GFR, Estimated: >60 mL/min (ref >60)
Glucose, Bld: 95 mg/dL (ref 70–99)
Potassium: 2.5 mmol/L — CL (ref 3.5–5.1)
Sodium: 138 mmol/L (ref 135–145)
Total Bilirubin: 0.7 mg/dL (ref 0.3–1.2)
Total Protein: 4.6 g/dL — ABNORMAL LOW (ref 6.5–8.1)

## 2023-07-22 LAB — GLUCOSE, CAPILLARY
Glucose-Capillary: 111 mg/dL — ABNORMAL HIGH (ref 70–99)
Glucose-Capillary: 239 mg/dL — ABNORMAL HIGH (ref 70–99)
Glucose-Capillary: 77 mg/dL (ref 70–99)
Glucose-Capillary: 85 mg/dL (ref 70–99)
Glucose-Capillary: 89 mg/dL (ref 70–99)

## 2023-07-22 LAB — MAGNESIUM: Magnesium: 1.8 mg/dL (ref 1.7–2.4)

## 2023-07-22 SURGERY — COLONOSCOPY WITH PROPOFOL
Anesthesia: Monitor Anesthesia Care

## 2023-07-22 MED ORDER — POTASSIUM CHLORIDE 10 MEQ/100ML IV SOLN
10.0000 meq | INTRAVENOUS | Status: AC
  Administered 2023-07-22 (×5): 10 meq via INTRAVENOUS
  Filled 2023-07-22 (×5): qty 100

## 2023-07-22 MED ORDER — PROPOFOL 10 MG/ML IV BOLUS
INTRAVENOUS | Status: DC | PRN
  Administered 2023-07-22 (×2): 10 mg via INTRAVENOUS

## 2023-07-22 MED ORDER — LIDOCAINE HCL 1 % IJ SOLN
INTRAMUSCULAR | Status: DC | PRN
  Administered 2023-07-22: 50 mg via INTRADERMAL
  Administered 2023-07-22: 25 mg via INTRADERMAL

## 2023-07-22 MED ORDER — PROPOFOL 500 MG/50ML IV EMUL
INTRAVENOUS | Status: DC | PRN
  Administered 2023-07-22: 130 ug/kg/min via INTRAVENOUS

## 2023-07-22 MED ORDER — POTASSIUM CHLORIDE CRYS ER 20 MEQ PO TBCR: 60.0000 meq | EXTENDED_RELEASE_TABLET | Freq: Two times a day (BID) | ORAL | Status: DC

## 2023-07-22 MED ORDER — SODIUM CHLORIDE 0.9% FLUSH
10.0000 mL | Freq: Two times a day (BID) | INTRAVENOUS | Status: DC
  Administered 2023-07-22: 10 mL via INTRAVENOUS

## 2023-07-22 MED ORDER — CALCIUM GLUCONATE-NACL 2-0.675 GM/100ML-% IV SOLN
2.0000 g | Freq: Once | INTRAVENOUS | Status: AC
  Administered 2023-07-22: 2000 mg via INTRAVENOUS
  Filled 2023-07-22: qty 100

## 2023-07-22 MED ORDER — POTASSIUM CHLORIDE IN NACL 40-0.9 MEQ/L-% IV SOLN
INTRAVENOUS | Status: DC
  Filled 2023-07-22 (×2): qty 1000

## 2023-07-22 MED ORDER — MAGNESIUM SULFATE 2 GM/50ML IV SOLN
2.0000 g | Freq: Once | INTRAVENOUS | Status: AC
  Administered 2023-07-22: 2 g via INTRAVENOUS
  Filled 2023-07-22: qty 50

## 2023-07-22 MED ORDER — CALCIUM CARBONATE ANTACID 500 MG PO CHEW
1.0000 | CHEWABLE_TABLET | Freq: Two times a day (BID) | ORAL | Status: DC
  Administered 2023-07-23 – 2023-07-26 (×6): 200 mg via ORAL
  Filled 2023-07-22 (×6): qty 1

## 2023-07-22 SURGICAL SUPPLY — 22 items

## 2023-07-22 NOTE — Op Note (Addendum)
[facility] Patient Name: William Jones Procedure Date: 07/30/2023 MRN: [id number] Attending MD: Jeani Hawking , MD, [id number] Date of Birth: May 07, 1951 CSN: [id number] Age: 72 Admit Type: Inpatient Procedure:                Colonoscopy Indications:              Hematochezia, Acute post hemorrhagic anemia Providers:                Jeani Hawking, MD, Jacquelyn "Jaci" Clelia Croft, RN, Harrington Challenger, Technician Referring MD:              Medicines:                Propofol per Anesthesia Complications:            No immediate complications. Estimated Blood Loss:     Estimated blood loss: none. Procedure:                Pre-Anesthesia Assessment:                           - Prior to the procedure, a History and Physical                            was performed, and patient medications and                            allergies were reviewed. The patient's tolerance of                            previous anesthesia was also reviewed. The risks                            and benefits of the procedure and the sedation                            options and risks were discussed with the patient.                            All questions were answered, and informed consent                            was obtained. Prior Anticoagulants: The patient has                            taken no anticoagulant or antiplatelet agents. ASA                            Grade Assessment: IV - A patient with severe                            systemic disease that is a constant threat to life.  After reviewing the risks and benefits, the patient                            was deemed in satisfactory condition to undergo the                            procedure.                           - Sedation was administered by an anesthesia                            professional. Deep sedation was attained.                           After obtaining informed  consent, the colonoscope                            was passed under direct vision. Throughout the                            procedure, the patient's blood pressure, pulse, and                            oxygen saturations were monitored continuously. The                            CF-HQ190L ([id number]) Olympus colonoscope was                            introduced through the anus and advanced to the the                            cecum, identified by appendiceal orifice and                            ileocecal valve. The colonoscopy was performed                            without difficulty. The patient tolerated the                            procedure well. The quality of the bowel                            preparation was evaluated using the BBPS [facility]                            Bowel Preparation Scale) with scores of: Right                            Colon = 3 (entire mucosa seen well with no residual  staining, small fragments of stool or opaque                            liquid), Transverse Colon = 3 (entire mucosa seen                            well with no residual staining, small fragments of                            stool or opaque liquid) and Left Colon = 2 (minor                            amount of residual staining, small fragments of                            stool and/or opaque liquid, but mucosa seen well).                            The total BBPS score equals 8. The quality of the                            bowel preparation was good. The ileocecal valve,                            appendiceal orifice, and rectum were photographed. Scope In: 12:35:00 PM Scope Out: 12:48:31 PM Scope Withdrawal Time: 0 hours 9 minutes 53 seconds  Total Procedure Duration: 0 hours 13 minutes 31 seconds  Findings:      A few medium-mouthed diverticula were found in the ascending colon.      External hemorrhoids were found during perianal exam and during        endoscopy. The hemorrhoids were large.      The source of the hematochezia was localized to the left lateral       external hemorrhoids. It is possible the findings represented a       thrombosed hemorrhoid that bleed. No bleeding was induced with digital       examination of this area. Impression:               - Diverticulosis in the ascending colon.                           - External hemorrhoids.                           - No specimens collected. Moderate Sedation:      Not Applicable - Patient had care per Anesthesia. Recommendation:           - Return patient to hospital ward for ongoing care.                           - Resume regular diet.                           - Continue present medications.                           -  Repeat colonoscopy is not recommended for                            surveillance.                           - If bleeding occurs a ice pack can help to reduce                            the hemorrhoids.                           - If bleeding persists, a Surgical evaluation may                            be required. Procedure Code(s):        --- Professional ---                           [phone], Colonoscopy, flexible; diagnostic, including                            collection of specimen(s) by brushing or washing,                            when performed (separate procedure) Diagnosis Code(s):        --- Professional ---                           K92.1, Melena (includes Hematochezia)                           D62, Acute posthemorrhagic anemia                           K57.30, Diverticulosis of large intestine without                            perforation or abscess without bleeding                           K64.4, Residual hemorrhoidal skin tags CPT copyright 2022 American Medical Association. All rights reserved. The codes documented in this report are preliminary and upon coder review may  be revised to meet current compliance  requirements. Jeani Hawking, MD Jeani Hawking, MD 08/02/2023 12:59:04 PM This report has been signed electronically. Number of Addenda: 0

## 2023-07-22 NOTE — Transfer of Care (Signed)
Immediate Anesthesia Transfer of Care Note  Patient: William Jones [facility]  Procedure(s) Performed: COLONOSCOPY WITH PROPOFOL  Patient Location: PACU and Endoscopy Unit  Anesthesia Type:MAC  Level of Consciousness: awake, alert , oriented, and patient cooperative  Airway & Oxygen Therapy: Patient Spontanous Breathing and Patient connected to face mask oxygen  Post-op Assessment: Report given to RN and Post -op Vital signs reviewed and stable  Post vital signs: Reviewed and stable  Last Vitals:  Vitals Value Taken Time  BP 122/49 07/22/23 1300  Temp 36.1 C 07/22/23 1256  Pulse 80 07/22/23 1300  Resp 22 07/22/23 1300  SpO2 100 % 07/22/23 1300  Vitals shown include unfiled device data.  Last Pain:  Vitals:   07/22/23 1256  TempSrc: Temporal  PainSc:       Patients Stated Pain Goal: 0 (07/18/23 0400)  Complications: No notable events documented.

## 2023-07-22 NOTE — Anesthesia Postprocedure Evaluation (Signed)
Anesthesia Post Note  Patient: William Jones [facility]  Procedure(s) Performed: COLONOSCOPY WITH PROPOFOL     Patient location during evaluation: PACU Anesthesia Type: MAC Level of consciousness: awake and alert Pain management: pain level controlled Vital Signs Assessment: post-procedure vital signs reviewed and stable Respiratory status: spontaneous breathing, nonlabored ventilation and respiratory function stable Cardiovascular status: blood pressure returned to baseline and stable Postop Assessment: no apparent nausea or vomiting Anesthetic complications: no   No notable events documented.  Last Vitals:  Vitals:   07/22/23 1330 07/22/23 1350  BP: (!) 165/77 (!) 150/88  Pulse: 85 86  Resp: 20 17  Temp:  36.9 C  SpO2: 93% 94%    Last Pain:  Vitals:   07/22/23 1350  TempSrc: Oral  PainSc:                  Lowella Curb

## 2023-07-22 NOTE — Plan of Care (Signed)
[facility] Tonsillectomy/Adenoidectomy, Postoperative PEDS care plan entered in error.

## 2023-07-22 NOTE — TOC Progression Note (Signed)
Transition of Care [facility]) - Progression Note    Patient Details  Name: William Jones MRN: [id number] Date of Birth: 30-May-1951  Transition of Care [facility] CM/SW Contact  Otelia Santee, LCSW Phone Number: 07/22/2023, 2:38 PM  Clinical Narrative:    Requested insurance authorization for SNF in case pt is medically ready to transfer to SNF over weekend.  Insurance approved from 10/19 to 10/21.  TOC will continue to follow.    Expected Discharge Plan: Skilled Nursing Facility Barriers to Discharge: Continued Medical Work up  Expected Discharge Plan and Services In-house Referral: NA Discharge Planning Services: NA Post Acute Care Choice: Skilled Nursing Facility Living arrangements for the past 2 months: Apartment                                       Social Determinants of Health (SDOH) Interventions SDOH Screenings   Food Insecurity: No Food Insecurity (06/30/2023)  Housing: Patient Declined (06/30/2023)  Transportation Needs: No Transportation Needs (06/30/2023)  Utilities: Not At Risk (06/30/2023)  Alcohol Screen: Low Risk  (10/20/2022)  Depression (PHQ2-9): Low Risk  (06/24/2023)  Financial Resource Strain: Low Risk  (06/25/2020)  Physical Activity: Sufficiently Active (06/25/2020)  Social Connections: Moderately Integrated (06/25/2020)  Stress: No Stress Concern Present (10/20/2022)  Tobacco Use: Medium Risk (07/22/2023)    Readmission Risk Interventions    07/13/2023   11:19 AM 07/13/2023   11:18 AM 07/04/2023    1:50 PM  Readmission Risk Prevention Plan  Transportation Screening  Complete Complete  PCP or Specialist Appt within 3-5 Days   Complete  HRI or Home Care Consult   Complete  Social Work Consult for Recovery Care Planning/Counseling   Complete  Palliative Care Screening   Complete  Medication Review [employer])  Complete Complete  PCP or Specialist appointment within 3-5 days of discharge  Complete   HRI or Home Care Consult  Complete    SW Recovery Care/Counseling Consult  Complete   Palliative Care Screening Not Applicable Complete   Skilled Nursing Facility  Complete

## 2023-07-22 NOTE — Progress Notes (Signed)
Progress Note   Patient: William Jones:[id number] DOB: 1950-12-01 DOA: 07/11/2023     10 DOS: the patient was seen and examined on 2023/08/16   Brief hospital course: Mr. Hasbrook is a 72 year old M with HTN, HLD, and prostate cancer, recently admitted for generalized weakness, found to have new metastatic neuroendocrine tumor to liver and adrenals.  Was discharged home with plans for outpatient chemo start, but became profoundly weak, returned to the hospital   In the ER, mild transaminitis due to tumor, mild hypokalemia, WBC normal, no fever.  UA with pyuria.    10/6: Started on CTX, admitted for weakness, Xtandi held 10/7: Still weak but stable, Oncology consulted, planned for inpatient chemo 10/8: Patient acutely tachycardic, weak --> Lactate >9 --> transferred to ICU and antibiotics broadened, CCM consulted  Assessment and Plan: Septic shock and lactic acidosis  -Urine culture grew Klebsiella oxytoca.  Blood culture has been negative.   -Patient is on IV Unasyn 3 g every 6 hours.  Patient was previously on vancomycin and meropenem.  Completed antibiotics. -CT angiogram of the chest, CT scan abdomen pelvis 10/8 that showed multifocal pneumonia, possibly, atypical organism; Innumerable liver masses consistent with metastatic disease, multiple adrenal masses and Pancreatic edema.   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   -Oncology had been following.   -Plans noted for ultimate d/c to SNF followed by continuation of chemo   Hypokalemia: -remains low, likely secondary to overnight bowel prep and significant output -will replace   Hypophosphatemia: -Phosphorus is replaced..   Hypomagnesemia: -Magnesium remains low, replaced   Hypocalcemia: This is mostly because of chronic illness.   -Continue on calcium supplementation as long-term therapy.   Anemia: -Patient underwent EGD on 07/20/2023 -Hold subcutaneous  Lovenox. -GI following.  -Hgb had remained in the 9 range, however on 10/15, hgb noted to be 6.6, s/p 1 unit PRBC -Pt now underwent lower endoscopy 08/16/2023. Discussed with GI, findings of external hemorrhoids noted. Per GI, if bleeding occurs, ice pack can help and if bleeding persists, surgical eval may be required   Abnormal liver function test, transaminitis: Shock liver. -stable -Will recheck lft in AM   Essential hypertension: -Cont on metoprolol 50 mg twice daily. -BP stable at this time   Prostate cancer:  -[name] is on hold for now.       Subjective: Pt seen after endoscopy. Eager to have solid food again  Physical Exam: Vitals:   August 16, 2023 1310 2023-08-16 1320 08-16-2023 1330 2023/08/16 1350  BP: (!) 156/67 (!) 160/89 (!) 165/77 (!) 150/88  Pulse: 91 93 85 86  Resp: (!) 27 (!) 23 20 17   Temp:    98.4 F (36.9 C)  TempSrc:    Oral  SpO2: 94% 95% 93% 94%  Weight:      Height:       General exam: Awake, laying in bed, in nad Respiratory system: Normal respiratory effort, no wheezing Cardiovascular system: regular rate, s1, s2 Gastrointestinal system: Soft, nondistended, positive BS Central nervous system: CN2-12 grossly intact, strength intact Extremities: Perfused, no clubbing Skin: Normal skin turgor, no notable skin lesions seen Psychiatry: Mood normal // no visual hallucinations   Data Reviewed:  Labs reviewed: Na 138, K 2.5, Cr 0.42, WBC 9.4, Hgb 8.1, Plts 100   Family Communication: Pt in room, family not at bedside  Disposition: Status is: Inpatient Remains inpatient appropriate because: severity of illness  Planned Discharge Destination: Skilled nursing facility    Author:  Rickey Barbara, MD 07/12/2023 6:19 PM  For on call review [email or web address].

## 2023-07-22 NOTE — Interval H&P Note (Signed)
History and Physical Interval Note:  07/22/2023 12:15 PM  William Jones  has presented today for surgery, with the diagnosis of Hematochezia and anemia.  The various methods of treatment have been discussed with the patient and family. After consideration of risks, benefits and other options for treatment, the patient has consented to  Procedure(s): COLONOSCOPY WITH PROPOFOL (N/A) as a surgical intervention.  The patient's history has been reviewed, patient examined, no change in status, stable for surgery.  I have reviewed the patient's chart and labs.  Questions were answered to the patient's satisfaction.     Saman Giddens D

## 2023-07-22 NOTE — Progress Notes (Signed)
PT Cancellation Note  Patient Details Name: William Jones MRN: [id number] DOB: 1950-10-09   Cancelled Treatment:    Reason Eval/Treat Not Completed: Patient at procedure or test/unavailable Pt noted to be off the floor at this time, will f/u as able.  Aleda Grana, PT, DPT 07/22/23, 11:39 AM   Sandi Mariscal 07/22/2023, 11:39 AM

## 2023-07-22 NOTE — Plan of Care (Signed)
  Problem: Coping: Goal: Ability to adjust to condition or change in health will improve Outcome: Progressing   Problem: Fluid Volume: Goal: Ability to maintain a balanced intake and output will improve Outcome: Progressing   Problem: Health Behavior/Discharge Planning: Goal: Ability to identify and utilize available resources and services will improve Outcome: Progressing   Problem: Metabolic: Goal: Ability to maintain appropriate glucose levels will improve Outcome: Progressing   Problem: Skin Integrity: Goal: Risk for impaired skin integrity will decrease Outcome: Progressing   Problem: Tissue Perfusion: Goal: Adequacy of tissue perfusion will improve Outcome: Progressing   Problem: Clinical Measurements: Goal: Ability to maintain clinical measurements within normal limits will improve Outcome: Progressing   Problem: Pain Managment: Goal: General experience of comfort will improve Outcome: Progressing   Problem: Safety: Goal: Ability to remain free from injury will improve Outcome: Progressing   Problem: Clinical Measurements: Goal: Signs and symptoms of infection will decrease Outcome: Progressing

## 2023-07-22 NOTE — Anesthesia Preprocedure Evaluation (Signed)
Anesthesia Evaluation  Patient identified by MRN, date of birth, ID band Patient awake    Reviewed: Allergy & Precautions, NPO status , Patient's Chart, lab work & pertinent test results, reviewed documented beta blocker date and time   History of Anesthesia Complications Negative for: history of anesthetic complications  Airway Mallampati: II  TM Distance: >3 FB Neck ROM: Limited    Dental no notable dental hx.    Pulmonary neg COPD, former smoker, neg PE   breath sounds clear to auscultation       Cardiovascular hypertension, (-) angina (-) CAD, (-) Past MI, (-) Cardiac Stents and (-) CABG (-) dysrhythmias  Rhythm:Regular Rate:Normal     Neuro/Psych neg Seizures  Neuromuscular disease    GI/Hepatic hiatal hernia,,,(+) neg Cirrhosis        Endo/Other  diabetes    Renal/GU Renal disease     Musculoskeletal   Abdominal   Peds  Hematology  (+) Blood dyscrasia, anemia Lower GIB s/p transfusion of multiple PRBCs   Anesthesia Other Findings   Reproductive/Obstetrics                             Anesthesia Physical Anesthesia Plan  ASA: 4  Anesthesia Plan: MAC   Post-op Pain Management:    Induction: Intravenous  PONV Risk Score and Plan: 1 and Propofol infusion and Treatment may vary due to age or medical condition  Airway Management Planned: Simple Face Mask  Additional Equipment:   Intra-op Plan:   Post-operative Plan:   Informed Consent: I have reviewed the patients History and Physical, chart, labs and discussed the procedure including the risks, benefits and alternatives for the proposed anesthesia with the patient or authorized representative who has indicated his/her understanding and acceptance.     Dental advisory given  Plan Discussed with: CRNA  Anesthesia Plan Comments:         Anesthesia Quick Evaluation

## 2023-07-22 NOTE — Plan of Care (Signed)
  Problem: Nutritional: Goal: Progress toward achieving an optimal weight will improve Outcome: Progressing   Problem: Skin Integrity: Goal: Risk for impaired skin integrity will decrease Outcome: Progressing   Problem: Tissue Perfusion: Goal: Adequacy of tissue perfusion will improve Outcome: Progressing

## 2023-07-23 DIAGNOSIS — N3000 Acute cystitis without hematuria: Secondary | ICD-10-CM | POA: Diagnosis not present

## 2023-07-23 DIAGNOSIS — R579 Shock, unspecified: Secondary | ICD-10-CM | POA: Diagnosis not present

## 2023-07-23 DIAGNOSIS — R531 Weakness: Secondary | ICD-10-CM | POA: Diagnosis not present

## 2023-07-23 DIAGNOSIS — R5383 Other fatigue: Secondary | ICD-10-CM | POA: Diagnosis not present

## 2023-07-23 LAB — COMPREHENSIVE METABOLIC PANEL
ALT: 129 U/L — ABNORMAL HIGH (ref 0–44)
ALT: 121 U/L — ABNORMAL HIGH (ref 0–44)
AST: 51 U/L — ABNORMAL HIGH (ref 15–41)
AST: 47 U/L — ABNORMAL HIGH (ref 15–41)
Albumin: 1.7 g/dL — ABNORMAL LOW (ref 3.5–5.0)
Albumin: 1.8 g/dL — ABNORMAL LOW (ref 3.5–5.0)
Alkaline Phosphatase: 144 U/L — ABNORMAL HIGH (ref 38–126)
Alkaline Phosphatase: 167 U/L — ABNORMAL HIGH (ref 38–126)
Anion gap: 11 (ref 5–15)
Anion gap: 8 (ref 5–15)
BUN: 10 mg/dL (ref 8–23)
BUN: 12 mg/dL (ref 8–23)
CO2: 23 mmol/L (ref 22–32)
CO2: 21 mmol/L — ABNORMAL LOW (ref 22–32)
Calcium: 5.9 mg/dL — CL (ref 8.9–10.3)
Calcium: 6.3 mg/dL — CL (ref 8.9–10.3)
Chloride: 106 mmol/L (ref 98–111)
Chloride: 110 mmol/L (ref 98–111)
Creatinine, Ser: 0.43 mg/dL — ABNORMAL LOW (ref 0.61–1.24)
Creatinine, Ser: 0.54 mg/dL — ABNORMAL LOW (ref 0.61–1.24)
GFR, Estimated: >60 mL/min (ref >60)
GFR, Estimated: >60 mL/min (ref >60)
Glucose, Bld: 158 mg/dL — ABNORMAL HIGH (ref 70–99)
Glucose, Bld: 140 mg/dL — ABNORMAL HIGH (ref 70–99)
Potassium: 2.7 mmol/L — CL (ref 3.5–5.1)
Potassium: 3.6 mmol/L (ref 3.5–5.1)
Sodium: 140 mmol/L (ref 135–145)
Sodium: 139 mmol/L (ref 135–145)
Total Bilirubin: 0.8 mg/dL (ref 0.3–1.2)
Total Bilirubin: 0.6 mg/dL (ref 0.3–1.2)
Total Protein: 4.6 g/dL — ABNORMAL LOW (ref 6.5–8.1)
Total Protein: 4.9 g/dL — ABNORMAL LOW (ref 6.5–8.1)

## 2023-07-23 LAB — GLUCOSE, CAPILLARY
Glucose-Capillary: 135 mg/dL — ABNORMAL HIGH (ref 70–99)
Glucose-Capillary: 174 mg/dL — ABNORMAL HIGH (ref 70–99)
Glucose-Capillary: 206 mg/dL — ABNORMAL HIGH (ref 70–99)
Glucose-Capillary: 188 mg/dL — ABNORMAL HIGH (ref 70–99)
Glucose-Capillary: 154 mg/dL — ABNORMAL HIGH (ref 70–99)

## 2023-07-23 LAB — CBC
HCT: 24.5 % — ABNORMAL LOW (ref 39.0–52.0)
Hemoglobin: 8.3 g/dL — ABNORMAL LOW (ref 13.0–17.0)
MCH: 31 pg (ref 26.0–34.0)
MCHC: 33.9 g/dL (ref 30.0–36.0)
MCV: 91.4 fL (ref 80.0–100.0)
Platelets: 116 10*3/uL — ABNORMAL LOW (ref 150–400)
RBC: 2.68 MIL/uL — ABNORMAL LOW (ref 4.22–5.81)
RDW: 16.6 % — ABNORMAL HIGH (ref 11.5–15.5)
WBC: 9.9 10*3/uL (ref 4.0–10.5)
nRBC: 1.2 % — ABNORMAL HIGH (ref 0.0–0.2)

## 2023-07-23 LAB — PHOSPHORUS: Phosphorus: <1 mg/dL — CL (ref 2.5–4.6)

## 2023-07-23 LAB — MAGNESIUM: Magnesium: 1.7 mg/dL (ref 1.7–2.4)

## 2023-07-23 MED ORDER — CALCIUM GLUCONATE-NACL 1-0.675 GM/50ML-% IV SOLN
1.0000 g | Freq: Once | INTRAVENOUS | Status: AC
  Administered 2023-07-23: 1000 mg via INTRAVENOUS
  Filled 2023-07-23: qty 50

## 2023-07-23 MED ORDER — POTASSIUM CHLORIDE CRYS ER 20 MEQ PO TBCR
60.0000 meq | EXTENDED_RELEASE_TABLET | ORAL | Status: AC
  Administered 2023-07-23 (×2): 60 meq via ORAL
  Filled 2023-07-23 (×2): qty 3

## 2023-07-23 MED ORDER — MAGNESIUM SULFATE 4 GM/100ML IV SOLN
4.0000 g | Freq: Once | INTRAVENOUS | Status: AC
  Administered 2023-07-23: 4 g via INTRAVENOUS
  Filled 2023-07-23: qty 100

## 2023-07-23 NOTE — Plan of Care (Signed)
  Problem: Clinical Measurements: Goal: Will remain free from infection Outcome: Progressing Goal: Diagnostic test results will improve Outcome: Progressing   Problem: Activity: Goal: Risk for activity intolerance will decrease Outcome: Progressing   Problem: Nutritional: Goal: Maintenance of adequate nutrition will improve Outcome: Progressing

## 2023-07-23 NOTE — Progress Notes (Signed)
Progress Note   Patient: William Jones [id number] DOB: 1951-10-03 DOA: July 15, 2023     11 DOS: the patient was seen and examined on 07/23/2023   Brief hospital course: Mr. Marland is a 72 year old M with HTN, HLD, and prostate cancer, recently admitted for generalized weakness, found to have new metastatic neuroendocrine tumor to liver and adrenals.  Was discharged home with plans for outpatient chemo start, but became profoundly weak, returned to the hospital   In the ER, mild transaminitis due to tumor, mild hypokalemia, WBC normal, no fever.  UA with pyuria.    Jul 15, 2023: Started on CTX, admitted for weakness, Xtandi held 10/7: Still weak but stable, Oncology consulted, planned for inpatient chemo 10/8: Patient acutely tachycardic, weak --> Lactate >9 --> transferred to ICU and antibiotics broadened, CCM consulted  Assessment and Plan: Septic shock and lactic acidosis  -Urine culture grew Klebsiella oxytoca.  Blood culture has been negative.   -Patient is on IV Unasyn 3 g every 6 hours.  Patient was previously on vancomycin and meropenem.  Completed antibiotics. -CT angiogram of the chest, CT scan abdomen pelvis 10/8 that showed multifocal pneumonia, possibly, atypical organism; Innumerable liver masses consistent with metastatic disease, multiple adrenal masses and Pancreatic edema.   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   -Oncology had been following.   -Plans noted for ultimate d/c to SNF followed by continuation of chemo   Hypokalemia: -continues to be low -Given replacement -Will recheck bmet this afternoon, continue to aggressively replace as needed   Hypophosphatemia: -Phosphorus is replaced.. -will recheck levels   Hypomagnesemia: -Magnesium remains low this AM -replaced   Hypocalcemia: This is mostly because of chronic illness.   -Continue on calcium supplementation as long-term therapy. -Given IV  replacement -Recheck levels this afternoon   Anemia: -Patient underwent EGD on 07/13/2023 -Hold subcutaneous Lovenox. -GI following.  -Hgb had remained in the 9 range, however on 10/15, hgb noted to be 6.6, s/p 1 unit PRBC -Pt now underwent lower endoscopy 10/18. Discussed with GI, findings of external hemorrhoids noted. Per GI, if bleeding occurs, ice pack can help and if bleeding persists, surgical eval may be required   Abnormal liver function test, transaminitis: Shock liver. -overall stable, slightly improved -Will recheck lft in AM   Essential hypertension: -Cont on metoprolol 50 mg twice daily. -BP stable at this time   Prostate cancer:  -[name] is on hold for now while in hospital      Subjective: Reports tolerating diet well. Eating regular food without reported issues. States his diarrhea is starting to become more formed  Physical Exam: Vitals:   07/30/2023 1350 08/02/2023 2042 07/23/23 0454 07/23/23 1156  BP: (!) 150/88 (!) 151/92 (!) 161/90 127/86  Pulse: 86 (!) 110 89 91  Resp: 17 18 18 20   Temp: 98.4 F (36.9 C) 97.6 F (36.4 C) 98.2 F (36.8 C) 98.2 F (36.8 C)  TempSrc: Oral Oral Oral Oral  SpO2: 94% 98% 91% 91%  Weight:      Height:       General exam: Conversant, in no acute distress Respiratory system: normal chest rise, clear, no audible wheezing Cardiovascular system: regular rhythm, s1-s2 Gastrointestinal system: Nondistended, nontender, pos BS Central nervous system: No seizures, no tremors Extremities: No cyanosis, no joint deformities Skin: No rashes, no pallor Psychiatry: Affect normal // no auditory hallucinations   Data Reviewed:  Labs reviewed: Na 140, K 2.7, Cr 0.43, Ca 5.9, WBC 9.9, Hgb 8.3  Family Communication: Pt in room, family not at bedside  Disposition: Status is: Inpatient Remains inpatient appropriate because: severity of illness  Planned Discharge Destination: Skilled nursing facility    Author: Rickey Barbara,  MD 07/23/2023 4:49 PM  For on call review [email or web address].

## 2023-07-23 NOTE — Progress Notes (Signed)
Patient' potassium this a.m. -2.7 and Calcium - 5.9 reported to Dr. Rhona Leavens

## 2023-07-23 NOTE — Plan of Care (Signed)
  Problem: Coping: Goal: Ability to adjust to condition or change in health will improve Outcome: Progressing   Problem: Skin Integrity: Goal: Risk for impaired skin integrity will decrease Outcome: Progressing   Problem: Education: Goal: Knowledge of General Education information will improve Description: Including pain rating scale, medication(s)/side effects and non-pharmacologic comfort measures Outcome: Progressing   

## 2023-07-24 ENCOUNTER — Inpatient Hospital Stay (HOSPITAL_COMMUNITY): Payer: 59

## 2023-07-24 ENCOUNTER — Other Ambulatory Visit: Payer: Self-pay

## 2023-07-24 DIAGNOSIS — N3000 Acute cystitis without hematuria: Secondary | ICD-10-CM | POA: Diagnosis not present

## 2023-07-24 DIAGNOSIS — R579 Shock, unspecified: Secondary | ICD-10-CM | POA: Diagnosis not present

## 2023-07-24 DIAGNOSIS — R5383 Other fatigue: Secondary | ICD-10-CM | POA: Diagnosis not present

## 2023-07-24 LAB — CBC
HCT: 26.7 % — ABNORMAL LOW (ref 39.0–52.0)
HCT: 26.5 % — ABNORMAL LOW (ref 39.0–52.0)
HCT: 25.9 % — ABNORMAL LOW (ref 39.0–52.0)
Hemoglobin: 8.7 g/dL — ABNORMAL LOW (ref 13.0–17.0)
Hemoglobin: 8.7 g/dL — ABNORMAL LOW (ref 13.0–17.0)
Hemoglobin: 8.5 g/dL — ABNORMAL LOW (ref 13.0–17.0)
MCH: 30.6 pg (ref 26.0–34.0)
MCH: 30.7 pg (ref 26.0–34.0)
MCH: 30.6 pg (ref 26.0–34.0)
MCHC: 32.6 g/dL (ref 30.0–36.0)
MCHC: 32.8 g/dL (ref 30.0–36.0)
MCHC: 32.8 g/dL (ref 30.0–36.0)
MCV: 94 fL (ref 80.0–100.0)
MCV: 93.6 fL (ref 80.0–100.0)
MCV: 93.2 fL (ref 80.0–100.0)
Platelets: 116 10*3/uL — ABNORMAL LOW (ref 150–400)
Platelets: 106 10*3/uL — ABNORMAL LOW (ref 150–400)
Platelets: 117 10*3/uL — ABNORMAL LOW (ref 150–400)
RBC: 2.84 MIL/uL — ABNORMAL LOW (ref 4.22–5.81)
RBC: 2.83 MIL/uL — ABNORMAL LOW (ref 4.22–5.81)
RBC: 2.78 MIL/uL — ABNORMAL LOW (ref 4.22–5.81)
RDW: 17.4 % — ABNORMAL HIGH (ref 11.5–15.5)
RDW: 17.3 % — ABNORMAL HIGH (ref 11.5–15.5)
RDW: 17.3 % — ABNORMAL HIGH (ref 11.5–15.5)
WBC: 9.8 10*3/uL (ref 4.0–10.5)
WBC: 9.3 10*3/uL (ref 4.0–10.5)
WBC: 8.8 10*3/uL (ref 4.0–10.5)
nRBC: 1.2 % — ABNORMAL HIGH (ref 0.0–0.2)
nRBC: 2.1 % — ABNORMAL HIGH (ref 0.0–0.2)
nRBC: 1.9 % — ABNORMAL HIGH (ref 0.0–0.2)

## 2023-07-24 LAB — COMPREHENSIVE METABOLIC PANEL
ALT: 111 U/L — ABNORMAL HIGH (ref 0–44)
AST: 46 U/L — ABNORMAL HIGH (ref 15–41)
Albumin: 1.8 g/dL — ABNORMAL LOW (ref 3.5–5.0)
Alkaline Phosphatase: 177 U/L — ABNORMAL HIGH (ref 38–126)
Anion gap: 10 (ref 5–15)
BUN: 11 mg/dL (ref 8–23)
CO2: 23 mmol/L (ref 22–32)
Calcium: 6.5 mg/dL — ABNORMAL LOW (ref 8.9–10.3)
Chloride: 109 mmol/L (ref 98–111)
Creatinine, Ser: 0.43 mg/dL — ABNORMAL LOW (ref 0.61–1.24)
GFR, Estimated: >60 mL/min (ref >60)
Glucose, Bld: 186 mg/dL — ABNORMAL HIGH (ref 70–99)
Potassium: 4.1 mmol/L (ref 3.5–5.1)
Sodium: 142 mmol/L (ref 135–145)
Total Bilirubin: 0.7 mg/dL (ref 0.3–1.2)
Total Protein: 4.9 g/dL — ABNORMAL LOW (ref 6.5–8.1)

## 2023-07-24 LAB — BASIC METABOLIC PANEL
Anion gap: 9 (ref 5–15)
BUN: 10 mg/dL (ref 8–23)
CO2: 26 mmol/L (ref 22–32)
Calcium: 6.7 mg/dL — ABNORMAL LOW (ref 8.9–10.3)
Chloride: 108 mmol/L (ref 98–111)
Creatinine, Ser: 0.43 mg/dL — ABNORMAL LOW (ref 0.61–1.24)
GFR, Estimated: >60 mL/min (ref >60)
Glucose, Bld: 192 mg/dL — ABNORMAL HIGH (ref 70–99)
Potassium: 3.3 mmol/L — ABNORMAL LOW (ref 3.5–5.1)
Sodium: 143 mmol/L (ref 135–145)

## 2023-07-24 LAB — MAGNESIUM
Magnesium: 2 mg/dL (ref 1.7–2.4)
Magnesium: 1.7 mg/dL (ref 1.7–2.4)

## 2023-07-24 LAB — GLUCOSE, CAPILLARY
Glucose-Capillary: 173 mg/dL — ABNORMAL HIGH (ref 70–99)
Glucose-Capillary: 176 mg/dL — ABNORMAL HIGH (ref 70–99)
Glucose-Capillary: 191 mg/dL — ABNORMAL HIGH (ref 70–99)
Glucose-Capillary: 168 mg/dL — ABNORMAL HIGH (ref 70–99)
Glucose-Capillary: 140 mg/dL — ABNORMAL HIGH (ref 70–99)

## 2023-07-24 LAB — BRAIN NATRIURETIC PEPTIDE: B Natriuretic Peptide: 182.3 pg/mL — ABNORMAL HIGH (ref 0.0–100.0)

## 2023-07-24 LAB — BLOOD GAS, ARTERIAL
Acid-Base Excess: 1.8 mmol/L (ref 0.0–2.0)
Bicarbonate: 24.5 mmol/L (ref 20.0–28.0)
Drawn by: 20012
O2 Saturation: 98.1 %
Patient temperature: 37.7
pCO2 arterial: 31 mm[Hg] — ABNORMAL LOW (ref 32–48)
pH, Arterial: 7.51 — ABNORMAL HIGH (ref 7.35–7.45)
pO2, Arterial: 75 mm[Hg] — ABNORMAL LOW (ref 83–108)

## 2023-07-24 LAB — CREATININE, SERUM
Creatinine, Ser: 0.47 mg/dL — ABNORMAL LOW (ref 0.61–1.24)
GFR, Estimated: >60 mL/min (ref >60)

## 2023-07-24 LAB — LACTIC ACID, PLASMA
Lactic Acid, Venous: 2.3 mmol/L (ref 0.5–1.9)
Lactic Acid, Venous: 2.5 mmol/L (ref 0.5–1.9)

## 2023-07-24 LAB — PHOSPHORUS: Phosphorus: <1 mg/dL — CL (ref 2.5–4.6)

## 2023-07-24 MED ORDER — SODIUM CHLORIDE 0.9 % IV SOLN
2.0000 g | Freq: Every day | INTRAVENOUS | Status: DC
  Administered 2023-07-24 – 2023-07-25 (×2): 2 g via INTRAVENOUS
  Filled 2023-07-24 (×2): qty 20

## 2023-07-24 MED ORDER — FUROSEMIDE 10 MG/ML IJ SOLN
40.0000 mg | Freq: Once | INTRAMUSCULAR | Status: AC
  Administered 2023-07-24: 40 mg via INTRAVENOUS
  Filled 2023-07-24: qty 4

## 2023-07-24 MED ORDER — CHLORHEXIDINE GLUCONATE CLOTH 2 % EX PADS
6.0000 | MEDICATED_PAD | Freq: Every day | CUTANEOUS | Status: DC
Start: 2023-07-24 — End: 2023-07-25
  Administered 2023-07-24 – 2023-07-25 (×2): 6 via TOPICAL

## 2023-07-24 MED ORDER — SODIUM PHOSPHATES 45 MMOLE/15ML IV SOLN
45.0000 mmol | Freq: Once | INTRAVENOUS | Status: AC
  Administered 2023-07-24: 45 mmol via INTRAVENOUS
  Filled 2023-07-24: qty 15

## 2023-07-24 MED ORDER — HYDRALAZINE HCL 20 MG/ML IJ SOLN
10.0000 mg | INTRAMUSCULAR | Status: DC | PRN
Start: 2023-07-24 — End: 2023-08-04
  Administered 2023-07-25 – 2023-07-26 (×2): 10 mg via INTRAVENOUS
  Filled 2023-07-24 (×2): qty 1

## 2023-07-24 MED ORDER — LORAZEPAM 2 MG/ML IJ SOLN
0.5000 mg | Freq: Once | INTRAMUSCULAR | Status: AC
  Administered 2023-07-24: 0.5 mg via INTRAVENOUS
  Filled 2023-07-24: qty 1

## 2023-07-24 MED ORDER — FUROSEMIDE 10 MG/ML IJ SOLN: 40.0000 mg | Freq: Two times a day (BID) | INTRAMUSCULAR | Status: DC

## 2023-07-24 MED ORDER — HYDROMORPHONE HCL 1 MG/ML IJ SOLN
0.5000 mg | Freq: Once | INTRAMUSCULAR | Status: AC
  Administered 2023-07-25: 0.5 mg via INTRAVENOUS
  Filled 2023-07-24: qty 1

## 2023-07-24 MED ORDER — SODIUM PHOSPHATES 45 MMOLE/15ML IV SOLN
45.0000 mmol | Freq: Once | INTRAVENOUS | Status: DC
  Filled 2023-07-24: qty 15

## 2023-07-24 MED ORDER — ALBUMIN HUMAN 25 % IV SOLN
25.0000 g | Freq: Once | INTRAVENOUS | Status: AC
  Administered 2023-07-24: 25 g via INTRAVENOUS
  Filled 2023-07-24: qty 100

## 2023-07-24 MED ORDER — CALCIUM GLUCONATE-NACL 2-0.675 GM/100ML-% IV SOLN
2.0000 g | Freq: Once | INTRAVENOUS | Status: AC
  Administered 2023-07-24: 2000 mg via INTRAVENOUS
  Filled 2023-07-24: qty 100

## 2023-07-24 MED ORDER — POTASSIUM CHLORIDE 10 MEQ/100ML IV SOLN
10.0000 meq | INTRAVENOUS | Status: AC
  Administered 2023-07-24 (×6): 10 meq via INTRAVENOUS
  Filled 2023-07-24 (×6): qty 100

## 2023-07-24 MED ORDER — ENOXAPARIN SODIUM 40 MG/0.4ML IJ SOSY
40.0000 mg | PREFILLED_SYRINGE | INTRAMUSCULAR | Status: DC
Start: 2023-07-24 — End: 2023-07-27
  Administered 2023-07-24 – 2023-07-26 (×3): 40 mg via SUBCUTANEOUS
  Filled 2023-07-24 (×3): qty 0.4

## 2023-07-24 MED ORDER — ALBUMIN HUMAN 25 % IV SOLN
25.0000 g | Freq: Once | INTRAVENOUS | Status: AC
  Administered 2023-07-24: 25 g via INTRAVENOUS
  Filled 2023-07-24 (×2): qty 100

## 2023-07-24 MED ORDER — FUROSEMIDE 10 MG/ML IJ SOLN
60.0000 mg | Freq: Two times a day (BID) | INTRAMUSCULAR | Status: DC
  Administered 2023-07-24 – 2023-08-02 (×18): 60 mg via INTRAVENOUS
  Filled 2023-07-24 (×18): qty 6

## 2023-07-24 MED ORDER — SODIUM CHLORIDE 0.9 % IV SOLN
500.0000 mg | Freq: Every day | INTRAVENOUS | Status: DC
  Administered 2023-07-24 – 2023-07-25 (×2): 500 mg via INTRAVENOUS
  Filled 2023-07-24 (×2): qty 5

## 2023-07-24 MED ORDER — HYDROXYZINE HCL 10 MG PO TABS
10.0000 mg | ORAL_TABLET | Freq: Three times a day (TID) | ORAL | Status: DC | PRN
  Administered 2023-07-24 – 2023-07-29 (×2): 10 mg via ORAL
  Filled 2023-07-24: qty 1

## 2023-07-24 MED ORDER — LABETALOL HCL 5 MG/ML IV SOLN
5.0000 mg | INTRAVENOUS | Status: DC | PRN
  Administered 2023-07-27: 5 mg via INTRAVENOUS
  Filled 2023-07-24: qty 4

## 2023-07-24 MED ORDER — MAGNESIUM SULFATE 2 GM/50ML IV SOLN
2.0000 g | Freq: Once | INTRAVENOUS | Status: AC
  Administered 2023-07-24: 2 g via INTRAVENOUS
  Filled 2023-07-24: qty 50

## 2023-07-24 MED ORDER — SODIUM PHOSPHATES 45 MMOLE/15ML IV SOLN: 30.0000 mmol | Freq: Once | INTRAVENOUS | Status: DC

## 2023-07-24 NOTE — Progress Notes (Addendum)
Progress Note   Patient: William Jones [id number]:[id number] DOB: May 22, 1951 DOA: 07/12/2023     12 DOS: the patient was seen and examined on 07/24/2023   Brief hospital course: William Jones is a 72 year old M with HTN, HLD, and prostate cancer, recently admitted for generalized weakness, found to have new metastatic neuroendocrine tumor to liver and adrenals.  Was discharged home with plans for outpatient chemo start, but became profoundly weak, returned to the hospital   In the ER, mild transaminitis due to tumor, mild hypokalemia, WBC normal, no fever.  UA with pyuria.    10/6: Started on CTX, admitted for weakness, Xtandi held 10/7: Still weak but stable, Oncology consulted, planned for inpatient chemo 10/8: Patient acutely tachycardic, weak --> Lactate >9 --> transferred to ICU and antibiotics broadened, CCM consulted  Assessment and Plan: Septic shock and lactic acidosis  -Urine culture grew Klebsiella oxytoca.  Blood culture has been negative.   -Patient completed course of abx. -CT angiogram of the chest, CT scan abdomen pelvis 10/8 that showed multifocal pneumonia, possibly, atypical organism; Innumerable liver masses consistent with metastatic disease, multiple adrenal masses and Pancreatic edema.   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   -Oncology had been following.   -Plans noted for ultimate d/c to SNF followed by continuation of chemo   Hypokalemia: -remains low, replaced -continue to follow lytes and replace as needed   Hypophosphatemia: -continue to replace -cont to follow levels   Hypomagnesemia: -Magnesium remains low this AM -replaced   Hypocalcemia: This is mostly because of chronic illness.   -improving, continue to replace   Anemia: -Patient underwent EGD on 07/09/2023 -GI had been following.  -Pt now underwent lower endoscopy 2023-08-07 with findings of external hemorrhoids. Per GI, if bleeding  occurs, ice pack can help and if bleeding persists, surgical eval may be required -Hgb stable   Abnormal liver function test, transaminitis: Shock liver. -overall stable, slightly improved -Will recheck lft in AM   Essential hypertension: -Cont on metoprolol 50 mg twice daily. -BP stable at this time   Prostate cancer:  -Xtandi is on hold for now while in hospital  Acute hypoxic respiratory failure -This AM, noted to have markedly worse resp status with increased O2 requrements -CXR ordered and reviewed. Findings suggesting pulm edema vs atypical infection. Empiric azithro and rocephin started -Pt started on BID lasix with albumin, fluids held -pt was transferred to SDU for BiPAP support -follow daily wts and I/o      Subjective: This AM, pt noted to have increased resp effort and hypoxia. When seen, pt himself, did not complain of sob although he was on increased O2 requirements  Physical Exam: Vitals:   07/24/23 1450 07/24/23 1500 07/24/23 1600 07/24/23 1605  BP:  (!) 182/95 (!) 186/100   Pulse: (!) 110 (!) 107 (!) 116 (!) 125  Resp: (!) 35 (!) 35 (!) 27 (!) 44  Temp:      TempSrc:      SpO2: 100% 100% 95% 94%  Weight:      Height:       General exam: Awake, laying in bed, in nad Respiratory system: Normal respiratory effort, no wheezing Cardiovascular system: regular rate, s1, s2 Gastrointestinal system: Soft, nondistended, positive BS Central nervous system: CN2-12 grossly intact, strength intact Extremities: Perfused, no clubbing Skin: Normal skin turgor, no notable skin lesions seen Psychiatry: Mood normal // no visual hallucinations   Data Reviewed:  Labs reviewed: Na 143, K  3.3, Cr 0.43, WBC 8.8, Hgb 8.5, Plts 117   Family Communication: Pt in room, family over phone  Disposition: Status is: Inpatient Remains inpatient appropriate because: severity of illness  Planned Discharge Destination: Skilled nursing facility    critical care time spent  coordinating care, stabilizing patient's respiratory status  Author: Rickey Barbara, MD 07/24/2023 6:06 PM  For on call review [email or web address].

## 2023-07-24 NOTE — Progress Notes (Signed)
Patient O2 saturation is on 80's O2 increased to 6L. Pt. Appears lethargic however alert and answers questions  on closed eyes. Rapid response activated. MD was notified. Pt. On 15L HF sats increased to 92%. Pt. Was transferred to ICU for higher level of care.

## 2023-07-24 NOTE — Plan of Care (Signed)
  Problem: Education: Goal: Knowledge of General Education information will improve Description: Including pain rating scale, medication(s)/side effects and non-pharmacologic comfort measures Outcome: Progressing   Problem: Nutrition: Goal: Adequate nutrition will be maintained Outcome: Progressing   Problem: Coping: Goal: Level of anxiety will decrease Outcome: Progressing   Problem: Elimination: Goal: Will not experience complications related to urinary retention Outcome: Progressing   Problem: Clinical Measurements: Goal: Ability to maintain clinical measurements within normal limits will improve Outcome: Not Progressing Goal: Diagnostic test results will improve Outcome: Not Progressing Goal: Respiratory complications will improve Outcome: Not Progressing   Problem: Activity: Goal: Risk for activity intolerance will decrease Outcome: Not Progressing

## 2023-07-24 NOTE — Progress Notes (Signed)
   07/24/23 0958  Assess: MEWS Score  Temp 98.5 F (36.9 C)  BP 135/87  MAP (mmHg) 103  Pulse Rate 91  Resp (!) 29  SpO2 91 %  Assess: MEWS Score  MEWS Temp 0  MEWS Systolic 0  MEWS Pulse 0  MEWS RR 2  MEWS LOC 0  MEWS Score 2  MEWS Score Color Yellow  Assess: if the MEWS score is Yellow or Red  Were vital signs accurate and taken at a resting state? Yes  Does the patient meet 2 or more of the SIRS criteria? No  Does the patient have a confirmed or suspected source of infection? Yes  MEWS guidelines implemented  Yes, yellow  Treat  MEWS Interventions Considered administering scheduled or prn medications/treatments as ordered  Take Vital Signs  Increase Vital Sign Frequency  Yellow: Q2hr x1, continue Q4hrs until patient remains green for 12hrs  Escalate  MEWS: Escalate Yellow: Discuss with charge nurse and consider notifying provider and/or RRT  Notify: Charge Nurse/RN  Name of Charge Nurse/RN Notified Chief Executive Officer  Provider Notification  Provider Name/Title Rhona Leavens MD  Date Provider Notified 07/24/23  Time Provider Notified 1000  Method of Notification Page  Notification Reason Critical Result  Provider response At bedside;See new orders  Date of Provider Response 07/24/23  Time of Provider Response 1005  Assess: SIRS CRITERIA  SIRS Temperature  0  SIRS Pulse 1  SIRS Respirations  1  SIRS WBC 0  SIRS Score Sum  2   Pt. Appears restless and tachpneic. On re assessment pt. stated "I have a rough night I just can't get comfortable" denies pain, endorses anxiety due to SOB. Resp. called and came to bedside to give pt. breathing treatment.  MD notified, came to bedside, Anti diuretics IV given, chest xray done. Pt. Is closely monitored.

## 2023-07-24 NOTE — Progress Notes (Signed)
Pt. Daughter [name] [phone] was updated of the ICU transfer.

## 2023-07-24 NOTE — Significant Event (Signed)
Rapid Response Event Note   Reason for Call :  Red MEWS; tachypnea & hypoxia  Initial Focused Assessment:  Pt laying in bed satting 77% on 6L [place]. Increased to 15L, O2 improved to 95%. No complaints of shortness of breath, reports feeling sleepy. Per bedside RN, CXR obtained and lasix and albumin given earlier in shift.      Interventions:  O2 increased ABG  Plan of Care:  Transfer to SD BiPAP   Event Summary:   MD Notified: Dr Rhona Leavens Call Time: 1408 Arrival Time: 1412 End Time: 1445  Donnita [place], RN

## 2023-07-24 NOTE — Plan of Care (Signed)
  Problem: Health Behavior/Discharge Planning: Goal: Ability to identify and utilize available resources and services will improve Outcome: Progressing   Problem: Metabolic: Goal: Ability to maintain appropriate glucose levels will improve Outcome: Progressing   Problem: Nutritional: Goal: Maintenance of adequate nutrition will improve Outcome: Progressing   Problem: Skin Integrity: Goal: Risk for impaired skin integrity will decrease Outcome: Progressing   Problem: Tissue Perfusion: Goal: Adequacy of tissue perfusion will improve Outcome: Progressing   Problem: Clinical Measurements: Goal: Ability to maintain clinical measurements within normal limits will improve Outcome: Progressing Goal: Will remain free from infection Outcome: Progressing   Problem: Pain Managment: Goal: General experience of comfort will improve Outcome: Progressing   Problem: Safety: Goal: Ability to remain free from injury will improve Outcome: Progressing

## 2023-07-25 ENCOUNTER — Encounter (HOSPITAL_COMMUNITY): Payer: Self-pay | Admitting: Gastroenterology

## 2023-07-25 DIAGNOSIS — R579 Shock, unspecified: Secondary | ICD-10-CM | POA: Diagnosis not present

## 2023-07-25 DIAGNOSIS — R531 Weakness: Secondary | ICD-10-CM | POA: Diagnosis not present

## 2023-07-25 DIAGNOSIS — R5383 Other fatigue: Secondary | ICD-10-CM | POA: Diagnosis not present

## 2023-07-25 DIAGNOSIS — N3000 Acute cystitis without hematuria: Secondary | ICD-10-CM | POA: Diagnosis not present

## 2023-07-25 LAB — COMPREHENSIVE METABOLIC PANEL
ALT: 80 U/L — ABNORMAL HIGH (ref 0–44)
ALT: 82 U/L — ABNORMAL HIGH (ref 0–44)
AST: 46 U/L — ABNORMAL HIGH (ref 15–41)
AST: 55 U/L — ABNORMAL HIGH (ref 15–41)
Albumin: 2.4 g/dL — ABNORMAL LOW (ref 3.5–5.0)
Albumin: 2.5 g/dL — ABNORMAL LOW (ref 3.5–5.0)
Alkaline Phosphatase: 185 U/L — ABNORMAL HIGH (ref 38–126)
Alkaline Phosphatase: 245 U/L — ABNORMAL HIGH (ref 38–126)
Anion gap: 9 (ref 5–15)
Anion gap: 14 (ref 5–15)
BUN: 8 mg/dL (ref 8–23)
BUN: 8 mg/dL (ref 8–23)
CO2: 27 mmol/L (ref 22–32)
CO2: 27 mmol/L (ref 22–32)
Calcium: 6 mg/dL — CL (ref 8.9–10.3)
Calcium: 7.1 mg/dL — ABNORMAL LOW (ref 8.9–10.3)
Chloride: 105 mmol/L (ref 98–111)
Chloride: 104 mmol/L (ref 98–111)
Creatinine, Ser: 0.37 mg/dL — ABNORMAL LOW (ref 0.61–1.24)
Creatinine, Ser: 0.43 mg/dL — ABNORMAL LOW (ref 0.61–1.24)
GFR, Estimated: >60 mL/min (ref >60)
GFR, Estimated: >60 mL/min (ref >60)
Glucose, Bld: 118 mg/dL — ABNORMAL HIGH (ref 70–99)
Glucose, Bld: 121 mg/dL — ABNORMAL HIGH (ref 70–99)
Potassium: 2.7 mmol/L — CL (ref 3.5–5.1)
Potassium: 3.6 mmol/L (ref 3.5–5.1)
Sodium: 141 mmol/L (ref 135–145)
Sodium: 145 mmol/L (ref 135–145)
Total Bilirubin: 0.6 mg/dL (ref 0.3–1.2)
Total Bilirubin: 0.9 mg/dL (ref 0.3–1.2)
Total Protein: 5.4 g/dL — ABNORMAL LOW (ref 6.5–8.1)
Total Protein: 6.2 g/dL — ABNORMAL LOW (ref 6.5–8.1)

## 2023-07-25 LAB — CBC
HCT: 24.7 % — ABNORMAL LOW (ref 39.0–52.0)
Hemoglobin: 7.9 g/dL — ABNORMAL LOW (ref 13.0–17.0)
MCH: 29.8 pg (ref 26.0–34.0)
MCHC: 32 g/dL (ref 30.0–36.0)
MCV: 93.2 fL (ref 80.0–100.0)
Platelets: 111 10*3/uL — ABNORMAL LOW (ref 150–400)
RBC: 2.65 MIL/uL — ABNORMAL LOW (ref 4.22–5.81)
RDW: 17.2 % — ABNORMAL HIGH (ref 11.5–15.5)
WBC: 8.6 10*3/uL (ref 4.0–10.5)
nRBC: 2.2 % — ABNORMAL HIGH (ref 0.0–0.2)

## 2023-07-25 LAB — CALCIUM, IONIZED: Calcium, Ionized, Serum: 4.1 mg/dL — ABNORMAL LOW (ref 4.5–5.6)

## 2023-07-25 LAB — GLUCOSE, CAPILLARY
Glucose-Capillary: 115 mg/dL — ABNORMAL HIGH (ref 70–99)
Glucose-Capillary: 169 mg/dL — ABNORMAL HIGH (ref 70–99)
Glucose-Capillary: 114 mg/dL — ABNORMAL HIGH (ref 70–99)
Glucose-Capillary: 242 mg/dL — ABNORMAL HIGH (ref 70–99)
Glucose-Capillary: 120 mg/dL — ABNORMAL HIGH (ref 70–99)

## 2023-07-25 LAB — PHOSPHORUS
Phosphorus: 1.7 mg/dL — ABNORMAL LOW (ref 2.5–4.6)
Phosphorus: 2 mg/dL — ABNORMAL LOW (ref 2.5–4.6)

## 2023-07-25 LAB — MAGNESIUM
Magnesium: 1.8 mg/dL (ref 1.7–2.4)
Magnesium: 2 mg/dL (ref 1.7–2.4)

## 2023-07-25 MED ORDER — CHLORHEXIDINE GLUCONATE CLOTH 2 % EX PADS
6.0000 | MEDICATED_PAD | Freq: Every day | CUTANEOUS | Status: DC
  Administered 2023-07-25 – 2023-08-04 (×10): 6 via TOPICAL

## 2023-07-25 MED ORDER — MAGNESIUM SULFATE 2 GM/50ML IV SOLN
2.0000 g | Freq: Once | INTRAVENOUS | Status: AC
Start: 2023-07-25 — End: 2023-07-25
  Administered 2023-07-25: 2 g via INTRAVENOUS
  Filled 2023-07-25: qty 50

## 2023-07-25 MED ORDER — ALBUMIN HUMAN 25 % IV SOLN
25.0000 g | Freq: Once | INTRAVENOUS | Status: AC
  Administered 2023-07-25: 25 g via INTRAVENOUS
  Filled 2023-07-25: qty 100

## 2023-07-25 MED ORDER — POTASSIUM CHLORIDE 10 MEQ/100ML IV SOLN
10.0000 meq | INTRAVENOUS | Status: AC
  Administered 2023-07-25 (×6): 10 meq via INTRAVENOUS
  Filled 2023-07-25 (×6): qty 100

## 2023-07-25 MED ORDER — POTASSIUM PHOSPHATES 15 MMOLE/5ML IV SOLN
30.0000 mmol | Freq: Once | INTRAVENOUS | Status: AC
  Administered 2023-07-25: 30 mmol via INTRAVENOUS
  Filled 2023-07-25: qty 10

## 2023-07-25 MED ORDER — POTASSIUM CHLORIDE CRYS ER 20 MEQ PO TBCR
40.0000 meq | EXTENDED_RELEASE_TABLET | Freq: Two times a day (BID) | ORAL | Status: AC
  Administered 2023-07-25: 40 meq via ORAL
  Filled 2023-07-25: qty 2

## 2023-07-25 MED ORDER — CALCIUM GLUCONATE-NACL 2-0.675 GM/100ML-% IV SOLN
2.0000 g | Freq: Once | INTRAVENOUS | Status: AC
  Administered 2023-07-25: 2000 mg via INTRAVENOUS
  Filled 2023-07-25: qty 100

## 2023-07-25 MED ORDER — SODIUM CHLORIDE 0.9 % IV SOLN
4.0000 g | Freq: Once | INTRAVENOUS | Status: AC
  Administered 2023-07-25: 4 g via INTRAVENOUS
  Filled 2023-07-25: qty 40

## 2023-07-25 NOTE — Progress Notes (Signed)
RT took patient off BIPAP and placed on 12 LHF [place]. Patient doing well at this time.

## 2023-07-25 NOTE — Progress Notes (Signed)
Rt took pt off BIPAP and placed on 11LPM HF. Pt doing well at this time.

## 2023-07-25 NOTE — Progress Notes (Signed)
Progress Note   Patient: William Jones KVQ:[id number] DOB: 08-09-1951 DOA: 07/26/2023     13 DOS: the patient was seen and examined on 07/25/2023   Brief hospital course: William Jones is a 72 year old M with HTN, HLD, and prostate cancer, recently admitted for generalized weakness, found to have new metastatic neuroendocrine tumor to liver and adrenals.  Was discharged home with plans for outpatient chemo start, but became profoundly weak, returned to the hospital   In the ER, mild transaminitis due to tumor, mild hypokalemia, WBC normal, no fever.  UA with pyuria.    10/6: Started on CTX, admitted for weakness, Xtandi held 10/7: Still weak but stable, Oncology consulted, planned for inpatient chemo 10/8: Patient acutely tachycardic, weak --> Lactate >9 --> transferred to ICU and antibiotics broadened, CCM consulted  Assessment and Plan: Septic shock and lactic acidosis  -Urine culture grew Klebsiella oxytoca.  Blood culture has been negative.   -Patient completed course of abx. -CT angiogram of the chest, CT scan abdomen pelvis 10/8 that showed multifocal pneumonia, possibly, atypical organism; Innumerable liver masses consistent with metastatic disease, multiple adrenal masses and Pancreatic edema.   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   -Oncology had been following.   -Plans noted for ultimate d/c to SNF followed by continuation of chemo   Hypokalemia: -remains low, replaced -continue to follow lytes and replace as needed   Hypophosphatemia: -continue to replace -cont to follow levels   Hypomagnesemia: -Magnesium remains low this AM -replaced   Hypocalcemia: This is mostly because of chronic illness.   -improving, continue to replace   Anemia: -Patient underwent EGD on Jul 30, 2023 -GI had been following.  -Pt now underwent lower endoscopy 10/18 with findings of external hemorrhoids. Per GI, if bleeding  occurs, ice pack can help and if bleeding persists, surgical eval may be required -Hgb thus far stable   Abnormal liver function test, transaminitis: Shock liver. -overall stable, slightly improved -Will recheck lft in AM   Essential hypertension: -Cont on metoprolol 50 mg twice daily. -BP stable at this time   Prostate cancer:  -William Jones is on hold for now while in hospital  Acute hypoxic respiratory failure -recently, noted to have markedly worse resp status with increased O2 requrements -CXR ordered and reviewed. Findings suggesting pulm edema vs atypical infection. Empiric azithro and rocephin given -Pt started on BID lasix with albumin, fluids held -pt was transferred to SDU for BiPAP support -no leukocytosis and no fevers. Will hold further abx -Continue lasix 60mg  IV BID      Subjective: This AM, pt denied feeling sob, although pt visibly had increased work of breathing. Denied chest pain  Physical Exam: Vitals:   07/25/23 0953 07/25/23 1000 07/25/23 1100 07/25/23 1200  BP: (!) 141/91 124/70 133/73 127/83  Pulse: (!) 103 (!) 103 77 86  Resp:  17 (!) 26 19  Temp:    97.9 F (36.6 C)  TempSrc:    Oral  SpO2:  (!) 89% 100% 96%  Weight:      Height:       General exam: Conversant, in no acute distress Respiratory system: increased resp effort, clear, no audible wheezing Cardiovascular system: regular rhythm, s1-s2 Gastrointestinal system: Nondistended, nontender, pos BS Central nervous system: No seizures, no tremors Extremities: No cyanosis, no joint deformities, BLE edema Skin: No rashes, no pallor Psychiatry: Affect normal // no auditory hallucinations   Data Reviewed:  Labs reviewed: Na 141, K 2.7, Cr 0.37,  Mg 1.8, Phos 1.7, WBC 8.6, Hgb 7.9   Family Communication: Pt in room, family not at bedside  Disposition: Status is: Inpatient Remains inpatient appropriate because: severity of illness  Planned Discharge Destination: Skilled nursing  facility   Author: Rickey Barbara, MD 07/25/2023 2:49 PM  For on call review [email or web address].

## 2023-07-25 NOTE — Progress Notes (Signed)
   07/25/23 0055  BiPAP/CPAP/SIPAP  BiPAP/CPAP/SIPAP Pt Type Adult  BiPAP/CPAP/SIPAP V60  Mask Type Full face mask  Mask Size Large  Set Rate 10 breaths/min  Respiratory Rate 24 breaths/min  IPAP 15 cmH20  EPAP 5 cmH2O  FiO2 (%) 40 %  Minute Ventilation 20.3  Leak 22  Peak Inspiratory Pressure (PIP) 17  Tidal Volume (Vt) 825  Patient Home Equipment No  Auto Titrate No  Press High Alarm 30 cmH2O  Press Low Alarm 5 cmH2O

## 2023-07-25 NOTE — Plan of Care (Signed)
  Problem: Coping: Goal: Ability to adjust to condition or change in health will improve Outcome: Progressing   Problem: Metabolic: Goal: Ability to maintain appropriate glucose levels will improve Outcome: Progressing   Problem: Nutritional: Goal: Maintenance of adequate nutrition will improve Outcome: Progressing   Problem: Education: Goal: Knowledge of General Education information will improve Description: Including pain rating scale, medication(s)/side effects and non-pharmacologic comfort measures Outcome: Progressing   Problem: Clinical Measurements: Goal: Ability to maintain clinical measurements within normal limits will improve Outcome: Progressing Goal: Will remain free from infection Outcome: Progressing Goal: Diagnostic test results will improve Outcome: Progressing Goal: Respiratory complications will improve Outcome: Progressing Goal: Cardiovascular complication will be avoided Outcome: Progressing

## 2023-07-25 NOTE — TOC Progression Note (Signed)
Transition of Care [facility]) - Progression Note    Patient Details  Name: William Jones MRN: [id number] Date of Birth: 01-26-1951  [facility] CM/SW Contact  Lavenia Atlas, RN Phone Number: 07/25/2023, 2:15 PM  Clinical Narrative:   Per chart review patient currently in [facility] SDU for shock. Patient currently requiring 11LPM HFNC. Per chart review short term SNF auth 10/19-1021, patient not medically ready for discharge.  TOC following for needs.    Expected Discharge Plan: Skilled Nursing Facility Barriers to Discharge: Continued Medical Work up  Expected Discharge Plan and Services In-house Referral: NA Discharge Planning Services: CM Consult Post Acute Care Choice: Skilled Nursing Facility Living arrangements for the past 2 months: Apartment                 DME Arranged: N/A DME Agency: NA       HH Arranged: NA HH Agency: NA         Social Determinants of Health (SDOH) Interventions SDOH Screenings   Food Insecurity: No Food Insecurity (06/30/2023)  Housing: Patient Declined (06/30/2023)  Transportation Needs: No Transportation Needs (06/30/2023)  Utilities: Not At Risk (06/30/2023)  Alcohol Screen: Low Risk  (10/20/2022)  Depression (PHQ2-9): Low Risk  (06/24/2023)  Financial Resource Strain: Low Risk  (06/25/2020)  Physical Activity: Sufficiently Active (06/25/2020)  Social Connections: Moderately Integrated (06/25/2020)  Stress: No Stress Concern Present (10/20/2022)  Tobacco Use: Medium Risk (07/22/2023)    Readmission Risk Interventions    07/13/2023   11:19 AM 07/13/2023   11:18 AM 07/04/2023    1:50 PM  Readmission Risk Prevention Plan  Transportation Screening  Complete Complete  PCP or Specialist Appt within 3-5 Days   Complete  HRI or Home Care Consult   Complete  Social Work Consult for Recovery Care Planning/Counseling   Complete  Palliative Care Screening   Complete  Medication Review Oceanographer)  Complete Complete  PCP or  Specialist appointment within 3-5 days of discharge  Complete   HRI or Home Care Consult  Complete   SW Recovery Care/Counseling Consult  Complete   Palliative Care Screening Not Applicable Complete   Skilled Nursing Facility  Complete

## 2023-07-25 NOTE — Progress Notes (Signed)
Rt placed pt back on BIPAP due to increase WOB and low sat.

## 2023-07-26 DIAGNOSIS — R5383 Other fatigue: Secondary | ICD-10-CM | POA: Diagnosis not present

## 2023-07-26 DIAGNOSIS — R579 Shock, unspecified: Secondary | ICD-10-CM | POA: Diagnosis not present

## 2023-07-26 DIAGNOSIS — R531 Weakness: Secondary | ICD-10-CM | POA: Diagnosis not present

## 2023-07-26 DIAGNOSIS — N3000 Acute cystitis without hematuria: Secondary | ICD-10-CM | POA: Diagnosis not present

## 2023-07-26 LAB — COMPREHENSIVE METABOLIC PANEL
ALT: 64 U/L — ABNORMAL HIGH (ref 0–44)
AST: 39 U/L (ref 15–41)
Albumin: 2.1 g/dL — ABNORMAL LOW (ref 3.5–5.0)
Alkaline Phosphatase: 263 U/L — ABNORMAL HIGH (ref 38–126)
Anion gap: 13 (ref 5–15)
BUN: 11 mg/dL (ref 8–23)
CO2: 27 mmol/L (ref 22–32)
Calcium: 6.8 mg/dL — ABNORMAL LOW (ref 8.9–10.3)
Chloride: 100 mmol/L (ref 98–111)
Creatinine, Ser: 0.45 mg/dL — ABNORMAL LOW (ref 0.61–1.24)
GFR, Estimated: >60 mL/min (ref >60)
Glucose, Bld: 259 mg/dL — ABNORMAL HIGH (ref 70–99)
Potassium: 3 mmol/L — ABNORMAL LOW (ref 3.5–5.1)
Sodium: 140 mmol/L (ref 135–145)
Total Bilirubin: 0.9 mg/dL (ref 0.3–1.2)
Total Protein: 5.1 g/dL — ABNORMAL LOW (ref 6.5–8.1)

## 2023-07-26 LAB — BASIC METABOLIC PANEL
Anion gap: 12 (ref 5–15)
BUN: 12 mg/dL (ref 8–23)
CO2: 30 mmol/L (ref 22–32)
Calcium: 7.2 mg/dL — ABNORMAL LOW (ref 8.9–10.3)
Chloride: 100 mmol/L (ref 98–111)
Creatinine, Ser: 0.51 mg/dL — ABNORMAL LOW (ref 0.61–1.24)
GFR, Estimated: >60 mL/min (ref >60)
Glucose, Bld: 223 mg/dL — ABNORMAL HIGH (ref 70–99)
Potassium: 3.9 mmol/L (ref 3.5–5.1)
Sodium: 142 mmol/L (ref 135–145)

## 2023-07-26 LAB — GLUCOSE, CAPILLARY
Glucose-Capillary: 236 mg/dL — ABNORMAL HIGH (ref 70–99)
Glucose-Capillary: 189 mg/dL — ABNORMAL HIGH (ref 70–99)
Glucose-Capillary: 215 mg/dL — ABNORMAL HIGH (ref 70–99)
Glucose-Capillary: 188 mg/dL — ABNORMAL HIGH (ref 70–99)
Glucose-Capillary: 122 mg/dL — ABNORMAL HIGH (ref 70–99)

## 2023-07-26 LAB — MAGNESIUM
Magnesium: 1.6 mg/dL — ABNORMAL LOW (ref 1.7–2.4)
Magnesium: 2.1 mg/dL (ref 1.7–2.4)

## 2023-07-26 LAB — PHOSPHORUS: Phosphorus: 1.7 mg/dL — ABNORMAL LOW (ref 2.5–4.6)

## 2023-07-26 MED ORDER — CALCIUM GLUCONATE-NACL 1-0.675 GM/50ML-% IV SOLN
1.0000 g | Freq: Once | INTRAVENOUS | Status: AC
  Administered 2023-07-26: 1000 mg via INTRAVENOUS
  Filled 2023-07-26: qty 50

## 2023-07-26 MED ORDER — POTASSIUM PHOSPHATES 15 MMOLE/5ML IV SOLN
45.0000 mmol | Freq: Once | INTRAVENOUS | Status: AC
  Administered 2023-07-26: 45 mmol via INTRAVENOUS
  Filled 2023-07-26: qty 15

## 2023-07-26 MED ORDER — ORAL CARE MOUTH RINSE
15.0000 mL | OROMUCOSAL | Status: DC | PRN
  Administered 2023-07-28: 15 mL via OROMUCOSAL

## 2023-07-26 MED ORDER — METOPROLOL TARTRATE 25 MG PO TABS
75.0000 mg | ORAL_TABLET | Freq: Two times a day (BID) | ORAL | Status: DC
  Administered 2023-07-26 (×2): 75 mg via ORAL
  Filled 2023-07-26 (×3): qty 3

## 2023-07-26 MED ORDER — CALCIUM CARBONATE 1250 (500 CA) MG PO TABS
1.0000 | ORAL_TABLET | Freq: Two times a day (BID) | ORAL | Status: DC
  Administered 2023-07-26 – 2023-07-28 (×4): 1250 mg via ORAL
  Filled 2023-07-26 (×5): qty 1

## 2023-07-26 MED ORDER — ORAL CARE MOUTH RINSE: 15.0000 mL | OROMUCOSAL | Status: DC | PRN

## 2023-07-26 MED ORDER — POTASSIUM CHLORIDE 10 MEQ/100ML IV SOLN
10.0000 meq | INTRAVENOUS | Status: AC
  Administered 2023-07-26 (×4): 10 meq via INTRAVENOUS
  Filled 2023-07-26 (×4): qty 100

## 2023-07-26 MED ORDER — POTASSIUM CHLORIDE CRYS ER 20 MEQ PO TBCR
40.0000 meq | EXTENDED_RELEASE_TABLET | Freq: Once | ORAL | Status: AC
  Administered 2023-07-26: 40 meq via ORAL
  Filled 2023-07-26: qty 2

## 2023-07-26 MED ORDER — MAGNESIUM SULFATE 4 GM/100ML IV SOLN
4.0000 g | Freq: Once | INTRAVENOUS | Status: AC
  Administered 2023-07-26: 4 g via INTRAVENOUS
  Filled 2023-07-26: qty 100

## 2023-07-26 MED ORDER — MAGNESIUM OXIDE -MG SUPPLEMENT 400 (240 MG) MG PO TABS
400.0000 mg | ORAL_TABLET | Freq: Two times a day (BID) | ORAL | Status: DC
  Administered 2023-07-26 – 2023-07-28 (×4): 400 mg via ORAL
  Filled 2023-07-26 (×5): qty 1

## 2023-07-26 MED ORDER — CALCIUM GLUCONATE-NACL 2-0.675 GM/100ML-% IV SOLN
2.0000 g | Freq: Once | INTRAVENOUS | Status: DC
  Filled 2023-07-26: qty 100

## 2023-07-26 MED ORDER — ALBUMIN HUMAN 25 % IV SOLN
25.0000 g | Freq: Once | INTRAVENOUS | Status: AC
  Administered 2023-07-26: 25 g via INTRAVENOUS
  Filled 2023-07-26: qty 100

## 2023-07-26 MED ORDER — K PHOS MONO-SOD PHOS DI & MONO 155-852-130 MG PO TABS
500.0000 mg | ORAL_TABLET | Freq: Two times a day (BID) | ORAL | Status: DC
  Administered 2023-07-26 – 2023-07-28 (×3): 500 mg via ORAL
  Filled 2023-07-26 (×4): qty 2

## 2023-07-26 MED ORDER — CALCIUM GLUCONATE-NACL 2-0.675 GM/100ML-% IV SOLN
2.0000 g | Freq: Once | INTRAVENOUS | Status: AC
  Administered 2023-07-26: 2000 mg via INTRAVENOUS
  Filled 2023-07-26: qty 100

## 2023-07-26 NOTE — Plan of Care (Signed)
  Problem: Fluid Volume: Goal: Ability to maintain a balanced intake and output will improve Outcome: Progressing   Problem: Nutritional: Goal: Maintenance of adequate nutrition will improve Outcome: Progressing   Problem: Coping: Goal: Level of anxiety will decrease Outcome: Progressing   Problem: Pain Managment: Goal: General experience of comfort will improve Outcome: Progressing

## 2023-07-26 NOTE — Progress Notes (Signed)
Pharmacy Electrolyte Replacement  Recent Labs:  Recent Labs    07/26/23 0319 07/26/23 1741  K 3.0* 3.9  MG 1.6* 2.1  PHOS 1.7*  --   CREATININE 0.45* 0.51*   Patient diuresing extensively on BID IV Lasix since 10/20 for fluid overload and increased WOB. with great difficulty keeping up with electrolytes. Asked by Dr. Rhona Leavens 10/22 for pharmacy to help with monitoring and electrolyte supplementation. - We are also contributing to fluid overload with all the electrolyte supplementation bags of fluid.  UOP 10/20: 4225> UOP 10/21: 2550 UOP:10/22: already up to before noon.  Plan: - Regular diet so will try to utilize some oral supplementation - CoCa: 8.7 >> CaGluconate 1g IV x 1  - BMET, Mg, Phos daily - When IV Lasix stops, we will have to be vigilant to decrease/stop all these supplements. - Will need to check with new hospitalist on service 10/23 for continuation of protocol  Bernadene Person, PharmD, BCPS [phone] 07/26/2023, 9:28 PM

## 2023-07-26 NOTE — Progress Notes (Signed)
Progress Note   Patient: William Jones NFA:[id number] DOB: 02/17/1951 DOA: 07/20/2023     14 DOS: the patient was seen and examined on 07/26/2023   Brief hospital course: William Jones is a 72 year old M with HTN, HLD, and prostate cancer, recently admitted for generalized weakness, found to have new metastatic neuroendocrine tumor to liver and adrenals.  Was discharged home with plans for outpatient chemo start, but became profoundly weak, returned to the hospital   In the ER, mild transaminitis due to tumor, mild hypokalemia, WBC normal, no fever.  UA with pyuria.    10/6: Started on CTX, admitted for weakness, Xtandi held 10/7: Still weak but stable, Oncology consulted, planned for inpatient chemo 10/8: Patient acutely tachycardic, weak --> Lactate >9 --> transferred to ICU and antibiotics broadened, CCM consulted  Assessment and Plan: Septic shock and lactic acidosis  -Urine culture grew Klebsiella oxytoca.  Blood culture has been negative.   -Patient completed course of abx. -CT angiogram of the chest, CT scan abdomen pelvis 10/8 that showed multifocal pneumonia, possibly, atypical organism; Innumerable liver masses consistent with metastatic disease, multiple adrenal masses and Pancreatic edema.   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   -Oncology had been following.   -Eventual d/c to SNF followed by continuation of chemo   Hypokalemia: -remains low, replaced, possibly made worse with aggressive diuresis -given complexity of electrolyte replacement, have consulted Pharmacy to assist with correcting lytes   Hypophosphatemia: -continue to replace -consulted Pharmacy to help correct   Hypomagnesemia: -Magnesium remains low this AM -continue to replace   Hypocalcemia: This is mostly because of chronic illness.   -improving, continue to replace   Anemia: -Patient underwent EGD on 07/17/2023 -GI had been following.   -Pt now underwent lower endoscopy 07-25-23 with findings of external hemorrhoids. Per GI, if bleeding occurs, ice pack can help and if bleeding persists, surgical eval may be required -Hgb down to 7.9, no evidence of acute blood loss. Suspect possible dilutional given pt being volume overloaded -recheck cbc in AM   Abnormal liver function test, transaminitis: Shock liver. -overall stable, slightly improved -Will recheck lft in AM   Essential hypertension: -Cont on metoprolol 50 mg twice daily. -BP stable at this time   Prostate cancer:  -William Jones is on hold for now while in hospital  Acute hypoxic respiratory failure -recently, noted to have markedly worse resp status with increased O2 requrements -CXR findings  suggesting pulm edema vs atypical infection. Empiric azithro and rocephin given. Pt is afebrile and without leukocytosis, will hold further abx after today -Continue lasix 60mg  IV BID, voiding very well -Wean O2 as tolerated      Subjective: This AM, pt reports breathing better  Physical Exam: Vitals:   07/26/23 1200 07/26/23 1222 07/26/23 1651 07/26/23 1705  BP: 114/62     Pulse: 84 77    Resp: (!) 25 (!) 27    Temp: 98.2 F (36.8 C) 98.4 F (36.9 C) 98.5 F (36.9 C)   TempSrc: Oral Axillary Oral   SpO2: 98% 95%  98%  Weight:      Height:       General exam: Awake, laying in bed, in nad Respiratory system: Normal respiratory effort, no wheezing Cardiovascular system: regular rate, s1, s2 Gastrointestinal system: Soft, nondistended, positive BS Central nervous system: CN2-12 grossly intact, strength intact Extremities: Perfused, no clubbing Skin: Normal skin turgor, no notable skin lesions seen Psychiatry: Mood normal // no visual hallucinations  Data Reviewed:  Labs reviewed: Na 140, K 3.0, Cr 0.45, Phos 1.7, Mg 1.6   Family Communication: Pt in room, family currently at bedside  Disposition: Status is: Inpatient Remains inpatient appropriate because:  severity of illness  Planned Discharge Destination: Skilled nursing facility   Author: Rickey Barbara, MD 07/26/2023 5:28 PM  For on call review [email or web address].

## 2023-07-26 NOTE — Progress Notes (Signed)
   07/26/23 2049  BiPAP/CPAP/SIPAP  BiPAP/CPAP/SIPAP Pt Type Adult  BiPAP/CPAP/SIPAP V60   Pt. Is currently off BiPAP which remains at bedside, remains on HFNC([place])device and tolerating well.

## 2023-07-26 NOTE — Plan of Care (Signed)
  Problem: Education: Goal: Ability to describe self-care measures that may prevent or decrease complications (Diabetes Survival Skills Education) will improve Outcome: Progressing Goal: Individualized Educational Video(s) Outcome: Progressing   Problem: Coping: Goal: Ability to adjust to condition or change in health will improve Outcome: Progressing   Problem: Fluid Volume: Goal: Ability to maintain a balanced intake and output will improve Outcome: Progressing   Problem: Health Behavior/Discharge Planning: Goal: Ability to identify and utilize available resources and services will improve Outcome: Progressing Goal: Ability to manage health-related needs will improve Outcome: Progressing   Problem: Metabolic: Goal: Ability to maintain appropriate glucose levels will improve Outcome: Progressing   Problem: Nutritional: Goal: Maintenance of adequate nutrition will improve Outcome: Progressing Goal: Progress toward achieving an optimal weight will improve Outcome: Progressing   Problem: Skin Integrity: Goal: Risk for impaired skin integrity will decrease Outcome: Progressing   Problem: Tissue Perfusion: Goal: Adequacy of tissue perfusion will improve Outcome: Progressing   Problem: Education: Goal: Knowledge of General Education information will improve Description: Including pain rating scale, medication(s)/side effects and non-pharmacologic comfort measures Outcome: Progressing   Problem: Health Behavior/Discharge Planning: Goal: Ability to manage health-related needs will improve Outcome: Progressing   Problem: Clinical Measurements: Goal: Ability to maintain clinical measurements within normal limits will improve Outcome: Progressing Goal: Will remain free from infection Outcome: Progressing Goal: Diagnostic test results will improve Outcome: Progressing Goal: Respiratory complications will improve Outcome: Progressing Goal: Cardiovascular complication will  be avoided Outcome: Progressing   Problem: Activity: Goal: Risk for activity intolerance will decrease Outcome: Progressing   Problem: Nutrition: Goal: Adequate nutrition will be maintained Outcome: Progressing   Problem: Coping: Goal: Level of anxiety will decrease Outcome: Progressing   Problem: Elimination: Goal: Will not experience complications related to bowel motility Outcome: Progressing Goal: Will not experience complications related to urinary retention Outcome: Progressing   Problem: Pain Managment: Goal: General experience of comfort will improve Outcome: Progressing   Problem: Safety: Goal: Ability to remain free from injury will improve Outcome: Progressing   Problem: Skin Integrity: Goal: Risk for impaired skin integrity will decrease Outcome: Progressing   Problem: Fluid Volume: Goal: Hemodynamic stability will improve Outcome: Progressing   Problem: Clinical Measurements: Goal: Diagnostic test results will improve Outcome: Progressing Goal: Signs and symptoms of infection will decrease Outcome: Progressing   Problem: Respiratory: Goal: Ability to maintain adequate ventilation will improve Outcome: Progressing   Problem: Education: Goal: Knowledge of the prescribed therapeutic regimen will improve Outcome: Progressing   Problem: Activity: Goal: Ability to implement measures to reduce episodes of fatigue will improve Outcome: Progressing   Problem: Bowel/Gastric: Goal: Will not experience complications related to bowel motility Outcome: Progressing   Problem: Coping: Goal: Ability to identify and develop effective coping behavior will improve Outcome: Progressing   Problem: Nutritional: Goal: Maintenance of adequate nutrition will improve Outcome: Progressing

## 2023-07-26 NOTE — Progress Notes (Signed)
PT Cancellation Note  Patient Details Name: William Jones MRN: [id number] DOB: [birth year]   Cancelled Treatment:    Reason Eval/Treat Not Completed: Medical issues which prohibited therapy,requiring 13 L HFNC. Will check  back when  requiring less support. Blanchard Kelch PT Acute Rehabilitation Services Office [phone] Weekend [phone]   [name] 07/26/2023, 8:04 AM

## 2023-07-26 NOTE — TOC Progression Note (Addendum)
Transition of Care [facility]) - Progression Note    Patient Details  Name: William Jones MRN: [id number] Date of Birth: 12/11/1950  [facility]) CM/SW Contact  Darleene Cleaver, [place] Phone Number: 07/26/2023, 11:19 AM  Clinical Narrative:     CSW sent message to attending physician to see if he thinks LTACH may be a good option.  Per MD he agreed, and CSW sent referral to [facility] from [facility] to review.  Per [facility] patient does meet criteria.  CSW asked physician to speak to the family about LTACH.  TOC to continue to follow patient's progress throughout discharge planning.   Expected Discharge Plan: Skilled Nursing Facility Barriers to Discharge: Continued Medical Work up  Expected Discharge Plan and Services In-house Referral: NA Discharge Planning Services: CM Consult Post Acute Care Choice: Skilled Nursing Facility Living arrangements for the past 2 months: Apartment                 DME Arranged: N/A DME Agency: NA       HH Arranged: NA HH Agency: NA         Social Determinants of Health (SDOH) Interventions SDOH Screenings   Food Insecurity: No Food Insecurity (06/30/2023)  Housing: Patient Declined (06/30/2023)  Transportation Needs: No Transportation Needs (06/30/2023)  Utilities: Not At Risk (06/30/2023)  Alcohol Screen: Low Risk  (10/20/2022)  Depression (PHQ2-9): Low Risk  (06/24/2023)  Financial Resource Strain: Low Risk  (06/25/2020)  Physical Activity: Sufficiently Active (06/25/2020)  Social Connections: Moderately Integrated (06/25/2020)  Stress: No Stress Concern Present (10/20/2022)  Tobacco Use: Medium Risk (07/22/2023)    Readmission Risk Interventions    07/13/2023   11:19 AM 07/13/2023   11:18 AM 07/04/2023    1:50 PM  Readmission Risk Prevention Plan  Transportation Screening  Complete Complete  PCP or Specialist Appt within 3-5 Days   Complete  HRI or Home Care Consult   Complete  Social Work Consult for Recovery Care  Planning/Counseling   Complete  Palliative Care Screening   Complete  Medication Review Oceanographer)  Complete Complete  PCP or Specialist appointment within 3-5 days of discharge  Complete   HRI or Home Care Consult  Complete   SW Recovery Care/Counseling Consult  Complete   Palliative Care Screening Not Applicable Complete   Skilled Nursing Facility  Complete

## 2023-07-26 NOTE — Progress Notes (Addendum)
Pharmacy Electrolyte Replacement  Recent Labs:  Recent Labs    07/26/23 0319  K 3.0*  MG 1.6*  PHOS 1.7*  CREATININE 0.45*   Patient diuresing extensively on BID IV Lasix since 10/20 for fluid overload and increased WOB. with great difficulty keeping up with electrolytes. Asked by Dr. Rhona Leavens 10/22 for pharmacy to help with monitoring and electrolyte supplementation. - We are also contributing to fluid overload with all the electrolyte supplementation bags of fluid.  UOP 10/20: 4225> UOP 10/21: 2550 UOP:10/22: already up to before noon.  Plan: - Regular diet so will try to utilize some oral supplementation - K:  IV x 4, Kdur 40 po x 1  - CoCa  8.32: CaGluconate 2g IV x 1, Change Tums (200mg ) to Oscal (500mg  elemental Ca) po BID starting tonight  - Phos: Kphos IV x 1 (over 6 hrs), then start Kphos Neutral 500mg  po BID tonight  - Mg 1.6: Mg 4g IV x 1, start Magox 400mg  BID tonight  - Recheck K+ and Mg this evening and supplement if needed - BMET, Mg, Phos daily - When IV Lasix stops, we will have to be vigilant to decrease/stop all these supplements. - Will need to check with new hospitalist on service 10/23 for continuation of protocol  Enid Maultsby S. Merilynn Finland, PharmD, BCPS Clinical Staff Pharmacist [email or web address]

## 2023-07-27 ENCOUNTER — Inpatient Hospital Stay: Payer: 59

## 2023-07-27 ENCOUNTER — Inpatient Hospital Stay (HOSPITAL_COMMUNITY): Payer: 59

## 2023-07-27 ENCOUNTER — Inpatient Hospital Stay: Payer: 59 | Admitting: Hematology and Oncology

## 2023-07-27 DIAGNOSIS — Z515 Encounter for palliative care: Secondary | ICD-10-CM

## 2023-07-27 DIAGNOSIS — C801 Malignant (primary) neoplasm, unspecified: Secondary | ICD-10-CM | POA: Diagnosis not present

## 2023-07-27 DIAGNOSIS — Z7189 Other specified counseling: Secondary | ICD-10-CM

## 2023-07-27 DIAGNOSIS — J9601 Acute respiratory failure with hypoxia: Secondary | ICD-10-CM | POA: Diagnosis present

## 2023-07-27 DIAGNOSIS — R531 Weakness: Secondary | ICD-10-CM | POA: Diagnosis not present

## 2023-07-27 DIAGNOSIS — J810 Acute pulmonary edema: Secondary | ICD-10-CM

## 2023-07-27 DIAGNOSIS — N3000 Acute cystitis without hematuria: Secondary | ICD-10-CM | POA: Diagnosis not present

## 2023-07-27 DIAGNOSIS — R579 Shock, unspecified: Secondary | ICD-10-CM | POA: Diagnosis not present

## 2023-07-27 DIAGNOSIS — R5383 Other fatigue: Secondary | ICD-10-CM | POA: Diagnosis not present

## 2023-07-27 LAB — BASIC METABOLIC PANEL
Anion gap: 11 (ref 5–15)
Anion gap: 12 (ref 5–15)
BUN: 11 mg/dL (ref 8–23)
BUN: 13 mg/dL (ref 8–23)
CO2: 31 mmol/L (ref 22–32)
CO2: 29 mmol/L (ref 22–32)
Calcium: 6.9 mg/dL — ABNORMAL LOW (ref 8.9–10.3)
Calcium: 6.9 mg/dL — ABNORMAL LOW (ref 8.9–10.3)
Chloride: 100 mmol/L (ref 98–111)
Chloride: 102 mmol/L (ref 98–111)
Creatinine, Ser: 0.41 mg/dL — ABNORMAL LOW (ref 0.61–1.24)
Creatinine, Ser: 0.45 mg/dL — ABNORMAL LOW (ref 0.61–1.24)
GFR, Estimated: >60 mL/min (ref >60)
GFR, Estimated: >60 mL/min (ref >60)
Glucose, Bld: 135 mg/dL — ABNORMAL HIGH (ref 70–99)
Glucose, Bld: 138 mg/dL — ABNORMAL HIGH (ref 70–99)
Potassium: 3 mmol/L — ABNORMAL LOW (ref 3.5–5.1)
Potassium: 3.2 mmol/L — ABNORMAL LOW (ref 3.5–5.1)
Sodium: 142 mmol/L (ref 135–145)
Sodium: 143 mmol/L (ref 135–145)

## 2023-07-27 LAB — GLUCOSE, CAPILLARY
Glucose-Capillary: 130 mg/dL — ABNORMAL HIGH (ref 70–99)
Glucose-Capillary: 140 mg/dL — ABNORMAL HIGH (ref 70–99)
Glucose-Capillary: 202 mg/dL — ABNORMAL HIGH (ref 70–99)
Glucose-Capillary: 151 mg/dL — ABNORMAL HIGH (ref 70–99)
Glucose-Capillary: 80 mg/dL (ref 70–99)

## 2023-07-27 LAB — CBC
HCT: 25.6 % — ABNORMAL LOW (ref 39.0–52.0)
Hemoglobin: 8.4 g/dL — ABNORMAL LOW (ref 13.0–17.0)
MCH: 30.4 pg (ref 26.0–34.0)
MCHC: 32.8 g/dL (ref 30.0–36.0)
MCV: 92.8 fL (ref 80.0–100.0)
Platelets: 35 10*3/uL — ABNORMAL LOW (ref 150–400)
RBC: 2.76 MIL/uL — ABNORMAL LOW (ref 4.22–5.81)
RDW: 16.9 % — ABNORMAL HIGH (ref 11.5–15.5)
WBC: 7.6 10*3/uL (ref 4.0–10.5)
nRBC: 1.2 % — ABNORMAL HIGH (ref 0.0–0.2)

## 2023-07-27 LAB — D-DIMER, QUANTITATIVE: D-Dimer, Quant: >20 ug{FEU}/mL — ABNORMAL HIGH (ref 0.00–0.50)

## 2023-07-27 LAB — MAGNESIUM: Magnesium: 2 mg/dL (ref 1.7–2.4)

## 2023-07-27 LAB — PHOSPHORUS: Phosphorus: 2.1 mg/dL — ABNORMAL LOW (ref 2.5–4.6)

## 2023-07-27 LAB — PROCALCITONIN: Procalcitonin: 16.12 ng/mL

## 2023-07-27 MED ORDER — SODIUM CHLORIDE 0.9 % IV SOLN
2.0000 g | Freq: Three times a day (TID) | INTRAVENOUS | Status: DC
  Administered 2023-07-27: 2 g via INTRAVENOUS
  Filled 2023-07-27: qty 12.5

## 2023-07-27 MED ORDER — POTASSIUM CHLORIDE 10 MEQ/100ML IV SOLN
10.0000 meq | INTRAVENOUS | Status: DC
  Administered 2023-07-27: 10 meq via INTRAVENOUS
  Filled 2023-07-27 (×2): qty 100

## 2023-07-27 MED ORDER — HYDROMORPHONE HCL 1 MG/ML IJ SOLN
1.0000 mg | INTRAMUSCULAR | Status: DC | PRN
  Administered 2023-07-27 – 2023-07-31 (×11): 1 mg via INTRAVENOUS
  Filled 2023-07-27 (×11): qty 1

## 2023-07-27 MED ORDER — VANCOMYCIN HCL 1250 MG/250ML IV SOLN
1250.0000 mg | Freq: Once | INTRAVENOUS | Status: DC
  Filled 2023-07-27: qty 250

## 2023-07-27 MED ORDER — METHYLPREDNISOLONE SODIUM SUCC 125 MG IJ SOLR
125.0000 mg | Freq: Once | INTRAMUSCULAR | Status: AC
  Administered 2023-07-27: 125 mg via INTRAVENOUS
  Filled 2023-07-27: qty 2

## 2023-07-27 MED ORDER — POTASSIUM CHLORIDE 10 MEQ/100ML IV SOLN
10.0000 meq | INTRAVENOUS | Status: AC
Start: 2023-07-27 — End: 2023-07-27
  Administered 2023-07-27 (×4): 10 meq via INTRAVENOUS
  Filled 2023-07-27 (×3): qty 100

## 2023-07-27 MED ORDER — METHYLPREDNISOLONE SODIUM SUCC 125 MG IJ SOLR
60.0000 mg | Freq: Two times a day (BID) | INTRAMUSCULAR | Status: DC
  Administered 2023-07-28 – 2023-07-31 (×7): 60 mg via INTRAVENOUS
  Filled 2023-07-27 (×7): qty 2

## 2023-07-27 MED ORDER — VANCOMYCIN HCL 1750 MG/350ML IV SOLN
1750.0000 mg | INTRAVENOUS | Status: DC
  Administered 2023-07-27 – 2023-07-28 (×2): 1750 mg via INTRAVENOUS
  Filled 2023-07-27 (×2): qty 350

## 2023-07-27 MED ORDER — METOPROLOL TARTRATE 5 MG/5ML IV SOLN: 5.0000 mg | Freq: Once | INTRAVENOUS | Status: DC

## 2023-07-27 MED ORDER — POTASSIUM CHLORIDE CRYS ER 20 MEQ PO TBCR
40.0000 meq | EXTENDED_RELEASE_TABLET | Freq: Two times a day (BID) | ORAL | Status: DC
  Administered 2023-07-27 – 2023-07-28 (×3): 40 meq via ORAL
  Filled 2023-07-27 (×4): qty 2

## 2023-07-27 MED ORDER — CALCIUM GLUCONATE-NACL 2-0.675 GM/100ML-% IV SOLN
2.0000 g | Freq: Once | INTRAVENOUS | Status: AC
  Administered 2023-07-27: 2000 mg via INTRAVENOUS
  Filled 2023-07-27: qty 100

## 2023-07-27 MED ORDER — IOHEXOL 350 MG/ML SOLN
75.0000 mL | Freq: Once | INTRAVENOUS | Status: AC | PRN
  Administered 2023-07-27: 75 mL via INTRAVENOUS

## 2023-07-27 MED ORDER — POTASSIUM CHLORIDE 10 MEQ/100ML IV SOLN
10.0000 meq | INTRAVENOUS | Status: AC
  Administered 2023-07-27 – 2023-07-28 (×4): 10 meq via INTRAVENOUS
  Filled 2023-07-27 (×4): qty 100

## 2023-07-27 MED ORDER — METOPROLOL TARTRATE 5 MG/5ML IV SOLN
5.0000 mg | Freq: Four times a day (QID) | INTRAVENOUS | Status: DC
  Administered 2023-07-27 – 2023-08-04 (×31): 5 mg via INTRAVENOUS
  Filled 2023-07-27 (×31): qty 5

## 2023-07-27 MED ORDER — SODIUM CHLORIDE 0.9 % IV SOLN
2.0000 g | Freq: Three times a day (TID) | INTRAVENOUS | Status: AC
  Administered 2023-07-27 – 2023-07-28 (×2): 2 g via INTRAVENOUS
  Filled 2023-07-27 (×2): qty 12.5

## 2023-07-27 MED ORDER — POTASSIUM PHOSPHATES 15 MMOLE/5ML IV SOLN
30.0000 mmol | Freq: Once | INTRAVENOUS | Status: AC
  Administered 2023-07-27: 30 mmol via INTRAVENOUS
  Filled 2023-07-27: qty 10

## 2023-07-27 NOTE — Progress Notes (Signed)
Pharmacy Antibiotic Note  William Jones is a 72 y.o. male admitted on 07/10/2023 with pneumonia.  Pharmacy has been consulted for Vanco, Cefepime dosing.  ID: UTI , sepsis  - 10/23: Resume abx empirically for HCAP with difficulty breathing. Afebrile, WBC 8.6, Scr <1  10/6 CTX >> 10/8 meropenem >> 10/9, 10/20 x 5d 10/20: Azithro x 5d 10/10 Unasyn >> 10/13 10/8 vanc >> 10/10, 10/23>> 10/23 Cefepime>>  10/6 ucx: >100K kleb oxytoca (R-Amp; no CLSI brkpoint for Ancef) 10/8 bcx x2: neg FINAL   Plan: - Vancomycin 1750 mg IV Q 24 hrs. Goal AUC 400-550. Expected AUC: 488, SCr used: 0.8 - Cefepime 2g IV q8hr    Height: 5\' 9"  (175.3 cm) Weight: 65.7 kg (144 lb 13.5 oz) IBW/kg (Calculated) : 70.7  Temp (24hrs), Avg:98.5 F (36.9 C), Min:98.2 F (36.8 C), Max:98.8 F (37.1 C)  Recent Labs  Lab 07/23/23 0536 07/23/23 1953 07/24/23 0528 07/24/23 1514 07/24/23 1659 07/24/23 1941 07/25/23 0256 07/25/23 1403 07/26/23 0319 07/26/23 1741 07/27/23 0302  WBC 9.9  --  9.8 9.3 8.8  --  8.6  --   --   --   --   CREATININE 0.43*   < > 0.43* 0.43*  --  0.47* 0.37* 0.43* 0.45* 0.51* 0.41*  LATICACIDVEN  --   --   --   --  2.3* 2.5*  --   --   --   --   --    < > = values in this interval not displayed.    Estimated Creatinine Clearance: 79.8 mL/min (A) (by C-G formula based on SCr of 0.41 mg/dL (L)).    Allergies  Allergen Reactions   Metformin And Related Other (See Comments)    Bad headaches    Kean Gautreau S. Merilynn Finland, PharmD, BCPS Clinical Staff Pharmacist [email or web address]  Pasty Spillers 07/27/2023 9:51 AM

## 2023-07-27 NOTE — Progress Notes (Signed)
OT Cancellation Note  Patient Details Name: RIVAAN MCNELIS MRN: [id number] DOB: 11/05/1950   Cancelled Treatment:    Reason Eval/Treat Not Completed: Medical issues which prohibited therapy Nurse asking for therapy to hold with patient recently placed back on BiPAP. OT to continue to follow and check back as schedule will allow.   Rosalio Loud, MS Acute Rehabilitation Department Office# [phone]  07/27/2023, 10:15 AM

## 2023-07-27 NOTE — Plan of Care (Signed)
  Problem: Pain Managment: Goal: General experience of comfort will improve Outcome: Progressing   Problem: Safety: Goal: Ability to remain free from injury will improve Outcome: Progressing   Problem: Coping: Goal: Ability to adjust to condition or change in health will improve Outcome: Not Progressing   Problem: Clinical Measurements: Goal: Respiratory complications will improve Outcome: Not Progressing

## 2023-07-27 NOTE — Consult Note (Signed)
Consultation Note Date: 07/27/2023   Patient Name: William Jones  DOB: 1951/01/12  MRN: [id number]  Age / Sex: 72 y.o., male   PCP: William Crocker, MD Referring Physician: Cathren Harsh, MD  Reason for Consultation: Establishing goals of care     Chief Complaint/History of Present Illness:   Patient is a 72yo male with a PMHx of HTN, HLD, prostate adenocarcinoma, and recent diagnosis of small cell carcinoma either with primary liver diagnosis or is secondary who was admitted on 07/26/2023 for management of worsening weakness. During hospitalization patient has received management for septic shock secondary to UTI, acute respiratory failure, and electrolyte imbalances. Oncology and PCCM consulted. Palliative medicine team consulted to assist with complex medical decision making.  Extensive review or EMR prior to presenting to bedside. Also discussed care with RN for medical updates.   Presented to bedside to meet with patient. Patient's daughter,  William Jones, present at bedside with patient. Patient's girlfriend, William Jones, joined conversation as well with patient's permission. Patient gave permission to speak with his daughter and friend William Jones.  Patient laying in bed on [place] and non rebreather. Able to remove non breather with patient's permission to engage in conversation. Introduced myself and the role of the palliative medicine team in patient's care. Patient able to discuss he is currently receiving management for the fluid on his lungs. He noted he is soon supposed to be taken to CT scan for further workout. Patient able to state this related to his "aggressive" cancer.   With permission, able to discuss pathways for medical care moving forward. Discussed importance of discussion with providers moving forward to make sure we are following his wishes regarding medical care. Discussed currently patient is receiving appropraite, aggressive medical care. Discussed this pathways  including aggressive medical interventions. Answered questions as able regarding this.  Then able to discuss alternative pathway for medical care could include transitioning to a comfort focused approach. Explained what comfort focused care would and would not entail. Explained this pathway would e focused on end of life care with patient's worsening respiratory status in the setting of his cancer. Spent time answering questions as able regarding this pathway for medical care as well.   After discussion, patient noted that he wanted to continue with appropraite, aggressive medical interventions at this time. Patient does not desire to transition to comfort focused care yet. Acknowledged this and explained importance of continued conversations with provider moving forward regarding his own medical care. Noted can always transition to comfort focused care if patient elects for this if respiratory status continued to worsen.   With patient's permission, able to speak with patient's "lady"/girlfriend outside of room. Answered questions for her as able. Expressed concern about patient's serious medical status. Taking care a moment at a time with knowledge patient could worsen rapidly with needing ICU care. William Jones acknowledged this and asked to be updated if changes.   Patient's daughter privately expressed concern over updating patient's girlfriend. Acknowledged that if patient gives permission for team to speak with her, will respect his decision and autonomy. Noted that should patient loss capacity, then [facility] would be contacted regarding decision making for the patient unless patient has completed ACP documentation stating otherwise.   Able to discuss care with RN later in day who again confirmed patient gives permission for his daughter to be updated first and then his friend/girlfriend, William Jones.   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  Saw [facility] (Dr. Isabel Jones) on 03/13/14. Cancer Noted 03/13/14 office visit. Gleason score 7. Plan CT Ab/pelvis with contrast, bone scan    Hyperlipidemia 11/20/2019   Iron deficiency anemia    Malignant tumor of prostate (HCC) 10/14/2020   Peripheral neuropathy    S/P radiation therapy 09/18/14-11/15/14   prostate/ 7800Gy/40sessions   Thrombocytopenia (HCC)    Social History   Socioeconomic History   Marital status: Widowed    Spouse name: William Jones   Number of children: 2   Years of education: Not on file   Highest education level: Not on file  Occupational History   Occupation: Disabled  Tobacco Use   Smoking status: Former    Current packs/day: 0.00    Average packs/day: 0.5 packs/day for 30.0 years (15.0 ttl pk-yrs)    Types: Cigarettes    Start date: 10/04/1977    Quit date: 10/05/2007    Years since quitting: 15.8   Smokeless tobacco: Never  Vaping Use   Vaping status: Never Used  Substance and Sexual Activity   Alcohol use: Not Currently    Alcohol/week: 0.0 standard drinks of alcohol    Comment: no alcohol for five years (per conversation 2020)   Drug use: No    Types: "Crack" cocaine, Marijuana    Comment: per Epic note 2009hx of crack cocaine use 12 years ago per pt, no current marijuana use per pt   Sexual activity: Yes  Other Topics Concern   Not on file  Social History  Narrative   On disability - frostbite. Used to work for [employer]. Retired in 1990. Has 2 kids. 6th grade. Resides with significant other.      Current Social History 06/08/2019        Patient lives with a significant other in a/an apartment which is 1 story/stories. There are steps up to the entrance the patient uses.       Patient's method of transportation is personal car.      The highest level of education was some high school.      The patient currently disabled.      Identified important Relationships are Office manager (Fiance) and [name] (Daughter)       Pets : None       Interests / Fun: "Going to the park and walking around. Go visit people that we know."       Current Stressors: "I don't have any"       Religious / Personal Beliefs: "Holiness"       L. Leward Quan, RN, BSN       Social Determinants of Health   Financial Resource Strain: Low Risk  (06/25/2020)   Overall Financial Resource Strain (CARDIA)    Difficulty of Paying Living Expenses: Not hard at all  Food Insecurity: No Food Insecurity (06/30/2023)   Hunger Vital Sign    Worried  About Running Out of Food in the Last Year: Never true    Ran Out of Food in the Last Year: Never true  Transportation Needs: No Transportation Needs (06/30/2023)   PRAPARE - Administrator, Civil Service (Medical): No    Lack of Transportation (Non-Medical): No  Physical Activity: Sufficiently Active (06/25/2020)   Exercise Vital Sign    Days of Exercise per Week: 5 days    Minutes of Exercise per Session: 150+ min  Stress: No Stress Concern Present (10/20/2022)   [employer] of Occupational Health - Occupational Stress Questionnaire    Feeling of Stress : Not at all  Social Connections: Moderately Integrated (06/25/2020)   Social Connection and Isolation Panel [NHANES]    Frequency of Communication with Friends and Family: More than three times a week    Frequency of Social Gatherings with Friends and  Family: Twice a week    Attends Religious Services: More than 4 times per year    Active Member of Clubs or Organizations: No    Attends Engineer, structural: Never    Marital Status: Living with partner   Family History  Problem Relation Age of Onset   Kidney failure Brother 20       has been on HD since age 58    Edema Mother        Legs   Arthritis Sister        knees   Stroke Maternal Uncle        70s-80s   Cancer Neg Hx    Scheduled Meds:  calcium carbonate  1 tablet Oral BID   Chlorhexidine Gluconate Cloth  6 each Topical Q2200   docusate sodium  100 mg Oral BID   dorzolamide  1 drop Right Eye BID   feeding supplement  1 Container Oral TID BM   furosemide  60 mg Intravenous BID   insulin aspart  0-15 Units Subcutaneous TID WC   insulin aspart  0-5 Units Subcutaneous QHS   insulin glargine-yfgn  8 Units Subcutaneous Daily   magnesium oxide  400 mg Oral BID   methylPREDNISolone (SOLU-MEDROL) injection  125 mg Intravenous Once   Followed by   Melene Muller ON 07/28/2023] methylPREDNISolone (SOLU-MEDROL) injection  60 mg Intravenous Q12H   metoprolol tartrate  5 mg Intravenous Q6H   pantoprazole  40 mg Oral BID AC   phosphorus  500 mg Oral BID   potassium chloride  40 mEq Oral BID   sodium chloride flush  3 mL Intravenous Q12H   timolol  1 drop Right Eye BID   Continuous Infusions:  ceFEPime (MAXIPIME) IV     vancomycin Stopped (07/27/23 1248)   PRN Meds:.acetaminophen **OR** acetaminophen, albuterol, bisacodyl, hydrALAZINE, HYDROmorphone (DILAUDID) injection, hydrOXYzine, labetalol, loperamide, ondansetron **OR** ondansetron (ZOFRAN) IV, mouth rinse, oxyCODONE, polyethylene glycol Allergies  Allergen Reactions   Metformin And Related Other (See Comments)    Bad headaches   CBC:    Component Value Date/Time   WBC 7.6 07/27/2023 1008   HGB 8.4 (L) 07/27/2023 1008   HGB 10.6 (L) 06/24/2023 1008   HCT 25.6 (L) 07/27/2023 1008   HCT 33.8 (L) 06/24/2023 1008    PLT 35 (L) 07/27/2023 1008   PLT 262 06/24/2023 1008   MCV 92.8 07/27/2023 1008   MCV 95 06/24/2023 1008   NEUTROABS 7.7 07/20/2023 0516   NEUTROABS 1.4 04/10/2019 1006   LYMPHSABS 0.5 (L) 07/20/2023 0516   LYMPHSABS 1.3 04/10/2019 1006   MONOABS 0.3  07/20/2023 0516   EOSABS 0.0 07/20/2023 0516   EOSABS 0.2 04/10/2019 1006   BASOSABS 0.0 07/20/2023 0516   BASOSABS 0.0 04/10/2019 1006   Comprehensive Metabolic Panel:    Component Value Date/Time   NA 142 07/27/2023 0302   NA 133 (L) 06/24/2023 1008   K 3.0 (L) 07/27/2023 0302   CL 100 07/27/2023 0302   CO2 31 07/27/2023 0302   BUN 11 07/27/2023 0302   BUN 26 06/24/2023 1008   CREATININE 0.41 (L) 07/27/2023 0302   CREATININE 1.02 07/07/2023 1547   GLUCOSE 135 (H) 07/27/2023 0302   CALCIUM 6.9 (L) 07/27/2023 0302   AST 39 07/26/2023 0319   AST 50 (H) 07/07/2023 1547   ALT 64 (H) 07/26/2023 0319   ALT 129 (H) 07/07/2023 1547   ALKPHOS 263 (H) 07/26/2023 0319   BILITOT 0.9 07/26/2023 0319   BILITOT 0.7 07/07/2023 1547   PROT 5.1 (L) 07/26/2023 0319   PROT 6.3 04/10/2019 1006   ALBUMIN 2.1 (L) 07/26/2023 0319   ALBUMIN 3.9 04/10/2019 1006    Physical Exam: Vital Signs: BP (!) 158/85   Pulse 93   Temp 98.1 F (36.7 C) (Oral)   Resp 16   Ht 5\' 9"  (1.753 m)   Wt 65.7 kg   SpO2 98%   BMI 21.39 kg/m  SpO2: SpO2: 98 % O2 Device: O2 Device: High Flow Nasal Cannula, NRB O2 Flow Rate: O2 Flow Rate (L/min): 15 L/min Intake/output summary:  Intake/Output Summary (Last 24 hours) at 07/27/2023 1750 Last data filed at 07/27/2023 1748 Gross per 24 hour  Intake 2121.19 ml  Output 4075 ml  Net -1953.81 ml   LBM: Last BM Date : 07/26/23 Baseline Weight: Weight: 68 kg Most recent weight: Weight: 65.7 kg  General: NAD, alert, laying in bed, chronically ill appearing, frail, cachectic HENT: dry mucous membranes Cardiovascular: RRR Respiratory: increased work of breathing noted on HFNC and non re breather  Skin: no rashes  or lesions on visible skin Neuro: A&Ox4 Psych: appropriately answers all questions          Palliative Performance Scale: 20%              Additional Data Reviewed: Recent Labs    07/25/23 0256 07/25/23 1403 07/26/23 1741 07/27/23 0302 07/27/23 1008  WBC 8.6  --   --   --  7.6  HGB 7.9*  --   --   --  8.4*  PLT 111*  --   --   --  35*  NA 141   < > 142 142  --   BUN 8   < > 12 11  --   CREATININE 0.37*   < > 0.51* 0.41*  --    < > = values in this interval not displayed.    Imaging: DG CHEST PORT 1 VIEW CLINICAL DATA:  72 year old male with hypoxia. Metastatic cancer, inpatient chemotherapy, sepsis.  EXAM: PORTABLE CHEST 1 VIEW  COMPARISON:  Portable chest 07/24/2023 and earlier.  FINDINGS: Portable AP semi upright view at 0843 hours. Slightly lower lung volumes. Increased diffuse and coarse reticulonodular lung opacity, still asymmetrically greater on the left. No superimposed pneumothorax, consolidation, pleural effusion. Visualized tracheal air column is within normal limits. No acute osseous abnormality identified. Negative visible bowel gas.  IMPRESSION: Progressed asymmetric, diffuse pulmonary interstitial opacity since 07/24/2023. Top differential considerations are drug reaction, progressed asymmetric pulmonary edema and viral/atypical respiratory infection.  Electronically Signed   By: Althea Grimmer.D.  On: 07/27/2023 11:55    I personally reviewed recent imaging.   Palliative Care Assessment and Plan Summary of Established Goals of Care and Medical Treatment Preferences   Patient is a 72yo male with a PMHx of HTN, HLD, prostate adenocarcinoma, and recent diagnosis of small cell carcinoma either with primary liver diagnosis or is secondary who was admitted on 07/26/2023 for management of worsening weakness. During hospitalization patient has received management for septic shock secondary to UTI, acute respiratory failure, and electrolyte imbalances.  Oncology and PCCM consulted. Palliative medicine team consulted to assist with complex medical decision making.  # Complex medical decision making/goals of care  -Extensive discussion with patient while daughter and friend/girlfriend present at bedside as detailed above in HPI. Discussed possible pathways for medical care moving forward including continuing appropraite, aggressive medical interventions versus transitioning to comfort focused care. Patient has elected to continue with appropraite, aggressive medical interventions at this time. Have informed him can always transition to comfort focused care if desired.   -Patient gave permission to this provider and RNs that his daughter should be called first with updates and then his friend/girlfriend, William Jones.   -  Code Status: Limited: Do not attempt resuscitation (DNR) -DNR-LIMITED -Do Not Intubate/DNI    # Symptom management  -As per PCCM  # Psycho-social/Spiritual Support:  - Support System: daughter, friend/girlfriend  # Discharge Planning:  To Be Determined  Thank you for allowing the palliative care team to participate in the care [facility].  Alvester Morin, DO Palliative Care Provider PMT # [phone]  If patient remains symptomatic despite maximum doses, please call PMT at [phone] between 0700 and 1900. Outside of these hours, please call attending, as PMT does not have night coverage.  This provider spent a total of 80 minutes providing patient's care.  Includes review of EMR, discussing care with other staff members involved in patient's medical care, obtaining relevant history and information from patient and/or patient's family, and personal review of imaging and lab work. Greater than 50% of the time was spent counseling and coordinating care related to the above assessment and plan.    *Please note that this is a verbal dictation therefore any spelling or grammatical errors are due to the "[employer] One"  system interpretation.

## 2023-07-27 NOTE — Progress Notes (Signed)
Pharmacy Electrolyte Replacement  Recent Labs:  Recent Labs    07/27/23 0302 07/27/23 1729  K 3.0* 3.2*  MG 2.0  --   PHOS 2.1*  --   CREATININE 0.41* 0.45*   Patient diuresing extensively on BID IV Lasix since 10/20 for fluid overload and increased WOB. with great difficulty keeping up with electrolytes. Asked by Dr. Rhona Leavens 10/22 for pharmacy to help with monitoring and electrolyte supplementation. - We are also contributing to fluid overload with all the electrolyte supplementation bags of fluid.  UOP 10/20: 4225 UOP 10/21: 2550 UOP:10/22: 3525 ml  Plan:  Regular diet so will try to utilize some oral supplementation  - K 3.2:  Iv x 4, Kdur 40 BID scheduled   - BMET, Mg, Phos daily - When IV Lasix stops, we will have to be vigilant to decrease/stop all these supplements.   Hessie Knows, PharmD, BCPS Secure Chat if ?s 07/27/2023 6:25 PM

## 2023-07-27 NOTE — Progress Notes (Signed)
Rt placed pt on BIPAP due to WOB. NP and MD at bedside.

## 2023-07-27 NOTE — Progress Notes (Signed)
otherwise unremarkable. Other: Small fat containing umbilical hernia. Musculoskeletal: No lytic or blastic bone lesions. No acute bone abnormality. Osseous structures are age-appropriate. IMPRESSION: 1. Innumerable hypoenhancing masses throughout the liver, most in keeping with bilobar hepatic metastatic disease or a primary hepatic malignancy such as multifocal hepatocellular carcinoma. Correlation with serum alpha  fetoprotein level may be helpful. If indicated, widespread hepatic metastatic disease should be easily amenable to ultrasound-guided tissue sampling for further evaluation. 2. Bilateral adrenal metastases. 3. Mild coronary artery calcification. 4. Nonobstructing right renal calculus. Aortic Atherosclerosis (ICD10-I70.0). Electronically Signed   By: Helyn Numbers M.D.   On: 06/30/2023 23:17   MR BRAIN W WO CONTRAST  Result Date: 06/30/2023 CLINICAL DATA:  Metastatic disease evaluation. EXAM: MRI HEAD WITHOUT AND WITH CONTRAST TECHNIQUE: Multiplanar, multiecho pulse sequences of the brain and surrounding structures were obtained without and with intravenous contrast. CONTRAST:  8mL GADAVIST GADOBUTROL 1 MMOL/ML IV SOLN COMPARISON:  Head CT 02/12/2018. FINDINGS: Brain: No acute infarct or hemorrhage. No hydrocephalus or extra-axial collection. No foci of abnormal susceptibility. No mass or abnormal enhancement. Vascular: Normal flow voids and vessel enhancement. Skull and upper cervical spine: Normal marrow signal and enhancement. Sinuses/Orbits: Near-complete opacification of the frontal sinuses, maxillary sinuses and right sphenoid sinus. Orbits are unremarkable. Other: None. IMPRESSION: 1. No evidence of intracranial metastatic disease. 2. Near-complete opacification of the frontal sinuses, maxillary sinuses and right sphenoid sinus. Electronically Signed   By: Orvan Falconer M.D.   On: 06/30/2023 21:43   US Abdomen Limited RUQ (LIVER/GB)  Result Date: 06/30/2023 CLINICAL DATA:  Elevated liver enzymes EXAM: ULTRASOUND ABDOMEN LIMITED RIGHT UPPER QUADRANT COMPARISON:  None Available. FINDINGS: Gallbladder: No gallstones or wall thickening visualized. No sonographic Murphy sign noted by sonographer. Common bile duct: Diameter: 0.2 cm Liver: Scattered mostly echogenic nodules are present throughout the liver. These were not present the CT of 10/30/2018. Some have a targetoid appearance as on image 37 of  series 1. Appearance suspicious for diffuse hepatic metastatic disease. Portal vein is patent on color Doppler imaging with normal direction of blood flow towards the liver. Other: None. IMPRESSION: 1. Numerous echogenic nodules throughout the liver, suspicious for diffuse hepatic metastatic disease. Consider further workup such as CT of the chest, abdomen, pelvis with contrast for further characterization and to assess for potential primary. PET-CT could also be an alternative imaging choice or adjunct. The patient has a history of prostate cancer but this would be an unusual metastatic pathway for prostate cancer, and a gastrointestinal primary or liver primary would be more common. The possibility of macronodular cirrhosis is not entirely discarded but seems less likely given the appearance. Electronically Signed   By: Gaylyn Rong M.D.   On: 06/30/2023 13:45   ECHOCARDIOGRAM COMPLETE  Result Date: 06/30/2023    ECHOCARDIOGRAM REPORT   Patient Name:   William Jones Date of Exam: 06/30/2023 Medical Rec #:  [id number]             Height:       68.0 in Accession #:    [id number]            Weight:       175.7 lb Date of Birth:  Apr 11, 1951              BSA:          1.934 m Patient Age:    72 years              BP:  otherwise unremarkable. Other: Small fat containing umbilical hernia. Musculoskeletal: No lytic or blastic bone lesions. No acute bone abnormality. Osseous structures are age-appropriate. IMPRESSION: 1. Innumerable hypoenhancing masses throughout the liver, most in keeping with bilobar hepatic metastatic disease or a primary hepatic malignancy such as multifocal hepatocellular carcinoma. Correlation with serum alpha  fetoprotein level may be helpful. If indicated, widespread hepatic metastatic disease should be easily amenable to ultrasound-guided tissue sampling for further evaluation. 2. Bilateral adrenal metastases. 3. Mild coronary artery calcification. 4. Nonobstructing right renal calculus. Aortic Atherosclerosis (ICD10-I70.0). Electronically Signed   By: Helyn Numbers M.D.   On: 06/30/2023 23:17   MR BRAIN W WO CONTRAST  Result Date: 06/30/2023 CLINICAL DATA:  Metastatic disease evaluation. EXAM: MRI HEAD WITHOUT AND WITH CONTRAST TECHNIQUE: Multiplanar, multiecho pulse sequences of the brain and surrounding structures were obtained without and with intravenous contrast. CONTRAST:  8mL GADAVIST GADOBUTROL 1 MMOL/ML IV SOLN COMPARISON:  Head CT 02/12/2018. FINDINGS: Brain: No acute infarct or hemorrhage. No hydrocephalus or extra-axial collection. No foci of abnormal susceptibility. No mass or abnormal enhancement. Vascular: Normal flow voids and vessel enhancement. Skull and upper cervical spine: Normal marrow signal and enhancement. Sinuses/Orbits: Near-complete opacification of the frontal sinuses, maxillary sinuses and right sphenoid sinus. Orbits are unremarkable. Other: None. IMPRESSION: 1. No evidence of intracranial metastatic disease. 2. Near-complete opacification of the frontal sinuses, maxillary sinuses and right sphenoid sinus. Electronically Signed   By: Orvan Falconer M.D.   On: 06/30/2023 21:43   US Abdomen Limited RUQ (LIVER/GB)  Result Date: 06/30/2023 CLINICAL DATA:  Elevated liver enzymes EXAM: ULTRASOUND ABDOMEN LIMITED RIGHT UPPER QUADRANT COMPARISON:  None Available. FINDINGS: Gallbladder: No gallstones or wall thickening visualized. No sonographic Murphy sign noted by sonographer. Common bile duct: Diameter: 0.2 cm Liver: Scattered mostly echogenic nodules are present throughout the liver. These were not present the CT of 10/30/2018. Some have a targetoid appearance as on image 37 of  series 1. Appearance suspicious for diffuse hepatic metastatic disease. Portal vein is patent on color Doppler imaging with normal direction of blood flow towards the liver. Other: None. IMPRESSION: 1. Numerous echogenic nodules throughout the liver, suspicious for diffuse hepatic metastatic disease. Consider further workup such as CT of the chest, abdomen, pelvis with contrast for further characterization and to assess for potential primary. PET-CT could also be an alternative imaging choice or adjunct. The patient has a history of prostate cancer but this would be an unusual metastatic pathway for prostate cancer, and a gastrointestinal primary or liver primary would be more common. The possibility of macronodular cirrhosis is not entirely discarded but seems less likely given the appearance. Electronically Signed   By: Gaylyn Rong M.D.   On: 06/30/2023 13:45   ECHOCARDIOGRAM COMPLETE  Result Date: 06/30/2023    ECHOCARDIOGRAM REPORT   Patient Name:   William Jones Date of Exam: 06/30/2023 Medical Rec #:  [id number]             Height:       68.0 in Accession #:    [id number]            Weight:       175.7 lb Date of Birth:  Apr 11, 1951              BSA:          1.934 m Patient Age:    72 years              BP:  Abdomen Limited RUQ (LIVER/GB)  Result Date: 06/30/2023 CLINICAL DATA:  Elevated liver enzymes EXAM: ULTRASOUND ABDOMEN LIMITED RIGHT UPPER QUADRANT COMPARISON:  None Available. FINDINGS: Gallbladder: No gallstones or wall thickening visualized. No sonographic Murphy sign noted by sonographer. Common bile duct: Diameter: 0.2 cm Liver: Scattered mostly echogenic nodules are present throughout the liver. These were not present the CT of 10/30/2018. Some have a targetoid appearance as on image 37 of series 1. Appearance suspicious for diffuse hepatic metastatic disease. Portal vein is patent on color Doppler imaging with normal direction of blood flow towards the liver. Other: None. IMPRESSION: 1. Numerous echogenic nodules throughout the liver, suspicious for diffuse hepatic metastatic disease. Consider further workup such as CT of the chest, abdomen, pelvis with contrast for further characterization and to assess for potential primary. PET-CT could also be an alternative imaging choice or adjunct. The patient has a history of prostate cancer but this would be an unusual metastatic pathway for prostate cancer, and a gastrointestinal primary or liver primary would be more common. The possibility of macronodular cirrhosis is not entirely discarded but seems less likely given the appearance. Electronically Signed   By: Gaylyn Rong M.D.   On: 06/30/2023 13:45   ECHOCARDIOGRAM COMPLETE  Result Date: 06/30/2023    ECHOCARDIOGRAM REPORT   Patient Name:   William Jones Date of Exam: 06/30/2023 Medical Rec #:  [id number]             Height:       68.0 in Accession #:    [id number]            Weight:       175.7 lb Date of Birth:  1951/01/18              BSA:           1.934 m Patient Age:    72 years              BP:           146/71 mmHg Patient Gender: M                     HR:           71 bpm. Exam Location:  Inpatient Procedure: 2D Echo, Cardiac Doppler and Color Doppler Indications:    R06.9 DOE; R60.0 Lower extremity edema; R53.83 Fatigue  History:        Patient has no prior history of Echocardiogram examinations.                 Signs/Symptoms:Dyspnea, Edema and Fatigue; Risk                 Factors:Hypertension, Diabetes, Former Smoker, History of                 substance abuse. and Dyslipidemia. Patient denies chest pain. He                 does have DOE, leg edema and extreme fatigue since 06/06/2023.  Sonographer:    Carlos American RVT, RDCS (AE), RDMS Referring Phys: [id number] EMILY B MULLEN  Sonographer Comments: Suboptimal apical window. Image acquisition challenging due to respiratory motion. IMPRESSIONS  1. Left ventricular ejection fraction, by estimation, is 60 to 65%. The left ventricle has normal function. The left ventricle has no regional wall motion abnormalities. There is mild concentric left ventricular hypertrophy. Left ventricular diastolic parameters are consistent with Grade I diastolic dysfunction (impaired relaxation).  Abdomen Limited RUQ (LIVER/GB)  Result Date: 06/30/2023 CLINICAL DATA:  Elevated liver enzymes EXAM: ULTRASOUND ABDOMEN LIMITED RIGHT UPPER QUADRANT COMPARISON:  None Available. FINDINGS: Gallbladder: No gallstones or wall thickening visualized. No sonographic Murphy sign noted by sonographer. Common bile duct: Diameter: 0.2 cm Liver: Scattered mostly echogenic nodules are present throughout the liver. These were not present the CT of 10/30/2018. Some have a targetoid appearance as on image 37 of series 1. Appearance suspicious for diffuse hepatic metastatic disease. Portal vein is patent on color Doppler imaging with normal direction of blood flow towards the liver. Other: None. IMPRESSION: 1. Numerous echogenic nodules throughout the liver, suspicious for diffuse hepatic metastatic disease. Consider further workup such as CT of the chest, abdomen, pelvis with contrast for further characterization and to assess for potential primary. PET-CT could also be an alternative imaging choice or adjunct. The patient has a history of prostate cancer but this would be an unusual metastatic pathway for prostate cancer, and a gastrointestinal primary or liver primary would be more common. The possibility of macronodular cirrhosis is not entirely discarded but seems less likely given the appearance. Electronically Signed   By: Gaylyn Rong M.D.   On: 06/30/2023 13:45   ECHOCARDIOGRAM COMPLETE  Result Date: 06/30/2023    ECHOCARDIOGRAM REPORT   Patient Name:   William Jones Date of Exam: 06/30/2023 Medical Rec #:  [id number]             Height:       68.0 in Accession #:    [id number]            Weight:       175.7 lb Date of Birth:  1951/01/18              BSA:           1.934 m Patient Age:    72 years              BP:           146/71 mmHg Patient Gender: M                     HR:           71 bpm. Exam Location:  Inpatient Procedure: 2D Echo, Cardiac Doppler and Color Doppler Indications:    R06.9 DOE; R60.0 Lower extremity edema; R53.83 Fatigue  History:        Patient has no prior history of Echocardiogram examinations.                 Signs/Symptoms:Dyspnea, Edema and Fatigue; Risk                 Factors:Hypertension, Diabetes, Former Smoker, History of                 substance abuse. and Dyslipidemia. Patient denies chest pain. He                 does have DOE, leg edema and extreme fatigue since 06/06/2023.  Sonographer:    Carlos American RVT, RDCS (AE), RDMS Referring Phys: [id number] EMILY B MULLEN  Sonographer Comments: Suboptimal apical window. Image acquisition challenging due to respiratory motion. IMPRESSIONS  1. Left ventricular ejection fraction, by estimation, is 60 to 65%. The left ventricle has normal function. The left ventricle has no regional wall motion abnormalities. There is mild concentric left ventricular hypertrophy. Left ventricular diastolic parameters are consistent with Grade I diastolic dysfunction (impaired relaxation).  Abdomen Limited RUQ (LIVER/GB)  Result Date: 06/30/2023 CLINICAL DATA:  Elevated liver enzymes EXAM: ULTRASOUND ABDOMEN LIMITED RIGHT UPPER QUADRANT COMPARISON:  None Available. FINDINGS: Gallbladder: No gallstones or wall thickening visualized. No sonographic Murphy sign noted by sonographer. Common bile duct: Diameter: 0.2 cm Liver: Scattered mostly echogenic nodules are present throughout the liver. These were not present the CT of 10/30/2018. Some have a targetoid appearance as on image 37 of series 1. Appearance suspicious for diffuse hepatic metastatic disease. Portal vein is patent on color Doppler imaging with normal direction of blood flow towards the liver. Other: None. IMPRESSION: 1. Numerous echogenic nodules throughout the liver, suspicious for diffuse hepatic metastatic disease. Consider further workup such as CT of the chest, abdomen, pelvis with contrast for further characterization and to assess for potential primary. PET-CT could also be an alternative imaging choice or adjunct. The patient has a history of prostate cancer but this would be an unusual metastatic pathway for prostate cancer, and a gastrointestinal primary or liver primary would be more common. The possibility of macronodular cirrhosis is not entirely discarded but seems less likely given the appearance. Electronically Signed   By: Gaylyn Rong M.D.   On: 06/30/2023 13:45   ECHOCARDIOGRAM COMPLETE  Result Date: 06/30/2023    ECHOCARDIOGRAM REPORT   Patient Name:   William Jones Date of Exam: 06/30/2023 Medical Rec #:  [id number]             Height:       68.0 in Accession #:    [id number]            Weight:       175.7 lb Date of Birth:  1951/01/18              BSA:           1.934 m Patient Age:    72 years              BP:           146/71 mmHg Patient Gender: M                     HR:           71 bpm. Exam Location:  Inpatient Procedure: 2D Echo, Cardiac Doppler and Color Doppler Indications:    R06.9 DOE; R60.0 Lower extremity edema; R53.83 Fatigue  History:        Patient has no prior history of Echocardiogram examinations.                 Signs/Symptoms:Dyspnea, Edema and Fatigue; Risk                 Factors:Hypertension, Diabetes, Former Smoker, History of                 substance abuse. and Dyslipidemia. Patient denies chest pain. He                 does have DOE, leg edema and extreme fatigue since 06/06/2023.  Sonographer:    Carlos American RVT, RDCS (AE), RDMS Referring Phys: [id number] EMILY B MULLEN  Sonographer Comments: Suboptimal apical window. Image acquisition challenging due to respiratory motion. IMPRESSIONS  1. Left ventricular ejection fraction, by estimation, is 60 to 65%. The left ventricle has normal function. The left ventricle has no regional wall motion abnormalities. There is mild concentric left ventricular hypertrophy. Left ventricular diastolic parameters are consistent with Grade I diastolic dysfunction (impaired relaxation).  Abdomen Limited RUQ (LIVER/GB)  Result Date: 06/30/2023 CLINICAL DATA:  Elevated liver enzymes EXAM: ULTRASOUND ABDOMEN LIMITED RIGHT UPPER QUADRANT COMPARISON:  None Available. FINDINGS: Gallbladder: No gallstones or wall thickening visualized. No sonographic Murphy sign noted by sonographer. Common bile duct: Diameter: 0.2 cm Liver: Scattered mostly echogenic nodules are present throughout the liver. These were not present the CT of 10/30/2018. Some have a targetoid appearance as on image 37 of series 1. Appearance suspicious for diffuse hepatic metastatic disease. Portal vein is patent on color Doppler imaging with normal direction of blood flow towards the liver. Other: None. IMPRESSION: 1. Numerous echogenic nodules throughout the liver, suspicious for diffuse hepatic metastatic disease. Consider further workup such as CT of the chest, abdomen, pelvis with contrast for further characterization and to assess for potential primary. PET-CT could also be an alternative imaging choice or adjunct. The patient has a history of prostate cancer but this would be an unusual metastatic pathway for prostate cancer, and a gastrointestinal primary or liver primary would be more common. The possibility of macronodular cirrhosis is not entirely discarded but seems less likely given the appearance. Electronically Signed   By: Gaylyn Rong M.D.   On: 06/30/2023 13:45   ECHOCARDIOGRAM COMPLETE  Result Date: 06/30/2023    ECHOCARDIOGRAM REPORT   Patient Name:   William Jones Date of Exam: 06/30/2023 Medical Rec #:  [id number]             Height:       68.0 in Accession #:    [id number]            Weight:       175.7 lb Date of Birth:  1951/01/18              BSA:           1.934 m Patient Age:    72 years              BP:           146/71 mmHg Patient Gender: M                     HR:           71 bpm. Exam Location:  Inpatient Procedure: 2D Echo, Cardiac Doppler and Color Doppler Indications:    R06.9 DOE; R60.0 Lower extremity edema; R53.83 Fatigue  History:        Patient has no prior history of Echocardiogram examinations.                 Signs/Symptoms:Dyspnea, Edema and Fatigue; Risk                 Factors:Hypertension, Diabetes, Former Smoker, History of                 substance abuse. and Dyslipidemia. Patient denies chest pain. He                 does have DOE, leg edema and extreme fatigue since 06/06/2023.  Sonographer:    Carlos American RVT, RDCS (AE), RDMS Referring Phys: [id number] EMILY B MULLEN  Sonographer Comments: Suboptimal apical window. Image acquisition challenging due to respiratory motion. IMPRESSIONS  1. Left ventricular ejection fraction, by estimation, is 60 to 65%. The left ventricle has normal function. The left ventricle has no regional wall motion abnormalities. There is mild concentric left ventricular hypertrophy. Left ventricular diastolic parameters are consistent with Grade I diastolic dysfunction (impaired relaxation).  Abdomen Limited RUQ (LIVER/GB)  Result Date: 06/30/2023 CLINICAL DATA:  Elevated liver enzymes EXAM: ULTRASOUND ABDOMEN LIMITED RIGHT UPPER QUADRANT COMPARISON:  None Available. FINDINGS: Gallbladder: No gallstones or wall thickening visualized. No sonographic Murphy sign noted by sonographer. Common bile duct: Diameter: 0.2 cm Liver: Scattered mostly echogenic nodules are present throughout the liver. These were not present the CT of 10/30/2018. Some have a targetoid appearance as on image 37 of series 1. Appearance suspicious for diffuse hepatic metastatic disease. Portal vein is patent on color Doppler imaging with normal direction of blood flow towards the liver. Other: None. IMPRESSION: 1. Numerous echogenic nodules throughout the liver, suspicious for diffuse hepatic metastatic disease. Consider further workup such as CT of the chest, abdomen, pelvis with contrast for further characterization and to assess for potential primary. PET-CT could also be an alternative imaging choice or adjunct. The patient has a history of prostate cancer but this would be an unusual metastatic pathway for prostate cancer, and a gastrointestinal primary or liver primary would be more common. The possibility of macronodular cirrhosis is not entirely discarded but seems less likely given the appearance. Electronically Signed   By: Gaylyn Rong M.D.   On: 06/30/2023 13:45   ECHOCARDIOGRAM COMPLETE  Result Date: 06/30/2023    ECHOCARDIOGRAM REPORT   Patient Name:   William Jones Date of Exam: 06/30/2023 Medical Rec #:  [id number]             Height:       68.0 in Accession #:    [id number]            Weight:       175.7 lb Date of Birth:  1951/01/18              BSA:           1.934 m Patient Age:    72 years              BP:           146/71 mmHg Patient Gender: M                     HR:           71 bpm. Exam Location:  Inpatient Procedure: 2D Echo, Cardiac Doppler and Color Doppler Indications:    R06.9 DOE; R60.0 Lower extremity edema; R53.83 Fatigue  History:        Patient has no prior history of Echocardiogram examinations.                 Signs/Symptoms:Dyspnea, Edema and Fatigue; Risk                 Factors:Hypertension, Diabetes, Former Smoker, History of                 substance abuse. and Dyslipidemia. Patient denies chest pain. He                 does have DOE, leg edema and extreme fatigue since 06/06/2023.  Sonographer:    Carlos American RVT, RDCS (AE), RDMS Referring Phys: [id number] EMILY B MULLEN  Sonographer Comments: Suboptimal apical window. Image acquisition challenging due to respiratory motion. IMPRESSIONS  1. Left ventricular ejection fraction, by estimation, is 60 to 65%. The left ventricle has normal function. The left ventricle has no regional wall motion abnormalities. There is mild concentric left ventricular hypertrophy. Left ventricular diastolic parameters are consistent with Grade I diastolic dysfunction (impaired relaxation).  Abdomen Limited RUQ (LIVER/GB)  Result Date: 06/30/2023 CLINICAL DATA:  Elevated liver enzymes EXAM: ULTRASOUND ABDOMEN LIMITED RIGHT UPPER QUADRANT COMPARISON:  None Available. FINDINGS: Gallbladder: No gallstones or wall thickening visualized. No sonographic Murphy sign noted by sonographer. Common bile duct: Diameter: 0.2 cm Liver: Scattered mostly echogenic nodules are present throughout the liver. These were not present the CT of 10/30/2018. Some have a targetoid appearance as on image 37 of series 1. Appearance suspicious for diffuse hepatic metastatic disease. Portal vein is patent on color Doppler imaging with normal direction of blood flow towards the liver. Other: None. IMPRESSION: 1. Numerous echogenic nodules throughout the liver, suspicious for diffuse hepatic metastatic disease. Consider further workup such as CT of the chest, abdomen, pelvis with contrast for further characterization and to assess for potential primary. PET-CT could also be an alternative imaging choice or adjunct. The patient has a history of prostate cancer but this would be an unusual metastatic pathway for prostate cancer, and a gastrointestinal primary or liver primary would be more common. The possibility of macronodular cirrhosis is not entirely discarded but seems less likely given the appearance. Electronically Signed   By: Gaylyn Rong M.D.   On: 06/30/2023 13:45   ECHOCARDIOGRAM COMPLETE  Result Date: 06/30/2023    ECHOCARDIOGRAM REPORT   Patient Name:   William Jones Date of Exam: 06/30/2023 Medical Rec #:  [id number]             Height:       68.0 in Accession #:    [id number]            Weight:       175.7 lb Date of Birth:  1951/01/18              BSA:           1.934 m Patient Age:    72 years              BP:           146/71 mmHg Patient Gender: M                     HR:           71 bpm. Exam Location:  Inpatient Procedure: 2D Echo, Cardiac Doppler and Color Doppler Indications:    R06.9 DOE; R60.0 Lower extremity edema; R53.83 Fatigue  History:        Patient has no prior history of Echocardiogram examinations.                 Signs/Symptoms:Dyspnea, Edema and Fatigue; Risk                 Factors:Hypertension, Diabetes, Former Smoker, History of                 substance abuse. and Dyslipidemia. Patient denies chest pain. He                 does have DOE, leg edema and extreme fatigue since 06/06/2023.  Sonographer:    Carlos American RVT, RDCS (AE), RDMS Referring Phys: [id number] EMILY B MULLEN  Sonographer Comments: Suboptimal apical window. Image acquisition challenging due to respiratory motion. IMPRESSIONS  1. Left ventricular ejection fraction, by estimation, is 60 to 65%. The left ventricle has normal function. The left ventricle has no regional wall motion abnormalities. There is mild concentric left ventricular hypertrophy. Left ventricular diastolic parameters are consistent with Grade I diastolic dysfunction (impaired relaxation).  Abdomen Limited RUQ (LIVER/GB)  Result Date: 06/30/2023 CLINICAL DATA:  Elevated liver enzymes EXAM: ULTRASOUND ABDOMEN LIMITED RIGHT UPPER QUADRANT COMPARISON:  None Available. FINDINGS: Gallbladder: No gallstones or wall thickening visualized. No sonographic Murphy sign noted by sonographer. Common bile duct: Diameter: 0.2 cm Liver: Scattered mostly echogenic nodules are present throughout the liver. These were not present the CT of 10/30/2018. Some have a targetoid appearance as on image 37 of series 1. Appearance suspicious for diffuse hepatic metastatic disease. Portal vein is patent on color Doppler imaging with normal direction of blood flow towards the liver. Other: None. IMPRESSION: 1. Numerous echogenic nodules throughout the liver, suspicious for diffuse hepatic metastatic disease. Consider further workup such as CT of the chest, abdomen, pelvis with contrast for further characterization and to assess for potential primary. PET-CT could also be an alternative imaging choice or adjunct. The patient has a history of prostate cancer but this would be an unusual metastatic pathway for prostate cancer, and a gastrointestinal primary or liver primary would be more common. The possibility of macronodular cirrhosis is not entirely discarded but seems less likely given the appearance. Electronically Signed   By: Gaylyn Rong M.D.   On: 06/30/2023 13:45   ECHOCARDIOGRAM COMPLETE  Result Date: 06/30/2023    ECHOCARDIOGRAM REPORT   Patient Name:   William Jones Date of Exam: 06/30/2023 Medical Rec #:  [id number]             Height:       68.0 in Accession #:    [id number]            Weight:       175.7 lb Date of Birth:  1951/01/18              BSA:           1.934 m Patient Age:    72 years              BP:           146/71 mmHg Patient Gender: M                     HR:           71 bpm. Exam Location:  Inpatient Procedure: 2D Echo, Cardiac Doppler and Color Doppler Indications:    R06.9 DOE; R60.0 Lower extremity edema; R53.83 Fatigue  History:        Patient has no prior history of Echocardiogram examinations.                 Signs/Symptoms:Dyspnea, Edema and Fatigue; Risk                 Factors:Hypertension, Diabetes, Former Smoker, History of                 substance abuse. and Dyslipidemia. Patient denies chest pain. He                 does have DOE, leg edema and extreme fatigue since 06/06/2023.  Sonographer:    Carlos American RVT, RDCS (AE), RDMS Referring Phys: [id number] EMILY B MULLEN  Sonographer Comments: Suboptimal apical window. Image acquisition challenging due to respiratory motion. IMPRESSIONS  1. Left ventricular ejection fraction, by estimation, is 60 to 65%. The left ventricle has normal function. The left ventricle has no regional wall motion abnormalities. There is mild concentric left ventricular hypertrophy. Left ventricular diastolic parameters are consistent with Grade I diastolic dysfunction (impaired relaxation).  Abdomen Limited RUQ (LIVER/GB)  Result Date: 06/30/2023 CLINICAL DATA:  Elevated liver enzymes EXAM: ULTRASOUND ABDOMEN LIMITED RIGHT UPPER QUADRANT COMPARISON:  None Available. FINDINGS: Gallbladder: No gallstones or wall thickening visualized. No sonographic Murphy sign noted by sonographer. Common bile duct: Diameter: 0.2 cm Liver: Scattered mostly echogenic nodules are present throughout the liver. These were not present the CT of 10/30/2018. Some have a targetoid appearance as on image 37 of series 1. Appearance suspicious for diffuse hepatic metastatic disease. Portal vein is patent on color Doppler imaging with normal direction of blood flow towards the liver. Other: None. IMPRESSION: 1. Numerous echogenic nodules throughout the liver, suspicious for diffuse hepatic metastatic disease. Consider further workup such as CT of the chest, abdomen, pelvis with contrast for further characterization and to assess for potential primary. PET-CT could also be an alternative imaging choice or adjunct. The patient has a history of prostate cancer but this would be an unusual metastatic pathway for prostate cancer, and a gastrointestinal primary or liver primary would be more common. The possibility of macronodular cirrhosis is not entirely discarded but seems less likely given the appearance. Electronically Signed   By: Gaylyn Rong M.D.   On: 06/30/2023 13:45   ECHOCARDIOGRAM COMPLETE  Result Date: 06/30/2023    ECHOCARDIOGRAM REPORT   Patient Name:   William Jones Date of Exam: 06/30/2023 Medical Rec #:  [id number]             Height:       68.0 in Accession #:    [id number]            Weight:       175.7 lb Date of Birth:  1951/01/18              BSA:           1.934 m Patient Age:    72 years              BP:           146/71 mmHg Patient Gender: M                     HR:           71 bpm. Exam Location:  Inpatient Procedure: 2D Echo, Cardiac Doppler and Color Doppler Indications:    R06.9 DOE; R60.0 Lower extremity edema; R53.83 Fatigue  History:        Patient has no prior history of Echocardiogram examinations.                 Signs/Symptoms:Dyspnea, Edema and Fatigue; Risk                 Factors:Hypertension, Diabetes, Former Smoker, History of                 substance abuse. and Dyslipidemia. Patient denies chest pain. He                 does have DOE, leg edema and extreme fatigue since 06/06/2023.  Sonographer:    Carlos American RVT, RDCS (AE), RDMS Referring Phys: [id number] EMILY B MULLEN  Sonographer Comments: Suboptimal apical window. Image acquisition challenging due to respiratory motion. IMPRESSIONS  1. Left ventricular ejection fraction, by estimation, is 60 to 65%. The left ventricle has normal function. The left ventricle has no regional wall motion abnormalities. There is mild concentric left ventricular hypertrophy. Left ventricular diastolic parameters are consistent with Grade I diastolic dysfunction (impaired relaxation).  otherwise unremarkable. Other: Small fat containing umbilical hernia. Musculoskeletal: No lytic or blastic bone lesions. No acute bone abnormality. Osseous structures are age-appropriate. IMPRESSION: 1. Innumerable hypoenhancing masses throughout the liver, most in keeping with bilobar hepatic metastatic disease or a primary hepatic malignancy such as multifocal hepatocellular carcinoma. Correlation with serum alpha  fetoprotein level may be helpful. If indicated, widespread hepatic metastatic disease should be easily amenable to ultrasound-guided tissue sampling for further evaluation. 2. Bilateral adrenal metastases. 3. Mild coronary artery calcification. 4. Nonobstructing right renal calculus. Aortic Atherosclerosis (ICD10-I70.0). Electronically Signed   By: Helyn Numbers M.D.   On: 06/30/2023 23:17   MR BRAIN W WO CONTRAST  Result Date: 06/30/2023 CLINICAL DATA:  Metastatic disease evaluation. EXAM: MRI HEAD WITHOUT AND WITH CONTRAST TECHNIQUE: Multiplanar, multiecho pulse sequences of the brain and surrounding structures were obtained without and with intravenous contrast. CONTRAST:  8mL GADAVIST GADOBUTROL 1 MMOL/ML IV SOLN COMPARISON:  Head CT 02/12/2018. FINDINGS: Brain: No acute infarct or hemorrhage. No hydrocephalus or extra-axial collection. No foci of abnormal susceptibility. No mass or abnormal enhancement. Vascular: Normal flow voids and vessel enhancement. Skull and upper cervical spine: Normal marrow signal and enhancement. Sinuses/Orbits: Near-complete opacification of the frontal sinuses, maxillary sinuses and right sphenoid sinus. Orbits are unremarkable. Other: None. IMPRESSION: 1. No evidence of intracranial metastatic disease. 2. Near-complete opacification of the frontal sinuses, maxillary sinuses and right sphenoid sinus. Electronically Signed   By: Orvan Falconer M.D.   On: 06/30/2023 21:43   US Abdomen Limited RUQ (LIVER/GB)  Result Date: 06/30/2023 CLINICAL DATA:  Elevated liver enzymes EXAM: ULTRASOUND ABDOMEN LIMITED RIGHT UPPER QUADRANT COMPARISON:  None Available. FINDINGS: Gallbladder: No gallstones or wall thickening visualized. No sonographic Murphy sign noted by sonographer. Common bile duct: Diameter: 0.2 cm Liver: Scattered mostly echogenic nodules are present throughout the liver. These were not present the CT of 10/30/2018. Some have a targetoid appearance as on image 37 of  series 1. Appearance suspicious for diffuse hepatic metastatic disease. Portal vein is patent on color Doppler imaging with normal direction of blood flow towards the liver. Other: None. IMPRESSION: 1. Numerous echogenic nodules throughout the liver, suspicious for diffuse hepatic metastatic disease. Consider further workup such as CT of the chest, abdomen, pelvis with contrast for further characterization and to assess for potential primary. PET-CT could also be an alternative imaging choice or adjunct. The patient has a history of prostate cancer but this would be an unusual metastatic pathway for prostate cancer, and a gastrointestinal primary or liver primary would be more common. The possibility of macronodular cirrhosis is not entirely discarded but seems less likely given the appearance. Electronically Signed   By: Gaylyn Rong M.D.   On: 06/30/2023 13:45   ECHOCARDIOGRAM COMPLETE  Result Date: 06/30/2023    ECHOCARDIOGRAM REPORT   Patient Name:   William Jones Date of Exam: 06/30/2023 Medical Rec #:  [id number]             Height:       68.0 in Accession #:    [id number]            Weight:       175.7 lb Date of Birth:  Apr 11, 1951              BSA:          1.934 m Patient Age:    72 years              BP:  otherwise unremarkable. Other: Small fat containing umbilical hernia. Musculoskeletal: No lytic or blastic bone lesions. No acute bone abnormality. Osseous structures are age-appropriate. IMPRESSION: 1. Innumerable hypoenhancing masses throughout the liver, most in keeping with bilobar hepatic metastatic disease or a primary hepatic malignancy such as multifocal hepatocellular carcinoma. Correlation with serum alpha  fetoprotein level may be helpful. If indicated, widespread hepatic metastatic disease should be easily amenable to ultrasound-guided tissue sampling for further evaluation. 2. Bilateral adrenal metastases. 3. Mild coronary artery calcification. 4. Nonobstructing right renal calculus. Aortic Atherosclerosis (ICD10-I70.0). Electronically Signed   By: Helyn Numbers M.D.   On: 06/30/2023 23:17   MR BRAIN W WO CONTRAST  Result Date: 06/30/2023 CLINICAL DATA:  Metastatic disease evaluation. EXAM: MRI HEAD WITHOUT AND WITH CONTRAST TECHNIQUE: Multiplanar, multiecho pulse sequences of the brain and surrounding structures were obtained without and with intravenous contrast. CONTRAST:  8mL GADAVIST GADOBUTROL 1 MMOL/ML IV SOLN COMPARISON:  Head CT 02/12/2018. FINDINGS: Brain: No acute infarct or hemorrhage. No hydrocephalus or extra-axial collection. No foci of abnormal susceptibility. No mass or abnormal enhancement. Vascular: Normal flow voids and vessel enhancement. Skull and upper cervical spine: Normal marrow signal and enhancement. Sinuses/Orbits: Near-complete opacification of the frontal sinuses, maxillary sinuses and right sphenoid sinus. Orbits are unremarkable. Other: None. IMPRESSION: 1. No evidence of intracranial metastatic disease. 2. Near-complete opacification of the frontal sinuses, maxillary sinuses and right sphenoid sinus. Electronically Signed   By: Orvan Falconer M.D.   On: 06/30/2023 21:43   US Abdomen Limited RUQ (LIVER/GB)  Result Date: 06/30/2023 CLINICAL DATA:  Elevated liver enzymes EXAM: ULTRASOUND ABDOMEN LIMITED RIGHT UPPER QUADRANT COMPARISON:  None Available. FINDINGS: Gallbladder: No gallstones or wall thickening visualized. No sonographic Murphy sign noted by sonographer. Common bile duct: Diameter: 0.2 cm Liver: Scattered mostly echogenic nodules are present throughout the liver. These were not present the CT of 10/30/2018. Some have a targetoid appearance as on image 37 of  series 1. Appearance suspicious for diffuse hepatic metastatic disease. Portal vein is patent on color Doppler imaging with normal direction of blood flow towards the liver. Other: None. IMPRESSION: 1. Numerous echogenic nodules throughout the liver, suspicious for diffuse hepatic metastatic disease. Consider further workup such as CT of the chest, abdomen, pelvis with contrast for further characterization and to assess for potential primary. PET-CT could also be an alternative imaging choice or adjunct. The patient has a history of prostate cancer but this would be an unusual metastatic pathway for prostate cancer, and a gastrointestinal primary or liver primary would be more common. The possibility of macronodular cirrhosis is not entirely discarded but seems less likely given the appearance. Electronically Signed   By: Gaylyn Rong M.D.   On: 06/30/2023 13:45   ECHOCARDIOGRAM COMPLETE  Result Date: 06/30/2023    ECHOCARDIOGRAM REPORT   Patient Name:   William Jones Date of Exam: 06/30/2023 Medical Rec #:  [id number]             Height:       68.0 in Accession #:    [id number]            Weight:       175.7 lb Date of Birth:  Apr 11, 1951              BSA:          1.934 m Patient Age:    72 years              BP:  Abdomen Limited RUQ (LIVER/GB)  Result Date: 06/30/2023 CLINICAL DATA:  Elevated liver enzymes EXAM: ULTRASOUND ABDOMEN LIMITED RIGHT UPPER QUADRANT COMPARISON:  None Available. FINDINGS: Gallbladder: No gallstones or wall thickening visualized. No sonographic Murphy sign noted by sonographer. Common bile duct: Diameter: 0.2 cm Liver: Scattered mostly echogenic nodules are present throughout the liver. These were not present the CT of 10/30/2018. Some have a targetoid appearance as on image 37 of series 1. Appearance suspicious for diffuse hepatic metastatic disease. Portal vein is patent on color Doppler imaging with normal direction of blood flow towards the liver. Other: None. IMPRESSION: 1. Numerous echogenic nodules throughout the liver, suspicious for diffuse hepatic metastatic disease. Consider further workup such as CT of the chest, abdomen, pelvis with contrast for further characterization and to assess for potential primary. PET-CT could also be an alternative imaging choice or adjunct. The patient has a history of prostate cancer but this would be an unusual metastatic pathway for prostate cancer, and a gastrointestinal primary or liver primary would be more common. The possibility of macronodular cirrhosis is not entirely discarded but seems less likely given the appearance. Electronically Signed   By: Gaylyn Rong M.D.   On: 06/30/2023 13:45   ECHOCARDIOGRAM COMPLETE  Result Date: 06/30/2023    ECHOCARDIOGRAM REPORT   Patient Name:   William Jones Date of Exam: 06/30/2023 Medical Rec #:  [id number]             Height:       68.0 in Accession #:    [id number]            Weight:       175.7 lb Date of Birth:  1951/01/18              BSA:           1.934 m Patient Age:    72 years              BP:           146/71 mmHg Patient Gender: M                     HR:           71 bpm. Exam Location:  Inpatient Procedure: 2D Echo, Cardiac Doppler and Color Doppler Indications:    R06.9 DOE; R60.0 Lower extremity edema; R53.83 Fatigue  History:        Patient has no prior history of Echocardiogram examinations.                 Signs/Symptoms:Dyspnea, Edema and Fatigue; Risk                 Factors:Hypertension, Diabetes, Former Smoker, History of                 substance abuse. and Dyslipidemia. Patient denies chest pain. He                 does have DOE, leg edema and extreme fatigue since 06/06/2023.  Sonographer:    Carlos American RVT, RDCS (AE), RDMS Referring Phys: [id number] EMILY B MULLEN  Sonographer Comments: Suboptimal apical window. Image acquisition challenging due to respiratory motion. IMPRESSIONS  1. Left ventricular ejection fraction, by estimation, is 60 to 65%. The left ventricle has normal function. The left ventricle has no regional wall motion abnormalities. There is mild concentric left ventricular hypertrophy. Left ventricular diastolic parameters are consistent with Grade I diastolic dysfunction (impaired relaxation).  otherwise unremarkable. Other: Small fat containing umbilical hernia. Musculoskeletal: No lytic or blastic bone lesions. No acute bone abnormality. Osseous structures are age-appropriate. IMPRESSION: 1. Innumerable hypoenhancing masses throughout the liver, most in keeping with bilobar hepatic metastatic disease or a primary hepatic malignancy such as multifocal hepatocellular carcinoma. Correlation with serum alpha  fetoprotein level may be helpful. If indicated, widespread hepatic metastatic disease should be easily amenable to ultrasound-guided tissue sampling for further evaluation. 2. Bilateral adrenal metastases. 3. Mild coronary artery calcification. 4. Nonobstructing right renal calculus. Aortic Atherosclerosis (ICD10-I70.0). Electronically Signed   By: Helyn Numbers M.D.   On: 06/30/2023 23:17   MR BRAIN W WO CONTRAST  Result Date: 06/30/2023 CLINICAL DATA:  Metastatic disease evaluation. EXAM: MRI HEAD WITHOUT AND WITH CONTRAST TECHNIQUE: Multiplanar, multiecho pulse sequences of the brain and surrounding structures were obtained without and with intravenous contrast. CONTRAST:  8mL GADAVIST GADOBUTROL 1 MMOL/ML IV SOLN COMPARISON:  Head CT 02/12/2018. FINDINGS: Brain: No acute infarct or hemorrhage. No hydrocephalus or extra-axial collection. No foci of abnormal susceptibility. No mass or abnormal enhancement. Vascular: Normal flow voids and vessel enhancement. Skull and upper cervical spine: Normal marrow signal and enhancement. Sinuses/Orbits: Near-complete opacification of the frontal sinuses, maxillary sinuses and right sphenoid sinus. Orbits are unremarkable. Other: None. IMPRESSION: 1. No evidence of intracranial metastatic disease. 2. Near-complete opacification of the frontal sinuses, maxillary sinuses and right sphenoid sinus. Electronically Signed   By: Orvan Falconer M.D.   On: 06/30/2023 21:43   US Abdomen Limited RUQ (LIVER/GB)  Result Date: 06/30/2023 CLINICAL DATA:  Elevated liver enzymes EXAM: ULTRASOUND ABDOMEN LIMITED RIGHT UPPER QUADRANT COMPARISON:  None Available. FINDINGS: Gallbladder: No gallstones or wall thickening visualized. No sonographic Murphy sign noted by sonographer. Common bile duct: Diameter: 0.2 cm Liver: Scattered mostly echogenic nodules are present throughout the liver. These were not present the CT of 10/30/2018. Some have a targetoid appearance as on image 37 of  series 1. Appearance suspicious for diffuse hepatic metastatic disease. Portal vein is patent on color Doppler imaging with normal direction of blood flow towards the liver. Other: None. IMPRESSION: 1. Numerous echogenic nodules throughout the liver, suspicious for diffuse hepatic metastatic disease. Consider further workup such as CT of the chest, abdomen, pelvis with contrast for further characterization and to assess for potential primary. PET-CT could also be an alternative imaging choice or adjunct. The patient has a history of prostate cancer but this would be an unusual metastatic pathway for prostate cancer, and a gastrointestinal primary or liver primary would be more common. The possibility of macronodular cirrhosis is not entirely discarded but seems less likely given the appearance. Electronically Signed   By: Gaylyn Rong M.D.   On: 06/30/2023 13:45   ECHOCARDIOGRAM COMPLETE  Result Date: 06/30/2023    ECHOCARDIOGRAM REPORT   Patient Name:   William Jones Date of Exam: 06/30/2023 Medical Rec #:  [id number]             Height:       68.0 in Accession #:    [id number]            Weight:       175.7 lb Date of Birth:  Apr 11, 1951              BSA:          1.934 m Patient Age:    72 years              BP:  otherwise unremarkable. Other: Small fat containing umbilical hernia. Musculoskeletal: No lytic or blastic bone lesions. No acute bone abnormality. Osseous structures are age-appropriate. IMPRESSION: 1. Innumerable hypoenhancing masses throughout the liver, most in keeping with bilobar hepatic metastatic disease or a primary hepatic malignancy such as multifocal hepatocellular carcinoma. Correlation with serum alpha  fetoprotein level may be helpful. If indicated, widespread hepatic metastatic disease should be easily amenable to ultrasound-guided tissue sampling for further evaluation. 2. Bilateral adrenal metastases. 3. Mild coronary artery calcification. 4. Nonobstructing right renal calculus. Aortic Atherosclerosis (ICD10-I70.0). Electronically Signed   By: Helyn Numbers M.D.   On: 06/30/2023 23:17   MR BRAIN W WO CONTRAST  Result Date: 06/30/2023 CLINICAL DATA:  Metastatic disease evaluation. EXAM: MRI HEAD WITHOUT AND WITH CONTRAST TECHNIQUE: Multiplanar, multiecho pulse sequences of the brain and surrounding structures were obtained without and with intravenous contrast. CONTRAST:  8mL GADAVIST GADOBUTROL 1 MMOL/ML IV SOLN COMPARISON:  Head CT 02/12/2018. FINDINGS: Brain: No acute infarct or hemorrhage. No hydrocephalus or extra-axial collection. No foci of abnormal susceptibility. No mass or abnormal enhancement. Vascular: Normal flow voids and vessel enhancement. Skull and upper cervical spine: Normal marrow signal and enhancement. Sinuses/Orbits: Near-complete opacification of the frontal sinuses, maxillary sinuses and right sphenoid sinus. Orbits are unremarkable. Other: None. IMPRESSION: 1. No evidence of intracranial metastatic disease. 2. Near-complete opacification of the frontal sinuses, maxillary sinuses and right sphenoid sinus. Electronically Signed   By: Orvan Falconer M.D.   On: 06/30/2023 21:43   US Abdomen Limited RUQ (LIVER/GB)  Result Date: 06/30/2023 CLINICAL DATA:  Elevated liver enzymes EXAM: ULTRASOUND ABDOMEN LIMITED RIGHT UPPER QUADRANT COMPARISON:  None Available. FINDINGS: Gallbladder: No gallstones or wall thickening visualized. No sonographic Murphy sign noted by sonographer. Common bile duct: Diameter: 0.2 cm Liver: Scattered mostly echogenic nodules are present throughout the liver. These were not present the CT of 10/30/2018. Some have a targetoid appearance as on image 37 of  series 1. Appearance suspicious for diffuse hepatic metastatic disease. Portal vein is patent on color Doppler imaging with normal direction of blood flow towards the liver. Other: None. IMPRESSION: 1. Numerous echogenic nodules throughout the liver, suspicious for diffuse hepatic metastatic disease. Consider further workup such as CT of the chest, abdomen, pelvis with contrast for further characterization and to assess for potential primary. PET-CT could also be an alternative imaging choice or adjunct. The patient has a history of prostate cancer but this would be an unusual metastatic pathway for prostate cancer, and a gastrointestinal primary or liver primary would be more common. The possibility of macronodular cirrhosis is not entirely discarded but seems less likely given the appearance. Electronically Signed   By: Gaylyn Rong M.D.   On: 06/30/2023 13:45   ECHOCARDIOGRAM COMPLETE  Result Date: 06/30/2023    ECHOCARDIOGRAM REPORT   Patient Name:   William Jones Date of Exam: 06/30/2023 Medical Rec #:  [id number]             Height:       68.0 in Accession #:    [id number]            Weight:       175.7 lb Date of Birth:  Apr 11, 1951              BSA:          1.934 m Patient Age:    72 years              BP:  otherwise unremarkable. Other: Small fat containing umbilical hernia. Musculoskeletal: No lytic or blastic bone lesions. No acute bone abnormality. Osseous structures are age-appropriate. IMPRESSION: 1. Innumerable hypoenhancing masses throughout the liver, most in keeping with bilobar hepatic metastatic disease or a primary hepatic malignancy such as multifocal hepatocellular carcinoma. Correlation with serum alpha  fetoprotein level may be helpful. If indicated, widespread hepatic metastatic disease should be easily amenable to ultrasound-guided tissue sampling for further evaluation. 2. Bilateral adrenal metastases. 3. Mild coronary artery calcification. 4. Nonobstructing right renal calculus. Aortic Atherosclerosis (ICD10-I70.0). Electronically Signed   By: Helyn Numbers M.D.   On: 06/30/2023 23:17   MR BRAIN W WO CONTRAST  Result Date: 06/30/2023 CLINICAL DATA:  Metastatic disease evaluation. EXAM: MRI HEAD WITHOUT AND WITH CONTRAST TECHNIQUE: Multiplanar, multiecho pulse sequences of the brain and surrounding structures were obtained without and with intravenous contrast. CONTRAST:  8mL GADAVIST GADOBUTROL 1 MMOL/ML IV SOLN COMPARISON:  Head CT 02/12/2018. FINDINGS: Brain: No acute infarct or hemorrhage. No hydrocephalus or extra-axial collection. No foci of abnormal susceptibility. No mass or abnormal enhancement. Vascular: Normal flow voids and vessel enhancement. Skull and upper cervical spine: Normal marrow signal and enhancement. Sinuses/Orbits: Near-complete opacification of the frontal sinuses, maxillary sinuses and right sphenoid sinus. Orbits are unremarkable. Other: None. IMPRESSION: 1. No evidence of intracranial metastatic disease. 2. Near-complete opacification of the frontal sinuses, maxillary sinuses and right sphenoid sinus. Electronically Signed   By: Orvan Falconer M.D.   On: 06/30/2023 21:43   US Abdomen Limited RUQ (LIVER/GB)  Result Date: 06/30/2023 CLINICAL DATA:  Elevated liver enzymes EXAM: ULTRASOUND ABDOMEN LIMITED RIGHT UPPER QUADRANT COMPARISON:  None Available. FINDINGS: Gallbladder: No gallstones or wall thickening visualized. No sonographic Murphy sign noted by sonographer. Common bile duct: Diameter: 0.2 cm Liver: Scattered mostly echogenic nodules are present throughout the liver. These were not present the CT of 10/30/2018. Some have a targetoid appearance as on image 37 of  series 1. Appearance suspicious for diffuse hepatic metastatic disease. Portal vein is patent on color Doppler imaging with normal direction of blood flow towards the liver. Other: None. IMPRESSION: 1. Numerous echogenic nodules throughout the liver, suspicious for diffuse hepatic metastatic disease. Consider further workup such as CT of the chest, abdomen, pelvis with contrast for further characterization and to assess for potential primary. PET-CT could also be an alternative imaging choice or adjunct. The patient has a history of prostate cancer but this would be an unusual metastatic pathway for prostate cancer, and a gastrointestinal primary or liver primary would be more common. The possibility of macronodular cirrhosis is not entirely discarded but seems less likely given the appearance. Electronically Signed   By: Gaylyn Rong M.D.   On: 06/30/2023 13:45   ECHOCARDIOGRAM COMPLETE  Result Date: 06/30/2023    ECHOCARDIOGRAM REPORT   Patient Name:   William Jones Date of Exam: 06/30/2023 Medical Rec #:  [id number]             Height:       68.0 in Accession #:    [id number]            Weight:       175.7 lb Date of Birth:  Apr 11, 1951              BSA:          1.934 m Patient Age:    72 years              BP:  Abdomen Limited RUQ (LIVER/GB)  Result Date: 06/30/2023 CLINICAL DATA:  Elevated liver enzymes EXAM: ULTRASOUND ABDOMEN LIMITED RIGHT UPPER QUADRANT COMPARISON:  None Available. FINDINGS: Gallbladder: No gallstones or wall thickening visualized. No sonographic Murphy sign noted by sonographer. Common bile duct: Diameter: 0.2 cm Liver: Scattered mostly echogenic nodules are present throughout the liver. These were not present the CT of 10/30/2018. Some have a targetoid appearance as on image 37 of series 1. Appearance suspicious for diffuse hepatic metastatic disease. Portal vein is patent on color Doppler imaging with normal direction of blood flow towards the liver. Other: None. IMPRESSION: 1. Numerous echogenic nodules throughout the liver, suspicious for diffuse hepatic metastatic disease. Consider further workup such as CT of the chest, abdomen, pelvis with contrast for further characterization and to assess for potential primary. PET-CT could also be an alternative imaging choice or adjunct. The patient has a history of prostate cancer but this would be an unusual metastatic pathway for prostate cancer, and a gastrointestinal primary or liver primary would be more common. The possibility of macronodular cirrhosis is not entirely discarded but seems less likely given the appearance. Electronically Signed   By: Gaylyn Rong M.D.   On: 06/30/2023 13:45   ECHOCARDIOGRAM COMPLETE  Result Date: 06/30/2023    ECHOCARDIOGRAM REPORT   Patient Name:   William Jones Date of Exam: 06/30/2023 Medical Rec #:  [id number]             Height:       68.0 in Accession #:    [id number]            Weight:       175.7 lb Date of Birth:  1951/01/18              BSA:           1.934 m Patient Age:    72 years              BP:           146/71 mmHg Patient Gender: M                     HR:           71 bpm. Exam Location:  Inpatient Procedure: 2D Echo, Cardiac Doppler and Color Doppler Indications:    R06.9 DOE; R60.0 Lower extremity edema; R53.83 Fatigue  History:        Patient has no prior history of Echocardiogram examinations.                 Signs/Symptoms:Dyspnea, Edema and Fatigue; Risk                 Factors:Hypertension, Diabetes, Former Smoker, History of                 substance abuse. and Dyslipidemia. Patient denies chest pain. He                 does have DOE, leg edema and extreme fatigue since 06/06/2023.  Sonographer:    Carlos American RVT, RDCS (AE), RDMS Referring Phys: [id number] EMILY B MULLEN  Sonographer Comments: Suboptimal apical window. Image acquisition challenging due to respiratory motion. IMPRESSIONS  1. Left ventricular ejection fraction, by estimation, is 60 to 65%. The left ventricle has normal function. The left ventricle has no regional wall motion abnormalities. There is mild concentric left ventricular hypertrophy. Left ventricular diastolic parameters are consistent with Grade I diastolic dysfunction (impaired relaxation).  otherwise unremarkable. Other: Small fat containing umbilical hernia. Musculoskeletal: No lytic or blastic bone lesions. No acute bone abnormality. Osseous structures are age-appropriate. IMPRESSION: 1. Innumerable hypoenhancing masses throughout the liver, most in keeping with bilobar hepatic metastatic disease or a primary hepatic malignancy such as multifocal hepatocellular carcinoma. Correlation with serum alpha  fetoprotein level may be helpful. If indicated, widespread hepatic metastatic disease should be easily amenable to ultrasound-guided tissue sampling for further evaluation. 2. Bilateral adrenal metastases. 3. Mild coronary artery calcification. 4. Nonobstructing right renal calculus. Aortic Atherosclerosis (ICD10-I70.0). Electronically Signed   By: Helyn Numbers M.D.   On: 06/30/2023 23:17   MR BRAIN W WO CONTRAST  Result Date: 06/30/2023 CLINICAL DATA:  Metastatic disease evaluation. EXAM: MRI HEAD WITHOUT AND WITH CONTRAST TECHNIQUE: Multiplanar, multiecho pulse sequences of the brain and surrounding structures were obtained without and with intravenous contrast. CONTRAST:  8mL GADAVIST GADOBUTROL 1 MMOL/ML IV SOLN COMPARISON:  Head CT 02/12/2018. FINDINGS: Brain: No acute infarct or hemorrhage. No hydrocephalus or extra-axial collection. No foci of abnormal susceptibility. No mass or abnormal enhancement. Vascular: Normal flow voids and vessel enhancement. Skull and upper cervical spine: Normal marrow signal and enhancement. Sinuses/Orbits: Near-complete opacification of the frontal sinuses, maxillary sinuses and right sphenoid sinus. Orbits are unremarkable. Other: None. IMPRESSION: 1. No evidence of intracranial metastatic disease. 2. Near-complete opacification of the frontal sinuses, maxillary sinuses and right sphenoid sinus. Electronically Signed   By: Orvan Falconer M.D.   On: 06/30/2023 21:43   US Abdomen Limited RUQ (LIVER/GB)  Result Date: 06/30/2023 CLINICAL DATA:  Elevated liver enzymes EXAM: ULTRASOUND ABDOMEN LIMITED RIGHT UPPER QUADRANT COMPARISON:  None Available. FINDINGS: Gallbladder: No gallstones or wall thickening visualized. No sonographic Murphy sign noted by sonographer. Common bile duct: Diameter: 0.2 cm Liver: Scattered mostly echogenic nodules are present throughout the liver. These were not present the CT of 10/30/2018. Some have a targetoid appearance as on image 37 of  series 1. Appearance suspicious for diffuse hepatic metastatic disease. Portal vein is patent on color Doppler imaging with normal direction of blood flow towards the liver. Other: None. IMPRESSION: 1. Numerous echogenic nodules throughout the liver, suspicious for diffuse hepatic metastatic disease. Consider further workup such as CT of the chest, abdomen, pelvis with contrast for further characterization and to assess for potential primary. PET-CT could also be an alternative imaging choice or adjunct. The patient has a history of prostate cancer but this would be an unusual metastatic pathway for prostate cancer, and a gastrointestinal primary or liver primary would be more common. The possibility of macronodular cirrhosis is not entirely discarded but seems less likely given the appearance. Electronically Signed   By: Gaylyn Rong M.D.   On: 06/30/2023 13:45   ECHOCARDIOGRAM COMPLETE  Result Date: 06/30/2023    ECHOCARDIOGRAM REPORT   Patient Name:   William Jones Date of Exam: 06/30/2023 Medical Rec #:  [id number]             Height:       68.0 in Accession #:    [id number]            Weight:       175.7 lb Date of Birth:  Apr 11, 1951              BSA:          1.934 m Patient Age:    72 years              BP:

## 2023-07-27 NOTE — Progress Notes (Addendum)
NAME:  William Jones, MRN:  [id number], DOB:  1950/12/28, LOS: 15 ADMISSION DATE:  07/18/2023, CONSULTATION DATE:  10/8 REFERRING MD:  Dr. Maryfrances Bunnell, CHIEF COMPLAINT:  hypotension, lactic acidosis   History of Present Illness:  72 year old male with PMH as below, which is significant for prostate cancer, substance abuse, GI bleeding, HTN, and HLD. He takes Xtandi for metastatic prostate Ca. Recently admitted for weakness and was found to have diffuse hepatic metastatic disease and metastasis to the adrenal glands as well. 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 Primary vs mets uncertain. He was discharged and followed up with oncology 10/3 with plans to start carboplatin and etoposide 10/8, but unfortunately he once again became weak and presented to [facility] ED 10/6. He was concerned he would be unable to get the care he needed at home and would miss appointments due to feeling weak and was hopeful to be admitted to the hospital. He was admitted to the hospital for generalized weakness and possible UTI. He was seen by oncology inpatient and was scheduled to start inpatient chemotherapy, however, in the AM hours of 10/8 he became tachycardic and labs indicated worsening acidosis. Lactic acid was check and was found to be greater than 9. The patient was transferred to ICU for flower monitoring and PCCM was consulted. The patient was treated with IVF. BP and LA improved. PCCM signed off.   Subsequent course has been complicated by klebsiella UTI, anemia, and hypoxia. Anemia was evaluated by GI with upper and lower scopes. Lower scope found hemorrhoids but no clear evidence of bleeding. Hypoxia has been progressive. CXR was concerning for PNA vs edema and he has been treated with CAP antibiotics as well as diuresis, but hypoxia continued to worsen. PCCM called back 10/23 for hypoxia. DNR/DNI. Daughter updated.   Pertinent  Medical History   has a past medical  history of Angiodysplasia of colon with hemorrhage (10/14/2020), Duodenitis, Essential hypertension (07/16/2015), Fatigue (11/06/2020), Glaucoma, H/O: substance abuse (HCC), Hemorrhage of rectum and anus (10/14/2020), History of frostbite, History of prostate cancer (03/20/2014), Hyperlipidemia (11/20/2019), Iron deficiency anemia, Malignant tumor of prostate (HCC) (10/14/2020), Peripheral neuropathy, S/P radiation therapy (09/18/14-11/15/14), and Thrombocytopenia (HCC).   Significant Hospital Events: Including procedures, antibiotic start and stop dates in addition to other pertinent events   10/6 admit for generalized weakness 10/8 tx to ICU for LA 9 >  improved with volume 10/11 EGD > erosive gastropathy with no bleeding stigmata 10/18 colonoscopy > diverticulosis and external hemorrhoids, no bleeding.  10/23 PCCM consult for hypoxia  Interim History / Subjective:  C/o dyspnea, back discomfort.  Can't sleep, can't get comfortable Denies cough, fever, chills, and lower extremity edema. O2 sats drop when supine.   Objective   Blood pressure (!) 168/97, pulse (!) 102, temperature 98.8 F (37.1 C), temperature source Oral, resp. rate (!) 21, height 5\' 9"  (1.753 m), weight 65.7 kg, SpO2 (!) 84%.        Intake/Output Summary (Last 24 hours) at 07/27/2023 0805 Last data filed at 07/27/2023 0600 Gross per 24 hour  Intake 1637.54 ml  Output 2625 ml  Net -987.46 ml   Filed Weights   07/25/23 0613 07/26/23 0438 07/27/23 0500  Weight: 70.6 kg 65.9 kg 65.7 kg    Examination:  General: Adult male in mild respiratory distress on 12L [place] HENT: [place]/AT, PERRL, JVD halfway up the neck. Lungs: Coarse crackles throughout all posterior lung fields. Significant B lines on POCUS exam.  Cardiovascular: Tachy, regular, murmur Abdomen: Soft, NT, ND Extremities: No acute deformity Neuro: Slurring speech. Moving all extremities with good strength to command. No facial droop. Tongue midline.     Resolved Hospital Problem list   UTI - klebsiella oxytoca Lactic acidosis  Assessment & Plan:   Acute respiratory failure with hypoxia: workup ongoing. Seems like mostly pulmonary edema based on POCUS and CXR. But cannot rule out a retrocardiac opacity on CXR. He was also off VTE ppx for some time due to anemia and possible GIB. He is back on for the last 3 days.  - Now on BiPAP PRN and tolerating well - Keep O2 sats greater than 95% - Need to diurese, but need electrolytes replaced first.  - Broaden ABX for HAP coverage - CTA pending when he is more able to tolerate. Will send D-dimer in the mean time.  - We should repeat echocardiogram as well.   Hypokalemia Hypophosphatemia Hypomagnesemia Hypocalcemia - being replaced. Repeat labs this afternoon  Anemia: s/p EGD and colonoscopy with no evidence of bleeding. 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 Mr [place] says he is tired of suffering and does not want to add any additional interventions leading to more suffering. He would not want to be on the mechanical ventilator or undergo CPR should he worsens with clear understanding of the risks or refusal including death.   Best Practice (right click and "Reselect all SmartList Selections" daily)   Per primary  Labs   CBC: Recent Labs  Lab 07/23/23 0536 07/24/23 0528 07/24/23 1514 07/24/23 1659 07/25/23 0256  WBC 9.9 9.8 9.3 8.8 8.6  HGB 8.3* 8.7* 8.7* 8.5* 7.9*  HCT 24.5* 26.7* 26.5* 25.9* 24.7*  MCV 91.4 94.0 93.6 93.2 93.2  PLT 116* 116* 106* 117* 111*    Basic Metabolic  Panel: Recent Labs  Lab 07/24/23 0527 07/24/23 0528 07/25/23 0256 07/25/23 1403 07/26/23 0319 07/26/23 1741 07/27/23 0302  NA  --    < > 141 145 140 142 142  K  --    < > 2.7* 3.6 3.0* 3.9 3.0*  CL  --    < > 105 104 100 100 100  CO2  --    < > 27 27 27 30 31   GLUCOSE  --    < > 118* 121* 259* 223* 135*  BUN  --    < > 8 8 11 12 11   CREATININE  --    < > 0.37* 0.43* 0.45* 0.51* 0.41*  CALCIUM  --    < > 6.0* 7.1* 6.8* 7.2* 6.9*  MG  --    < > 1.8 2.0 1.6* 2.1 2.0  PHOS <1.0*  --  1.7* 2.0* 1.7*  --  2.1*   < > = values in this interval not displayed.   GFR: Estimated Creatinine Clearance: 79.8 mL/min (A) (by C-G formula based on SCr of 0.41 mg/dL (L)). Recent Labs  Lab 07/24/23 0528 07/24/23 1514 07/24/23 1659 07/24/23 1941 07/25/23 0256  WBC 9.8 9.3 8.8  --  8.6  LATICACIDVEN  --   --  2.3* 2.5*  --  Liver Function Tests: Recent Labs  Lab 07/23/23 1953 07/24/23 0528 07/25/23 0256 07/25/23 1403 07/26/23 0319  AST 47* 46* 46* 55* 39  ALT 121* 111* 80* 82* 64*  ALKPHOS 167* 177* 185* 245* 263*  BILITOT 0.6 0.7 0.6 0.9 0.9  PROT 4.9* 4.9* 5.4* 6.2* 5.1*  ALBUMIN 1.8* 1.8* 2.4* 2.5* 2.1*   No results for input(s): "LIPASE", "AMYLASE" in the last 168 hours. No results for input(s): "AMMONIA" in the last 168 hours.   ABG    Component Value Date/Time   PHART 7.51 (H) 07/24/2023 1435   PCO2ART 31 (L) 07/24/2023 1435   PO2ART 75 (L) 07/24/2023 1435   HCO3 24.5 07/24/2023 1435   TCO2 25 06/29/2023 2313   O2SAT 98.1 07/24/2023 1435     Coagulation Profile: No results for input(s): "INR", "PROTIME" in the last 168 hours.   Cardiac Enzymes: No results for input(s): "CKTOTAL", "CKMB", "CKMBINDEX", "TROPONINI" in the last 168 hours.   HbA1C: Hemoglobin A1C  Date/Time Value Ref Range Status  06/24/2023 08:55 AM 6.5 (A) 4.0 - 5.6 % Final  10/20/2022 10:41 AM 6.7 (A) 4.0 - 5.6 % Final    CBG: Recent Labs  Lab 07/26/23 0758 07/26/23 1226  07/26/23 1649 07/26/23 2249 07/27/23 0334  GLUCAP 189* 215* 188* 122* 130*    Review of Systems:     Past Medical History:  He,  has a past medical history of Angiodysplasia of colon with hemorrhage (10/14/2020), Duodenitis, Essential hypertension (07/16/2015), Fatigue (11/06/2020), Glaucoma, H/O: substance abuse (HCC), Hemorrhage of rectum and anus (10/14/2020), History of frostbite, History of prostate cancer (03/20/2014), Hyperlipidemia (11/20/2019), Iron deficiency anemia, Malignant tumor of prostate (HCC) (10/14/2020), Peripheral neuropathy, S/P radiation therapy (09/18/14-11/15/14), and Thrombocytopenia (HCC).   Surgical History:   Past Surgical History:  Procedure Laterality Date   BIOPSY  06/11/2023   Procedure: BIOPSY;  Surgeon: Benancio Deeds, MD;  Location: [facility] ENDOSCOPY;  Service: Gastroenterology;;   COLONOSCOPY N/A 01/10/2014   Procedure: COLONOSCOPY;  Surgeon: Theda Belfast, MD;  Location: [facility] ENDOSCOPY;  Service: Endoscopy;  Laterality: N/A;   COLONOSCOPY WITH PROPOFOL N/A 07/23/2023   Procedure: COLONOSCOPY WITH PROPOFOL;  Surgeon: Jeani Hawking, MD;  Location: [facility] ENDOSCOPY;  Service: Gastroenterology;  Laterality: N/A;   ESOPHAGOGASTRODUODENOSCOPY N/A 01/10/2014   Procedure: ESOPHAGOGASTRODUODENOSCOPY (EGD);  Surgeon: Theda Belfast, MD;  Location: [facility] ENDOSCOPY;  Service: Endoscopy;  Laterality: N/A;   ESOPHAGOGASTRODUODENOSCOPY (EGD) WITH PROPOFOL N/A 06/11/2023   Procedure: ESOPHAGOGASTRODUODENOSCOPY (EGD) WITH PROPOFOL;  Surgeon: Benancio Deeds, MD;  Location: [facility] ENDOSCOPY;  Service: Gastroenterology;  Laterality: N/A;   ESOPHAGOGASTRODUODENOSCOPY (EGD) WITH PROPOFOL N/A 2023/07/29   Procedure: ESOPHAGOGASTRODUODENOSCOPY (EGD) WITH PROPOFOL;  Surgeon: Jeani Hawking, MD;  Location: [facility] ENDOSCOPY;  Service: Gastroenterology;  Laterality: N/A;   FLEXIBLE SIGMOIDOSCOPY N/A 09/19/2015   Procedure: FLEXIBLE SIGMOIDOSCOPY;  Surgeon: Jeani Hawking, MD;  Location: [facility]  ENDOSCOPY;  Service: Endoscopy;  Laterality: N/A;   FLEXIBLE SIGMOIDOSCOPY N/A 06/03/2017   Procedure: FLEXIBLE SIGMOIDOSCOPY;  Surgeon: Jeani Hawking, MD;  Location: [facility] ENDOSCOPY;  Service: Endoscopy;  Laterality: N/A;   FLEXIBLE SIGMOIDOSCOPY N/A 01/06/2018   Procedure: FLEXIBLE SIGMOIDOSCOPY;  Surgeon: Jeani Hawking, MD;  Location: [facility] ENDOSCOPY;  Service: Endoscopy;  Laterality: N/A;   GIVENS CAPSULE STUDY N/A 01/10/2014   Procedure: GIVENS CAPSULE STUDY;  Surgeon: Theda Belfast, MD;  Location: [facility] ENDOSCOPY;  Service: Endoscopy;  Laterality: N/A;   HOT HEMOSTASIS N/A 09/19/2015   Procedure: HOT HEMOSTASIS (ARGON PLASMA COAGULATION/BICAP);  Surgeon: Jeani Hawking, MD;  Location:  [facility] ENDOSCOPY;  Service: Endoscopy;  Laterality: N/A;   HOT HEMOSTASIS N/A 06/03/2017   Procedure: HOT HEMOSTASIS (ARGON PLASMA COAGULATION/BICAP);  Surgeon: Jeani Hawking, MD;  Location: [facility] ENDOSCOPY;  Service: Endoscopy;  Laterality: N/A;   PROSTATE BIOPSY  03/05/14   Gleason 7, vol 45 gm     Social History:   reports that he quit smoking about 15 years ago. His smoking use included cigarettes. He started smoking about 45 years ago. He has a 15 pack-year smoking history. He has never used smokeless tobacco. He reports that he does not currently use alcohol. He reports that he does not use drugs.   Family History:  His family history includes Arthritis in his sister; Edema in his mother; Kidney failure (age of onset: 24) in his brother; Stroke in his maternal uncle. There is no history of Cancer.   Allergies Allergies  Allergen Reactions   Metformin And Related Other (See Comments)    Bad headaches     Home Medications  Prior to Admission medications   Medication Sig Start Date End Date Taking? Authorizing Provider  Calcium Carbonate (CALCIUM 600 PO) Take 600 mg by mouth daily.   Yes [provider]  Cholecalciferol (VITAMIN D3) 1000 units CAPS Take 1,000 Units by mouth daily.   Yes [provider]   dorzolamide (TRUSOPT) 2 % ophthalmic solution Place 1 drop into the right eye in the morning and at bedtime.   Yes [provider]  empagliflozin (JARDIANCE) 10 MG TABS tablet Take 1 tablet (10 mg total) by mouth daily before breakfast. 06/24/23  Yes Monna Fam, MD  ferrous sulfate 325 (65 FE) MG tablet Take 325 mg by mouth daily with breakfast.   Yes [provider]  hydrochlorothiazide (HYDRODIURIL) 12.5 MG tablet Take 1 tablet (12.5 mg total) by mouth daily. 07/06/23  Yes Morrie Sheldon, MD  losartan (COZAAR) 50 MG tablet Take 1 tablet (50 mg total) by mouth daily. 07/06/23  Yes Morrie Sheldon, MD  Multiple Vitamins-Minerals (ONE-A-DAY PROACTIVE 65+) TABS Take 1 tablet by mouth daily.   Yes [provider]  potassium chloride (MICRO-K) 10 MEQ CR capsule Take 2 capsules (20 mEq total) by mouth daily. Patient taking differently: Take 10 mEq by mouth in the morning and at bedtime. 07/08/23  Yes Rachel Moulds, MD  timolol (BETIMOL) 0.5 % ophthalmic solution Place 1 drop into the right eye in the morning and at bedtime.   Yes [provider]  triamcinolone (KENALOG) 0.025 % ointment Apply 1 Application topically 2 (two) times daily as needed (for rashes or itching).   Yes [provider]  vitamin E 180 MG (400 UNITS) capsule Take 400 Units by mouth daily.   Yes [provider]  pantoprazole (PROTONIX) 40 MG tablet Take 1 tablet (40 mg total) by mouth daily. Patient not taking: Reported on 07/21/2023 06/24/23 06/23/24  Monna Fam, MD     Critical care time: 39 minutes     Joneen Roach, AGACNP-BC [facility]  See Amion for personal pager PCCM on call pager [phone] until 7pm. Please call Elink 7p-7a. [phone]  07/27/2023 8:05 AM

## 2023-07-27 NOTE — Plan of Care (Signed)
Patient overall without significant change, reports feelings of general discomfort (not "pain"), breathing without distress, and no bleeding seen thus far tonight. Adjusted oxygen liter flow between 12-15 as needed to maintain saturations > 88 %.    Problem: Education: Goal: Ability to describe self-care measures that may prevent or decrease complications (Diabetes Survival Skills Education) will improve Outcome: Progressing Goal: Individualized Educational Video(s) Outcome: Progressing   Problem: Coping: Goal: Ability to adjust to condition or change in health will improve Outcome: Progressing   Problem: Fluid Volume: Goal: Ability to maintain a balanced intake and output will improve Outcome: Progressing   Problem: Health Behavior/Discharge Planning: Goal: Ability to identify and utilize available resources and services will improve Outcome: Progressing Goal: Ability to manage health-related needs will improve Outcome: Progressing   Problem: Metabolic: Goal: Ability to maintain appropriate glucose levels will improve Outcome: Progressing   Problem: Nutritional: Goal: Maintenance of adequate nutrition will improve Outcome: Progressing Goal: Progress toward achieving an optimal weight will improve Outcome: Progressing   Problem: Skin Integrity: Goal: Risk for impaired skin integrity will decrease Outcome: Progressing   Problem: Tissue Perfusion: Goal: Adequacy of tissue perfusion will improve Outcome: Progressing   Problem: Education: Goal: Knowledge of General Education information will improve Description: Including pain rating scale, medication(s)/side effects and non-pharmacologic comfort measures Outcome: Progressing   Problem: Health Behavior/Discharge Planning: Goal: Ability to manage health-related needs will improve Outcome: Progressing   Problem: Clinical Measurements: Goal: Ability to maintain clinical measurements within normal limits will  improve Outcome: Progressing Goal: Will remain free from infection Outcome: Progressing Goal: Diagnostic test results will improve Outcome: Progressing Goal: Respiratory complications will improve Outcome: Progressing Goal: Cardiovascular complication will be avoided Outcome: Progressing   Problem: Activity: Goal: Risk for activity intolerance will decrease Outcome: Progressing   Problem: Nutrition: Goal: Adequate nutrition will be maintained Outcome: Progressing   Problem: Coping: Goal: Level of anxiety will decrease Outcome: Progressing   Problem: Elimination: Goal: Will not experience complications related to bowel motility Outcome: Progressing Goal: Will not experience complications related to urinary retention Outcome: Progressing   Problem: Pain Managment: Goal: General experience of comfort will improve Outcome: Progressing   Problem: Safety: Goal: Ability to remain free from injury will improve Outcome: Progressing   Problem: Skin Integrity: Goal: Risk for impaired skin integrity will decrease Outcome: Progressing   Problem: Fluid Volume: Goal: Hemodynamic stability will improve Outcome: Progressing   Problem: Clinical Measurements: Goal: Diagnostic test results will improve Outcome: Progressing Goal: Signs and symptoms of infection will decrease Outcome: Progressing   Problem: Respiratory: Goal: Ability to maintain adequate ventilation will improve Outcome: Progressing   Problem: Education: Goal: Knowledge of the prescribed therapeutic regimen will improve Outcome: Progressing   Problem: Activity: Goal: Ability to implement measures to reduce episodes of fatigue will improve Outcome: Progressing   Problem: Bowel/Gastric: Goal: Will not experience complications related to bowel motility Outcome: Progressing   Problem: Coping: Goal: Ability to identify and develop effective coping behavior will improve Outcome: Progressing   Problem:  Nutritional: Goal: Maintenance of adequate nutrition will improve Outcome: Progressing

## 2023-07-27 NOTE — Progress Notes (Addendum)
PCCM INTERVAL PROGRESS NOTE  CTA chest reviewed. Official read pending.   No central PE. Severe diffuse interstitial prominence. Hopefully this is pulmonary edema as we have been treating, but seems like it is more likely an inflammatory process of some sort. Differential for an appearance like this is broad. He has not been responding well to continue diuresis. He would not tolerate bronchoscopy. Will offer a trial of systemic steroids to see how he responds.      Joneen Roach, AGACNP-BC [facility]  See Amion for personal pager PCCM on call pager [phone] until 7pm. Please call Elink 7p-7a. [phone]  07/27/2023 5:43 PM

## 2023-07-27 NOTE — Progress Notes (Signed)
[facility]                                                                              William Jones, is a 72 y.o. male, DOB - 1951/05/18, XLK:[id number] Admit date - 2023-07-16    Outpatient Primary MD for the patient is Morene Crocker, MD  LOS - 15  days  Chief Complaint  Patient presents with   Weakness       Brief summary   72 year old M with HTN, HLD, and prostate cancer, recently admitted for generalized weakness, found to have new metastatic neuroendocrine tumor to liver and adrenals.   Was discharged home with plans for outpatient chemo start, but became profoundly weak, returned to the hospital   In the ER, mild transaminitis due to tumor, mild hypokalemia, WBC normal, no fever.  UA with pyuria. 2023-07-16: Started on CTX, admitted for weakness, Xtandi held 10/7:  Oncology consulted, planned for inpatient chemo   Assessment & Plan    Principal Problem: Septic shock, lactic acidosis, UTI -Urine culture showed Klebsiella oxytoca.  Blood culture has been negative.   -Patient completed antibiotics -CT scan abdomen pelvis 10/8 that showed multifocal pneumonia, innumerable liver masses consistent with metastatic disease, multiple adrenal masses and ? Pancreatitis  Active Problems:   Acute respiratory failure with hypoxia (HCC) -At baseline, not on any O2 at home -On 10/20, patient was transferred to SDU due to markedly worsened respiratory status and increased O2 requirements. 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  Oncology had been following, Dr Al Pimple.   - Eventual d/c to SNF followed by continuation of chemo  Anemia: -Patient underwent EGD on 07/23/2023 -Colonoscopy  10/18 with findings of external hemorrhoids. Per GI, if bleeding occurs, ice pack can help and if bleeding persists, surgical eval may be required -f/u CBC      Essential hypertension -BP stable, continue metoprolol  Acute Transaminitis -Likely due to shock liver on presentation, improving    Prostate cancer metastatic to bone (HCC) -Xtandi is on hold for now while in hospital   Hypokalemia: -remains low, replaced   Hypophosphatemia: -Replaced with IV K-Phos   Hypomagnesemia: -Currently stable, 2.0   Hypocalcemia: -likely due to chronic illness   -improving, continue to replace     Moderate protein calorie malnutrition Nutrition Problem: Moderate Malnutrition Etiology: chronic illness, cancer and cancer related treatments Signs/Symptoms: mild fat depletion, moderate muscle depletion, percent weight loss Interventions: Ensure Enlive (each supplement provides 350kcal and 20 grams of protein), Education  Estimated body mass index is 21.39 kg/m as calculated from the following:   Height as of this encounter: 5\' 9"  (1.753 m).   Weight as of this encounter: 65.7 kg.  Code Status: full code DVT Prophylaxis:  enoxaparin (LOVENOX) injection 40 mg Start: 07/24/23 2200 Place and maintain sequential compression device Start: 07/14/23 1101   Level of Care: Level of care:  Stepdown Family Communication: Updated patient Disposition Plan:      Remains inpatient appropriate:      Procedures:    Consultants:   Pulm CCM  Antimicrobials:   Anti-infectives (From admission, onward)    Start     Dose/Rate Route Frequency Ordered Stop   07/24/23 1700  cefTRIAXone (ROCEPHIN) 2 g in sodium chloride 0.9 % 100 mL IVPB  Status:  Discontinued        2 g 200 mL/hr over 30 Minutes Intravenous Daily 07/24/23 1630 07/25/23 1453   07/24/23 1700  azithromycin (ZITHROMAX) 500 mg in sodium chloride 0.9 % 250 mL IVPB  Status:  Discontinued        500 mg 250 mL/hr over 60 Minutes Intravenous  Daily 07/24/23 1630 07/25/23 1453   07/17/23 1200  Ampicillin-Sulbactam (UNASYN) 3 g in sodium chloride 0.9 % 100 mL IVPB        3 g 200 mL/hr over 30 Minutes Intravenous Every 6 hours 07/17/23 1148 07/18/23 0049   07/14/23 1200  Ampicillin-Sulbactam (UNASYN) 3 g in sodium chloride 0.9 % 100 mL IVPB  Status:  Discontinued        3 g 200 mL/hr over 30 Minutes Intravenous Every 6 hours 07/14/23 1028 07/17/23 1148   07/13/23 0300  vancomycin (VANCOREADY) IVPB 750 mg/150 mL  Status:  Discontinued        750 mg 150 mL/hr over 60 Minutes Intravenous Every 12 hours 07/12/23 1403 07/14/23 0830   07/12/23 1430  vancomycin (VANCOREADY) IVPB 1500 mg/300 mL        1,500 mg 150 mL/hr over 120 Minutes Intravenous  Once 07/12/23 1402 07/12/23 1826   07/12/23 1200  meropenem (MERREM) 1 g in sodium chloride 0.9 % 100 mL IVPB  Status:  Discontinued        1 g 200 mL/hr over 30 Minutes Intravenous Every 8 hours 07/12/23 1102 07/14/23 1028   07/11/23 1800  cefTRIAXone (ROCEPHIN) 1 g in sodium chloride 0.9 % 100 mL IVPB  Status:  Discontinued        1 g 200 mL/hr over 30 Minutes Intravenous Every 24 hours 07/24/2023 1911 07/12/23 1102   07/23/2023 1800  cefTRIAXone (ROCEPHIN) 1 g in sodium chloride 0.9 % 100 mL IVPB        1 g 200 mL/hr over 30 Minutes Intravenous  Once 07/29/2023 1745 07/28/2023 2132          Medications  calcium carbonate  1 tablet Oral BID   Chlorhexidine Gluconate Cloth  6 each Topical Q2200   docusate sodium  100 mg Oral BID   dorzolamide  1 drop Right Eye BID   enoxaparin (LOVENOX) injection  40 mg Subcutaneous Q24H   feeding supplement  1 Container Oral TID BM   furosemide  60 mg Intravenous BID   insulin aspart  0-15 Units Subcutaneous TID WC   insulin aspart  0-5 Units Subcutaneous QHS   insulin glargine-yfgn  8 Units Subcutaneous Daily   magnesium oxide  400 mg Oral BID   metoprolol tartrate  75 mg Oral BID   pantoprazole  40 mg Oral BID AC   phosphorus  500 mg Oral BID    potassium chloride  40 mEq Oral BID   sodium chloride flush  3 mL Intravenous Q12H   timolol  1 drop Right Eye BID      Subjective:   William Jones was seen and examined today.  Overnight rapid response, O2 sats down to 84% this a.m.,  placed on 15 L O2 via HFNC, O2 sats in 90s.   Able to complete sentences, denies any chest pain, abdominal pain, nausea or vomiting.   Objective:   Vitals:   07/27/23 0300 07/27/23 0400 07/27/23 0500 07/27/23 0600  BP: (!) 154/79 130/70 (!) 147/69 (!) 168/97  Pulse: 87 84 77 (!) 102  Resp: (!) 21 (!) 22 (!) 23 (!) 21  Temp:  98.8 F (37.1 C)    TempSrc:  Oral    SpO2: 94% 94% 97% (!) 84%  Weight:   65.7 kg   Height:        Intake/Output Summary (Last 24 hours) at 07/27/2023 0756 Last data filed at 07/27/2023 0600 Gross per 24 hour  Intake 1637.54 ml  Output 3525 ml  Net -1887.46 ml     Wt Readings from Last 3 Encounters:  07/27/23 65.7 kg  07/07/23 68.3 kg  07/05/23 71.1 kg     Exam General: Alert and oriented x 3, NAD Cardiovascular: S1 S2 auscultated,  RRR Respiratory: Diminished breath sound at the bases, no acute wheezing Gastrointestinal: Soft, nontender, nondistended, + bowel sounds Ext: no pedal edema bilaterally Neuro: Strength 5/5 upper and lower extremities bilaterally Skin: No rashes Psych: Normal affect     Data Reviewed:  I have personally reviewed following labs    CBC Lab Results  Component Value Date   WBC 8.6 07/25/2023   RBC 2.65 (L) 07/25/2023   HGB 7.9 (L) 07/25/2023   HCT 24.7 (L) 07/25/2023   MCV 93.2 07/25/2023   MCH 29.8 07/25/2023   PLT 111 (L) 07/25/2023   MCHC 32.0 07/25/2023   RDW 17.2 (H) 07/25/2023   LYMPHSABS 0.5 (L) 07/20/2023   MONOABS 0.3 07/20/2023   EOSABS 0.0 07/20/2023   BASOSABS 0.0 07/20/2023     Last metabolic panel Lab Results  Component Value Date   NA 142 07/27/2023   K 3.0 (L) 07/27/2023   CL 100 07/27/2023   CO2 31 07/27/2023   BUN 11 07/27/2023    CREATININE 0.41 (L) 07/27/2023   GLUCOSE 135 (H) 07/27/2023   GFRNONAA >60 07/27/2023   GFRAA 103 12/18/2019   CALCIUM 6.9 (L) 07/27/2023   PHOS 2.1 (L) 07/27/2023   PROT 5.1 (L) 07/26/2023   ALBUMIN 2.1 (L) 07/26/2023   LABGLOB 2.4 04/10/2019   AGRATIO 1.6 04/10/2019   BILITOT 0.9 07/26/2023   ALKPHOS 263 (H) 07/26/2023   AST 39 07/26/2023   ALT 64 (H) 07/26/2023   ANIONGAP 11 07/27/2023    CBG (last 3)  Recent Labs    07/26/23 1649 07/26/23 2249 07/27/23 0334  GLUCAP 188* 122* 130*      Coagulation Profile: No results for input(s): "INR", "PROTIME" in the last 168 hours.   Radiology Studies: I have personally reviewed the imaging studies  No results found.     Thad Ranger M.D. [facility] 07/27/2023, 7:56 AM  Available via Epic secure chat 7am-7pm After 7 pm, please refer to night coverage provider listed on amion.

## 2023-07-27 NOTE — Progress Notes (Signed)
Physical Therapy Discharge Patient Details Name: William Jones MRN: [id number] DOB: 01/07/1951 Today's Date: 07/27/2023 Time:  -     Patient discharged from PT services secondary to medical decline - will need to re-order PT to resume therapy services.Per RN, patient back on BiPAP.  Please see latest therapy progress note for current level of functioning and progress toward goals.    Progress and discharge plan discussed with patient and/or caregiver: N/A  GP   Blanchard Kelch PT Acute Rehabilitation Services Office [phone] Weekend [phone]   [name] 07/27/2023, 11:19 AM

## 2023-07-27 NOTE — Progress Notes (Signed)
Pharmacy Electrolyte Replacement  Recent Labs:  Recent Labs    07/27/23 0302  K 3.0*  MG 2.0  PHOS 2.1*  CREATININE 0.41*   Patient diuresing extensively on BID IV Lasix since 10/20 for fluid overload and increased WOB. with great difficulty keeping up with electrolytes. Asked by Dr. Rhona Leavens 10/22 for pharmacy to help with monitoring and electrolyte supplementation. - We are also contributing to fluid overload with all the electrolyte supplementation bags of fluid.  UOP 10/20: 4225 UOP 10/21: 2550 UOP:10/22: 3525 ml  Plan:  Regular diet so will try to utilize some oral supplementation  - K 3:  Iv x 5, Kdur 40 BID scheduled   - CoCa (albumin 2.1):  8.42: Repeat CaGluconate 2g IV x 1 and Con't Oscal (500mg  elemental Ca) po BID   - Phos 2.1: Kphos IV x 1 (over 6 hrs), con't Kphos Neutral 500mg  po BID tonight  - Mg 2.: con't Magox 400mg  BID   - Recheck K+ this evening and supplement if needed - BMET, Mg, Phos daily - When IV Lasix stops, we will have to be vigilant to decrease/stop all these supplements.   Edrian Melucci S. Merilynn Finland, PharmD, BCPS Clinical Staff Pharmacist [email or web address]

## 2023-07-28 ENCOUNTER — Inpatient Hospital Stay (HOSPITAL_COMMUNITY): Payer: 59

## 2023-07-28 DIAGNOSIS — N3000 Acute cystitis without hematuria: Principal | ICD-10-CM

## 2023-07-28 DIAGNOSIS — J810 Acute pulmonary edema: Secondary | ICD-10-CM | POA: Diagnosis not present

## 2023-07-28 DIAGNOSIS — C801 Malignant (primary) neoplasm, unspecified: Secondary | ICD-10-CM | POA: Diagnosis not present

## 2023-07-28 DIAGNOSIS — J9601 Acute respiratory failure with hypoxia: Secondary | ICD-10-CM

## 2023-07-28 DIAGNOSIS — Z515 Encounter for palliative care: Secondary | ICD-10-CM

## 2023-07-28 DIAGNOSIS — R0609 Other forms of dyspnea: Secondary | ICD-10-CM | POA: Diagnosis not present

## 2023-07-28 DIAGNOSIS — R4589 Other symptoms and signs involving emotional state: Secondary | ICD-10-CM

## 2023-07-28 DIAGNOSIS — R5383 Other fatigue: Secondary | ICD-10-CM | POA: Diagnosis not present

## 2023-07-28 DIAGNOSIS — R579 Shock, unspecified: Secondary | ICD-10-CM | POA: Diagnosis not present

## 2023-07-28 DIAGNOSIS — R531 Weakness: Secondary | ICD-10-CM | POA: Diagnosis not present

## 2023-07-28 DIAGNOSIS — Z7189 Other specified counseling: Secondary | ICD-10-CM

## 2023-07-28 LAB — GLUCOSE, CAPILLARY
Glucose-Capillary: 102 mg/dL — ABNORMAL HIGH (ref 70–99)
Glucose-Capillary: 112 mg/dL — ABNORMAL HIGH (ref 70–99)
Glucose-Capillary: 307 mg/dL — ABNORMAL HIGH (ref 70–99)
Glucose-Capillary: 246 mg/dL — ABNORMAL HIGH (ref 70–99)
Glucose-Capillary: 164 mg/dL — ABNORMAL HIGH (ref 70–99)

## 2023-07-28 LAB — CBC
HCT: 30.3 % — ABNORMAL LOW (ref 39.0–52.0)
Hemoglobin: 9.5 g/dL — ABNORMAL LOW (ref 13.0–17.0)
MCH: 30.1 pg (ref 26.0–34.0)
MCHC: 31.4 g/dL (ref 30.0–36.0)
MCV: 95.9 fL (ref 80.0–100.0)
Platelets: 20 10*3/uL — CL (ref 150–400)
RBC: 3.16 MIL/uL — ABNORMAL LOW (ref 4.22–5.81)
RDW: 17.1 % — ABNORMAL HIGH (ref 11.5–15.5)
WBC: 8.8 10*3/uL (ref 4.0–10.5)
nRBC: 0.8 % — ABNORMAL HIGH (ref 0.0–0.2)

## 2023-07-28 LAB — ECHOCARDIOGRAM COMPLETE
Est EF: >75
Height: 69 in
S' Lateral: 2 cm
Weight: 2194.02 [oz_av]

## 2023-07-28 LAB — BASIC METABOLIC PANEL
Anion gap: 12 (ref 5–15)
BUN: 18 mg/dL (ref 8–23)
CO2: 30 mmol/L (ref 22–32)
Calcium: 7 mg/dL — ABNORMAL LOW (ref 8.9–10.3)
Chloride: 104 mmol/L (ref 98–111)
Creatinine, Ser: 0.36 mg/dL — ABNORMAL LOW (ref 0.61–1.24)
GFR, Estimated: >60 mL/min (ref >60)
Glucose, Bld: 115 mg/dL — ABNORMAL HIGH (ref 70–99)
Potassium: 4.5 mmol/L (ref 3.5–5.1)
Sodium: 146 mmol/L — ABNORMAL HIGH (ref 135–145)

## 2023-07-28 LAB — MAGNESIUM: Magnesium: 1.7 mg/dL (ref 1.7–2.4)

## 2023-07-28 LAB — PHOSPHORUS: Phosphorus: 1.7 mg/dL — ABNORMAL LOW (ref 2.5–4.6)

## 2023-07-28 LAB — STREP PNEUMONIAE URINARY ANTIGEN: Strep Pneumo Urinary Antigen: NEGATIVE

## 2023-07-28 LAB — MRSA NEXT GEN BY PCR, NASAL: MRSA by PCR Next Gen: NOT DETECTED

## 2023-07-28 MED ORDER — SODIUM CHLORIDE 0.9 % IV SOLN
2.0000 g | Freq: Three times a day (TID) | INTRAVENOUS | Status: DC
  Administered 2023-07-28 – 2023-07-29 (×3): 2 g via INTRAVENOUS
  Filled 2023-07-28 (×5): qty 12.5

## 2023-07-28 MED ORDER — K PHOS MONO-SOD PHOS DI & MONO 155-852-130 MG PO TABS
500.0000 mg | ORAL_TABLET | Freq: Four times a day (QID) | ORAL | Status: DC
  Administered 2023-07-28 (×3): 500 mg via ORAL
  Filled 2023-07-28 (×4): qty 2

## 2023-07-28 MED ORDER — CARMEX CLASSIC LIP BALM EX OINT
TOPICAL_OINTMENT | CUTANEOUS | Status: DC | PRN
  Filled 2023-07-28 (×2): qty 10

## 2023-07-28 MED ORDER — VANCOMYCIN HCL 750 MG/150ML IV SOLN
750.0000 mg | Freq: Two times a day (BID) | INTRAVENOUS | Status: DC
  Administered 2023-07-29: 750 mg via INTRAVENOUS
  Filled 2023-07-28: qty 150

## 2023-07-28 MED ORDER — MAGNESIUM SULFATE 2 GM/50ML IV SOLN
2.0000 g | Freq: Once | INTRAVENOUS | Status: AC
  Administered 2023-07-28: 2 g via INTRAVENOUS
  Filled 2023-07-28: qty 50

## 2023-07-28 MED ORDER — CALCIUM GLUCONATE-NACL 2-0.675 GM/100ML-% IV SOLN
2.0000 g | Freq: Once | INTRAVENOUS | Status: AC
  Administered 2023-07-28: 2000 mg via INTRAVENOUS
  Filled 2023-07-28: qty 100

## 2023-07-28 MED ORDER — POTASSIUM PHOSPHATES 15 MMOLE/5ML IV SOLN
30.0000 mmol | Freq: Once | INTRAVENOUS | Status: AC
  Administered 2023-07-28: 30 mmol via INTRAVENOUS
  Filled 2023-07-28: qty 10

## 2023-07-28 NOTE — Progress Notes (Signed)
Pharmacy Antibiotic Note  William Jones is a 72 y.o. male admitted on 07/10/2023 with pneumonia.  Pharmacy has been consulted for Vanco, Cefepime dosing, resuming abx empirically for HCAP with difficulty breathing.  Plan: Continue Cefepime 2g IV q8h Change to Vancomycin 750 mg IV q12h  (SCr rounded to 0.8, TBW < IBW, Est AUC 499) Measure Vanc levels as needed.  Goal AUC = 400 - 550 Follow up renal function, culture results, and clinical course.    Height: 5\' 9"  (175.3 cm) Weight: 62.2 kg (137 lb 2 oz) IBW/kg (Calculated) : 70.7  Temp (24hrs), Avg:98.1 F (36.7 C), Min:97.6 F (36.4 C), Max:98.4 F (36.9 C)  Recent Labs  Lab 07/24/23 1514 07/24/23 1659 07/24/23 1941 07/25/23 0256 07/25/23 1403 07/26/23 0319 07/26/23 1741 07/27/23 0302 07/27/23 1008 07/27/23 1729 07/28/23 0854  WBC 9.3 8.8  --  8.6  --   --   --   --  7.6  --  8.8  CREATININE 0.43*  --  0.47* 0.37*   < > 0.45* 0.51* 0.41*  --  0.45* 0.36*  LATICACIDVEN  --  2.3* 2.5*  --   --   --   --   --   --   --   --    < > = values in this interval not displayed.    Estimated Creatinine Clearance: 75.6 mL/min (A) (by C-G formula based on SCr of 0.36 mg/dL (L)).    Allergies  Allergen Reactions   Metformin And Related Other (See Comments)    Bad headaches   Antimicrobials this admission:  10/6 CTX >> 10/8 meropenem >> 10/9, 10/20 x 5d 10/20: Azithro x 5d 10/10 Unasyn >> 10/13 10/8 vanc >> 10/10, 10/23>> 10/23 Cefepime>>  Dose adjustments this admission:   Microbiology results:  10/6 ucx: >100K kleb oxytoca (R-Amp; no CLSI brkpoint for Ancef) 10/8 bcx x2: neg FINAL  10/8 MRSA PCR: neg 10/24 MRSA PCR:     Lynann Beaver PharmD, BCPS [facility] main pharmacy [phone] 07/28/2023 1:05 PM

## 2023-07-28 NOTE — Progress Notes (Signed)
NAME:  William Jones, MRN:  [id number], DOB:  [birth year], LOS: 16 ADMISSION DATE:  07/08/2023, CONSULTATION DATE:  10/8 REFERRING MD:  Dr. Maryfrances Bunnell, CHIEF COMPLAINT:  hypotension, lactic acidosis   History of Present Illness:  72 year old male with PMH as below, which is significant for prostate cancer, substance abuse, GI bleeding, HTN, and HLD. He takes Xtandi for metastatic prostate Ca. Recently admitted for weakness and was found to have diffuse hepatic metastatic disease and metastasis to the adrenal glands as well. 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 Primary vs mets uncertain. He was discharged and followed up with oncology 10/3 with plans to start carboplatin and etoposide 10/8, but unfortunately he once again became weak and presented to [facility] ED 07/25/2023. He was concerned he would be unable to get the care he needed at home and would miss appointments due to feeling weak and was hopeful to be admitted to the hospital. He was admitted to the hospital for generalized weakness and possible UTI. He was seen by oncology inpatient and was scheduled to start inpatient chemotherapy, however, in the AM hours of 10/8 he became tachycardic and labs indicated worsening acidosis. Lactic acid was check and was found to be greater than 9. The patient was transferred to ICU for flower monitoring and PCCM was consulted. The patient was treated with IVF. BP and LA improved. PCCM signed off.   Subsequent course has been complicated by klebsiella UTI, anemia, and hypoxia. Anemia was evaluated by GI with upper and lower scopes. Lower scope found hemorrhoids but no clear evidence of bleeding. Hypoxia has been progressive. CXR was concerning for PNA vs edema and he has been treated with CAP antibiotics as well as diuresis, but hypoxia continued to worsen. PCCM called back 10/23 for hypoxia. DNR/DNI. Daughter updated.   Pertinent  Medical History   has a past medical  history of Angiodysplasia of colon with hemorrhage (10/14/2020), Duodenitis, Essential hypertension (07/16/2015), Fatigue (11/06/2020), Glaucoma, H/O: substance abuse (HCC), Hemorrhage of rectum and anus (10/14/2020), History of frostbite, History of prostate cancer (03/20/2014), Hyperlipidemia (11/20/2019), Iron deficiency anemia, Malignant tumor of prostate (HCC) (10/14/2020), Peripheral neuropathy, S/P radiation therapy (09/18/14-11/15/14), and Thrombocytopenia (HCC).   Significant Hospital Events: Including procedures, antibiotic start and stop dates in addition to other pertinent events   July 22, 2023 admit for generalized weakness 10/8 tx to ICU for LA 9 >  improved with volume 10/11 EGD > erosive gastropathy with no bleeding stigmata 10/18 colonoscopy > diverticulosis and external hemorrhoids, no bleeding.  10/23 PCCM consult for hypoxia > CT no obvious PE. Diffuse airspace opacities concerning for edema or infection.   Interim History / Subjective:  Breathing a little better this morning, on 12L [place] and 15L nRB.  BiPAP intermittently overnight.  Diuresed 2.8L yesterday for 1.4L negative.  Afebrile   Objective   Blood pressure 122/69, pulse 88, temperature 97.6 F (36.4 C), temperature source Oral, resp. rate 17, height 5\' 9"  (1.753 m), weight 62.2 kg, SpO2 98%.    FiO2 (%):  [40 %-100 %] 60 %   Intake/Output Summary (Last 24 hours) at 07/28/2023 0742 Last data filed at 07/28/2023 0600 Gross per 24 hour  Intake 1441.3 ml  Output 2850 ml  Net -1408.7 ml   Filed Weights   07/26/23 0438 07/27/23 0500 07/28/23 0500  Weight: 65.9 kg 65.7 kg 62.2 kg    Examination:  General: Adult male in no acute distress HENT: [place]/AT, PERRL, no JVD Lungs: Bibasilar  crackles  Cardiovascular: Tachy, regular, murmur Abdomen: Soft, NT, ND Extremities: No acute deformity Neuro: Slurring speech. Moving all extremities with good strength to command. No facial droop. Tongue midline.    Resolved Hospital  Problem list   UTI - klebsiella oxytoca Lactic acidosis  Assessment & Plan:   Acute respiratory failure with hypoxia: Most consistent with pulmonary edema, but not really improving with diuresis. CTA negative for PE. Diffuse opacities.  - Now on BiPAP PRN and tolerating well - Keep O2 sats greater than 95% - Continue to diurese  - HAP coverage - Steroids started 10/23 - Repeat echo pending - DNR/DNI  Hypokalemia Hypophosphatemia Hypomagnesemia Hypocalcemia - being replaced. Repeat labs this afternoon  Anemia: s/p EGD and colonoscopy with no evidence of bleeding. Erosive gastropathy and external hemorrhoids  - CBC pending  Neuroendocrine - Small cell carcinoma of liver, adrenals:  - Management per oncology, primary - Trend LF - Plan was for inpatient chemo, but he has been too sick unfortunately. Oncology now recommending palliation.   DM - management per primary  Prostate cancer (adeno) with bone mets - management per oncology - Holding home Su Monks, Lupron  Goals of care addressed with multiple providers yesterday including the palliative medicine team. He does not want to suffer (DNR/DNI), but does want to get better. Will focus on trying to improve his respiratory failure as above.   Best Practice (right click and "Reselect all SmartList Selections" daily)   Per primary   Critical care time:      Joneen Roach, AGACNP-BC [facility]  See Amion for personal pager PCCM on call pager [phone] until 7pm. Please call Elink 7p-7a. [phone]  07/28/2023 7:42 AM

## 2023-07-28 NOTE — Inpatient Diabetes Management (Signed)
Inpatient Diabetes Program Recommendations  AACE/ADA: New Consensus Statement on Inpatient Glycemic Control (2015)  Target Ranges:  Prepandial:   less than 140 mg/dL      Peak postprandial:   less than 180 mg/dL (1-2 hours)      Critically ill patients:  140 - 180 mg/dL   Lab Results  Component Value Date   GLUCAP 307 (H) 07/28/2023   HGBA1C 6.5 (A) 06/24/2023    Review of Glycemic Control  Latest Reference Range & Units 07/27/23 08:17 07/27/23 11:59 07/27/23 16:20 07/27/23 21:24 07/28/23 03:30 07/28/23 08:16 07/28/23 11:55  Glucose-Capillary 70 - 99 mg/dL 161 (H) 096 (H) 045 (H) 80 102 (H) 112 (H) 307 (H)   Diabetes history: DM 2 Outpatient Diabetes medications: Jardiance 10 mg Daily Current orders for Inpatient glycemic control:  Semglee 8 units Daily Novolog 0-15 units tid + hs  Boost Breeze tid between meals Solumedrol 60 mg Q12 hours started on 10/23 at 1743 pm  Inpatient Diabetes Program Recommendations:    -   Add Novolog 4 units tid meal coverage while on solumedrol  Thanks,  Christena Deem RN, MSN, BC-ADM Inpatient Diabetes Coordinator Team Pager [phone] (8a-5p)

## 2023-07-28 NOTE — Progress Notes (Signed)
   07/28/23 0757  Oxygen Therapy/Pulse Ox  O2 Device HFNC;Non-rebreather Mask  O2 Therapy Oxygen humidified (Refilled H20 bottle, safety valve checked.)  O2 Flow Rate (L/min) (S)  15 L/min (Weaned to HFNC as 10 L, as tolerated.)  FiO2 (%) 100 %  SpO2 100 %  Safety Instructions Yes (Comment)

## 2023-07-28 NOTE — Progress Notes (Signed)
Daily Progress Note   Patient Name: William Jones       Date: 07/28/2023 DOB: 1951/06/23  Age: 72 y.o. MRN#: [id number] Attending Physician: Cathren Harsh, MD Primary Care Physician: Morene Crocker, MD Admit Date: 07/17/2023 Length of Stay: 16 days  Reason for Consultation/Follow-up: Establishing goals of care  Subjective:   CC: Patient notes feeling his breathing was "improving". Following up regarding complex medical decision making.   Subjective:  Reviewed EMR prior to presenting to bedside.  Discussed care with hospitalist, oncologist, and RN for medical updates.  Presented to bedside to meet with patient.  Patient laying in bed on high flow nasal cannula and nonrebreather.  Patient welcoming conversation at that time.  No family present at bedside.  During conversation patient was able to call his daughter and put her on speaker phone so we could all talk regarding medical updates.  Again introduced myself as a member of the palliative medicine team.  Updated patient and daughter regarding medical management today.  Again discussed patient is continuing with aggressive, appropriate medical interventions as per his wishes regarding medical care.  Did explain that patient's platelet count has lowered which is worrisome .  Spent time explaining that patient is not receiving blood thinner due to his low platelet count.  Patient does not have active bleeding seen and team is closely watching his hemoglobin which is maintaining at this time.  Discussed in the situation after talking with hospitalist, PCCM, and oncology, feel the risk of transfusion platelets and the patient without an active bleed who is immunocompromised has greater risks than benefits.  Noted we will continue to monitor patient for appropriate interventions as needed.  Did again expressed concern that patient is requiring high level of oxygen support which has not been able to wean down.  Again addressed patient  is receiving appropriate medical therapies for this including antibiotics, steroids, and Lasix for fluid removal.  All questions answered at that time.  Noted to be coming to bedside shortly.  After getting off the phone with daughter, patient noted that he was having worsening shortness of breath.  Patient specifically asked for the IV Dilaudid to help with his breathing.  Noted this medication is only as needed and so patient can ask for it when he needs it.  Patient voiced appreciation for that and wanted to receive medication at this time.  Noted would inform his RN.  Informed shortly after visit the patient's daughter had presented to bedside had further questions.  Able to review present for discussions with daughter and her friend.  Asked patient's permission as daughter and her friend as they wanted to speak outside of patient's room.  Patient granting permission for this.  Again spent time reviewing risk versus benefits and why patient is not receiving active platelet transfusions at this time.  Discussed continuing to monitor for signs of bleeding and patient is not receiving anticoagulation as do not want to increase his risk of bleeding.  Again explained that patient is receiving antibiotics, Lasix, and steroids to assist with breathing management.  Continuing appropriate oxygen therapies at this time.  Spent time answering questions as able regarding this.  Did again discussed that as per oncologist, patient is not candidate for further chemotherapy which daughter and friend acknowledges.  They were inquiring about use of Dilaudid which again explained this and the role in therapy to help with distress associated with shortness of breath and patient's situation.  Discussed this medication is not scheduled  or continuous.  Status IV Dilaudid is only as needed when the patient asked for it.  They acknowledged this. Daughter noted patient was "upset" other day after speaking with oncologist because he  ordered the word "hospice".  Discussed that patient is continuing to receive all appropriate medical therapies at this time.  Did also acknowledged that patient's body may be tired and despite aggressive medical measures, patient's body may be shutting down.  Patient is not on hospice care at this time, continues to get appropriate medical therapies and attempts to see if patient will be able to recover from this (fix things that can be fixed).  Daughter and friend acknowledges plan.  All questions answered at that time.  Thanked them for allowing me to meet with them today and noted palliative medicine team will continue to follow along with patient's medical journey.  Objective:   Vital Signs:  BP 122/69   Pulse 93   Temp 98.4 F (36.9 C) (Axillary)   Resp (!) 25   Ht 5\' 9"  (1.753 m)   Wt 62.2 kg   SpO2 100%   BMI 20.25 kg/m   Physical Exam: General: increased work of breathing noted, alert, laying in bed, chronically ill appearing, frail, cachectic HENT: dry mucous membranes Cardiovascular: RRR Respiratory: increased work of breathing noted on HFNC and non re breather  Skin: no rashes or lesions on visible skin Neuro: A&Ox4 Psych: appropriately answers all questions  Imaging: I personally reviewed recent imaging.   Assessment & Plan:   Assessment: Patient is a 72yo male with a PMHx of HTN, HLD, prostate adenocarcinoma, and recent diagnosis of small cell carcinoma either with primary liver diagnosis or is secondary who was admitted on 2023/07/16 for management of worsening weakness. During hospitalization patient has received management for septic shock secondary to UTI, acute respiratory failure, and electrolyte imbalances. Oncology and PCCM consulted. Palliative medicine team consulted to assist with complex medical decision making.   Recommendations/Plan: # Complex medical decision making/goals of care:     -Again discussed care with patient and daughter. Continuing appropriate  medical therapies at this time to determine if patient will be able to improve from this event. Discussed patient not actively bleeding, potential for reaction (risks > benefits) so not receiving plt therapy).  Patient is not a candidate for further chemotherapy due to his debilitated state/poor functional status. Continuing to allow time for outcomes. PMT continues to engage in conversation as able.    -Daughter/her friend did ask to speak with this provider separately which patient agreed with occurring. Have explained to daughter again continuing attempts to fix what can medically be fixed though his body may be tired/shutting down due to underlying diease processes. Explained that palliative medicine is not hospice care and so patient is receiving appropriate medical interventions. Patient is not on comfort care/hospice are at this time; this is as per his stated wishes. Should he further deteriorate or state he wants to change focus of care, then can transition to comfort focused care. Answered daughter's questions regarding this.                 -Patient already gave permission to that his daughter should be called first with updates and then his friend/girlfriend, William Jones.                 -  Code Status: Limited: Do not attempt resuscitation (DNR) -DNR-LIMITED -Do Not Intubate/DNI     # Symptom management                -  As per PCCM   # Psycho-social/Spiritual Support:  - Support System: daughter (primary contact/NOK), friend/girlfriend  # Discharge Planning: To Be Determined  Discussed with: patient, patient's daughter/daughter's friend, PCCM providers, oncologist, hospitalist, RN  Thank you for allowing the palliative care team to participate in the care [facility].  Alvester Morin, DO Palliative Care Provider PMT # [phone]  If patient remains symptomatic despite maximum doses, please call PMT at [phone] between 0700 and 1900. Outside of these hours, please call  attending, as PMT does not have night coverage.  This provider spent a total of 51 minutes providing patient's care.  Includes review of EMR, discussing care with other staff members involved in patient's medical care, obtaining relevant history and information from patient and/or patient's family, and personal review of imaging and lab work. Greater than 50% of the time was spent counseling and coordinating care related to the above assessment and plan.    *Please note that this is a verbal dictation therefore any spelling or grammatical errors are due to the "[employer]" system interpretation.

## 2023-07-28 NOTE — Progress Notes (Signed)
Pharmacy Electrolyte Replacement  Recent Labs:   Recent Labs    07/28/23 0854  K 4.5  MG 1.7  PHOS 1.7*  CREATININE 0.36*   Pharmacy is consulted on 10/22 (by Dr Rhona Leavens) to assist with electrolyte replacement.  Patient is being aggressively diuresed on Lasix 60 mg IV BID since 10/20 for fluid overload and increased WOB.  Note: electrolyte supplementation given in IV fluid contribute to volume overload  UOP 10/20: 4225 mL UOP 10/21: 2550 mL UOP10/22: 3525 ml UOP 10/23: 2850 mL  Diet:  heart healthy, continue to try to utilize oral supplementation  K 4.5, improved to WNL:  Continue Kdur 40 mEq PO BID.  Giving Kphos (provides an additional 45 mEq K)  Mag 1.7:   Mag 2g IV x1, continue Magox 400mg  PO BID   CoCa 8.5 (albumin 2.1): Repeat CaGluconate 2g IV x 1, continue Oscal (500mg  elemental Ca) po BID   Phos 2.1:   Kphos IV x1, increase to Kphos Neutral 500mg  po QID.     Plan:  - BMET, Mg, Phos daily - Follow up plans for IV Lasix.  If dosing or frequency adjusted, will need to also adjust electrolyte supplementation.    Lynann Beaver PharmD, BCPS [facility] main pharmacy [phone] 07/28/2023 7:25 AM

## 2023-07-28 NOTE — Plan of Care (Signed)
  Problem: Coping: Goal: Ability to adjust to condition or change in health will improve Outcome: Progressing   Problem: Nutritional: Goal: Maintenance of adequate nutrition will improve Outcome: Progressing Goal: Progress toward achieving an optimal weight will improve Outcome: Progressing   Problem: Education: Goal: Ability to describe self-care measures that may prevent or decrease complications (Diabetes Survival Skills Education) will improve Outcome: Not Progressing   Problem: Metabolic: Goal: Ability to maintain appropriate glucose levels will improve Outcome: Not Progressing   Problem: Skin Integrity: Goal: Risk for impaired skin integrity will decrease Outcome: Not Progressing

## 2023-07-28 NOTE — Plan of Care (Signed)
  Problem: Education: Goal: Ability to describe self-care measures that may prevent or decrease complications (Diabetes Survival Skills Education) will improve Outcome: Progressing   Problem: Coping: Goal: Ability to adjust to condition or change in health will improve Outcome: Progressing   Problem: Fluid Volume: Goal: Ability to maintain a balanced intake and output will improve Outcome: Progressing   Problem: Health Behavior/Discharge Planning: Goal: Ability to identify and utilize available resources and services will improve Outcome: Progressing Goal: Ability to manage health-related needs will improve Outcome: Progressing   Problem: Skin Integrity: Goal: Risk for impaired skin integrity will decrease Outcome: Progressing

## 2023-07-28 NOTE — Progress Notes (Signed)
   07/28/23 1553  Oxygen Therapy/Pulse Ox  O2 Device HFNC;Non-rebreather Mask  O2 Therapy Oxygen humidified  O2 Flow Rate (L/min) (S)  25 L/min (10 L HFNC, 100% NRB. RT tried to wean pt down, removed the NRB for less than a minute, watched the monitor, SP02 dropped quickly, got to 86%, during reapplying the NRB, Pt rebounded quickly to 95%.)  FiO2 (%) 100 %  SpO2 92 %

## 2023-07-28 NOTE — Progress Notes (Signed)
[facility]                                                                              William Jones, is a 72 y.o. male, DOB - 22-Apr-1951, XBM:[id number] Admit date - 07/28/2023    Outpatient Primary MD for the patient is Morene Crocker, MD  LOS - 16  days  Chief Complaint  Patient presents with   Weakness       Brief summary   72 year old M with HTN, HLD, and prostate cancer, recently admitted for generalized weakness, found to have new metastatic neuroendocrine tumor to liver and adrenals.   Was discharged home with plans for outpatient chemo start, but became profoundly weak, returned to the hospital   In the ER, mild transaminitis due to tumor, mild hypokalemia, WBC normal, no fever.  UA with pyuria. 10/6: Started on CTX, admitted for weakness, Xtandi held 10/7:  Oncology consulted, planned for inpatient chemo   Assessment & Plan    Principal Problem: Septic shock, lactic acidosis, UTI -Urine culture showed Klebsiella oxytoca.  Blood culture has been negative.   -CT scan abdomen pelvis 10/8 that showed multifocal pneumonia, innumerable liver masses consistent with metastatic disease, multiple adrenal masses and ? Pancreatitis  Active Problems:   Acute respiratory failure with hypoxia (HCC) -At baseline, not on any O2 at home -On 10/20, patient was transferred to SDU due to markedly worsened respiratory status and increased O2 requirements. -Chest x-ray on 10/20 showed pulmonary edema, atypical infection -Continue diuresis, 60 mg IV Lasix twice daily -CTA chest showed no PE, diffuse airspace opacities noted throughout both lungs consistent with pneumonia or possibly edema.  Hepatic and adrenal metastatic lesions noted, T10 metastatic lesion. -Still hypoxic, on BiPAP overnight and on 15 L O2 HFNC this morning -PCCM following, started on steroids on 10/23 -Placed on p.o. 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 Al Pimple, did not recommend platelet transfusion  Anemia: -Patient underwent EGD on July 17, 2023 -Colonoscopy 10/18 with findings of external hemorrhoids. Per GI, if bleeding occurs, ice pack can help and if bleeding persists, surgical eval may be required -Hemoglobin stable 9.5     Essential hypertension -BP stable, continue metoprolol 5 mg IV every 6 hours  Acute Transaminitis -Likely due to shock liver on presentation, improving    Prostate cancer metastatic to bone (HCC) -Xtandi is on hold for now while in hospital   Hypokalemia: -Replace as needed   Hypophosphatemia: -Replaced with IV K-Phos, pharmacy assisting with electrolyte replacement    Hypomagnesemia: -Currently stable, 2.0   Hypocalcemia: -likely due to chronic illness   -improving, continue to replace     Moderate protein calorie malnutrition Nutrition Problem: Moderate Malnutrition Etiology: chronic illness, cancer and cancer related  treatments Signs/Symptoms: mild fat depletion, moderate muscle depletion, percent weight loss Interventions: Ensure Enlive (each supplement provides 350kcal and 20 grams of protein), Education  Estimated body mass index is 20.25 kg/m as calculated from the following:   Height as of this encounter: 5\' 9"  (1.753 m).   Weight as of this encounter: 62.2 kg.  Code Status: full code DVT Prophylaxis:  Place and maintain sequential compression device Start: 07/14/23 1101   Level of Care: Level of care: Stepdown Family  Communication: Updated patient Disposition Plan:      Remains inpatient appropriate:      Procedures:    Consultants:   Pulm CCM  Antimicrobials:   Anti-infectives (From admission, onward)    Start     Dose/Rate Route Frequency Ordered Stop   07/27/23 2100  ceFEPIme (MAXIPIME) 2 g in sodium chloride 0.9 % 100 mL IVPB        2 g 200 mL/hr over 30 Minutes Intravenous Every 8 hours 07/27/23 1259 07/28/23 0618   07/27/23 1100  vancomycin (VANCOREADY) IVPB 1750 mg/350 mL        1,750 mg 175 mL/hr over 120 Minutes Intravenous Every 24 hours 07/27/23 0950     07/27/23 1030  ceFEPIme (MAXIPIME) 2 g in sodium chloride 0.9 % 100 mL IVPB  Status:  Discontinued        2 g 200 mL/hr over 30 Minutes Intravenous Every 8 hours 07/27/23 0937 07/27/23 1259   07/27/23 1030  vancomycin (VANCOREADY) IVPB 1250 mg/250 mL  Status:  Discontinued        1,250 mg 166.7 mL/hr over 90 Minutes Intravenous  Once 07/27/23 0937 07/27/23 0950   07/24/23 1700  cefTRIAXone (ROCEPHIN) 2 g in sodium chloride 0.9 % 100 mL IVPB  Status:  Discontinued        2 g 200 mL/hr over 30 Minutes Intravenous Daily 07/24/23 1630 07/25/23 1453   07/24/23 1700  azithromycin (ZITHROMAX) 500 mg in sodium chloride 0.9 % 250 mL IVPB  Status:  Discontinued        500 mg 250 mL/hr over 60 Minutes Intravenous Daily 07/24/23 1630 07/25/23 1453   07/17/23 1200  Ampicillin-Sulbactam (UNASYN) 3 g in sodium chloride 0.9 % 100 mL IVPB        3 g 200 mL/hr over 30 Minutes Intravenous Every 6 hours 07/17/23 1148 07/18/23 0049   07/14/23 1200  Ampicillin-Sulbactam (UNASYN) 3 g in sodium chloride 0.9 % 100 mL IVPB  Status:  Discontinued        3 g 200 mL/hr over 30 Minutes Intravenous Every 6 hours 07/14/23 1028 07/17/23 1148   07/13/23 0300  vancomycin (VANCOREADY) IVPB 750 mg/150 mL  Status:  Discontinued        750 mg 150 mL/hr over 60 Minutes Intravenous Every 12 hours 07/12/23 1403 07/14/23 0830   07/12/23 1430  vancomycin (VANCOREADY)  IVPB 1500 mg/300 mL        1,500 mg 150 mL/hr over 120 Minutes Intravenous  Once 07/12/23 1402 07/12/23 1826   07/12/23 1200  meropenem (MERREM) 1 g in sodium chloride 0.9 % 100 mL IVPB  Status:  Discontinued        1 g 200 mL/hr over 30 Minutes Intravenous Every 8 hours 07/12/23 1102 07/14/23 1028   07/11/23 1800  cefTRIAXone (ROCEPHIN) 1 g in sodium chloride 0.9 % 100 mL IVPB  Status:  Discontinued        1 g 200 mL/hr over 30 Minutes Intravenous Every 24 hours  2023/07/17 1911 07/12/23 1102   07/17/23 1800  cefTRIAXone (ROCEPHIN) 1 g in sodium chloride 0.9 % 100 mL IVPB        1 g 200 mL/hr over 30 Minutes Intravenous  Once 2023-07-17 1745 07/17/2023 2132          Medications  calcium carbonate  1 tablet Oral BID   Chlorhexidine Gluconate Cloth  6 each Topical Q2200   docusate sodium  100 mg Oral BID   dorzolamide  1 drop Right Eye BID   feeding supplement  1 Container Oral TID BM   furosemide  60 mg Intravenous BID   insulin aspart  0-15 Units Subcutaneous TID WC   insulin aspart  0-5 Units Subcutaneous QHS   insulin glargine-yfgn  8 Units Subcutaneous Daily   magnesium oxide  400 mg Oral BID   methylPREDNISolone (SOLU-MEDROL) injection  60 mg Intravenous Q12H   metoprolol tartrate  5 mg Intravenous Q6H   pantoprazole  40 mg Oral BID AC   phosphorus  500 mg Oral BID   potassium chloride  40 mEq Oral BID   sodium chloride flush  3 mL Intravenous Q12H   timolol  1 drop Right Eye BID      Subjective:   William Jones was seen and examined today.  No improvement in the pulmonary status, overnight needed BiPAP, on 15 L HFNC this morning.  Denies any nausea vomiting, abdominal pain or dizziness.   Vitals:   07/28/23 0757 07/28/23 0800 07/28/23 0900 07/28/23 1000  BP:  132/69 (!) 140/118 129/76  Pulse: 93 84 100 (!) 113  Resp: (!) 25 (!) 22 (!) 25 (!) 29  Temp: 98.4 F (36.9 C)     TempSrc: Axillary     SpO2: 100% 100% 98% 100%  Weight:      Height:         Intake/Output Summary (Last 24 hours) at 07/28/2023 1100 Last data filed at 07/28/2023 0600 Gross per 24 hour  Intake 1441.3 ml  Output 1700 ml  Net -258.7 ml     Wt Readings from Last 3 Encounters:  07/28/23 62.2 kg  07/07/23 68.3 kg  07/05/23 71.1 kg   Physical Exam General: Alert and oriented x 3, ill-appearing Cardiovascular: S1 S2 clear, RRR.  Respiratory: Diminished breath sound at the bases with bibasilar crackles Gastrointestinal: Soft, nontender, nondistended, NBS Ext: no pedal edema bilaterally Neuro: no new deficits Psych: Normal affect    Data Reviewed:  I have personally reviewed following labs    CBC Lab Results  Component Value Date   WBC 8.8 07/28/2023   RBC 3.16 (L) 07/28/2023   HGB 9.5 (L) 07/28/2023   HCT 30.3 (L) 07/28/2023   MCV 95.9 07/28/2023   MCH 30.1 07/28/2023   PLT 20 (LL) 07/28/2023   MCHC 31.4 07/28/2023   RDW 17.1 (H) 07/28/2023   LYMPHSABS 0.5 (L) 07/20/2023   MONOABS 0.3 07/20/2023   EOSABS 0.0 07/20/2023   BASOSABS 0.0 07/20/2023     Last metabolic panel Lab Results  Component Value Date   NA 146 (H) 07/28/2023   K 4.5 07/28/2023   CL 104 07/28/2023   CO2 30 07/28/2023   BUN 18 07/28/2023   CREATININE 0.36 (L) 07/28/2023   GLUCOSE 115 (H) 07/28/2023   GFRNONAA >60 07/28/2023   GFRAA 103 12/18/2019   CALCIUM 7.0 (L) 07/28/2023   PHOS 1.7 (L) 07/28/2023   PROT 5.1 (L) 07/26/2023   ALBUMIN 2.1 (L) 07/26/2023   LABGLOB 2.4 04/10/2019  AGRATIO 1.6 04/10/2019   BILITOT 0.9 07/26/2023   ALKPHOS 263 (H) 07/26/2023   AST 39 07/26/2023   ALT 64 (H) 07/26/2023   ANIONGAP 12 07/28/2023    CBG (last 3)  Recent Labs    07/27/23 2124 07/28/23 0330 07/28/23 0816  GLUCAP 80 102* 112*      Coagulation Profile: No results for input(s): "INR", "PROTIME" in the last 168 hours.   Radiology Studies: I have personally reviewed the imaging studies  CT Angio Chest Pulmonary Embolism (PE) W or WO Contrast  Result  Date: 07/27/2023 CLINICAL DATA:  Hypoxia. EXAM: CT ANGIOGRAPHY CHEST WITH CONTRAST TECHNIQUE: Multidetector CT imaging of the chest was performed using the standard protocol during bolus administration of intravenous contrast. Multiplanar CT image reconstructions and MIPs were obtained to evaluate the vascular anatomy. RADIATION DOSE REDUCTION: This exam was performed according to the departmental dose-optimization program which includes automated exposure control, adjustment of the mA and/or kV according to patient size and/or use of iterative reconstruction technique. CONTRAST:  75mL OMNIPAQUE IOHEXOL 350 MG/ML SOLN COMPARISON:  July 12, 2023. FINDINGS: Cardiovascular: Satisfactory opacification of the pulmonary arteries to the segmental level. No evidence of pulmonary embolism. Normal heart size. No pericardial effusion. Mediastinum/Nodes: No enlarged mediastinal, hilar, or axillary lymph nodes. Thyroid gland, trachea, and esophagus demonstrate no significant findings. Lungs/Pleura: Diffuse airspace opacities are noted throughout both lungs most consistent with pneumonia or possibly edema. No pneumothorax or pleural effusion is noted. Upper Abdomen: Multiple hepatic metastatic lesions are again noted as well as bilateral adrenal metastatic lesions. Musculoskeletal: Probable T10 metastatic lesion is noted. Review of the MIP images confirms the above findings. IMPRESSION: No definite evidence of pulmonary embolus. Diffuse airspace opacities are noted throughout both lungs most consistent with pneumonia or possibly edema. Hepatic and adrenal metastatic lesions are noted. Probable T10 metastatic lesion. Electronically Signed   By: Lupita Raider M.D.   On: 07/27/2023 19:40   DG CHEST PORT 1 VIEW  Result Date: 07/27/2023 CLINICAL DATA:  72 year old male with hypoxia. Metastatic cancer, inpatient chemotherapy, sepsis. EXAM: PORTABLE CHEST 1 VIEW COMPARISON:  Portable chest 07/24/2023 and earlier. FINDINGS:  Portable AP semi upright view at 0843 hours. Slightly lower lung volumes. Increased diffuse and coarse reticulonodular lung opacity, still asymmetrically greater on the left. No superimposed pneumothorax, consolidation, pleural effusion. Visualized tracheal air column is within normal limits. No acute osseous abnormality identified. Negative visible bowel gas. IMPRESSION: Progressed asymmetric, diffuse pulmonary interstitial opacity since 07/24/2023. Top differential considerations are drug reaction, progressed asymmetric pulmonary edema and viral/atypical respiratory infection. Electronically Signed   By: Odessa Fleming M.D.   On: 07/27/2023 11:55       Gerren Hoffmeier M.D. [facility] 07/28/2023, 11:00 AM  Available via Epic secure chat 7am-7pm After 7 pm, please refer to night coverage provider listed on amion.

## 2023-07-29 DIAGNOSIS — R531 Weakness: Secondary | ICD-10-CM | POA: Diagnosis not present

## 2023-07-29 DIAGNOSIS — N3000 Acute cystitis without hematuria: Secondary | ICD-10-CM | POA: Diagnosis not present

## 2023-07-29 DIAGNOSIS — Z515 Encounter for palliative care: Secondary | ICD-10-CM | POA: Diagnosis not present

## 2023-07-29 DIAGNOSIS — R5383 Other fatigue: Secondary | ICD-10-CM | POA: Diagnosis not present

## 2023-07-29 DIAGNOSIS — C801 Malignant (primary) neoplasm, unspecified: Secondary | ICD-10-CM | POA: Diagnosis not present

## 2023-07-29 DIAGNOSIS — J810 Acute pulmonary edema: Secondary | ICD-10-CM | POA: Diagnosis not present

## 2023-07-29 DIAGNOSIS — R579 Shock, unspecified: Secondary | ICD-10-CM | POA: Diagnosis not present

## 2023-07-29 LAB — GLUCOSE, CAPILLARY
Glucose-Capillary: 306 mg/dL — ABNORMAL HIGH (ref 70–99)
Glucose-Capillary: 232 mg/dL — ABNORMAL HIGH (ref 70–99)
Glucose-Capillary: 241 mg/dL — ABNORMAL HIGH (ref 70–99)
Glucose-Capillary: 190 mg/dL — ABNORMAL HIGH (ref 70–99)
Glucose-Capillary: 82 mg/dL (ref 70–99)
Glucose-Capillary: 132 mg/dL — ABNORMAL HIGH (ref 70–99)

## 2023-07-29 LAB — BASIC METABOLIC PANEL
Anion gap: 11 (ref 5–15)
BUN: 26 mg/dL — ABNORMAL HIGH (ref 8–23)
CO2: 27 mmol/L (ref 22–32)
Calcium: 6.8 mg/dL — ABNORMAL LOW (ref 8.9–10.3)
Chloride: 103 mmol/L (ref 98–111)
Creatinine, Ser: 0.62 mg/dL (ref 0.61–1.24)
GFR, Estimated: >60 mL/min (ref >60)
Glucose, Bld: 333 mg/dL — ABNORMAL HIGH (ref 70–99)
Potassium: 3.6 mmol/L (ref 3.5–5.1)
Sodium: 141 mmol/L (ref 135–145)

## 2023-07-29 LAB — HEPARIN INDUCED PLATELET AB (HIT ANTIBODY): Heparin Induced Plt Ab: 0.086 {OD_unit} (ref 0.000–0.400)

## 2023-07-29 LAB — CBC
HCT: 28.2 % — ABNORMAL LOW (ref 39.0–52.0)
Hemoglobin: 8.8 g/dL — ABNORMAL LOW (ref 13.0–17.0)
MCH: 30.1 pg (ref 26.0–34.0)
MCHC: 31.2 g/dL (ref 30.0–36.0)
MCV: 96.6 fL (ref 80.0–100.0)
Platelets: 11 10*3/uL — CL (ref 150–400)
RBC: 2.92 MIL/uL — ABNORMAL LOW (ref 4.22–5.81)
RDW: 16.8 % — ABNORMAL HIGH (ref 11.5–15.5)
WBC: 8.3 10*3/uL (ref 4.0–10.5)
nRBC: 1.6 % — ABNORMAL HIGH (ref 0.0–0.2)

## 2023-07-29 LAB — MAGNESIUM: Magnesium: 2 mg/dL (ref 1.7–2.4)

## 2023-07-29 LAB — PHOSPHORUS: Phosphorus: 3 mg/dL (ref 2.5–4.6)

## 2023-07-29 LAB — SEDIMENTATION RATE: Sed Rate: 20 mm/h — ABNORMAL HIGH (ref 0–16)

## 2023-07-29 LAB — BRAIN NATRIURETIC PEPTIDE: B Natriuretic Peptide: 155.2 pg/mL — ABNORMAL HIGH (ref 0.0–100.0)

## 2023-07-29 LAB — PROCALCITONIN: Procalcitonin: 22.96 ng/mL

## 2023-07-29 MED ORDER — HYDROCORTISONE ACETATE 25 MG RE SUPP: 25.0000 mg | Freq: Two times a day (BID) | RECTAL | Status: DC | PRN

## 2023-07-29 MED ORDER — HYDROCORTISONE (PERIANAL) 2.5 % EX CREA: TOPICAL_CREAM | Freq: Two times a day (BID) | CUTANEOUS | Status: DC | PRN

## 2023-07-29 MED ORDER — GERHARDT'S BUTT CREAM
TOPICAL_CREAM | Freq: Two times a day (BID) | CUTANEOUS | Status: DC
  Administered 2023-07-30 – 2023-08-01 (×2): 1 via TOPICAL
  Filled 2023-07-29: qty 1

## 2023-07-29 MED ORDER — SODIUM CHLORIDE 0.9% IV SOLUTION: Freq: Once | INTRAVENOUS | Status: AC

## 2023-07-29 MED ORDER — POTASSIUM PHOSPHATES 15 MMOLE/5ML IV SOLN
20.0000 mmol | Freq: Once | INTRAVENOUS | Status: AC
  Administered 2023-07-29: 20 mmol via INTRAVENOUS
  Filled 2023-07-29: qty 6.67

## 2023-07-29 MED ORDER — PIPERACILLIN-TAZOBACTAM 3.375 G IVPB
3.3750 g | Freq: Three times a day (TID) | INTRAVENOUS | Status: DC
  Administered 2023-07-29 – 2023-08-04 (×18): 3.375 g via INTRAVENOUS
  Filled 2023-07-29 (×18): qty 50

## 2023-07-29 MED ORDER — INSULIN GLARGINE-YFGN 100 UNIT/ML ~~LOC~~ SOLN
11.0000 [IU] | Freq: Every day | SUBCUTANEOUS | Status: DC
  Administered 2023-07-29 – 2023-08-02 (×5): 11 [IU] via SUBCUTANEOUS
  Filled 2023-07-29 (×6): qty 0.11

## 2023-07-29 MED ORDER — PANTOPRAZOLE SODIUM 40 MG IV SOLR
40.0000 mg | Freq: Two times a day (BID) | INTRAVENOUS | Status: DC
  Administered 2023-07-29 – 2023-08-04 (×13): 40 mg via INTRAVENOUS
  Filled 2023-07-29 (×13): qty 10

## 2023-07-29 MED ORDER — HYDROCORTISONE ACETATE 25 MG RE SUPP
25.0000 mg | Freq: Two times a day (BID) | RECTAL | Status: DC
  Filled 2023-07-29: qty 1

## 2023-07-29 MED ORDER — INSULIN ASPART 100 UNIT/ML IJ SOLN
4.0000 [IU] | Freq: Three times a day (TID) | INTRAMUSCULAR | Status: DC
  Administered 2023-07-29: 4 [IU] via SUBCUTANEOUS

## 2023-07-29 MED ORDER — MAGNESIUM SULFATE 2 GM/50ML IV SOLN
2.0000 g | Freq: Once | INTRAVENOUS | Status: AC
  Administered 2023-07-29: 2 g via INTRAVENOUS
  Filled 2023-07-29: qty 50

## 2023-07-29 MED ORDER — POTASSIUM CHLORIDE CRYS ER 20 MEQ PO TBCR: 40.0000 meq | EXTENDED_RELEASE_TABLET | Freq: Once | ORAL | Status: DC

## 2023-07-29 MED ORDER — CALCIUM GLUCONATE-NACL 2-0.675 GM/100ML-% IV SOLN
2.0000 g | Freq: Once | INTRAVENOUS | Status: AC
  Administered 2023-07-29: 2000 mg via INTRAVENOUS
  Filled 2023-07-29: qty 100

## 2023-07-29 MED ORDER — POTASSIUM CHLORIDE 10 MEQ/100ML IV SOLN
10.0000 meq | INTRAVENOUS | Status: AC
  Administered 2023-07-29 (×5): 10 meq via INTRAVENOUS
  Filled 2023-07-29 (×5): qty 100

## 2023-07-29 NOTE — Progress Notes (Signed)
Pharmacy Antibiotic Note  William Jones is a 72 y.o. male admitted on 07/10/2023 with past medical history significant for prostate adenocarcinoma currently on Xgeva, Xtandi and Lupron now diagnosed with small cell carcinoma either primary liver or secondary admitted with worsening weakness.  Today, cefepime & vancomycin discontinued.  Pharmacy has now been consulted for Zosyn dosing for sepsis.  Plan: Zosyn 3.375g IV q8h (4 hour infusion). With no dose adjustments anticipated, Pharmacy will sign off on consult and continue to monitor clinical progress, renal function, and LOT.  Height: 5\' 9"  (175.3 cm) Weight: 62.1 kg (136 lb 14.5 oz) IBW/kg (Calculated) : 70.7  Temp (24hrs), Avg:98 F (36.7 C), Min:97.6 F (36.4 C), Max:98.4 F (36.9 C)  Recent Labs  Lab 07/24/23 1659 07/24/23 1941 07/25/23 0256 07/25/23 1403 07/26/23 1741 07/27/23 0302 07/27/23 1008 07/27/23 1729 07/28/23 0854 07/29/23 0303  WBC 8.8  --  8.6  --   --   --  7.6  --  8.8 8.3  CREATININE  --  0.47* 0.37*   < > 0.51* 0.41*  --  0.45* 0.36* 0.62  LATICACIDVEN 2.3* 2.5*  --   --   --   --   --   --   --   --    < > = values in this interval not displayed.    Estimated Creatinine Clearance: 75.5 mL/min (by C-G formula based on SCr of 0.62 mg/dL).    Allergies  Allergen Reactions   Heparin     HIT-AB pending   Metformin And Related Other (See Comments)    Bad headaches   Antimicrobials this admission:  10/6 CTX >> 10/8 meropenem >> 10/9, 10/20 x 5d 10/20: Azithro x 5d 10/10 Unasyn >> 10/13 10/8 vanc >> 10/10, 10/23>>10/25 10/23 Cefepime>>10/25 10/25 Zosyn>>   Dose adjustments this admission:  - 10/23:Vancomycin 1750 mg IV Q 24 hrs. Expected AUC: 488, SCr used: 0.8 - 10/24: Change to Vancomycin 750 mg IV q12h  (SCr rounded to 0.8, TBW < IBW, Est AUC 499    Microbiology results:  10/6 ucx: >100K kleb oxytoca (R-Amp; no CLSI brkpoint for Ancef) 10/8 bcx x2: neg FINAL  10/8 MRSA PCR: neg 10/24  MRSA PCR: neg   Thank you for allowing pharmacy to be a part of this patient's care.  Selinda Eon, PharmD, BCPS Clinical Pharmacist [place] 07/29/2023 3:34 PM

## 2023-07-29 NOTE — Progress Notes (Signed)
NAME:  William Jones, MRN:  [id number], DOB:  1951-07-06, LOS: 17 ADMISSION DATE:  07/05/2023, CONSULTATION DATE:  10/8 REFERRING MD:  Dr. Maryfrances Bunnell, CHIEF COMPLAINT:  hypotension, lactic acidosis   History of Present Illness:  72 year old male with PMH as below, which is significant for prostate cancer, substance abuse, GI bleeding, HTN, and HLD. He takes Xtandi for metastatic prostate Ca. Recently admitted for weakness and was found to have diffuse hepatic metastatic disease and metastasis to the adrenal glands as well. 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 Primary vs mets uncertain. He was discharged and followed up with oncology 10/3 with plans to start carboplatin and etoposide 10/8, but unfortunately he once again became weak and presented to [facility] ED 10/6. He was concerned he would be unable to get the care he needed at home and would miss appointments due to feeling weak and was hopeful to be admitted to the hospital. He was admitted to the hospital for generalized weakness and possible UTI. He was seen by oncology inpatient and was scheduled to start inpatient chemotherapy, however, in the AM hours of 10/8 he became tachycardic and labs indicated worsening acidosis. Lactic acid was check and was found to be greater than 9. The patient was transferred to ICU for closer monitoring and PCCM was consulted. The patient was treated with IVF. BP and LA improved. PCCM signed off.   Subsequent course has been complicated by klebsiella UTI, anemia, and hypoxia. Anemia was evaluated by GI with upper and lower scopes. Lower scope found hemorrhoids but no clear evidence of bleeding. Hypoxia has been progressive. CXR was concerning for PNA vs edema and he has been treated with CAP antibiotics as well as diuresis, but hypoxia continued to worsen. PCCM called back 10/23 for hypoxia. DNR/DNI. Daughter updated.   Pertinent  Medical History   has a past medical  history of Angiodysplasia of colon with hemorrhage (10/14/2020), Duodenitis, Essential hypertension (07/16/2015), Fatigue (11/06/2020), Glaucoma, H/O: substance abuse (HCC), Hemorrhage of rectum and anus (10/14/2020), History of frostbite, History of prostate cancer (03/20/2014), Hyperlipidemia (11/20/2019), Iron deficiency anemia, Malignant tumor of prostate (HCC) (10/14/2020), Peripheral neuropathy, S/P radiation therapy (09/18/14-11/15/14), and Thrombocytopenia (HCC).   Significant Hospital Events: Including procedures, antibiotic start and stop dates in addition to other pertinent events   10/6 admit for generalized weakness 10/8 tx to ICU for LA 9  improved with volume 10/11 EGD erosive gastropathy with no bleeding stigmata 10/18 colonoscopy with diverticulosis and external hemorrhoids, no bleeding.  10/23 PCCM consult for hypoxia CT no obvious PE. Diffuse airspace opacities concerning for edema or infection.  10/24 Breathing a little better this morning, on 12L [place] and 15L nRB. BiPAP intermittently overnight.  10/25 Unable to tolerate BIPAP overnight, remains on [place] and NRB  Interim History / Subjective:  States he feels "fine"   Objective   Blood pressure 126/69, pulse (!) 103, temperature 97.9 F (36.6 C), temperature source Axillary, resp. rate 18, height 5\' 9"  (1.753 m), weight 62.1 kg, SpO2 99%.    FiO2 (%):  [60 %-100 %] 60 %   Intake/Output Summary (Last 24 hours) at 07/29/2023 0705 Last data filed at 07/29/2023 0600 Gross per 24 hour  Intake 1255.76 ml  Output 600 ml  Net 655.76 ml   Filed Weights   07/27/23 0500 07/28/23 0500 07/29/23 0500  Weight: 65.7 kg 62.2 kg 62.1 kg    Examination:  General: Acute on chronically ill appearing elderly male lying  in bed on NRB and HFNC, in NAD HEENT: [place]/AT, MM pink/moist, PERRL,  Neuro: Alert and oriented x3, non-focal  CV: s1s2 regular rate and rhythm, no murmur, rubs, or gallops,  PULM:  Diminished bilaterally, no increased  work of breathing, no added breath sound GI: soft, bowel sounds active in all 4 quadrants, non-tender, non-distended, tolerating oral diet Extremities: warm/dry, no edema  Skin: no rashes or lesions  Resolved Hospital Problem list   UTI - klebsiella oxytoca Lactic acidosis  Assessment & Plan:   Acute respiratory failure with hypoxia -Most consistent with pulmonary edema, but not really improving with diuresis. CTA negative for PE. Diffuse opacities.  P: Continue supplemental oxygen wean for sat goal > 92% BIPAP during day rest and at night  Continue to diureses as able  Aspiration precautions  Continue IV steroids  Remains on empiric cefepime and vancomycin   Malignant neuroendocrine carcinoma with metastatics to the liver and adrenals    Prostate cancer (adeno) with bone mets P: Management per oncology - Hospice recommended  Trend LFTs  Supportive care   Anemia  -s/p EGD and colonoscopy with no evidence of bleeding. Erosive gastropathy and external hemorrhoids  Severe thrombocytopenia  P: Trend CBC  Transfuse per protocol Plt goal > 10 Monitor for sings of bleeding   Hypokalemia Hypophosphatemia Hypomagnesemia Hypocalcemia P: Supplement as needed  Trend BMP  DM P: CBG elevated this am in the setting of steroids  Increase SSI  CBG check ACHS  CBG goal 140-180  Moderate protein calorie malnutrition  P:   Goals of care addressed with multiple providers 10/23 including the palliative medicine team. He does not want to suffer (DNR/DNI), but does want to get better. Will focus on trying to improve his respiratory failure as above with ongoing discussion of GOC  Best Practice (right click and "Reselect all SmartList Selections" daily)   Per primary   Critical care time:    Performed by: Lavra Imler D. Harris  Total critical care time: 37 minutes  Critical care time was exclusive of separately billable procedures and treating other patients.  Critical care  was necessary to treat or prevent imminent or life-threatening deterioration.  Critical care was time spent personally by me on the following activities: development of treatment plan with patient and/or surrogate as well as nursing, discussions with consultants, evaluation of patient's response to treatment, examination of patient, obtaining history from patient or surrogate, ordering and performing treatments and interventions, ordering and review of laboratory studies, ordering and review of radiographic studies, pulse oximetry and re-evaluation of patient's condition.  Brynnlie Unterreiner D. Harris, NP-C [facility] Personal contact information can be found on Amion  If no contact or response made please call [phone] 07/29/2023, 7:10 AM

## 2023-07-29 NOTE — IPAL (Signed)
  Interdisciplinary Goals of Care Family Meeting   Date carried out: 07/29/2023  Location of the meeting: Bedside  Member's involved: Nurse Practitioner, Bedside Registered Nurse, and Family Member or next of kin  Durable Power of Attorney or acting medical decision maker: Daughter - William Jones  Discussion: Rounded on patient this afternoon with daughter and friend at bedside. We discussed currently clinical plan which lead into a repeat GOC discussion. Patient and family maintain desire to continue aggressive interventions with hopes of continued improvement but also want to take into account patients quality of life. Therefore plan of care has been updated to include the following: No intubation  No further BIPAP therapy if hypoxia develops treat with HFNC and NRB if these interventions no longer support oxygenation reconnect with patient and family to discuss GOC Continue IV antibiotics as recommended per clinical team   Platelet transfusion as needed  Regular diet with knowledge that aspiration or worsening hypoxia may develop. See above for management of hypoxia  Continued attempts to ambulate or move for comfort   Code status: Full DNR  Disposition: Continue current acute care  Time spent for the meeting: 40 mins  William Jones D. Harris 07/29/2023, 5:28 PM

## 2023-07-29 NOTE — Progress Notes (Signed)
OT Cancellation Note  Patient Details Name: William Jones MRN: [id number] DOB: 12/15/1950   Cancelled Treatment:    Reason Eval/Treat Not Completed: Medical issues which prohibited therapy. Per conversation with PT who spoke with pt's nurse, the pt continues to be limited by compromised respiratory status, as he requires either constant Bipap or HFNC vs. Non-re breather support. Given pt's compromised medical state, he is likely not able to tolerate participation in therapy at current. OT will sign off. Please re-consult as appropriate, should there be improvement in pt's ability to participate in therapy.    Reuben Likes 07/29/2023, 2:28 PM

## 2023-07-29 NOTE — TOC Progression Note (Signed)
Transition of Care [facility]) - Progression Note    Patient Details  Name: William Jones MRN: [id number] Date of Birth: 1951-03-13  Transition [facility] CM/SW Contact  Lavenia Atlas, RN Phone Number: 07/29/2023, 5:44 PM  Clinical Narrative:   Per chart review patient not a candidate for further cancer directed therapy. Palliative following for GOC, MD has recommended hospice however patient not interested at this time.  TOC following for discharge needs.    Expected Discharge Plan:  (unknown (potential hospice)) Barriers to Discharge: Continued Medical Work up  Expected Discharge Plan and Services In-house Referral: Hospice / Palliative Care, Chaplain Discharge Planning Services: CM Consult Post Acute Care Choice:  (undetermined) Living arrangements for the past 2 months: Apartment                 DME Arranged: N/A DME Agency: NA       HH Arranged: NA HH Agency: NA         Social Determinants of Health (SDOH) Interventions SDOH Screenings   Food Insecurity: No Food Insecurity (06/30/2023)  Housing: Patient Declined (06/30/2023)  Transportation Needs: No Transportation Needs (06/30/2023)  Utilities: Not At Risk (06/30/2023)  Alcohol Screen: Low Risk  (10/20/2022)  Depression (PHQ2-9): Low Risk  (06/24/2023)  Financial Resource Strain: Low Risk  (06/25/2020)  Physical Activity: Sufficiently Active (06/25/2020)  Social Connections: Moderately Integrated (06/25/2020)  Stress: No Stress Concern Present (10/20/2022)  Tobacco Use: Medium Risk (07/22/2023)    Readmission Risk Interventions    07/29/2023    5:36 PM 07/13/2023   11:19 AM 07/13/2023   11:18 AM  Readmission Risk Prevention Plan  Transportation Screening Complete  Complete  Medication Review Oceanographer) Complete  Complete  PCP or Specialist appointment within 3-5 days of discharge Complete  Complete  HRI or Home Care Consult Complete  Complete  SW Recovery Care/Counseling Consult Complete   Complete  Palliative Care Screening Complete Not Applicable Complete  Skilled Nursing Facility Complete  Complete

## 2023-07-29 NOTE — Progress Notes (Signed)
Daily Progress Note   Patient Name: William Jones       Date: 07/29/2023 DOB: Feb 06, 1951  Age: 72 y.o. MRN#: [id number] Attending Physician: Cathren Harsh, MD Primary Care Physician: Morene Crocker, MD Admit Date: 2023/07/20 Length of Stay: 17 days  Reason for Consultation/Follow-up: Establishing goals of care  Subjective:   CC: Patient notes he is "tired". Following up regarding complex medical decision making.   Subjective:  Reviewed EMR prior to presenting to bedside.  Discussed care with hospitalist, oncologist, and RN for medical updates. Platelets have continued to trend down to 11 today.  Oncologist visited with patient today in person and updated patient's daughter via phone. Patient's medical status is worsening and he is not a candidate for cancer directed therapies. His respiratory status continues to worsen despite aggressive medical interventions. Oncologist recommended comfort/hospice focused care.   Presented to bedside in afternoon to see patient. Patient's daughter present at bedside. RN able to update patient's daughter again about conversation with oncologist. In addition to this, patient has told many providers that he is "tired". Patient had even told oncologist he understood that if he removed oxygen support he would likely die within minutes to hours and patient elected that he would want to proceed with comfort care". Daughter appeared upset at bedside to hear this and asked to speak with patient privately which patient agreed with. This provider and RN excused ourselves from the room as requested.   Presented to bedside again after allowing time for patient and daughter to visit. Sat down with patient to explore again all he has heard from providers. Patient acknowledges he heard that "there is nothing more to do" and that he isn't a candidate for cancer directed therapies. Spent time providing emotional support via active listening. Inquired if other  family coming to visit with this news being told and daughter noted some family was coming in from [place] to visit. Agreed with concern that patient's time is growing short because his body is tired which patient acknowledges he is "tired". We discussed that currently patient has been receiving all appropraite medical interventions and they are not reversing the underlying process of cancer which we cannot fix. Noted that his body will likely continue to become increasing tired in light of this. Took this time to explain that patient's body already showing signs of this including that patient now unable to tolerate oral liquids/food due to desaturations and concerns about aspirations with muscle weakness.  Again explained the possible transition to comfort focused care to discontinue interventions that are no longer focusing on symptom management and provide medications for pain, dyspnea, etc knowing he is at the end of life. Acknowledged difficulties in discussing this difficult transition with realization about death being near. Patient continuous during conversation noted he is "tired" while also stating he's "not ready to die". Again noted we are trying as a medical team to respect his wishes while also acknowledging that one's body can only survive so much as we all have a time to pass. Noted should he decide his body has become too tired, he can always lets care team's know and can then transition to comfort focused care.   Provided emotional support as able. Patient is also faithful so noted chaplain could provide spiritual support if that would be helpful with processing as well. Patient's "church family" has visited and he has found visits from chaplains to be helpful.   Spent time answering questions as able. Thanked patient and daughter for  allowing me to visit with them today. Noted PMT would continue to follow along with patient's medical journey.   Updated IDT including chaplains, PCCM, hospitalist,  and oncgologist regarding conversation.   Objective:   Vital Signs:  BP 126/69   Pulse (!) 119   Temp 97.6 F (36.4 C) (Axillary)   Resp 17   Ht 5\' 9"  (1.753 m)   Wt 62.1 kg   SpO2 97%   BMI 20.22 kg/m   Physical Exam: General: increased work of breathing noted, alert, laying in bed, chronically ill appearing, frail, cachectic HENT: dry mucous membranes Cardiovascular: RRR Respiratory: increased work of breathing noted on HFNC and non re breather  Skin: no rashes or lesions on visible skin Neuro: A&Ox4 Psych: appropriately answers all questions  Imaging: I personally reviewed recent imaging.   Assessment & Plan:   Assessment: Patient is a 72yo male with a PMHx of HTN, HLD, prostate adenocarcinoma, and recent diagnosis of small cell carcinoma either with primary liver diagnosis or is secondary who was admitted on Jul 17, 2023 for management of worsening weakness. During hospitalization patient has received management for septic shock secondary to UTI, acute respiratory failure, and electrolyte imbalances. Oncology and PCCM consulted. Palliative medicine team consulted to assist with complex medical decision making.   Recommendations/Plan: # Complex medical decision making/goals of care:     -Patient and daughter  have heard that patient is not a candidate for further cancer directed therapies. They have also heard that despite aggressive medical interventions, patient's respiratory status continues to worsen and he is likely at end of life. Recommendation has been made to transition to comfort focused care/hospice support. Patient continues to endorse he is "tired" though then also stating he's "not ready to die". Continuing appropraite medical interventions at this time. PMT continues to engage in conversation and provide support as able.                 -Patient already gave permission to that his daughter should be called first with updates and then his friend/girlfriend, [name].                  -  Code Status: Limited: Do not attempt resuscitation (DNR) -DNR-LIMITED -Do Not Intubate/DNI     # Symptom management                -As per PCCM   # Psycho-social/Spiritual Support:  - Support System: daughter (primary contact/NOK), friend/girlfriend, reported multiple family members visiting from [place] later today  -Reached out to chaplains about spiritual support  # Discharge Planning: To Be Determined  Discussed with: patient, patient's daughter, oncologist, hospitalist, RN, chaplain  Thank you for allowing the palliative care team to participate in the care [facility]  Alvester Morin, DO Palliative Care Provider PMT # [phone]  If patient remains symptomatic despite maximum doses, please call PMT at [phone] between 0700 and 1900. Outside of these hours, please call attending, as PMT does not have night coverage.  Personally spent 56 minutes in patient care including extensive chart review (labs, imaging, progress/consult notes, vital signs), medically appropraite exam, discussed with treatment team, education to patient, family, and staff, documenting clinical information, medication review and management, coordination of care, and available advanced directive documents.   *Please note that this is a verbal dictation therefore any spelling or grammatical errors are due to the "[employer]agon [employer] One" system interpretation.

## 2023-07-29 NOTE — Plan of Care (Signed)
  Problem: Nutritional: Goal: Maintenance of adequate nutrition will improve Outcome: Progressing   Problem: Pain Managment: Goal: General experience of comfort will improve Outcome: Progressing   Problem: Bowel/Gastric: Goal: Will not experience complications related to bowel motility Outcome: Progressing

## 2023-07-29 NOTE — Progress Notes (Addendum)
2. Near-complete opacification of the frontal  sinuses, maxillary sinuses and right sphenoid sinus. Electronically Signed   By: Orvan Falconer M.D.   On: 06/30/2023 21:43   US Abdomen Limited RUQ (LIVER/GB)  Result Date: 06/30/2023 CLINICAL DATA:  Elevated liver enzymes EXAM: ULTRASOUND ABDOMEN LIMITED RIGHT UPPER QUADRANT COMPARISON:  None Available. FINDINGS: Gallbladder: No gallstones or wall thickening visualized. No sonographic Murphy sign noted by sonographer. Common bile duct: Diameter: 0.2 cm Liver: Scattered mostly echogenic nodules are present throughout the liver. These were not present the CT of 10/30/2018. Some have a targetoid appearance as on image 37 of series 1. Appearance suspicious for diffuse hepatic metastatic disease. Portal vein is patent on color Doppler imaging with normal direction of blood flow towards the liver. Other: None. IMPRESSION: 1. Numerous echogenic nodules throughout the liver, suspicious for diffuse hepatic metastatic disease. Consider further workup such as CT of the chest, abdomen, pelvis with contrast for further characterization and to assess for potential primary. PET-CT could also be an alternative imaging choice or adjunct. The patient has a history of prostate cancer but this would be an unusual metastatic pathway for prostate cancer, and a gastrointestinal primary or liver primary would be more common. The possibility of macronodular cirrhosis is not entirely discarded but seems less likely given the appearance. Electronically Signed   By: Gaylyn Rong M.D.   On: 06/30/2023 13:45   ECHOCARDIOGRAM COMPLETE  Result Date: 06/30/2023    ECHOCARDIOGRAM REPORT   Patient Name:   William Jones Date of Exam: 06/30/2023 Medical Rec #:  [id number]             Height:       68.0 in Accession #:    [id number]            Weight:       175.7 lb Date of Birth:  1950/11/19              BSA:          1.934 m Patient Age:    72 years              BP:           146/71 mmHg Patient Gender: M                     HR:            71 bpm. Exam Location:  Inpatient Procedure: 2D Echo, Cardiac Doppler and Color Doppler Indications:    R06.9 DOE; R60.0 Lower extremity edema; R53.83 Fatigue  History:        Patient has no prior history of Echocardiogram examinations.                 Signs/Symptoms:Dyspnea, Edema and Fatigue; Risk                 Factors:Hypertension, Diabetes, Former Smoker, History of                 substance abuse. and Dyslipidemia. Patient denies chest pain. He                 does have DOE, leg edema and extreme fatigue since 06/06/2023.  Sonographer:    Carlos American RVT, RDCS (AE), RDMS Referring Phys: [id number] EMILY B MULLEN  Sonographer Comments: Suboptimal apical window. Image acquisition challenging due to respiratory motion. IMPRESSIONS  1. Left ventricular ejection fraction, by estimation, is 60 to 65%. The left ventricle has normal  2. Near-complete opacification of the frontal  sinuses, maxillary sinuses and right sphenoid sinus. Electronically Signed   By: Orvan Falconer M.D.   On: 06/30/2023 21:43   US Abdomen Limited RUQ (LIVER/GB)  Result Date: 06/30/2023 CLINICAL DATA:  Elevated liver enzymes EXAM: ULTRASOUND ABDOMEN LIMITED RIGHT UPPER QUADRANT COMPARISON:  None Available. FINDINGS: Gallbladder: No gallstones or wall thickening visualized. No sonographic Murphy sign noted by sonographer. Common bile duct: Diameter: 0.2 cm Liver: Scattered mostly echogenic nodules are present throughout the liver. These were not present the CT of 10/30/2018. Some have a targetoid appearance as on image 37 of series 1. Appearance suspicious for diffuse hepatic metastatic disease. Portal vein is patent on color Doppler imaging with normal direction of blood flow towards the liver. Other: None. IMPRESSION: 1. Numerous echogenic nodules throughout the liver, suspicious for diffuse hepatic metastatic disease. Consider further workup such as CT of the chest, abdomen, pelvis with contrast for further characterization and to assess for potential primary. PET-CT could also be an alternative imaging choice or adjunct. The patient has a history of prostate cancer but this would be an unusual metastatic pathway for prostate cancer, and a gastrointestinal primary or liver primary would be more common. The possibility of macronodular cirrhosis is not entirely discarded but seems less likely given the appearance. Electronically Signed   By: Gaylyn Rong M.D.   On: 06/30/2023 13:45   ECHOCARDIOGRAM COMPLETE  Result Date: 06/30/2023    ECHOCARDIOGRAM REPORT   Patient Name:   William Jones Date of Exam: 06/30/2023 Medical Rec #:  [id number]             Height:       68.0 in Accession #:    [id number]            Weight:       175.7 lb Date of Birth:  1950/11/19              BSA:          1.934 m Patient Age:    72 years              BP:           146/71 mmHg Patient Gender: M                     HR:            71 bpm. Exam Location:  Inpatient Procedure: 2D Echo, Cardiac Doppler and Color Doppler Indications:    R06.9 DOE; R60.0 Lower extremity edema; R53.83 Fatigue  History:        Patient has no prior history of Echocardiogram examinations.                 Signs/Symptoms:Dyspnea, Edema and Fatigue; Risk                 Factors:Hypertension, Diabetes, Former Smoker, History of                 substance abuse. and Dyslipidemia. Patient denies chest pain. He                 does have DOE, leg edema and extreme fatigue since 06/06/2023.  Sonographer:    Carlos American RVT, RDCS (AE), RDMS Referring Phys: [id number] EMILY B MULLEN  Sonographer Comments: Suboptimal apical window. Image acquisition challenging due to respiratory motion. IMPRESSIONS  1. Left ventricular ejection fraction, by estimation, is 60 to 65%. The left ventricle has normal  that we know."       Current Stressors: "I don't have any"       Religious / Personal Beliefs: "Holiness"       L. Leward Quan, RN, BSN       Social Determinants of Health   Financial Resource Strain: Low Risk  (06/25/2020)   Overall Financial Resource Strain (CARDIA)    Difficulty of Paying Living Expenses: Not hard at all  Food Insecurity: No Food Insecurity (06/30/2023)   Hunger Vital Sign    Worried About Running Out of Food in the Last Year: Never true    Ran Out of Food in the Last Year: Never true  Transportation Needs: No Transportation Needs (06/30/2023)   PRAPARE - Administrator, Civil Service (Medical): No    Lack of Transportation (Non-Medical): No  Physical Activity: Sufficiently Active (06/25/2020)   Exercise Vital Sign    Days of Exercise per Week: 5 days    Minutes of Exercise per Session: 150+ min  Stress: No Stress Concern Present (10/20/2022)   [employer] of Occupational Health -  Occupational Stress Questionnaire    Feeling of Stress : Not at all  Social Connections: Moderately Integrated (06/25/2020)   Social Connection and Isolation Panel [NHANES]    Frequency of Communication with Friends and Family: More than three times a week    Frequency of Social Gatherings with Friends and Family: Twice a week    Attends Religious Services: More than 4 times per year    Active Member of [employer] or Organizations: No    Attends Banker Meetings: Never    Marital Status: Living with partner  Intimate Partner Violence: Not At Risk (06/30/2023)   Humiliation, Afraid, Rape, and Kick questionnaire    Fear of Current or Ex-Partner: No    Emotionally Abused: No    Physically Abused: No    Sexually Abused: No    BP 126/69   Pulse (!) 119   Temp 98 F (36.7 C) (Axillary)   Resp 17   Ht 5\' 9"  (1.753 m)   Wt 136 lb 14.5 oz (62.1 kg)   SpO2 97%   BMI 20.22 kg/m   He appears uncomfortable, on high oxygen requirement. He tells me that he is tired.  Lab Results  Component Value Date   WBC 8.3 07/29/2023   HGB 8.8 (L) 07/29/2023   HCT 28.2 (L) 07/29/2023   PLT 11 (LL) 07/29/2023   GLUCOSE 333 (H) 07/29/2023   CHOL 187 03/19/2022   TRIG 56 03/19/2022   HDL 52 03/19/2022   LDLCALC 124 (H) 03/19/2022   ALT 64 (H) 07/26/2023   AST 39 07/26/2023   NA 141 07/29/2023   K 3.6 07/29/2023   CL 103 07/29/2023   CREATININE 0.62 07/29/2023   BUN 26 (H) 07/29/2023   CO2 27 07/29/2023    ECHOCARDIOGRAM COMPLETE  Result Date: 07/28/2023    ECHOCARDIOGRAM LIMITED REPORT   Patient Name:   William Jones Date of Exam: 07/28/2023 Medical Rec #:  [id number]             Height:       69.0 in Accession #:    [id number]            Weight:       137.1 lb Date of Birth:  [birth year]              BSA:          1.760 m  that we know."       Current Stressors: "I don't have any"       Religious / Personal Beliefs: "Holiness"       L. Leward Quan, RN, BSN       Social Determinants of Health   Financial Resource Strain: Low Risk  (06/25/2020)   Overall Financial Resource Strain (CARDIA)    Difficulty of Paying Living Expenses: Not hard at all  Food Insecurity: No Food Insecurity (06/30/2023)   Hunger Vital Sign    Worried About Running Out of Food in the Last Year: Never true    Ran Out of Food in the Last Year: Never true  Transportation Needs: No Transportation Needs (06/30/2023)   PRAPARE - Administrator, Civil Service (Medical): No    Lack of Transportation (Non-Medical): No  Physical Activity: Sufficiently Active (06/25/2020)   Exercise Vital Sign    Days of Exercise per Week: 5 days    Minutes of Exercise per Session: 150+ min  Stress: No Stress Concern Present (10/20/2022)   [employer] of Occupational Health -  Occupational Stress Questionnaire    Feeling of Stress : Not at all  Social Connections: Moderately Integrated (06/25/2020)   Social Connection and Isolation Panel [NHANES]    Frequency of Communication with Friends and Family: More than three times a week    Frequency of Social Gatherings with Friends and Family: Twice a week    Attends Religious Services: More than 4 times per year    Active Member of [employer] or Organizations: No    Attends Banker Meetings: Never    Marital Status: Living with partner  Intimate Partner Violence: Not At Risk (06/30/2023)   Humiliation, Afraid, Rape, and Kick questionnaire    Fear of Current or Ex-Partner: No    Emotionally Abused: No    Physically Abused: No    Sexually Abused: No    BP 126/69   Pulse (!) 119   Temp 98 F (36.7 C) (Axillary)   Resp 17   Ht 5\' 9"  (1.753 m)   Wt 136 lb 14.5 oz (62.1 kg)   SpO2 97%   BMI 20.22 kg/m   He appears uncomfortable, on high oxygen requirement. He tells me that he is tired.  Lab Results  Component Value Date   WBC 8.3 07/29/2023   HGB 8.8 (L) 07/29/2023   HCT 28.2 (L) 07/29/2023   PLT 11 (LL) 07/29/2023   GLUCOSE 333 (H) 07/29/2023   CHOL 187 03/19/2022   TRIG 56 03/19/2022   HDL 52 03/19/2022   LDLCALC 124 (H) 03/19/2022   ALT 64 (H) 07/26/2023   AST 39 07/26/2023   NA 141 07/29/2023   K 3.6 07/29/2023   CL 103 07/29/2023   CREATININE 0.62 07/29/2023   BUN 26 (H) 07/29/2023   CO2 27 07/29/2023    ECHOCARDIOGRAM COMPLETE  Result Date: 07/28/2023    ECHOCARDIOGRAM LIMITED REPORT   Patient Name:   William Jones Date of Exam: 07/28/2023 Medical Rec #:  [id number]             Height:       69.0 in Accession #:    [id number]            Weight:       137.1 lb Date of Birth:  [birth year]              BSA:          1.760 m  REPORT   Patient Name:   William Jones Date of Exam: 06/30/2023 Medical Rec #:  [id number]             Height:       68.0 in Accession #:    [id number]            Weight:       175.7 lb Date of Birth:  1951-03-22              BSA:          1.934 m Patient Age:    72 years              BP:           146/71 mmHg Patient Gender: M                     HR:           71 bpm. Exam Location:  Inpatient Procedure: 2D Echo, Cardiac Doppler and Color Doppler Indications:    R06.9 DOE; R60.0 Lower extremity edema; R53.83 Fatigue  History:        Patient has no prior history of Echocardiogram examinations.                 Signs/Symptoms:Dyspnea, Edema and Fatigue; Risk                 Factors:Hypertension, Diabetes, Former Smoker, History of                 substance abuse. and Dyslipidemia. Patient denies chest pain. He                 does have DOE, leg edema and extreme fatigue since 06/06/2023.  Sonographer:    Carlos American RVT, RDCS (AE), RDMS Referring Phys: [id number] EMILY B MULLEN  Sonographer Comments: Suboptimal apical window. Image acquisition challenging due to respiratory motion. IMPRESSIONS  1. Left ventricular ejection fraction, by estimation, is 60 to 65%. The left ventricle has normal function. The left ventricle has no regional wall motion abnormalities. There is mild concentric left ventricular hypertrophy. Left  ventricular diastolic parameters are consistent with Grade I diastolic dysfunction (impaired relaxation).  2. Right ventricular systolic function is normal. The right ventricular size is normal.  3. The mitral valve is normal in structure. Trivial mitral valve regurgitation. No evidence of mitral stenosis.  4. The aortic valve is normal in structure. Aortic valve regurgitation is not visualized. Aortic valve sclerosis/calcification is present, without any evidence of aortic stenosis.  5. The inferior vena cava is normal in size with greater than 50% respiratory variability, suggesting right atrial pressure of 3 mmHg. FINDINGS  Left Ventricle: Left ventricular ejection fraction, by estimation, is 60 to 65%. The left ventricle has normal function. The left ventricle has no regional wall motion abnormalities. The left ventricular internal cavity size was normal in size. There is  mild concentric left ventricular hypertrophy. Left ventricular diastolic parameters are consistent with Grade I diastolic dysfunction (impaired relaxation). Right Ventricle: The right ventricular size is normal. No increase in right ventricular wall thickness. Right ventricular systolic function is normal. Left Atrium: Left atrial size was normal in size. Right Atrium: Right atrial size was normal in size. Pericardium: There is no evidence of pericardial effusion. Mitral Valve: The mitral valve is normal in structure. Trivial mitral valve regurgitation. No evidence of mitral valve stenosis. Tricuspid Valve: The tricuspid valve is normal in structure. Tricuspid valve regurgitation is trivial.  REPORT   Patient Name:   William Jones Date of Exam: 06/30/2023 Medical Rec #:  [id number]             Height:       68.0 in Accession #:    [id number]            Weight:       175.7 lb Date of Birth:  1951-03-22              BSA:          1.934 m Patient Age:    72 years              BP:           146/71 mmHg Patient Gender: M                     HR:           71 bpm. Exam Location:  Inpatient Procedure: 2D Echo, Cardiac Doppler and Color Doppler Indications:    R06.9 DOE; R60.0 Lower extremity edema; R53.83 Fatigue  History:        Patient has no prior history of Echocardiogram examinations.                 Signs/Symptoms:Dyspnea, Edema and Fatigue; Risk                 Factors:Hypertension, Diabetes, Former Smoker, History of                 substance abuse. and Dyslipidemia. Patient denies chest pain. He                 does have DOE, leg edema and extreme fatigue since 06/06/2023.  Sonographer:    Carlos American RVT, RDCS (AE), RDMS Referring Phys: [id number] EMILY B MULLEN  Sonographer Comments: Suboptimal apical window. Image acquisition challenging due to respiratory motion. IMPRESSIONS  1. Left ventricular ejection fraction, by estimation, is 60 to 65%. The left ventricle has normal function. The left ventricle has no regional wall motion abnormalities. There is mild concentric left ventricular hypertrophy. Left  ventricular diastolic parameters are consistent with Grade I diastolic dysfunction (impaired relaxation).  2. Right ventricular systolic function is normal. The right ventricular size is normal.  3. The mitral valve is normal in structure. Trivial mitral valve regurgitation. No evidence of mitral stenosis.  4. The aortic valve is normal in structure. Aortic valve regurgitation is not visualized. Aortic valve sclerosis/calcification is present, without any evidence of aortic stenosis.  5. The inferior vena cava is normal in size with greater than 50% respiratory variability, suggesting right atrial pressure of 3 mmHg. FINDINGS  Left Ventricle: Left ventricular ejection fraction, by estimation, is 60 to 65%. The left ventricle has normal function. The left ventricle has no regional wall motion abnormalities. The left ventricular internal cavity size was normal in size. There is  mild concentric left ventricular hypertrophy. Left ventricular diastolic parameters are consistent with Grade I diastolic dysfunction (impaired relaxation). Right Ventricle: The right ventricular size is normal. No increase in right ventricular wall thickness. Right ventricular systolic function is normal. Left Atrium: Left atrial size was normal in size. Right Atrium: Right atrial size was normal in size. Pericardium: There is no evidence of pericardial effusion. Mitral Valve: The mitral valve is normal in structure. Trivial mitral valve regurgitation. No evidence of mitral valve stenosis. Tricuspid Valve: The tricuspid valve is normal in structure. Tricuspid valve regurgitation is trivial.  that we know."       Current Stressors: "I don't have any"       Religious / Personal Beliefs: "Holiness"       L. Leward Quan, RN, BSN       Social Determinants of Health   Financial Resource Strain: Low Risk  (06/25/2020)   Overall Financial Resource Strain (CARDIA)    Difficulty of Paying Living Expenses: Not hard at all  Food Insecurity: No Food Insecurity (06/30/2023)   Hunger Vital Sign    Worried About Running Out of Food in the Last Year: Never true    Ran Out of Food in the Last Year: Never true  Transportation Needs: No Transportation Needs (06/30/2023)   PRAPARE - Administrator, Civil Service (Medical): No    Lack of Transportation (Non-Medical): No  Physical Activity: Sufficiently Active (06/25/2020)   Exercise Vital Sign    Days of Exercise per Week: 5 days    Minutes of Exercise per Session: 150+ min  Stress: No Stress Concern Present (10/20/2022)   [employer] of Occupational Health -  Occupational Stress Questionnaire    Feeling of Stress : Not at all  Social Connections: Moderately Integrated (06/25/2020)   Social Connection and Isolation Panel [NHANES]    Frequency of Communication with Friends and Family: More than three times a week    Frequency of Social Gatherings with Friends and Family: Twice a week    Attends Religious Services: More than 4 times per year    Active Member of [employer] or Organizations: No    Attends Banker Meetings: Never    Marital Status: Living with partner  Intimate Partner Violence: Not At Risk (06/30/2023)   Humiliation, Afraid, Rape, and Kick questionnaire    Fear of Current or Ex-Partner: No    Emotionally Abused: No    Physically Abused: No    Sexually Abused: No    BP 126/69   Pulse (!) 119   Temp 98 F (36.7 C) (Axillary)   Resp 17   Ht 5\' 9"  (1.753 m)   Wt 136 lb 14.5 oz (62.1 kg)   SpO2 97%   BMI 20.22 kg/m   He appears uncomfortable, on high oxygen requirement. He tells me that he is tired.  Lab Results  Component Value Date   WBC 8.3 07/29/2023   HGB 8.8 (L) 07/29/2023   HCT 28.2 (L) 07/29/2023   PLT 11 (LL) 07/29/2023   GLUCOSE 333 (H) 07/29/2023   CHOL 187 03/19/2022   TRIG 56 03/19/2022   HDL 52 03/19/2022   LDLCALC 124 (H) 03/19/2022   ALT 64 (H) 07/26/2023   AST 39 07/26/2023   NA 141 07/29/2023   K 3.6 07/29/2023   CL 103 07/29/2023   CREATININE 0.62 07/29/2023   BUN 26 (H) 07/29/2023   CO2 27 07/29/2023    ECHOCARDIOGRAM COMPLETE  Result Date: 07/28/2023    ECHOCARDIOGRAM LIMITED REPORT   Patient Name:   William Jones Date of Exam: 07/28/2023 Medical Rec #:  [id number]             Height:       69.0 in Accession #:    [id number]            Weight:       137.1 lb Date of Birth:  [birth year]              BSA:          1.760 m  REPORT   Patient Name:   William Jones Date of Exam: 06/30/2023 Medical Rec #:  [id number]             Height:       68.0 in Accession #:    [id number]            Weight:       175.7 lb Date of Birth:  1951-03-22              BSA:          1.934 m Patient Age:    72 years              BP:           146/71 mmHg Patient Gender: M                     HR:           71 bpm. Exam Location:  Inpatient Procedure: 2D Echo, Cardiac Doppler and Color Doppler Indications:    R06.9 DOE; R60.0 Lower extremity edema; R53.83 Fatigue  History:        Patient has no prior history of Echocardiogram examinations.                 Signs/Symptoms:Dyspnea, Edema and Fatigue; Risk                 Factors:Hypertension, Diabetes, Former Smoker, History of                 substance abuse. and Dyslipidemia. Patient denies chest pain. He                 does have DOE, leg edema and extreme fatigue since 06/06/2023.  Sonographer:    Carlos American RVT, RDCS (AE), RDMS Referring Phys: [id number] EMILY B MULLEN  Sonographer Comments: Suboptimal apical window. Image acquisition challenging due to respiratory motion. IMPRESSIONS  1. Left ventricular ejection fraction, by estimation, is 60 to 65%. The left ventricle has normal function. The left ventricle has no regional wall motion abnormalities. There is mild concentric left ventricular hypertrophy. Left  ventricular diastolic parameters are consistent with Grade I diastolic dysfunction (impaired relaxation).  2. Right ventricular systolic function is normal. The right ventricular size is normal.  3. The mitral valve is normal in structure. Trivial mitral valve regurgitation. No evidence of mitral stenosis.  4. The aortic valve is normal in structure. Aortic valve regurgitation is not visualized. Aortic valve sclerosis/calcification is present, without any evidence of aortic stenosis.  5. The inferior vena cava is normal in size with greater than 50% respiratory variability, suggesting right atrial pressure of 3 mmHg. FINDINGS  Left Ventricle: Left ventricular ejection fraction, by estimation, is 60 to 65%. The left ventricle has normal function. The left ventricle has no regional wall motion abnormalities. The left ventricular internal cavity size was normal in size. There is  mild concentric left ventricular hypertrophy. Left ventricular diastolic parameters are consistent with Grade I diastolic dysfunction (impaired relaxation). Right Ventricle: The right ventricular size is normal. No increase in right ventricular wall thickness. Right ventricular systolic function is normal. Left Atrium: Left atrial size was normal in size. Right Atrium: Right atrial size was normal in size. Pericardium: There is no evidence of pericardial effusion. Mitral Valve: The mitral valve is normal in structure. Trivial mitral valve regurgitation. No evidence of mitral valve stenosis. Tricuspid Valve: The tricuspid valve is normal in structure. Tricuspid valve regurgitation is trivial.  2. Near-complete opacification of the frontal  sinuses, maxillary sinuses and right sphenoid sinus. Electronically Signed   By: Orvan Falconer M.D.   On: 06/30/2023 21:43   US Abdomen Limited RUQ (LIVER/GB)  Result Date: 06/30/2023 CLINICAL DATA:  Elevated liver enzymes EXAM: ULTRASOUND ABDOMEN LIMITED RIGHT UPPER QUADRANT COMPARISON:  None Available. FINDINGS: Gallbladder: No gallstones or wall thickening visualized. No sonographic Murphy sign noted by sonographer. Common bile duct: Diameter: 0.2 cm Liver: Scattered mostly echogenic nodules are present throughout the liver. These were not present the CT of 10/30/2018. Some have a targetoid appearance as on image 37 of series 1. Appearance suspicious for diffuse hepatic metastatic disease. Portal vein is patent on color Doppler imaging with normal direction of blood flow towards the liver. Other: None. IMPRESSION: 1. Numerous echogenic nodules throughout the liver, suspicious for diffuse hepatic metastatic disease. Consider further workup such as CT of the chest, abdomen, pelvis with contrast for further characterization and to assess for potential primary. PET-CT could also be an alternative imaging choice or adjunct. The patient has a history of prostate cancer but this would be an unusual metastatic pathway for prostate cancer, and a gastrointestinal primary or liver primary would be more common. The possibility of macronodular cirrhosis is not entirely discarded but seems less likely given the appearance. Electronically Signed   By: Gaylyn Rong M.D.   On: 06/30/2023 13:45   ECHOCARDIOGRAM COMPLETE  Result Date: 06/30/2023    ECHOCARDIOGRAM REPORT   Patient Name:   William Jones Date of Exam: 06/30/2023 Medical Rec #:  [id number]             Height:       68.0 in Accession #:    [id number]            Weight:       175.7 lb Date of Birth:  1950/11/19              BSA:          1.934 m Patient Age:    72 years              BP:           146/71 mmHg Patient Gender: M                     HR:            71 bpm. Exam Location:  Inpatient Procedure: 2D Echo, Cardiac Doppler and Color Doppler Indications:    R06.9 DOE; R60.0 Lower extremity edema; R53.83 Fatigue  History:        Patient has no prior history of Echocardiogram examinations.                 Signs/Symptoms:Dyspnea, Edema and Fatigue; Risk                 Factors:Hypertension, Diabetes, Former Smoker, History of                 substance abuse. and Dyslipidemia. Patient denies chest pain. He                 does have DOE, leg edema and extreme fatigue since 06/06/2023.  Sonographer:    Carlos American RVT, RDCS (AE), RDMS Referring Phys: [id number] EMILY B MULLEN  Sonographer Comments: Suboptimal apical window. Image acquisition challenging due to respiratory motion. IMPRESSIONS  1. Left ventricular ejection fraction, by estimation, is 60 to 65%. The left ventricle has normal  that we know."       Current Stressors: "I don't have any"       Religious / Personal Beliefs: "Holiness"       L. Leward Quan, RN, BSN       Social Determinants of Health   Financial Resource Strain: Low Risk  (06/25/2020)   Overall Financial Resource Strain (CARDIA)    Difficulty of Paying Living Expenses: Not hard at all  Food Insecurity: No Food Insecurity (06/30/2023)   Hunger Vital Sign    Worried About Running Out of Food in the Last Year: Never true    Ran Out of Food in the Last Year: Never true  Transportation Needs: No Transportation Needs (06/30/2023)   PRAPARE - Administrator, Civil Service (Medical): No    Lack of Transportation (Non-Medical): No  Physical Activity: Sufficiently Active (06/25/2020)   Exercise Vital Sign    Days of Exercise per Week: 5 days    Minutes of Exercise per Session: 150+ min  Stress: No Stress Concern Present (10/20/2022)   [employer] of Occupational Health -  Occupational Stress Questionnaire    Feeling of Stress : Not at all  Social Connections: Moderately Integrated (06/25/2020)   Social Connection and Isolation Panel [NHANES]    Frequency of Communication with Friends and Family: More than three times a week    Frequency of Social Gatherings with Friends and Family: Twice a week    Attends Religious Services: More than 4 times per year    Active Member of [employer] or Organizations: No    Attends Banker Meetings: Never    Marital Status: Living with partner  Intimate Partner Violence: Not At Risk (06/30/2023)   Humiliation, Afraid, Rape, and Kick questionnaire    Fear of Current or Ex-Partner: No    Emotionally Abused: No    Physically Abused: No    Sexually Abused: No    BP 126/69   Pulse (!) 119   Temp 98 F (36.7 C) (Axillary)   Resp 17   Ht 5\' 9"  (1.753 m)   Wt 136 lb 14.5 oz (62.1 kg)   SpO2 97%   BMI 20.22 kg/m   He appears uncomfortable, on high oxygen requirement. He tells me that he is tired.  Lab Results  Component Value Date   WBC 8.3 07/29/2023   HGB 8.8 (L) 07/29/2023   HCT 28.2 (L) 07/29/2023   PLT 11 (LL) 07/29/2023   GLUCOSE 333 (H) 07/29/2023   CHOL 187 03/19/2022   TRIG 56 03/19/2022   HDL 52 03/19/2022   LDLCALC 124 (H) 03/19/2022   ALT 64 (H) 07/26/2023   AST 39 07/26/2023   NA 141 07/29/2023   K 3.6 07/29/2023   CL 103 07/29/2023   CREATININE 0.62 07/29/2023   BUN 26 (H) 07/29/2023   CO2 27 07/29/2023    ECHOCARDIOGRAM COMPLETE  Result Date: 07/28/2023    ECHOCARDIOGRAM LIMITED REPORT   Patient Name:   William Jones Date of Exam: 07/28/2023 Medical Rec #:  [id number]             Height:       69.0 in Accession #:    [id number]            Weight:       137.1 lb Date of Birth:  [birth year]              BSA:          1.760 m  REPORT   Patient Name:   William Jones Date of Exam: 06/30/2023 Medical Rec #:  [id number]             Height:       68.0 in Accession #:    [id number]            Weight:       175.7 lb Date of Birth:  1951-03-22              BSA:          1.934 m Patient Age:    72 years              BP:           146/71 mmHg Patient Gender: M                     HR:           71 bpm. Exam Location:  Inpatient Procedure: 2D Echo, Cardiac Doppler and Color Doppler Indications:    R06.9 DOE; R60.0 Lower extremity edema; R53.83 Fatigue  History:        Patient has no prior history of Echocardiogram examinations.                 Signs/Symptoms:Dyspnea, Edema and Fatigue; Risk                 Factors:Hypertension, Diabetes, Former Smoker, History of                 substance abuse. and Dyslipidemia. Patient denies chest pain. He                 does have DOE, leg edema and extreme fatigue since 06/06/2023.  Sonographer:    Carlos American RVT, RDCS (AE), RDMS Referring Phys: [id number] EMILY B MULLEN  Sonographer Comments: Suboptimal apical window. Image acquisition challenging due to respiratory motion. IMPRESSIONS  1. Left ventricular ejection fraction, by estimation, is 60 to 65%. The left ventricle has normal function. The left ventricle has no regional wall motion abnormalities. There is mild concentric left ventricular hypertrophy. Left  ventricular diastolic parameters are consistent with Grade I diastolic dysfunction (impaired relaxation).  2. Right ventricular systolic function is normal. The right ventricular size is normal.  3. The mitral valve is normal in structure. Trivial mitral valve regurgitation. No evidence of mitral stenosis.  4. The aortic valve is normal in structure. Aortic valve regurgitation is not visualized. Aortic valve sclerosis/calcification is present, without any evidence of aortic stenosis.  5. The inferior vena cava is normal in size with greater than 50% respiratory variability, suggesting right atrial pressure of 3 mmHg. FINDINGS  Left Ventricle: Left ventricular ejection fraction, by estimation, is 60 to 65%. The left ventricle has normal function. The left ventricle has no regional wall motion abnormalities. The left ventricular internal cavity size was normal in size. There is  mild concentric left ventricular hypertrophy. Left ventricular diastolic parameters are consistent with Grade I diastolic dysfunction (impaired relaxation). Right Ventricle: The right ventricular size is normal. No increase in right ventricular wall thickness. Right ventricular systolic function is normal. Left Atrium: Left atrial size was normal in size. Right Atrium: Right atrial size was normal in size. Pericardium: There is no evidence of pericardial effusion. Mitral Valve: The mitral valve is normal in structure. Trivial mitral valve regurgitation. No evidence of mitral valve stenosis. Tricuspid Valve: The tricuspid valve is normal in structure. Tricuspid valve regurgitation is trivial.  2. Near-complete opacification of the frontal  sinuses, maxillary sinuses and right sphenoid sinus. Electronically Signed   By: Orvan Falconer M.D.   On: 06/30/2023 21:43   US Abdomen Limited RUQ (LIVER/GB)  Result Date: 06/30/2023 CLINICAL DATA:  Elevated liver enzymes EXAM: ULTRASOUND ABDOMEN LIMITED RIGHT UPPER QUADRANT COMPARISON:  None Available. FINDINGS: Gallbladder: No gallstones or wall thickening visualized. No sonographic Murphy sign noted by sonographer. Common bile duct: Diameter: 0.2 cm Liver: Scattered mostly echogenic nodules are present throughout the liver. These were not present the CT of 10/30/2018. Some have a targetoid appearance as on image 37 of series 1. Appearance suspicious for diffuse hepatic metastatic disease. Portal vein is patent on color Doppler imaging with normal direction of blood flow towards the liver. Other: None. IMPRESSION: 1. Numerous echogenic nodules throughout the liver, suspicious for diffuse hepatic metastatic disease. Consider further workup such as CT of the chest, abdomen, pelvis with contrast for further characterization and to assess for potential primary. PET-CT could also be an alternative imaging choice or adjunct. The patient has a history of prostate cancer but this would be an unusual metastatic pathway for prostate cancer, and a gastrointestinal primary or liver primary would be more common. The possibility of macronodular cirrhosis is not entirely discarded but seems less likely given the appearance. Electronically Signed   By: Gaylyn Rong M.D.   On: 06/30/2023 13:45   ECHOCARDIOGRAM COMPLETE  Result Date: 06/30/2023    ECHOCARDIOGRAM REPORT   Patient Name:   William Jones Date of Exam: 06/30/2023 Medical Rec #:  [id number]             Height:       68.0 in Accession #:    [id number]            Weight:       175.7 lb Date of Birth:  1950/11/19              BSA:          1.934 m Patient Age:    72 years              BP:           146/71 mmHg Patient Gender: M                     HR:            71 bpm. Exam Location:  Inpatient Procedure: 2D Echo, Cardiac Doppler and Color Doppler Indications:    R06.9 DOE; R60.0 Lower extremity edema; R53.83 Fatigue  History:        Patient has no prior history of Echocardiogram examinations.                 Signs/Symptoms:Dyspnea, Edema and Fatigue; Risk                 Factors:Hypertension, Diabetes, Former Smoker, History of                 substance abuse. and Dyslipidemia. Patient denies chest pain. He                 does have DOE, leg edema and extreme fatigue since 06/06/2023.  Sonographer:    Carlos American RVT, RDCS (AE), RDMS Referring Phys: [id number] EMILY B MULLEN  Sonographer Comments: Suboptimal apical window. Image acquisition challenging due to respiratory motion. IMPRESSIONS  1. Left ventricular ejection fraction, by estimation, is 60 to 65%. The left ventricle has normal  that we know."       Current Stressors: "I don't have any"       Religious / Personal Beliefs: "Holiness"       L. Leward Quan, RN, BSN       Social Determinants of Health   Financial Resource Strain: Low Risk  (06/25/2020)   Overall Financial Resource Strain (CARDIA)    Difficulty of Paying Living Expenses: Not hard at all  Food Insecurity: No Food Insecurity (06/30/2023)   Hunger Vital Sign    Worried About Running Out of Food in the Last Year: Never true    Ran Out of Food in the Last Year: Never true  Transportation Needs: No Transportation Needs (06/30/2023)   PRAPARE - Administrator, Civil Service (Medical): No    Lack of Transportation (Non-Medical): No  Physical Activity: Sufficiently Active (06/25/2020)   Exercise Vital Sign    Days of Exercise per Week: 5 days    Minutes of Exercise per Session: 150+ min  Stress: No Stress Concern Present (10/20/2022)   [employer] of Occupational Health -  Occupational Stress Questionnaire    Feeling of Stress : Not at all  Social Connections: Moderately Integrated (06/25/2020)   Social Connection and Isolation Panel [NHANES]    Frequency of Communication with Friends and Family: More than three times a week    Frequency of Social Gatherings with Friends and Family: Twice a week    Attends Religious Services: More than 4 times per year    Active Member of [employer] or Organizations: No    Attends Banker Meetings: Never    Marital Status: Living with partner  Intimate Partner Violence: Not At Risk (06/30/2023)   Humiliation, Afraid, Rape, and Kick questionnaire    Fear of Current or Ex-Partner: No    Emotionally Abused: No    Physically Abused: No    Sexually Abused: No    BP 126/69   Pulse (!) 119   Temp 98 F (36.7 C) (Axillary)   Resp 17   Ht 5\' 9"  (1.753 m)   Wt 136 lb 14.5 oz (62.1 kg)   SpO2 97%   BMI 20.22 kg/m   He appears uncomfortable, on high oxygen requirement. He tells me that he is tired.  Lab Results  Component Value Date   WBC 8.3 07/29/2023   HGB 8.8 (L) 07/29/2023   HCT 28.2 (L) 07/29/2023   PLT 11 (LL) 07/29/2023   GLUCOSE 333 (H) 07/29/2023   CHOL 187 03/19/2022   TRIG 56 03/19/2022   HDL 52 03/19/2022   LDLCALC 124 (H) 03/19/2022   ALT 64 (H) 07/26/2023   AST 39 07/26/2023   NA 141 07/29/2023   K 3.6 07/29/2023   CL 103 07/29/2023   CREATININE 0.62 07/29/2023   BUN 26 (H) 07/29/2023   CO2 27 07/29/2023    ECHOCARDIOGRAM COMPLETE  Result Date: 07/28/2023    ECHOCARDIOGRAM LIMITED REPORT   Patient Name:   William Jones Date of Exam: 07/28/2023 Medical Rec #:  [id number]             Height:       69.0 in Accession #:    [id number]            Weight:       137.1 lb Date of Birth:  [birth year]              BSA:          1.760 m  MV A velocity: 107.50 cm/s  SHUNTS MV E/A ratio:  0.47         Systemic VTI:  0.22 m                             Systemic Diam: 2.10 cm Lavona Mound Tobb DO Electronically signed by Thomasene Ripple DO  Signature Date/Time: 06/30/2023/12:36:13 PM    Final    DG Chest Port 1 View  Result Date: 06/29/2023 CLINICAL DATA:  Weakness EXAM: PORTABLE CHEST 1 VIEW COMPARISON:  None Available. FINDINGS: The heart size and mediastinal contours are within normal limits. Both lungs are clear. The visualized skeletal structures are unremarkable. IMPRESSION: No active disease. Electronically Signed   By: Darliss Cheney M.D.   On: 06/29/2023 23:28    Pathology:small cell carcinoma.  ECHOCARDIOGRAM COMPLETE  Result Date: 07/28/2023    ECHOCARDIOGRAM LIMITED REPORT   Patient Name:   William Jones Date of Exam: 07/28/2023 Medical Rec #:  [id number]             Height:       69.0 in Accession #:    [id number]            Weight:       137.1 lb Date of Birth:  22-Nov-1950              BSA:          1.760 m Patient Age:    72 years              BP:           126/71 mmHg Patient Gender: M                     HR:           109 bpm. Exam Location:  Inpatient Procedure: 2D Echo Indications:    Dyspnea R06.22  History:        Patient has prior history of Echocardiogram examinations, most                 recent 06/30/2023. Risk Factors:Hypertension and Dyslipidemia.  Sonographer:    Harriette Bouillon RDCS Referring Phys: [phone] Clarene Critchley HOFFMAN IMPRESSIONS  1. Hyperdynamic LV with moderate concentric LVH and mid-cavity obliteration. Given significant LVH and aortic valve thickening consideration should be given to a possible infiltrative process (i.e. cardiac amyloid). . Left ventricular ejection fraction,  by estimation, is >75%. The left ventricle has hyperdynamic function. The left ventricle has no regional wall motion abnormalities. There is moderate concentric left ventricular hypertrophy. Left ventricular diastolic parameters are consistent with Grade I diastolic dysfunction (impaired relaxation).  2. Right ventricular systolic function is normal. The right ventricular size is normal.  3. The mitral valve is normal in structure. No  evidence of mitral valve regurgitation. No evidence of mitral stenosis.  4. The aortic valve is tricuspid. There is mild calcification of the aortic valve. Aortic valve regurgitation is mild to moderate. No aortic stenosis is present.  5. The inferior vena cava is normal in size with greater than 50% respiratory variability, suggesting right atrial pressure of 3 mmHg. FINDINGS  Left Ventricle: Hyperdynamic LV with moderate concentric LVH and mid-cavity obliteration. Given significant LVH and aortic valve thickening consideration should be given to a possible infiltrative process (i.e. cardiac amyloid). Left ventricular ejection fraction, by estimation, is >75%. The left ventricle has hyperdynamic function. The left ventricle has no  2. Near-complete opacification of the frontal  sinuses, maxillary sinuses and right sphenoid sinus. Electronically Signed   By: Orvan Falconer M.D.   On: 06/30/2023 21:43   US Abdomen Limited RUQ (LIVER/GB)  Result Date: 06/30/2023 CLINICAL DATA:  Elevated liver enzymes EXAM: ULTRASOUND ABDOMEN LIMITED RIGHT UPPER QUADRANT COMPARISON:  None Available. FINDINGS: Gallbladder: No gallstones or wall thickening visualized. No sonographic Murphy sign noted by sonographer. Common bile duct: Diameter: 0.2 cm Liver: Scattered mostly echogenic nodules are present throughout the liver. These were not present the CT of 10/30/2018. Some have a targetoid appearance as on image 37 of series 1. Appearance suspicious for diffuse hepatic metastatic disease. Portal vein is patent on color Doppler imaging with normal direction of blood flow towards the liver. Other: None. IMPRESSION: 1. Numerous echogenic nodules throughout the liver, suspicious for diffuse hepatic metastatic disease. Consider further workup such as CT of the chest, abdomen, pelvis with contrast for further characterization and to assess for potential primary. PET-CT could also be an alternative imaging choice or adjunct. The patient has a history of prostate cancer but this would be an unusual metastatic pathway for prostate cancer, and a gastrointestinal primary or liver primary would be more common. The possibility of macronodular cirrhosis is not entirely discarded but seems less likely given the appearance. Electronically Signed   By: Gaylyn Rong M.D.   On: 06/30/2023 13:45   ECHOCARDIOGRAM COMPLETE  Result Date: 06/30/2023    ECHOCARDIOGRAM REPORT   Patient Name:   William Jones Date of Exam: 06/30/2023 Medical Rec #:  [id number]             Height:       68.0 in Accession #:    [id number]            Weight:       175.7 lb Date of Birth:  1950/11/19              BSA:          1.934 m Patient Age:    72 years              BP:           146/71 mmHg Patient Gender: M                     HR:            71 bpm. Exam Location:  Inpatient Procedure: 2D Echo, Cardiac Doppler and Color Doppler Indications:    R06.9 DOE; R60.0 Lower extremity edema; R53.83 Fatigue  History:        Patient has no prior history of Echocardiogram examinations.                 Signs/Symptoms:Dyspnea, Edema and Fatigue; Risk                 Factors:Hypertension, Diabetes, Former Smoker, History of                 substance abuse. and Dyslipidemia. Patient denies chest pain. He                 does have DOE, leg edema and extreme fatigue since 06/06/2023.  Sonographer:    Carlos American RVT, RDCS (AE), RDMS Referring Phys: [id number] EMILY B MULLEN  Sonographer Comments: Suboptimal apical window. Image acquisition challenging due to respiratory motion. IMPRESSIONS  1. Left ventricular ejection fraction, by estimation, is 60 to 65%. The left ventricle has normal  MV A velocity: 107.50 cm/s  SHUNTS MV E/A ratio:  0.47         Systemic VTI:  0.22 m                             Systemic Diam: 2.10 cm Lavona Mound Tobb DO Electronically signed by Thomasene Ripple DO  Signature Date/Time: 06/30/2023/12:36:13 PM    Final    DG Chest Port 1 View  Result Date: 06/29/2023 CLINICAL DATA:  Weakness EXAM: PORTABLE CHEST 1 VIEW COMPARISON:  None Available. FINDINGS: The heart size and mediastinal contours are within normal limits. Both lungs are clear. The visualized skeletal structures are unremarkable. IMPRESSION: No active disease. Electronically Signed   By: Darliss Cheney M.D.   On: 06/29/2023 23:28    Pathology:small cell carcinoma.  ECHOCARDIOGRAM COMPLETE  Result Date: 07/28/2023    ECHOCARDIOGRAM LIMITED REPORT   Patient Name:   William Jones Date of Exam: 07/28/2023 Medical Rec #:  [id number]             Height:       69.0 in Accession #:    [id number]            Weight:       137.1 lb Date of Birth:  22-Nov-1950              BSA:          1.760 m Patient Age:    72 years              BP:           126/71 mmHg Patient Gender: M                     HR:           109 bpm. Exam Location:  Inpatient Procedure: 2D Echo Indications:    Dyspnea R06.22  History:        Patient has prior history of Echocardiogram examinations, most                 recent 06/30/2023. Risk Factors:Hypertension and Dyslipidemia.  Sonographer:    Harriette Bouillon RDCS Referring Phys: [phone] Clarene Critchley HOFFMAN IMPRESSIONS  1. Hyperdynamic LV with moderate concentric LVH and mid-cavity obliteration. Given significant LVH and aortic valve thickening consideration should be given to a possible infiltrative process (i.e. cardiac amyloid). . Left ventricular ejection fraction,  by estimation, is >75%. The left ventricle has hyperdynamic function. The left ventricle has no regional wall motion abnormalities. There is moderate concentric left ventricular hypertrophy. Left ventricular diastolic parameters are consistent with Grade I diastolic dysfunction (impaired relaxation).  2. Right ventricular systolic function is normal. The right ventricular size is normal.  3. The mitral valve is normal in structure. No  evidence of mitral valve regurgitation. No evidence of mitral stenosis.  4. The aortic valve is tricuspid. There is mild calcification of the aortic valve. Aortic valve regurgitation is mild to moderate. No aortic stenosis is present.  5. The inferior vena cava is normal in size with greater than 50% respiratory variability, suggesting right atrial pressure of 3 mmHg. FINDINGS  Left Ventricle: Hyperdynamic LV with moderate concentric LVH and mid-cavity obliteration. Given significant LVH and aortic valve thickening consideration should be given to a possible infiltrative process (i.e. cardiac amyloid). Left ventricular ejection fraction, by estimation, is >75%. The left ventricle has hyperdynamic function. The left ventricle has no  2. Near-complete opacification of the frontal  sinuses, maxillary sinuses and right sphenoid sinus. Electronically Signed   By: Orvan Falconer M.D.   On: 06/30/2023 21:43   US Abdomen Limited RUQ (LIVER/GB)  Result Date: 06/30/2023 CLINICAL DATA:  Elevated liver enzymes EXAM: ULTRASOUND ABDOMEN LIMITED RIGHT UPPER QUADRANT COMPARISON:  None Available. FINDINGS: Gallbladder: No gallstones or wall thickening visualized. No sonographic Murphy sign noted by sonographer. Common bile duct: Diameter: 0.2 cm Liver: Scattered mostly echogenic nodules are present throughout the liver. These were not present the CT of 10/30/2018. Some have a targetoid appearance as on image 37 of series 1. Appearance suspicious for diffuse hepatic metastatic disease. Portal vein is patent on color Doppler imaging with normal direction of blood flow towards the liver. Other: None. IMPRESSION: 1. Numerous echogenic nodules throughout the liver, suspicious for diffuse hepatic metastatic disease. Consider further workup such as CT of the chest, abdomen, pelvis with contrast for further characterization and to assess for potential primary. PET-CT could also be an alternative imaging choice or adjunct. The patient has a history of prostate cancer but this would be an unusual metastatic pathway for prostate cancer, and a gastrointestinal primary or liver primary would be more common. The possibility of macronodular cirrhosis is not entirely discarded but seems less likely given the appearance. Electronically Signed   By: Gaylyn Rong M.D.   On: 06/30/2023 13:45   ECHOCARDIOGRAM COMPLETE  Result Date: 06/30/2023    ECHOCARDIOGRAM REPORT   Patient Name:   William Jones Date of Exam: 06/30/2023 Medical Rec #:  [id number]             Height:       68.0 in Accession #:    [id number]            Weight:       175.7 lb Date of Birth:  1950/11/19              BSA:          1.934 m Patient Age:    72 years              BP:           146/71 mmHg Patient Gender: M                     HR:            71 bpm. Exam Location:  Inpatient Procedure: 2D Echo, Cardiac Doppler and Color Doppler Indications:    R06.9 DOE; R60.0 Lower extremity edema; R53.83 Fatigue  History:        Patient has no prior history of Echocardiogram examinations.                 Signs/Symptoms:Dyspnea, Edema and Fatigue; Risk                 Factors:Hypertension, Diabetes, Former Smoker, History of                 substance abuse. and Dyslipidemia. Patient denies chest pain. He                 does have DOE, leg edema and extreme fatigue since 06/06/2023.  Sonographer:    Carlos American RVT, RDCS (AE), RDMS Referring Phys: [id number] EMILY B MULLEN  Sonographer Comments: Suboptimal apical window. Image acquisition challenging due to respiratory motion. IMPRESSIONS  1. Left ventricular ejection fraction, by estimation, is 60 to 65%. The left ventricle has normal  MV A velocity: 107.50 cm/s  SHUNTS MV E/A ratio:  0.47         Systemic VTI:  0.22 m                             Systemic Diam: 2.10 cm Lavona Mound Tobb DO Electronically signed by Thomasene Ripple DO  Signature Date/Time: 06/30/2023/12:36:13 PM    Final    DG Chest Port 1 View  Result Date: 06/29/2023 CLINICAL DATA:  Weakness EXAM: PORTABLE CHEST 1 VIEW COMPARISON:  None Available. FINDINGS: The heart size and mediastinal contours are within normal limits. Both lungs are clear. The visualized skeletal structures are unremarkable. IMPRESSION: No active disease. Electronically Signed   By: Darliss Cheney M.D.   On: 06/29/2023 23:28    Pathology:small cell carcinoma.  ECHOCARDIOGRAM COMPLETE  Result Date: 07/28/2023    ECHOCARDIOGRAM LIMITED REPORT   Patient Name:   William Jones Date of Exam: 07/28/2023 Medical Rec #:  [id number]             Height:       69.0 in Accession #:    [id number]            Weight:       137.1 lb Date of Birth:  22-Nov-1950              BSA:          1.760 m Patient Age:    72 years              BP:           126/71 mmHg Patient Gender: M                     HR:           109 bpm. Exam Location:  Inpatient Procedure: 2D Echo Indications:    Dyspnea R06.22  History:        Patient has prior history of Echocardiogram examinations, most                 recent 06/30/2023. Risk Factors:Hypertension and Dyslipidemia.  Sonographer:    Harriette Bouillon RDCS Referring Phys: [phone] Clarene Critchley HOFFMAN IMPRESSIONS  1. Hyperdynamic LV with moderate concentric LVH and mid-cavity obliteration. Given significant LVH and aortic valve thickening consideration should be given to a possible infiltrative process (i.e. cardiac amyloid). . Left ventricular ejection fraction,  by estimation, is >75%. The left ventricle has hyperdynamic function. The left ventricle has no regional wall motion abnormalities. There is moderate concentric left ventricular hypertrophy. Left ventricular diastolic parameters are consistent with Grade I diastolic dysfunction (impaired relaxation).  2. Right ventricular systolic function is normal. The right ventricular size is normal.  3. The mitral valve is normal in structure. No  evidence of mitral valve regurgitation. No evidence of mitral stenosis.  4. The aortic valve is tricuspid. There is mild calcification of the aortic valve. Aortic valve regurgitation is mild to moderate. No aortic stenosis is present.  5. The inferior vena cava is normal in size with greater than 50% respiratory variability, suggesting right atrial pressure of 3 mmHg. FINDINGS  Left Ventricle: Hyperdynamic LV with moderate concentric LVH and mid-cavity obliteration. Given significant LVH and aortic valve thickening consideration should be given to a possible infiltrative process (i.e. cardiac amyloid). Left ventricular ejection fraction, by estimation, is >75%. The left ventricle has hyperdynamic function. The left ventricle has no  that we know."       Current Stressors: "I don't have any"       Religious / Personal Beliefs: "Holiness"       L. Leward Quan, RN, BSN       Social Determinants of Health   Financial Resource Strain: Low Risk  (06/25/2020)   Overall Financial Resource Strain (CARDIA)    Difficulty of Paying Living Expenses: Not hard at all  Food Insecurity: No Food Insecurity (06/30/2023)   Hunger Vital Sign    Worried About Running Out of Food in the Last Year: Never true    Ran Out of Food in the Last Year: Never true  Transportation Needs: No Transportation Needs (06/30/2023)   PRAPARE - Administrator, Civil Service (Medical): No    Lack of Transportation (Non-Medical): No  Physical Activity: Sufficiently Active (06/25/2020)   Exercise Vital Sign    Days of Exercise per Week: 5 days    Minutes of Exercise per Session: 150+ min  Stress: No Stress Concern Present (10/20/2022)   [employer] of Occupational Health -  Occupational Stress Questionnaire    Feeling of Stress : Not at all  Social Connections: Moderately Integrated (06/25/2020)   Social Connection and Isolation Panel [NHANES]    Frequency of Communication with Friends and Family: More than three times a week    Frequency of Social Gatherings with Friends and Family: Twice a week    Attends Religious Services: More than 4 times per year    Active Member of [employer] or Organizations: No    Attends Banker Meetings: Never    Marital Status: Living with partner  Intimate Partner Violence: Not At Risk (06/30/2023)   Humiliation, Afraid, Rape, and Kick questionnaire    Fear of Current or Ex-Partner: No    Emotionally Abused: No    Physically Abused: No    Sexually Abused: No    BP 126/69   Pulse (!) 119   Temp 98 F (36.7 C) (Axillary)   Resp 17   Ht 5\' 9"  (1.753 m)   Wt 136 lb 14.5 oz (62.1 kg)   SpO2 97%   BMI 20.22 kg/m   He appears uncomfortable, on high oxygen requirement. He tells me that he is tired.  Lab Results  Component Value Date   WBC 8.3 07/29/2023   HGB 8.8 (L) 07/29/2023   HCT 28.2 (L) 07/29/2023   PLT 11 (LL) 07/29/2023   GLUCOSE 333 (H) 07/29/2023   CHOL 187 03/19/2022   TRIG 56 03/19/2022   HDL 52 03/19/2022   LDLCALC 124 (H) 03/19/2022   ALT 64 (H) 07/26/2023   AST 39 07/26/2023   NA 141 07/29/2023   K 3.6 07/29/2023   CL 103 07/29/2023   CREATININE 0.62 07/29/2023   BUN 26 (H) 07/29/2023   CO2 27 07/29/2023    ECHOCARDIOGRAM COMPLETE  Result Date: 07/28/2023    ECHOCARDIOGRAM LIMITED REPORT   Patient Name:   William Jones Date of Exam: 07/28/2023 Medical Rec #:  [id number]             Height:       69.0 in Accession #:    [id number]            Weight:       137.1 lb Date of Birth:  [birth year]              BSA:          1.760 m  2. Near-complete opacification of the frontal  sinuses, maxillary sinuses and right sphenoid sinus. Electronically Signed   By: Orvan Falconer M.D.   On: 06/30/2023 21:43   US Abdomen Limited RUQ (LIVER/GB)  Result Date: 06/30/2023 CLINICAL DATA:  Elevated liver enzymes EXAM: ULTRASOUND ABDOMEN LIMITED RIGHT UPPER QUADRANT COMPARISON:  None Available. FINDINGS: Gallbladder: No gallstones or wall thickening visualized. No sonographic Murphy sign noted by sonographer. Common bile duct: Diameter: 0.2 cm Liver: Scattered mostly echogenic nodules are present throughout the liver. These were not present the CT of 10/30/2018. Some have a targetoid appearance as on image 37 of series 1. Appearance suspicious for diffuse hepatic metastatic disease. Portal vein is patent on color Doppler imaging with normal direction of blood flow towards the liver. Other: None. IMPRESSION: 1. Numerous echogenic nodules throughout the liver, suspicious for diffuse hepatic metastatic disease. Consider further workup such as CT of the chest, abdomen, pelvis with contrast for further characterization and to assess for potential primary. PET-CT could also be an alternative imaging choice or adjunct. The patient has a history of prostate cancer but this would be an unusual metastatic pathway for prostate cancer, and a gastrointestinal primary or liver primary would be more common. The possibility of macronodular cirrhosis is not entirely discarded but seems less likely given the appearance. Electronically Signed   By: Gaylyn Rong M.D.   On: 06/30/2023 13:45   ECHOCARDIOGRAM COMPLETE  Result Date: 06/30/2023    ECHOCARDIOGRAM REPORT   Patient Name:   William Jones Date of Exam: 06/30/2023 Medical Rec #:  [id number]             Height:       68.0 in Accession #:    [id number]            Weight:       175.7 lb Date of Birth:  1950/11/19              BSA:          1.934 m Patient Age:    72 years              BP:           146/71 mmHg Patient Gender: M                     HR:            71 bpm. Exam Location:  Inpatient Procedure: 2D Echo, Cardiac Doppler and Color Doppler Indications:    R06.9 DOE; R60.0 Lower extremity edema; R53.83 Fatigue  History:        Patient has no prior history of Echocardiogram examinations.                 Signs/Symptoms:Dyspnea, Edema and Fatigue; Risk                 Factors:Hypertension, Diabetes, Former Smoker, History of                 substance abuse. and Dyslipidemia. Patient denies chest pain. He                 does have DOE, leg edema and extreme fatigue since 06/06/2023.  Sonographer:    Carlos American RVT, RDCS (AE), RDMS Referring Phys: [id number] EMILY B MULLEN  Sonographer Comments: Suboptimal apical window. Image acquisition challenging due to respiratory motion. IMPRESSIONS  1. Left ventricular ejection fraction, by estimation, is 60 to 65%. The left ventricle has normal  MV A velocity: 107.50 cm/s  SHUNTS MV E/A ratio:  0.47         Systemic VTI:  0.22 m                             Systemic Diam: 2.10 cm Lavona Mound Tobb DO Electronically signed by Thomasene Ripple DO  Signature Date/Time: 06/30/2023/12:36:13 PM    Final    DG Chest Port 1 View  Result Date: 06/29/2023 CLINICAL DATA:  Weakness EXAM: PORTABLE CHEST 1 VIEW COMPARISON:  None Available. FINDINGS: The heart size and mediastinal contours are within normal limits. Both lungs are clear. The visualized skeletal structures are unremarkable. IMPRESSION: No active disease. Electronically Signed   By: Darliss Cheney M.D.   On: 06/29/2023 23:28    Pathology:small cell carcinoma.  ECHOCARDIOGRAM COMPLETE  Result Date: 07/28/2023    ECHOCARDIOGRAM LIMITED REPORT   Patient Name:   William Jones Date of Exam: 07/28/2023 Medical Rec #:  [id number]             Height:       69.0 in Accession #:    [id number]            Weight:       137.1 lb Date of Birth:  22-Nov-1950              BSA:          1.760 m Patient Age:    72 years              BP:           126/71 mmHg Patient Gender: M                     HR:           109 bpm. Exam Location:  Inpatient Procedure: 2D Echo Indications:    Dyspnea R06.22  History:        Patient has prior history of Echocardiogram examinations, most                 recent 06/30/2023. Risk Factors:Hypertension and Dyslipidemia.  Sonographer:    Harriette Bouillon RDCS Referring Phys: [phone] Clarene Critchley HOFFMAN IMPRESSIONS  1. Hyperdynamic LV with moderate concentric LVH and mid-cavity obliteration. Given significant LVH and aortic valve thickening consideration should be given to a possible infiltrative process (i.e. cardiac amyloid). . Left ventricular ejection fraction,  by estimation, is >75%. The left ventricle has hyperdynamic function. The left ventricle has no regional wall motion abnormalities. There is moderate concentric left ventricular hypertrophy. Left ventricular diastolic parameters are consistent with Grade I diastolic dysfunction (impaired relaxation).  2. Right ventricular systolic function is normal. The right ventricular size is normal.  3. The mitral valve is normal in structure. No  evidence of mitral valve regurgitation. No evidence of mitral stenosis.  4. The aortic valve is tricuspid. There is mild calcification of the aortic valve. Aortic valve regurgitation is mild to moderate. No aortic stenosis is present.  5. The inferior vena cava is normal in size with greater than 50% respiratory variability, suggesting right atrial pressure of 3 mmHg. FINDINGS  Left Ventricle: Hyperdynamic LV with moderate concentric LVH and mid-cavity obliteration. Given significant LVH and aortic valve thickening consideration should be given to a possible infiltrative process (i.e. cardiac amyloid). Left ventricular ejection fraction, by estimation, is >75%. The left ventricle has hyperdynamic function. The left ventricle has no  2. Near-complete opacification of the frontal  sinuses, maxillary sinuses and right sphenoid sinus. Electronically Signed   By: Orvan Falconer M.D.   On: 06/30/2023 21:43   US Abdomen Limited RUQ (LIVER/GB)  Result Date: 06/30/2023 CLINICAL DATA:  Elevated liver enzymes EXAM: ULTRASOUND ABDOMEN LIMITED RIGHT UPPER QUADRANT COMPARISON:  None Available. FINDINGS: Gallbladder: No gallstones or wall thickening visualized. No sonographic Murphy sign noted by sonographer. Common bile duct: Diameter: 0.2 cm Liver: Scattered mostly echogenic nodules are present throughout the liver. These were not present the CT of 10/30/2018. Some have a targetoid appearance as on image 37 of series 1. Appearance suspicious for diffuse hepatic metastatic disease. Portal vein is patent on color Doppler imaging with normal direction of blood flow towards the liver. Other: None. IMPRESSION: 1. Numerous echogenic nodules throughout the liver, suspicious for diffuse hepatic metastatic disease. Consider further workup such as CT of the chest, abdomen, pelvis with contrast for further characterization and to assess for potential primary. PET-CT could also be an alternative imaging choice or adjunct. The patient has a history of prostate cancer but this would be an unusual metastatic pathway for prostate cancer, and a gastrointestinal primary or liver primary would be more common. The possibility of macronodular cirrhosis is not entirely discarded but seems less likely given the appearance. Electronically Signed   By: Gaylyn Rong M.D.   On: 06/30/2023 13:45   ECHOCARDIOGRAM COMPLETE  Result Date: 06/30/2023    ECHOCARDIOGRAM REPORT   Patient Name:   William Jones Date of Exam: 06/30/2023 Medical Rec #:  [id number]             Height:       68.0 in Accession #:    [id number]            Weight:       175.7 lb Date of Birth:  1950/11/19              BSA:          1.934 m Patient Age:    72 years              BP:           146/71 mmHg Patient Gender: M                     HR:            71 bpm. Exam Location:  Inpatient Procedure: 2D Echo, Cardiac Doppler and Color Doppler Indications:    R06.9 DOE; R60.0 Lower extremity edema; R53.83 Fatigue  History:        Patient has no prior history of Echocardiogram examinations.                 Signs/Symptoms:Dyspnea, Edema and Fatigue; Risk                 Factors:Hypertension, Diabetes, Former Smoker, History of                 substance abuse. and Dyslipidemia. Patient denies chest pain. He                 does have DOE, leg edema and extreme fatigue since 06/06/2023.  Sonographer:    Carlos American RVT, RDCS (AE), RDMS Referring Phys: [id number] EMILY B MULLEN  Sonographer Comments: Suboptimal apical window. Image acquisition challenging due to respiratory motion. IMPRESSIONS  1. Left ventricular ejection fraction, by estimation, is 60 to 65%. The left ventricle has normal

## 2023-07-29 NOTE — Progress Notes (Addendum)
CBC Lab Results  Component Value Date   WBC 8.3 07/29/2023   RBC 2.92 (L) 07/29/2023   HGB 8.8 (L) 07/29/2023   HCT 28.2 (L) 07/29/2023   MCV 96.6 07/29/2023   MCH 30.1 07/29/2023   PLT 11 (LL) 07/29/2023   MCHC 31.2 07/29/2023   RDW 16.8  (H) 07/29/2023   LYMPHSABS 0.5 (L) 07/20/2023   MONOABS 0.3 07/20/2023   EOSABS 0.0 07/20/2023   BASOSABS 0.0 07/20/2023     Last metabolic panel Lab Results  Component Value Date   NA 141 07/29/2023   K 3.6 07/29/2023   CL 103 07/29/2023   CO2 27 07/29/2023   BUN 26 (H) 07/29/2023   CREATININE 0.62 07/29/2023   GLUCOSE 333 (H) 07/29/2023   GFRNONAA >60 07/29/2023   GFRAA 103 12/18/2019   CALCIUM 6.8 (L) 07/29/2023   PHOS 3.0 07/29/2023   PROT 5.1 (L) 07/26/2023   ALBUMIN 2.1 (L) 07/26/2023   LABGLOB 2.4 04/10/2019   AGRATIO 1.6 04/10/2019   BILITOT 0.9 07/26/2023   ALKPHOS 263 (H) 07/26/2023   AST 39 07/26/2023   ALT 64 (H) 07/26/2023   ANIONGAP 11 07/29/2023    CBG (last 3)  Recent Labs    07/29/23 0336 07/29/23 0748 07/29/23 0827  GLUCAP 306* 232* 241*      Coagulation Profile: No results for input(s): "INR", "PROTIME" in the last 168 hours.   Radiology Studies: I have personally reviewed the imaging studies  ECHOCARDIOGRAM COMPLETE  Result Date: 07/28/2023    ECHOCARDIOGRAM LIMITED REPORT   Patient Name:   William Jones Date of Exam: 07/28/2023 Medical Rec #:  [id number]             Height:       69.0 in Accession #:    [id number]            Weight:       137.1 lb Date of Birth:  [birth year]              BSA:          1.760 m Patient Age:    72 years              BP:           126/71 mmHg Patient Gender: M                     HR:           109 bpm. Exam Location:  Inpatient Procedure: 2D Echo Indications:    Dyspnea R06.22  History:        Patient has prior history of Echocardiogram examinations, most                 recent 06/30/2023. Risk Factors:Hypertension and Dyslipidemia.  Sonographer:    Harriette Bouillon RDCS Referring Phys: [phone] Clarene Critchley HOFFMAN IMPRESSIONS  1. Hyperdynamic LV with moderate concentric LVH and mid-cavity obliteration. Given significant LVH and aortic valve thickening consideration should be given to a possible infiltrative process (i.e.  cardiac amyloid). . Left ventricular ejection fraction,  by estimation, is >75%. The left ventricle has hyperdynamic function. The left ventricle has no regional wall motion abnormalities. There is moderate concentric left ventricular hypertrophy. Left ventricular diastolic parameters are consistent with Grade I diastolic dysfunction (impaired relaxation).  2. Right ventricular systolic function is normal. The right ventricular size is normal.  3. The mitral valve is normal in structure. No evidence of mitral valve regurgitation.  CBC Lab Results  Component Value Date   WBC 8.3 07/29/2023   RBC 2.92 (L) 07/29/2023   HGB 8.8 (L) 07/29/2023   HCT 28.2 (L) 07/29/2023   MCV 96.6 07/29/2023   MCH 30.1 07/29/2023   PLT 11 (LL) 07/29/2023   MCHC 31.2 07/29/2023   RDW 16.8  (H) 07/29/2023   LYMPHSABS 0.5 (L) 07/20/2023   MONOABS 0.3 07/20/2023   EOSABS 0.0 07/20/2023   BASOSABS 0.0 07/20/2023     Last metabolic panel Lab Results  Component Value Date   NA 141 07/29/2023   K 3.6 07/29/2023   CL 103 07/29/2023   CO2 27 07/29/2023   BUN 26 (H) 07/29/2023   CREATININE 0.62 07/29/2023   GLUCOSE 333 (H) 07/29/2023   GFRNONAA >60 07/29/2023   GFRAA 103 12/18/2019   CALCIUM 6.8 (L) 07/29/2023   PHOS 3.0 07/29/2023   PROT 5.1 (L) 07/26/2023   ALBUMIN 2.1 (L) 07/26/2023   LABGLOB 2.4 04/10/2019   AGRATIO 1.6 04/10/2019   BILITOT 0.9 07/26/2023   ALKPHOS 263 (H) 07/26/2023   AST 39 07/26/2023   ALT 64 (H) 07/26/2023   ANIONGAP 11 07/29/2023    CBG (last 3)  Recent Labs    07/29/23 0336 07/29/23 0748 07/29/23 0827  GLUCAP 306* 232* 241*      Coagulation Profile: No results for input(s): "INR", "PROTIME" in the last 168 hours.   Radiology Studies: I have personally reviewed the imaging studies  ECHOCARDIOGRAM COMPLETE  Result Date: 07/28/2023    ECHOCARDIOGRAM LIMITED REPORT   Patient Name:   William Jones Date of Exam: 07/28/2023 Medical Rec #:  [id number]             Height:       69.0 in Accession #:    [id number]            Weight:       137.1 lb Date of Birth:  [birth year]              BSA:          1.760 m Patient Age:    72 years              BP:           126/71 mmHg Patient Gender: M                     HR:           109 bpm. Exam Location:  Inpatient Procedure: 2D Echo Indications:    Dyspnea R06.22  History:        Patient has prior history of Echocardiogram examinations, most                 recent 06/30/2023. Risk Factors:Hypertension and Dyslipidemia.  Sonographer:    Harriette Bouillon RDCS Referring Phys: [phone] Clarene Critchley HOFFMAN IMPRESSIONS  1. Hyperdynamic LV with moderate concentric LVH and mid-cavity obliteration. Given significant LVH and aortic valve thickening consideration should be given to a possible infiltrative process (i.e.  cardiac amyloid). . Left ventricular ejection fraction,  by estimation, is >75%. The left ventricle has hyperdynamic function. The left ventricle has no regional wall motion abnormalities. There is moderate concentric left ventricular hypertrophy. Left ventricular diastolic parameters are consistent with Grade I diastolic dysfunction (impaired relaxation).  2. Right ventricular systolic function is normal. The right ventricular size is normal.  3. The mitral valve is normal in structure. No evidence of mitral valve regurgitation.  CBC Lab Results  Component Value Date   WBC 8.3 07/29/2023   RBC 2.92 (L) 07/29/2023   HGB 8.8 (L) 07/29/2023   HCT 28.2 (L) 07/29/2023   MCV 96.6 07/29/2023   MCH 30.1 07/29/2023   PLT 11 (LL) 07/29/2023   MCHC 31.2 07/29/2023   RDW 16.8  (H) 07/29/2023   LYMPHSABS 0.5 (L) 07/20/2023   MONOABS 0.3 07/20/2023   EOSABS 0.0 07/20/2023   BASOSABS 0.0 07/20/2023     Last metabolic panel Lab Results  Component Value Date   NA 141 07/29/2023   K 3.6 07/29/2023   CL 103 07/29/2023   CO2 27 07/29/2023   BUN 26 (H) 07/29/2023   CREATININE 0.62 07/29/2023   GLUCOSE 333 (H) 07/29/2023   GFRNONAA >60 07/29/2023   GFRAA 103 12/18/2019   CALCIUM 6.8 (L) 07/29/2023   PHOS 3.0 07/29/2023   PROT 5.1 (L) 07/26/2023   ALBUMIN 2.1 (L) 07/26/2023   LABGLOB 2.4 04/10/2019   AGRATIO 1.6 04/10/2019   BILITOT 0.9 07/26/2023   ALKPHOS 263 (H) 07/26/2023   AST 39 07/26/2023   ALT 64 (H) 07/26/2023   ANIONGAP 11 07/29/2023    CBG (last 3)  Recent Labs    07/29/23 0336 07/29/23 0748 07/29/23 0827  GLUCAP 306* 232* 241*      Coagulation Profile: No results for input(s): "INR", "PROTIME" in the last 168 hours.   Radiology Studies: I have personally reviewed the imaging studies  ECHOCARDIOGRAM COMPLETE  Result Date: 07/28/2023    ECHOCARDIOGRAM LIMITED REPORT   Patient Name:   William Jones Date of Exam: 07/28/2023 Medical Rec #:  [id number]             Height:       69.0 in Accession #:    [id number]            Weight:       137.1 lb Date of Birth:  [birth year]              BSA:          1.760 m Patient Age:    72 years              BP:           126/71 mmHg Patient Gender: M                     HR:           109 bpm. Exam Location:  Inpatient Procedure: 2D Echo Indications:    Dyspnea R06.22  History:        Patient has prior history of Echocardiogram examinations, most                 recent 06/30/2023. Risk Factors:Hypertension and Dyslipidemia.  Sonographer:    Harriette Bouillon RDCS Referring Phys: [phone] Clarene Critchley HOFFMAN IMPRESSIONS  1. Hyperdynamic LV with moderate concentric LVH and mid-cavity obliteration. Given significant LVH and aortic valve thickening consideration should be given to a possible infiltrative process (i.e.  cardiac amyloid). . Left ventricular ejection fraction,  by estimation, is >75%. The left ventricle has hyperdynamic function. The left ventricle has no regional wall motion abnormalities. There is moderate concentric left ventricular hypertrophy. Left ventricular diastolic parameters are consistent with Grade I diastolic dysfunction (impaired relaxation).  2. Right ventricular systolic function is normal. The right ventricular size is normal.  3. The mitral valve is normal in structure. No evidence of mitral valve regurgitation.  [facility]                                                                              [place], is a 72 y.o. male, DOB - [birth year], [id number] Admit date - 07/10/2023    Outpatient Primary MD for the patient is Morene Crocker, MD  LOS - 17  days  Chief Complaint  Patient presents with   Weakness       Brief summary   72 year old M with HTN, HLD, and prostate cancer, recently admitted for generalized weakness, found to have new metastatic neuroendocrine tumor to liver and adrenals.   Was discharged home with plans for outpatient chemo start, but became profoundly weak, returned to the hospital   In the ER, mild transaminitis due to tumor, mild hypokalemia, WBC normal, no fever.  UA with pyuria. 10/6: Started on CTX, admitted for weakness, Xtandi held 10/7:  Oncology consulted, planned for inpatient chemo 10/20: Acute hypoxic respiratory failure with increasing O2 requirements, transfer to stepdown, placed on Lasix, albumin, BiPAP 10/23: Escalating O2 needs, on 15 L O2 HFNC and BiPAP, PCCM consulted 1025: On BiPAP overnight and NRB daily, platelets 11K.  Oncology, palliative medicine following  Assessment & Plan    Principal Problem: Septic shock, lactic acidosis, UTI -Urine culture showed Klebsiella oxytoca.  Blood culture has been negative.   -CT scan abdomen pelvis 10/8 that showed multifocal pneumonia, innumerable liver masses consistent with metastatic disease, multiple adrenal masses and ? Pancreatitis  Active Problems:   Acute respiratory failure with hypoxia (HCC) -At baseline, not on any O2 at home -On 10/20, patient was transferred to SDU due to markedly worsened respiratory status and increased O2 requirements. Chest x-ray on 10/20 showed pulmonary edema, atypical infection -CTA chest showed no PE, diffuse airspace opacities noted throughout both lungs consistent with pneumonia or possibly edema.  Hepatic and adrenal  metastatic lesions noted, T10 metastatic lesion. -Continue Lasix 60 mg IV twice daily, negative balance of 2.8 L -Started on steroids, broad-spectrum antibiotics by CCM -No significant improvement, on nonrebreather this a.m., overnight on BiPAP -PCCM following.  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 Al Pimple 10/24, did not recommend platelet transfusion.  However platelets 11K today, Dr. Fanny Skates will follow up today and will await recommendations.  Currently no bleeding. Addendum: 1:58pm Reviewed Dr Remonia Richter recommendations, will transfuse 1 platelet pheresis, however it is just a temporary measure while goals of care are being clarified and she discussed with the patient and daughter.  She also recommended changing the antibiotics as he was placed on broad-spectrum antibiotics 2 days ago.  Overall poor prognosis.  Anemia: -Patient underwent EGD on 07/15/2023 -Colonoscopy 10/18 with findings of external hemorrhoids. Per GI, if bleeding occurs, ice  [facility]                                                                              [place], is a 72 y.o. male, DOB - [birth year], [id number] Admit date - 07/10/2023    Outpatient Primary MD for the patient is Morene Crocker, MD  LOS - 17  days  Chief Complaint  Patient presents with   Weakness       Brief summary   72 year old M with HTN, HLD, and prostate cancer, recently admitted for generalized weakness, found to have new metastatic neuroendocrine tumor to liver and adrenals.   Was discharged home with plans for outpatient chemo start, but became profoundly weak, returned to the hospital   In the ER, mild transaminitis due to tumor, mild hypokalemia, WBC normal, no fever.  UA with pyuria. 10/6: Started on CTX, admitted for weakness, Xtandi held 10/7:  Oncology consulted, planned for inpatient chemo 10/20: Acute hypoxic respiratory failure with increasing O2 requirements, transfer to stepdown, placed on Lasix, albumin, BiPAP 10/23: Escalating O2 needs, on 15 L O2 HFNC and BiPAP, PCCM consulted 1025: On BiPAP overnight and NRB daily, platelets 11K.  Oncology, palliative medicine following  Assessment & Plan    Principal Problem: Septic shock, lactic acidosis, UTI -Urine culture showed Klebsiella oxytoca.  Blood culture has been negative.   -CT scan abdomen pelvis 10/8 that showed multifocal pneumonia, innumerable liver masses consistent with metastatic disease, multiple adrenal masses and ? Pancreatitis  Active Problems:   Acute respiratory failure with hypoxia (HCC) -At baseline, not on any O2 at home -On 10/20, patient was transferred to SDU due to markedly worsened respiratory status and increased O2 requirements. Chest x-ray on 10/20 showed pulmonary edema, atypical infection -CTA chest showed no PE, diffuse airspace opacities noted throughout both lungs consistent with pneumonia or possibly edema.  Hepatic and adrenal  metastatic lesions noted, T10 metastatic lesion. -Continue Lasix 60 mg IV twice daily, negative balance of 2.8 L -Started on steroids, broad-spectrum antibiotics by CCM -No significant improvement, on nonrebreather this a.m., overnight on BiPAP -PCCM following.  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 Al Pimple 10/24, did not recommend platelet transfusion.  However platelets 11K today, Dr. Fanny Skates will follow up today and will await recommendations.  Currently no bleeding. Addendum: 1:58pm Reviewed Dr Remonia Richter recommendations, will transfuse 1 platelet pheresis, however it is just a temporary measure while goals of care are being clarified and she discussed with the patient and daughter.  She also recommended changing the antibiotics as he was placed on broad-spectrum antibiotics 2 days ago.  Overall poor prognosis.  Anemia: -Patient underwent EGD on 07/15/2023 -Colonoscopy 10/18 with findings of external hemorrhoids. Per GI, if bleeding occurs, ice  [facility]                                                                              [place], is a 72 y.o. male, DOB - [birth year], [id number] Admit date - 07/10/2023    Outpatient Primary MD for the patient is Morene Crocker, MD  LOS - 17  days  Chief Complaint  Patient presents with   Weakness       Brief summary   72 year old M with HTN, HLD, and prostate cancer, recently admitted for generalized weakness, found to have new metastatic neuroendocrine tumor to liver and adrenals.   Was discharged home with plans for outpatient chemo start, but became profoundly weak, returned to the hospital   In the ER, mild transaminitis due to tumor, mild hypokalemia, WBC normal, no fever.  UA with pyuria. 10/6: Started on CTX, admitted for weakness, Xtandi held 10/7:  Oncology consulted, planned for inpatient chemo 10/20: Acute hypoxic respiratory failure with increasing O2 requirements, transfer to stepdown, placed on Lasix, albumin, BiPAP 10/23: Escalating O2 needs, on 15 L O2 HFNC and BiPAP, PCCM consulted 1025: On BiPAP overnight and NRB daily, platelets 11K.  Oncology, palliative medicine following  Assessment & Plan    Principal Problem: Septic shock, lactic acidosis, UTI -Urine culture showed Klebsiella oxytoca.  Blood culture has been negative.   -CT scan abdomen pelvis 10/8 that showed multifocal pneumonia, innumerable liver masses consistent with metastatic disease, multiple adrenal masses and ? Pancreatitis  Active Problems:   Acute respiratory failure with hypoxia (HCC) -At baseline, not on any O2 at home -On 10/20, patient was transferred to SDU due to markedly worsened respiratory status and increased O2 requirements. Chest x-ray on 10/20 showed pulmonary edema, atypical infection -CTA chest showed no PE, diffuse airspace opacities noted throughout both lungs consistent with pneumonia or possibly edema.  Hepatic and adrenal  metastatic lesions noted, T10 metastatic lesion. -Continue Lasix 60 mg IV twice daily, negative balance of 2.8 L -Started on steroids, broad-spectrum antibiotics by CCM -No significant improvement, on nonrebreather this a.m., overnight on BiPAP -PCCM following.  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 Al Pimple 10/24, did not recommend platelet transfusion.  However platelets 11K today, Dr. Fanny Skates will follow up today and will await recommendations.  Currently no bleeding. Addendum: 1:58pm Reviewed Dr Remonia Richter recommendations, will transfuse 1 platelet pheresis, however it is just a temporary measure while goals of care are being clarified and she discussed with the patient and daughter.  She also recommended changing the antibiotics as he was placed on broad-spectrum antibiotics 2 days ago.  Overall poor prognosis.  Anemia: -Patient underwent EGD on 07/15/2023 -Colonoscopy 10/18 with findings of external hemorrhoids. Per GI, if bleeding occurs, ice

## 2023-07-29 NOTE — Progress Notes (Signed)
PT Cancellation Note  Patient Details Name: William Jones MRN: [id number] DOB: 1951-02-23   Cancelled Treatment:    Reason Eval/Treat Not Completed: PT screened, PT recently  signed off due to poor respiratory status. Patient currently on BiPAP, when off requires NRB and HFNC per RN. RN reports SOB  limiting any activity and eating. PT will sign off. Blanchard Kelch PT Acute Rehabilitation Services Office [phone] Weekend pager-[phone]    [name] 07/29/2023, 9:35 AM

## 2023-07-29 NOTE — Progress Notes (Addendum)
Pharmacy Electrolyte Replacement  Recent Labs:  Recent Labs    07/29/23 0303  K 3.6  MG 2.0  PHOS 3.0  CREATININE 0.62   Patient diuresing extensively on BID IV Lasix since 10/20 for fluid overload and increased WOB. with great difficulty keeping up with electrolytes. Asked by Dr. Rhona Leavens 10/22 for pharmacy to help with monitoring and electrolyte supplementation. - We are also contributing to fluid overload with all the electrolyte supplementation bags of fluid.  UOP 10/20: 4225 UOP 10/21: 2550 UOP:10/22: 3525 ml UOP 10/23: 2850 ml UOP 10/24:  - K 3.6: Currently on Kdur BID (also receiving 59meq/d from Kphos suppl) - Mg 2 WNL (on Magox 400mg  BID) - Phos 3 WNL (on KPhos Neutral 500mg  QID)  - CoCa 8.32 (on Oscal 500mg  BID)  Plan: Regular diet so will try to utilize some oral supplementation  - HOLD all oral supplements as pt cannot take this AM (Oscal, Magos, KPhos neutroal, Kdur) due to respiratory status. - Ca Gluconate 2g IV x 1  - Mag Sulfate 2g IV x 1 - KPhos IV IV x 1 - K IV x 5 runs - BMET, Mg, Phos daily  - When IV Lasix stops or UOP declines, we will have to be vigilant to decrease/stop all these supplements.  Heike Pounds S. Merilynn Finland, PharmD, BCPS Clinical Staff Pharmacist [email or web address] 07/29/2023 7:55 AM

## 2023-07-29 NOTE — Plan of Care (Signed)
  Problem: Nutritional: Goal: Maintenance of adequate nutrition will improve Outcome: Progressing   Problem: Education: Goal: Knowledge of General Education information will improve Description: Including pain rating scale, medication(s)/side effects and non-pharmacologic comfort measures Outcome: Progressing   Problem: Clinical Measurements: Goal: Will remain free from infection Outcome: Progressing   Problem: Nutrition: Goal: Adequate nutrition will be maintained Outcome: Progressing

## 2023-07-30 DIAGNOSIS — C801 Malignant (primary) neoplasm, unspecified: Secondary | ICD-10-CM | POA: Diagnosis not present

## 2023-07-30 DIAGNOSIS — J810 Acute pulmonary edema: Secondary | ICD-10-CM | POA: Diagnosis not present

## 2023-07-30 DIAGNOSIS — R579 Shock, unspecified: Secondary | ICD-10-CM | POA: Diagnosis not present

## 2023-07-30 DIAGNOSIS — N3000 Acute cystitis without hematuria: Secondary | ICD-10-CM | POA: Diagnosis not present

## 2023-07-30 DIAGNOSIS — R531 Weakness: Secondary | ICD-10-CM | POA: Diagnosis not present

## 2023-07-30 DIAGNOSIS — R5383 Other fatigue: Secondary | ICD-10-CM | POA: Diagnosis not present

## 2023-07-30 DIAGNOSIS — Z515 Encounter for palliative care: Secondary | ICD-10-CM | POA: Diagnosis not present

## 2023-07-30 LAB — BASIC METABOLIC PANEL
Anion gap: 8 (ref 5–15)
BUN: 25 mg/dL — ABNORMAL HIGH (ref 8–23)
CO2: 30 mmol/L (ref 22–32)
Calcium: 6.9 mg/dL — ABNORMAL LOW (ref 8.9–10.3)
Chloride: 103 mmol/L (ref 98–111)
Creatinine, Ser: 0.52 mg/dL — ABNORMAL LOW (ref 0.61–1.24)
GFR, Estimated: >60 mL/min (ref >60)
Glucose, Bld: 100 mg/dL — ABNORMAL HIGH (ref 70–99)
Potassium: 3.6 mmol/L (ref 3.5–5.1)
Sodium: 141 mmol/L (ref 135–145)

## 2023-07-30 LAB — CBC
HCT: 25.7 % — ABNORMAL LOW (ref 39.0–52.0)
Hemoglobin: 8.4 g/dL — ABNORMAL LOW (ref 13.0–17.0)
MCH: 30.4 pg (ref 26.0–34.0)
MCHC: 32.7 g/dL (ref 30.0–36.0)
MCV: 93.1 fL (ref 80.0–100.0)
Platelets: 8 10*3/uL — CL (ref 150–400)
RBC: 2.76 MIL/uL — ABNORMAL LOW (ref 4.22–5.81)
RDW: 16.8 % — ABNORMAL HIGH (ref 11.5–15.5)
WBC: 7.7 10*3/uL (ref 4.0–10.5)
nRBC: 1.3 % — ABNORMAL HIGH (ref 0.0–0.2)

## 2023-07-30 LAB — CBC WITH DIFFERENTIAL/PLATELET
Abs Immature Granulocytes: 0.06 10*3/uL (ref 0.00–0.07)
Basophils Absolute: 0 10*3/uL (ref 0.0–0.1)
Basophils Relative: 0 %
Eosinophils Absolute: 0 10*3/uL (ref 0.0–0.5)
Eosinophils Relative: 0 %
HCT: 27 % — ABNORMAL LOW (ref 39.0–52.0)
Hemoglobin: 8.7 g/dL — ABNORMAL LOW (ref 13.0–17.0)
Immature Granulocytes: 1 %
Lymphocytes Relative: 5 %
Lymphs Abs: 0.4 10*3/uL — ABNORMAL LOW (ref 0.7–4.0)
MCH: 30.2 pg (ref 26.0–34.0)
MCHC: 32.2 g/dL (ref 30.0–36.0)
MCV: 93.8 fL (ref 80.0–100.0)
Monocytes Absolute: 0.2 10*3/uL (ref 0.1–1.0)
Monocytes Relative: 3 %
Neutro Abs: 7.8 10*3/uL — ABNORMAL HIGH (ref 1.7–7.7)
Neutrophils Relative %: 91 %
Platelets: 17 10*3/uL — CL (ref 150–400)
RBC: 2.88 MIL/uL — ABNORMAL LOW (ref 4.22–5.81)
RDW: 16.8 % — ABNORMAL HIGH (ref 11.5–15.5)
WBC: 8.4 10*3/uL (ref 4.0–10.5)
nRBC: 1.9 % — ABNORMAL HIGH (ref 0.0–0.2)

## 2023-07-30 LAB — GLUCOSE, CAPILLARY
Glucose-Capillary: 100 mg/dL — ABNORMAL HIGH (ref 70–99)
Glucose-Capillary: 105 mg/dL — ABNORMAL HIGH (ref 70–99)
Glucose-Capillary: 174 mg/dL — ABNORMAL HIGH (ref 70–99)
Glucose-Capillary: 188 mg/dL — ABNORMAL HIGH (ref 70–99)
Glucose-Capillary: 57 mg/dL — ABNORMAL LOW (ref 70–99)
Glucose-Capillary: 69 mg/dL — ABNORMAL LOW (ref 70–99)
Glucose-Capillary: 97 mg/dL (ref 70–99)
Glucose-Capillary: 97 mg/dL (ref 70–99)

## 2023-07-30 LAB — PHOSPHORUS: Phosphorus: 2.8 mg/dL (ref 2.5–4.6)

## 2023-07-30 LAB — MAGNESIUM: Magnesium: 2.2 mg/dL (ref 1.7–2.4)

## 2023-07-30 MED ORDER — CALCIUM GLUCONATE-NACL 2-0.675 GM/100ML-% IV SOLN
2.0000 g | Freq: Once | INTRAVENOUS | Status: AC
  Administered 2023-07-30: 2000 mg via INTRAVENOUS
  Filled 2023-07-30: qty 100

## 2023-07-30 MED ORDER — POTASSIUM CHLORIDE 10 MEQ/100ML IV SOLN
10.0000 meq | INTRAVENOUS | Status: AC
  Administered 2023-07-30 (×5): 10 meq via INTRAVENOUS
  Filled 2023-07-30 (×5): qty 100

## 2023-07-30 MED ORDER — SODIUM CHLORIDE 0.9% IV SOLUTION: Freq: Once | INTRAVENOUS | Status: AC

## 2023-07-30 MED ORDER — DEXTROSE 50 % IV SOLN: 12.5000 g | INTRAVENOUS | Status: AC

## 2023-07-30 MED ORDER — ORAL CARE MOUTH RINSE: 15.0000 mL | OROMUCOSAL | Status: DC | PRN

## 2023-07-30 MED ORDER — ORAL CARE MOUTH RINSE
15.0000 mL | OROMUCOSAL | Status: DC
  Administered 2023-07-30 – 2023-08-04 (×13): 15 mL via OROMUCOSAL

## 2023-07-30 MED ORDER — DEXTROSE 50 % IV SOLN
INTRAVENOUS | Status: AC
  Filled 2023-07-30: qty 50

## 2023-07-30 MED ORDER — MAGNESIUM SULFATE IN D5W 1-5 GM/100ML-% IV SOLN
1.0000 g | Freq: Once | INTRAVENOUS | Status: AC
  Administered 2023-07-30: 1 g via INTRAVENOUS
  Filled 2023-07-30: qty 100

## 2023-07-30 NOTE — Progress Notes (Signed)
Daily Progress Note   Patient Name: William Jones       Date: 07/30/2023 DOB: Sep 28, 1951  Age: 72 y.o. MRN#: [id number] Attending Physician: Cathren Harsh, MD Primary Care Physician: Morene Crocker, MD Admit Date: 08-02-2023 Length of Stay: 18 days  Reason for Consultation/Follow-up: Establishing goals of care  Subjective:   CC: Patient having worsening shortness of breath with mouth care. Following up regarding complex medical decision making.   Subjective:  Reviewed EMR prior to presenting to bedside.   Bedside RN present when coming to see patient.  RN noted patient had desaturated greatly with removal of nonrebreather for mouth care.  Patient's daughter and girlfriend present at bedside as well.  Patient still wanting attempts to eat.  Discussed placing order for dysphagia diet to hopefully allow patient softer foods so not as much work of breathing to eat.  Again discussed patient has as needed opioids to assist with dyspnea management which actually may allow him to eat and communicate more with family.  All questions answered at that time.  Noted palliative medicine team continued following with patient's medical journey.  Objective:   Vital Signs:  BP 124/63 (BP Location: Right Arm)   Pulse 100   Temp 98 F (36.7 C) (Axillary)   Resp (!) 21   Ht 5\' 9"  (1.753 m)   Wt 62.1 kg   SpO2 99%   BMI 20.22 kg/m   Physical Exam: General: increased work of breathing noted, alert, laying in bed, chronically ill appearing, frail, cachectic HENT: dry mucous membranes Cardiovascular: RRR Respiratory: increased work of breathing noted on HFNC and non re breather  Skin: no rashes or lesions on visible skin Neuro: A&Ox4 Psych: appropriately answers all questions  Imaging: I personally reviewed recent imaging.   Assessment & Plan:   Assessment: Patient is a 72yo male with a PMHx of HTN, HLD, prostate adenocarcinoma, and recent diagnosis of small cell carcinoma  either with primary liver diagnosis or is secondary who was admitted on August 02, 2023 for management of worsening weakness. During hospitalization patient has received management for septic shock secondary to UTI, acute respiratory failure, and electrolyte imbalances. Oncology and PCCM consulted. Palliative medicine team consulted to assist with complex medical decision making.   Recommendations/Plan: # Complex medical decision making/goals of care:     -Patient and daughter have heard that patient is not a candidate for further cancer directed therapies. They have also heard that despite aggressive medical interventions, patient's respiratory status continues to worsen and he is likely at end of life. Recommendation has been made multiple times to transition to comfort focused care/hospice support though patient and family not ready to make this transition. As per IPAL note fro PCCM on 10/25: No intubation, full DNR, no further BiPAP therapy if hypoxia develops will instead treat with high flow nasal cannula and nonrebreather; continue IV antibiotics as recommended per clinical team; platelet transfusion as needed; regular diet with knowledge that aspiration or worsening hypoxia may develop; continued attempts to ambulate or move for comfort.                -Patient already gave permission to that his daughter should be called first with updates and then his friend/girlfriend, William Jones.                 -  Code Status: Limited: Do not attempt resuscitation (DNR) -DNR-LIMITED -Do Not Intubate/DNI     # Symptom management                -  As per PCCM   # Psycho-social/Spiritual Support:  - Support System: daughter (primary contact/NOK), friend/girlfriend, reported multiple family members visiting from [place] later today  -Already reached out to chaplains about spiritual support  # Discharge Planning: To Be Determined  Discussed with: patient, patient's daughter, oncologist, hospitalist, RN, chaplain  Thank you  for allowing the palliative care team to participate in the care [facility].  Alvester Morin, DO Palliative Care Provider PMT # [phone]  If patient remains symptomatic despite maximum doses, please call PMT at [phone] between 0700 and 1900. Outside of these hours, please call attending, as PMT does not have night coverage.  *Please note that this is a verbal dictation therefore any spelling or grammatical errors are due to the "[employer]" system interpretation.

## 2023-07-30 NOTE — Plan of Care (Signed)
  Problem: Education: Goal: Knowledge of General Education information will improve Description: Including pain rating scale, medication(s)/side effects and non-pharmacologic comfort measures Outcome: Progressing   Problem: Clinical Measurements: Goal: Respiratory complications will improve Outcome: Progressing   Problem: Coping: Goal: Level of anxiety will decrease Outcome: Progressing   

## 2023-07-30 NOTE — Progress Notes (Addendum)
[facility]                                                                              Kersey, is a 72 y.o. male, DOB - [birth year], [id number] Admit date - 07/10/2023    Outpatient Primary MD for the patient is Morene Crocker, MD  LOS - 18  days  Chief Complaint  Patient presents with   Weakness       Brief summary   72 year old M with HTN, HLD, and prostate cancer, recently admitted for generalized weakness, found to have new metastatic neuroendocrine tumor to liver and adrenals.   Was discharged home with plans for outpatient chemo start, but became profoundly weak, returned to the hospital   In the ER, mild transaminitis due to tumor, mild hypokalemia, WBC normal, no fever.  UA with pyuria. 10/6: Started on CTX, admitted for weakness, Xtandi held 10/7:  Oncology consulted, planned for inpatient chemo 10/20: Acute hypoxic respiratory failure with increasing O2 requirements, transfer to stepdown, placed on Lasix, albumin, BiPAP 10/23: Escalating O2 needs, on 15 L O2 HFNC and BiPAP, PCCM consulted 10/25: On BiPAP overnight and NRB daily, platelets 11K.  Oncology, palliative medicine following  Assessment & Plan    Principal Problem: Septic shock, lactic acidosis, UTI -Urine culture showed Klebsiella oxytoca.  Blood culture has been negative.   -CT scan abdomen pelvis 10/8 that showed multifocal pneumonia, innumerable liver masses consistent with metastatic disease, multiple adrenal masses and ? Pancreatitis  Active Problems:   Acute respiratory failure with hypoxia (HCC) -At baseline, not on any O2 at home -On 10/20, patient was transferred to SDU due to markedly worsened respiratory status and increased O2 requirements. Chest x-ray on 10/20 showed pulmonary edema, atypical infection -CTA chest showed no PE, diffuse airspace opacities noted throughout both lungs consistent with pneumonia or possibly edema.  Hepatic and adrenal  metastatic lesions noted, T10 metastatic lesion. 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  HIT ab's 0.086, SRA pending -Platelets down to 8k today, was transfused 1 platelet pheresis on 10/25.  Per oncology, just a temporary measure, high risk of bleeding, overall poor prognosis -Will await oncology recommendation to transfuse PLTs, Nplate? D/w on call Dr Mosetta Putt, recommended to transfuse 1 unit platelets  Anemia: -Patient underwent EGD on 07/15/2023 -Colonoscopy 10/18 with findings of external hemorrhoids. Per GI, if bleeding occurs, ice pack can help and if bleeding persists, surgical eval may be required -H/H stable    Essential hypertension -BP stable, continue metoprolol 5 mg IV every 6 hours  Acute Transaminitis -Likely due to  [facility]                                                                              Kersey, is a 72 y.o. male, DOB - [birth year], [id number] Admit date - 07/10/2023    Outpatient Primary MD for the patient is Morene Crocker, MD  LOS - 18  days  Chief Complaint  Patient presents with   Weakness       Brief summary   72 year old M with HTN, HLD, and prostate cancer, recently admitted for generalized weakness, found to have new metastatic neuroendocrine tumor to liver and adrenals.   Was discharged home with plans for outpatient chemo start, but became profoundly weak, returned to the hospital   In the ER, mild transaminitis due to tumor, mild hypokalemia, WBC normal, no fever.  UA with pyuria. 10/6: Started on CTX, admitted for weakness, Xtandi held 10/7:  Oncology consulted, planned for inpatient chemo 10/20: Acute hypoxic respiratory failure with increasing O2 requirements, transfer to stepdown, placed on Lasix, albumin, BiPAP 10/23: Escalating O2 needs, on 15 L O2 HFNC and BiPAP, PCCM consulted 10/25: On BiPAP overnight and NRB daily, platelets 11K.  Oncology, palliative medicine following  Assessment & Plan    Principal Problem: Septic shock, lactic acidosis, UTI -Urine culture showed Klebsiella oxytoca.  Blood culture has been negative.   -CT scan abdomen pelvis 10/8 that showed multifocal pneumonia, innumerable liver masses consistent with metastatic disease, multiple adrenal masses and ? Pancreatitis  Active Problems:   Acute respiratory failure with hypoxia (HCC) -At baseline, not on any O2 at home -On 10/20, patient was transferred to SDU due to markedly worsened respiratory status and increased O2 requirements. Chest x-ray on 10/20 showed pulmonary edema, atypical infection -CTA chest showed no PE, diffuse airspace opacities noted throughout both lungs consistent with pneumonia or possibly edema.  Hepatic and adrenal  metastatic lesions noted, T10 metastatic lesion. 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  HIT ab's 0.086, SRA pending -Platelets down to 8k today, was transfused 1 platelet pheresis on 10/25.  Per oncology, just a temporary measure, high risk of bleeding, overall poor prognosis -Will await oncology recommendation to transfuse PLTs, Nplate? D/w on call Dr Mosetta Putt, recommended to transfuse 1 unit platelets  Anemia: -Patient underwent EGD on 07/15/2023 -Colonoscopy 10/18 with findings of external hemorrhoids. Per GI, if bleeding occurs, ice pack can help and if bleeding persists, surgical eval may be required -H/H stable    Essential hypertension -BP stable, continue metoprolol 5 mg IV every 6 hours  Acute Transaminitis -Likely due to  at the bases Gastrointestinal: Soft, nontender, nondistended, NBS Ext: no pedal edema bilaterally Neuro: no new deficits Psych: Normal affect   Data Reviewed:  I have personally reviewed following labs    CBC Lab Results  Component Value Date   WBC 7.7 07/30/2023   RBC 2.76 (L) 07/30/2023   HGB 8.4 (L) 07/30/2023   HCT 25.7 (L) 07/30/2023   MCV 93.1 07/30/2023   MCH 30.4 07/30/2023   PLT 8  (LL) 07/30/2023   MCHC 32.7 07/30/2023   RDW 16.8 (H) 07/30/2023   LYMPHSABS 0.5 (L) 07/20/2023   MONOABS 0.3 07/20/2023   EOSABS 0.0 07/20/2023   BASOSABS 0.0 07/20/2023     Last metabolic panel Lab Results  Component Value Date   NA 141 07/30/2023   K 3.6 07/30/2023   CL 103 07/30/2023   CO2 30 07/30/2023   BUN 25 (H) 07/30/2023   CREATININE 0.52 (L) 07/30/2023   GLUCOSE 100 (H) 07/30/2023   GFRNONAA >60 07/30/2023   GFRAA 103 12/18/2019   CALCIUM 6.9 (L) 07/30/2023   PHOS 2.8 07/30/2023   PROT 5.1 (L) 07/26/2023   ALBUMIN 2.1 (L) 07/26/2023   LABGLOB 2.4 04/10/2019   AGRATIO 1.6 04/10/2019   BILITOT 0.9 07/26/2023   ALKPHOS 263 (H) 07/26/2023   AST 39 07/26/2023   ALT 64 (H) 07/26/2023   ANIONGAP 8 07/30/2023    CBG (last 3)  Recent Labs    07/29/23 2138 07/30/23 0232 07/30/23 0838  GLUCAP 132* 105* 100*      Coagulation Profile: No results for input(s): "INR", "PROTIME" in the last 168 hours.   Radiology Studies: I have personally reviewed the imaging studies  ECHOCARDIOGRAM COMPLETE  Result Date: 07/28/2023    ECHOCARDIOGRAM LIMITED REPORT   Patient Name:   William Jones Date of Exam: 07/28/2023 Medical Rec #:  [id number]             Height:       69.0 in Accession #:    [id number]            Weight:       137.1 lb Date of Birth:  21-Feb-1951              BSA:          1.760 m Patient Age:    72 years              BP:           126/71 mmHg Patient Gender: M                     HR:           109 bpm. Exam Location:  Inpatient Procedure: 2D Echo Indications:    Dyspnea R06.22  History:        Patient has prior history of Echocardiogram examinations, most                 recent 06/30/2023. Risk Factors:Hypertension and Dyslipidemia.  Sonographer:    Harriette Bouillon RDCS Referring Phys: [phone] Clarene Critchley HOFFMAN IMPRESSIONS  1. Hyperdynamic LV with moderate concentric LVH and mid-cavity obliteration. Given significant LVH and aortic valve thickening consideration  should be given to a possible infiltrative process (i.e. cardiac amyloid). . Left ventricular ejection fraction,  by estimation, is >75%. The left ventricle has hyperdynamic function. The left ventricle has no regional wall motion abnormalities. There is moderate concentric left ventricular hypertrophy. Left ventricular diastolic parameters are consistent with  [facility]                                                                              Kersey, is a 72 y.o. male, DOB - [birth year], [id number] Admit date - 07/10/2023    Outpatient Primary MD for the patient is Morene Crocker, MD  LOS - 18  days  Chief Complaint  Patient presents with   Weakness       Brief summary   72 year old M with HTN, HLD, and prostate cancer, recently admitted for generalized weakness, found to have new metastatic neuroendocrine tumor to liver and adrenals.   Was discharged home with plans for outpatient chemo start, but became profoundly weak, returned to the hospital   In the ER, mild transaminitis due to tumor, mild hypokalemia, WBC normal, no fever.  UA with pyuria. 10/6: Started on CTX, admitted for weakness, Xtandi held 10/7:  Oncology consulted, planned for inpatient chemo 10/20: Acute hypoxic respiratory failure with increasing O2 requirements, transfer to stepdown, placed on Lasix, albumin, BiPAP 10/23: Escalating O2 needs, on 15 L O2 HFNC and BiPAP, PCCM consulted 10/25: On BiPAP overnight and NRB daily, platelets 11K.  Oncology, palliative medicine following  Assessment & Plan    Principal Problem: Septic shock, lactic acidosis, UTI -Urine culture showed Klebsiella oxytoca.  Blood culture has been negative.   -CT scan abdomen pelvis 10/8 that showed multifocal pneumonia, innumerable liver masses consistent with metastatic disease, multiple adrenal masses and ? Pancreatitis  Active Problems:   Acute respiratory failure with hypoxia (HCC) -At baseline, not on any O2 at home -On 10/20, patient was transferred to SDU due to markedly worsened respiratory status and increased O2 requirements. Chest x-ray on 10/20 showed pulmonary edema, atypical infection -CTA chest showed no PE, diffuse airspace opacities noted throughout both lungs consistent with pneumonia or possibly edema.  Hepatic and adrenal  metastatic lesions noted, T10 metastatic lesion. 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  HIT ab's 0.086, SRA pending -Platelets down to 8k today, was transfused 1 platelet pheresis on 10/25.  Per oncology, just a temporary measure, high risk of bleeding, overall poor prognosis -Will await oncology recommendation to transfuse PLTs, Nplate? D/w on call Dr Mosetta Putt, recommended to transfuse 1 unit platelets  Anemia: -Patient underwent EGD on 07/15/2023 -Colonoscopy 10/18 with findings of external hemorrhoids. Per GI, if bleeding occurs, ice pack can help and if bleeding persists, surgical eval may be required -H/H stable    Essential hypertension -BP stable, continue metoprolol 5 mg IV every 6 hours  Acute Transaminitis -Likely due to  [facility]                                                                              Kersey, is a 72 y.o. male, DOB - [birth year], [id number] Admit date - 07/10/2023    Outpatient Primary MD for the patient is Morene Crocker, MD  LOS - 18  days  Chief Complaint  Patient presents with   Weakness       Brief summary   72 year old M with HTN, HLD, and prostate cancer, recently admitted for generalized weakness, found to have new metastatic neuroendocrine tumor to liver and adrenals.   Was discharged home with plans for outpatient chemo start, but became profoundly weak, returned to the hospital   In the ER, mild transaminitis due to tumor, mild hypokalemia, WBC normal, no fever.  UA with pyuria. 10/6: Started on CTX, admitted for weakness, Xtandi held 10/7:  Oncology consulted, planned for inpatient chemo 10/20: Acute hypoxic respiratory failure with increasing O2 requirements, transfer to stepdown, placed on Lasix, albumin, BiPAP 10/23: Escalating O2 needs, on 15 L O2 HFNC and BiPAP, PCCM consulted 10/25: On BiPAP overnight and NRB daily, platelets 11K.  Oncology, palliative medicine following  Assessment & Plan    Principal Problem: Septic shock, lactic acidosis, UTI -Urine culture showed Klebsiella oxytoca.  Blood culture has been negative.   -CT scan abdomen pelvis 10/8 that showed multifocal pneumonia, innumerable liver masses consistent with metastatic disease, multiple adrenal masses and ? Pancreatitis  Active Problems:   Acute respiratory failure with hypoxia (HCC) -At baseline, not on any O2 at home -On 10/20, patient was transferred to SDU due to markedly worsened respiratory status and increased O2 requirements. Chest x-ray on 10/20 showed pulmonary edema, atypical infection -CTA chest showed no PE, diffuse airspace opacities noted throughout both lungs consistent with pneumonia or possibly edema.  Hepatic and adrenal  metastatic lesions noted, T10 metastatic lesion. 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  HIT ab's 0.086, SRA pending -Platelets down to 8k today, was transfused 1 platelet pheresis on 10/25.  Per oncology, just a temporary measure, high risk of bleeding, overall poor prognosis -Will await oncology recommendation to transfuse PLTs, Nplate? D/w on call Dr Mosetta Putt, recommended to transfuse 1 unit platelets  Anemia: -Patient underwent EGD on 07/15/2023 -Colonoscopy 10/18 with findings of external hemorrhoids. Per GI, if bleeding occurs, ice pack can help and if bleeding persists, surgical eval may be required -H/H stable    Essential hypertension -BP stable, continue metoprolol 5 mg IV every 6 hours  Acute Transaminitis -Likely due to

## 2023-07-30 NOTE — Progress Notes (Signed)
Pharmacy Electrolyte Replacement  Recent Labs:  Recent Labs    07/30/23 0328  K 3.6  MG 2.2  PHOS 2.8  CREATININE 0.52*   Patient diuresing extensively on BID IV Lasix since 10/20 for fluid overload and increased WOB. with great difficulty keeping up with electrolytes. Asked by Dr. Rhona Leavens 10/22 for pharmacy to help with monitoring and electrolyte supplementation. - We are also contributing to fluid overload with all the electrolyte supplementation bags of fluid.  UOP 10/20: 4225 UOP 10/21: 2550 UOP:10/22: 3525 ml UOP 10/23: 2850 ml UOP 10/24: UOP 10/25: again  10/26:  - K 3.6:  - Mg 2.2 WNL - Phos 2.8 - CoCa 8.42  Plan: Regular diet but unable to be off respiratory support to take pills  - HOLD all ORAL supplements due to respiratory status (10/25>>) - Ca Gluconate 2g IV x 1  - Mag Sulfate 1g IV x 1 - Phos: hold today and recheck tomorrow - K IV x 5 runs - BMET, Mg, Phos daily  - When IV Lasix stops or UOP declines, we will have to be vigilant to decrease/stop all these supplements.  Jacob Cicero S. Merilynn Finland, PharmD, BCPS Clinical Staff Pharmacist [email or web address] 07/30/2023 9:07 AM

## 2023-07-30 NOTE — Plan of Care (Signed)
  Problem: Coping: Goal: Ability to adjust to condition or change in health will improve Outcome: Progressing   Problem: Health Behavior/Discharge Planning: Goal: Ability to identify and utilize available resources and services will improve Outcome: Progressing Goal: Ability to manage health-related needs will improve Outcome: Progressing   Problem: Nutritional: Goal: Maintenance of adequate nutrition will improve Outcome: Progressing Goal: Progress toward achieving an optimal weight will improve Outcome: Progressing   Problem: Education: Goal: Knowledge of General Education information will improve Description: Including pain rating scale, medication(s)/side effects and non-pharmacologic comfort measures Outcome: Progressing

## 2023-07-31 DIAGNOSIS — C801 Malignant (primary) neoplasm, unspecified: Secondary | ICD-10-CM | POA: Diagnosis not present

## 2023-07-31 DIAGNOSIS — J810 Acute pulmonary edema: Secondary | ICD-10-CM | POA: Diagnosis not present

## 2023-07-31 DIAGNOSIS — R579 Shock, unspecified: Secondary | ICD-10-CM | POA: Diagnosis not present

## 2023-07-31 DIAGNOSIS — J9601 Acute respiratory failure with hypoxia: Secondary | ICD-10-CM | POA: Diagnosis not present

## 2023-07-31 DIAGNOSIS — Z711 Person with feared health complaint in whom no diagnosis is made: Secondary | ICD-10-CM

## 2023-07-31 DIAGNOSIS — Z79899 Other long term (current) drug therapy: Secondary | ICD-10-CM

## 2023-07-31 DIAGNOSIS — N3000 Acute cystitis without hematuria: Secondary | ICD-10-CM | POA: Diagnosis not present

## 2023-07-31 DIAGNOSIS — Z515 Encounter for palliative care: Secondary | ICD-10-CM | POA: Diagnosis not present

## 2023-07-31 DIAGNOSIS — R5383 Other fatigue: Secondary | ICD-10-CM | POA: Diagnosis not present

## 2023-07-31 DIAGNOSIS — R531 Weakness: Secondary | ICD-10-CM | POA: Diagnosis not present

## 2023-07-31 LAB — BASIC METABOLIC PANEL
Anion gap: 11 (ref 5–15)
Anion gap: 13 (ref 5–15)
BUN: 28 mg/dL — ABNORMAL HIGH (ref 8–23)
BUN: 25 mg/dL — ABNORMAL HIGH (ref 8–23)
CO2: 31 mmol/L (ref 22–32)
CO2: 30 mmol/L (ref 22–32)
Calcium: 6.9 mg/dL — ABNORMAL LOW (ref 8.9–10.3)
Calcium: 7.3 mg/dL — ABNORMAL LOW (ref 8.9–10.3)
Chloride: 98 mmol/L (ref 98–111)
Chloride: 96 mmol/L — ABNORMAL LOW (ref 98–111)
Creatinine, Ser: 0.51 mg/dL — ABNORMAL LOW (ref 0.61–1.24)
Creatinine, Ser: 0.53 mg/dL — ABNORMAL LOW (ref 0.61–1.24)
GFR, Estimated: >60 mL/min (ref >60)
GFR, Estimated: >60 mL/min (ref >60)
Glucose, Bld: 67 mg/dL — ABNORMAL LOW (ref 70–99)
Glucose, Bld: 106 mg/dL — ABNORMAL HIGH (ref 70–99)
Potassium: 3.2 mmol/L — ABNORMAL LOW (ref 3.5–5.1)
Potassium: 3.5 mmol/L (ref 3.5–5.1)
Sodium: 140 mmol/L (ref 135–145)
Sodium: 139 mmol/L (ref 135–145)

## 2023-07-31 LAB — CBC
HCT: 25.1 % — ABNORMAL LOW (ref 39.0–52.0)
Hemoglobin: 8.2 g/dL — ABNORMAL LOW (ref 13.0–17.0)
MCH: 30.5 pg (ref 26.0–34.0)
MCHC: 32.7 g/dL (ref 30.0–36.0)
MCV: 93.3 fL (ref 80.0–100.0)
Platelets: 10 10*3/uL — CL (ref 150–400)
RBC: 2.69 MIL/uL — ABNORMAL LOW (ref 4.22–5.81)
RDW: 16.7 % — ABNORMAL HIGH (ref 11.5–15.5)
WBC: 6.9 10*3/uL (ref 4.0–10.5)
nRBC: 1.6 % — ABNORMAL HIGH (ref 0.0–0.2)

## 2023-07-31 LAB — GLUCOSE, CAPILLARY
Glucose-Capillary: 64 mg/dL — ABNORMAL LOW (ref 70–99)
Glucose-Capillary: 158 mg/dL — ABNORMAL HIGH (ref 70–99)
Glucose-Capillary: 128 mg/dL — ABNORMAL HIGH (ref 70–99)
Glucose-Capillary: 204 mg/dL — ABNORMAL HIGH (ref 70–99)
Glucose-Capillary: 70 mg/dL (ref 70–99)
Glucose-Capillary: 96 mg/dL (ref 70–99)
Glucose-Capillary: 65 mg/dL — ABNORMAL LOW (ref 70–99)

## 2023-07-31 LAB — PHOSPHORUS: Phosphorus: 2.3 mg/dL — ABNORMAL LOW (ref 2.5–4.6)

## 2023-07-31 LAB — MAGNESIUM: Magnesium: 2.1 mg/dL (ref 1.7–2.4)

## 2023-07-31 MED ORDER — DEXTROSE 50 % IV SOLN
12.5000 g | INTRAVENOUS | Status: AC
  Administered 2023-07-31: 12.5 g via INTRAVENOUS
  Filled 2023-07-31: qty 50

## 2023-07-31 MED ORDER — CALCIUM GLUCONATE-NACL 2-0.675 GM/100ML-% IV SOLN
2.0000 g | Freq: Once | INTRAVENOUS | Status: AC
  Administered 2023-07-31: 2000 mg via INTRAVENOUS
  Filled 2023-07-31: qty 100

## 2023-07-31 MED ORDER — CALCIUM GLUCONATE-NACL 2-0.675 GM/100ML-% IV SOLN
2.0000 g | Freq: Every day | INTRAVENOUS | Status: DC
  Administered 2023-07-31 – 2023-08-03 (×4): 2000 mg via INTRAVENOUS
  Filled 2023-07-31 (×5): qty 100

## 2023-07-31 MED ORDER — MAGNESIUM SULFATE IN D5W 1-5 GM/100ML-% IV SOLN
1.0000 g | Freq: Every day | INTRAVENOUS | Status: DC
  Administered 2023-07-31 – 2023-08-03 (×4): 1 g via INTRAVENOUS
  Filled 2023-07-31 (×5): qty 100

## 2023-07-31 MED ORDER — POTASSIUM CHLORIDE 10 MEQ/100ML IV SOLN
10.0000 meq | INTRAVENOUS | Status: AC
  Administered 2023-07-31 – 2023-08-01 (×4): 10 meq via INTRAVENOUS
  Filled 2023-07-31 (×5): qty 100

## 2023-07-31 MED ORDER — POTASSIUM PHOSPHATES 15 MMOLE/5ML IV SOLN
30.0000 mmol | Freq: Once | INTRAVENOUS | Status: AC
  Administered 2023-07-31: 30 mmol via INTRAVENOUS
  Filled 2023-07-31: qty 10

## 2023-07-31 MED ORDER — SODIUM CHLORIDE 0.9% IV SOLUTION: Freq: Once | INTRAVENOUS | Status: AC

## 2023-07-31 MED ORDER — METHYLPREDNISOLONE SODIUM SUCC 125 MG IJ SOLR
60.0000 mg | INTRAMUSCULAR | Status: DC
  Administered 2023-08-01 – 2023-08-04 (×4): 60 mg via INTRAVENOUS
  Filled 2023-07-31 (×4): qty 2

## 2023-07-31 MED ORDER — POTASSIUM CHLORIDE 10 MEQ/100ML IV SOLN
10.0000 meq | INTRAVENOUS | Status: DC
  Administered 2023-07-31 (×3): 10 meq via INTRAVENOUS
  Filled 2023-07-31 (×3): qty 100

## 2023-07-31 MED ORDER — HYDROMORPHONE HCL 1 MG/ML IJ SOLN
1.0000 mg | INTRAMUSCULAR | Status: DC | PRN
  Administered 2023-07-31 – 2023-08-01 (×5): 2 mg via INTRAVENOUS
  Filled 2023-07-31 (×6): qty 2

## 2023-07-31 MED ORDER — POTASSIUM CHLORIDE 10 MEQ/100ML IV SOLN
10.0000 meq | INTRAVENOUS | Status: AC
  Administered 2023-07-31 (×2): 10 meq via INTRAVENOUS
  Filled 2023-07-31 (×2): qty 100

## 2023-07-31 NOTE — Plan of Care (Signed)
  Problem: Education: Goal: Ability to describe self-care measures that may prevent or decrease complications (Diabetes Survival Skills Education) will improve Outcome: Progressing Goal: Individualized Educational Video(s) Outcome: Progressing   Problem: Coping: Goal: Ability to adjust to condition or change in health will improve 07/31/2023 1900 by Beatrix Shipper, RN Outcome: Progressing 07/31/2023 1900 by Beatrix Shipper, RN Outcome: Progressing   Problem: Fluid Volume: Goal: Ability to maintain a balanced intake and output will improve 07/31/2023 1900 by Beatrix Shipper, RN Outcome: Progressing 07/31/2023 1900 by Beatrix Shipper, RN Outcome: Progressing   Problem: Health Behavior/Discharge Planning: Goal: Ability to identify and utilize available resources and services will improve Outcome: Progressing Goal: Ability to manage health-related needs will improve Outcome: Progressing   Problem: Metabolic: Goal: Ability to maintain appropriate glucose levels will improve Outcome: Progressing

## 2023-07-31 NOTE — Progress Notes (Signed)
RT called to place pt on Bipap due to desaturation and WOB of the pt.  Pt was on Bipap upon entering room.  FIO2 increased to 100% from 60% to maintain sats above 90.  Mask adjusted. Pt is tolerating well.

## 2023-07-31 NOTE — Progress Notes (Addendum)
Daily Progress Note   Patient Name: William Jones       Date: 07/31/2023 DOB: 1951/03/16  Age: 72 y.o. MRN#: [id number] Attending Physician: Cathren Harsh, MD Primary Care Physician: Morene Crocker, MD Admit Date: 07/23/2023 Length of Stay: 19 days  Reason for Consultation/Follow-up: Establishing goals of care  Subjective:   CC: Patient having pain and shortness of breath. Following up regarding complex medical decision making.   Subjective:  Reviewed EMR prior to presenting to bedside.  Discussed care with RN for updates.  Patient's respiratory status continues to worsen.  When patient attempts removal of nonrebreather for any food, quickly desaturates, has been unable to eat.  Even drinking liquids causes this problem.  Presented to bedside to see patient with RN.  Patient's daughter's sister (not biologically related to patient though related to daughter via mothers) was present at bedside.  Again introduced myself as though we had met a couple days ago when discussing patient's care.  Inquired how patient was feeling at this time.  Patient appears visibly uncomfortable and agitated.  Patient notes he is having worsening back pain and shortness of breath.  Patient unable to sleep well.  Discussed this is all related to his respiratory status and worsening physical condition.  Again noted medications are available as needed for pain and shortness of breath.  Patient requesting a dose now which RN was able to provide the patient had minimal improvement.  Noted could adjust these doses which patient acknowledged. Discussed care with patient's daughters sister at bedside as well.  Inquired if patient's biological daughter would be coming to bedside today and she felt she would be coming in later.  We discussed that patient clinically looks worse and uncomfortable at this point.  Again discussed allowing medications for comfort knowing he has a medical condition we cannot reverse.   Encouraged conversations with daughter about this as patient has been "holding on" for his daughter.  Patient appears visibly distressed and is reaching end-of-life.  Questions as able for patient and her.  Noted palliative medicine team will continue to follow along with patient's medical journey.  RN informed later in day that patient having worsening pain and dyspnea without improvement of dose of IV Dilaudid so will increase as needed dose of IV dilaudid as per patient's request.  Objective:   Vital Signs:  BP (!) 148/77 (BP Location: Right Arm)   Pulse (!) 106   Temp 97.8 F (36.6 C) (Axillary)   Resp (!) 29   Ht 5\' 9"  (1.753 m)   Wt 64.3 kg   SpO2 98%   BMI 20.93 kg/m   Physical Exam: General: increased work of breathing noted, alert, laying in bed, chronically ill appearing, frail, cachectic, appears visibly uncomfortable HENT: dry mucous membranes Cardiovascular: Tachycardia noted Respiratory: increased work of breathing noted on HFNC and non re breather, using accessory muscles extensively to help breathe  Skin: no rashes or lesions on visible skin Neuro: Appropriately answering questions though takes time due to his shortness of breath Psych: Appears agitated due to discomfort and shortness of breath  Imaging: I personally reviewed recent imaging.   Assessment & Plan:   Assessment: Patient is a 72yo male with a PMHx of HTN, HLD, prostate adenocarcinoma, and recent diagnosis of small cell carcinoma either with primary liver diagnosis or is secondary who was admitted on 07/09/2023 for management of worsening weakness. During hospitalization patient has received management for septic shock secondary to UTI, acute respiratory failure, and electrolyte  imbalances. Oncology and PCCM consulted. Palliative medicine team consulted to assist with complex medical decision making.   Recommendations/Plan: # Complex medical decision making/goals of care:     -Again discussed with patient  that despite all aggressive medical interventions, his body is medically deteriorating, specifically his respiratory status.  Recommendation for comfort focused care has been made many times by multiple providers. Patient has been reluctant to agree to full comfort transition because he daughter does not want him too, despite his worsening symptom burden.   Patient now appears to be visibly suffering due to symptom burden at the end of life.                -Patient already gave permission to that his daughter should be called first with updates and then his friend/girlfriend, [name].                 -  Code Status: Limited: Do not attempt resuscitation (DNR) -DNR-LIMITED -Do Not Intubate/DNI     # Symptom management                -Pain/dyspnea, in setting of small cell carcinoma   -Increase IV dilaudid to 1-2mg  q3hrs prn   # Psycho-social/Spiritual Support:  - Support System: daughter (primary contact/NOK), friend/girlfriend, patient's daughter's sister (not biologically related to patient though related to daughter via mother) -Already reached out to chaplains about spiritual support  # Discharge Planning: To Be Determined  Thank you for allowing the palliative care team to participate in the care [facility].  Alvester Morin, DO Palliative Care Provider PMT # [phone]  If patient remains symptomatic despite maximum doses, please call PMT at [phone] between 0700 and 1900. Outside of these hours, please call attending, as PMT does not have night coverage.  UPDATE: Presented to bedside later on in afternoon when informed patient's daughter was present. Patient's medical status has continued to deteriorate and now asked to be put on BiPap though has stated he didn't want it. Bipap ordered for palliation.  Spoke with daughter at bedside. Daughter now requesting patient be transferred to [facility]. Inquired what she was hoping for with transfer and she just wants "a second  opinion". Informed her that patient is too ill for cancer directed therapies at this time. He can't even eat or drink safety. Discussed that he may not even survive a transfer. Daughter again demanded [facility] be called about transfer. Acknowledged this and noted would inform hosptialist who would be in charge of this referral process.  Also explained what patient has been going through today with worsening pain, shortness of breath, and anxiety because his body is failing. Patient will continue going through this why referrals are being made. Expressed concern that at this point patient is going to die though we can help him medically by alleviating suffering. Daughter she hears this though does not agree with transition to comfort and wants referral to [facility]. Informed hosptialist of request for transfer.    Personally spent 65 minutes in patient care including extensive chart review (labs, imaging, progress/consult notes, vital signs), medically appropraite exam, discussed with treatment team, education to patient, family, and staff, documenting clinical information, medication review and management, coordination of care, and available advanced directive documents.   *Please note that this is a verbal dictation therefore any spelling or grammatical errors are due to the "[employer] One" system interpretation.

## 2023-07-31 NOTE — Plan of Care (Signed)
  Problem: Coping: Goal: Ability to adjust to condition or change in health will improve Outcome: Progressing   Problem: Fluid Volume: Goal: Ability to maintain a balanced intake and output will improve Outcome: Progressing   Problem: Health Behavior/Discharge Planning: Goal: Ability to identify and utilize available resources and services will improve Outcome: Progressing Goal: Ability to manage health-related needs will improve Outcome: Progressing   

## 2023-07-31 NOTE — Progress Notes (Signed)
[facility]                                                                              [place], is a 72 y.o. male, DOB - 08-18-1951, ZOX:[id number] Admit date - 07/08/2023    Outpatient Primary MD for the patient is Morene Crocker, MD  LOS - 19  days  Chief Complaint  Patient presents with   Weakness       Brief summary   72 year old M with HTN, HLD, and prostate cancer, recently admitted for generalized weakness, found to have new metastatic neuroendocrine tumor to liver and adrenals.   Was discharged home with plans for outpatient chemo start, but became profoundly weak, returned to the hospital   In the ER, mild transaminitis due to tumor, mild hypokalemia, WBC normal, no fever.  UA with pyuria. 10/6: Started on CTX, admitted for weakness, Xtandi held 10/7:  Oncology consulted, planned for inpatient chemo 10/20: Acute hypoxic respiratory failure with increasing O2 requirements, transfer to stepdown, placed on Lasix, albumin, BiPAP 10/23: Escalating O2 needs, on 15 L O2 HFNC and BiPAP, PCCM consulted 10/25: On BiPAP overnight and NRB daily, platelets 11K.  Oncology, palliative medicine following  Assessment & Plan    Principal Problem: Septic shock, lactic acidosis, UTI -Urine culture showed Klebsiella oxytoca.  Blood culture has been negative.   -CT scan abdomen pelvis 10/8 that showed multifocal pneumonia, innumerable liver masses consistent with metastatic disease, multiple adrenal masses and ?Pancreatitis  Active Problems:   Acute respiratory failure with hypoxia (HCC) -At baseline, not on any O2 at home -On 10/20, patient was transferred to SDU due to markedly worsened respiratory status and increased O2 requirements. Chest x-ray on 10/20 showed pulmonary edema, atypical infection -CTA chest showed no PE, diffuse airspace opacities noted throughout both lungs consistent with pneumonia or possibly edema.  Hepatic and adrenal  metastatic lesions noted, T10 metastatic lesion. 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  HIT ab's 0.086, SRA pending -Transfused 1 unit platelets on 10/25, 10/26, platelets improved to 17K, down to 10K again, will transfuse 1 unit.  Oncology following.  Anemia: -Patient underwent EGD on 07/09/2023 -Colonoscopy 08-10-23 with findings of external hemorrhoids. Per GI, if bleeding occurs, ice pack can help and if bleeding persists, surgical eval may be required -H/H stable    Essential hypertension -Continue metoprolol 5 mg every 6 hours   Acute Transaminitis -Likely due to shock liver on presentation, improving    Prostate cancer metastatic to bone (HCC) -Xtandi is on hold for now while in hospital  Hypokalemia: -Replace as needed   Hypophosphatemia: -  Replaced with IV K-Phos, pharmacy assisting with electrolyte replacement    Hypomagnesemia: -Currently stable   Hypocalcemia: -likely due to chronic illness   -improving, continue to replace     Moderate protein calorie malnutrition Nutrition Problem: Moderate Malnutrition Etiology: chronic illness, cancer and cancer related treatments Signs/Symptoms: mild fat depletion, moderate muscle depletion, percent weight loss Interventions: Ensure Enlive (each supplement provides 350kcal and 20 grams of protein), Education  Estimated body mass  index is 20.93 kg/m as calculated from the following:   Height as of this encounter: 5\' 9"  (1.753 m).   Weight as of this encounter: 64.3 kg.  Code Status: full code DVT Prophylaxis:  Place and maintain sequential compression device Start: 07/14/23 1101   Level of Care: Level of care: Stepdown Family Communication: Updated patient's daughter at the bedside today 10/27 Disposition Plan:      Remains inpatient appropriate:      Procedures:    Consultants:   Pulm CCM Oncology Palliative medicine  Antimicrobials:   Anti-infectives (From admission, onward)    Start     Dose/Rate Route Frequency Ordered Stop   07/29/23 1615  piperacillin-tazobactam (ZOSYN) IVPB 3.375 g        3.375 g 12.5 mL/hr over 240 Minutes Intravenous Every 8 hours 07/29/23 1525     07/29/23 1200  vancomycin (VANCOREADY) IVPB 750 mg/150 mL  Status:  Discontinued        750 mg 150 mL/hr over 60 Minutes Intravenous Every 12 hours 07/28/23 1309 07/29/23 1518   07/28/23 1400  ceFEPIme (MAXIPIME) 2 g in sodium chloride 0.9 % 100 mL IVPB  Status:  Discontinued        2 g 200 mL/hr over 30 Minutes Intravenous Every 8 hours 07/28/23 1302 07/29/23 1518   07/27/23 2100  ceFEPIme (MAXIPIME) 2 g in sodium chloride 0.9 % 100 mL IVPB        2 g 200 mL/hr over 30 Minutes Intravenous Every 8 hours 07/27/23 1259 07/28/23 0618   07/27/23 1100  vancomycin (VANCOREADY) IVPB 1750 mg/350 mL  Status:  Discontinued        1,750 mg 175 mL/hr over 120 Minutes Intravenous Every 24 hours 07/27/23 0950 07/28/23 1309   07/27/23 1030  ceFEPIme (MAXIPIME) 2 g in sodium chloride 0.9 % 100 mL IVPB  Status:  Discontinued        2 g 200 mL/hr over 30 Minutes Intravenous Every 8 hours 07/27/23 0937 07/27/23 1259   07/27/23 1030  vancomycin (VANCOREADY) IVPB 1250 mg/250 mL  Status:  Discontinued        1,250 mg 166.7 mL/hr over 90 Minutes Intravenous  Once 07/27/23 0937 07/27/23 0950   07/24/23 1700  cefTRIAXone (ROCEPHIN) 2 g in sodium  chloride 0.9 % 100 mL IVPB  Status:  Discontinued        2 g 200 mL/hr over 30 Minutes Intravenous Daily 07/24/23 1630 07/25/23 1453   07/24/23 1700  azithromycin (ZITHROMAX) 500 mg in sodium chloride 0.9 % 250 mL IVPB  Status:  Discontinued        500 mg 250 mL/hr over 60 Minutes Intravenous Daily 07/24/23 1630 07/25/23 1453   07/17/23 1200  Ampicillin-Sulbactam (UNASYN) 3 g in sodium chloride 0.9 % 100 mL IVPB        3 g 200 mL/hr over 30 Minutes Intravenous Every 6 hours 07/17/23 1148 07/18/23 0049   07/14/23 1200  Ampicillin-Sulbactam (UNASYN) 3 g in sodium chloride 0.9 % 100 mL IVPB  Status:  Discontinued  3 g 200 mL/hr over 30 Minutes Intravenous Every 6 hours 07/14/23 1028 07/17/23 1148   07/13/23 0300  vancomycin (VANCOREADY) IVPB 750 mg/150 mL  Status:  Discontinued        750 mg 150 mL/hr over 60 Minutes Intravenous Every 12 hours 07/12/23 1403 07/14/23 0830   07/12/23 1430  vancomycin (VANCOREADY) IVPB 1500 mg/300 mL        1,500 mg 150 mL/hr over 120 Minutes Intravenous  Once 07/12/23 1402 07/12/23 1826   07/12/23 1200  meropenem (MERREM) 1 g in sodium chloride 0.9 % 100 mL IVPB  Status:  Discontinued        1 g 200 mL/hr over 30 Minutes Intravenous Every 8 hours 07/12/23 1102 07/14/23 1028   07/11/23 1800  cefTRIAXone (ROCEPHIN) 1 g in sodium chloride 0.9 % 100 mL IVPB  Status:  Discontinued        1 g 200 mL/hr over 30 Minutes Intravenous Every 24 hours 07/20/2023 1911 07/12/23 1102   07/19/2023 1800  cefTRIAXone (ROCEPHIN) 1 g in sodium chloride 0.9 % 100 mL IVPB        1 g 200 mL/hr over 30 Minutes Intravenous  Once 07/26/2023 1745 07/16/2023 2132          Medications  sodium chloride   Intravenous Once   Chlorhexidine Gluconate Cloth  6 each Topical Q2200   dextrose  12.5 g Intravenous STAT   docusate sodium  100 mg Oral BID   dorzolamide  1 drop Right Eye BID   feeding supplement  1 Container Oral TID BM   furosemide  60 mg Intravenous BID   Gerhardt's butt  cream   Topical BID   insulin aspart  0-15 Units Subcutaneous TID WC   insulin aspart  0-5 Units Subcutaneous QHS   insulin aspart  4 Units Subcutaneous TID WC   insulin glargine-yfgn  11 Units Subcutaneous Daily   methylPREDNISolone (SOLU-MEDROL) injection  60 mg Intravenous Q12H   metoprolol tartrate  5 mg Intravenous Q6H   mouth rinse  15 mL Mouth Rinse 4 times per day   pantoprazole (PROTONIX) IV  40 mg Intravenous Q12H   sodium chloride flush  3 mL Intravenous Q12H   timolol  1 drop Right Eye BID      Subjective:   William Jones was seen and examined today.  Respiratory status remains tenuous, on nonrebreather, 15 L O2 HFNC.   Daughter at the bedside.  States pain in the back, no fevers or chills, abdominal pain, nausea vomiting  Vitals:   07/31/23 0450 07/31/23 0700 07/31/23 0800 07/31/23 1027  BP:  (!) 146/78 (!) 148/77   Pulse:      Resp:  20 (!) 29   Temp:    97.8 F (36.6 C)  TempSrc:    Axillary  SpO2:      Weight: 64.3 kg     Height:        Intake/Output Summary (Last 24 hours) at 07/31/2023 1132 Last data filed at 07/31/2023 0800 Gross per 24 hour  Intake 1281.64 ml  Output 2400 ml  Net -1118.36 ml     Wt Readings from Last 3 Encounters:  07/31/23 64.3 kg  07/07/23 68.3 kg  07/05/23 71.1 kg    Physical Exam General: Alert and oriented, on NRB, frail and ill-appearing Cardiovascular: S1 S2 clear, RRR.  Respiratory: Diminished breath sounds Gastrointestinal: Soft, nontender, nondistended, NBS Ext: no pedal edema bilaterally Neuro: no new deficits Psych:   Data Reviewed:  I have personally reviewed following labs    CBC Lab Results  Component Value Date   WBC 6.9 07/31/2023   RBC 2.69 (L) 07/31/2023   HGB 8.2 (L) 07/31/2023   HCT 25.1 (L) 07/31/2023   MCV 93.3 07/31/2023   MCH 30.5 07/31/2023   PLT 10 (LL) 07/31/2023   MCHC 32.7 07/31/2023   RDW 16.7 (H) 07/31/2023   LYMPHSABS 0.4 (L) 07/30/2023   MONOABS 0.2 07/30/2023   EOSABS  0.0 07/30/2023   BASOSABS 0.0 07/30/2023     Last metabolic panel Lab Results  Component Value Date   NA 140 07/31/2023   K 3.2 (L) 07/31/2023   CL 98 07/31/2023   CO2 31 07/31/2023   BUN 28 (H) 07/31/2023   CREATININE 0.51 (L) 07/31/2023   GLUCOSE 67 (L) 07/31/2023   GFRNONAA >60 07/31/2023   GFRAA 103 12/18/2019   CALCIUM 6.9 (L) 07/31/2023   PHOS 2.3 (L) 07/31/2023   PROT 5.1 (L) 07/26/2023   ALBUMIN 2.1 (L) 07/26/2023   LABGLOB 2.4 04/10/2019   AGRATIO 1.6 04/10/2019   BILITOT 0.9 07/26/2023   ALKPHOS 263 (H) 07/26/2023   AST 39 07/26/2023   ALT 64 (H) 07/26/2023   ANIONGAP 11 07/31/2023    CBG (last 3)  Recent Labs    07/31/23 0445 07/31/23 0616 07/31/23 0848  GLUCAP 64* 158* 128*      Coagulation Profile: No results for input(s): "INR", "PROTIME" in the last 168 hours.   Radiology Studies: I have personally reviewed the imaging studies  No results found.     Thad Ranger M.D. [facility] 07/31/2023, 11:32 AM  Available via Epic secure chat 7am-7pm After 7 pm, please refer to night coverage provider listed on amion.

## 2023-07-31 NOTE — Progress Notes (Signed)
NAME:  William Jones, MRN:  [id number], DOB:  01-06-1951, LOS: 19 ADMISSION DATE:  09-Aug-2023, CONSULTATION DATE:  10/8 REFERRING MD:  Dr. Maryfrances Bunnell, CHIEF COMPLAINT:  hypotension, lactic acidosis   History of Present Illness:  72 year old male with PMH as below, which is significant for prostate cancer, substance abuse, GI bleeding, HTN, and HLD. He takes Xtandi for metastatic prostate Ca. Recently admitted for weakness and was found to have diffuse hepatic metastatic disease and metastasis to the adrenal glands as well. 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 Primary vs mets uncertain. He was discharged and followed up with oncology 10/3 with plans to start carboplatin and etoposide 10/8, but unfortunately he once again became weak and presented to [facility] ED 08-09-23. He was concerned he would be unable to get the care he needed at home and would miss appointments due to feeling weak and was hopeful to be admitted to the hospital. He was admitted to the hospital for generalized weakness and possible UTI. He was seen by oncology inpatient and was scheduled to start inpatient chemotherapy, however, in the AM hours of 10/8 he became tachycardic and labs indicated worsening acidosis. Lactic acid was check and was found to be greater than 9. The patient was transferred to ICU for closer monitoring and PCCM was consulted. The patient was treated with IVF. BP and LA improved. PCCM signed off.   Subsequent course has been complicated by klebsiella UTI, anemia, and hypoxia. Anemia was evaluated by GI with upper and lower scopes. Lower scope found hemorrhoids but no clear evidence of bleeding. Hypoxia has been progressive. CXR was concerning for PNA vs edema and he has been treated with CAP antibiotics as well as diuresis, but hypoxia continued to worsen. PCCM called back 10/23 for hypoxia. DNR/DNI. Daughter updated.   Pertinent  Medical History   has a past medical  history of Angiodysplasia of colon with hemorrhage (10/14/2020), Duodenitis, Essential hypertension (07/16/2015), Fatigue (11/06/2020), Glaucoma, H/O: substance abuse (HCC), Hemorrhage of rectum and anus (10/14/2020), History of frostbite, History of prostate cancer (03/20/2014), Hyperlipidemia (11/20/2019), Iron deficiency anemia, Malignant tumor of prostate (HCC) (10/14/2020), Peripheral neuropathy, S/P radiation therapy (09/18/14-11/15/14), and Thrombocytopenia (HCC).   Significant Hospital Events: Including procedures, antibiotic start and stop dates in addition to other pertinent events   08/09/23 admit for generalized weakness 10/8 tx to ICU for LA 9  improved with volume 10/11 EGD erosive gastropathy with no bleeding stigmata 10/18 colonoscopy with diverticulosis and external hemorrhoids, no bleeding.  10/23 PCCM consult for hypoxia CT no obvious PE. Diffuse airspace opacities concerning for edema or infection.  10/24 Breathing a little better this morning, on 12L [place] and 15L nRB. BiPAP intermittently overnight.  10/25 Unable to tolerate BIPAP overnight, remains on [place] and NRB  Interim History / Subjective:   10/27 - still gets platelets. More desats. BiPAP restarted. Did not use it last night  Objective   Blood pressure (!) 144/78, pulse 82, temperature 97.8 F (36.6 C), temperature source Axillary, resp. rate 16, height 5\' 9"  (1.753 m), weight 64.3 kg, SpO2 100%.    FiO2 (%):  [100 %] 100 %   Intake/Output Summary (Last 24 hours) at 07/31/2023 1425 Last data filed at 07/31/2023 0800 Gross per 24 hour  Intake 959.01 ml  Output 1600 ml  Net -640.99 ml   Filed Weights   07/29/23 0500 07/30/23 0243 07/31/23 0450  Weight: 62.1 kg 62.1 kg 64.3 kg    Examination:  CAchectic male On FAce Mask o2 Tachypenic Looks abit worse x 48h Alert but less Oriented x 3  Resolved Hospital Problem list   UTI - klebsiella oxytoca Lactic acidosis  Assessment & Plan:   Acute respiratory  failure with hypoxia: Most consistent with pulmonary edema, but not really improving with diuresis. CTA negative for PE. Diffuse opacities.   10/27 - slowly progressive resp failure. Not fullly doing BiPAP. DNI now  P: Continue supplemental oxygen wean for sat goal > 92% BIPAP during day rest and at night  - re-emphasized to him, family and bedside nurse Continue to diureses as able  Aspiration precautions  Continue IV steroids but reduce to solumedrol once daily  - reduce and wean over next 1-2 weeks per [facility] - can be done clinically Remains on empiric cefepime and vancomycin   Malignant neuroendocrine carcinoma with metastatics to the liver and adrenals    Prostate cancer (adeno) with bone mets P: Management per oncology - Hospice recommended  Trend LFTs  Supportive care   Anemia  -s/p EGD and colonoscopy with no evidence of bleeding. Erosive gastropathy and external hemorrhoids  Severe thrombocytopenia  Moderate protein calorie malnutrition  Other issues  - per [facility]   As of 10/27 - full medical care but DNR/DNI  CCM has very little to add - will sign off    Best Practice (right click and "Reselect all SmartList Selections" daily)   Per primary   Critical care time:     SIGNATURE    Dr. Kalman Shan, M.D., F.C.C.P,  Pulmonary and Critical Care Medicine Staff Physician, [facility] Center Director - Interstitial Lung Disease  Program  Pulmonary Fibrosis [facility] at [facility] [place], [place], [address]   Pager: [phone], If no answer  -> Check AMION or Try [phone] Telephone (clinical office): [phone] Telephone (research): [phone]  2:32 PM 07/31/2023

## 2023-07-31 NOTE — Progress Notes (Signed)
Pharmacy Electrolyte Replacement  Recent Labs:  Recent Labs    07/31/23 0318  K 3.2*  MG 2.1  PHOS 2.3*  CREATININE 0.51*   Patient diuresing extensively on BID IV Lasix since 10/20 for fluid overload and increased WOB. with great difficulty keeping up with electrolytes. Asked by Dr. Rhona Leavens 10/22 for pharmacy to help with monitoring and electrolyte supplementation. - We are also contributing to fluid overload with all the electrolyte supplementation bags of fluid.  UOP 10/20: 4225 UOP 10/21: 2550 UOP:10/22: 3525 ml UOP 10/23: 2850 ml UOP 10/24: UOP 10/25: again UOP 10/26: 1900 ml  10/27:  - K 3.2:  - Mg 2.1 WNL - Phos 2.3 - CoCa 8.42  Plan: Regular diet but unable to be off respiratory support to take pills  - HOLD all ORAL supplements due to respiratory status (10/25>>) - Ca Gluconate 2g IV daily scheduled - Mag Sulfate 1g IV daily  scheduled - Phos: KPhos IV x 1 (this is extra fluid) - K IV x 5 runs - BMET, Mg, Phos daily  - When IV Lasix stops or UOP declines, we will have to be vigilant to decrease/stop all these supplements.  Cybele Maule S. Merilynn Finland, PharmD, BCPS Clinical Staff Pharmacist [email or web address] 07/31/2023 8:24 AM

## 2023-07-31 NOTE — Progress Notes (Signed)
Pharmacy - Electrolyte replacement  Assessment:    Please see note from Misty Stanley, PharmD earlier today for full details.  Briefly, 72 y.o. male with severe electrolyte disturbances d/t diuresis. Pharmacy has been asked to assist with electrolyte replacement. Now unable to take POs d/t respiratory status (note that IV lytes are directly contributing to fluid overload)  PM labs notable for: K 3.5 (up from 3.2 after 90 mEq given this AM) Ca 7.3 (up from 6.9; corrects to 8.9 - lower limit of normal - based on Albumin 2.0, but noted patient has been requiring 2g Ca gluconate/24 hr just to maintain levels)  Plan:  KCl 10 mEq IV x 5 Ca Gluconate 2g IV x 1 F/u UOP, Bmet in AM  Bernadene Person, PharmD, BCPS [phone] 07/31/2023, 8:08 PM

## 2023-08-01 DIAGNOSIS — R531 Weakness: Secondary | ICD-10-CM | POA: Diagnosis not present

## 2023-08-01 DIAGNOSIS — J9601 Acute respiratory failure with hypoxia: Secondary | ICD-10-CM | POA: Diagnosis not present

## 2023-08-01 DIAGNOSIS — Z515 Encounter for palliative care: Secondary | ICD-10-CM | POA: Diagnosis not present

## 2023-08-01 DIAGNOSIS — R5383 Other fatigue: Secondary | ICD-10-CM | POA: Diagnosis not present

## 2023-08-01 DIAGNOSIS — Z711 Person with feared health complaint in whom no diagnosis is made: Secondary | ICD-10-CM | POA: Diagnosis not present

## 2023-08-01 DIAGNOSIS — Z79899 Other long term (current) drug therapy: Secondary | ICD-10-CM | POA: Diagnosis not present

## 2023-08-01 DIAGNOSIS — N3000 Acute cystitis without hematuria: Secondary | ICD-10-CM | POA: Diagnosis not present

## 2023-08-01 DIAGNOSIS — R579 Shock, unspecified: Secondary | ICD-10-CM | POA: Diagnosis not present

## 2023-08-01 LAB — GLUCOSE, CAPILLARY
Glucose-Capillary: 130 mg/dL — ABNORMAL HIGH (ref 70–99)
Glucose-Capillary: 150 mg/dL — ABNORMAL HIGH (ref 70–99)
Glucose-Capillary: 152 mg/dL — ABNORMAL HIGH (ref 70–99)
Glucose-Capillary: 245 mg/dL — ABNORMAL HIGH (ref 70–99)
Glucose-Capillary: 263 mg/dL — ABNORMAL HIGH (ref 70–99)
Glucose-Capillary: 67 mg/dL — ABNORMAL LOW (ref 70–99)
Glucose-Capillary: 72 mg/dL (ref 70–99)
Glucose-Capillary: 72 mg/dL (ref 70–99)
Glucose-Capillary: 81 mg/dL (ref 70–99)
Glucose-Capillary: 98 mg/dL (ref 70–99)

## 2023-08-01 LAB — CBC
HCT: 25.3 % — ABNORMAL LOW (ref 39.0–52.0)
Hemoglobin: 8.1 g/dL — ABNORMAL LOW (ref 13.0–17.0)
MCH: 30.2 pg (ref 26.0–34.0)
MCHC: 32 g/dL (ref 30.0–36.0)
MCV: 94.4 fL (ref 80.0–100.0)
Platelets: 9 10*3/uL — CL (ref 150–400)
RBC: 2.68 MIL/uL — ABNORMAL LOW (ref 4.22–5.81)
RDW: 16.8 % — ABNORMAL HIGH (ref 11.5–15.5)
WBC: 6.8 10*3/uL (ref 4.0–10.5)
nRBC: 1.3 % — ABNORMAL HIGH (ref 0.0–0.2)

## 2023-08-01 LAB — BPAM PLATELET PHERESIS
Blood Product Expiration Date: 202410282359
Blood Product Expiration Date: 202410292359
Blood Product Expiration Date: 202410292359
ISSUE DATE / TIME: 202410251740
ISSUE DATE / TIME: 202410261337
ISSUE DATE / TIME: 202410271220
Unit Type and Rh: 6200
Unit Type and Rh: 5100
Unit Type and Rh: 5100

## 2023-08-01 LAB — EXPECTORATED SPUTUM ASSESSMENT W GRAM STAIN, RFLX TO RESP C

## 2023-08-01 LAB — PREPARE PLATELET PHERESIS
Unit division: 0
Unit division: 0
Unit division: 0

## 2023-08-01 LAB — BASIC METABOLIC PANEL
Anion gap: 8 (ref 5–15)
BUN: 28 mg/dL — ABNORMAL HIGH (ref 8–23)
CO2: 32 mmol/L (ref 22–32)
Calcium: 7.5 mg/dL — ABNORMAL LOW (ref 8.9–10.3)
Chloride: 99 mmol/L (ref 98–111)
Creatinine, Ser: 0.56 mg/dL — ABNORMAL LOW (ref 0.61–1.24)
GFR, Estimated: >60 mL/min (ref >60)
Glucose, Bld: 74 mg/dL (ref 70–99)
Potassium: 4.2 mmol/L (ref 3.5–5.1)
Sodium: 139 mmol/L (ref 135–145)

## 2023-08-01 LAB — PROCALCITONIN: Procalcitonin: 23.31 ng/mL

## 2023-08-01 LAB — LEGIONELLA PNEUMOPHILA SEROGP 1 UR AG: L. pneumophila Serogp 1 Ur Ag: NEGATIVE

## 2023-08-01 LAB — SEDIMENTATION RATE: Sed Rate: 25 mm/h — ABNORMAL HIGH (ref 0–16)

## 2023-08-01 LAB — BRAIN NATRIURETIC PEPTIDE: B Natriuretic Peptide: 153.6 pg/mL — ABNORMAL HIGH (ref 0.0–100.0)

## 2023-08-01 LAB — C-REACTIVE PROTEIN: CRP: 4.7 mg/dL — ABNORMAL HIGH (ref <1.0)

## 2023-08-01 LAB — PHOSPHORUS: Phosphorus: 3 mg/dL (ref 2.5–4.6)

## 2023-08-01 LAB — MAGNESIUM: Magnesium: 2 mg/dL (ref 1.7–2.4)

## 2023-08-01 LAB — LACTATE DEHYDROGENASE: LDH: 936 U/L — ABNORMAL HIGH (ref 98–192)

## 2023-08-01 MED ORDER — SODIUM CHLORIDE 0.9% IV SOLUTION: Freq: Once | INTRAVENOUS | Status: AC

## 2023-08-01 MED ORDER — POTASSIUM CHLORIDE 10 MEQ/100ML IV SOLN
10.0000 meq | Freq: Once | INTRAVENOUS | Status: AC
  Administered 2023-08-01: 10 meq via INTRAVENOUS

## 2023-08-01 MED ORDER — SULFAMETHOXAZOLE-TRIMETHOPRIM 400-80 MG/5ML IV SOLN
320.0000 mg | Freq: Four times a day (QID) | INTRAVENOUS | Status: DC
  Administered 2023-08-01 – 2023-08-04 (×11): 320 mg via INTRAVENOUS
  Filled 2023-08-01 (×13): qty 20

## 2023-08-01 MED ORDER — SULFAMETHOXAZOLE-TRIMETHOPRIM 400-80 MG/5ML IV SOLN: 20.0000 mg/kg/d | Freq: Three times a day (TID) | INTRAVENOUS | Status: DC

## 2023-08-01 MED ORDER — HYDROMORPHONE HCL 1 MG/ML IJ SOLN
1.0000 mg | INTRAMUSCULAR | Status: DC | PRN
  Administered 2023-08-01 (×2): 1 mg via INTRAVENOUS
  Administered 2023-08-02 – 2023-08-04 (×19): 2 mg via INTRAVENOUS
  Filled 2023-08-01 (×11): qty 2
  Filled 2023-08-01: qty 1
  Filled 2023-08-01 (×2): qty 2
  Filled 2023-08-01: qty 1
  Filled 2023-08-01 (×2): qty 2
  Filled 2023-08-01: qty 1
  Filled 2023-08-01 (×3): qty 2
  Filled 2023-08-01: qty 1
  Filled 2023-08-01 (×2): qty 2

## 2023-08-01 NOTE — Progress Notes (Signed)
NAME:  William Jones, MRN:  [id number], DOB:  21-Jan-1951, LOS: 20 ADMISSION DATE:  07/16/2023, CONSULTATION DATE:  10/8 REFERRING MD:  Dr. Maryfrances Bunnell, CHIEF COMPLAINT:  hypotension, lactic acidosis   History of Present Illness:  72 year old male with PMH as below, which is significant for prostate cancer, substance abuse, GI bleeding, HTN, and HLD. He takes Xtandi for metastatic prostate Ca. Recently admitted for weakness and was found to have diffuse hepatic metastatic disease and metastasis to the adrenal glands as well. 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 Primary vs mets uncertain. He was discharged and followed up with oncology 10/3 with plans to start carboplatin and etoposide 10/8, but unfortunately he once again became weak and presented to [facility] ED 10/6. He was concerned he would be unable to get the care he needed at home and would miss appointments due to feeling weak and was hopeful to be admitted to the hospital. He was admitted to the hospital for generalized weakness and possible UTI. He was seen by oncology inpatient and was scheduled to start inpatient chemotherapy, however, in the AM hours of 10/8 he became tachycardic and labs indicated worsening acidosis. Lactic acid was check and was found to be greater than 9. The patient was transferred to ICU for closer monitoring and PCCM was consulted. The patient was treated with IVF. BP and LA improved. PCCM signed off.   Subsequent course has been complicated by Klebsiella UTI, anemia, and hypoxia. Anemia was evaluated by GI with upper and lower scopes. Lower scope found hemorrhoids but no clear evidence of bleeding. Hypoxia has been progressive. CXR was concerning for PNA vs edema and he has been treated with CAP antibiotics as well as diuresis, but hypoxia continued to worsen. PCCM called back 10/23 for hypoxia. DNR/DNI. Daughter updated.   Pertinent  Medical History   has a past medical  history of Angiodysplasia of colon with hemorrhage (10/14/2020), Duodenitis, Essential hypertension (07/16/2015), Fatigue (11/06/2020), Glaucoma, H/O: substance abuse (HCC), Hemorrhage of rectum and anus (10/14/2020), History of frostbite, History of prostate cancer (03/20/2014), Hyperlipidemia (11/20/2019), Iron deficiency anemia, Malignant tumor of prostate (HCC) (10/14/2020), Peripheral neuropathy, S/P radiation therapy (09/18/14-11/15/14), and Thrombocytopenia (HCC).   Significant Hospital Events: Including procedures, antibiotic start and stop dates in addition to other pertinent events   10/6 admit for generalized weakness 10/8 tx to ICU for LA 9  improved with volume 10/11 EGD erosive gastropathy with no bleeding stigmata 10/18 colonoscopy with diverticulosis and external hemorrhoids, no bleeding.  10/23 PCCM consult for hypoxia CT no obvious PE. Diffuse airspace opacities concerning for edema or infection.  10/24 Breathing a little better this morning, on 12L [place] and 15L nRB. BiPAP intermittently overnight.  10/25 Unable to tolerate BIPAP overnight, remains on [place] and NRB  Interim History / Subjective:   Feeling about the same today. No better no worse. Seems to be in good spirits.   Objective   Blood pressure (!) 143/78, pulse (!) 113, temperature 98.6 F (37 C), temperature source Axillary, resp. rate 20, height 5\' 9"  (1.753 m), weight 69.5 kg, SpO2 93%.    FiO2 (%):  [80 %-100 %] 95 %   Intake/Output Summary (Last 24 hours) at 08/01/2023 1418 Last data filed at 08/01/2023 1054 Gross per 24 hour  Intake 1432.73 ml  Output 1200 ml  Net 232.73 ml   Filed Weights   07/30/23 0243 07/31/23 0450 08/01/23 0256  Weight: 62.1 kg 64.3 kg 69.5 kg  Examination:  Thin elderly appearing male in NAD /AT, PERRL no JVD  RRR, no MRG Tachypnea, coarse crackles throughout posterior lung fields.  Awake, alert, oriented Skin grossly intact.   Resolved Hospital Problem list   UTI -  klebsiella oxytoca Lactic acidosis  Assessment & Plan:   Acute respiratory failure with hypoxia: Most consistent with pulmonary edema, but not really improving with diuresis. CTA negative for PE. Diffuse opacities. He has failed broad spectrum ABX, steroids, and diuresis. Lymphangitic spread? PJP? Can trial high dose Bactrim, but otherwise not much else to offer.   P: Continue supplemental oxygen wean for sat goal > 92% BiPAP at bedtime and PRN if he can tolerate Heated high flow otherwise.  Continue to diureses as able  Continue zosyn  Will discuss empiric Bactrim (PJP?) with family CRP, ESR, PCT pending  Malignant neuroendocrine carcinoma with metastatics to the liver and adrenals    Prostate cancer (adeno) with bone mets P: Management per oncology - Palliation recommended Trend LFTs  Supportive care   Anemia  -s/p EGD and colonoscopy with no evidence of bleeding. Erosive gastropathy and external hemorrhoids  Severe thrombocytopenia  Moderate protein calorie malnutrition  Other issues     As of 10/28 - full medical care but DNR/DNI Discussed with patient and family 12/28. They want to exhaust all options including transfer to tertiary care facility. I think there is nothing the be added by another facility and risk of transfer is quite high and would likely require intubation. He would be almost impossible to liberate from the ventilator as it stands currently. They seem to understand. Also addressed goals of care very briefly as they have been inquired about this often. I simply stated it would be reasonable to pursue comfort care whenever they so choose.     Best Practice (right click and "Reselect all SmartList Selections" daily)   Per primary   Critical care time:     SIGNATURE    Joneen Roach, AGACNP-BC [facility]  See Amion for personal pager PCCM on call pager [phone] until 7pm. Please call Elink 7p-7a.  [phone]  08/01/2023 3:07 PM

## 2023-08-01 NOTE — Progress Notes (Signed)
[facility]                                                                              [place], is a 72 y.o. male, DOB - Jan 19, 1951, William Jones Admit date - 07/13/2023    Outpatient Primary MD for the patient is Morene Crocker, MD  LOS - 20  days  Chief Complaint  Patient presents with   Weakness       Brief summary   72 year old M with HTN, HLD, and prostate cancer, recently admitted for generalized weakness, found to have new metastatic neuroendocrine tumor to liver and adrenals.   Was discharged home with plans for outpatient chemo start, but became profoundly weak, returned to the hospital   In the ER, mild transaminitis due to tumor, mild hypokalemia, WBC normal, no fever.  UA with pyuria. 10/6: Started on CTX, admitted for weakness, Xtandi held 10/7:  Oncology consulted, planned for inpatient chemo 10/20: Acute hypoxic respiratory failure with increasing O2 requirements, transfer to stepdown, placed on Lasix, albumin, BiPAP 10/23: Escalating O2 needs, on 15 L O2 HFNC and BiPAP, PCCM consulted 10/25: On BiPAP overnight and NRB daily, platelets 11K.  Oncology, palliative medicine following  Assessment & Plan    Principal Problem: Septic shock, lactic acidosis, UTI -Urine culture showed Klebsiella oxytoca.  Blood culture has been negative.   -CT scan abdomen pelvis 10/8 that showed multifocal pneumonia, innumerable liver masses consistent with metastatic disease, multiple adrenal masses and ?Pancreatitis  Active Problems:   Acute respiratory failure with hypoxia (HCC) -At baseline, not on any O2 at home -On 10/20, patient was transferred to SDU due to markedly worsened respiratory status and increased O2 requirements. Chest x-ray on 10/20 showed pulmonary edema, atypical infection -CTA chest showed no PE, diffuse airspace opacities noted throughout both lungs consistent with pneumonia or possibly edema.  Hepatic and adrenal  metastatic lesions noted, T10 metastatic lesion. -Respiratory status continues to remain tenuous, was placed on BiPAP yesterday as he was still hypoxic with NRB and 15L HFNC, visibly uncomfortable -Currently on Lasix 60 mg IV twice daily, IV Solu-Medrol tapered to 60 mg daily, IV Zosyn (changed on 10/25)     Malignant neuroendocrine carcinoma metastatic to liver and adrenals (HCC),  T10 vertebra -Recent diagnosis, biopsy 9/30 had shown high-grade neuroendocrine tumor, favor small cell carcinoma, metastatic  -Oncology had recommended palliative/hospice on 10/23, not a candidate for chemotherapy at this point -Palliative medicine also following for GOC -Pain control  Acute severe thrombocytopenia -Patient's platelets noted to be trending down, 111 on 10/21-> 35K on 10/23-> 20K 10/24->11k  today -Patient was on Lovenox which was discontinued on 10/23 -Unclear if HIT versus due to acute illness, HIT.  HIT ab's 0.086, SRA pending -Transfused 1 unit platelets on 10/25, 10/26,10/27 with no improvement, platelets 9K today, transfuse again -Awaiting oncology recommendations  Normocytic anemia: -Patient underwent EGD on 07-17-23 -Colonoscopy 10/18 with findings of external hemorrhoids. Per GI, if bleeding occurs, ice pack can help and if bleeding persists, surgical eval may be required -H/H stable    Essential hypertension -Continue metoprolol 5 mg every 6 hours   Acute Transaminitis -Likely due  to shock liver on presentation    Prostate cancer metastatic to bone [facility]) -[name] is on hold for now while in hospital  Hypokalemia, hypophosphatemia, hypomagnesemia: -pharmacy assisting with electrolyte replacement    Hypocalcemia: -likely due to chronic illness   -improving   Goals of care:  Patient seen and examined this morning and appears visibly uncomfortable, short of breath on BiPAP, was started on BiPAP yesterday as was hypoxic and struggling on NRB, 15 L HFNC.  Daughter at the  bedside.  Discussed with the patient's daughter about clinical worsening, despite maximal management, patient has had no significant improvement in the last few days, nearing end-of-life.  I discussed with difference between aggressive medical intervention path and comfort care path.  Patient's daughter stated that they talked about it and patient wants to continue everything and he would like to transfer to [facility].  I reiterated that patient is not a candidate for chemotherapy given his clinical status and oncology recommendations. -Requested oncology, pulmonology and palliative medicine to reevaluate for recommendations.  If deemed that patient will benefit from transfer to [facility], I can make that transfer call.  Patient's daughter is agreeable with this plan.    Moderate protein calorie malnutrition Nutrition Problem: Moderate Malnutrition Etiology: chronic illness, cancer and cancer related treatments Signs/Symptoms: mild fat depletion, moderate muscle depletion, percent weight loss Interventions: Ensure Enlive (each supplement provides 350kcal and 20 grams of protein), Education  Estimated body mass index is 22.63 kg/m as calculated from the following:   Height as of this encounter: 5\' 9"  (1.753 m).   Weight as of this encounter: 69.5 kg.  Code Status: full code DVT Prophylaxis:  Place and maintain sequential compression device Start: 07/14/23 1101   Level of Care: Level of care: Stepdown Family Communication: Updated patient's daughter at the bedside today  Disposition Plan:      Remains inpatient appropriate:      Procedures:    Consultants:   Pulm CCM Oncology Palliative medicine  Antimicrobials:   Anti-infectives (From admission, onward)    Start     Dose/Rate Route Frequency Ordered Stop   07/29/23 1615  piperacillin-tazobactam (ZOSYN) IVPB 3.375 g        3.375 g 12.5 mL/hr over 240 Minutes Intravenous Every 8 hours 07/29/23 1525     07/29/23  1200  vancomycin (VANCOREADY) IVPB 750 mg/150 mL  Status:  Discontinued        750 mg 150 mL/hr over 60 Minutes Intravenous Every 12 hours 07/28/23 1309 07/29/23 1518   07/28/23 1400  ceFEPIme (MAXIPIME) 2 g in sodium chloride 0.9 % 100 mL IVPB  Status:  Discontinued        2 g 200 mL/hr over 30 Minutes Intravenous Every 8 hours 07/28/23 1302 07/29/23 1518   07/27/23 2100  ceFEPIme (MAXIPIME) 2 g in sodium chloride 0.9 % 100 mL IVPB        2 g 200 mL/hr over 30 Minutes Intravenous Every 8 hours 07/27/23 1259 07/28/23 0618   07/27/23 1100  vancomycin (VANCOREADY) IVPB 1750 mg/350 mL  Status:  Discontinued        1,750 mg 175 mL/hr over 120 Minutes Intravenous Every 24 hours 07/27/23 0950 07/28/23 1309   07/27/23 1030  ceFEPIme (MAXIPIME) 2 g in sodium chloride 0.9 % 100 mL IVPB  Status:  Discontinued        2 g 200 mL/hr over 30 Minutes Intravenous Every 8 hours 07/27/23 0937 07/27/23 1259   07/27/23 1030  vancomycin (  VANCOREADY) IVPB 1250 mg/250 mL  Status:  Discontinued        1,250 mg 166.7 mL/hr over 90 Minutes Intravenous  Once 07/27/23 0937 07/27/23 0950   07/24/23 1700  cefTRIAXone (ROCEPHIN) 2 g in sodium chloride 0.9 % 100 mL IVPB  Status:  Discontinued        2 g 200 mL/hr over 30 Minutes Intravenous Daily 07/24/23 1630 07/25/23 1453   07/24/23 1700  azithromycin (ZITHROMAX) 500 mg in sodium chloride 0.9 % 250 mL IVPB  Status:  Discontinued        500 mg 250 mL/hr over 60 Minutes Intravenous Daily 07/24/23 1630 07/25/23 1453   07/17/23 1200  Ampicillin-Sulbactam (UNASYN) 3 g in sodium chloride 0.9 % 100 mL IVPB        3 g 200 mL/hr over 30 Minutes Intravenous Every 6 hours 07/17/23 1148 07/18/23 0049   07/14/23 1200  Ampicillin-Sulbactam (UNASYN) 3 g in sodium chloride 0.9 % 100 mL IVPB  Status:  Discontinued        3 g 200 mL/hr over 30 Minutes Intravenous Every 6 hours 07/14/23 1028 07/17/23 1148   07/13/23 0300  vancomycin (VANCOREADY) IVPB 750 mg/150 mL  Status:   Discontinued        750 mg 150 mL/hr over 60 Minutes Intravenous Every 12 hours 07/12/23 1403 07/14/23 0830   07/12/23 1430  vancomycin (VANCOREADY) IVPB 1500 mg/300 mL        1,500 mg 150 mL/hr over 120 Minutes Intravenous  Once 07/12/23 1402 07/12/23 1826   07/12/23 1200  meropenem (MERREM) 1 g in sodium chloride 0.9 % 100 mL IVPB  Status:  Discontinued        1 g 200 mL/hr over 30 Minutes Intravenous Every 8 hours 07/12/23 1102 07/14/23 1028   07/11/23 1800  cefTRIAXone (ROCEPHIN) 1 g in sodium chloride 0.9 % 100 mL IVPB  Status:  Discontinued        1 g 200 mL/hr over 30 Minutes Intravenous Every 24 hours 08/02/2023 1911 07/12/23 1102   07/24/2023 1800  cefTRIAXone (ROCEPHIN) 1 g in sodium chloride 0.9 % 100 mL IVPB        1 g 200 mL/hr over 30 Minutes Intravenous  Once 07/23/2023 1745 07/26/2023 2132          Medications  Chlorhexidine Gluconate Cloth  6 each Topical Q2200   docusate sodium  100 mg Oral BID   dorzolamide  1 drop Right Eye BID   feeding supplement  1 Container Oral TID BM   furosemide  60 mg Intravenous BID   Gerhardt's butt cream   Topical BID   insulin aspart  0-15 Units Subcutaneous TID WC   insulin aspart  0-5 Units Subcutaneous QHS   insulin aspart  4 Units Subcutaneous TID WC   insulin glargine-yfgn  11 Units Subcutaneous Daily   methylPREDNISolone (SOLU-MEDROL) injection  60 mg Intravenous Q24H   metoprolol tartrate  5 mg Intravenous Q6H   mouth rinse  15 mL Mouth Rinse 4 times per day   pantoprazole (PROTONIX) IV  40 mg Intravenous Q12H   sodium chloride flush  3 mL Intravenous Q12H   timolol  1 drop Right Eye BID      Subjective:   William Jones was seen and examined today.  Noted on BiPAP, visibly uncomfortable, respiratory status remains tenuous.  Per RN, was getting hypoxic and struggling yesterday on nonrebreather 15 L O2 HFNC.  Daughter at the bedside.  Vitals:  08/01/23 0600 08/01/23 0800 08/01/23 0943 08/01/23 0948  BP: 123/77      Pulse:      Resp: 18     Temp:  98.1 F (36.7 C)    TempSrc:  Axillary    SpO2:   96% 94%  Weight:      Height:        Intake/Output Summary (Last 24 hours) at 08/01/2023 0956 Last data filed at 08/01/2023 0538 Gross per 24 hour  Intake 1843.94 ml  Output 1200 ml  Net 643.94 ml     Wt Readings from Last 3 Encounters:  08/01/23 69.5 kg  07/07/23 68.3 kg  07/05/23 71.1 kg   Physical Exam General: Alert and awake, on BiPAP, appears uncomfortable Cardiovascular: S1 S2 clear, RRR.  Respiratory: Diminished breath sounds bilaterally Gastrointestinal: Soft, nontender, nondistended, NBS Ext: no pedal edema bilaterally Neuro: no new deficits Psych:   Data Reviewed:  I have personally reviewed following labs    CBC Lab Results  Component Value Date   WBC 6.8 08/01/2023   RBC 2.68 (L) 08/01/2023   HGB 8.1 (L) 08/01/2023   HCT 25.3 (L) 08/01/2023   MCV 94.4 08/01/2023   MCH 30.2 08/01/2023   PLT 9 (LL) 08/01/2023   MCHC 32.0 08/01/2023   RDW 16.8 (H) 08/01/2023   LYMPHSABS 0.4 (L) 07/30/2023   MONOABS 0.2 07/30/2023   EOSABS 0.0 07/30/2023   BASOSABS 0.0 07/30/2023     Last metabolic panel Lab Results  Component Value Date   NA 139 08/01/2023   K 4.2 08/01/2023   CL 99 08/01/2023   CO2 32 08/01/2023   BUN 28 (H) 08/01/2023   CREATININE 0.56 (L) 08/01/2023   GLUCOSE 74 08/01/2023   GFRNONAA >60 08/01/2023   GFRAA 103 12/18/2019   CALCIUM 7.5 (L) 08/01/2023   PHOS 3.0 08/01/2023   PROT 5.1 (L) 07/26/2023   ALBUMIN 2.1 (L) 07/26/2023   LABGLOB 2.4 04/10/2019   AGRATIO 1.6 04/10/2019   BILITOT 0.9 07/26/2023   ALKPHOS 263 (H) 07/26/2023   AST 39 07/26/2023   ALT 64 (H) 07/26/2023   ANIONGAP 8 08/01/2023    CBG (last 3)  Recent Labs    08/01/23 0531 08/01/23 0751 08/01/23 0810  GLUCAP 72 67* 81      Coagulation Profile: No results for input(s): "INR", "PROTIME" in the last 168 hours.   Radiology Studies: I have personally reviewed the  imaging studies  No results found.     Thad Ranger M.D. [facility] 08/01/2023, 9:56 AM  Available via Epic secure chat 7am-7pm After 7 pm, please refer to night coverage provider listed on amion.

## 2023-08-01 NOTE — TOC Progression Note (Signed)
Transition of Care [facility]) - Progression Note    Patient Details  Name: William Jones MRN: [id number] Date of Birth: Oct 26, 1950  [facility]) CM/SW Contact  Darleene Cleaver, [place] Phone Number: 08/01/2023, 5:24 PM  Clinical Narrative:     CSW spoke to attending physician regarding if LTACH should be pursued.  Per attending physician, she said [facility] should wait to talk with [facility] again.  CSW updated Latonya at [employer].  TOC to continue to follow patient throughout discharge planning.  Expected Discharge Plan:  (unknown (potential hospice)) Barriers to Discharge: Continued Medical Work up  Expected Discharge Plan and Services In-house Referral: Hospice / Palliative Care, Chaplain Discharge Planning Services: CM Consult Post Acute Care Choice:  (undetermined) Living arrangements for the past 2 months: Apartment                 DME Arranged: N/A DME Agency: NA       HH Arranged: NA HH Agency: NA         Social Determinants of Health (SDOH) Interventions SDOH Screenings   Food Insecurity: No Food Insecurity (06/30/2023)  Housing: Patient Declined (06/30/2023)  Transportation Needs: No Transportation Needs (06/30/2023)  Utilities: Not At Risk (06/30/2023)  Alcohol Screen: Low Risk  (10/20/2022)  Depression (PHQ2-9): Low Risk  (06/24/2023)  Financial Resource Strain: Low Risk  (06/25/2020)  Physical Activity: Sufficiently Active (06/25/2020)  Social Connections: Moderately Integrated (06/25/2020)  Stress: No Stress Concern Present (10/20/2022)  Tobacco Use: Medium Risk (07/22/2023)    Readmission Risk Interventions    07/29/2023    5:36 PM 07/13/2023   11:19 AM 07/13/2023   11:18 AM  Readmission Risk Prevention Plan  Transportation Screening Complete  Complete  Medication Review Oceanographer) Complete  Complete  PCP or Specialist appointment within 3-5 days of discharge Complete  Complete  HRI or Home Care Consult Complete  Complete  SW Recovery  Care/Counseling Consult Complete  Complete  Palliative Care Screening Complete Not Applicable Complete  Skilled Nursing Facility Complete  Complete

## 2023-08-01 NOTE — Progress Notes (Signed)
Replaced HFNC water bottle. Pressure test completed and passed.

## 2023-08-01 NOTE — Plan of Care (Signed)

## 2023-08-01 NOTE — Inpatient Diabetes Management (Signed)
Inpatient Diabetes Program Recommendations  AACE/ADA: New Consensus Statement on Inpatient Glycemic Control (2015)  Target Ranges:  Prepandial:   less than 140 mg/dL      Peak postprandial:   less than 180 mg/dL (1-2 hours)      Critically ill patients:  140 - 180 mg/dL   Lab Results  Component Value Date   GLUCAP 81 08/01/2023   HGBA1C 6.5 (A) 06/24/2023    Review of Glycemic Control  Diabetes history: DM2 Outpatient Diabetes medications: Jardiance 10 mg every day,  Current orders for Inpatient glycemic control: Semgle 11 units every day, Novolog 0-15 TID and 0-5 HS + 4 units TID  Has consistently had hypoglycemia for the past several days. Want to avoid hypos, allow some permissive hyperglycemia to prevent lows.  Inpatient Diabetes Program Recommendations:    Consider decreasing Semglee to 8-9 units every day  Consider discontinuing Novolog 4 units TID.  Continue to follow.  Thank you. Ailene Ards, RD, LDN, CDCES Inpatient Diabetes Coordinator [phone]

## 2023-08-01 NOTE — Progress Notes (Signed)
Transportation Needs (06/30/2023)   PRAPARE - Administrator, Civil Service (Medical): No    Lack of Transportation (Non-Medical): No  Physical Activity: Sufficiently Active (06/25/2020)   Exercise Vital Sign    Days of Exercise per Week: 5 days    Minutes of Exercise per Session: 150+ min  Stress: No Stress Concern Present (10/20/2022)   [employer] of Occupational Health - Occupational Stress Questionnaire    Feeling of Stress : Not at all  Social Connections: Moderately Integrated (06/25/2020)   Social Connection and Isolation Panel [NHANES]    Frequency of Communication with Friends and Family: More than three times a week    Frequency of Social Gatherings with Friends and Family: Twice a week    Attends Religious Services: More than 4 times per year    Active Member of [employer] or Organizations: No    Attends Banker Meetings: Never    Marital Status: Living with partner  Intimate Partner Violence: Not At Risk (06/30/2023)   Humiliation, Afraid, Rape, and Kick questionnaire    Fear of Current or Ex-Partner: No    Emotionally Abused: No    Physically Abused: No    Sexually Abused: No    BP 136/67   Pulse (!) 117   Temp 98.8 F (37.1 C) (Axillary)   Resp (!) 27   Ht 5'  9" (1.753 m)   Wt 153 lb 3.5 oz (69.5 kg)   SpO2 93%   BMI 22.63 kg/m   He appears comfortable on high oxygen, able to talk and was eating some food.  Lab Results  Component Value Date   WBC 6.8 08/01/2023   HGB 8.1 (L) 08/01/2023   HCT 25.3 (L) 08/01/2023   PLT 9 (LL) 08/01/2023   GLUCOSE 74 08/01/2023   CHOL 187 03/19/2022   TRIG 56 03/19/2022   HDL 52 03/19/2022   LDLCALC 124 (H) 03/19/2022   ALT 64 (H) 07/26/2023   AST 39 07/26/2023   NA 139 08/01/2023   K 4.2 08/01/2023   CL 99 08/01/2023   CREATININE 0.56 (L) 08/01/2023   BUN 28 (H) 08/01/2023   CO2 32 08/01/2023    ECHOCARDIOGRAM COMPLETE  Result Date: 07/28/2023    ECHOCARDIOGRAM LIMITED REPORT   Patient Name:   William Jones Date of Exam: 07/28/2023 Medical Rec #:  [id number]             Height:       69.0 in Accession #:    [id number]            Weight:       137.1 lb Date of Birth:  1950-12-16              BSA:          1.760 m Patient Age:    72 years              BP:           126/71 mmHg Patient Gender: M                     HR:           109 bpm. Exam Location:  Inpatient Procedure: 2D Echo Indications:    Dyspnea R06.22  History:        Patient has prior history of Echocardiogram examinations, most                 recent 06/30/2023.  vital signs were  monitored continuously by radiology nursing throughout the procedure under my direct supervision. COMPLICATIONS: None immediate. PROCEDURE: Informed written consent was obtained from the patient and/or patient's representative after a discussion of the risks, benefits and alternatives to treatment. The patient understands and consents the procedure. A timeout was performed prior to the initiation of the procedure. Ultrasound scanning was performed of the right upper abdominal quadrant demonstrates multifocal liver masses. The LEFT hepatic lobe mass was selected for biopsy and the procedure was planned. The right upper abdominal quadrant was prepped and draped in the usual sterile fashion. The overlying soft tissues were anesthetized with 1% lidocaine with epinephrine. A 17 gauge, 6.8 cm co-axial needle was advanced into a peripheral aspect of the lesion. This was followed by 4 core biopsies with an 18 gauge core device under direct ultrasound guidance. The coaxial needle tract was embolized with a small amount of Gel-Foam slurry and superficial hemostasis was obtained with manual compression. Post procedural scanning was negative for definitive area of hemorrhage or additional complication. A dressing was placed. The patient tolerated the procedure well without immediate post procedural complication. IMPRESSION: Successful ultrasound guided core needle biopsy of liver mass. Roanna Banning, MD [facility] Radiology Electronically Signed   By: Roanna Banning M.D.   On: 07/04/2023 11:16    Pathology:small cell carcinoma.  ECHOCARDIOGRAM COMPLETE  Result Date: 07/28/2023    ECHOCARDIOGRAM LIMITED REPORT   Patient Name:   William Jones Date of Exam: 07/28/2023 Medical Rec #:  [id number]             Height:       69.0 in Accession #:    [id number]            Weight:       137.1 lb Date of Birth:  23-Jun-1951              BSA:          1.760 m Patient Age:    72 years               BP:           126/71 mmHg Patient Gender: M                     HR:           109 bpm. Exam Location:  Inpatient Procedure: 2D Echo Indications:    Dyspnea R06.22  History:        Patient has prior history of Echocardiogram examinations, most                 recent 06/30/2023. Risk Factors:Hypertension and Dyslipidemia.  Sonographer:    Harriette Bouillon RDCS Referring Phys: [phone] Clarene Critchley HOFFMAN IMPRESSIONS  1. Hyperdynamic LV with moderate concentric LVH and mid-cavity obliteration. Given significant LVH and aortic valve thickening consideration should be given to a possible infiltrative process (i.e. cardiac amyloid). . Left ventricular ejection fraction,  by estimation, is >75%. The left ventricle has hyperdynamic function. The left ventricle has no regional wall motion abnormalities. There is moderate concentric left ventricular hypertrophy. Left ventricular diastolic parameters are consistent with Grade I diastolic dysfunction (impaired relaxation).  2. Right ventricular systolic function is normal. The right ventricular size is normal.  3. The mitral valve is normal in structure. No evidence of mitral valve regurgitation. No evidence of mitral stenosis.  4. The aortic valve is tricuspid. There is mild calcification  Transportation Needs (06/30/2023)   PRAPARE - Administrator, Civil Service (Medical): No    Lack of Transportation (Non-Medical): No  Physical Activity: Sufficiently Active (06/25/2020)   Exercise Vital Sign    Days of Exercise per Week: 5 days    Minutes of Exercise per Session: 150+ min  Stress: No Stress Concern Present (10/20/2022)   [employer] of Occupational Health - Occupational Stress Questionnaire    Feeling of Stress : Not at all  Social Connections: Moderately Integrated (06/25/2020)   Social Connection and Isolation Panel [NHANES]    Frequency of Communication with Friends and Family: More than three times a week    Frequency of Social Gatherings with Friends and Family: Twice a week    Attends Religious Services: More than 4 times per year    Active Member of [employer] or Organizations: No    Attends Banker Meetings: Never    Marital Status: Living with partner  Intimate Partner Violence: Not At Risk (06/30/2023)   Humiliation, Afraid, Rape, and Kick questionnaire    Fear of Current or Ex-Partner: No    Emotionally Abused: No    Physically Abused: No    Sexually Abused: No    BP 136/67   Pulse (!) 117   Temp 98.8 F (37.1 C) (Axillary)   Resp (!) 27   Ht 5'  9" (1.753 m)   Wt 153 lb 3.5 oz (69.5 kg)   SpO2 93%   BMI 22.63 kg/m   He appears comfortable on high oxygen, able to talk and was eating some food.  Lab Results  Component Value Date   WBC 6.8 08/01/2023   HGB 8.1 (L) 08/01/2023   HCT 25.3 (L) 08/01/2023   PLT 9 (LL) 08/01/2023   GLUCOSE 74 08/01/2023   CHOL 187 03/19/2022   TRIG 56 03/19/2022   HDL 52 03/19/2022   LDLCALC 124 (H) 03/19/2022   ALT 64 (H) 07/26/2023   AST 39 07/26/2023   NA 139 08/01/2023   K 4.2 08/01/2023   CL 99 08/01/2023   CREATININE 0.56 (L) 08/01/2023   BUN 28 (H) 08/01/2023   CO2 32 08/01/2023    ECHOCARDIOGRAM COMPLETE  Result Date: 07/28/2023    ECHOCARDIOGRAM LIMITED REPORT   Patient Name:   William Jones Date of Exam: 07/28/2023 Medical Rec #:  [id number]             Height:       69.0 in Accession #:    [id number]            Weight:       137.1 lb Date of Birth:  1950-12-16              BSA:          1.760 m Patient Age:    72 years              BP:           126/71 mmHg Patient Gender: M                     HR:           109 bpm. Exam Location:  Inpatient Procedure: 2D Echo Indications:    Dyspnea R06.22  History:        Patient has prior history of Echocardiogram examinations, most                 recent 06/30/2023.  Transportation Needs (06/30/2023)   PRAPARE - Administrator, Civil Service (Medical): No    Lack of Transportation (Non-Medical): No  Physical Activity: Sufficiently Active (06/25/2020)   Exercise Vital Sign    Days of Exercise per Week: 5 days    Minutes of Exercise per Session: 150+ min  Stress: No Stress Concern Present (10/20/2022)   [employer] of Occupational Health - Occupational Stress Questionnaire    Feeling of Stress : Not at all  Social Connections: Moderately Integrated (06/25/2020)   Social Connection and Isolation Panel [NHANES]    Frequency of Communication with Friends and Family: More than three times a week    Frequency of Social Gatherings with Friends and Family: Twice a week    Attends Religious Services: More than 4 times per year    Active Member of [employer] or Organizations: No    Attends Banker Meetings: Never    Marital Status: Living with partner  Intimate Partner Violence: Not At Risk (06/30/2023)   Humiliation, Afraid, Rape, and Kick questionnaire    Fear of Current or Ex-Partner: No    Emotionally Abused: No    Physically Abused: No    Sexually Abused: No    BP 136/67   Pulse (!) 117   Temp 98.8 F (37.1 C) (Axillary)   Resp (!) 27   Ht 5'  9" (1.753 m)   Wt 153 lb 3.5 oz (69.5 kg)   SpO2 93%   BMI 22.63 kg/m   He appears comfortable on high oxygen, able to talk and was eating some food.  Lab Results  Component Value Date   WBC 6.8 08/01/2023   HGB 8.1 (L) 08/01/2023   HCT 25.3 (L) 08/01/2023   PLT 9 (LL) 08/01/2023   GLUCOSE 74 08/01/2023   CHOL 187 03/19/2022   TRIG 56 03/19/2022   HDL 52 03/19/2022   LDLCALC 124 (H) 03/19/2022   ALT 64 (H) 07/26/2023   AST 39 07/26/2023   NA 139 08/01/2023   K 4.2 08/01/2023   CL 99 08/01/2023   CREATININE 0.56 (L) 08/01/2023   BUN 28 (H) 08/01/2023   CO2 32 08/01/2023    ECHOCARDIOGRAM COMPLETE  Result Date: 07/28/2023    ECHOCARDIOGRAM LIMITED REPORT   Patient Name:   William Jones Date of Exam: 07/28/2023 Medical Rec #:  [id number]             Height:       69.0 in Accession #:    [id number]            Weight:       137.1 lb Date of Birth:  1950-12-16              BSA:          1.760 m Patient Age:    72 years              BP:           126/71 mmHg Patient Gender: M                     HR:           109 bpm. Exam Location:  Inpatient Procedure: 2D Echo Indications:    Dyspnea R06.22  History:        Patient has prior history of Echocardiogram examinations, most                 recent 06/30/2023.  Transportation Needs (06/30/2023)   PRAPARE - Administrator, Civil Service (Medical): No    Lack of Transportation (Non-Medical): No  Physical Activity: Sufficiently Active (06/25/2020)   Exercise Vital Sign    Days of Exercise per Week: 5 days    Minutes of Exercise per Session: 150+ min  Stress: No Stress Concern Present (10/20/2022)   [employer] of Occupational Health - Occupational Stress Questionnaire    Feeling of Stress : Not at all  Social Connections: Moderately Integrated (06/25/2020)   Social Connection and Isolation Panel [NHANES]    Frequency of Communication with Friends and Family: More than three times a week    Frequency of Social Gatherings with Friends and Family: Twice a week    Attends Religious Services: More than 4 times per year    Active Member of [employer] or Organizations: No    Attends Banker Meetings: Never    Marital Status: Living with partner  Intimate Partner Violence: Not At Risk (06/30/2023)   Humiliation, Afraid, Rape, and Kick questionnaire    Fear of Current or Ex-Partner: No    Emotionally Abused: No    Physically Abused: No    Sexually Abused: No    BP 136/67   Pulse (!) 117   Temp 98.8 F (37.1 C) (Axillary)   Resp (!) 27   Ht 5'  9" (1.753 m)   Wt 153 lb 3.5 oz (69.5 kg)   SpO2 93%   BMI 22.63 kg/m   He appears comfortable on high oxygen, able to talk and was eating some food.  Lab Results  Component Value Date   WBC 6.8 08/01/2023   HGB 8.1 (L) 08/01/2023   HCT 25.3 (L) 08/01/2023   PLT 9 (LL) 08/01/2023   GLUCOSE 74 08/01/2023   CHOL 187 03/19/2022   TRIG 56 03/19/2022   HDL 52 03/19/2022   LDLCALC 124 (H) 03/19/2022   ALT 64 (H) 07/26/2023   AST 39 07/26/2023   NA 139 08/01/2023   K 4.2 08/01/2023   CL 99 08/01/2023   CREATININE 0.56 (L) 08/01/2023   BUN 28 (H) 08/01/2023   CO2 32 08/01/2023    ECHOCARDIOGRAM COMPLETE  Result Date: 07/28/2023    ECHOCARDIOGRAM LIMITED REPORT   Patient Name:   William Jones Date of Exam: 07/28/2023 Medical Rec #:  [id number]             Height:       69.0 in Accession #:    [id number]            Weight:       137.1 lb Date of Birth:  1950-12-16              BSA:          1.760 m Patient Age:    72 years              BP:           126/71 mmHg Patient Gender: M                     HR:           109 bpm. Exam Location:  Inpatient Procedure: 2D Echo Indications:    Dyspnea R06.22  History:        Patient has prior history of Echocardiogram examinations, most                 recent 06/30/2023.  vital signs were  monitored continuously by radiology nursing throughout the procedure under my direct supervision. COMPLICATIONS: None immediate. PROCEDURE: Informed written consent was obtained from the patient and/or patient's representative after a discussion of the risks, benefits and alternatives to treatment. The patient understands and consents the procedure. A timeout was performed prior to the initiation of the procedure. Ultrasound scanning was performed of the right upper abdominal quadrant demonstrates multifocal liver masses. The LEFT hepatic lobe mass was selected for biopsy and the procedure was planned. The right upper abdominal quadrant was prepped and draped in the usual sterile fashion. The overlying soft tissues were anesthetized with 1% lidocaine with epinephrine. A 17 gauge, 6.8 cm co-axial needle was advanced into a peripheral aspect of the lesion. This was followed by 4 core biopsies with an 18 gauge core device under direct ultrasound guidance. The coaxial needle tract was embolized with a small amount of Gel-Foam slurry and superficial hemostasis was obtained with manual compression. Post procedural scanning was negative for definitive area of hemorrhage or additional complication. A dressing was placed. The patient tolerated the procedure well without immediate post procedural complication. IMPRESSION: Successful ultrasound guided core needle biopsy of liver mass. Roanna Banning, MD [facility] Radiology Electronically Signed   By: Roanna Banning M.D.   On: 07/04/2023 11:16    Pathology:small cell carcinoma.  ECHOCARDIOGRAM COMPLETE  Result Date: 07/28/2023    ECHOCARDIOGRAM LIMITED REPORT   Patient Name:   William Jones Date of Exam: 07/28/2023 Medical Rec #:  [id number]             Height:       69.0 in Accession #:    [id number]            Weight:       137.1 lb Date of Birth:  23-Jun-1951              BSA:          1.760 m Patient Age:    72 years               BP:           126/71 mmHg Patient Gender: M                     HR:           109 bpm. Exam Location:  Inpatient Procedure: 2D Echo Indications:    Dyspnea R06.22  History:        Patient has prior history of Echocardiogram examinations, most                 recent 06/30/2023. Risk Factors:Hypertension and Dyslipidemia.  Sonographer:    Harriette Bouillon RDCS Referring Phys: [phone] Clarene Critchley HOFFMAN IMPRESSIONS  1. Hyperdynamic LV with moderate concentric LVH and mid-cavity obliteration. Given significant LVH and aortic valve thickening consideration should be given to a possible infiltrative process (i.e. cardiac amyloid). . Left ventricular ejection fraction,  by estimation, is >75%. The left ventricle has hyperdynamic function. The left ventricle has no regional wall motion abnormalities. There is moderate concentric left ventricular hypertrophy. Left ventricular diastolic parameters are consistent with Grade I diastolic dysfunction (impaired relaxation).  2. Right ventricular systolic function is normal. The right ventricular size is normal.  3. The mitral valve is normal in structure. No evidence of mitral valve regurgitation. No evidence of mitral stenosis.  4. The aortic valve is tricuspid. There is mild calcification  Transportation Needs (06/30/2023)   PRAPARE - Administrator, Civil Service (Medical): No    Lack of Transportation (Non-Medical): No  Physical Activity: Sufficiently Active (06/25/2020)   Exercise Vital Sign    Days of Exercise per Week: 5 days    Minutes of Exercise per Session: 150+ min  Stress: No Stress Concern Present (10/20/2022)   [employer] of Occupational Health - Occupational Stress Questionnaire    Feeling of Stress : Not at all  Social Connections: Moderately Integrated (06/25/2020)   Social Connection and Isolation Panel [NHANES]    Frequency of Communication with Friends and Family: More than three times a week    Frequency of Social Gatherings with Friends and Family: Twice a week    Attends Religious Services: More than 4 times per year    Active Member of [employer] or Organizations: No    Attends Banker Meetings: Never    Marital Status: Living with partner  Intimate Partner Violence: Not At Risk (06/30/2023)   Humiliation, Afraid, Rape, and Kick questionnaire    Fear of Current or Ex-Partner: No    Emotionally Abused: No    Physically Abused: No    Sexually Abused: No    BP 136/67   Pulse (!) 117   Temp 98.8 F (37.1 C) (Axillary)   Resp (!) 27   Ht 5'  9" (1.753 m)   Wt 153 lb 3.5 oz (69.5 kg)   SpO2 93%   BMI 22.63 kg/m   He appears comfortable on high oxygen, able to talk and was eating some food.  Lab Results  Component Value Date   WBC 6.8 08/01/2023   HGB 8.1 (L) 08/01/2023   HCT 25.3 (L) 08/01/2023   PLT 9 (LL) 08/01/2023   GLUCOSE 74 08/01/2023   CHOL 187 03/19/2022   TRIG 56 03/19/2022   HDL 52 03/19/2022   LDLCALC 124 (H) 03/19/2022   ALT 64 (H) 07/26/2023   AST 39 07/26/2023   NA 139 08/01/2023   K 4.2 08/01/2023   CL 99 08/01/2023   CREATININE 0.56 (L) 08/01/2023   BUN 28 (H) 08/01/2023   CO2 32 08/01/2023    ECHOCARDIOGRAM COMPLETE  Result Date: 07/28/2023    ECHOCARDIOGRAM LIMITED REPORT   Patient Name:   William Jones Date of Exam: 07/28/2023 Medical Rec #:  [id number]             Height:       69.0 in Accession #:    [id number]            Weight:       137.1 lb Date of Birth:  1950-12-16              BSA:          1.760 m Patient Age:    72 years              BP:           126/71 mmHg Patient Gender: M                     HR:           109 bpm. Exam Location:  Inpatient Procedure: 2D Echo Indications:    Dyspnea R06.22  History:        Patient has prior history of Echocardiogram examinations, most                 recent 06/30/2023.  Transportation Needs (06/30/2023)   PRAPARE - Administrator, Civil Service (Medical): No    Lack of Transportation (Non-Medical): No  Physical Activity: Sufficiently Active (06/25/2020)   Exercise Vital Sign    Days of Exercise per Week: 5 days    Minutes of Exercise per Session: 150+ min  Stress: No Stress Concern Present (10/20/2022)   [employer] of Occupational Health - Occupational Stress Questionnaire    Feeling of Stress : Not at all  Social Connections: Moderately Integrated (06/25/2020)   Social Connection and Isolation Panel [NHANES]    Frequency of Communication with Friends and Family: More than three times a week    Frequency of Social Gatherings with Friends and Family: Twice a week    Attends Religious Services: More than 4 times per year    Active Member of [employer] or Organizations: No    Attends Banker Meetings: Never    Marital Status: Living with partner  Intimate Partner Violence: Not At Risk (06/30/2023)   Humiliation, Afraid, Rape, and Kick questionnaire    Fear of Current or Ex-Partner: No    Emotionally Abused: No    Physically Abused: No    Sexually Abused: No    BP 136/67   Pulse (!) 117   Temp 98.8 F (37.1 C) (Axillary)   Resp (!) 27   Ht 5'  9" (1.753 m)   Wt 153 lb 3.5 oz (69.5 kg)   SpO2 93%   BMI 22.63 kg/m   He appears comfortable on high oxygen, able to talk and was eating some food.  Lab Results  Component Value Date   WBC 6.8 08/01/2023   HGB 8.1 (L) 08/01/2023   HCT 25.3 (L) 08/01/2023   PLT 9 (LL) 08/01/2023   GLUCOSE 74 08/01/2023   CHOL 187 03/19/2022   TRIG 56 03/19/2022   HDL 52 03/19/2022   LDLCALC 124 (H) 03/19/2022   ALT 64 (H) 07/26/2023   AST 39 07/26/2023   NA 139 08/01/2023   K 4.2 08/01/2023   CL 99 08/01/2023   CREATININE 0.56 (L) 08/01/2023   BUN 28 (H) 08/01/2023   CO2 32 08/01/2023    ECHOCARDIOGRAM COMPLETE  Result Date: 07/28/2023    ECHOCARDIOGRAM LIMITED REPORT   Patient Name:   William Jones Date of Exam: 07/28/2023 Medical Rec #:  [id number]             Height:       69.0 in Accession #:    [id number]            Weight:       137.1 lb Date of Birth:  1950-12-16              BSA:          1.760 m Patient Age:    72 years              BP:           126/71 mmHg Patient Gender: M                     HR:           109 bpm. Exam Location:  Inpatient Procedure: 2D Echo Indications:    Dyspnea R06.22  History:        Patient has prior history of Echocardiogram examinations, most                 recent 06/30/2023.  vital signs were  monitored continuously by radiology nursing throughout the procedure under my direct supervision. COMPLICATIONS: None immediate. PROCEDURE: Informed written consent was obtained from the patient and/or patient's representative after a discussion of the risks, benefits and alternatives to treatment. The patient understands and consents the procedure. A timeout was performed prior to the initiation of the procedure. Ultrasound scanning was performed of the right upper abdominal quadrant demonstrates multifocal liver masses. The LEFT hepatic lobe mass was selected for biopsy and the procedure was planned. The right upper abdominal quadrant was prepped and draped in the usual sterile fashion. The overlying soft tissues were anesthetized with 1% lidocaine with epinephrine. A 17 gauge, 6.8 cm co-axial needle was advanced into a peripheral aspect of the lesion. This was followed by 4 core biopsies with an 18 gauge core device under direct ultrasound guidance. The coaxial needle tract was embolized with a small amount of Gel-Foam slurry and superficial hemostasis was obtained with manual compression. Post procedural scanning was negative for definitive area of hemorrhage or additional complication. A dressing was placed. The patient tolerated the procedure well without immediate post procedural complication. IMPRESSION: Successful ultrasound guided core needle biopsy of liver mass. Roanna Banning, MD [facility] Radiology Electronically Signed   By: Roanna Banning M.D.   On: 07/04/2023 11:16    Pathology:small cell carcinoma.  ECHOCARDIOGRAM COMPLETE  Result Date: 07/28/2023    ECHOCARDIOGRAM LIMITED REPORT   Patient Name:   William Jones Date of Exam: 07/28/2023 Medical Rec #:  [id number]             Height:       69.0 in Accession #:    [id number]            Weight:       137.1 lb Date of Birth:  23-Jun-1951              BSA:          1.760 m Patient Age:    72 years               BP:           126/71 mmHg Patient Gender: M                     HR:           109 bpm. Exam Location:  Inpatient Procedure: 2D Echo Indications:    Dyspnea R06.22  History:        Patient has prior history of Echocardiogram examinations, most                 recent 06/30/2023. Risk Factors:Hypertension and Dyslipidemia.  Sonographer:    Harriette Bouillon RDCS Referring Phys: [phone] Clarene Critchley HOFFMAN IMPRESSIONS  1. Hyperdynamic LV with moderate concentric LVH and mid-cavity obliteration. Given significant LVH and aortic valve thickening consideration should be given to a possible infiltrative process (i.e. cardiac amyloid). . Left ventricular ejection fraction,  by estimation, is >75%. The left ventricle has hyperdynamic function. The left ventricle has no regional wall motion abnormalities. There is moderate concentric left ventricular hypertrophy. Left ventricular diastolic parameters are consistent with Grade I diastolic dysfunction (impaired relaxation).  2. Right ventricular systolic function is normal. The right ventricular size is normal.  3. The mitral valve is normal in structure. No evidence of mitral valve regurgitation. No evidence of mitral stenosis.  4. The aortic valve is tricuspid. There is mild calcification  Transportation Needs (06/30/2023)   PRAPARE - Administrator, Civil Service (Medical): No    Lack of Transportation (Non-Medical): No  Physical Activity: Sufficiently Active (06/25/2020)   Exercise Vital Sign    Days of Exercise per Week: 5 days    Minutes of Exercise per Session: 150+ min  Stress: No Stress Concern Present (10/20/2022)   [employer] of Occupational Health - Occupational Stress Questionnaire    Feeling of Stress : Not at all  Social Connections: Moderately Integrated (06/25/2020)   Social Connection and Isolation Panel [NHANES]    Frequency of Communication with Friends and Family: More than three times a week    Frequency of Social Gatherings with Friends and Family: Twice a week    Attends Religious Services: More than 4 times per year    Active Member of [employer] or Organizations: No    Attends Banker Meetings: Never    Marital Status: Living with partner  Intimate Partner Violence: Not At Risk (06/30/2023)   Humiliation, Afraid, Rape, and Kick questionnaire    Fear of Current or Ex-Partner: No    Emotionally Abused: No    Physically Abused: No    Sexually Abused: No    BP 136/67   Pulse (!) 117   Temp 98.8 F (37.1 C) (Axillary)   Resp (!) 27   Ht 5'  9" (1.753 m)   Wt 153 lb 3.5 oz (69.5 kg)   SpO2 93%   BMI 22.63 kg/m   He appears comfortable on high oxygen, able to talk and was eating some food.  Lab Results  Component Value Date   WBC 6.8 08/01/2023   HGB 8.1 (L) 08/01/2023   HCT 25.3 (L) 08/01/2023   PLT 9 (LL) 08/01/2023   GLUCOSE 74 08/01/2023   CHOL 187 03/19/2022   TRIG 56 03/19/2022   HDL 52 03/19/2022   LDLCALC 124 (H) 03/19/2022   ALT 64 (H) 07/26/2023   AST 39 07/26/2023   NA 139 08/01/2023   K 4.2 08/01/2023   CL 99 08/01/2023   CREATININE 0.56 (L) 08/01/2023   BUN 28 (H) 08/01/2023   CO2 32 08/01/2023    ECHOCARDIOGRAM COMPLETE  Result Date: 07/28/2023    ECHOCARDIOGRAM LIMITED REPORT   Patient Name:   William Jones Date of Exam: 07/28/2023 Medical Rec #:  [id number]             Height:       69.0 in Accession #:    [id number]            Weight:       137.1 lb Date of Birth:  1950-12-16              BSA:          1.760 m Patient Age:    72 years              BP:           126/71 mmHg Patient Gender: M                     HR:           109 bpm. Exam Location:  Inpatient Procedure: 2D Echo Indications:    Dyspnea R06.22  History:        Patient has prior history of Echocardiogram examinations, most                 recent 06/30/2023.  vital signs were  monitored continuously by radiology nursing throughout the procedure under my direct supervision. COMPLICATIONS: None immediate. PROCEDURE: Informed written consent was obtained from the patient and/or patient's representative after a discussion of the risks, benefits and alternatives to treatment. The patient understands and consents the procedure. A timeout was performed prior to the initiation of the procedure. Ultrasound scanning was performed of the right upper abdominal quadrant demonstrates multifocal liver masses. The LEFT hepatic lobe mass was selected for biopsy and the procedure was planned. The right upper abdominal quadrant was prepped and draped in the usual sterile fashion. The overlying soft tissues were anesthetized with 1% lidocaine with epinephrine. A 17 gauge, 6.8 cm co-axial needle was advanced into a peripheral aspect of the lesion. This was followed by 4 core biopsies with an 18 gauge core device under direct ultrasound guidance. The coaxial needle tract was embolized with a small amount of Gel-Foam slurry and superficial hemostasis was obtained with manual compression. Post procedural scanning was negative for definitive area of hemorrhage or additional complication. A dressing was placed. The patient tolerated the procedure well without immediate post procedural complication. IMPRESSION: Successful ultrasound guided core needle biopsy of liver mass. Roanna Banning, MD [facility] Radiology Electronically Signed   By: Roanna Banning M.D.   On: 07/04/2023 11:16    Pathology:small cell carcinoma.  ECHOCARDIOGRAM COMPLETE  Result Date: 07/28/2023    ECHOCARDIOGRAM LIMITED REPORT   Patient Name:   William Jones Date of Exam: 07/28/2023 Medical Rec #:  [id number]             Height:       69.0 in Accession #:    [id number]            Weight:       137.1 lb Date of Birth:  23-Jun-1951              BSA:          1.760 m Patient Age:    72 years               BP:           126/71 mmHg Patient Gender: M                     HR:           109 bpm. Exam Location:  Inpatient Procedure: 2D Echo Indications:    Dyspnea R06.22  History:        Patient has prior history of Echocardiogram examinations, most                 recent 06/30/2023. Risk Factors:Hypertension and Dyslipidemia.  Sonographer:    Harriette Bouillon RDCS Referring Phys: [phone] Clarene Critchley HOFFMAN IMPRESSIONS  1. Hyperdynamic LV with moderate concentric LVH and mid-cavity obliteration. Given significant LVH and aortic valve thickening consideration should be given to a possible infiltrative process (i.e. cardiac amyloid). . Left ventricular ejection fraction,  by estimation, is >75%. The left ventricle has hyperdynamic function. The left ventricle has no regional wall motion abnormalities. There is moderate concentric left ventricular hypertrophy. Left ventricular diastolic parameters are consistent with Grade I diastolic dysfunction (impaired relaxation).  2. Right ventricular systolic function is normal. The right ventricular size is normal.  3. The mitral valve is normal in structure. No evidence of mitral valve regurgitation. No evidence of mitral stenosis.  4. The aortic valve is tricuspid. There is mild calcification  Transportation Needs (06/30/2023)   PRAPARE - Administrator, Civil Service (Medical): No    Lack of Transportation (Non-Medical): No  Physical Activity: Sufficiently Active (06/25/2020)   Exercise Vital Sign    Days of Exercise per Week: 5 days    Minutes of Exercise per Session: 150+ min  Stress: No Stress Concern Present (10/20/2022)   [employer] of Occupational Health - Occupational Stress Questionnaire    Feeling of Stress : Not at all  Social Connections: Moderately Integrated (06/25/2020)   Social Connection and Isolation Panel [NHANES]    Frequency of Communication with Friends and Family: More than three times a week    Frequency of Social Gatherings with Friends and Family: Twice a week    Attends Religious Services: More than 4 times per year    Active Member of [employer] or Organizations: No    Attends Banker Meetings: Never    Marital Status: Living with partner  Intimate Partner Violence: Not At Risk (06/30/2023)   Humiliation, Afraid, Rape, and Kick questionnaire    Fear of Current or Ex-Partner: No    Emotionally Abused: No    Physically Abused: No    Sexually Abused: No    BP 136/67   Pulse (!) 117   Temp 98.8 F (37.1 C) (Axillary)   Resp (!) 27   Ht 5'  9" (1.753 m)   Wt 153 lb 3.5 oz (69.5 kg)   SpO2 93%   BMI 22.63 kg/m   He appears comfortable on high oxygen, able to talk and was eating some food.  Lab Results  Component Value Date   WBC 6.8 08/01/2023   HGB 8.1 (L) 08/01/2023   HCT 25.3 (L) 08/01/2023   PLT 9 (LL) 08/01/2023   GLUCOSE 74 08/01/2023   CHOL 187 03/19/2022   TRIG 56 03/19/2022   HDL 52 03/19/2022   LDLCALC 124 (H) 03/19/2022   ALT 64 (H) 07/26/2023   AST 39 07/26/2023   NA 139 08/01/2023   K 4.2 08/01/2023   CL 99 08/01/2023   CREATININE 0.56 (L) 08/01/2023   BUN 28 (H) 08/01/2023   CO2 32 08/01/2023    ECHOCARDIOGRAM COMPLETE  Result Date: 07/28/2023    ECHOCARDIOGRAM LIMITED REPORT   Patient Name:   William Jones Date of Exam: 07/28/2023 Medical Rec #:  [id number]             Height:       69.0 in Accession #:    [id number]            Weight:       137.1 lb Date of Birth:  1950-12-16              BSA:          1.760 m Patient Age:    72 years              BP:           126/71 mmHg Patient Gender: M                     HR:           109 bpm. Exam Location:  Inpatient Procedure: 2D Echo Indications:    Dyspnea R06.22  History:        Patient has prior history of Echocardiogram examinations, most                 recent 06/30/2023.  vital signs were  monitored continuously by radiology nursing throughout the procedure under my direct supervision. COMPLICATIONS: None immediate. PROCEDURE: Informed written consent was obtained from the patient and/or patient's representative after a discussion of the risks, benefits and alternatives to treatment. The patient understands and consents the procedure. A timeout was performed prior to the initiation of the procedure. Ultrasound scanning was performed of the right upper abdominal quadrant demonstrates multifocal liver masses. The LEFT hepatic lobe mass was selected for biopsy and the procedure was planned. The right upper abdominal quadrant was prepped and draped in the usual sterile fashion. The overlying soft tissues were anesthetized with 1% lidocaine with epinephrine. A 17 gauge, 6.8 cm co-axial needle was advanced into a peripheral aspect of the lesion. This was followed by 4 core biopsies with an 18 gauge core device under direct ultrasound guidance. The coaxial needle tract was embolized with a small amount of Gel-Foam slurry and superficial hemostasis was obtained with manual compression. Post procedural scanning was negative for definitive area of hemorrhage or additional complication. A dressing was placed. The patient tolerated the procedure well without immediate post procedural complication. IMPRESSION: Successful ultrasound guided core needle biopsy of liver mass. Roanna Banning, MD [facility] Radiology Electronically Signed   By: Roanna Banning M.D.   On: 07/04/2023 11:16    Pathology:small cell carcinoma.  ECHOCARDIOGRAM COMPLETE  Result Date: 07/28/2023    ECHOCARDIOGRAM LIMITED REPORT   Patient Name:   William Jones Date of Exam: 07/28/2023 Medical Rec #:  [id number]             Height:       69.0 in Accession #:    [id number]            Weight:       137.1 lb Date of Birth:  23-Jun-1951              BSA:          1.760 m Patient Age:    72 years               BP:           126/71 mmHg Patient Gender: M                     HR:           109 bpm. Exam Location:  Inpatient Procedure: 2D Echo Indications:    Dyspnea R06.22  History:        Patient has prior history of Echocardiogram examinations, most                 recent 06/30/2023. Risk Factors:Hypertension and Dyslipidemia.  Sonographer:    Harriette Bouillon RDCS Referring Phys: [phone] Clarene Critchley HOFFMAN IMPRESSIONS  1. Hyperdynamic LV with moderate concentric LVH and mid-cavity obliteration. Given significant LVH and aortic valve thickening consideration should be given to a possible infiltrative process (i.e. cardiac amyloid). . Left ventricular ejection fraction,  by estimation, is >75%. The left ventricle has hyperdynamic function. The left ventricle has no regional wall motion abnormalities. There is moderate concentric left ventricular hypertrophy. Left ventricular diastolic parameters are consistent with Grade I diastolic dysfunction (impaired relaxation).  2. Right ventricular systolic function is normal. The right ventricular size is normal.  3. The mitral valve is normal in structure. No evidence of mitral valve regurgitation. No evidence of mitral stenosis.  4. The aortic valve is tricuspid. There is mild calcification  Transportation Needs (06/30/2023)   PRAPARE - Administrator, Civil Service (Medical): No    Lack of Transportation (Non-Medical): No  Physical Activity: Sufficiently Active (06/25/2020)   Exercise Vital Sign    Days of Exercise per Week: 5 days    Minutes of Exercise per Session: 150+ min  Stress: No Stress Concern Present (10/20/2022)   [employer] of Occupational Health - Occupational Stress Questionnaire    Feeling of Stress : Not at all  Social Connections: Moderately Integrated (06/25/2020)   Social Connection and Isolation Panel [NHANES]    Frequency of Communication with Friends and Family: More than three times a week    Frequency of Social Gatherings with Friends and Family: Twice a week    Attends Religious Services: More than 4 times per year    Active Member of [employer] or Organizations: No    Attends Banker Meetings: Never    Marital Status: Living with partner  Intimate Partner Violence: Not At Risk (06/30/2023)   Humiliation, Afraid, Rape, and Kick questionnaire    Fear of Current or Ex-Partner: No    Emotionally Abused: No    Physically Abused: No    Sexually Abused: No    BP 136/67   Pulse (!) 117   Temp 98.8 F (37.1 C) (Axillary)   Resp (!) 27   Ht 5'  9" (1.753 m)   Wt 153 lb 3.5 oz (69.5 kg)   SpO2 93%   BMI 22.63 kg/m   He appears comfortable on high oxygen, able to talk and was eating some food.  Lab Results  Component Value Date   WBC 6.8 08/01/2023   HGB 8.1 (L) 08/01/2023   HCT 25.3 (L) 08/01/2023   PLT 9 (LL) 08/01/2023   GLUCOSE 74 08/01/2023   CHOL 187 03/19/2022   TRIG 56 03/19/2022   HDL 52 03/19/2022   LDLCALC 124 (H) 03/19/2022   ALT 64 (H) 07/26/2023   AST 39 07/26/2023   NA 139 08/01/2023   K 4.2 08/01/2023   CL 99 08/01/2023   CREATININE 0.56 (L) 08/01/2023   BUN 28 (H) 08/01/2023   CO2 32 08/01/2023    ECHOCARDIOGRAM COMPLETE  Result Date: 07/28/2023    ECHOCARDIOGRAM LIMITED REPORT   Patient Name:   William Jones Date of Exam: 07/28/2023 Medical Rec #:  [id number]             Height:       69.0 in Accession #:    [id number]            Weight:       137.1 lb Date of Birth:  1950-12-16              BSA:          1.760 m Patient Age:    72 years              BP:           126/71 mmHg Patient Gender: M                     HR:           109 bpm. Exam Location:  Inpatient Procedure: 2D Echo Indications:    Dyspnea R06.22  History:        Patient has prior history of Echocardiogram examinations, most                 recent 06/30/2023.  vital signs were  monitored continuously by radiology nursing throughout the procedure under my direct supervision. COMPLICATIONS: None immediate. PROCEDURE: Informed written consent was obtained from the patient and/or patient's representative after a discussion of the risks, benefits and alternatives to treatment. The patient understands and consents the procedure. A timeout was performed prior to the initiation of the procedure. Ultrasound scanning was performed of the right upper abdominal quadrant demonstrates multifocal liver masses. The LEFT hepatic lobe mass was selected for biopsy and the procedure was planned. The right upper abdominal quadrant was prepped and draped in the usual sterile fashion. The overlying soft tissues were anesthetized with 1% lidocaine with epinephrine. A 17 gauge, 6.8 cm co-axial needle was advanced into a peripheral aspect of the lesion. This was followed by 4 core biopsies with an 18 gauge core device under direct ultrasound guidance. The coaxial needle tract was embolized with a small amount of Gel-Foam slurry and superficial hemostasis was obtained with manual compression. Post procedural scanning was negative for definitive area of hemorrhage or additional complication. A dressing was placed. The patient tolerated the procedure well without immediate post procedural complication. IMPRESSION: Successful ultrasound guided core needle biopsy of liver mass. Roanna Banning, MD [facility] Radiology Electronically Signed   By: Roanna Banning M.D.   On: 07/04/2023 11:16    Pathology:small cell carcinoma.  ECHOCARDIOGRAM COMPLETE  Result Date: 07/28/2023    ECHOCARDIOGRAM LIMITED REPORT   Patient Name:   William Jones Date of Exam: 07/28/2023 Medical Rec #:  [id number]             Height:       69.0 in Accession #:    [id number]            Weight:       137.1 lb Date of Birth:  23-Jun-1951              BSA:          1.760 m Patient Age:    72 years               BP:           126/71 mmHg Patient Gender: M                     HR:           109 bpm. Exam Location:  Inpatient Procedure: 2D Echo Indications:    Dyspnea R06.22  History:        Patient has prior history of Echocardiogram examinations, most                 recent 06/30/2023. Risk Factors:Hypertension and Dyslipidemia.  Sonographer:    Harriette Bouillon RDCS Referring Phys: [phone] Clarene Critchley HOFFMAN IMPRESSIONS  1. Hyperdynamic LV with moderate concentric LVH and mid-cavity obliteration. Given significant LVH and aortic valve thickening consideration should be given to a possible infiltrative process (i.e. cardiac amyloid). . Left ventricular ejection fraction,  by estimation, is >75%. The left ventricle has hyperdynamic function. The left ventricle has no regional wall motion abnormalities. There is moderate concentric left ventricular hypertrophy. Left ventricular diastolic parameters are consistent with Grade I diastolic dysfunction (impaired relaxation).  2. Right ventricular systolic function is normal. The right ventricular size is normal.  3. The mitral valve is normal in structure. No evidence of mitral valve regurgitation. No evidence of mitral stenosis.  4. The aortic valve is tricuspid. There is mild calcification

## 2023-08-01 NOTE — Progress Notes (Addendum)
Pharmacy Electrolyte Replacement  Recent Labs:  Recent Labs    08/01/23 0322  K 4.2  MG 2.0  PHOS 3.0  CREATININE 0.56*   Patient diuresing extensively on BID IV Lasix since 10/20 for fluid overload and increased WOB. with great difficulty keeping up with electrolytes. Asked by Dr. Rhona Leavens 10/22 for pharmacy to help with monitoring and electrolyte supplementation. - We are also contributing to fluid overload with all the electrolyte supplementation bags of fluid.  UOP 10/20: 4225 UOP 10/21: 2550 UOP:10/22: 3525 ml UOP 10/23: 2850 ml UOP 10/24: UOP 10/25: again UOP 10/26: 1900 ml UOP 10/27: 1200  Today, 08/01/23  - K 4.2: WNL - Mg 2.0 WNL - Phos 3.0, WNL - CoCa 9.0, WNL  Plan: Regular diet but unable to be off respiratory support to take pills  - no supplementation needed for today  - BMET, Mg, Phos daily - Ca Gluconate 2g IV daily scheduled given today  - Mag Sulfate 1g IV daily  scheduled given today  - Phos: none - Potassium: none  - remains on furosemide 60 mg IV q12h   - When IV Lasix stops or UOP declines, we will have to be vigilant to decrease/stop all these supplements.   Adalberto Cole, PharmD, BCPS 08/01/2023 12:00 PM

## 2023-08-01 NOTE — Progress Notes (Signed)
Chaplain engaged in an initial visit with Pratyush, his daughter, and son-in-law. Chaplain provided space for narrative life review from daughter as Tamarion was having a hard time breathing and trying to rest. Daughter shared that her dad is originally from [place]. She described her dad as being hardworking, upbeat, and strong. She has always seen her dad as someone willing to keep going and do what needs to be done. She voiced that though Shan had a limited education and got in trouble as a youth, he was able to work hard, start businesses, get married and start a family. Chaplain recognized that Oswaldo has been a pillar of strength for his dad and family. Daughter also stated that Roddie is a loving grandpa to her son. He has enjoyed every moment of having a grandson who is two years old.   Daughter also shared that Chrissie Noa battled with prostate cancer for years and never told her. Daughter voiced, "I think he did that because he didn't want me to worry." Daughter became emotional thinking about her dad. She recognizes that nothing he is experiencing right now is curable but there is a hope that exists to get Marshon back to a place of breathing effectively. Daughter noted that she has asked father about his desire to keep fighting. Jometh has voiced his desire to keep going.   Chaplain provided reflective listening, a supportive presence, and compassion. Chaplain recognizes that family is still taking in all of the sudden changes happening with Mayford Knife. Chaplain will follow-up tomorrow.     08/01/23 1300  Spiritual Encounters  Type of Visit Initial  Care provided to: Pt and family  Reason for visit Routine spiritual support  Spiritual Framework  Presenting Themes Community and relationships;Caregiving needs;Goals in life/care;Impactful experiences and emotions;Meaning/purpose/sources of inspiration  Community/Connection Family;Faith community  Interventions  Spiritual Care  Interventions Made Established relationship of care and support;Reflective listening;Compassionate presence;Narrative/life review;Bereavement/grief support  Intervention Outcomes  Outcomes Awareness of support;Connection to spiritual care

## 2023-08-01 NOTE — Progress Notes (Signed)
   08/01/23 1924  BiPAP/CPAP/SIPAP  BiPAP/CPAP/SIPAP Pt Type Adult  Reason BIPAP/CPAP not in use Other(comment) (Pt currently on [facility]HFNC resting comfortably)

## 2023-08-01 NOTE — Progress Notes (Signed)
Daily Progress Note   Patient Name: William Jones       Date: 08/01/2023 DOB: [birth year]  Age: 72 y.o. MRN#: [id number] Attending Physician: Cathren Harsh, MD Primary Care Physician: Morene Crocker, MD Admit Date: 07/31/2023 Length of Stay: 20 days  Reason for Consultation/Follow-up: Establishing goals of care  Subjective:   CC: Patient continues to have shortness of breath in setting of end of life. Following up regarding complex medical decision making.   Subjective:  Reviewed EMR for medical updates. When seeing patient this morning, multiple family members at bedside visiting so did not interrupt. Patient now on HFNC with heated O2 to hopefully help respiratory status, which continues to worsen, and allow oral intake.  Discussed care with RN for updates. Patient's medical status continues to deteriorate as he appears to be at end of life. Unable to tolerate oral intake due to work of breathing. Only thing helping with patient's heavy symptom burden at this time is IV dilaudid so noted would make this available more frequently. RN did ask daughter if wanted to engage with PMT provider today and she did not wish to so respected this.   Discussed care with IDT including PCCM, oncologist, hospitalist, and RN throughout the day.   Objective:   Vital Signs:  BP 123/77   Pulse 92   Temp 98.1 F (36.7 C) (Axillary)   Resp 18   Ht 5\' 9"  (1.753 m)   Wt 69.5 kg   SpO2 98%   BMI 22.63 kg/m   Physical Exam: General: increased work of breathing noted,  laying in bed, chronically ill appearing, frail, cachectic Cardiovascular: Tachycardia noted Respiratory: increased work of breathing on HFNC and non re breather, using accessory muscles extensively to help breathe   Imaging: I personally reviewed recent imaging.   Assessment & Plan:   Assessment: Patient is a 72yo male with a PMHx of HTN, HLD, prostate adenocarcinoma, and recent diagnosis of small cell carcinoma  either with primary liver diagnosis or is secondary who was admitted on 07/20/2023 for management of worsening weakness. During hospitalization patient has received management for septic shock secondary to UTI, acute respiratory failure, and electrolyte imbalances. Oncology and PCCM consulted. Palliative medicine team consulted to assist with complex medical decision making.   Recommendations/Plan: # Complex medical decision making/goals of care:     -Multiple care teams including PMT have discussed with patient and daughter that despite all aggressive medical interventions, his body is medically deteriorating, and he is at the end of life.  All care teams have recommended transition to comfort focused care. Patient has been reluctant to agree to full comfort transition because he daughter does not want him too, despite his worsening symptom burden. It has been discussed with daughter he is dying and concerns about symptom burden causing discomfort at the end of life. She wants to continue medical interventions including possible transfer to [facility]. Already informed her of concern that patient is not a candidate for any cancer directed therapies in his condition and with his tenuous respiratory status may not even survive a transfer so unlikely that patient would benefit from transfer.                 -Patient already gave permission to that his daughter should be called first with updates and then his friend/girlfriend, William Jones.                 -  Code Status: Limited: Do not attempt resuscitation (DNR) -DNR-LIMITED -Do  Not Intubate/DNI     # Symptom management                -Pain/dyspnea, in setting of small cell carcinoma   -Increase IV dilaudid to 1-2mg  q1hrs prn   # Psycho-social/Spiritual Support:  - Support System: daughter (primary contact/NOK), friend/girlfriend, patient's daughter's sister (not biologically related to patient though related to daughter via mother) -Already reached out to  chaplains about spiritual support  # Discharge Planning: To Be Determined  Thank you for allowing the palliative care team to participate in the care [facility].  Alvester Morin, DO Palliative Care Provider PMT # [phone]  If patient remains symptomatic despite maximum doses, please call PMT at [phone] between 0700 and 1900. Outside of these hours, please call attending, as PMT does not have night coverage.  *Please note that this is a verbal dictation therefore any spelling or grammatical errors are due to the "[employer]agon [employer] One" system interpretation.

## 2023-08-01 NOTE — Progress Notes (Signed)
Nutrition Follow-up  DOCUMENTATION CODES:   Non-severe (moderate) malnutrition in context of chronic illness  INTERVENTION:  - DYS 1 diet per Palliative Care as able. - Boost Breeze po TID, each supplement provides 250 kcal and 9 grams of protein  Will monitor for ongoing GOC discussions.   NUTRITION DIAGNOSIS:   Moderate Malnutrition related to chronic illness, cancer and cancer related treatments as evidenced by mild fat depletion, moderate muscle depletion, percent weight loss. *ongoing  GOAL:   Patient will meet greater than or equal to 90% of their needs *not being met  MONITOR:   PO intake, Supplement acceptance, Labs, Weight trends, I & O's  REASON FOR ASSESSMENT:   Consult Assessment of nutrition requirement/status  ASSESSMENT:   72 y.o. male with medical history significant of HTN, glaucoma, HLD, and prostate CA presenting with generalized weakness.  He was recently admitted from 9/25-10/1 with generalized weakness.  During that admission, Xtandi for metastatic prostate cancer was held since this can exacerbate weakness.  He was noted to have diffuse hepatic metastatic disease as well as B adrenal mets.  Liver biopsy showed SCC, unsure if primary vs. Metastatic.  He was seen by Dr. Al Pimple on 10/3 and is planning to start [place] etoposide with first dose on 10/8.  10/6: admitted 10/8: started chemotherapy 10/11: EGD: erosive gastropathy 10/20: Acute hypoxic respiratory failure with increasing O2 requirements, transferred to stepdown  Patient working with another provider at time of visit.   Per chart review, no meal intakes documented over the past 6 days. However, it is noted that over the past few days patient has been unable to be taken off nonrebreather without greatly desaturating. As a result, has not been able to eat/drink.  Palliative care consulted 10/23 and has been following patient. Comfort focused care has been recommended by multiple providers.  Palliative care saw patient today and it was noted that patient is at the end of life. Does not want to transition to comfort care due to his daughter not wanting him to. They are requesting transfer to [facility] however it is noted that patient is not a candidate for any cancer directed therapies in his current condition.  GOC discussions remain ongoing.   Admit weight: 150# Current weight: 153# I&O's: -3.7L  Medications reviewed and include: Colace  Labs reviewed:  HA1C 6.5 Blood Glucose 65-204 x24 hours   Diet Order:   Diet Order             DIET - DYS 1 Room service appropriate? Yes; Fluid consistency: Thin  Diet effective now                   EDUCATION NEEDS: Education needs have been addressed  Skin:  Skin Assessment: Reviewed RN Assessment  Last BM:  10/27 Height:  Ht Readings from Last 1 Encounters:  07/15/23 5\' 9"  (1.753 m)   Weight:  Wt Readings from Last 1 Encounters:  08/01/23 69.5 kg    BMI:  Body mass index is 22.63 kg/m.  Estimated Nutritional Needs:  Kcal:  2050-2250 Protein:  100-115g Fluid:  2.2L/day    Shelle Iron RD, LDN For contact information, refer to [facility].

## 2023-08-02 DIAGNOSIS — R5383 Other fatigue: Secondary | ICD-10-CM | POA: Diagnosis not present

## 2023-08-02 DIAGNOSIS — R531 Weakness: Secondary | ICD-10-CM | POA: Diagnosis not present

## 2023-08-02 DIAGNOSIS — N3000 Acute cystitis without hematuria: Secondary | ICD-10-CM | POA: Diagnosis not present

## 2023-08-02 DIAGNOSIS — R579 Shock, unspecified: Secondary | ICD-10-CM | POA: Diagnosis not present

## 2023-08-02 LAB — GLUCOSE, CAPILLARY
Glucose-Capillary: 151 mg/dL — ABNORMAL HIGH (ref 70–99)
Glucose-Capillary: 156 mg/dL — ABNORMAL HIGH (ref 70–99)
Glucose-Capillary: 124 mg/dL — ABNORMAL HIGH (ref 70–99)
Glucose-Capillary: 198 mg/dL — ABNORMAL HIGH (ref 70–99)
Glucose-Capillary: 108 mg/dL — ABNORMAL HIGH (ref 70–99)

## 2023-08-02 LAB — BLOOD GAS, ARTERIAL
Acid-Base Excess: 8.2 mmol/L — ABNORMAL HIGH (ref 0.0–2.0)
Bicarbonate: 34.4 mmol/L — ABNORMAL HIGH (ref 20.0–28.0)
Delivery systems: POSITIVE
Drawn by: 51133
FIO2: 80 %
O2 Saturation: 93.6 %
Patient temperature: 37
RATE: 15 {breaths}/min
pCO2 arterial: 53 mm[Hg] — ABNORMAL HIGH (ref 32–48)
pH, Arterial: 7.42 (ref 7.35–7.45)
pO2, Arterial: 62 mm[Hg] — ABNORMAL LOW (ref 83–108)

## 2023-08-02 LAB — RESPIRATORY PANEL BY PCR

## 2023-08-02 LAB — BASIC METABOLIC PANEL
Anion gap: 9 (ref 5–15)
BUN: 35 mg/dL — ABNORMAL HIGH (ref 8–23)
CO2: 30 mmol/L (ref 22–32)
Calcium: 7.2 mg/dL — ABNORMAL LOW (ref 8.9–10.3)
Chloride: 97 mmol/L — ABNORMAL LOW (ref 98–111)
Creatinine, Ser: 0.79 mg/dL (ref 0.61–1.24)
GFR, Estimated: >60 mL/min (ref >60)
Glucose, Bld: 156 mg/dL — ABNORMAL HIGH (ref 70–99)
Potassium: 3.2 mmol/L — ABNORMAL LOW (ref 3.5–5.1)
Sodium: 136 mmol/L (ref 135–145)

## 2023-08-02 LAB — CBC
HCT: 22.6 % — ABNORMAL LOW (ref 39.0–52.0)
Hemoglobin: 7.3 g/dL — ABNORMAL LOW (ref 13.0–17.0)
MCH: 30.4 pg (ref 26.0–34.0)
MCHC: 32.3 g/dL (ref 30.0–36.0)
MCV: 94.2 fL (ref 80.0–100.0)
Platelets: 7 10*3/uL — CL (ref 150–400)
RBC: 2.4 MIL/uL — ABNORMAL LOW (ref 4.22–5.81)
RDW: 16.7 % — ABNORMAL HIGH (ref 11.5–15.5)
WBC: 6.7 10*3/uL (ref 4.0–10.5)
nRBC: 1.4 % — ABNORMAL HIGH (ref 0.0–0.2)

## 2023-08-02 LAB — SEROTONIN RELEASE ASSAY (SRA)
SRA .2 IU/mL UFH Ser-aCnc: <1 % (ref 0–20)
SRA 100IU/mL UFH Ser-aCnc: <1 % (ref 0–20)

## 2023-08-02 LAB — PREPARE PLATELET PHERESIS: Unit division: 0

## 2023-08-02 LAB — PHOSPHORUS: Phosphorus: 2.3 mg/dL — ABNORMAL LOW (ref 2.5–4.6)

## 2023-08-02 LAB — BPAM PLATELET PHERESIS
Blood Product Expiration Date: 202410312359
ISSUE DATE / TIME: 202410281230
Unit Type and Rh: 6200

## 2023-08-02 LAB — MAGNESIUM: Magnesium: 2 mg/dL (ref 1.7–2.4)

## 2023-08-02 MED ORDER — SODIUM CHLORIDE 0.9% IV SOLUTION: Freq: Once | INTRAVENOUS | Status: DC

## 2023-08-02 MED ORDER — FUROSEMIDE 10 MG/ML IJ SOLN
60.0000 mg | Freq: Every day | INTRAMUSCULAR | Status: DC
  Administered 2023-08-03 – 2023-08-04 (×2): 60 mg via INTRAVENOUS
  Filled 2023-08-02 (×2): qty 6

## 2023-08-02 MED ORDER — POTASSIUM PHOSPHATES 15 MMOLE/5ML IV SOLN
30.0000 mmol | Freq: Once | INTRAVENOUS | Status: AC
  Administered 2023-08-02: 30 mmol via INTRAVENOUS
  Filled 2023-08-02: qty 10

## 2023-08-02 NOTE — Progress Notes (Signed)
   08/02/23 1622  BiPAP/CPAP/SIPAP  BiPAP/CPAP/SIPAP Pt Type Adult  BiPAP/CPAP/SIPAP V60  Mask Type Full face mask  Mask Size Large  Set Rate 15 breaths/min  Respiratory Rate 30 breaths/min  IPAP 12 cmH20  EPAP 6 cmH2O  FiO2 (%) 80 %  Minute Ventilation 24  Leak 13  Peak Inspiratory Pressure (PIP) 12  Tidal Volume (Vt) 556  Patient Home Equipment No  Auto Titrate No  BiPAP/CPAP /SiPAP Vitals  Resp (!) 30  BP (!) 152/75  Bilateral Breath Sounds Clear;Diminished

## 2023-08-02 NOTE — Progress Notes (Signed)
   08/02/23 0749  BiPAP/CPAP/SIPAP  BiPAP/CPAP/SIPAP Pt Type Adult  BiPAP/CPAP/SIPAP V60  Mask Type Full face mask  Mask Size Large  Set Rate 15 breaths/min  Respiratory Rate 35 breaths/min  IPAP 12 cmH20  EPAP 6 cmH2O  FiO2 (%) 80 %  Minute Ventilation 16  Leak 2  Peak Inspiratory Pressure (PIP) 14  Tidal Volume (Vt) 471  Patient Home Equipment No  Auto Titrate No  Press High Alarm 30 cmH2O  Press Low Alarm 5 cmH2O  CPAP/SIPAP surface wiped down Yes  BiPAP/CPAP /SiPAP Vitals  Resp (!) 35  BP (!) 142/74  SpO2 95 %  Bilateral Breath Sounds Clear;Diminished  MEWS Score/Color  MEWS Score 2  MEWS Score Color Yellow

## 2023-08-02 NOTE — Progress Notes (Signed)
[facility]                                                                              [name], is a 72 y.o. male, DOB - Feb 27, 1951, MWU:[id number] Admit date - 07/14/2023    Outpatient Primary MD for the patient is Morene Crocker, MD  LOS - 21  days  Chief Complaint  Patient presents with   Weakness       Brief summary   72 year old M with HTN, HLD, and prostate cancer, recently admitted for generalized weakness, found to have new metastatic neuroendocrine tumor to liver and adrenals.   Was discharged home with plans for outpatient chemo start, but became profoundly weak, returned to the hospital   In the ER, mild transaminitis due to tumor, mild hypokalemia, WBC normal, no fever.  UA with pyuria. 10/6: Started on CTX, admitted for weakness, Xtandi held 10/7:  Oncology consulted, planned for inpatient chemo 10/20: Acute hypoxic respiratory failure with increasing O2 requirements, transfer to stepdown, placed on Lasix, albumin, BiPAP 10/23: Escalating O2 needs, on 15 L O2 HFNC and BiPAP, PCCM consulted 10/25: On BiPAP overnight and NRB daily, platelets 11K.  Oncology, palliative medicine following  Assessment & Plan    Principal Problem: Septic shock, lactic acidosis, UTI -Urine culture showed Klebsiella oxytoca.  Blood culture has been negative.   -CT scan abdomen pelvis 10/8 that showed multifocal pneumonia, innumerable liver masses consistent with metastatic disease, multiple adrenal masses and ?Pancreatitis  Active Problems:   Acute respiratory failure with hypoxia (HCC) -At baseline, not on any O2 at home -On 10/20, patient was transferred to SDU due to markedly worsened respiratory status and increased O2 requirements. Chest x-ray on 10/20 showed pulmonary edema, atypical infection -CTA chest showed no PE, diffuse airspace opacities noted throughout both lungs consistent with pneumonia or possibly edema.  Hepatic and adrenal  metastatic lesions noted, T10 metastatic lesion. 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  HIT ab's 0.086, SRA negative -Transfused 1 unit platelets on 10/25, 10/26,10/27, 10/28 with no improvement -  platelets 7K today, transfuse 2 units  -Oncology following, ?Nplate of any use  Normocytic anemia: -Patient underwent EGD on 07/24/2023 -Colonoscopy 10/18 with findings of external hemorrhoids. Per GI, if bleeding occurs, ice pack can help and if bleeding persists, surgical eval may be required -Hb 7.3, transfuse for Hb <7  Essential hypertension -Continue metoprolol 5 mg every 6 hours   Acute Transaminitis -Likely due to shock liver on presentation    Prostate cancer metastatic to bone (HCC) -Xtandi is on hold for now while in hospital  Hypokalemia, hypophosphatemia, hypomagnesemia: -pharmacy assisting with electrolyte replacement    Hypocalcemia: -likely due to chronic illness   -improving   Moderate protein calorie malnutrition Nutrition Problem: Moderate Malnutrition Etiology: chronic illness, cancer and cancer related treatments Signs/Symptoms: mild fat  depletion, moderate muscle depletion, percent weight loss Interventions: Ensure Enlive (each supplement provides 350kcal and 20 grams of protein), Education  Estimated body mass index is 22.37 kg/m as calculated from the following:   Height as of this encounter: 5\' 9"  (1.753 m).   Weight as of this encounter: 68.7 kg.  Code Status: full code DVT Prophylaxis:  Place and maintain sequential compression device Start: 07/14/23 1101   Level of Care: Level of care: Stepdown Family Communication: Updated patient's daughter at the bedside on 10/28, another relative today at bedside  Disposition Plan:      Remains inpatient appropriate:      Procedures:    Consultants:   Pulm CCM Oncology Palliative medicine  Antimicrobials:   Anti-infectives (From admission, onward)    Start     Dose/Rate Route Frequency Ordered Stop   08/01/23 1630  sulfamethoxazole-trimethoprim (BACTRIM) 320 mg of trimethoprim in dextrose 5 % 500 mL IVPB        320 mg of trimethoprim 346.7 mL/hr over 90 Minutes Intravenous Every 6 hours 08/01/23 1559     08/01/23 1615  sulfamethoxazole-trimethoprim (BACTRIM) 463.36 mg of trimethoprim in dextrose 5 % 500 mL IVPB  Status:  Discontinued        20 mg/kg/day of trimethoprim  69.5 kg 352.6 mL/hr over 90 Minutes Intravenous Every 8 hours 08/01/23 1528 08/01/23 1558   07/29/23 1615  piperacillin-tazobactam (ZOSYN) IVPB 3.375 g        3.375 g 12.5 mL/hr over 240 Minutes Intravenous Every 8 hours 07/29/23 1525     07/29/23 1200  vancomycin (VANCOREADY) IVPB 750 mg/150 mL  Status:  Discontinued        750 mg 150 mL/hr over 60 Minutes Intravenous Every 12 hours 07/28/23 1309 07/29/23 1518   07/28/23 1400  ceFEPIme (MAXIPIME) 2 g in sodium chloride 0.9 % 100 mL IVPB  Status:  Discontinued        2 g 200 mL/hr over 30 Minutes Intravenous Every 8 hours 07/28/23 1302 07/29/23 1518   07/27/23 2100  ceFEPIme (MAXIPIME) 2 g in sodium chloride 0.9 % 100 mL IVPB        2 g 200  mL/hr over 30 Minutes Intravenous Every 8 hours 07/27/23 1259 07/28/23 0618   07/27/23 1100  vancomycin (VANCOREADY) IVPB 1750 mg/350 mL  Status:  Discontinued        1,750 mg 175 mL/hr over 120 Minutes Intravenous Every 24 hours 07/27/23 0950 07/28/23 1309   07/27/23 1030  ceFEPIme (MAXIPIME) 2 g in sodium chloride 0.9 % 100 mL IVPB  Status:  Discontinued        2 g 200 mL/hr over 30 Minutes Intravenous Every 8 hours 07/27/23 0937 07/27/23 1259   07/27/23 1030  vancomycin (VANCOREADY) IVPB 1250 mg/250 mL  Status:  Discontinued        1,250 mg 166.7 mL/hr over 90 Minutes Intravenous  Once 07/27/23 0937 07/27/23 0950   07/24/23 1700  cefTRIAXone (ROCEPHIN) 2 g in sodium chloride 0.9 % 100 mL IVPB  Status:  Discontinued        2 g 200 mL/hr over 30 Minutes Intravenous Daily 07/24/23 1630 07/25/23 1453   07/24/23 1700  azithromycin (ZITHROMAX) 500 mg in sodium chloride 0.9 % 250 mL IVPB  Status:  Discontinued        500 mg 250 mL/hr over 60 Minutes Intravenous Daily 07/24/23 1630 07/25/23 1453   07/17/23 1200  Ampicillin-Sulbactam (UNASYN) 3 g in sodium chloride 0.9 % 100 mL IVPB        3 g 200 mL/hr over 30 Minutes Intravenous Every 6 hours 07/17/23 1148 07/18/23 0049   07/14/23 1200  Ampicillin-Sulbactam (UNASYN) 3 g in sodium chloride 0.9 % 100 mL IVPB  Status:  Discontinued        3 g 200 mL/hr over 30 Minutes Intravenous Every 6 hours 07/14/23 1028 07/17/23 1148   07/13/23 0300  vancomycin (VANCOREADY) IVPB 750 mg/150 mL  Status:  Discontinued        750 mg 150 mL/hr over 60 Minutes Intravenous Every 12 hours 07/12/23 1403 07/14/23 0830   07/12/23 1430  vancomycin (VANCOREADY) IVPB 1500 mg/300 mL        1,500 mg 150 mL/hr over 120 Minutes Intravenous  Once 07/12/23 1402 07/12/23 1826   07/12/23 1200  meropenem (MERREM) 1 g in sodium chloride 0.9 % 100 mL IVPB  Status:  Discontinued        1 g 200 mL/hr over 30 Minutes Intravenous Every 8 hours 07/12/23 1102 07/14/23 1028   07/11/23  1800  cefTRIAXone (ROCEPHIN) 1 g in sodium chloride 0.9 % 100 mL IVPB  Status:  Discontinued        1 g 200 mL/hr over 30 Minutes Intravenous Every 24 hours 07/27/2023 1911 07/12/23 1102   07/19/2023 1800  cefTRIAXone (ROCEPHIN) 1 g in sodium chloride 0.9 % 100 mL IVPB        1 g 200 mL/hr over 30 Minutes Intravenous  Once 07/24/2023 1745 08/03/2023 2132          Medications  sodium chloride   Intravenous Once   Chlorhexidine Gluconate Cloth  6 each Topical Q2200   docusate sodium  100 mg Oral BID   dorzolamide  1 drop Right Eye BID   feeding supplement  1 Container Oral TID BM   [START ON 08/03/2023] furosemide  60 mg Intravenous Daily   Gerhardt's butt cream   Topical BID   insulin aspart  0-15 Units Subcutaneous TID WC   insulin aspart  0-5 Units Subcutaneous QHS   insulin aspart  4 Units Subcutaneous TID WC   insulin glargine-yfgn  11 Units Subcutaneous Daily   methylPREDNISolone (SOLU-MEDROL) injection  60 mg Intravenous Q24H   metoprolol tartrate  5 mg Intravenous Q6H   mouth rinse  15 mL Mouth Rinse 4 times per day   pantoprazole (PROTONIX) IV  40 mg Intravenous Q12H   sodium chloride flush  3 mL Intravenous Q12H   timolol  1 drop Right Eye BID      Subjective:   William Jones was seen and examined today.  On Bipap, overnight was on HFNC 15L,  respiratory status remains tenuous.  Family member at bed side. Afebrile, + back pain.   Vitals:   08/02/23 1515 08/02/23 1600 08/02/23 1622 08/02/23 1656  BP: (!) 154/79 (!) 152/75 (!) 152/75 (!) 173/84  Pulse: (!) 113 (!) 119  (!) 118  Resp: 14 (!) 22 (!) 30 (!) 33  Temp: 99 F (37.2 C)  98.4 F (36.9 C)  TempSrc: Axillary   Axillary  SpO2: 96% 93%  95%  Weight:      Height:        Intake/Output Summary (Last 24 hours) at 08/02/2023 1737 Last data filed at 08/02/2023 1728 Gross per 24 hour  Intake 3640.47 ml  Output 1950 ml  Net 1690.47 ml     Wt Readings from Last 3 Encounters:  08/02/23 68.7 kg  07/07/23  68.3 kg  07/05/23 71.1 kg    Physical Exam General: Alert and oriented, uncomfortable/appears dyspneic on Bipap  Cardiovascular: S1 S2 clear, RRR.  Respiratory: diminished BS throughout.  Gastrointestinal: Soft, nontender, nondistended, NBS Ext: no pedal edema bilaterally Neuro: no new deficits Psych:   Data Reviewed:  I have personally reviewed following labs    CBC Lab Results  Component Value Date   WBC 6.7 08/02/2023   RBC 2.40 (L) 08/02/2023   HGB 7.3 (L) 08/02/2023   HCT 22.6 (L) 08/02/2023   MCV 94.2 08/02/2023   MCH 30.4 08/02/2023   PLT 7 (LL) 08/02/2023   MCHC 32.3 08/02/2023   RDW 16.7 (H) 08/02/2023   LYMPHSABS 0.4 (L) 07/30/2023   MONOABS 0.2 07/30/2023   EOSABS 0.0 07/30/2023   BASOSABS 0.0 07/30/2023     Last metabolic panel Lab Results  Component Value Date   NA 136 08/02/2023   K 3.2 (L) 08/02/2023   CL 97 (L) 08/02/2023   CO2 30 08/02/2023   BUN 35 (H) 08/02/2023   CREATININE 0.79 08/02/2023   GLUCOSE 156 (H) 08/02/2023   GFRNONAA >60 08/02/2023   GFRAA 103 12/18/2019   CALCIUM 7.2 (L) 08/02/2023   PHOS 2.3 (L) 08/02/2023   PROT 5.1 (L) 07/26/2023   ALBUMIN 2.1 (L) 07/26/2023   LABGLOB 2.4 04/10/2019   AGRATIO 1.6 04/10/2019   BILITOT 0.9 07/26/2023   ALKPHOS 263 (H) 07/26/2023   AST 39 07/26/2023   ALT 64 (H) 07/26/2023   ANIONGAP 9 08/02/2023    CBG (last 3)  Recent Labs    08/02/23 0817 08/02/23 1119 08/02/23 1521  GLUCAP 156* 124* 198*      Coagulation Profile: No results for input(s): "INR", "PROTIME" in the last 168 hours.   Radiology Studies: I have personally reviewed the imaging studies  No results found.     Thad Ranger M.D. [facility] 08/02/2023, 5:37 PM  Available via Epic secure chat 7am-7pm After 7 pm, please refer to night coverage provider listed on amion.

## 2023-08-02 NOTE — Progress Notes (Addendum)
Pharmacy Electrolyte Replacement  Recent Labs:  Recent Labs    08/02/23 0309  K 3.2*  MG 2.0  PHOS 2.3*  CREATININE 0.79   Patient diuresing extensively on BID IV Lasix since 10/20 for fluid overload and increased WOB. with great difficulty keeping up with electrolytes. Asked by Dr. Rhona Leavens 10/22 for pharmacy to help with monitoring and electrolyte supplementation. - We are also contributing to fluid overload with all the electrolyte supplementation bags of fluid.  UOP 10/20: 4225 UOP 10/21: 2550 UOP:10/22: 3525 ml UOP 10/23: 2850 ml UOP 10/24: UOP 10/25: again UOP 10/26: 1900 ml UOP 10/27: 1200 ml UOP 10/28 1900 ml  Today, 08/02/23  - K 3.2:  Pt has also been started on Bactrim IV q6h  - Mg 2.0 WNL - Phos 2.3 WNL - CorrCa 8.7, WNL  Plan: Regular diet but unable to be off respiratory support to take pills    - BMET, Mg, Phos daily - Ca Gluconate 2g IV daily scheduled   - Mag Sulfate 1g IV daily  scheduled given today  - Phos: potassium phosphate 30 mmol IV x1  - Potassium: potassium phosphate 30 mmol IV x1  - remains on furosemide 60 mg IV q12h   - When IV Lasix stops or UOP declines, we will have to be vigilant to decrease/stop all these supplements.   Adalberto Cole, PharmD, BCPS 08/02/2023 9:53 AM  ADDENDUM  - Lasix decreased to 60 mg IV daily/ Will leave orders as is as most have been on the lower end of ranges.    Adalberto Cole, PharmD, BCPS 08/02/2023 10:13 AM

## 2023-08-02 NOTE — Progress Notes (Signed)
   08/02/23 0315  BiPAP/CPAP/SIPAP  BiPAP/CPAP/SIPAP Pt Type Adult  BiPAP/CPAP/SIPAP V60  Mask Type Full face mask  Mask Size Large  Set Rate 15 breaths/min  Respiratory Rate 34 breaths/min  IPAP 12 cmH20  EPAP 6 cmH2O  FiO2 (%) 80 %  Minute Ventilation 17.3  Leak 0  Peak Inspiratory Pressure (PIP) 12  Tidal Volume (Vt) 600  Patient Home Equipment No  Auto Titrate No  Press High Alarm 30 cmH2O  Press Low Alarm 5 cmH2O  Oxygen Percent 80 %   RT called regarding pts sats being in the 70's and increased WOB. NRB was placed over the HHFNC, pt sats went up to 90% but then dropped back down to the 60's. RT placed pt on bipap sats now 95% and work of breathing has improved.

## 2023-08-02 NOTE — Progress Notes (Addendum)
Offered BiPAP placement to PT after RN had to add 15 LPM NRB mask to PT over HHFNC system. PT declined and wishes to be able to speak to daughter that is currently at bedside (vitals on Flowsheet). RN aware.

## 2023-08-02 NOTE — Progress Notes (Signed)
Chaplain engaged in a brief follow-up to check in with William Jones and William Jones family. They were doing well as bedside and did not have any needs at this time.    08/02/23 1600  Spiritual Encounters  Type of Visit Follow up  Reason for visit Routine spiritual support

## 2023-08-02 NOTE — Progress Notes (Signed)
ABG sent to lab, lab made aware. 

## 2023-08-02 NOTE — Progress Notes (Signed)
Removed PT from BiPAP (PT requested) and placed on heated high flow nasal cannula. PT tolerating well at this time (settings and vitals on Flowsheet). RN aware. BiPAP remains at bedside PRN.

## 2023-08-02 NOTE — Progress Notes (Signed)
CROSS COVER NOTE  NAME: BELA SOKOLOFF MRN: [id number] DOB : Dec 11, 1950    Date of Service   08/02/2023   HPI/Events of Note   Nurse paged because pt desaturating while of HFNC at rest. Chart review shows similar occurrence requiring BiPAP initiation.  Interventions   Plan: Pt placed on BiPAP and ABG ordered. ABG    Component Value Date/Time   PHART 7.42 08/02/2023 0350   PCO2ART 53 (H) 08/02/2023 0350   PO2ART 62 (L) 08/02/2023 0350   HCO3 34.4 (H) 08/02/2023 0350   TCO2 25 06/29/2023 2313   O2SAT 93.6 08/02/2023 0350    Pt appears comfort and resting on the BiPAP     Traquan Duarte Lamin Almir Botts, MSN, APRN, AGACNP-BC [facility] [place] Pager: [phone]. Check Amion for Availability

## 2023-08-02 NOTE — Progress Notes (Addendum)
NAME:  William Jones, MRN:  [id number], DOB:  Jun 22, 1951, LOS: 21 ADMISSION DATE:  07/26/2023, CONSULTATION DATE:  10/8 REFERRING MD:  Dr. Maryfrances Bunnell, CHIEF COMPLAINT:  hypotension, lactic acidosis   History of Present Illness:  72 year old male with PMH as below, which is significant for prostate cancer, substance abuse, GI bleeding, HTN, and HLD. He takes Xtandi for metastatic prostate Ca. Recently admitted for weakness and was found to have diffuse hepatic metastatic disease and metastasis to the adrenal glands as well. 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 Primary vs mets uncertain. He was discharged and followed up with oncology 10/3 with plans to start carboplatin and etoposide 10/8, but unfortunately he once again became weak and presented to [facility] ED 10/6. He was concerned he would be unable to get the care he needed at home and would miss appointments due to feeling weak and was hopeful to be admitted to the hospital. He was admitted to the hospital for generalized weakness and possible UTI. He was seen by oncology inpatient and was scheduled to start inpatient chemotherapy, however, in the AM hours of 10/8 he became tachycardic and labs indicated worsening acidosis. Lactic acid was check and was found to be greater than 9. The patient was transferred to ICU for closer monitoring and PCCM was consulted. The patient was treated with IVF. BP and LA improved. PCCM signed off.   Subsequent course has been complicated by Klebsiella UTI, anemia, and hypoxia. Anemia was evaluated by GI with upper and lower scopes. Lower scope found hemorrhoids but no clear evidence of bleeding. Hypoxia has been progressive. CXR was concerning for PNA vs edema and he has been treated with CAP antibiotics as well as diuresis, but hypoxia continued to worsen. PCCM called back 10/23 for hypoxia. DNR/DNI. Daughter updated.   Pertinent  Medical History   has a past medical  history of Angiodysplasia of colon with hemorrhage (10/14/2020), Duodenitis, Essential hypertension (07/16/2015), Fatigue (11/06/2020), Glaucoma, H/O: substance abuse (HCC), Hemorrhage of rectum and anus (10/14/2020), History of frostbite, History of prostate cancer (03/20/2014), Hyperlipidemia (11/20/2019), Iron deficiency anemia, Malignant tumor of prostate (HCC) (10/14/2020), Peripheral neuropathy, S/P radiation therapy (09/18/14-11/15/14), and Thrombocytopenia (HCC).   Significant Hospital Events: Including procedures, antibiotic start and stop dates in addition to other pertinent events   10/6 admit for generalized weakness 10/8 tx to ICU for LA 9  improved with volume 10/11 EGD erosive gastropathy with no bleeding stigmata 09-Aug-2023 colonoscopy with diverticulosis and external hemorrhoids, no bleeding.  10/23 PCCM consult for hypoxia CT no obvious PE. Diffuse airspace opacities concerning for edema or infection.  10/24 Breathing a little better this morning, on 12L [place] and 15L nRB. BiPAP intermittently overnight.  10/25 Unable to tolerate BIPAP overnight, remains on [place] and NRB  Interim History / Subjective:   Wants BiPAP off, can't breathe, but has been desaturating without it.   Objective   Blood pressure 138/63, pulse 84, temperature 98.6 F (37 C), temperature source Oral, resp. rate 20, height 5\' 9"  (1.753 m), weight 68.7 kg, SpO2 97%.    FiO2 (%):  [80 %-100 %] 80 %   Intake/Output Summary (Last 24 hours) at 08/02/2023 0844 Last data filed at 08/02/2023 0800 Gross per 24 hour  Intake 2277.03 ml  Output 1900 ml  Net 377.03 ml   Filed Weights   07/31/23 0450 08/01/23 0256 08/02/23 0500  Weight: 64.3 kg 69.5 kg 68.7 kg    Examination:  General:  frail elderly appearing gentleman Neuro:  Alert, oriented, non-focal HEENT:  [place]/AT, PERRL, No JVD Cardiovascular:  RRR, no MRG. No edema.  Lungs:  Crackles in posterior bases.  Abdomen:  Soft, non-distended, non-tender.   Musculoskeletal:  No acute deformity Skin:  Intact, MMM   Resolved Hospital Problem list   UTI - klebsiella oxytoca Lactic acidosis  Assessment & Plan:   Acute respiratory failure with hypoxia: Most consistent with pulmonary edema, but not really improving with diuresis. CTA negative for PE. Diffuse opacities. He has failed broad spectrum ABX, steroids, and diuresis. Concern for lymphangitic spread vs maybe PJP? RVP negative PJP smear need collected Sputum culture pending Fungitell pending.  PCT 23 BUN 150 ESR 25 CRP 4.7 LDH 936 Echo showing possible infiltrative process > not mentioned on echo from 9/26. P: -Continue supplemental oxygen wean for sat goal > 92% - BiPAP at bedtime and PRN if he can tolerate - Heated high flow otherwise.  - May have met out limit on diuretics based on climbing BUN, but that may be due to steroids as well. Will decrease to daily dosing and can taper down from there.  - Continue zosyn, bactrim - Discussed echocardiogram with Cardiology (Dr. Cristal Deer). She feels echo findings are unlikely to represent amyloidosis. Even if an extensive workup did ultimately diagnose amyloid the treatment options would only stabilize the current condition, or require chemotherapy, which we already know he is not a candidate for. Outcome would not change.  - He has had prolonged courses of diuresis, antibiotics, and steroids. If he does not respond to Bactrim, then unfortunately there is nothing else to offer.   Malignant neuroendocrine carcinoma with metastatics to the liver and adrenals    Prostate cancer (adeno) with bone mets P: - Management per oncology - Palliation recommended - Trend LFTs  - Supportive care   Anemia  -s/p EGD and colonoscopy with no evidence of bleeding. Erosive gastropathy and external hemorrhoids  Severe thrombocytopenia  Moderate protein calorie malnutrition  Other issues   As of 10/28 - full medical care but DNR/DNI Discussed with  patient and family 12/28. They want to exhaust all options including transfer to tertiary care facility. I think there is nothing the be added by another facility and risk of transfer is quite high and would likely require intubation. He would be almost impossible to liberate from the ventilator as it stands currently. They seem to understand. Also addressed goals of care very briefly as they have been inquired about this often. I simply stated it would be reasonable to pursue comfort care whenever they so choose.     Best Practice (right click and "Reselect all SmartList Selections" daily)   Per primary   Critical care time:     SIGNATURE    Joneen Roach, AGACNP-BC [facility]  See Amion for personal pager PCCM on call pager [phone] until 7pm. Please call Elink 7p-7a. [phone]  08/02/2023 8:44 AM

## 2023-08-03 ENCOUNTER — Inpatient Hospital Stay (HOSPITAL_COMMUNITY): Payer: 59

## 2023-08-03 DIAGNOSIS — R579 Shock, unspecified: Secondary | ICD-10-CM | POA: Diagnosis not present

## 2023-08-03 LAB — CBC
HCT: 20.2 % — ABNORMAL LOW (ref 39.0–52.0)
HCT: 24.2 % — ABNORMAL LOW (ref 39.0–52.0)
Hemoglobin: 6.3 g/dL — CL (ref 13.0–17.0)
Hemoglobin: 8.1 g/dL — ABNORMAL LOW (ref 13.0–17.0)
MCH: 30.7 pg (ref 26.0–34.0)
MCH: 30 pg (ref 26.0–34.0)
MCHC: 31.2 g/dL (ref 30.0–36.0)
MCHC: 33.5 g/dL (ref 30.0–36.0)
MCV: 98.5 fL (ref 80.0–100.0)
MCV: 89.6 fL (ref 80.0–100.0)
Platelets: 16 10*3/uL — CL (ref 150–400)
Platelets: 16 10*3/uL — CL (ref 150–400)
RBC: 2.05 MIL/uL — ABNORMAL LOW (ref 4.22–5.81)
RBC: 2.7 MIL/uL — ABNORMAL LOW (ref 4.22–5.81)
RDW: 17.4 % — ABNORMAL HIGH (ref 11.5–15.5)
RDW: 16.7 % — ABNORMAL HIGH (ref 11.5–15.5)
WBC: 7.4 10*3/uL (ref 4.0–10.5)
WBC: 8 10*3/uL (ref 4.0–10.5)
nRBC: 3.1 % — ABNORMAL HIGH (ref 0.0–0.2)
nRBC: 3.6 % — ABNORMAL HIGH (ref 0.0–0.2)

## 2023-08-03 LAB — BPAM PLATELET PHERESIS
Blood Product Expiration Date: 202410312359
Blood Product Expiration Date: 202411012359
ISSUE DATE / TIME: 202410291452
ISSUE DATE / TIME: 202410291223
Unit Type and Rh: 5100
Unit Type and Rh: 5100

## 2023-08-03 LAB — BASIC METABOLIC PANEL
Anion gap: 12 (ref 5–15)
Anion gap: 12 (ref 5–15)
BUN: 28 mg/dL — ABNORMAL HIGH (ref 8–23)
BUN: 22 mg/dL (ref 8–23)
CO2: 28 mmol/L (ref 22–32)
CO2: 29 mmol/L (ref 22–32)
Calcium: 6.8 mg/dL — ABNORMAL LOW (ref 8.9–10.3)
Calcium: 7.2 mg/dL — ABNORMAL LOW (ref 8.9–10.3)
Chloride: 94 mmol/L — ABNORMAL LOW (ref 98–111)
Chloride: 91 mmol/L — ABNORMAL LOW (ref 98–111)
Creatinine, Ser: 0.67 mg/dL (ref 0.61–1.24)
Creatinine, Ser: 0.43 mg/dL — ABNORMAL LOW (ref 0.61–1.24)
GFR, Estimated: >60 mL/min (ref >60)
GFR, Estimated: >60 mL/min (ref >60)
Glucose, Bld: 148 mg/dL — ABNORMAL HIGH (ref 70–99)
Glucose, Bld: 204 mg/dL — ABNORMAL HIGH (ref 70–99)
Potassium: 2.8 mmol/L — ABNORMAL LOW (ref 3.5–5.1)
Potassium: 3.3 mmol/L — ABNORMAL LOW (ref 3.5–5.1)
Sodium: 134 mmol/L — ABNORMAL LOW (ref 135–145)
Sodium: 132 mmol/L — ABNORMAL LOW (ref 135–145)

## 2023-08-03 LAB — FUNGITELL BETA-D-GLUCAN
Fungitell Value:: >500 pg/mL
Result Name:: POSITIVE — AB

## 2023-08-03 LAB — GLUCOSE, CAPILLARY
Glucose-Capillary: 114 mg/dL — ABNORMAL HIGH (ref 70–99)
Glucose-Capillary: 117 mg/dL — ABNORMAL HIGH (ref 70–99)
Glucose-Capillary: 129 mg/dL — ABNORMAL HIGH (ref 70–99)
Glucose-Capillary: 176 mg/dL — ABNORMAL HIGH (ref 70–99)
Glucose-Capillary: 218 mg/dL — ABNORMAL HIGH (ref 70–99)
Glucose-Capillary: 83 mg/dL (ref 70–99)
Glucose-Capillary: 88 mg/dL (ref 70–99)

## 2023-08-03 LAB — PNEUMOCYSTIS JIROVECI SMEAR BY DFA

## 2023-08-03 LAB — PREPARE PLATELET PHERESIS
Unit division: 0
Unit division: 0

## 2023-08-03 LAB — PHOSPHORUS: Phosphorus: 2.9 mg/dL (ref 2.5–4.6)

## 2023-08-03 LAB — MAGNESIUM
Magnesium: 2 mg/dL (ref 1.7–2.4)
Magnesium: 1.9 mg/dL (ref 1.7–2.4)

## 2023-08-03 LAB — PREPARE RBC (CROSSMATCH)

## 2023-08-03 MED ORDER — SODIUM CHLORIDE 0.9% IV SOLUTION: Freq: Once | INTRAVENOUS | Status: AC

## 2023-08-03 MED ORDER — INSULIN ASPART 100 UNIT/ML IJ SOLN
0.0000 [IU] | INTRAMUSCULAR | Status: DC
  Administered 2023-08-03: 3 [IU] via SUBCUTANEOUS
  Administered 2023-08-03: 1 [IU] via SUBCUTANEOUS
  Administered 2023-08-04 (×2): 2 [IU] via SUBCUTANEOUS

## 2023-08-03 MED ORDER — POTASSIUM CHLORIDE 10 MEQ/100ML IV SOLN
10.0000 meq | INTRAVENOUS | Status: AC
  Administered 2023-08-03 – 2023-08-04 (×5): 10 meq via INTRAVENOUS
  Filled 2023-08-03 (×5): qty 100

## 2023-08-03 MED ORDER — POTASSIUM CHLORIDE 10 MEQ/100ML IV SOLN
10.0000 meq | INTRAVENOUS | Status: AC
  Administered 2023-08-03 (×6): 10 meq via INTRAVENOUS
  Filled 2023-08-03 (×6): qty 100

## 2023-08-03 MED ORDER — CLONIDINE HCL 0.1 MG/24HR TD PTWK
0.1000 mg | MEDICATED_PATCH | TRANSDERMAL | Status: DC
  Administered 2023-08-03: 0.1 mg via TRANSDERMAL
  Filled 2023-08-03: qty 1

## 2023-08-03 MED ORDER — SODIUM CHLORIDE 0.9 % IV SOLN
100.0000 mg | INTRAVENOUS | Status: DC
  Administered 2023-08-03: 100 mg via INTRAVENOUS
  Filled 2023-08-03 (×2): qty 5

## 2023-08-03 NOTE — Progress Notes (Signed)
Pt remains on BIPAP at this time due to high FIO2 requirements. Family at bedside.

## 2023-08-03 NOTE — Progress Notes (Signed)
NAME:  William Jones, MRN:  [id number], DOB:  [birth year], LOS: 22 ADMISSION DATE:  07/06/2023, CONSULTATION DATE:  07/12/2023 REFERRING MD:  Maryfrances Bunnell - [facility] CHIEF COMPLAINT: Hypotension, lactic acidosis   History of Present Illness:  72 year old man who presented to [facility] ED 10/6 for weakness, hypotension. PMHx significant for prostate cancer, substance abuse, GI bleeding, HTN, and HLD. He takes Xtandi for metastatic prostate Ca.   Recently admitted for weakness and was found to have diffuse hepatic metastatic disease with additional metastasis to the adrenal glands. 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 Primary vs. mets uncertain. He was discharged and followed up with oncology 10/3 with plans to start carboplatin and etoposide 10/8, but unfortunately he once again became weak and presented to [facility] ED 10/6. He was concerned he would be unable to get the care he needed at home and would miss appointments due to feeling weak and was hopeful to be admitted to the hospital. He was admitted to the hospital for generalized weakness and possible UTI. He was seen by oncology inpatient and was scheduled to start inpatient chemotherapy, however, in the AM hours of 10/8 he became tachycardic and labs indicated worsening acidosis. Lactic acid was check and was found to be greater than 9. The patient was transferred to ICU for closer monitoring and PCCM was consulted. The patient was treated with IVF. BP and LA improved. PCCM signed off.   Subsequent course has been complicated by Klebsiella UTI, anemia, and hypoxia. Anemia was evaluated by GI with upper and lower scopes. Lower scope found hemorrhoids but no clear evidence of bleeding. Hypoxia has been progressive. CXR was concerning for PNA vs edema and he has been treated with CAP antibiotics as well as diuresis, but hypoxia continued to worsen. PCCM called back 10/23 for hypoxia. DNR/DNI. Daughter updated.   Pertinent  Medical History:   Past Medical History:  Diagnosis Date   Angiodysplasia of colon with hemorrhage 10/14/2020   Duodenitis    Essential hypertension 07/16/2015   Fatigue 11/06/2020   Glaucoma    H/O: substance abuse (HCC)    Hemorrhage of rectum and anus 10/14/2020   History of frostbite    History of prostate cancer 03/20/2014   Lost to f/u with [facility] in the past 2011 with elevated PSA >30 at that time.  Saw [facility] (Dr. Isabel Caprice) on 03/13/14. Cancer Noted 03/13/14 office visit. Gleason score 7. Plan CT Ab/pelvis with contrast, bone scan    Hyperlipidemia 11/20/2019   Iron deficiency anemia    Malignant tumor of prostate (HCC) 10/14/2020   Peripheral neuropathy    S/P radiation therapy 09/18/14-11/15/14   prostate/ 7800Gy/40sessions   Thrombocytopenia (HCC)    Significant Hospital Events: Including procedures, antibiotic start and stop dates in addition to other pertinent events   10/6 Admit for generalized weakness 10/8 Tx to ICU for LA 9  improved with volume 10/11 EGD erosive gastropathy with no bleeding stigmata 10/18 Colonoscopy with diverticulosis and external hemorrhoids, no bleeding.  10/23 PCCM consult for hypoxia CT no obvious PE. Diffuse airspace opacities concerning for edema or infection.  10/24 Breathing a little better this morning, on 12L [place] and 15L nRB. BiPAP intermittently overnight.  10/25 Unable to tolerate BIPAP overnight, remains on [place] and NRB  Interim History/Subjective:  No significant events overnight Required BiPAP overnight/continuously Persistent high O2 support needs; HHFNC 95% FiO2 60L C/o generalized abdominal discomfort Multiple family members at bedside  Objective:   Blood pressure [name])  187/92, pulse (!) 110, temperature 97.6 F (36.4 C), temperature source Axillary, resp. rate (!) 37, height 5\' 9"  (1.753 m), weight 69.7 kg, SpO2 95%.    FiO2 (%):  [80 %-95 %] 80 %   Intake/Output Summary (Last 24 hours) at 08/03/2023  0859 Last data filed at 08/03/2023 0430 Gross per 24 hour  Intake 3364.19 ml  Output 1450 ml  Net 1914.19 ml   Filed Weights   08/01/23 0256 08/02/23 0500 08/03/23 0628  Weight: 69.5 kg 68.7 kg 69.7 kg   Physical Examination: General: Chronically ill-appearing older man in NAD. Fatigued. HEENT: [place]/AT, anicteric sclera, PERRL, dry mucous membranes 2/2 BiPAP. Neuro: Awake, oriented x 3-4. Responds to verbal stimuli. Following commands consistently. Moves all 4 extremities spontaneously. Generalized weakness.  CV: Mildly tachycardic to 100s, regular rhythm, no m/g/r. PULM: Breathing tachypneic and mildly labored on BiPAP. Lung fields diminished throughout, faint expiratory wheezing. GI: Soft, nondistended, mild generalized TTP. Normoactive bowel sounds. Extremities: Trace bilateral symmetric LE edema noted. Bilateral 1+ UE edema noted, L > R. Skin: Warm/dry, no rashes.  Resolved Hospital Problem List:   UTI - klebsiella oxytoca Lactic acidosis  Assessment & Plan:   Acute respiratory failure with hypoxia Most consistent with pulmonary edema, but not really improving with diuresis. CTA negative for PE. Diffuse opacities. He has failed broad spectrum ABX, steroids, and diuresis. Concern for lymphangitic spread vs maybe PJP? Fungitell resulted positive 10/30, higher degree of suspicion for PJP/fungal process. RVP negative, sputum culture prelim - abundant yeast. PCT 23, BUN 150, ESR 25, CRP 4.7, LDH 936. Echo showing possible infiltrative process > not mentioned on Echo from 9/26; per Cards unlikely amyloidosis. - Supplemental O2 support for SpO2 > 92%, intermittently tolerating HHFNC - BiPAP continuous, can reduce to PRN if tolerating HHFNC - Bronchodilators PRN - Solumedrol 60mg  IV Q24H - Renal function a limitation to further diuresis - Pulmonary hygiene - Continune Zosyn x 7 day course (end 10/31) - Continue Bactrim while awaiting PJP smear results - Micafungin 100mg  IV Q24H added  in the setting of +Fungitell  Malignant neuroendocrine carcinoma with metastatics to the liver and adrenals    Prostate cancer (adeno) with bone mets - Management per Oncology - Palliative Care recommended - Trend LFTs - Supportive care  Anemia Severe thrombocytopenia  S/p EGD and colonoscopy with no evidence of bleeding. Erosive gastropathy and external hemorrhoids. - Trend H&H - Monitor for signs of active bleeding - Transfuse for Hgb < 7.0, Plt < 20 or hemodynamically significant bleeding  Moderate protein calorie malnutrition  - Nutrition/RD consult - Nutritional supplements limited in the setting of BiPAP, inability to take PO - May need to consider enteral access, pending clinical trajectory  GOC As of 10/28 - full medical care but DNR/DNI. Discussed with patient and family 12/28. They want to exhaust all options including transfer to tertiary care facility. Fear there is nothing to be added by another facility and risk of transfer is quite high and would likely require intubation. He would be almost impossible to liberate from the ventilator as it stands currently. They seem to understand. Also addressed goals of care very briefly as they have been inquired about this often. I simply stated it would be reasonable to pursue comfort care whenever they so choose. - DNR/DNI code status - We are near exhausting all options for treatment without significant improvement - Will add micafungin as above (given positive Fungitell) and see if this changes clinical course to at least liberate from BiPAP,  uncertain we will make significant strides with this  Best Practice: (right click and "Reselect all SmartList Selections" daily)   Per Primary Team  Signature:    Faythe Ghee [facility] 08/03/23 9:32 AM  Please see [email or web address] for pager details.  From 7A-7P if no response, please call [phone] After hours, please call ELink [phone]

## 2023-08-03 NOTE — Progress Notes (Signed)
Pharmacy Electrolyte Replacement  Recent Labs:  Recent Labs    08/03/23 0311  K 2.8*  MG 2.0  PHOS 2.9  CREATININE 0.67   Patient requiring diuresis due to be fluid overloaded with resulting electrolyte abnormalities. Pharmacy consulted on 10/22 to assist with electrolyte monitoring and supplementation.   Patient is unable to take PO. All supplementation required to be IV at this time, contributing to additional fluid volume.   UOP 10/20: 4225 UOP 10/21: 2550 UOP:10/22: 3525 ml UOP 10/23: 2850 ml UOP 10/24: UOP 10/25: again UOP 10/26: 1900 ml UOP 10/27: 1200 ml UOP 10/28 1900 ml UOP 10/29 1450 mL  Medication changes that could impact electrolytes: -10/28: SMX/TMP IV initiated -10/30: Furosemide reduced to 60 mg IV daily  Today, 08/03/23  - K 2.8 low despite being started on IV SMX/TMP - Mg 2.0 WNL - Phos 2.9 WNL - CorrCa 8.3, slightly low  Plan:  - BMET, Mg, Phos daily - KCl 10 mEq IV x 6 runs ordered by provider - Ca Gluconate 2g IV daily  - Mag Sulfate 1g IV daily   - furosemide 60 mg IV q24h   Recheck potassium later this evening.   Pharmacy to follow daily labs. If IV furosemide is discontinued or UOP declines, will likely need to discontinue daily scheduled supplementation.   Cindi Carbon, PharmD 08/03/23 7:54 AM

## 2023-08-03 NOTE — Progress Notes (Signed)
Pharmacy Electrolyte Replacement  Recent Labs:  Recent Labs    08/03/23 0311 08/03/23 1644  K 2.8* 3.3*  MG 2.0 1.9  PHOS 2.9  --   CREATININE 0.67 0.43*   Patient requiring diuresis due to be fluid overloaded with resulting electrolyte abnormalities. Pharmacy consulted on 10/22 to assist with electrolyte monitoring and supplementation.   Patient is unable to take PO. All supplementation required to be IV at this time, contributing to additional fluid volume.   UOP 10/20: 4225 UOP 10/21: 2550 UOP:10/22: 3525 ml UOP 10/23: 2850 ml UOP 10/24: UOP 10/25: again UOP 10/26: 1900 ml UOP 10/27: 1200 ml UOP 10/28 1900 ml UOP 10/29 1450 mL UOP 10/30: 1200 mL  Medication changes that could impact electrolytes: -10/28: SMX/TMP IV initiated -10/30: Furosemide reduced to 60 mg IV daily  Today, 08/03/23  - K 3.3 low despite being started on IV SMX/TMP - Mg 1.9 WNL - Phos 2.9 WNL - CorrCa 8.7, slightly low but improved  Plan:  - BMET, Mg, Phos daily - KCl 10 mEq IV x 6 runs ordered by Dr. Allena Katz - Ca Gluconate 2g IV daily  - Mag Sulfate 1g IV daily   - furosemide 60 mg IV q24h   F/u AM BMP  Pharmacy to follow daily labs. If IV furosemide is discontinued or UOP declines, will likely need to discontinue daily scheduled supplementation.    Cherylin Mylar, PharmD Clinical Pharmacist  10/30/20246:20 PM

## 2023-08-03 NOTE — Progress Notes (Signed)
Pt currently on HHFN 60L/95% and NRB 100%.

## 2023-08-03 NOTE — Progress Notes (Signed)
Pt remains on BIPAP at this time. Family at bedside.

## 2023-08-03 NOTE — Progress Notes (Signed)
[facility]                                                                              William Jones, is a 72 y.o. male, DOB - [birth year], ZOX:[id number] Admit date - 07/17/2023    Outpatient Primary MD for the patient is Morene Crocker, MD  LOS - 22  days  Chief Complaint  Patient presents with   Weakness       Brief summary   72 year old M with HTN, HLD, and prostate cancer, recently admitted for generalized weakness, found to have new metastatic neuroendocrine tumor to liver and adrenals.   Was discharged home with plans for outpatient chemo start, but became profoundly weak, returned to the hospital   In the ER, mild transaminitis due to tumor, mild hypokalemia, WBC normal, no fever.  UA with pyuria. 10/6: Started on CTX, admitted for weakness, Xtandi held 10/7:  Oncology consulted, planned for inpatient chemo 10/20: Acute hypoxic respiratory failure with increasing O2 requirements, transfer to stepdown, placed on Lasix, albumin, BiPAP 10/23: Escalating O2 needs, on 15 L O2 HFNC and BiPAP, PCCM consulted 10/25: On BiPAP overnight and NRB daily, platelets 11K.  Oncology, palliative medicine following  Assessment & Plan  Septic shock, lactic acidosis, UTI -Urine culture showed Klebsiella oxytoca.  Blood culture has been negative.   -CT scan abdomen pelvis 10/8 that showed multifocal pneumonia, innumerable liver masses consistent with metastatic disease, multiple adrenal masses and ?Pancreatitis  Acute respiratory failure with hypoxia (HCC) -At baseline, not on any O2 at home -On 10/20, patient was transferred to SDU due to markedly worsened respiratory status and increased O2 requirements. Chest x-ray on 10/20 showed pulmonary edema, atypical infection -CTA chest showed no PE, diffuse airspace opacities noted throughout both lungs consistent with pneumonia or possibly edema.  Hepatic and adrenal metastatic lesions noted, T10 metastatic lesion. -  resp status still tenous, on Bipap this am, overnight on HFNC - CCM following, on lasix IV dec to 60mg  daily, IV Solu-Medrol tapered to 60 mg daily, IV Zosyn (changed on 10/25), added IV bactrim on 10/28 to cover PJP - Per Oncology, possibility of lymphangitic spread if no improvement with current management Prognosis guarded.  Family discussing amongst himself with regards to goals of care.  No improvement as of 10/30.    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  HIT ab's 0.086, SRA negative -Transfused 1 unit platelets on 10/25, 10/26,10/27, 10/28 with no improvement -  platelets 7K today, transfuse 2 units  -Oncology following, ?Nplate of any use  Normocytic anemia: -Patient underwent EGD on 08/01/2023 -Colonoscopy 2023/08/06 with findings of external hemorrhoids. Per GI, if bleeding occurs, ice pack can help and if bleeding persists, surgical eval may be  required Transfusion hemoglobin less than 7.  Transfuse for platelet count less than 10,000.    Essential hypertension -Continue metoprolol 5 mg every 6 hours  Acute Transaminitis -Likely due to shock liver on presentation    Prostate cancer metastatic to bone (HCC) -Xtandi is on hold for now while in hospital  Hypokalemia, hypophosphatemia, hypomagnesemia: -pharmacy assisting with electrolyte replacement    Hypocalcemia: -likely due to chronic illness   -improving   Moderate protein calorie malnutrition Nutrition Problem:  Moderate Malnutrition Etiology: chronic illness, cancer and cancer related treatments Signs/Symptoms: mild fat depletion, moderate muscle depletion, percent weight loss Interventions: Ensure Enlive (each supplement provides 350kcal and 20 grams of protein), Education  Estimated body mass index is 22.69 kg/m as calculated from the following:   Height as of this encounter: 5\' 9"  (1.753 m).   Weight as of this encounter: 69.7 kg.  Code Status: full code DVT Prophylaxis:  Place and maintain sequential compression device Start: 07/14/23 1101   Level of Care: Level of care: Stepdown Family Communication: Family at bedside. Disposition Plan:      Remains inpatient appropriate:   On BiPAP at 80% FiO2 as well as on NRB plus heated high flow at 50 L.    Procedures:  BiPAP  Consultants:   Pulm CCM Oncology Palliative medicine  Antimicrobials:   Anti-infectives (From admission, onward)    Start     Dose/Rate Route Frequency Ordered Stop   08/03/23 2000  micafungin (MYCAMINE) 100 mg in sodium chloride 0.9 % 100 mL IVPB        100 mg 105 mL/hr over 1 Hours Intravenous Every 24 hours 08/03/23 1728     08/01/23 1630  sulfamethoxazole-trimethoprim (BACTRIM) 320 mg of trimethoprim in dextrose 5 % 500 mL IVPB        320 mg of trimethoprim 346.7 mL/hr over 90 Minutes Intravenous Every 6 hours 08/01/23 1559     08/01/23 1615  sulfamethoxazole-trimethoprim (BACTRIM) 463.36 mg of trimethoprim in dextrose 5 % 500 mL IVPB  Status:  Discontinued        20 mg/kg/day of trimethoprim  69.5 kg 352.6 mL/hr over 90 Minutes Intravenous Every 8 hours 08/01/23 1528 08/01/23 1558   07/29/23 1615  piperacillin-tazobactam (ZOSYN) IVPB 3.375 g        3.375 g 12.5 mL/hr over 240 Minutes Intravenous Every 8 hours 07/29/23 1525     07/29/23 1200  vancomycin (VANCOREADY) IVPB 750 mg/150 mL  Status:  Discontinued        750 mg 150 mL/hr over 60 Minutes Intravenous Every 12 hours 07/28/23 1309 07/29/23 1518    07/28/23 1400  ceFEPIme (MAXIPIME) 2 g in sodium chloride 0.9 % 100 mL IVPB  Status:  Discontinued        2 g 200 mL/hr over 30 Minutes Intravenous Every 8 hours 07/28/23 1302 07/29/23 1518   07/27/23 2100  ceFEPIme (MAXIPIME) 2 g in sodium chloride 0.9 % 100 mL IVPB        2 g 200 mL/hr over 30 Minutes Intravenous Every 8 hours 07/27/23 1259 07/28/23 0618   07/27/23 1100  vancomycin (VANCOREADY) IVPB 1750 mg/350 mL  Status:  Discontinued        1,750 mg 175 mL/hr over 120 Minutes Intravenous Every 24 hours 07/27/23 0950 07/28/23 1309   07/27/23 1030  ceFEPIme (MAXIPIME) 2 g in sodium chloride 0.9 % 100 mL IVPB  Status:  Discontinued        2 g 200 mL/hr over 30 Minutes  Intravenous Every 8 hours 07/27/23 0937 07/27/23 1259   07/27/23 1030  vancomycin (VANCOREADY) IVPB 1250 mg/250 mL  Status:  Discontinued        1,250 mg 166.7 mL/hr over 90 Minutes Intravenous  Once 07/27/23 0937 07/27/23 0950   07/24/23 1700  cefTRIAXone (ROCEPHIN) 2 g in sodium chloride 0.9 % 100 mL IVPB  Status:  Discontinued        2 g 200 mL/hr over 30 Minutes Intravenous Daily 07/24/23 1630 07/25/23 1453   07/24/23 1700  azithromycin (ZITHROMAX) 500 mg in sodium chloride 0.9 % 250 mL IVPB  Status:  Discontinued        500 mg 250 mL/hr over 60 Minutes Intravenous Daily 07/24/23 1630 07/25/23 1453   07/17/23 1200  Ampicillin-Sulbactam (UNASYN) 3 g in sodium chloride 0.9 % 100 mL IVPB        3 g 200 mL/hr over 30 Minutes Intravenous Every 6 hours 07/17/23 1148 07/18/23 0049   07/14/23 1200  Ampicillin-Sulbactam (UNASYN) 3 g in sodium chloride 0.9 % 100 mL IVPB  Status:  Discontinued        3 g 200 mL/hr over 30 Minutes Intravenous Every 6 hours 07/14/23 1028 07/17/23 1148   07/13/23 0300  vancomycin (VANCOREADY) IVPB 750 mg/150 mL  Status:  Discontinued        750 mg 150 mL/hr over 60 Minutes Intravenous Every 12 hours 07/12/23 1403 07/14/23 0830   07/12/23 1430  vancomycin (VANCOREADY) IVPB 1500 mg/300 mL         1,500 mg 150 mL/hr over 120 Minutes Intravenous  Once 07/12/23 1402 07/12/23 1826   07/12/23 1200  meropenem (MERREM) 1 g in sodium chloride 0.9 % 100 mL IVPB  Status:  Discontinued        1 g 200 mL/hr over 30 Minutes Intravenous Every 8 hours 07/12/23 1102 07/14/23 1028   07/11/23 1800  cefTRIAXone (ROCEPHIN) 1 g in sodium chloride 0.9 % 100 mL IVPB  Status:  Discontinued        1 g 200 mL/hr over 30 Minutes Intravenous Every 24 hours 07/13/2023 1911 07/12/23 1102   07/06/2023 1800  cefTRIAXone (ROCEPHIN) 1 g in sodium chloride 0.9 % 100 mL IVPB        1 g 200 mL/hr over 30 Minutes Intravenous  Once 07/31/2023 1745 07/29/2023 2132          Medications  Chlorhexidine Gluconate Cloth  6 each Topical Q2200   cloNIDine  0.1 mg Transdermal Weekly   docusate sodium  100 mg Oral BID   dorzolamide  1 drop Right Eye BID   feeding supplement  1 Container Oral TID BM   furosemide  60 mg Intravenous Daily   Gerhardt's butt cream   Topical BID   insulin aspart  0-9 Units Subcutaneous Q4H   methylPREDNISolone (SOLU-MEDROL) injection  60 mg Intravenous Q24H   metoprolol tartrate  5 mg Intravenous Q6H   mouth rinse  15 mL Mouth Rinse 4 times per day   pantoprazole (PROTONIX) IV  40 mg Intravenous Q12H   sodium chloride flush  3 mL Intravenous Q12H   timolol  1 drop Right Eye BID      Subjective:   No new complaint.  Ongoing cough and shortness of breath.  Ongoing fatigue.  Ongoing tiredness but does not remember receiving IV pain medication early this morning.  Vitals:   08/03/23 1600 08/03/23 1700 08/03/23 1800 08/03/23 1900  BP: 138/61 (!) 181/84 (!) 179/82 (!) 154/70  Pulse: (!) 102 (!) 108 (!) 108 (!) 122  Resp: 17 17 (!) 24 (!) 22  Temp:      TempSrc:      SpO2: 96% 95% 94% 94%  Weight:      Height:        Intake/Output Summary (Last 24 hours) at 08/03/2023 1931 Last data filed at 08/03/2023 1830 Gross per 24 hour  Intake 3362.82 ml  Output 1850 ml  Net 1512.82 ml     Wt  Readings from Last 3 Encounters:  08/03/23 69.7 kg  07/07/23 68.3 kg  07/05/23 71.1 kg    Physical Exam Alert and awake.  Oriented to self. Bilateral basal crackles.  No wheezing. Bowel sound present. Nontender. No thrush.  Multiple ulcers seen on oral cavity.  Data Reviewed:  I have personally reviewed following labs    CBC Lab Results  Component Value Date   WBC 8.0 08/03/2023   RBC 2.70 (L) 08/03/2023   HGB 8.1 (L) 08/03/2023   HCT 24.2 (L) 08/03/2023   MCV 89.6 08/03/2023   MCH 30.0 08/03/2023   PLT 16 (LL) 08/03/2023   MCHC 33.5 08/03/2023   RDW 16.7 (H) 08/03/2023   LYMPHSABS 0.4 (L) 07/30/2023   MONOABS 0.2 07/30/2023   EOSABS 0.0 07/30/2023   BASOSABS 0.0 07/30/2023     Last metabolic panel Lab Results  Component Value Date   NA 132 (L) 08/03/2023   K 3.3 (L) 08/03/2023   CL 91 (L) 08/03/2023   CO2 29 08/03/2023   BUN 22 08/03/2023   CREATININE 0.43 (L) 08/03/2023   GLUCOSE 204 (H) 08/03/2023   GFRNONAA >60 08/03/2023   GFRAA 103 12/18/2019   CALCIUM 7.2 (L) 08/03/2023   PHOS 2.9 08/03/2023   PROT 5.1 (L) 07/26/2023   ALBUMIN 2.1 (L) 07/26/2023   LABGLOB 2.4 04/10/2019   AGRATIO 1.6 04/10/2019   BILITOT 0.9 07/26/2023   ALKPHOS 263 (H) 07/26/2023   AST 39 07/26/2023   ALT 64 (H) 07/26/2023   ANIONGAP 12 08/03/2023    CBG (last 3)  Recent Labs    08/03/23 1157 08/03/23 1548 08/03/23 1926  GLUCAP 114* 218* 129*      Coagulation Profile: No results for input(s): "INR", "PROTIME" in the last 168 hours.   Radiology Studies: I have personally reviewed the imaging studies  DG CHEST PORT 1 VIEW  Result Date: 08/03/2023 CLINICAL DATA:  Shortness of breath EXAM: PORTABLE CHEST 1 VIEW COMPARISON:  07/27/2023 FINDINGS: Shallow inspiration. Heart size is normal for technique. Diffuse airspace disease throughout the lungs, possibly representing edema, pneumonia, aspiration, or ARDS. Appearances are similar to prior study. No pleural effusions.  No pneumothorax. Mediastinal contours appear intact. IMPRESSION: Persistent finding of diffuse bilateral pulmonary infiltrates. No improvement. Electronically Signed   By: Burman Nieves M.D.   On: 08/03/2023 16:49       Lynden Oxford M.D. [facility] 08/03/2023, 7:31 PM  Available via Epic secure chat 7am-7pm After 7 pm, please refer to night coverage provider listed on amion.

## 2023-08-04 DIAGNOSIS — R579 Shock, unspecified: Secondary | ICD-10-CM | POA: Diagnosis not present

## 2023-08-04 LAB — TYPE AND SCREEN
ABO/RH(D): A POS
Antibody Screen: NEGATIVE
Unit division: 0

## 2023-08-04 LAB — CBC WITH DIFFERENTIAL/PLATELET
Abs Immature Granulocytes: 0.15 10*3/uL — ABNORMAL HIGH (ref 0.00–0.07)
Basophils Absolute: 0 10*3/uL (ref 0.0–0.1)
Basophils Relative: 0 %
Eosinophils Absolute: 0 10*3/uL (ref 0.0–0.5)
Eosinophils Relative: 0 %
HCT: 26.2 % — ABNORMAL LOW (ref 39.0–52.0)
Hemoglobin: 8.6 g/dL — ABNORMAL LOW (ref 13.0–17.0)
Immature Granulocytes: 2 %
Lymphocytes Relative: 5 %
Lymphs Abs: 0.4 10*3/uL — ABNORMAL LOW (ref 0.7–4.0)
MCH: 30.1 pg (ref 26.0–34.0)
MCHC: 32.8 g/dL (ref 30.0–36.0)
MCV: 91.6 fL (ref 80.0–100.0)
Monocytes Absolute: 0.2 10*3/uL (ref 0.1–1.0)
Monocytes Relative: 3 %
Neutro Abs: 7.5 10*3/uL (ref 1.7–7.7)
Neutrophils Relative %: 90 %
Platelets: 17 10*3/uL — CL (ref 150–400)
RBC: 2.86 MIL/uL — ABNORMAL LOW (ref 4.22–5.81)
RDW: 17.5 % — ABNORMAL HIGH (ref 11.5–15.5)
WBC: 8.3 10*3/uL (ref 4.0–10.5)
nRBC: 4.1 % — ABNORMAL HIGH (ref 0.0–0.2)

## 2023-08-04 LAB — BPAM RBC
Blood Product Expiration Date: 202411202359
ISSUE DATE / TIME: 202410300911
Unit Type and Rh: 6200

## 2023-08-04 LAB — PHOSPHORUS: Phosphorus: 1 mg/dL — CL (ref 2.5–4.6)

## 2023-08-04 LAB — C-REACTIVE PROTEIN: CRP: 3.8 mg/dL — ABNORMAL HIGH (ref <1.0)

## 2023-08-04 LAB — MAGNESIUM: Magnesium: 1.8 mg/dL (ref 1.7–2.4)

## 2023-08-04 LAB — GLUCOSE, CAPILLARY
Glucose-Capillary: 177 mg/dL — ABNORMAL HIGH (ref 70–99)
Glucose-Capillary: 82 mg/dL (ref 70–99)

## 2023-08-04 LAB — BASIC METABOLIC PANEL
Anion gap: 12 (ref 5–15)
BUN: 19 mg/dL (ref 8–23)
CO2: 26 mmol/L (ref 22–32)
Calcium: 7.1 mg/dL — ABNORMAL LOW (ref 8.9–10.3)
Chloride: 94 mmol/L — ABNORMAL LOW (ref 98–111)
Creatinine, Ser: 0.46 mg/dL — ABNORMAL LOW (ref 0.61–1.24)
GFR, Estimated: >60 mL/min (ref >60)
Glucose, Bld: 82 mg/dL (ref 70–99)
Potassium: 3.8 mmol/L (ref 3.5–5.1)
Sodium: 132 mmol/L — ABNORMAL LOW (ref 135–145)

## 2023-08-04 LAB — PROCALCITONIN: Procalcitonin: 21.82 ng/mL

## 2023-08-04 MED ORDER — POTASSIUM PHOSPHATES 15 MMOLE/5ML IV SOLN
45.0000 mmol | Freq: Once | INTRAVENOUS | Status: AC
  Administered 2023-08-04: 45 mmol via INTRAVENOUS
  Filled 2023-08-04: qty 15

## 2023-08-04 MED ORDER — POLYVINYL ALCOHOL 1.4 % OP SOLN: 1.0000 [drp] | Freq: Four times a day (QID) | OPHTHALMIC | Status: DC | PRN

## 2023-08-04 MED ORDER — ACETAMINOPHEN 650 MG RE SUPP: 650.0000 mg | Freq: Four times a day (QID) | RECTAL | Status: DC | PRN

## 2023-08-04 MED ORDER — HYDROMORPHONE BOLUS VIA INFUSION
1.0000 mg | INTRAVENOUS | Status: DC | PRN
  Administered 2023-08-04: 1 mg via INTRAVENOUS

## 2023-08-04 MED ORDER — HYDROMORPHONE HCL-NACL 50-0.9 MG/50ML-% IV SOLN
0.5000 mg/h | INTRAVENOUS | Status: DC
  Administered 2023-08-04: 0.5 mg/h via INTRAVENOUS
  Filled 2023-08-04: qty 50

## 2023-08-04 MED ORDER — GLYCOPYRROLATE 0.2 MG/ML IJ SOLN: 0.2000 mg | INTRAMUSCULAR | Status: DC | PRN

## 2023-08-04 MED ORDER — HYDROMORPHONE HCL-NACL 50-0.9 MG/50ML-% IV SOLN
0.0000 mg/h | INTRAVENOUS | Status: DC
  Filled 2023-08-04: qty 50

## 2023-08-04 MED ORDER — GLYCOPYRROLATE 1 MG PO TABS: 1.0000 mg | ORAL_TABLET | ORAL | Status: DC | PRN

## 2023-08-04 MED ORDER — MIDAZOLAM HCL 2 MG/2ML IJ SOLN: 2.0000 mg | INTRAMUSCULAR | Status: DC | PRN

## 2023-08-04 MED ORDER — SODIUM CHLORIDE 0.9% FLUSH: 10.0000 mL | Freq: Two times a day (BID) | INTRAVENOUS | Status: DC

## 2023-08-04 MED ORDER — ACETAMINOPHEN 325 MG PO TABS: 650.0000 mg | ORAL_TABLET | Freq: Four times a day (QID) | ORAL | Status: DC | PRN

## 2023-08-05 NOTE — Progress Notes (Signed)
Patient was placed on full support Bipap this AM d/t ongoing dyspnea and hypoxia on HHFNC with NRBM. RN assessed pt at bedside again when patient began to desat on 100% FiO2 on Bipap. Patient's O2 saturation sustaining in low 80's. CCM at bedside, and advised RN to give 2 mg IV dilaudid and scheduled IV lasix at this time. Patient's daughter, [name], called by charge RN to come to hospital as soon as possible. CCM ordered dilaudid drip to assist with WOB. This RN will continue to decrease stimulation and provide interventions as ordered to decrease WOB. Patient is currently 85% on Bipap and is resting. Accessory muscle use and tachypnea is still noted by this RN.

## 2023-08-05 NOTE — Progress Notes (Addendum)
35 mL IV Dilaudid wasted in Stericycle with Ranee Gosselin, RN.

## 2023-08-05 NOTE — Death Summary Note (Signed)
DEATH SUMMARY   Patient Details  Name: William Jones MRN: [id number] DOB: Feb 26, 1951 WUJ:[id number], Byrd Hesselbach, MD Admission/Discharge Information   Admit Date:  07-29-2023  Date of Death: Date of Death: 23-Aug-2023  Time of Death: Time of Death: 09-03-1111  Length of Stay: 15-Sep-2023   Principle Cause of death: Acute hypoxic respiratory failure secondary to malignant neuroendocrine carcinoma with lymphangitic spread of his cancer as well as acute on chronic diastolic CHF.  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)   Essential hypertension   Prostate cancer metastatic to bone (HCC)   Type 2 diabetes mellitus (HCC)   Anemia of chronic disease   Hypokalemia   Malnutrition of moderate degree   Hypoalbuminemia   Shock liver   Lactic acidosis   Acute respiratory failure with hypoxia (HCC)   Acute pulmonary edema (HCC)   Acute cystitis without hematuria   Need for emotional support   Counseling and coordination of care   Goals of care, counseling/discussion   Palliative care encounter   Generalized weakness   High risk medication use   Medication management   Concern about end of life   Hospital Course: 72 year old M with HTN, HLD, and prostate cancer, recently admitted for generalized weakness, found to have new metastatic neuroendocrine tumor to liver and adrenals.   Was discharged home with plans for outpatient chemo start, but became profoundly weak, returned to the hospital   In the ER, mild transaminitis due to tumor, mild hypokalemia, WBC normal, no fever.  UA with pyuria. 07-29-23: Started on CTX, admitted for weakness, Xtandi held 10/7:  Oncology consulted, planned for inpatient chemo 10/20: Acute hypoxic respiratory failure with increasing O2 requirements, transfer to stepdown, placed on Lasix, albumin, BiPAP 10/23: Escalating O2 needs, on 15 L O2 HFNC and BiPAP, PCCM  consulted 10/25: On BiPAP overnight and NRB daily, platelets 11K.  Oncology, palliative medicine following 08-23-23.  Patient has been on BiPAP and unable to come off of heated high flow plus NRB or BiPAP with 80% FiO2. Patient does not want to go back on BiPAP and wants to stop everything.  He verbalized this in front of his daughter as well.  Plan was to continue current care. 2023-08-23 respiratory status progressively worsening.  PCCM discussed with family.  Family agrees with transition to complete comfort.  Patient was initiated on Dilaudid infusion for respiratory distress.  Patient passed away at 11:12 AM.  Assessment and Plan: Septic shock, lactic acidosis, UTI Shock physiology initially occurred on 10/8. -Urine culture showed Klebsiella oxytoca.  Blood culture has been negative.   -CT scan abdomen pelvis 10/8 that showed multifocal pneumonia, innumerable liver masses consistent with metastatic disease, multiple adrenal masses Treated with antibiotics.   Acute respiratory failure with hypoxia (HCC) -At baseline, not on any O2 at home Was treated initially for septic shock. -On 10/20, patient was transferred to SDU due to markedly worsened respiratory status and increased O2 requirements. Chest x-ray on 10/20 showed pulmonary edema, atypical infection -CTA chest showed no PE, diffuse airspace opacities noted throughout both lungs consistent with pneumonia or possibly edema.  Hepatic and adrenal metastatic lesions noted, T10 metastatic lesion. - CCM following, on lasix IV, IV Solu-Medrol , IV Zosyn  Briefly added IV bactrim on 10/28 to cover PJP Per Oncology, possibility of lymphangitic spread since no improvement with current management Prognosis is poor.  Still unable to come off of high oxygen demand.  Family decided to transition to complete comfort.   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  Patient was DNR.   Acute severe thrombocytopenia -Transfused 1 unit platelets on 10/25, 10/26,10/27, 10/28 with no improvement Platelet count remained severely low but stable for the last 2 checks.   Normocytic anemia -Patient underwent EGD on 08/01/2023 -Colonoscopy 10/18 with findings of external hemorrhoids. Per GI plan was, if bleeding occurs, ice pack can help and if bleeding persists, surgical eval may be required   Essential hypertension Treated with metoprolol 5 mg and clonidine.   Acute Transaminitis -Likely due to shock liver on presentation     Prostate cancer metastatic to bone (HCC) -Xtandi was on hold   Hypokalemia, hypophosphatemia, hypomagnesemia: Replaced.  Hypocalcemia: Replaced.   Moderate protein calorie malnutrition Nutrition Problem: Moderate Malnutrition Etiology: chronic illness, cancer and cancer related treatments Signs/Symptoms: mild fat depletion, moderate muscle depletion, percent weight loss Interventions: Ensure Enlive (each supplement provides 350kcal and 20 grams of protein), Education  Procedures: Echocardiogram BiPAP Blood transfusion. EGD colonoscopy  Consultations:  GI PCCM Palliative care Oncology  The results of significant diagnostics from this hospitalization (including imaging, microbiology, ancillary and laboratory) are listed below for reference.   Significant Diagnostic Studies: DG CHEST PORT 1 VIEW  Result Date: 08/03/2023 CLINICAL DATA:  Shortness of breath EXAM: PORTABLE CHEST 1 VIEW COMPARISON:  07/27/2023 FINDINGS: Shallow inspiration. Heart size is normal for technique. Diffuse airspace disease throughout the lungs, possibly representing edema, pneumonia, aspiration, or ARDS. Appearances are similar to prior  study. No pleural effusions. No pneumothorax. Mediastinal contours appear intact. IMPRESSION: Persistent finding of diffuse bilateral pulmonary infiltrates. No improvement. Electronically Signed   By: Burman Nieves M.D.   On: 08/03/2023 16:49   ECHOCARDIOGRAM COMPLETE  Result Date: 07/28/2023    ECHOCARDIOGRAM LIMITED REPORT   Patient Name:   William Jones Date of Exam: 07/28/2023 Medical Rec #:  [id number]             Height:       69.0 in Accession #:    [id number]            Weight:       137.1 lb Date of Birth:  08-28-1951              BSA:          1.760 m Patient Age:    72 years              BP:           126/71 mmHg Patient Gender: M                     HR:           109 bpm. Exam Location:  Inpatient Procedure: 2D Echo Indications:    Dyspnea R06.22  History:        Patient has prior history of Echocardiogram examinations, most                 recent 06/30/2023. Risk Factors:Hypertension and Dyslipidemia.  Sonographer:    Harriette Bouillon RDCS Referring Phys: [phone] Clarene Critchley HOFFMAN IMPRESSIONS  1. Hyperdynamic LV with moderate concentric LVH and mid-cavity obliteration. Given significant LVH and aortic valve thickening consideration should  be given to a possible infiltrative process (i.e. cardiac amyloid). . Left ventricular ejection fraction,  by estimation, is >75%. The left ventricle has hyperdynamic function. The left ventricle has no regional wall motion abnormalities. There is moderate concentric left ventricular hypertrophy. Left ventricular diastolic parameters are consistent with Grade I diastolic dysfunction (impaired relaxation).  2. Right ventricular systolic function is normal. The right ventricular size is normal.  3. The mitral valve is normal in structure. No evidence of mitral valve regurgitation. No evidence of mitral stenosis.  4. The aortic valve is tricuspid. There is mild calcification of the aortic valve. Aortic valve regurgitation is mild to moderate. No aortic stenosis is  present.  5. The inferior vena cava is normal in size with greater than 50% respiratory variability, suggesting right atrial pressure of 3 mmHg. FINDINGS  Left Ventricle: Hyperdynamic LV with moderate concentric LVH and mid-cavity obliteration. Given significant LVH and aortic valve thickening consideration should be given to a possible infiltrative process (i.e. cardiac amyloid). Left ventricular ejection fraction, by estimation, is >75%. The left ventricle has hyperdynamic function. The left ventricle has no regional wall motion abnormalities. The left ventricular internal cavity size was normal in size. There is moderate concentric left ventricular hypertrophy. Left ventricular diastolic parameters are consistent with Grade I diastolic dysfunction (impaired relaxation). Right Ventricle: The right ventricular size is normal. No increase in right ventricular wall thickness. Right ventricular systolic function is normal. Left Atrium: Left atrial size was normal in size. Right Atrium: Right atrial size was normal in size. Pericardium: There is no evidence of pericardial effusion. Mitral Valve: The mitral valve is normal in structure. No evidence of mitral valve stenosis. Tricuspid Valve: The tricuspid valve is normal in structure. Tricuspid valve regurgitation is not demonstrated. No evidence of tricuspid stenosis. Aortic Valve: The aortic valve is tricuspid. There is mild calcification of the aortic valve. Aortic valve regurgitation is mild to moderate. No aortic stenosis is present. Pulmonic Valve: The pulmonic valve was normal in structure. Pulmonic valve regurgitation is trivial. No evidence of pulmonic stenosis. Aorta: The aortic root is normal in size and structure. Venous: The inferior vena cava is normal in size with greater than 50% respiratory variability, suggesting right atrial pressure of 3 mmHg. IAS/Shunts: No atrial level shunt detected by color flow Doppler. LEFT VENTRICLE PLAX 2D LVIDd:         2.90  cm   Diastology LVIDs:         2.00 cm   LV e' medial: 4.68 cm/s LV PW:         1.30 cm LV IVS:        1.30 cm LVOT diam:     2.00 cm LV SV:         32 LV SV Index:   18 LVOT Area:     3.14 cm  RIGHT VENTRICLE RV S prime:     25.10 cm/s TAPSE (M-mode): 1.2 cm LEFT ATRIUM         Index LA diam:    2.20 cm 1.25 cm/m  AORTIC VALVE LVOT Vmax:   100.00 cm/s LVOT Vmean:  71.400 cm/s LVOT VTI:    0.101 m  AORTA Ao Root diam: 3.40 cm Ao Asc diam:  3.40 cm  SHUNTS Systemic VTI:  0.10 m Systemic Diam: 2.00 cm Arvilla Meres MD Electronically signed by Arvilla Meres MD Signature Date/Time: 07/28/2023/4:22:41 PM    Final    CT Angio Chest Pulmonary Embolism (PE) W or WO Contrast  Result Date: 07/27/2023 CLINICAL DATA:  Hypoxia. EXAM: CT ANGIOGRAPHY CHEST WITH CONTRAST TECHNIQUE: Multidetector CT imaging of the chest was performed using the standard protocol during bolus administration of intravenous contrast. Multiplanar CT image reconstructions and MIPs were obtained to evaluate the vascular anatomy. RADIATION DOSE REDUCTION: This exam was performed according to the departmental dose-optimization program which includes automated exposure control, adjustment of the mA and/or kV according to patient size and/or use of iterative reconstruction technique. CONTRAST:  75mL OMNIPAQUE IOHEXOL 350 MG/ML SOLN COMPARISON:  July 12, 2023. FINDINGS: Cardiovascular: Satisfactory opacification of the pulmonary arteries to the segmental level. No evidence of pulmonary embolism. Normal heart size. No pericardial effusion. Mediastinum/Nodes: No enlarged mediastinal, hilar, or axillary lymph nodes. Thyroid gland, trachea, and esophagus demonstrate no significant findings. Lungs/Pleura: Diffuse airspace opacities are noted throughout both lungs most consistent with pneumonia or possibly edema. No pneumothorax or pleural effusion is noted. Upper Abdomen: Multiple hepatic metastatic lesions are again noted as well as bilateral adrenal  metastatic lesions. Musculoskeletal: Probable T10 metastatic lesion is noted. Review of the MIP images confirms the above findings. IMPRESSION: No definite evidence of pulmonary embolus. Diffuse airspace opacities are noted throughout both lungs most consistent with pneumonia or possibly edema. Hepatic and adrenal metastatic lesions are noted. Probable T10 metastatic lesion. Electronically Signed   By: Lupita Raider M.D.   On: 07/27/2023 19:40   DG CHEST PORT 1 VIEW  Result Date: 07/27/2023 CLINICAL DATA:  72 year old male with hypoxia. Metastatic cancer, inpatient chemotherapy, sepsis. EXAM: PORTABLE CHEST 1 VIEW COMPARISON:  Portable chest 07/24/2023 and earlier. FINDINGS: Portable AP semi upright view at 0843 hours. Slightly lower lung volumes. Increased diffuse and coarse reticulonodular lung opacity, still asymmetrically greater on the left. No superimposed pneumothorax, consolidation, pleural effusion. Visualized tracheal air column is within normal limits. No acute osseous abnormality identified. Negative visible bowel gas. IMPRESSION: Progressed asymmetric, diffuse pulmonary interstitial opacity since 07/24/2023. Top differential considerations are drug reaction, progressed asymmetric pulmonary edema and viral/atypical respiratory infection. Electronically Signed   By: Odessa Fleming M.D.   On: 07/27/2023 11:55   DG CHEST PORT 1 VIEW  Result Date: 07/24/2023 CLINICAL DATA:  Hypoxia EXAM: PORTABLE CHEST 1 VIEW COMPARISON:  Chest x-ray dated July 10, 2019 FINDINGS: Cardiac and mediastinal contours are unchanged. New diffuse interstitial opacities. No large pleural effusion. No evidence of pneumothorax. IMPRESSION: New diffuse interstitial opacities, likely due to pulmonary edema. Atypical infection is an additional consideration. Electronically Signed   By: Allegra Lai M.D.   On: 07/24/2023 11:05   CT Angio Chest Pulmonary Embolism (PE) W or WO Contrast  Result Date: 07/12/2023 CLINICAL DATA:   Abdomen pain fatigue EXAM: CT ANGIOGRAPHY CHEST CT ABDOMEN AND PELVIS WITH CONTRAST TECHNIQUE: Multidetector CT imaging of the chest was performed using the standard protocol during bolus administration of intravenous contrast. Multiplanar CT image reconstructions and MIPs were obtained to evaluate the vascular anatomy. Multidetector CT imaging of the abdomen and pelvis was performed using the standard protocol during bolus administration of intravenous contrast. RADIATION DOSE REDUCTION: This exam was performed according to the departmental dose-optimization program which includes automated exposure control, adjustment of the mA and/or kV according to patient size and/or use of iterative reconstruction technique. CONTRAST:  OMNIPAQUE IOHEXOL 350 MG/ML SOLN COMPARISON:  Chest x-ray 07/29/2023, CT 06/30/2023 FINDINGS: CTA CHEST FINDINGS Cardiovascular: Satisfactory opacification of the pulmonary arteries to the segmental level. No evidence of pulmonary embolism. Mild aortic atherosclerosis. No aneurysm. No dissection. Normal cardiac size. No  pericardial effusion Mediastinum/Nodes: Midline trachea. No thyroid mass. No suspicious lymph nodes. Esophagus within normal limits. Lungs/Pleura: Mild emphysema. No pleural effusion or pneumothorax. Interim development of multifocal bilateral heterogeneous ground-glass densities. Musculoskeletal: Sternum appears intact. Mild sclerosis in the T10 vertebral body on the left side, corresponding to recent bone scan finding and suspect for skeletal osseous metastatic disease Review of the MIP images confirms the above findings. CT ABDOMEN and PELVIS FINDINGS Hepatobiliary: Innumerable hypodense liver masses consistent with metastatic disease. No calcified gallstone. No biliary dilatation Pancreas: No ductal dilatation. New small volume fluid surrounding the distal body and tail of pancreas. Spleen: Normal in size without focal abnormality. Adrenals/Urinary Tract: Bilateral  adrenal masses consistent with metastatic disease. Kidneys show no hydronephrosis. 7 mm soft tissue nodule in the left perirenal space on series 2, image 32, appears separate from kidney and difficult to exclude small metastatic focus. Punctate nonobstructing right kidney stone. Urinary bladder is unremarkable Stomach/Bowel: The stomach is nonenlarged. Mild eccentric wall thickening at the posterior fundus. No small bowel distension. Fluid in the colon. Negative appendix Vascular/Lymphatic: Moderate aortic atherosclerosis. No aneurysm. No suspicious lymph nodes. Reproductive: Negative prostate other than fiducial markers Other: No free air Musculoskeletal: No acute osseous abnormality Review of the MIP images confirms the above findings. IMPRESSION: 1. Negative for acute pulmonary embolus. 2. Interim development of multifocal bilateral heterogeneous ground-glass densities, suspect for multifocal pneumonia, possible atypical/viral organism. 3. Innumerable liver masses consistent with metastatic disease or multifocal liver malignancy. Bilateral adrenal masses consistent with metastatic disease. 4. Mild eccentric wall thickening at the posterior fundus of stomach, could be correlated with endoscopy as indicated 5. New small volume fluid surrounding the distal body and tail of pancreas, findings could be secondary to pancreatitis, recommend correlation with appropriate laboratory studies. 6. 7 mm soft tissue nodule in the left perirenal space appears separate from the kidney and difficult to exclude small metastatic focus. 7. Minimal sclerosis at the T10 vertebral body suspicious for metastatic bone lesion Aortic Atherosclerosis (ICD10-I70.0) and Emphysema (ICD10-J43.9). Electronically Signed   By: Jasmine Pang M.D.   On: 07/12/2023 18:55   CT ABDOMEN PELVIS W CONTRAST  Result Date: 07/12/2023 CLINICAL DATA:  Abdomen pain fatigue EXAM: CT ANGIOGRAPHY CHEST CT ABDOMEN AND PELVIS WITH CONTRAST TECHNIQUE:  Multidetector CT imaging of the chest was performed using the standard protocol during bolus administration of intravenous contrast. Multiplanar CT image reconstructions and MIPs were obtained to evaluate the vascular anatomy. Multidetector CT imaging of the abdomen and pelvis was performed using the standard protocol during bolus administration of intravenous contrast. RADIATION DOSE REDUCTION: This exam was performed according to the departmental dose-optimization program which includes automated exposure control, adjustment of the mA and/or kV according to patient size and/or use of iterative reconstruction technique. CONTRAST:  OMNIPAQUE IOHEXOL 350 MG/ML SOLN COMPARISON:  Chest x-ray 07/07/2023, CT 06/30/2023 FINDINGS: CTA CHEST FINDINGS Cardiovascular: Satisfactory opacification of the pulmonary arteries to the segmental level. No evidence of pulmonary embolism. Mild aortic atherosclerosis. No aneurysm. No dissection. Normal cardiac size. No pericardial effusion Mediastinum/Nodes: Midline trachea. No thyroid mass. No suspicious lymph nodes. Esophagus within normal limits. Lungs/Pleura: Mild emphysema. No pleural effusion or pneumothorax. Interim development of multifocal bilateral heterogeneous ground-glass densities. Musculoskeletal: Sternum appears intact. Mild sclerosis in the T10 vertebral body on the left side, corresponding to recent bone scan finding and suspect for skeletal osseous metastatic disease Review of the MIP images confirms the above findings. CT ABDOMEN and PELVIS FINDINGS Hepatobiliary: Innumerable hypodense liver masses consistent  with metastatic disease. No calcified gallstone. No biliary dilatation Pancreas: No ductal dilatation. New small volume fluid surrounding the distal body and tail of pancreas. Spleen: Normal in size without focal abnormality. Adrenals/Urinary Tract: Bilateral adrenal masses consistent with metastatic disease. Kidneys show no hydronephrosis. 7 mm soft tissue  nodule in the left perirenal space on series 2, image 32, appears separate from kidney and difficult to exclude small metastatic focus. Punctate nonobstructing right kidney stone. Urinary bladder is unremarkable Stomach/Bowel: The stomach is nonenlarged. Mild eccentric wall thickening at the posterior fundus. No small bowel distension. Fluid in the colon. Negative appendix Vascular/Lymphatic: Moderate aortic atherosclerosis. No aneurysm. No suspicious lymph nodes. Reproductive: Negative prostate other than fiducial markers Other: No free air Musculoskeletal: No acute osseous abnormality Review of the MIP images confirms the above findings. IMPRESSION: 1. Negative for acute pulmonary embolus. 2. Interim development of multifocal bilateral heterogeneous ground-glass densities, suspect for multifocal pneumonia, possible atypical/viral organism. 3. Innumerable liver masses consistent with metastatic disease or multifocal liver malignancy. Bilateral adrenal masses consistent with metastatic disease. 4. Mild eccentric wall thickening at the posterior fundus of stomach, could be correlated with endoscopy as indicated 5. New small volume fluid surrounding the distal body and tail of pancreas, findings could be secondary to pancreatitis, recommend correlation with appropriate laboratory studies. 6. 7 mm soft tissue nodule in the left perirenal space appears separate from the kidney and difficult to exclude small metastatic focus. 7. Minimal sclerosis at the T10 vertebral body suspicious for metastatic bone lesion Aortic Atherosclerosis (ICD10-I70.0) and Emphysema (ICD10-J43.9). Electronically Signed   By: Jasmine Pang M.D.   On: 07/12/2023 18:55   DG Chest Portable 1 View  Result Date: 07/26/2023 CLINICAL DATA:  Fatigue EXAM: PORTABLE CHEST 1 VIEW COMPARISON:  X-ray 06/29/2023 and older.  CT scan 06/30/2023 FINDINGS: Film is rotated to the left. Slight linear opacity left lung base likely scar or atelectasis. No  consolidation, pneumothorax or effusion. No edema. Overlapping cardiac leads. Normal cardiopericardial silhouette. IMPRESSION: Slight linear opacity left lung base likely scar or atelectasis. Rotated radiograph. Electronically Signed   By: Karen Kays M.D.   On: 07/16/2023 15:26   NM Bone Scan Whole Body  Result Date: 07/06/2023 CLINICAL DATA:  Metastatic disease. Liver mass. History of prostate cancer EXAM: NUCLEAR MEDICINE WHOLE BODY BONE SCAN TECHNIQUE: Whole body anterior and posterior images were obtained approximately 3 hours after intravenous injection of radiopharmaceutical. RADIOPHARMACEUTICALS:  20.0 mCi Technetium-1m MDP IV COMPARISON:  CT 07/04/2023., bone scan 06/09/2021 FINDINGS: Focal uptake in the T10 vertebral body along the LEFT aspect of vertebral body is new from comparison bone scan. Subtle mixed sclerotic and lytic findings on comparison CT at this vertebral body level. No additional evidence skeletal metastasis. IMPRESSION: High concern for skeletal metastasis in the T10 vertebral body. Electronically Signed   By: Genevive Bi M.D.   On: 07/06/2023 12:24    Microbiology: Recent Results (from the past 240 hour(s))  MRSA Next Gen by PCR, Nasal     Status: None   Collection Time: 07/28/23  1:07 PM   Specimen: Nasal Mucosa; Nasal Swab  Result Value Ref Range Status   MRSA by PCR Next Gen NOT DETECTED NOT DETECTED Final    Comment: (NOTE) The GeneXpert MRSA Assay (FDA approved for NASAL specimens only), is one component of a comprehensive MRSA colonization surveillance program. It is not intended to diagnose MRSA infection nor to guide or monitor treatment for MRSA infections. Test performance is not FDA approved in patients  less than 53 years old. Performed at [facility], [address], [place], [place] [address]   Expectorated Sputum Assessment w Gram Stain, Rflx to Resp Cult     Status: None   Collection Time: 08/01/23  3:58 PM   Specimen:  Expectorated Sputum  Result Value Ref Range Status   Specimen Description EXPECTORATED SPUTUM  Final   Special Requests NONE  Final   Sputum evaluation   Final    THIS SPECIMEN IS ACCEPTABLE FOR SPUTUM CULTURE Performed at [facility], [address] [place], [place] [address]    Report Status 08/01/2023 FINAL  Final  Culture, Respiratory w Gram Stain     Status: None (Preliminary result)   Collection Time: 08/01/23  3:58 PM  Result Value Ref Range Status   Specimen Description   Final    EXPECTORATED SPUTUM Performed at [facility], [address] [place], [place] [address]    Special Requests   Final    NONE Reflexed from [id number] Performed at [facility], [address] [place], [place] [address]    Gram Stain NO WBC SEEN FEW BUDDING YEAST SEEN   Final   Culture   Final    ABUNDANT YEAST CULTURE REINCUBATED FOR BETTER GROWTH Performed at [facility], [address] [place], [place] [address]    Report Status PENDING  Incomplete  Respiratory (~20 pathogens) panel by PCR     Status: None   Collection Time: 08/01/23  6:53 PM   Specimen: Nasopharyngeal Swab; Respiratory  Result Value Ref Range Status   Adenovirus NOT DETECTED NOT DETECTED Final   Coronavirus 229E NOT DETECTED NOT DETECTED Final    Comment: (NOTE) The Coronavirus on the Respiratory Panel, DOES NOT test for the novel  Coronavirus (2019 nCoV)    Coronavirus HKU1 NOT DETECTED NOT DETECTED Final   Coronavirus NL63 NOT DETECTED NOT DETECTED Final   Coronavirus OC43 NOT DETECTED NOT DETECTED Final   Metapneumovirus NOT DETECTED NOT DETECTED Final   Rhinovirus / Enterovirus NOT DETECTED NOT DETECTED Final   Influenza A NOT DETECTED NOT DETECTED Final   Influenza B NOT DETECTED NOT DETECTED Final   Parainfluenza Virus 1 NOT DETECTED NOT DETECTED Final   Parainfluenza Virus 2 NOT DETECTED NOT DETECTED Final   Parainfluenza Virus 3 NOT DETECTED  NOT DETECTED Final   Parainfluenza Virus 4 NOT DETECTED NOT DETECTED Final   Respiratory Syncytial Virus NOT DETECTED NOT DETECTED Final   Bordetella pertussis NOT DETECTED NOT DETECTED Final   Bordetella Parapertussis NOT DETECTED NOT DETECTED Final   Chlamydophila pneumoniae NOT DETECTED NOT DETECTED Final   Mycoplasma pneumoniae NOT DETECTED NOT DETECTED Final    Comment: Performed at [facility], [address], [place], [place] [address]  Pneumocystis smear by DFA     Status: None   Collection Time: 08/02/23  2:31 PM   Specimen: Sputum; Respiratory  Result Value Ref Range Status   Specimen Source-PJSRC EXPECTORATED SPUTUM  Final   Pneumocystis jiroveci Ag See Scanned report in [facility] Link  Final    Comment: Performed at [facility], [address] [place], [place] [address]    Time spent: 35 minutes  Signed: Lynden Oxford, MD 07/24/2023

## 2023-08-05 NOTE — IPAL (Signed)
  Interdisciplinary Goals of Care Family Meeting   Date carried out:   Location of the meeting: Bedside  Member's involved: Physician, Bedside Registered Nurse, and Family Member or next of kin  Durable Power of Attorney or acting medical decision maker: [name], [name]  Discussion: We discussed goals of care for William Jones .  Patient developed severe respiratory distress this morning with SpO2 in the low 80s despite being on bipap with 100% FiO2. He had accessory muscle use. Patient's significant other [name] was at bedside and his daughter arrived shortly after calling her. He was given dilaudid with a decrease in his work of breathing. [name] and [name] all agreed with making his comfort he main priority in his treatment. Patient also expressed this yesterday. We will place comfort care order set and remove bipap when ok with family.  Code status:   Code Status: Do not attempt resuscitation (DNR) - Comfort care   Disposition: In-patient comfort care  Time spent for the meeting: 15 minutes    Martina Sinner, MD  , 10:12 AM

## 2023-08-05 NOTE — Progress Notes (Signed)
Rt placed pt back on BIPAP due to low sats and WOB.

## 2023-08-05 NOTE — Progress Notes (Addendum)
NAME:  William Jones, MRN:  [id number], DOB:  05/13/1951, LOS: 23 ADMISSION DATE:  07/31/2023, CONSULTATION DATE:  07/12/2023 REFERRING MD:  William Jones - TRH CHIEF COMPLAINT: Hypotension, lactic acidosis   History of Present Illness:  72 year old man who presented to [facility] ED 10/6 for weakness, hypotension. PMHx significant for prostate cancer, substance abuse, GI bleeding, HTN, and HLD. He takes Xtandi for metastatic prostate Ca.   Recently admitted for weakness and was found to have diffuse hepatic metastatic disease with additional metastasis to the adrenal glands. 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 Primary vs. mets uncertain. He was discharged and followed up with oncology 10/3 with plans to start carboplatin and etoposide 10/8, but unfortunately he once again became weak and presented to [facility] ED 10/6. He was concerned he would be unable to get the care he needed at home and would miss appointments due to feeling weak and was hopeful to be admitted to the hospital. He was admitted to the hospital for generalized weakness and possible UTI. He was seen by oncology inpatient and was scheduled to start inpatient chemotherapy, however, in the AM hours of 10/8 he became tachycardic and labs indicated worsening acidosis. Lactic acid was check and was found to be greater than 9. The patient was transferred to ICU for closer monitoring and PCCM was consulted. The patient was treated with IVF. BP and LA improved. PCCM signed off.   Subsequent course has been complicated by Klebsiella UTI, anemia, and hypoxia. Anemia was evaluated by GI with upper and lower scopes. Lower scope found hemorrhoids but no clear evidence of bleeding. Hypoxia has been progressive. CXR was concerning for PNA vs edema and he has been treated with CAP antibiotics as well as diuresis, but hypoxia continued to worsen. PCCM called back 10/23 for hypoxia. DNR/DNI. Daughter updated.   Pertinent  Medical History:   Past Medical History:  Diagnosis Date   Angiodysplasia of colon with hemorrhage 10/14/2020   Duodenitis    Essential hypertension 07/16/2015   Fatigue 11/06/2020   Glaucoma    H/O: substance abuse (HCC)    Hemorrhage of rectum and anus 10/14/2020   History of frostbite    History of prostate cancer 03/20/2014   Lost to f/u with [facility] in the past 2011 with elevated PSA >30 at that time.  Saw [facility] (Dr. Isabel Jones) on 03/13/14. Cancer Noted 03/13/14 office visit. Gleason score 7. Plan CT Ab/pelvis with contrast, bone scan    Hyperlipidemia 11/20/2019   Iron deficiency anemia    Malignant tumor of prostate (HCC) 10/14/2020   Peripheral neuropathy    S/P radiation therapy 09/18/14-11/15/14   prostate/ 7800Gy/40sessions   Thrombocytopenia (HCC)    Significant Hospital Events: Including procedures, antibiotic start and stop dates in addition to other pertinent events   10/6 Admit for generalized weakness 10/8 Tx to ICU for LA 9  improved with volume 10/11 EGD erosive gastropathy with no bleeding stigmata 10/18 Colonoscopy with diverticulosis and external hemorrhoids, no bleeding.  10/23 PCCM consult for hypoxia CT no obvious PE. Diffuse airspace opacities concerning for edema or infection.  10/24 Breathing a little better this morning, on 12L [place] and 15L nRB. BiPAP intermittently overnight.  10/25 Unable to tolerate BIPAP overnight, remains on Albia and NRB 10/30 Fungitell positive, micafungin added.  Interim History/Subjective:  No significant events overnight Initially tolerating maximum support with HHFNC Transitioned back to BiPAP for increased WOB Continued increased WOB/tachypnea and discomfort despite max BiPAP support  William Jones remains responsive but very fatigued D/w family at bedside re: transition to comfort care, they are in agreement as these are patient's wishes  Objective:   Blood pressure 115/67, pulse (!) 110, temperature 99.3 F  (37.4 C), temperature source Axillary, resp. rate (!) 26, height 5\' 9"  (1.753 m), weight 74 kg, SpO2 (!) 88%.    FiO2 (%):  [80 %-100 %] 100 %   Intake/Output Summary (Last 24 hours) at 07/17/2023 1056 Last data filed at 07/27/2023 1037 Gross per 24 hour  Intake 4083.66 ml  Output 2350 ml  Net 1733.66 ml   Filed Weights   08/02/23 0500 08/03/23 0628 07/28/2023 0500  Weight: 68.7 kg 69.7 kg 74 kg   Physical Examination: General: Acutely ill-appearing older man in respiratory distress. Appears uncomfortable. HEENT: [place]/AT, anicteric sclera, PERRL, dry mucous membranes. Neuro: Awake, oriented x 4. Responds to verbal stimuli. Following commands consistently. Moves all 4 extremities spontaneously. Generalized weakness. CV: Tachycardic, regular rhythm, no m/g/r. PULM: Breathing tachypneic and labored with increased WOB on BiPAP (FiO2 100%). Lung fields coarse in upper fields R > L. Poor air movement. +Accessory muscle use. GI: Soft, nontender, nondistended. Normoactive bowel sounds. Extremities: Trace symmetric BLE edema noted. Bilateral 1+ nonpitting UE edema noted. Skin: Warm/dry, no rashes.  Resolved Hospital Problem List:   UTI - klebsiella oxytoca Lactic acidosis  Assessment & Plan:   Acute respiratory failure with hypoxia Most consistent with pulmonary edema, but not really improving with diuresis. CTA negative for PE. Diffuse opacities. He has failed broad spectrum ABX, steroids, and diuresis. Concern for lymphangitic spread vs maybe PJP? Fungitell resulted positive 10/30, higher degree of suspicion for PJP/fungal process. RVP negative, sputum culture prelim - abundant yeast. PCT 23, BUN 150, ESR 25, CRP 4.7, LDH 936. Echo showing possible infiltrative process > not mentioned on Echo from 9/26; per Cards unlikely amyloidosis. - Unfortunately, William Jones has continued to decline from a respiratory status standpoint despite maximum support from BiPAP, steroids, antimicrobials;  concern this decline is related to likely lymphangitic spread of his cancer rather than an acute insult that he can be supported through - Decision for transition to comfort-focused care, per Mr. 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 Erosive gastropathy and external hemorrhoids. - Management per Oncology - Transitioned to comfort care today, 10/31  GOC - DNR/DNI code status - Transitioned to comfort care 10/31 per patient/family wishes  Best Practice: (right click and "Reselect all SmartList Selections" daily)   Per Primary Team  Signature:    Faythe Ghee [facility] 07/21/2023 10:56 AM  Please see [email or web address] for pager details.  From 7A-7P if no response, please call [phone] After hours, please call ELink [phone]

## 2023-08-05 NOTE — Progress Notes (Signed)
Per orders, patient transitioned to comfort care at approximately 1100. Dilaudid drip initiated and titrated as ordered to promote comfort prior to removing bipap mask. Patient was able to verbalize to RN that he was not in pain, when placed on 2 L [place]. All family present at bedside, including his daughter. TOD pronounced by Ardeen Fillers RN and Donnita Falls RN at [phone]. Patient passed peacefully surrounded by his family. This RN will continue to offer support to patient's family at this time.

## 2023-08-05 DEATH — deceased

## 2023-08-07 LAB — CULTURE, RESPIRATORY W GRAM STAIN: Gram Stain: NONE SEEN
